# Patient Record
Sex: Female | Born: 1960 | Race: White | Hispanic: No | Smoking: Former smoker
Health system: Southern US, Community
[De-identification: ages and names within clinical notes are randomized; demographics above are authoritative.]

## PROBLEM LIST (undated history)

## (undated) DIAGNOSIS — M1990 Unspecified osteoarthritis, unspecified site: Secondary | ICD-10-CM

## (undated) DIAGNOSIS — K760 Fatty (change of) liver, not elsewhere classified: Secondary | ICD-10-CM

## (undated) DIAGNOSIS — F419 Anxiety disorder, unspecified: Secondary | ICD-10-CM

## (undated) DIAGNOSIS — I7300 Raynaud's syndrome without gangrene: Secondary | ICD-10-CM

## (undated) DIAGNOSIS — S6292XA Unspecified fracture of left wrist and hand, initial encounter for closed fracture: Secondary | ICD-10-CM

## (undated) DIAGNOSIS — R002 Palpitations: Secondary | ICD-10-CM

## (undated) DIAGNOSIS — R202 Paresthesia of skin: Secondary | ICD-10-CM

## (undated) DIAGNOSIS — G4700 Insomnia, unspecified: Secondary | ICD-10-CM

## (undated) DIAGNOSIS — G43909 Migraine, unspecified, not intractable, without status migrainosus: Secondary | ICD-10-CM

## (undated) DIAGNOSIS — H6690 Otitis media, unspecified, unspecified ear: Secondary | ICD-10-CM

## (undated) DIAGNOSIS — E785 Hyperlipidemia, unspecified: Secondary | ICD-10-CM

## (undated) DIAGNOSIS — G4733 Obstructive sleep apnea (adult) (pediatric): Secondary | ICD-10-CM

## (undated) DIAGNOSIS — M8700 Idiopathic aseptic necrosis of unspecified bone: Secondary | ICD-10-CM

## (undated) DIAGNOSIS — M797 Fibromyalgia: Secondary | ICD-10-CM

## (undated) DIAGNOSIS — R200 Anesthesia of skin: Secondary | ICD-10-CM

## (undated) DIAGNOSIS — H269 Unspecified cataract: Secondary | ICD-10-CM

## (undated) DIAGNOSIS — M549 Dorsalgia, unspecified: Secondary | ICD-10-CM

## (undated) DIAGNOSIS — E559 Vitamin D deficiency, unspecified: Secondary | ICD-10-CM

## (undated) DIAGNOSIS — K635 Polyp of colon: Secondary | ICD-10-CM

## (undated) DIAGNOSIS — E538 Deficiency of other specified B group vitamins: Secondary | ICD-10-CM

## (undated) DIAGNOSIS — F32A Depression, unspecified: Secondary | ICD-10-CM

## (undated) DIAGNOSIS — G629 Polyneuropathy, unspecified: Secondary | ICD-10-CM

## (undated) DIAGNOSIS — E669 Obesity, unspecified: Secondary | ICD-10-CM

## (undated) DIAGNOSIS — H409 Unspecified glaucoma: Secondary | ICD-10-CM

## (undated) DIAGNOSIS — K219 Gastro-esophageal reflux disease without esophagitis: Secondary | ICD-10-CM

## (undated) DIAGNOSIS — R1310 Dysphagia, unspecified: Secondary | ICD-10-CM

## (undated) DIAGNOSIS — K0889 Other specified disorders of teeth and supporting structures: Secondary | ICD-10-CM

## (undated) DIAGNOSIS — Z9889 Other specified postprocedural states: Secondary | ICD-10-CM

## (undated) DIAGNOSIS — T7840XA Allergy, unspecified, initial encounter: Secondary | ICD-10-CM

## (undated) DIAGNOSIS — T4145XA Adverse effect of unspecified anesthetic, initial encounter: Secondary | ICD-10-CM

## (undated) DIAGNOSIS — T8859XA Other complications of anesthesia, initial encounter: Secondary | ICD-10-CM

## (undated) DIAGNOSIS — R0602 Shortness of breath: Secondary | ICD-10-CM

## (undated) DIAGNOSIS — G35D Multiple sclerosis, unspecified: Secondary | ICD-10-CM

## (undated) DIAGNOSIS — G709 Myoneural disorder, unspecified: Secondary | ICD-10-CM

## (undated) DIAGNOSIS — H5120 Internuclear ophthalmoplegia, unspecified eye: Secondary | ICD-10-CM

## (undated) DIAGNOSIS — G4730 Sleep apnea, unspecified: Secondary | ICD-10-CM

## (undated) DIAGNOSIS — K5790 Diverticulosis of intestine, part unspecified, without perforation or abscess without bleeding: Secondary | ICD-10-CM

## (undated) DIAGNOSIS — H469 Unspecified optic neuritis: Secondary | ICD-10-CM

## (undated) DIAGNOSIS — R42 Dizziness and giddiness: Secondary | ICD-10-CM

## (undated) DIAGNOSIS — R079 Chest pain, unspecified: Secondary | ICD-10-CM

## (undated) DIAGNOSIS — R600 Localized edema: Secondary | ICD-10-CM

## (undated) DIAGNOSIS — I38 Endocarditis, valve unspecified: Secondary | ICD-10-CM

## (undated) DIAGNOSIS — M799 Soft tissue disorder, unspecified: Secondary | ICD-10-CM

## (undated) DIAGNOSIS — M2550 Pain in unspecified joint: Secondary | ICD-10-CM

## (undated) DIAGNOSIS — R7303 Prediabetes: Secondary | ICD-10-CM

## (undated) DIAGNOSIS — Z91018 Allergy to other foods: Secondary | ICD-10-CM

## (undated) DIAGNOSIS — R5382 Chronic fatigue, unspecified: Secondary | ICD-10-CM

## (undated) DIAGNOSIS — C801 Malignant (primary) neoplasm, unspecified: Secondary | ICD-10-CM

## (undated) DIAGNOSIS — N760 Acute vaginitis: Secondary | ICD-10-CM

## (undated) DIAGNOSIS — G9332 Myalgic encephalomyelitis/chronic fatigue syndrome: Secondary | ICD-10-CM

## (undated) DIAGNOSIS — E739 Lactose intolerance, unspecified: Secondary | ICD-10-CM

## (undated) DIAGNOSIS — G35 Multiple sclerosis: Secondary | ICD-10-CM

## (undated) HISTORY — DX: Unspecified fracture of left hand, initial encounter for closed fracture: S62.92XA

## (undated) HISTORY — PX: BREAST BIOPSY: SHX20

## (undated) HISTORY — DX: Palpitations: R00.2

## (undated) HISTORY — DX: Other specified disorders of teeth and supporting structures: K08.89

## (undated) HISTORY — DX: Vitamin D deficiency, unspecified: E55.9

## (undated) HISTORY — DX: Anxiety disorder, unspecified: F41.9

## (undated) HISTORY — DX: Dizziness and giddiness: R42

## (undated) HISTORY — DX: Unspecified glaucoma: H40.9

## (undated) HISTORY — DX: Idiopathic aseptic necrosis of unspecified bone: M87.00

## (undated) HISTORY — DX: Allergy, unspecified, initial encounter: T78.40XA

## (undated) HISTORY — DX: Unspecified optic neuritis: H46.9

## (undated) HISTORY — DX: Polyneuropathy, unspecified: G62.9

## (undated) HISTORY — DX: Lactose intolerance, unspecified: E73.9

## (undated) HISTORY — DX: Insomnia, unspecified: G47.00

## (undated) HISTORY — DX: Fatty (change of) liver, not elsewhere classified: K76.0

## (undated) HISTORY — DX: Pain in unspecified joint: M25.50

## (undated) HISTORY — DX: Myalgic encephalomyelitis/chronic fatigue syndrome: G93.32

## (undated) HISTORY — DX: Anesthesia of skin: R20.2

## (undated) HISTORY — DX: Prediabetes: R73.03

## (undated) HISTORY — DX: Dysphagia, unspecified: R13.10

## (undated) HISTORY — DX: Malignant (primary) neoplasm, unspecified: C80.1

## (undated) HISTORY — DX: Multiple sclerosis: G35

## (undated) HISTORY — DX: Sleep apnea, unspecified: G47.30

## (undated) HISTORY — DX: Myoneural disorder, unspecified: G70.9

## (undated) HISTORY — DX: Internuclear ophthalmoplegia, unspecified eye: H51.20

## (undated) HISTORY — DX: Raynaud's syndrome without gangrene: I73.00

## (undated) HISTORY — DX: Allergy to other foods: Z91.018

## (undated) HISTORY — DX: Dorsalgia, unspecified: M54.9

## (undated) HISTORY — DX: Localized edema: R60.0

## (undated) HISTORY — DX: Gastro-esophageal reflux disease without esophagitis: K21.9

## (undated) HISTORY — PX: CHOLECYSTECTOMY: SHX55

## (undated) HISTORY — PX: COLONOSCOPY: SHX174

## (undated) HISTORY — PX: LAPAROSCOPIC ABDOMINAL EXPLORATION: SHX6249

## (undated) HISTORY — PX: ENDOMETRIAL ABLATION: SHX621

## (undated) HISTORY — DX: Obstructive sleep apnea (adult) (pediatric): G47.33

## (undated) HISTORY — DX: Shortness of breath: R06.02

## (undated) HISTORY — DX: Chest pain, unspecified: R07.9

## (undated) HISTORY — DX: Unspecified cataract: H26.9

## (undated) HISTORY — DX: Unspecified osteoarthritis, unspecified site: M19.90

## (undated) HISTORY — PX: TOTAL HIP ARTHROPLASTY: SHX124

## (undated) HISTORY — DX: Endocarditis, valve unspecified: I38

## (undated) HISTORY — DX: Migraine, unspecified, not intractable, without status migrainosus: G43.909

## (undated) HISTORY — DX: Fibromyalgia: M79.7

## (undated) HISTORY — DX: Multiple sclerosis, unspecified: G35.D

## (undated) HISTORY — DX: Deficiency of other specified B group vitamins: E53.8

## (undated) HISTORY — DX: Otitis media, unspecified, unspecified ear: H66.90

## (undated) HISTORY — DX: Chronic fatigue, unspecified: R53.82

## (undated) HISTORY — DX: Depression, unspecified: F32.A

## (undated) HISTORY — DX: Obesity, unspecified: E66.9

## (undated) HISTORY — PX: BREAST EXCISIONAL BIOPSY: SUR124

## (undated) HISTORY — DX: Hyperlipidemia, unspecified: E78.5

## (undated) HISTORY — DX: Acute vaginitis: N76.0

## (undated) HISTORY — DX: Anesthesia of skin: R20.0

## (undated) HISTORY — PX: JOINT REPLACEMENT: SHX530

## (undated) HISTORY — DX: Soft tissue disorder, unspecified: M79.9

## (undated) HISTORY — PX: DILATION AND CURETTAGE OF UTERUS: SHX78

## (undated) HISTORY — DX: Diverticulosis of intestine, part unspecified, without perforation or abscess without bleeding: K57.90

## (undated) HISTORY — PX: EYE SURGERY: SHX253

## (undated) HISTORY — PX: LUMBAR PUNCTURE: SHX1985

---
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
## 1997-12-23 ENCOUNTER — Ambulatory Visit (HOSPITAL_COMMUNITY): Admission: RE | Admit: 1997-12-23 | Discharge: 1997-12-23 | Payer: Self-pay | Admitting: *Deleted

## 1998-03-07 ENCOUNTER — Ambulatory Visit: Admission: RE | Admit: 1998-03-07 | Discharge: 1998-03-07 | Payer: Self-pay | Admitting: Neurology

## 1999-03-23 ENCOUNTER — Encounter: Admission: RE | Admit: 1999-03-23 | Discharge: 1999-06-21 | Payer: Self-pay | Admitting: Neurology

## 2000-06-17 ENCOUNTER — Encounter: Admission: RE | Admit: 2000-06-17 | Discharge: 2000-09-15 | Payer: Self-pay | Admitting: *Deleted

## 2001-04-25 ENCOUNTER — Other Ambulatory Visit: Admission: RE | Admit: 2001-04-25 | Discharge: 2001-04-25 | Payer: Self-pay | Admitting: *Deleted

## 2002-08-10 ENCOUNTER — Encounter: Admission: RE | Admit: 2002-08-10 | Discharge: 2002-08-10 | Payer: Self-pay | Admitting: Internal Medicine

## 2002-08-10 ENCOUNTER — Encounter: Payer: Self-pay | Admitting: Internal Medicine

## 2003-04-22 ENCOUNTER — Inpatient Hospital Stay (HOSPITAL_COMMUNITY): Admission: RE | Admit: 2003-04-22 | Discharge: 2003-04-25 | Payer: Self-pay | Admitting: Orthopedic Surgery

## 2004-09-28 ENCOUNTER — Other Ambulatory Visit: Admission: RE | Admit: 2004-09-28 | Discharge: 2004-09-28 | Payer: Self-pay | Admitting: Obstetrics and Gynecology

## 2007-08-22 ENCOUNTER — Emergency Department (HOSPITAL_COMMUNITY): Admission: EM | Admit: 2007-08-22 | Discharge: 2007-08-22 | Payer: Self-pay | Admitting: Emergency Medicine

## 2008-01-12 ENCOUNTER — Encounter: Admission: RE | Admit: 2008-01-12 | Discharge: 2008-01-12 | Payer: Self-pay | Admitting: Internal Medicine

## 2008-12-02 ENCOUNTER — Encounter: Admission: RE | Admit: 2008-12-02 | Discharge: 2008-12-02 | Payer: Self-pay | Admitting: Obstetrics and Gynecology

## 2008-12-11 ENCOUNTER — Encounter (INDEPENDENT_AMBULATORY_CARE_PROVIDER_SITE_OTHER): Payer: Self-pay | Admitting: Diagnostic Radiology

## 2008-12-11 ENCOUNTER — Encounter: Admission: RE | Admit: 2008-12-11 | Discharge: 2008-12-11 | Payer: Self-pay | Admitting: Obstetrics and Gynecology

## 2009-01-21 ENCOUNTER — Encounter: Admission: RE | Admit: 2009-01-21 | Discharge: 2009-01-21 | Payer: Self-pay | Admitting: Surgery

## 2009-01-21 ENCOUNTER — Encounter (INDEPENDENT_AMBULATORY_CARE_PROVIDER_SITE_OTHER): Payer: Self-pay | Admitting: Surgery

## 2009-01-21 ENCOUNTER — Ambulatory Visit (HOSPITAL_COMMUNITY): Admission: RE | Admit: 2009-01-21 | Discharge: 2009-01-21 | Payer: Self-pay | Admitting: Surgery

## 2009-09-23 ENCOUNTER — Encounter (INDEPENDENT_AMBULATORY_CARE_PROVIDER_SITE_OTHER): Payer: Self-pay | Admitting: *Deleted

## 2009-12-29 ENCOUNTER — Encounter: Admission: RE | Admit: 2009-12-29 | Discharge: 2009-12-29 | Payer: Self-pay | Admitting: Obstetrics and Gynecology

## 2010-01-02 ENCOUNTER — Encounter: Admission: RE | Admit: 2010-01-02 | Discharge: 2010-01-02 | Payer: Self-pay | Admitting: Obstetrics and Gynecology

## 2010-05-10 HISTORY — PX: ENDOMETRIAL ABLATION: SHX621

## 2010-05-31 ENCOUNTER — Encounter: Payer: Self-pay | Admitting: Obstetrics and Gynecology

## 2010-06-01 ENCOUNTER — Other Ambulatory Visit: Payer: Self-pay | Admitting: Dermatology

## 2010-06-11 NOTE — Letter (Signed)
Summary: Colonoscopy Date Change Letter  [facility]  [address] [place], [place] [address]   Phone: [phone]  Fax: [phone]      Sep 23, 2009 MRN: [id number]   Grace [name] [address] [place], [place]  [address]   Dear [name],   Previously you were recommended to have a repeat colonoscopy around this time. Your chart was recently reviewed by Dr. Claudette Head of [facility]. Follow up colonoscopy is now recommended in December 2014. This revised recommendation is based on current, nationally recognized guidelines for colorectal cancer screening and polyp surveillance. These guidelines are endorsed by the American Cancer Society, [employer] on Colorectal Cancer as well as numerous other major medical organizations.  Please understand that our recommendation assumes that you do not have any new symptoms such as bleeding, a change in bowel habits, anemia, or significant abdominal discomfort. If you do have any concerning GI symptoms or want to discuss the guideline recommendations, please call to arrange an office visit at your earliest convenience. Otherwise we will keep you in our reminder system and contact you 1-2 months prior to the date listed above to schedule your next colonoscopy.  Thank you,  Judie Petit T. Russella Dar, M.D.  [facility] [phone]

## 2010-08-14 LAB — COMPREHENSIVE METABOLIC PANEL
ALT: 15 U/L (ref 0–35)
AST: 16 U/L (ref 0–37)
Albumin: 4.1 g/dL (ref 3.5–5.2)
Alkaline Phosphatase: 66 U/L (ref 39–117)
BUN: 13 mg/dL (ref 6–23)
CO2: 23 mEq/L (ref 19–32)
Calcium: 9.5 mg/dL (ref 8.4–10.5)
Chloride: 107 mEq/L (ref 96–112)
Creatinine, Ser: 0.7 mg/dL (ref 0.4–1.2)
GFR calc Af Amer: >60 mL/min (ref >60)
GFR calc non Af Amer: >60 mL/min (ref >60)
Glucose, Bld: 112 mg/dL — ABNORMAL HIGH (ref 70–99)
Potassium: 4.3 mEq/L (ref 3.5–5.1)
Sodium: 137 mEq/L (ref 135–145)
Total Bilirubin: 0.6 mg/dL (ref 0.3–1.2)
Total Protein: 7 g/dL (ref 6.0–8.3)

## 2010-08-14 LAB — DIFFERENTIAL
Basophils Absolute: 0 10*3/uL (ref 0.0–0.1)
Basophils Relative: 1 % (ref 0–1)
Eosinophils Absolute: 0.2 10*3/uL (ref 0.0–0.7)
Eosinophils Relative: 2 % (ref 0–5)
Lymphocytes Relative: 32 % (ref 12–46)
Lymphs Abs: 2.8 10*3/uL (ref 0.7–4.0)
Monocytes Absolute: 0.6 10*3/uL (ref 0.1–1.0)
Monocytes Relative: 7 % (ref 3–12)
Neutro Abs: 5.1 10*3/uL (ref 1.7–7.7)
Neutrophils Relative %: 58 % (ref 43–77)

## 2010-08-14 LAB — CBC
HCT: 39.4 % (ref 36.0–46.0)
Hemoglobin: 13.6 g/dL (ref 12.0–15.0)
MCHC: 34.5 g/dL (ref 30.0–36.0)
MCV: 95.3 fL (ref 78.0–100.0)
Platelets: 298 10*3/uL (ref 150–400)
RBC: 4.13 MIL/uL (ref 3.87–5.11)
RDW: 13.4 % (ref 11.5–15.5)
WBC: 8.8 10*3/uL (ref 4.0–10.5)

## 2010-09-25 NOTE — Op Note (Signed)
NAME:  DUCHESS, ARMENDAREZ NO.:  [id number]   MEDICAL RECORD NO.:  [id number]                   PATIENT TYPE:  INP   LOCATION:  2907                                 FACILITY:  [facility]   PHYSICIAN:  Elana Alm. Thurston Hole, M.D.              DATE OF BIRTH:  17-Aug-1960   DATE OF PROCEDURE:  04/22/2003  DATE OF DISCHARGE:                                 OPERATIVE REPORT   PREOPERATIVE DIAGNOSIS:  Right hip avascular necrosis.   POSTOPERATIVE DIAGNOSIS:  Right hip avascular necrosis.   PROCEDURE:  Right total hip replacement using [employer] ceramic total hip  system with 50 mm Press Fit Trident acetabulum with ceramic liner with #8  Press Fit, Secure-Fit Plus, femoral component with +0 x 30 mm ceramic head.   SURGEON:  Elana Alm. Thurston Hole, M.D.   ASSISTANT:  Julien Girt, P.A.   ANESTHESIA:  General.   OPERATIVE TIME:  One hour 20 minutes.   ESTIMATED BLOOD LOSS:  350 mL.   COMPLICATIONS:  None.   DESCRIPTION OF PROCEDURE:  Ms. Gorczyca was brought to the operating room on  April 22, 2003, placed on the operative table in the supine position.  After an adequate level of general anesthesia was obtained, her right hip  was examined.  She had flexion to 90, extension to 0, internal and external  rotation to 30 degrees.  Leg lengths were approximately equal.  She had a  Foley catheter placed under sterile conditions and received Ancef 1 g IV  preoperatively for prophylaxis.  She was then placed in the right lateral up  decubitus position and secured on the bed with the [place] frame.  The right  hip and leg was prepped using sterile DuraPrep and draped using sterile  technique.  Initially through a 15 cm posterior lateral greater trochanteric  incision, initial exposure was made.  The underlying subcutaneous tissues  were incised along with the skin incision.  The iliotibial band and gluteus  maximus fascia was incised longitudinally revealing the sciatic nerve  which  was carefully protected.  Short external rotators of the hip and hip capsule  were released off the femoral neck insertions and tagged.  The hip was then  posteriorly dislocated.  The femoral head was found to be somewhat soft with  grade 3 and 4 changes consistent with avascular necrosis.  A femoral neck  cut was made 1.2-2.0 cm above the lesser trochanter and the appropriate  amount of anteversion and inclination.  The acetabulum was then exposed.  The labrum was removed from around the edge of the acetabulum with carefully  placed retractors.  Sequential acetabular reamers were used to ream up to a  50 mm size and the appropriate amount of anteversion and abduction and  inclination.  Then a 50 mm trial was placed.  This was found to give an  excellent fit, and this was removed and then the  actual 50 mm PSL.  A  Trident cup was hammered into position with an excellent [employer], in the  appropriate amount of anteversion and abduction and inclination.  Further  fixation was used with two separate 20 mm screws, one in the 12 o'clock and  one in the 11 o'clock position.  After this was done, then a 0 degree  ceramic insert was placed and hammered into position with an excellent fit.  After this was done, the proximal femur was exposed.  Sequential axial  reamers were used to ream up to a #8 size followed by broaches to a #8 size.  With a #8 broach in place and a +0 x 32 mm femoral head, the femoral trial  was reduced, taken through a full range of motion and found to be stable up  to 80 degrees of internal rotation in both neutral and 30 degrees of  adduction and also stable in adduction and external rotation.  Leg lengths  were also equal.  The femoral trial was then removed.  The distal femoral  canal was reamed to a 12.5 mm size.  The femoral canal was then irrigated  and the actual #8 Secure Fit Plus component was hammered into position with  an excellent fit.  A +0 x 32 mm  ceramic head was hammered into the femoral  neck with an excellent morse taper fit.  The hip was reduced.  It was found  to be stable through a full range of motion.  The leg lengths were equal as  well.  At this point, the wound was irrigated and then the short external  rotator of the hip and hip capsule were closed and reattached to the femoral  neck insertion through two drill holes.  The iliotibial band and gluteus  maximus fascia was closed with #1 Ethibond sutures.  Subcutaneous tissue was  closed with 0 and 2-0 Vicryl.  Skin was closed with skin staples.  Sterile  dressings were applied.  The patient was then turned supine, checked for leg  lengths which were equal.  Rotation was equal.  Neurovascular status was  normal.  The patient was then awakened, extubated and taken to the recovery  room in stable condition.  Needle and sponge counts were correct x2 at the  end of the case.                                               Robert A. Thurston Hole, M.D.    RAW/MEDQ  D:  04/22/2003  T:  04/23/2003  Job:  [id number]

## 2010-09-25 NOTE — Discharge Summary (Signed)
NAME:  Grace [name], Grace [name] NO.:  [id number]   MEDICAL RECORD NO.:  [id number]                   PATIENT TYPE:  INP   LOCATION:  5004                                 FACILITY:  [facility]   PHYSICIAN:  Elana Alm. Thurston Hole, M.D.              DATE OF BIRTH:  01-31-1961   DATE OF ADMISSION:  04/22/2003  DATE OF DISCHARGE:  04/25/2003                                 DISCHARGE SUMMARY   ADMISSION DIAGNOSES:  1. Avascular necrosis right hip.  2. Multiple sclerosis.  3. Sleep apnea.  4. High cholesterol.   DISCHARGE DIAGNOSES:  1. Avascular necrosis status post right total hip replacement.  2. Multiple sclerosis.  3. Sleep apnea.  4. High cholesterol.   HISTORY OF PRESENT ILLNESS:  The patient is a 50 year old female who has  been on chronic steroid suppression of her multiple sclerosis who has gone  on to develop avascular necrosis of both hips.  She had a left total hip  replacement done at [facility] in June of 2002 and comes in today to have her  right total hip replaced.  She understands risks, benefits, and possible  complications of a total hip replacement and is without question.   PROCEDURES IN HOUSE:  On April 22, 2003, the patient underwent a right  total hip replacement by Dr. Thurston Hole.  She tolerated the procedure well.   HOSPITAL COURSE:  She was admitted postoperatively for pain control, DVT  prophylaxis, and physical therapy.  On postop day #1 T-max of 100.6, pulse  was 106, hemoglobin was 10.3, she was metabolically stable, her PCA was  discharged, her telemetry was discharged, she was placed on Dilaudid 2 mg  one to two q.4 h. p.r.n. pain.  She was transferred to the orthopedic floor.  Postop day #2 surgical wound was well approximated.  She had very little  drainage.  Physical therapy was ordered to have her up with ambulation.  Postop day #3 she was doing very well.  Her surgical wound was well  approximated and healing.  Her hemoglobin was 9.7, her  INR was 2.1 and she  was metabolically stable.  She was discharged to home in stable condition,  touchdown weightbearing, on a regular diet.   DISCHARGE MEDICATIONS:  1. Percocet one to two q.4-6 h. p.r.n. pain.  2. Robaxin 500 mg one q.6 h. p.r.n. spasms.  3. Coumadin 5 mg take as directed.  4. Ambien 10 mg one p.o. q.h.s.  5. Lipitor 10 mg one p.o. daily.  6. Neurontin 300 mg three times a day.  7. Benadryl 25 mg as needed for itching.   She has been instructed to call with increased temperature, increased pain,  increased drainage.  She will follow up on December 27th.      Kirstin Shepperson, P.A.                  Robert A. Thurston Hole,  M.D.    KS/MEDQ  D:  05/18/2003  T:  05/19/2003  Job:  [id number]

## 2011-01-12 ENCOUNTER — Other Ambulatory Visit: Payer: Self-pay | Admitting: Obstetrics and Gynecology

## 2011-01-12 DIAGNOSIS — Z1231 Encounter for screening mammogram for malignant neoplasm of breast: Secondary | ICD-10-CM

## 2011-01-13 ENCOUNTER — Other Ambulatory Visit: Payer: Self-pay | Admitting: Obstetrics and Gynecology

## 2011-01-13 DIAGNOSIS — Z96649 Presence of unspecified artificial hip joint: Secondary | ICD-10-CM

## 2011-01-13 DIAGNOSIS — G35D Multiple sclerosis, unspecified: Secondary | ICD-10-CM

## 2011-01-13 DIAGNOSIS — G35 Multiple sclerosis: Secondary | ICD-10-CM

## 2011-01-25 ENCOUNTER — Ambulatory Visit
Admission: RE | Admit: 2011-01-25 | Discharge: 2011-01-25 | Disposition: A | Discharge status: Home / Self Care | Payer: Medicare Other | Source: Ambulatory Visit | Attending: Obstetrics and Gynecology | Admitting: Obstetrics and Gynecology

## 2011-01-25 DIAGNOSIS — G35 Multiple sclerosis: Secondary | ICD-10-CM

## 2011-01-25 DIAGNOSIS — Z1231 Encounter for screening mammogram for malignant neoplasm of breast: Secondary | ICD-10-CM

## 2011-01-25 DIAGNOSIS — Z96649 Presence of unspecified artificial hip joint: Secondary | ICD-10-CM

## 2011-06-15 ENCOUNTER — Other Ambulatory Visit: Payer: Self-pay | Admitting: Cardiology

## 2011-06-15 ENCOUNTER — Encounter (HOSPITAL_COMMUNITY): Payer: Self-pay | Admitting: Pharmacy Technician

## 2011-06-15 ENCOUNTER — Ambulatory Visit
Admission: RE | Admit: 2011-06-15 | Discharge: 2011-06-15 | Disposition: A | Discharge status: Home / Self Care | Payer: Medicare Other | Source: Ambulatory Visit | Attending: Cardiology | Admitting: Cardiology

## 2011-06-16 ENCOUNTER — Other Ambulatory Visit: Payer: Self-pay | Admitting: Cardiology

## 2011-06-21 ENCOUNTER — Encounter (HOSPITAL_COMMUNITY)
Admission: RE | Disposition: A | Discharge status: Home / Self Care | Payer: Self-pay | Source: Ambulatory Visit | Attending: Cardiology

## 2011-06-21 ENCOUNTER — Ambulatory Visit (HOSPITAL_COMMUNITY)
Admission: RE | Admit: 2011-06-21 | Discharge: 2011-06-21 | Disposition: A | Discharge status: Home / Self Care | Payer: Medicare Other | Source: Ambulatory Visit | Attending: Cardiology | Admitting: Cardiology

## 2011-06-21 DIAGNOSIS — R079 Chest pain, unspecified: Secondary | ICD-10-CM | POA: Insufficient documentation

## 2011-06-21 DIAGNOSIS — R0602 Shortness of breath: Secondary | ICD-10-CM | POA: Insufficient documentation

## 2011-06-21 DIAGNOSIS — Q245 Malformation of coronary vessels: Secondary | ICD-10-CM | POA: Insufficient documentation

## 2011-06-21 HISTORY — PX: LEFT HEART CATHETERIZATION WITH CORONARY ANGIOGRAM: SHX5451

## 2011-06-21 SURGERY — LEFT HEART CATHETERIZATION WITH CORONARY ANGIOGRAM
Anesthesia: LOCAL

## 2011-06-21 MED ORDER — VERAPAMIL HCL 2.5 MG/ML IV SOLN
INTRAVENOUS | Status: AC
  Filled 2011-06-21: qty 2

## 2011-06-21 MED ORDER — HEPARIN SODIUM (PORCINE) 1000 UNIT/ML IJ SOLN
INTRAMUSCULAR | Status: AC
  Filled 2011-06-21: qty 1

## 2011-06-21 MED ORDER — FENTANYL CITRATE 0.05 MG/ML IJ SOLN
INTRAMUSCULAR | Status: AC
  Filled 2011-06-21: qty 2

## 2011-06-21 MED ORDER — RIZATRIPTAN BENZOATE 10 MG PO TBDP: 10.0000 mg | ORAL_TABLET | Freq: Once | ORAL | Status: DC | PRN

## 2011-06-21 MED ORDER — HEPARIN (PORCINE) IN NACL 2-0.9 UNIT/ML-% IJ SOLN
INTRAMUSCULAR | Status: AC
  Filled 2011-06-21: qty 2000

## 2011-06-21 MED ORDER — ACETAMINOPHEN 325 MG PO TABS: 650.0000 mg | ORAL_TABLET | ORAL | Status: DC | PRN

## 2011-06-21 MED ORDER — ACETAMINOPHEN 325 MG PO TABS
ORAL_TABLET | ORAL | Status: AC
  Filled 2011-06-21: qty 2

## 2011-06-21 MED ORDER — NITROGLYCERIN 0.2 MG/ML ON CALL CATH LAB
INTRAVENOUS | Status: AC
  Filled 2011-06-21: qty 1

## 2011-06-21 MED ORDER — SODIUM CHLORIDE 0.9 % IV SOLN
INTRAVENOUS | Status: DC
  Administered 2011-06-21: 1000 mL via INTRAVENOUS

## 2011-06-21 MED ORDER — BACLOFEN 20 MG PO TABS: 20.0000 mg | ORAL_TABLET | Freq: Three times a day (TID) | ORAL | Status: DC

## 2011-06-21 MED ORDER — CARISOPRODOL 350 MG PO TABS: 350.0000 mg | ORAL_TABLET | Freq: Every day | ORAL | Status: DC

## 2011-06-21 MED ORDER — HYDROCODONE-ACETAMINOPHEN 5-325 MG PO TABS
ORAL_TABLET | ORAL | Status: AC
  Filled 2011-06-21: qty 1

## 2011-06-21 MED ORDER — SODIUM CHLORIDE 0.9 % IV SOLN: INTRAVENOUS | Status: DC

## 2011-06-21 MED ORDER — DIAZEPAM 5 MG PO TABS
5.0000 mg | ORAL_TABLET | ORAL | Status: AC
  Administered 2011-06-21: 5 mg via ORAL
  Filled 2011-06-21: qty 1

## 2011-06-21 MED ORDER — SODIUM CHLORIDE 0.9 % IJ SOLN: 3.0000 mL | INTRAMUSCULAR | Status: DC | PRN

## 2011-06-21 MED ORDER — MIDAZOLAM HCL 2 MG/2ML IJ SOLN
INTRAMUSCULAR | Status: AC
  Filled 2011-06-21: qty 2

## 2011-06-21 MED ORDER — ALPRAZOLAM 0.25 MG PO TABS
ORAL_TABLET | ORAL | Status: AC
  Filled 2011-06-21: qty 1

## 2011-06-21 MED ORDER — SODIUM CHLORIDE 0.9 % IV SOLN: 1.0000 mL/kg/h | INTRAVENOUS | Status: DC

## 2011-06-21 MED ORDER — ALPRAZOLAM 0.25 MG PO TABS
0.2500 mg | ORAL_TABLET | Freq: Two times a day (BID) | ORAL | Status: DC | PRN
  Administered 2011-06-21: 0.25 mg via ORAL

## 2011-06-21 MED ORDER — AMANTADINE HCL 100 MG PO CAPS: 100.0000 mg | ORAL_CAPSULE | Freq: Two times a day (BID) | ORAL | Status: DC

## 2011-06-21 MED ORDER — ASPIRIN 81 MG PO CHEW
324.0000 mg | CHEWABLE_TABLET | ORAL | Status: AC
  Administered 2011-06-21: 324 mg via ORAL
  Filled 2011-06-21: qty 4

## 2011-06-21 MED ORDER — LIDOCAINE HCL (PF) 1 % IJ SOLN
INTRAMUSCULAR | Status: AC
  Filled 2011-06-21: qty 30

## 2011-06-21 MED ORDER — HYDROCODONE-ACETAMINOPHEN 5-325 MG PO TABS
1.0000 | ORAL_TABLET | ORAL | Status: DC | PRN
  Administered 2011-06-21: 1 via ORAL

## 2011-06-21 MED ORDER — ONDANSETRON HCL 4 MG/2ML IJ SOLN: 4.0000 mg | Freq: Four times a day (QID) | INTRAMUSCULAR | Status: DC | PRN

## 2011-06-21 NOTE — Brief Op Note (Signed)
06/21/2011  12:13 PM  PATIENT:  Grace [name]  51 y.o. female followed by Dr. Marylin Crosby who is having exertional chest pain or shortness of breath over last couple months. And was concerned this is potentially anginal in nature. She had a previous nuclear stress test which was read as no evidence of ischemia. He was reluctant to do another stress test as is his concern is his anginal equivalent and therefore is referred for diagnostic cardiac catheterization.  Procedure(s): LEFT HEART CATHETERIZATION WITH CORONARY ANGIOGRAM  PRE-OPERATIVE DIAGNOSIS:  Chest pain  POST-OPERATIVE DIAGNOSIS:   Hemodynamics:  Aortic pressure 110/74 mm Hg; 91 mm Hg mean  Left blood pressure: 110/70 mm Hg; 10 mm Hg LVEDP   Left Ventriculography: Not adequate visualization is a use a 4 [place] catheter but there did not appear to be any decrease ejection fraction or motion mildly. Likely 55%  Right coronary is dominant moderate caliber vessel branches into a long extensive PDA the almost accident wraparound PDA. It is also a large posterior lateral branch and several smaller branches. The RCA is free of the disease. 2 artery marginal branches one is small the wasn't in the midportion is a major vessel but small in caliber.  Left main large caliber trifurcates into LAD ramus intermedius and circumflex. Nose no disease.  LAD starts off as a moderate to large caliber vessel it gives rise to a significant but branching septal perforator and a large branching diagonal vessel and after this branch it actually tapers into a smaller caliber vessel down and does not reach the apex. The diagonal vessel is also free of disease and is larger than the LAD after that after the septal perforators. Otherwise the LAD system is angiographic normal.   Left Circumflex is a nondominant vessel is of small AV groove branch and there is a proximal branching OM and the and main circumflex becomes a EOMs 2 or posterior lateral branch. It  tapers down toward the base. There is no significant disease the circumflex system. Mild myocardial bridging noted.  Ramus intermedius is at least as large as the LAD and circumflex courses in the looked like it OM distribution of branches down in the near the apex. No angiographic evidence of significant disease.  Surgeon(s): Marykay Lex, MD  ANESTHESIA:   local and IV sedation; 1 mg Versed, 25 mcg Fentanyl, 25 mg Benadryl  EBL:   < 10 mL  Equipment:   5 [place] radial glide sheath  5 [place] TIG 4.0 -- RCA  5 [place] JL 3.5 -- LCA  4 French angled pigtail --LV gram  LOCAL MEDICATIONS USED:  LIDOCAINE 2 mL Other medications:  IV Heparin 4500 Units Radial Cocktail: 5 mg Verapamil, 400 mcg NTG, 2 ml 2% Lidocaine  TOURNIQUET:  TR band -- 10 mL air, 12:05 PM   DICTATION: .Note written in EPIC  PLAN OF CARE: Discharge to home after PACU  Continue home medications.  Followup with Dr. Clarene Duke who is aware of the results.   PATIENT DISPOSITION:  PACU - hemodynamically stable.   Delay start of Pharmacological VTE agent (>24hrs) due to surgical blood loss or risk of bleeding: not applicable

## 2011-06-21 NOTE — H&P (Addendum)
  History and Physical Interval Note:  NAME:  Grace [name]   MRN: [id number] DOB:  Dec 04, 1960   ADMIT DATE: 06/21/2011   06/21/2011 9:03 AM  [name] is a 51 y.o. female with a history of MS and exertional chest pain.  13 months ago, she underwent a Myoview ST that was negative for ischemia, however, she continues to have exertional chest discomfort.  Over the past 3 months, it has been a different type of pain - described as pressure with associated dyspnea that take supto 30 min to ease off.  She was seen by Dr. Clarene Duke in the office, and he was concerned that her symptoms are worrisome for angina.   He has asked that I perform cardiac catheterization to investigate further.  Mrs. Bensinger has presented today for invasive cardiac evaluation, with the diagnosis of exertional chest pain / Angina.  The various methods of treatment have been discussed with the patient and family. After consideration of risks (delineated below), benefits and other options for treatment, the patient has consented to Procedure(s):  LEFT HEART CATHETERIZATION AND CORONARY ANGIOGRAPHY +/- AD HOC PERCUTANEOUS CORONARY INTERVENTION as a surgical intervention.   Risks / Complications include, but not limited to: Death, MI, CVA/TIA, VF/VT (with defibrillation), Bradycardia (need for temporary pacer placement), contrast induced nephropathy, bleeding / bruising / hematoma / pseudoaneurysm, vascular or coronary injury (with possible emergent CT or Vascular Surgery), adverse medication reactions, infection.   The patients' history has been reviewed, patient examined, no change in status from most recent clinic note from Dr. Clarene Duke, dated 06/15/11.  She remains stable for her procedure. I have reviewed the patients' chart and labs. Questions were answered to the patient's satisfaction.    Ajai Harville W [facility] [address] [place], [place]  [address]  [phone]  06/21/2011 9:03  AM

## 2011-07-07 ENCOUNTER — Other Ambulatory Visit (HOSPITAL_COMMUNITY): Payer: Self-pay | Admitting: Cardiology

## 2011-07-07 DIAGNOSIS — R0602 Shortness of breath: Secondary | ICD-10-CM

## 2011-07-09 ENCOUNTER — Ambulatory Visit (HOSPITAL_COMMUNITY)
Admission: RE | Admit: 2011-07-09 | Discharge: 2011-07-09 | Disposition: A | Discharge status: Home / Self Care | Payer: Medicare Other | Source: Ambulatory Visit | Attending: Cardiology | Admitting: Cardiology

## 2011-07-09 DIAGNOSIS — Z9089 Acquired absence of other organs: Secondary | ICD-10-CM | POA: Insufficient documentation

## 2011-07-09 DIAGNOSIS — R079 Chest pain, unspecified: Secondary | ICD-10-CM | POA: Insufficient documentation

## 2011-07-09 DIAGNOSIS — R0602 Shortness of breath: Secondary | ICD-10-CM

## 2011-07-09 MED ORDER — IOHEXOL 350 MG/ML SOLN
100.0000 mL | Freq: Once | INTRAVENOUS | Status: AC | PRN
  Administered 2011-07-09: 100 mL via INTRAVENOUS

## 2011-12-06 ENCOUNTER — Other Ambulatory Visit: Payer: Self-pay | Admitting: Internal Medicine

## 2011-12-06 ENCOUNTER — Other Ambulatory Visit: Payer: Self-pay | Admitting: Obstetrics and Gynecology

## 2011-12-06 DIAGNOSIS — Z1231 Encounter for screening mammogram for malignant neoplasm of breast: Secondary | ICD-10-CM

## 2012-01-31 ENCOUNTER — Ambulatory Visit
Admission: RE | Admit: 2012-01-31 | Discharge: 2012-01-31 | Disposition: A | Discharge status: Home / Self Care | Payer: Medicare Other | Source: Ambulatory Visit | Attending: Internal Medicine | Admitting: Internal Medicine

## 2012-01-31 DIAGNOSIS — Z1231 Encounter for screening mammogram for malignant neoplasm of breast: Secondary | ICD-10-CM

## 2013-01-03 ENCOUNTER — Other Ambulatory Visit: Payer: Self-pay | Admitting: Internal Medicine

## 2013-01-03 DIAGNOSIS — N644 Mastodynia: Secondary | ICD-10-CM

## 2013-01-31 ENCOUNTER — Encounter: Payer: Self-pay | Admitting: Gastroenterology

## 2013-01-31 ENCOUNTER — Ambulatory Visit
Admission: RE | Admit: 2013-01-31 | Discharge: 2013-01-31 | Disposition: A | Discharge status: Home / Self Care | Payer: Medicare Other | Source: Ambulatory Visit | Attending: Internal Medicine | Admitting: Internal Medicine

## 2013-01-31 DIAGNOSIS — N644 Mastodynia: Secondary | ICD-10-CM

## 2013-02-13 ENCOUNTER — Encounter: Payer: Self-pay | Admitting: Gastroenterology

## 2013-04-17 ENCOUNTER — Ambulatory Visit (AMBULATORY_SURGERY_CENTER): Payer: Medicare Other

## 2013-04-17 VITALS — Ht 65.5 in | Wt 196.6 lb

## 2013-04-17 DIAGNOSIS — Z800 Family history of malignant neoplasm of digestive organs: Secondary | ICD-10-CM

## 2013-04-17 DIAGNOSIS — Z86010 Personal history of colonic polyps: Secondary | ICD-10-CM

## 2013-04-17 MED ORDER — MOVIPREP 100 G PO SOLR: ORAL | Status: DC

## 2013-04-18 ENCOUNTER — Encounter: Payer: Self-pay | Admitting: Gastroenterology

## 2013-05-01 ENCOUNTER — Encounter: Payer: Self-pay | Admitting: Gastroenterology

## 2013-05-01 ENCOUNTER — Ambulatory Visit (AMBULATORY_SURGERY_CENTER): Payer: Medicare Other | Admitting: Gastroenterology

## 2013-05-01 VITALS — BP 128/69 | HR 56 | Temp 97.1°F | Resp 17 | Ht 65.0 in | Wt 196.0 lb

## 2013-05-01 DIAGNOSIS — D126 Benign neoplasm of colon, unspecified: Secondary | ICD-10-CM

## 2013-05-01 DIAGNOSIS — Z86010 Personal history of colonic polyps: Secondary | ICD-10-CM

## 2013-05-01 DIAGNOSIS — K639 Disease of intestine, unspecified: Secondary | ICD-10-CM

## 2013-05-01 DIAGNOSIS — Z1211 Encounter for screening for malignant neoplasm of colon: Secondary | ICD-10-CM

## 2013-05-01 DIAGNOSIS — K6389 Other specified diseases of intestine: Secondary | ICD-10-CM

## 2013-05-01 MED ORDER — SODIUM CHLORIDE 0.9 % IV SOLN: 500.0000 mL | INTRAVENOUS | Status: DC

## 2013-05-01 NOTE — Progress Notes (Signed)
Lidocaine-40mg IV prior to Propofol InductionPropofol given over incremental dosages 

## 2013-05-01 NOTE — Progress Notes (Signed)
Called to room to assist during endoscopic procedure.  Patient ID and intended procedure confirmed with present staff. Received instructions for my participation in the procedure from the performing physician.  

## 2013-05-01 NOTE — Op Note (Signed)
[facility] [address] [place], [address]   COLONOSCOPY PROCEDURE REPORT  PATIENT: Grace [name], Grace [name]  MR#: [id number] BIRTHDATE: [birth year] , 52  yrs. old GENDER: Female ENDOSCOPIST: Meryl Dare, MD, [facility] PROCEDURE DATE:  05/01/2013 PROCEDURE:   Colonoscopy with biopsy and snare polypectomy First Screening Colonoscopy - Avg.  risk and is 50 yrs.  old or older Yes.  Prior Negative Screening - Now for repeat screening. N/A  History of Adenoma - Now for follow-up colonoscopy & has been > or = to 3 yrs.  N/A  Polyps Removed Today? Yes. ASA CLASS:   Class II INDICATIONS:average risk screening. MEDICATIONS: MAC sedation, administered by CRNA and propofol (Diprivan) 300mg  IV DESCRIPTION OF PROCEDURE:   After the risks benefits and alternatives of the procedure were thoroughly explained, informed consent was obtained.  A digital rectal exam revealed no abnormalities of the rectum.   The LB [id number]  endoscope was introduced through the anus and advanced to the cecum, which was identified by both the appendix and ileocecal valve. No adverse events experienced.   The quality of the prep was good, using MoviPrep  The instrument was then slowly withdrawn as the colon was fully examined.  COLON FINDINGS: A sessile polyp measuring 6 mm in size was found in the distal transverse colon.  A polypectomy was performed with a cold snare.  The resection was complete and the polyp tissue was completely retrieved.   Mild, focal erythema was found in the proximal transverse colon.  Multiple biopsies of the area were performed.   Mild diverticulosis was noted in the transverse colon. The colon was otherwise normal.  There was no diverticulosis, inflammation, polyps or cancers unless previously stated. Retroflexed views revealed no abnormalities. The time to cecum=2 minutes 45 seconds.  Withdrawal time=11 minutes 45 seconds.  The scope was withdrawn and the procedure  completed. COMPLICATIONS: There were no complications.  ENDOSCOPIC IMPRESSION: 1.   Sessile polyp measuring 6 mm in the distal transverse colon; polypectomy performed with a cold snare 2.   Mild, focal erythema  in the proximal transverse colon; multiple biopsies performed 3.   Mild diverticulosis in the transverse colon  RECOMMENDATIONS: 1.  Await pathology results 2.  Repeat colonoscopy in 5 years if polyp adenomatous; otherwise 10 years  eSigned:  Meryl Dare, MD, [facility] 05/01/2013 10:27 AM   cc: [name], MD

## 2013-05-01 NOTE — Patient Instructions (Signed)

## 2013-05-02 ENCOUNTER — Telehealth: Payer: Self-pay | Admitting: *Deleted

## 2013-05-02 NOTE — Telephone Encounter (Signed)
  Follow up Call-  Call back number 05/01/2013  Post procedure Call Back phone  # [phone]  Permission to leave phone message Yes     Patient questions:  Do you have a fever, pain , or abdominal swelling? no Pain Score  0 *  Have you tolerated food without any problems? yes  Have you been able to return to your normal activities? yes  Do you have any questions about your discharge instructions: Diet   no Medications  no Follow up visit  no  Do you have questions or concerns about your Care? no  Actions: * If pain score is 4 or above: No action needed, pain <4.

## 2013-05-10 ENCOUNTER — Encounter: Payer: Self-pay | Admitting: Gastroenterology

## 2014-01-04 ENCOUNTER — Other Ambulatory Visit: Payer: Self-pay | Admitting: Internal Medicine

## 2014-01-04 DIAGNOSIS — Z1231 Encounter for screening mammogram for malignant neoplasm of breast: Secondary | ICD-10-CM

## 2014-02-01 ENCOUNTER — Ambulatory Visit: Payer: Medicare Other

## 2014-02-04 ENCOUNTER — Ambulatory Visit
Admission: RE | Admit: 2014-02-04 | Discharge: 2014-02-04 | Disposition: A | Discharge status: Home / Self Care | Payer: Commercial Managed Care - HMO | Source: Ambulatory Visit | Attending: Internal Medicine | Admitting: Internal Medicine

## 2014-02-04 DIAGNOSIS — Z1231 Encounter for screening mammogram for malignant neoplasm of breast: Secondary | ICD-10-CM

## 2014-02-06 ENCOUNTER — Other Ambulatory Visit: Payer: Self-pay | Admitting: Internal Medicine

## 2014-02-06 DIAGNOSIS — R928 Other abnormal and inconclusive findings on diagnostic imaging of breast: Secondary | ICD-10-CM

## 2014-02-12 ENCOUNTER — Other Ambulatory Visit: Payer: Commercial Managed Care - HMO

## 2014-02-13 ENCOUNTER — Ambulatory Visit
Admission: RE | Admit: 2014-02-13 | Discharge: 2014-02-13 | Disposition: A | Discharge status: Home / Self Care | Payer: Commercial Managed Care - HMO | Source: Ambulatory Visit | Attending: Internal Medicine | Admitting: Internal Medicine

## 2014-02-13 DIAGNOSIS — R928 Other abnormal and inconclusive findings on diagnostic imaging of breast: Secondary | ICD-10-CM

## 2014-04-18 ENCOUNTER — Encounter (HOSPITAL_COMMUNITY): Payer: Self-pay | Admitting: Cardiology

## 2014-06-26 ENCOUNTER — Encounter: Payer: Self-pay | Admitting: Neurology

## 2014-06-26 DIAGNOSIS — G373 Acute transverse myelitis in demyelinating disease of central nervous system: Secondary | ICD-10-CM | POA: Insufficient documentation

## 2014-06-26 DIAGNOSIS — R7989 Other specified abnormal findings of blood chemistry: Secondary | ICD-10-CM | POA: Insufficient documentation

## 2014-06-26 DIAGNOSIS — R260 Ataxic gait: Secondary | ICD-10-CM | POA: Insufficient documentation

## 2014-06-26 DIAGNOSIS — R208 Other disturbances of skin sensation: Secondary | ICD-10-CM | POA: Insufficient documentation

## 2014-06-26 DIAGNOSIS — F418 Other specified anxiety disorders: Secondary | ICD-10-CM | POA: Insufficient documentation

## 2014-06-26 DIAGNOSIS — R5383 Other fatigue: Secondary | ICD-10-CM | POA: Insufficient documentation

## 2014-06-26 DIAGNOSIS — G4700 Insomnia, unspecified: Secondary | ICD-10-CM | POA: Insufficient documentation

## 2014-06-26 DIAGNOSIS — G35 Multiple sclerosis: Secondary | ICD-10-CM | POA: Insufficient documentation

## 2014-06-27 ENCOUNTER — Encounter: Payer: Self-pay | Admitting: Neurology

## 2014-06-27 ENCOUNTER — Ambulatory Visit (INDEPENDENT_AMBULATORY_CARE_PROVIDER_SITE_OTHER): Payer: Commercial Managed Care - HMO | Admitting: Neurology

## 2014-06-27 VITALS — BP 136/74 | HR 78 | Resp 16 | Wt 198.2 lb

## 2014-06-27 DIAGNOSIS — F418 Other specified anxiety disorders: Secondary | ICD-10-CM | POA: Diagnosis not present

## 2014-06-27 DIAGNOSIS — G0489 Other myelitis: Secondary | ICD-10-CM | POA: Diagnosis not present

## 2014-06-27 DIAGNOSIS — G373 Acute transverse myelitis in demyelinating disease of central nervous system: Secondary | ICD-10-CM

## 2014-06-27 DIAGNOSIS — H469 Unspecified optic neuritis: Secondary | ICD-10-CM | POA: Diagnosis not present

## 2014-06-27 DIAGNOSIS — R260 Ataxic gait: Secondary | ICD-10-CM | POA: Diagnosis not present

## 2014-06-27 DIAGNOSIS — R7989 Other specified abnormal findings of blood chemistry: Secondary | ICD-10-CM | POA: Diagnosis not present

## 2014-06-27 DIAGNOSIS — R208 Other disturbances of skin sensation: Secondary | ICD-10-CM | POA: Diagnosis not present

## 2014-06-27 DIAGNOSIS — R5383 Other fatigue: Secondary | ICD-10-CM | POA: Diagnosis not present

## 2014-06-27 DIAGNOSIS — G35 Multiple sclerosis: Secondary | ICD-10-CM | POA: Diagnosis not present

## 2014-06-27 DIAGNOSIS — G4700 Insomnia, unspecified: Secondary | ICD-10-CM | POA: Diagnosis not present

## 2014-06-27 DIAGNOSIS — G35D Multiple sclerosis, unspecified: Secondary | ICD-10-CM

## 2014-06-27 MED ORDER — DIAZEPAM 5 MG PO TABS: 5.0000 mg | ORAL_TABLET | Freq: Two times a day (BID) | ORAL | Status: DC | PRN

## 2014-06-27 MED ORDER — HYDROCODONE-ACETAMINOPHEN 5-325 MG PO TABS: 1.0000 | ORAL_TABLET | Freq: Three times a day (TID) | ORAL | Status: DC | PRN

## 2014-06-27 NOTE — Progress Notes (Signed)
[facility]  PATIENT: Grace [name] DOB: 02-24-1961  REFERRING DOCTOR OR PCP:  Grace [name] SOURCE: patient  _________________________________   HISTORICAL  CHIEF COMPLAINT:  Chief Complaint  Patient presents with  . Multiple Sclerosis    She is not currently on any MS therapy.  Sts. she is fearful of side effects of ms meds.  Sts. numbness/tingling in hands is some worse.  Left worse than right.  Otherwise, everything is about the same/fim    HISTORY OF PRESENT ILLNESS:  Grace [name] is a 54 year old woman with multiple sclerosis.  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 She had optic neuritis in 1999. In 2002, she had some right-sided numbness. MRIs of the brain have always been normal or showed just a couple of nonspecific foci. A cervical spine MRI showed a focus consistent with demyelination at C5. Additionally, there are 2 smaller foci at T2 and T3-T4.  CSF was also abnormal c/w MS.  She was diagnosed by Dr. Jacolyn [name] at [facility].   She was initially placed on Betaseron and then on Copaxone. Unfortunately, she was unable to tolerate either of these and has not been on any DMT medication since 2003.   She retried Betaseron but stopped after diagnosis of avascular necrosis requiring hip replacement.    Of note, she had had only one course of IV steroids prior to that and has had some afterwards.   She had not wanted to go on Tysabri or Gilenya and opted to go on no disease modifying therapy since 2012. At her last visit on 01/17/2014, she decided to go on Aubagio but never started  She has many symptoms related to her MS including dysesthesias, mildly reduced gait, fatigue and mood disturbances. She also has insomnia and periodic limb movements of sleep.   Her dysesthesias have a quality of tingling with pain and located mostly in the front of the legs and feet and in the the forearms and hands. At times, the uncomfortable  sensation goes up to the upper chest. She is on gabapentin 300 mg by mouth 3 times a day with some benefit. Higher doses caused a rash on her back?   She did not feel better on the higher dose.   She felt diazepam also helps the dysesthesias.   Nortriptyline was not effective and Lamictal caused a rash. At her last visit in September 2015, Trileptal was ordered but she stopped when the higher gabapentin dose caused issues.  On her past exams, she has had reduced sensation in the left leg compared to the right.   She reports mild left leg weakness weakness but notes her gait is slightly clumsy. She stumbles some but does not fall.  She denies any difficulty with her vision.   She had only mild urinary urgency and diarrhea.  Probiotics have helped both.     She reports physical greater than cognitive fatigue. Ritalin 10 mg by mouth 3 times a day has been beneficial. She did not receive much benefit from amantadine or Provigil. She reports continued insomnia. Melatonin nightly helps her. On nights it does not help she takes zolpidem with benefit. Her periodic limb movements of sleep were improved with gabapentin. She was unable to tolerate ropinirole.  She notes some cognitive dysfunction.   Specifically, she reports decreased short term memory and coming up with the right words.    She focuses better with ritalin    She has had mild  depression and anxiety off and on. Proved after taking vitamin D supplements. Diazepam has helped the anxiety some.  About 80 pages of records from [facility] neurology were reviewed including MRI results and polysomnogram.   MRI of the cervical spine 10/09/2012 shows a focus within the cervical cord adjacent to the C5 vertebral body.  At this level she also has mild spinal stenosis due to a large posterior disc protrusion. There are milder protrusions at C4-C5 and C6-C7. The MRI was unchanged when compared to an MRI dated 08/30/2011. An MRI of the brain to 2014 showed only two  hyperintense foci in the white matter on the left and the right frontal lobes. The lesion on the right was not present on the previous MRI dated 08/30/2011. There  was no abnormal enhancement.  Vitamin D was low at 18.7 in August 2013. Other lab tests were essentially normal.    NMO Ab ws negative A polysomnogram dated 11/12/2007 showed no significant sleep apnea (AHI is 2.3) with moderate snoring and moderate periodic limb movements of sleep. The efficiency was reduced with fragmentation.     REVIEW OF SYSTEMS: Constitutional: No fevers, chills, sweats, or change in appetite.  Has fatigue Eyes: No visual changes, double vision, eye pain Ear, nose and throat: No hearing loss, ear pain, nasal congestion, sore throat Cardiovascular: No chest pain, palpitations Respiratory: No shortness of breath at rest or with exertion.   No wheezes GastrointestinaI: No nausea, vomiting, diarrhea, abdominal pain, fecal incontinence Genitourinary: No dysuria, urinary retention or frequency.  No nocturia. Musculoskeletal: She reports back pain and pain and some of her joints, especially her knees and hips. Integumentary: No rash, pruritus, skin lesions Neurological: as above.  She reports restless leg, insomnia and excessive daytime sleepiness. Psychiatric: No depression at this time.  No anxiety Endocrine: No palpitations, diaphoresis, change in appetite, change in weigh.  He notes heat intolerance and excessive thirst at times. Hematologic/Lymphatic: No anemia, purpura, petechiae. Allergic/Immunologic: No itchy/runny eyes, nasal congestion, recent allergic reactions, rashes  ALLERGIES: Allergies  Allergen Reactions  . Lamictal [Lamotrigine] Shortness Of Breath, Itching and Swelling    Tongue swells  . Ultram [Tramadol Hcl] Anaphylaxis  . Codeine Itching  . Latex     Latex band-aids cause redness and tears your skin    HOME MEDICATIONS:  Current outpatient prescriptions:  .  ALPRAZolam (XANAX)  0.5 MG tablet, , Disp: , Rfl:  .  amantadine (SYMMETREL) 100 MG capsule, Take 100 mg by mouth 2 (two) times daily., Disp: , Rfl:  .  Ascorbic Acid (VITAMIN C) 1000 MG tablet, Take 1,000 mg by mouth daily., Disp: , Rfl:  .  aspirin 81 MG tablet, Take 81 mg by mouth daily., Disp: , Rfl:  .  baclofen (LIORESAL) 10 MG tablet, Take 20 mg by mouth 3 (three) times daily., Disp: , Rfl:  .  carisoprodol (SOMA) 350 MG tablet, Take 350 mg by mouth daily., Disp: , Rfl:  .  diazepam (VALIUM) 5 MG tablet, Take 5 mg by mouth. Take 1-2 tablets daily, Disp: , Rfl:  .  diphenhydrAMINE (BENADRYL) 25 MG tablet, Take 25 mg by mouth 3 (three) times daily., Disp: , Rfl:  .  ergocalciferol (VITAMIN D2) 50000 UNITS capsule, Take 50,000 Units by mouth. Take twice a week, Disp: , Rfl:  .  fexofenadine-pseudoephedrine (ALLEGRA-D 24) 180-240 MG per 24 hr tablet, Take 1 tablet by mouth daily., Disp: , Rfl:  .  FLUoxetine (PROZAC) 20 MG capsule, Take 20 mg by mouth  daily., Disp: , Rfl:  .  fluticasone (FLONASE) 50 MCG/ACT nasal spray, Place 2 sprays into the nose every other day., Disp: , Rfl:  .  hydrochlorothiazide (MICROZIDE) 12.5 MG capsule, Take 12.5 mg by mouth daily., Disp: , Rfl:  .  HYDROcodone-acetaminophen (NORCO) 5-325 MG per tablet, Take 1 tablet by mouth daily., Disp: , Rfl:  .  Melatonin 5 MG TABS, Take 2 tablets by mouth at bedtime., Disp: , Rfl:  .  methylphenidate (RITALIN) 10 MG tablet, Take 10 mg by mouth 3 (three) times daily., Disp: , Rfl:  .  Multiple Vitamin (MULITIVITAMIN WITH MINERALS) TABS, Take 2 tablets by mouth daily., Disp: , Rfl:  .  nortriptyline (PAMELOR) 25 MG capsule, Take 50 mg by mouth at bedtime., Disp: , Rfl:  .  phentermine 37.5 MG capsule, Take 37.5 mg by mouth every morning., Disp: , Rfl:  .  rizatriptan (MAXALT-MLT) 10 MG disintegrating tablet, Take 10 mg by mouth as needed. May repeat in 2 hours if needed, Disp: , Rfl:  .  tizanidine (ZANAFLEX) 2 MG capsule, Take 2 mg by mouth 3  (three) times daily., Disp: , Rfl:  .  vitamin E 400 UNIT capsule, Take 400 Units by mouth daily., Disp: , Rfl:  .  zolpidem (AMBIEN) 10 MG tablet, Take 10 mg by mouth at bedtime as needed. For sleep, Disp: , Rfl:   PAST MEDICAL HISTORY: Past Medical History  Diagnosis Date  . OSA (obstructive sleep apnea)   . Multiple sclerosis     dx in 11/1996  . Hand fracture, left     9/87  . Raynaud's phenomenon   . Diverticulosis   . Numbness and tingling in hands   . Migraines   . Lhermitte's syndrome   . Avascular necrosis   . Neuropathy     PAST SURGICAL HISTORY: Past Surgical History  Procedure Laterality Date  . Cholecystectomy    . Colonoscopy      1997/2001  . Breast biopsy      core/left breast  . Laparoscopic abdominal exploration      ovaries and intestines bond together  . Lumbar puncture      11/01/1996  . Total hip arthroplasty      left hip  6/02  /right hip12/04  . Left heart catheterization with coronary angiogram N/A 06/21/2011    Procedure: LEFT HEART CATHETERIZATION WITH CORONARY ANGIOGRAM;  Surgeon: Leonie Man, MD;  Location: [facility] CATH LAB;  Service: Cardiovascular;  Laterality: N/A;    FAMILY HISTORY: Family History  Problem Relation Age of Onset  . Pancreatic cancer Mother   . Stroke Mother   . Testicular cancer Brother   . Colon cancer Maternal Uncle   . Pancreatic cancer Paternal Uncle   . Prostate cancer Maternal Grandfather   . Colon polyps Father     SOCIAL HISTORY:  History   Social History  . Marital Status: Married    Spouse Name: N/A  . Number of Children: N/A  . Years of Education: N/A   Occupational History  . Not on file.   Social History Main Topics  . Smoking status: Former Research scientist (life sciences)  . Smokeless tobacco: Never Used  . Alcohol Use: No  . Drug Use: No  . Sexual Activity: Not on file   Other Topics Concern  . Not on file   Social History Narrative     PHYSICAL EXAM  There were no vitals filed for this  visit.  There is no weight on file  to calculate BMI.   General: The patient is well-developed and well-nourished and in no acute distress  Eyes:  Funduscopic exam shows some left optic nerve pallor.  Neck: The neck is supple, no carotid bruits are noted.  The neck is nontender.  Cardiovascular: The heart has a regular rate and rhythm with a normal S1 and S2. There were no murmurs, gallops or rubs. Lungs are clear to auscultation.  Skin: Extremities are without significant edema.  Musculoskeletal:  Back is nontender  Neurologic Exam  Mental status: The patient is alert and oriented x 3 at the time of the examination. The patient has apparent normal recent and remote memory, with an apparently normal attention span and concentration ability.   Speech is normal.  Cranial nerves: Extraocular movements are full. Pupils show 2+ left APD.  Visual fields are full. Colors and acuity are reduced OS compared to OD. Facial symmetry is present. There is reduced left facial sensation to soft touch .Facial strength is normal.  Trapezius and sternocleidomastoid strength is normal. No dysarthria is noted.  The tongue is midline, and the patient has symmetric elevation of the soft palate. No obvious hearing deficits are noted.  Motor:  Muscle bulk is normal.   Tone is mildly increased on legs. Strength is  5 / 5 in all 4 extremities.   Sensory: Sensory testing shows reduced right piprick, soft touch and vibration sensation.  Coordination: Cerebellar testing reveals good finger-nose-finger and heel-to-shin bilaterally.  Gait and station: Station is normal.   Gait is wide with unstable turn. Tandem gait is very wide. Romberg is negative.   Reflexes: Deep tendon reflexes are symmetric and normal bilaterally.   Plantar responses are flexor.    DIAGNOSTIC DATA (LABS, IMAGING, TESTING) - I reviewed patient records, labs, notes, testing and imaging myself where available.    ASSESSMENT AND  PLAN  Multiple sclerosis - Plan: MR Brain W Wo Contrast, MR Cervical Spine W Wo Contrast, MR Thoracic Spine W Wo Contrast  Transverse myelitis - Plan: MR Brain W Wo Contrast, MR Cervical Spine W Wo Contrast, MR Thoracic Spine W Wo Contrast  Ataxic gait  Dysesthesia  Other fatigue  Insomnia  Depression with anxiety  Low serum vitamin D  Optic neuritis    In summary, Marquerite Forsman is a 54 year old woman with multiple sclerosis who continues to have dysesthesias that are likely due to her prior transverse myelitis events. She chose to stop all disease modifying therapies due to side effects from her earlier injections.  We will need to check an MRI of the brain and thoracic and cervical spine to make sure that she has not any further subclinical progression. If this occurs, I would consider starting her back on therapy would strongly urge her to do so. In the past, her MRIs have shown relative stability but her last one was about 3 years ago.  Medications were refilled her she was advised to continue her vitamin D and to try to be more active.    She will return to see me and 6 months or sooner if she has new or worsening neurologic symptoms.  45 minute face-to-face evaluation with greater than 50% of the time counseling and coordinating care about her multiple sclerosis and symptoms. We also further discussed disease modifying therapies.  Grace [name] A. Felecia Shelling, MD, PhD 0/94/7096, 2:83 AM Certified in Neurology, Clinical Neurophysiology, Sleep Medicine, Pain Medicine and Neuroimaging  [facility] [address] [place], [place] [address] [phone]

## 2014-07-16 ENCOUNTER — Ambulatory Visit
Admission: RE | Admit: 2014-07-16 | Discharge: 2014-07-16 | Disposition: A | Discharge status: Home / Self Care | Payer: Commercial Managed Care - HMO | Source: Ambulatory Visit | Attending: Neurology | Admitting: Neurology

## 2014-07-16 DIAGNOSIS — G373 Acute transverse myelitis in demyelinating disease of central nervous system: Secondary | ICD-10-CM

## 2014-07-16 DIAGNOSIS — G0489 Other myelitis: Secondary | ICD-10-CM

## 2014-07-16 DIAGNOSIS — G35 Multiple sclerosis: Secondary | ICD-10-CM

## 2014-07-16 MED ORDER — GADOBENATE DIMEGLUMINE 529 MG/ML IV SOLN
19.0000 mL | Freq: Once | INTRAVENOUS | Status: AC | PRN
  Administered 2014-07-16: 19 mL via INTRAVENOUS

## 2014-07-17 ENCOUNTER — Telehealth: Payer: Self-pay | Admitting: *Deleted

## 2014-07-17 NOTE — Telephone Encounter (Signed)
-----   Message from Britt Bottom, MD sent at 07/17/2014  1:16 PM EST ----- Please let her know no new MS changes in brain or spine.   OK to stay off med's.      She does have some degenerative changes in mid-cervical spine but they are unchanged compared to 2013

## 2014-07-17 NOTE — Telephone Encounter (Signed)
Spoke with Grace [name] and per RAS, advised no new MS changes in brain or spine, some unchanged wear and tear changes in neck.  Grace [name] verbalized understanding of same.  Per her request, mri report mailed to her home address/fim

## 2014-08-05 ENCOUNTER — Other Ambulatory Visit: Payer: Self-pay | Admitting: Neurology

## 2014-08-05 MED ORDER — HYDROCODONE-ACETAMINOPHEN 5-325 MG PO TABS: 1.0000 | ORAL_TABLET | Freq: Three times a day (TID) | ORAL | Status: DC | PRN

## 2014-08-05 NOTE — Telephone Encounter (Signed)
Patient is calling to get written Rx Hydrocodone 5-325 mg.  Please call.

## 2014-08-21 ENCOUNTER — Other Ambulatory Visit: Payer: Self-pay | Admitting: Neurology

## 2014-09-16 ENCOUNTER — Telehealth: Payer: Self-pay | Admitting: Neurology

## 2014-09-16 MED ORDER — HYDROCODONE-ACETAMINOPHEN 5-325 MG PO TABS: 1.0000 | ORAL_TABLET | Freq: Three times a day (TID) | ORAL | Status: DC | PRN

## 2014-09-16 NOTE — Telephone Encounter (Signed)
Rx. last filled in March.  Rx. printed, signed, up front [facility]/fim

## 2014-09-16 NOTE — Telephone Encounter (Signed)
Patient called requesting a refill for HYDROcodone-acetaminophen (NORCO/VICODIN) 5-325 MG per tablet. Patient was advised that the script will be ready for pick up in 24hrs unless advised otherwise by the nurse. Patient can be reached @ [phone]

## 2014-09-23 ENCOUNTER — Telehealth: Payer: Self-pay | Admitting: Neurology

## 2014-09-23 MED ORDER — RIZATRIPTAN BENZOATE 10 MG PO TBDP: 10.0000 mg | ORAL_TABLET | ORAL | Status: DC | PRN

## 2014-09-23 NOTE — Telephone Encounter (Signed)
I have spoken with Emiley this am and advised Maxalt was escribed to [facility]/fim

## 2014-09-23 NOTE — Telephone Encounter (Signed)
Patient called and requested refill on Rx. rizatriptan (MAXALT-MLT) 10 MG disintegrating tablet. Please call and advise.

## 2014-10-18 ENCOUNTER — Other Ambulatory Visit: Payer: Self-pay | Admitting: Neurology

## 2014-10-18 NOTE — Telephone Encounter (Signed)
Patient requested a refill on Rx. HYDROcodone-acetaminophen (NORCO/VICODIN) 5-325 MG per tablet.

## 2014-10-18 NOTE — Telephone Encounter (Signed)
Request entered, forwarded to provider for approval.  

## 2014-10-20 MED ORDER — HYDROCODONE-ACETAMINOPHEN 5-325 MG PO TABS: 1.0000 | ORAL_TABLET | Freq: Three times a day (TID) | ORAL | Status: DC | PRN

## 2014-10-21 NOTE — Progress Notes (Signed)
Hydrocodone-acetaminophen 5-325 mg is available at front desk

## 2014-11-01 ENCOUNTER — Encounter: Payer: Self-pay | Admitting: Neurology

## 2014-11-01 ENCOUNTER — Ambulatory Visit (INDEPENDENT_AMBULATORY_CARE_PROVIDER_SITE_OTHER): Payer: Commercial Managed Care - HMO | Admitting: Neurology

## 2014-11-01 VITALS — BP 124/78 | HR 74 | Resp 14 | Ht 65.0 in | Wt 192.0 lb

## 2014-11-01 DIAGNOSIS — R208 Other disturbances of skin sensation: Secondary | ICD-10-CM

## 2014-11-01 DIAGNOSIS — F418 Other specified anxiety disorders: Secondary | ICD-10-CM

## 2014-11-01 DIAGNOSIS — R5383 Other fatigue: Secondary | ICD-10-CM | POA: Diagnosis not present

## 2014-11-01 DIAGNOSIS — G4700 Insomnia, unspecified: Secondary | ICD-10-CM

## 2014-11-01 DIAGNOSIS — R260 Ataxic gait: Secondary | ICD-10-CM

## 2014-11-01 DIAGNOSIS — G35 Multiple sclerosis: Secondary | ICD-10-CM

## 2014-11-01 MED ORDER — TRAZODONE HCL 100 MG PO TABS: 100.0000 mg | ORAL_TABLET | Freq: Every day | ORAL | Status: DC

## 2014-11-01 MED ORDER — ALPRAZOLAM 0.5 MG PO TABS: 0.5000 mg | ORAL_TABLET | Freq: Every day | ORAL | Status: DC | PRN

## 2014-11-01 MED ORDER — TIZANIDINE HCL 4 MG PO TABS: 4.0000 mg | ORAL_TABLET | Freq: Three times a day (TID) | ORAL | Status: DC

## 2014-11-01 MED ORDER — METHYLPHENIDATE HCL 10 MG PO TABS: 10.0000 mg | ORAL_TABLET | Freq: Three times a day (TID) | ORAL | Status: DC

## 2014-11-01 NOTE — Addendum Note (Signed)
Addended by: Arlice Colt A on: 11/01/2014 04:44 PM   Modules accepted: Level of Service

## 2014-11-01 NOTE — Progress Notes (Signed)
[facility]  PATIENT: Grace [name] DOB: [birth year]  REFERRING DOCTOR OR PCP:  [name] SOURCE: patient  _________________________________   HISTORICAL  CHIEF COMPLAINT:  Chief Complaint  Patient presents with  . Multiple Sclerosis    She is not currently on any MS therapy (couldn't tolerate side effects of meds).  MRI brain/c-spine done earlier this year showed no new ms changes.      HISTORY OF PRESENT ILLNESS:  Grace [name] is a 54 year old woman with multiple sclerosis.     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 She had optic neuritis in 1999. In 2002, she had right-sided numbness. MRIs of the brain have always been normal or showed just a couple of nonspecific foci. A cervical spine MRI showed a focus consistent with demyelination at C5. Additionally, there are 2 smaller foci at T2 and T3-T4.  CSF was also abnormal c/w MS.     She was initially placed on Betaseron and then on Copaxone. She was unable to tolerate either of these and has not been on any DMT medication since 2003.   She retried Betaseron but stopped after diagnosis of avascular necrosis requiring hip replacement.    Of note, she had had only one course of IV steroids prior to that and has had some afterwards.   She had not wanted to go on Tysabri or Gilenya and opted to go on no disease modifying therapy since 2012. At her last visit on 01/17/2014, she decided to go on Aubagio but never started.     Most recent MRIs are from earlier this year and showed no changes.  Gait/strength/sensation:    She has burning dysesthetic pain located mostly in the front of the legs and feet and in the the hands.   She is on gabapentin 300 mg by mouth 3 times a day with some benefit. When she was on higher doses, she had a rash on her back and prefers not to retry higher doses.   Nortriptyline was not effective and Lamictal caused a rash. She is not sure  if there has been any benefit with Trileptal.  She reports mild left leg weakness weakness but notes her gait is slightly clumsy. She stumbles some but does not fall.  Vision:  She denies any difficulty with her vision.     Bladder: She had only mild urinary urgency .     Fatigue/sleep:   She reports physical greater than cognitive fatigue. Ritalin 10 mg by mouth 3 times a day has helped the symptoms and is well tolerated.   She did not receive benefit from amantadine or Provigil. Sleep is poor due to onset and maintenance insomnia.   Melatonin nightly helps he and on nights it does not help she takes zolpidem with benefit. Her periodic limb movements of sleep were improved with gabapentin.    Mood/cognition:   She has mild depression that has been worse since her husband had a stroke. She also notes anxiety helped by Xanax. She notes  mild cognitive dysfunction, mostly reduced short term memory and verbal fluency.   Ritalin has helped her cognition slightly.      REVIEW OF SYSTEMS: Constitutional: No fevers, chills, sweats, or change in appetite.  Has fatigue Eyes: No visual changes, double vision, eye pain Ear, nose and throat: No hearing loss, ear pain, nasal congestion, sore throat Cardiovascular: No chest pain, palpitations Respiratory: No shortness of  breath at rest or with exertion.   No wheezes GastrointestinaI: No nausea, vomiting, diarrhea, abdominal pain, fecal incontinence Genitourinary: No dysuria, urinary retention or frequency.  No nocturia. Musculoskeletal: She reports back pain and pain and some of her joints, especially her knees and hips. Integumentary: No rash, pruritus, skin lesions Neurological: as above.  She reports restless leg, insomnia and excessive daytime sleepiness. Psychiatric: No depression at this time.  No anxiety Endocrine: No palpitations, diaphoresis, change in appetite, change in weigh.  He notes heat intolerance and excessive thirst at  times. Hematologic/Lymphatic: No anemia, purpura, petechiae. Allergic/Immunologic: No itchy/runny eyes, nasal congestion, recent allergic reactions, rashes  ALLERGIES: Allergies  Allergen Reactions  . Lamictal [Lamotrigine] Shortness Of Breath, Itching and Swelling    Tongue swells  . Ultram [Tramadol Hcl] Anaphylaxis  . Codeine Itching  . Latex     Latex band-aids cause redness and tears your skin    HOME MEDICATIONS:  Current outpatient prescriptions:  .  ALPRAZolam (XANAX) 0.5 MG tablet, , Disp: , Rfl:  .  amantadine (SYMMETREL) 100 MG capsule, Take 100 mg by mouth 2 (two) times daily., Disp: , Rfl:  .  Ascorbic Acid (VITAMIN C) 1000 MG tablet, Take 1,000 mg by mouth daily., Disp: , Rfl:  .  aspirin 81 MG tablet, Take 81 mg by mouth daily., Disp: , Rfl:  .  baclofen (LIORESAL) 10 MG tablet, Take 20 mg by mouth 3 (three) times daily., Disp: , Rfl:  .  carisoprodol (SOMA) 350 MG tablet, Take 350 mg by mouth daily., Disp: , Rfl:  .  diphenhydrAMINE (BENADRYL) 25 MG tablet, Take 25 mg by mouth 3 (three) times daily., Disp: , Rfl:  .  ergocalciferol (VITAMIN D2) 50000 UNITS capsule, Take 50,000 Units by mouth. Take twice a week, Disp: , Rfl:  .  fexofenadine-pseudoephedrine (ALLEGRA-D 24) 180-240 MG per 24 hr tablet, Take 1 tablet by mouth daily., Disp: , Rfl:  .  fluticasone (FLONASE) 50 MCG/ACT nasal spray, Place 2 sprays into the nose every other day., Disp: , Rfl:  .  gabapentin (NEURONTIN) 400 MG capsule, TAKE 1 CAPSULE BY MOUTH THREE TIMES A DAY AND 2 CAPSULES AT NIGHT., Disp: 150 capsule, Rfl: 11 .  HYDROcodone-acetaminophen (NORCO/VICODIN) 5-325 MG per tablet, Take 1 tablet by mouth 3 (three) times daily as needed for moderate pain., Disp: 90 tablet, Rfl: 0 .  Melatonin 5 MG TABS, Take 2 tablets by mouth at bedtime., Disp: , Rfl:  .  methylphenidate (RITALIN) 10 MG tablet, Take 10 mg by mouth 3 (three) times daily., Disp: , Rfl:  .  Multiple Vitamin (MULITIVITAMIN WITH  MINERALS) TABS, Take 2 tablets by mouth daily., Disp: , Rfl:  .  rizatriptan (MAXALT-MLT) 10 MG disintegrating tablet, Take 1 tablet (10 mg total) by mouth as needed. May repeat in 2 hours if needed, Disp: 10 tablet, Rfl: 3 .  tizanidine (ZANAFLEX) 2 MG capsule, Take 2 mg by mouth 3 (three) times daily., Disp: , Rfl:  .  vitamin E 400 UNIT capsule, Take 400 Units by mouth daily., Disp: , Rfl:  .  zolpidem (AMBIEN) 10 MG tablet, Take 10 mg by mouth at bedtime as needed. For sleep, Disp: , Rfl:  .  diazepam (VALIUM) 5 MG tablet, Take 1 tablet (5 mg total) by mouth every 12 (twelve) hours as needed for anxiety. Take 1-2 tablets daily (Patient not taking: Reported on 11/01/2014), Disp: 60 tablet, Rfl: 0 .  FLUoxetine (PROZAC) 20 MG capsule, Take 20 mg by  mouth daily., Disp: , Rfl:  .  hydrochlorothiazide (MICROZIDE) 12.5 MG capsule, Take 12.5 mg by mouth daily., Disp: , Rfl:  .  Oxcarbazepine (TRILEPTAL) 300 MG tablet, , Disp: , Rfl:  .  phentermine 37.5 MG capsule, Take 37.5 mg by mouth every morning., Disp: , Rfl:   PAST MEDICAL HISTORY: Past Medical History  Diagnosis Date  . OSA (obstructive sleep apnea)   . Multiple sclerosis     dx in 11/1996  . Hand fracture, left     9/87  . Raynaud's phenomenon   . Diverticulosis   . Numbness and tingling in hands   . Migraines   . Lhermitte's syndrome   . Avascular necrosis   . Neuropathy     PAST SURGICAL HISTORY: Past Surgical History  Procedure Laterality Date  . Cholecystectomy    . Colonoscopy      1997/2001  . Breast biopsy      core/left breast  . Laparoscopic abdominal exploration      ovaries and intestines bond together  . Lumbar puncture      11/01/1996  . Total hip arthroplasty      left hip  6/02  /right hip12/04  . Left heart catheterization with coronary angiogram N/A 06/21/2011    Procedure: LEFT HEART CATHETERIZATION WITH CORONARY ANGIOGRAM;  Surgeon: Leonie Man, MD;  Location: [facility] CATH LAB;  Service:  Cardiovascular;  Laterality: N/A;    FAMILY HISTORY: Family History  Problem Relation Age of Onset  . Pancreatic cancer Mother   . Stroke Mother   . Testicular cancer Brother   . Colon cancer Maternal Uncle   . Pancreatic cancer Paternal Uncle   . Prostate cancer Maternal Grandfather   . Colon polyps Father     SOCIAL HISTORY:  History   Social History  . Marital Status: Married    Spouse Name: N/A  . Number of Children: N/A  . Years of Education: N/A   Occupational History  . Not on file.   Social History Main Topics  . Smoking status: Former Research scientist (life sciences)  . Smokeless tobacco: Never Used  . Alcohol Use: No  . Drug Use: No  . Sexual Activity: Not on file   Other Topics Concern  . Not on file   Social History Narrative     PHYSICAL EXAM  Filed Vitals:   11/01/14 0917  BP: 124/78  Pulse: 74  Resp: 14  Height: 5\' 5"  (1.651 m)  Weight: 192 lb (87.091 kg)    Body mass index is 31.95 kg/(m^2).   General: The patient is well-developed and well-nourished and in no acute distress  Neurologic Exam  Mental status: The patient is alert and oriented x 3 at the time of the examination. The patient has apparent normal recent and remote memory, with an apparently normal attention span and concentration ability.   Speech is normal.  Cranial nerves: Extraocular movements are full. Pupils show 2+ left APD.  Visual fields are full. Colors and acuity are reduced OS compared to OD. Facial symmetry is present. There is reduced left facial sensation to soft touch .Facial strength is normal.  Trapezius and sternocleidomastoid strength is normal. No dysarthria is noted.  The tongue is midline, and the patient has symmetric elevation of the soft palate. No obvious hearing deficits are noted.  Motor:  Muscle bulk is normal.   Tone is mildly increased on legs. Strength is  5 / 5 in all 4 extremities.   Sensory: Sensory testing shows reduced  right  touch and vibration  sensation.  Coordination: Cerebellar testing reveals good finger-nose-finger and heel-to-shin bilaterally.  Gait and station: Station is normal.   Gait is wide Tandem gait is very wide. Romberg is negative.   Reflexes: Deep tendon reflexes are symmetric and normal bilaterally.        DIAGNOSTIC DATA (LABS, IMAGING, TESTING) - I reviewed patient records, labs, notes, testing and imaging myself where available.    ASSESSMENT AND PLAN  Multiple sclerosis  Ataxic gait  Other fatigue  Dysesthesia  Depression with anxiety  Insomnia   1.  Increase tizanidine to up to 4 mg 3 times a day. Hopefully this will help her sleep as well. 2.  Continue other medications. Ritalin was renewed. Xanax was renewed. 3.  She needs to stay active and exercise as tolerated.  She will return to see me and 4 months or sooner if she has new or worsening neurologic symptoms.  Wilbur Labuda A. Felecia Shelling, MD, PhD 4/65/0354, 6:56 AM Certified in Neurology, Clinical Neurophysiology, Sleep Medicine, Pain Medicine and Neuroimaging  [facility] [address], [place], [place] [address] [phone]

## 2014-12-18 ENCOUNTER — Other Ambulatory Visit: Payer: Self-pay | Admitting: Neurology

## 2014-12-18 ENCOUNTER — Encounter: Payer: Self-pay | Admitting: *Deleted

## 2014-12-18 MED ORDER — HYDROCODONE-ACETAMINOPHEN 5-325 MG PO TABS: 1.0000 | ORAL_TABLET | Freq: Three times a day (TID) | ORAL | Status: DC | PRN

## 2014-12-18 NOTE — Telephone Encounter (Signed)
Request entered, forwarded to provider for approval.  

## 2014-12-18 NOTE — Progress Notes (Signed)
Hydrocodone rx. up front [facility]/fim 

## 2014-12-18 NOTE — Telephone Encounter (Signed)
Patient request refill HYDROcodone-acetaminophen (NORCO/VICODIN) 5-325 MG per tablet

## 2015-01-27 ENCOUNTER — Telehealth: Payer: Self-pay | Admitting: Neurology

## 2015-01-27 MED ORDER — HYDROCODONE-ACETAMINOPHEN 5-325 MG PO TABS: 1.0000 | ORAL_TABLET | Freq: Three times a day (TID) | ORAL | Status: DC | PRN

## 2015-01-27 NOTE — Telephone Encounter (Signed)
Rx. up front [facility]/fim 

## 2015-01-27 NOTE — Telephone Encounter (Signed)
Patient called requesting refill for HYDROcodone-acetaminophen (NORCO/VICODIN) 5-325 MG per tablet. She states she will pay remainder of balance when she picks up RX. Please put balance on outside of envelope to help her remember to pay. Patient advised RX will be ready within 24 hours unless otherwise informed by RN.

## 2015-01-27 NOTE — Telephone Encounter (Signed)
Hydrocodone rx. printed, awaiting RAS sig/fim

## 2015-02-18 ENCOUNTER — Other Ambulatory Visit: Payer: Self-pay

## 2015-02-18 DIAGNOSIS — Z1231 Encounter for screening mammogram for malignant neoplasm of breast: Secondary | ICD-10-CM

## 2015-02-26 ENCOUNTER — Ambulatory Visit
Admission: RE | Admit: 2015-02-26 | Discharge: 2015-02-26 | Disposition: A | Discharge status: Home / Self Care | Payer: Medicare Other | Source: Ambulatory Visit

## 2015-02-26 DIAGNOSIS — Z1231 Encounter for screening mammogram for malignant neoplasm of breast: Secondary | ICD-10-CM

## 2015-03-03 ENCOUNTER — Telehealth: Payer: Self-pay | Admitting: Neurology

## 2015-03-03 ENCOUNTER — Ambulatory Visit (INDEPENDENT_AMBULATORY_CARE_PROVIDER_SITE_OTHER): Payer: Medicare Other | Admitting: Neurology

## 2015-03-03 ENCOUNTER — Encounter: Payer: Self-pay | Admitting: Neurology

## 2015-03-03 VITALS — BP 132/80 | HR 78 | Resp 16 | Ht 65.0 in | Wt 197.8 lb

## 2015-03-03 DIAGNOSIS — M25559 Pain in unspecified hip: Secondary | ICD-10-CM | POA: Insufficient documentation

## 2015-03-03 DIAGNOSIS — G35 Multiple sclerosis: Secondary | ICD-10-CM | POA: Diagnosis not present

## 2015-03-03 DIAGNOSIS — R260 Ataxic gait: Secondary | ICD-10-CM | POA: Diagnosis not present

## 2015-03-03 DIAGNOSIS — F418 Other specified anxiety disorders: Secondary | ICD-10-CM

## 2015-03-03 DIAGNOSIS — G4700 Insomnia, unspecified: Secondary | ICD-10-CM | POA: Diagnosis not present

## 2015-03-03 DIAGNOSIS — G373 Acute transverse myelitis in demyelinating disease of central nervous system: Secondary | ICD-10-CM

## 2015-03-03 DIAGNOSIS — R5383 Other fatigue: Secondary | ICD-10-CM

## 2015-03-03 DIAGNOSIS — R208 Other disturbances of skin sensation: Secondary | ICD-10-CM

## 2015-03-03 DIAGNOSIS — M25552 Pain in left hip: Secondary | ICD-10-CM | POA: Diagnosis not present

## 2015-03-03 MED ORDER — HYDROCODONE-ACETAMINOPHEN 5-325 MG PO TABS: 1.0000 | ORAL_TABLET | Freq: Three times a day (TID) | ORAL | Status: DC | PRN

## 2015-03-03 MED ORDER — LORATADINE-PSEUDOEPHEDRINE ER 10-240 MG PO TB24: 1.0000 | ORAL_TABLET | Freq: Every day | ORAL | Status: DC

## 2015-03-03 MED ORDER — DOXEPIN HCL 10 MG/ML PO CONC: 10.0000 mg | Freq: Every day | ORAL | Status: DC

## 2015-03-03 MED ORDER — CARISOPRODOL 350 MG PO TABS: 350.0000 mg | ORAL_TABLET | Freq: Every day | ORAL | Status: DC

## 2015-03-03 MED ORDER — DOXEPIN HCL 10 MG PO CAPS: 10.0000 mg | ORAL_CAPSULE | Freq: Every day | ORAL | Status: DC

## 2015-03-03 MED ORDER — METHYLPHENIDATE HCL 10 MG PO TABS: 10.0000 mg | ORAL_TABLET | Freq: Three times a day (TID) | ORAL | Status: DC

## 2015-03-03 NOTE — Telephone Encounter (Signed)
Patient called, was just seen today by Dr. Felecia Shelling. carisoprodol (SOMA) 350 MG tablet was written for Quantity of 90 to take 1 per day, patient is asking if it should be 3 per day. doxepin (SINEQUAN) 10 MG/ML solution was written for liquid form instead of pill form, patient requests pill form. Please call [phone].

## 2015-03-03 NOTE — Progress Notes (Signed)
[facility]  PATIENT: Grace [name] DOB: 1960-07-30  REFERRING DOCTOR OR PCP:  [name] SOURCE: patient  _________________________________   HISTORICAL  CHIEF COMPLAINT:  Chief Complaint  Patient presents with  . Multiple Sclerosis    She is not on any MS med.  Today she c/o left hip pain onset 4 days ago--no known injury.  Sts. now that she is on Medicare, she would like to try again to get [facility] approved.  Sts. it helps more than Tizanidine.  She would also like a rx. for Allegra D for allergies, and needs Hydrocodone r/f/fim    HISTORY OF PRESENT ILLNESS:  Grace [name] is a 54 year old woman with multiple sclerosis.     Gait/strength/sensation:    She has burning dysesthetic pain in the front of the legs and feet and in the the hands.   She is on gabapentin 400 mg by mouth 3 times a day with mild benefit only. When she was on higher doses, she had a rash on her back and prefers not to retry higher doses.   Nortriptyline and trileptal were not effective and Lamictal caused a rash.  She reports mild left leg weakness (old) but notes her gait is slightly clumsy. She stumbles some but does not fall.    Vision:  She denies any difficulty with her vision.     Bladder: She had only mild urinary urgency .     Fatigue/sleep:   She reports physical and cognitive fatigue, helped by Ritalin 10 mg by mouth 3 times a day .   She did not receive benefit from amantadine or Provigil. Sleep is poor due to onset and maintenance insomnia.   Melatonin nightly helps he and on nights it does not help she takes zolpidem with benefit. Her periodic limb movements of sleep were improved with gabapentin.   Trazodone helps her sleep for a few hours but then she feels foggy in the morning.    She has never tried Doxepin  Mood/cognition:   She has mild depression and some anxiety, helped by prozac and Xanax. She notes  mild cognitive dysfunction, mostly reduced short term memory and  verbal fluency.   Ritalin has helped her cognition slightly.     Hip pain:   She is noting pain in the left buttock and hip.    Pain increases with laying on left side.   No weakness or change in sensation.  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 She had optic neuritis in 1999. In 2002, she had right-sided numbness. MRIs of the brain have always been normal or showed just a couple of nonspecific foci. A cervical spine MRI showed a focus consistent with demyelination at C5. Additionally, there are 2 smaller foci at T2 and T3-T4.  CSF was also abnormal c/w MS.     She was initially placed on Betaseron and then on Copaxone. She was unable to tolerate either of these and has not been on any DMT medication since 2003.   She retried Betaseron but stopped after diagnosis of avascular necrosis requiring hip replacement.    Of note, she had had only one course of IV steroids prior to that and has had some afterwards.   She had not wanted to go on Tysabri or Gilenya and opted to go on no disease modifying therapy since 2012. At her last visit on 01/17/2014, she decided to go on Aubagio  but never started.     Most recent MRIs are from earlier this year and showed no changes.   REVIEW OF SYSTEMS: Constitutional: No fevers, chills, sweats, or change in appetite.  Has fatigue Eyes: No visual changes, double vision, eye pain Ear, nose and throat: No hearing loss, ear pain, nasal congestion, sore throat Cardiovascular: No chest pain, palpitations Respiratory: No shortness of breath at rest or with exertion.   No wheezes GastrointestinaI: No nausea, vomiting, diarrhea, abdominal pain, fecal incontinence Genitourinary: No dysuria, urinary retention or frequency.  No nocturia. Musculoskeletal: She reports back pain and pain and some of her joints, especially her knees and hips. Integumentary: No rash, pruritus, skin lesions Neurological: as above.  She  reports restless leg, insomnia and excessive daytime sleepiness. Psychiatric: No depression at this time.  No anxiety Endocrine: No palpitations, diaphoresis, change in appetite, change in weigh.  He notes heat intolerance and excessive thirst at times. Hematologic/Lymphatic: No anemia, purpura, petechiae. Allergic/Immunologic: No itchy/runny eyes, nasal congestion, recent allergic reactions, rashes  ALLERGIES: Allergies  Allergen Reactions  . Lamictal [Lamotrigine] Shortness Of Breath, Itching and Swelling    Tongue swells  . Ultram [Tramadol Hcl] Anaphylaxis  . Codeine Itching  . Latex     Latex band-aids cause redness and tears your skin    HOME MEDICATIONS:  Current outpatient prescriptions:  .  ALPRAZolam (XANAX) 0.5 MG tablet, Take 1 tablet (0.5 mg total) by mouth daily as needed for anxiety., Disp: 30 tablet, Rfl: 3 .  Ascorbic Acid (VITAMIN C) 1000 MG tablet, Take 1,000 mg by mouth daily., Disp: , Rfl:  .  aspirin 81 MG tablet, Take 81 mg by mouth daily., Disp: , Rfl:  .  baclofen (LIORESAL) 10 MG tablet, Take 20 mg by mouth 3 (three) times daily., Disp: , Rfl:  .  diphenhydrAMINE (BENADRYL) 25 MG tablet, Take 25 mg by mouth 3 (three) times daily., Disp: , Rfl:  .  ergocalciferol (VITAMIN D2) 50000 UNITS capsule, Take 50,000 Units by mouth. Take twice a week, Disp: , Rfl:  .  fluticasone (FLONASE) 50 MCG/ACT nasal spray, Place 2 sprays into the nose every other day., Disp: , Rfl:  .  gabapentin (NEURONTIN) 400 MG capsule, TAKE 1 CAPSULE BY MOUTH THREE TIMES A DAY AND 2 CAPSULES AT NIGHT., Disp: 150 capsule, Rfl: 11 .  HYDROcodone-acetaminophen (NORCO/VICODIN) 5-325 MG per tablet, Take 1 tablet by mouth 3 (three) times daily as needed for moderate pain., Disp: 90 tablet, Rfl: 0 .  Melatonin 5 MG TABS, Take 2 tablets by mouth at bedtime., Disp: , Rfl:  .  methylphenidate (RITALIN) 10 MG tablet, Take 1 tablet (10 mg total) by mouth 3 (three) times daily., Disp: 90 tablet, Rfl:  0 .  rizatriptan (MAXALT-MLT) 10 MG disintegrating tablet, Take 1 tablet (10 mg total) by mouth as needed. May repeat in 2 hours if needed, Disp: 10 tablet, Rfl: 3 .  tiZANidine (ZANAFLEX) 4 MG tablet, Take 1 tablet (4 mg total) by mouth 3 (three) times daily., Disp: 90 tablet, Rfl: 11 .  traZODone (DESYREL) 100 MG tablet, Take 1 tablet (100 mg total) by mouth at bedtime., Disp: 30 tablet, Rfl: 11 .  vitamin E 400 UNIT capsule, Take 400 Units by mouth daily., Disp: , Rfl:  .  zolpidem (AMBIEN) 10 MG tablet, Take 10 mg by mouth at bedtime as needed. For sleep, Disp: , Rfl:  .  amantadine (SYMMETREL) 100 MG capsule, Take 100 mg by mouth 2 (two) times daily.,  Disp: , Rfl:  .  carisoprodol (SOMA) 350 MG tablet, Take 350 mg by mouth daily., Disp: , Rfl:  .  fexofenadine-pseudoephedrine (ALLEGRA-D 24) 180-240 MG per 24 hr tablet, Take 1 tablet by mouth daily., Disp: , Rfl:  .  FLUoxetine (PROZAC) 20 MG capsule, Take 20 mg by mouth daily., Disp: , Rfl:  .  hydrochlorothiazide (MICROZIDE) 12.5 MG capsule, Take 12.5 mg by mouth daily., Disp: , Rfl:  .  Multiple Vitamin (MULITIVITAMIN WITH MINERALS) TABS, Take 2 tablets by mouth daily., Disp: , Rfl:  .  Oxcarbazepine (TRILEPTAL) 300 MG tablet, , Disp: , Rfl:  .  phentermine 37.5 MG capsule, Take 37.5 mg by mouth every morning., Disp: , Rfl:   PAST MEDICAL HISTORY: Past Medical History  Diagnosis Date  . OSA (obstructive sleep apnea)   . Multiple sclerosis ([place])     dx in 11/1996  . Hand fracture, left     9/87  . Raynaud's phenomenon   . Diverticulosis   . Numbness and tingling in hands   . Migraines   . Lhermitte's syndrome   . Avascular necrosis ([place])   . Neuropathy ([place])     PAST SURGICAL HISTORY: Past Surgical History  Procedure Laterality Date  . Cholecystectomy    . Colonoscopy      1997/2001  . Breast biopsy      core/left breast  . Laparoscopic abdominal exploration      ovaries and intestines bond together  . Lumbar puncture       11/01/1996  . Total hip arthroplasty      left hip  6/02  /right hip12/04  . Left heart catheterization with coronary angiogram N/A 06/21/2011    Procedure: LEFT HEART CATHETERIZATION WITH CORONARY ANGIOGRAM;  Surgeon: Leonie Man, MD;  Location: [facility] CATH LAB;  Service: Cardiovascular;  Laterality: N/A;    FAMILY HISTORY: Family History  Problem Relation Age of Onset  . Pancreatic cancer Mother   . Stroke Mother   . Testicular cancer Brother   . Colon cancer Maternal Uncle   . Pancreatic cancer Paternal Uncle   . Prostate cancer Maternal Grandfather   . Colon polyps Father     SOCIAL HISTORY:  Social History   Social History  . Marital Status: Married    Spouse Name: N/A  . Number of Children: N/A  . Years of Education: N/A   Occupational History  . Not on file.   Social History Main Topics  . Smoking status: Former Research scientist (life sciences)  . Smokeless tobacco: Never Used  . Alcohol Use: No  . Drug Use: No  . Sexual Activity: Not on file   Other Topics Concern  . Not on file   Social History Narrative     PHYSICAL EXAM  Filed Vitals:   03/03/15 1324  BP: 132/80  Pulse: 78  Resp: 16  Height: 5\' 5"  (1.651 m)  Weight: 197 lb 12.8 oz (89.721 kg)    Body mass index is 32.92 kg/(m^2).   General: The patient is well-developed and well-nourished and in no acute distress  Neurologic Exam  Mental status: The patient is alert and oriented x 3 at the time of the examination. The patient has apparent normal recent and remote memory, with an apparently normal attention span and concentration ability.   Speech is normal.  Cranial nerves: Extraocular movements are full. Pupils show 2+ left APD.  Visual fields are full. Colors and acuity are reduced OS compared to OD. Facial symmetry is present.  There is reduced left facial sensation to soft touch .Facial strength is normal.  Trapezius and sternocleidomastoid strength is normal. No dysarthria is noted.  The tongue is midline,  and the patient has symmetric elevation of the soft palate. No obvious hearing deficits are noted.  Motor:  Muscle bulk is normal.   Tone is mildly increased on legs. Strength is  5 / 5 in all 4 extremities.   Sensory: Sensory testing shows reduced right  touch and vibration sensation.  Coordination: Cerebellar testing reveals good finger-nose-finger and heel-to-shin bilaterally.  Gait and station: Station is normal.   Gait is wide Tandem gait is very wide. Romberg is negative.   Reflexes: Deep tendon reflexes are symmetric and normal bilaterally.        DIAGNOSTIC DATA (LABS, IMAGING, TESTING) - I reviewed patient records, labs, notes, testing and imaging myself where available.    ASSESSMENT AND PLAN  Multiple sclerosis (H[facility])  Transverse myelitis (H[facility])  Ataxic gait  Depression with anxiety  Dysesthesia  Insomnia  Other fatigue   1.  Change tizanidine back to soma.     Change trazodone to doxepin for sleep. 2.  Continue other medications. Ritalin and hydrocodone were renewed.   3.      Inject left trochanteric bursa with 2 mg Decadron in 2 cc Marcaine using sterile technique.   Inject left piriformis muscle with 2 mg Decadron in 4 cc Marcaine using sterile technique. She noted some numbness down the back of her leg afterwards and advised to let us know tomorrow if this does not resolve (maybe posterior fem cutaneous nerve numbed with piriformis?).   There was no weakness. 4.  She needs to stay active and exercise as tolerated.  She will return to see me and 4 months or sooner if she has new or worsening neurologic symptoms.  Grace [name] A. Felecia Shelling, MD, PhD 16/02/9603, 5:40 PM Certified in Neurology, Clinical Neurophysiology, Sleep Medicine, Pain Medicine and Neuroimaging  [facility] [address], [place], [place] [address] [phone]

## 2015-03-03 NOTE — Telephone Encounter (Signed)
Rx has been sent.  Receipt confirmed by pharmacy.  I called back.  They are aware.

## 2015-03-03 NOTE — Telephone Encounter (Signed)
Pharm. Called and wanted to know if they can use capsules instead for medication doxepin (SINEQUAN) 10 MG/ML solution . Please call [phone]

## 2015-03-03 NOTE — Telephone Encounter (Signed)
Per RAS, ok for Soma 350mg  po tid, and also ok for Sinequan 10mg  po qhs tabs instead of liquid.  I have spoken with pharmacist at [facility] took telephone order, no new rx. needed.  It appears Janett Billow has updated rx's in emr/fim

## 2015-04-14 ENCOUNTER — Other Ambulatory Visit: Payer: Self-pay | Admitting: Neurology

## 2015-04-14 MED ORDER — HYDROCODONE-ACETAMINOPHEN 5-325 MG PO TABS: 1.0000 | ORAL_TABLET | Freq: Three times a day (TID) | ORAL | Status: DC | PRN

## 2015-04-14 NOTE — Telephone Encounter (Signed)
Patient called to request refill of HYDROcodone-acetaminophen (NORCO/VICODIN) 5-325 MG tablet °

## 2015-04-14 NOTE — Telephone Encounter (Signed)
Request entered, forwarded to provider for review.  

## 2015-04-15 ENCOUNTER — Encounter: Payer: Self-pay | Admitting: *Deleted

## 2015-04-15 NOTE — Progress Notes (Signed)
Hydrocodone rx. up front [facility]/fim 

## 2015-05-27 ENCOUNTER — Telehealth: Payer: Self-pay | Admitting: Neurology

## 2015-05-27 MED ORDER — HYDROCODONE-ACETAMINOPHEN 5-325 MG PO TABS: 1.0000 | ORAL_TABLET | Freq: Three times a day (TID) | ORAL | Status: DC | PRN

## 2015-05-27 NOTE — Telephone Encounter (Signed)
Hydrocodone rx. printed, signed, up front GNA/fim 

## 2015-05-27 NOTE — Telephone Encounter (Signed)
Pt called requesting refill for HYDROcodone-acetaminophen (NORCO/VICODIN) 5-325 MG tablet .

## 2015-05-28 ENCOUNTER — Other Ambulatory Visit: Payer: Self-pay | Admitting: Nurse Practitioner

## 2015-05-28 ENCOUNTER — Other Ambulatory Visit: Payer: Self-pay | Admitting: Internal Medicine

## 2015-05-28 DIAGNOSIS — R1030 Lower abdominal pain, unspecified: Secondary | ICD-10-CM

## 2015-06-02 ENCOUNTER — Ambulatory Visit
Admission: RE | Admit: 2015-06-02 | Discharge: 2015-06-02 | Disposition: A | Discharge status: Home / Self Care | Payer: Medicare Other | Source: Ambulatory Visit | Attending: Internal Medicine | Admitting: Internal Medicine

## 2015-06-02 ENCOUNTER — Other Ambulatory Visit: Payer: Self-pay | Admitting: Internal Medicine

## 2015-06-02 DIAGNOSIS — R1030 Lower abdominal pain, unspecified: Secondary | ICD-10-CM

## 2015-06-03 ENCOUNTER — Other Ambulatory Visit: Payer: Self-pay | Admitting: Internal Medicine

## 2015-06-03 DIAGNOSIS — R1030 Lower abdominal pain, unspecified: Secondary | ICD-10-CM

## 2015-06-03 DIAGNOSIS — R1904 Left lower quadrant abdominal swelling, mass and lump: Secondary | ICD-10-CM

## 2015-07-07 ENCOUNTER — Encounter: Payer: Self-pay | Admitting: Neurology

## 2015-07-07 ENCOUNTER — Ambulatory Visit (INDEPENDENT_AMBULATORY_CARE_PROVIDER_SITE_OTHER): Payer: Medicare Other | Admitting: Neurology

## 2015-07-07 VITALS — BP 130/88 | HR 80 | Resp 16 | Ht 65.0 in | Wt 206.6 lb

## 2015-07-07 DIAGNOSIS — G4700 Insomnia, unspecified: Secondary | ICD-10-CM | POA: Diagnosis not present

## 2015-07-07 DIAGNOSIS — F418 Other specified anxiety disorders: Secondary | ICD-10-CM | POA: Diagnosis not present

## 2015-07-07 DIAGNOSIS — R208 Other disturbances of skin sensation: Secondary | ICD-10-CM | POA: Diagnosis not present

## 2015-07-07 DIAGNOSIS — G35 Multiple sclerosis: Secondary | ICD-10-CM | POA: Diagnosis not present

## 2015-07-07 DIAGNOSIS — R079 Chest pain, unspecified: Secondary | ICD-10-CM | POA: Insufficient documentation

## 2015-07-07 DIAGNOSIS — G373 Acute transverse myelitis in demyelinating disease of central nervous system: Secondary | ICD-10-CM | POA: Diagnosis not present

## 2015-07-07 DIAGNOSIS — R5383 Other fatigue: Secondary | ICD-10-CM | POA: Diagnosis not present

## 2015-07-07 DIAGNOSIS — R260 Ataxic gait: Secondary | ICD-10-CM | POA: Diagnosis not present

## 2015-07-07 DIAGNOSIS — R1030 Lower abdominal pain, unspecified: Secondary | ICD-10-CM | POA: Insufficient documentation

## 2015-07-07 DIAGNOSIS — R42 Dizziness and giddiness: Secondary | ICD-10-CM | POA: Insufficient documentation

## 2015-07-07 DIAGNOSIS — D259 Leiomyoma of uterus, unspecified: Secondary | ICD-10-CM | POA: Insufficient documentation

## 2015-07-07 MED ORDER — ALPRAZOLAM 0.5 MG PO TABS: 0.5000 mg | ORAL_TABLET | Freq: Every day | ORAL | Status: DC | PRN

## 2015-07-07 MED ORDER — HYDROCODONE-ACETAMINOPHEN 5-325 MG PO TABS: 1.0000 | ORAL_TABLET | Freq: Three times a day (TID) | ORAL | Status: DC | PRN

## 2015-07-07 MED ORDER — METHYLPHENIDATE HCL 10 MG PO TABS: 10.0000 mg | ORAL_TABLET | Freq: Three times a day (TID) | ORAL | Status: DC

## 2015-07-07 NOTE — Progress Notes (Signed)
[facility]  PATIENT: Grace [name] DOB: 01-23-1961  REFERRING DOCTOR OR PCP:  [name] SOURCE: patient  _________________________________   HISTORICAL  CHIEF COMPLAINT:  Chief Complaint  Patient presents with  . Multiple Sclerosis    Sts. skin on legs feels sunburned.  Sts. is having more pain in arms, legs, hands, feet.  Sts. fatigue is worse.  Also sts. she has been having panic attacks/fim    HISTORY OF PRESENT ILLNESS:  Grace [name] is a 55 year old woman with multiple sclerosis.     Gait/strength/sensation:  Gait is ok...rare stumbles and fell in December.   Strength is fine.  She has mild spasticity.     She has burning dysesthetic pain in the front of the legs and feet and in the the hands.   Nortriptyline, gabapentin and trileptal were not effective and Lamictal caused a rash.   Lyrica was not tolerated .   Tramadol was not well tolerated.    She reports mild left leg weakness (old) but notes her gait is slightly clumsy.    Spasticity is treated with soma.   She only has had mild benefit with several different muscle relaxants (also tried tizanidine and baclofen)  Vision:  She denies any difficulty with her vision.     Bladder: She has more urinary urgency since uterine fibroids developed.  Fatigue/sleep:   She reports physical and cognitive fatigue, helped a little bit by Ritalin 10 mg by mouth 3 times a day .   She did not receive benefit from amantadine or Provigil. Sleep is poor due to onset and maintenance insomnia.   Melatonin nightly helps he and on nights it does not help she takes zolpidem with benefit. Her periodic limb movements of sleep were improved with gabapentin.   Sleep is better on Doxepin.   She has some night sweats  Mood/cognition:   She has mild depression and some anxiety, helped by prozac and Xanax. She notes  mild cognitive dysfunction, mostly reduced short term memory and verbal fluency.   Ritalin has helped her cognition  slightly.     Hip pain:   She is noting pain in the left buttock and hip that increases with prolonged sitting or standin.   No weakness or change in sensation.  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 She had optic neuritis in 1999. In 2002, she had right-sided numbness. MRIs of the brain have always been normal or showed just a couple of nonspecific foci. A cervical spine MRI showed a focus consistent with demyelination at C5. Additionally, there are 2 smaller foci at T2 and T3-T4.  CSF was also abnormal c/w MS.     She was initially placed on Betaseron and then on Copaxone. She was unable to tolerate either of these and has not been on any DMT medication since 2003.   She retried Betaseron but stopped after diagnosis of avascular necrosis requiring hip replacement.    Of note, she had had only one course of IV steroids prior to that and has had some afterwards.   She had not wanted to go on Tysabri or Gilenya and opted to go on no disease modifying therapy since 2012. At her last visit on 01/17/2014, she decided to go on Aubagio but never started.     Most recent MRIs are from earlier this year and showed no changes.   REVIEW OF SYSTEMS: Constitutional: No fevers,  chills, sweats, or change in appetite.  Has fatigue Eyes: No visual changes, double vision, eye pain Ear, nose and throat: No hearing loss, ear pain, nasal congestion, sore throat Cardiovascular: No chest pain, palpitations Respiratory: No shortness of breath at rest or with exertion.   No wheezes GastrointestinaI: No nausea, vomiting, diarrhea, abdominal pain, fecal incontinence Genitourinary: No dysuria, urinary retention or frequency.  No nocturia. Musculoskeletal: She reports back pain and pain and some of her joints, especially her knees and hips. Integumentary: No rash, pruritus, skin lesions Neurological: as above.  She reports restless leg, insomnia and excessive  daytime sleepiness. Psychiatric: No depression at this time.  No anxiety Endocrine: No palpitations, diaphoresis, change in appetite, change in weigh.  He notes heat intolerance and excessive thirst at times. Hematologic/Lymphatic: No anemia, purpura, petechiae. Allergic/Immunologic: No itchy/runny eyes, nasal congestion, recent allergic reactions, rashes  ALLERGIES: Allergies  Allergen Reactions  . Lamictal [Lamotrigine] Shortness Of Breath, Itching and Swelling    Tongue swells  . Ultram [Tramadol Hcl] Anaphylaxis  . Codeine Itching  . Latex     Latex band-aids cause redness and tears your skin    HOME MEDICATIONS:  Current outpatient prescriptions:  .  Ascorbic Acid (VITAMIN C) 1000 MG tablet, Take 1,000 mg by mouth daily., Disp: , Rfl:  .  aspirin 81 MG tablet, Take 81 mg by mouth daily., Disp: , Rfl:  .  baclofen (LIORESAL) 10 MG tablet, Take 20 mg by mouth 3 (three) times daily., Disp: , Rfl:  .  carisoprodol (SOMA) 350 MG tablet, Take 1 tablet (350 mg total) by mouth daily., Disp: 90 tablet, Rfl: 5 .  diphenhydrAMINE (BENADRYL) 25 MG tablet, Take 25 mg by mouth 3 (three) times daily., Disp: , Rfl:  .  doxepin (SINEQUAN) 10 MG capsule, Take 1 capsule (10 mg total) by mouth at bedtime., Disp: 30 capsule, Rfl: 11 .  ergocalciferol (VITAMIN D2) 50000 UNITS capsule, Take 50,000 Units by mouth. Take twice a week, Disp: , Rfl:  .  fluticasone (FLONASE) 50 MCG/ACT nasal spray, Place 2 sprays into the nose every other day., Disp: , Rfl:  .  HYDROcodone-acetaminophen (NORCO/VICODIN) 5-325 MG tablet, Take 1 tablet by mouth 3 (three) times daily as needed for moderate pain., Disp: 90 tablet, Rfl: 0 .  loratadine-pseudoephedrine (CLARITIN-D 24 HOUR) 10-240 MG 24 hr tablet, Take 1 tablet by mouth daily., Disp: 30 tablet, Rfl: 5 .  Melatonin 5 MG TABS, Take 2 tablets by mouth at bedtime., Disp: , Rfl:  .  methylphenidate (RITALIN) 10 MG tablet, Take 1 tablet (10 mg total) by mouth 3 (three)  times daily., Disp: 90 tablet, Rfl: 0 .  rizatriptan (MAXALT-MLT) 10 MG disintegrating tablet, Take 1 tablet (10 mg total) by mouth as needed. May repeat in 2 hours if needed, Disp: 10 tablet, Rfl: 3 .  vitamin E 400 UNIT capsule, Take 400 Units by mouth daily., Disp: , Rfl:  .  zolpidem (AMBIEN) 10 MG tablet, Take 10 mg by mouth at bedtime as needed. For sleep, Disp: , Rfl:  .  ALPRAZolam (XANAX) 0.5 MG tablet, Take 1 tablet (0.5 mg total) by mouth daily as needed for anxiety. (Patient not taking: Reported on 07/07/2015), Disp: 30 tablet, Rfl: 3 .  amantadine (SYMMETREL) 100 MG capsule, Take 100 mg by mouth 2 (two) times daily. Reported on 07/07/2015, Disp: , Rfl:  .  FLUoxetine (PROZAC) 20 MG capsule, Take 20 mg by mouth daily. Reported on 07/07/2015, Disp: , Rfl:  .  gabapentin (NEURONTIN) 400 MG capsule, TAKE 1 CAPSULE BY MOUTH THREE TIMES A DAY AND 2 CAPSULES AT NIGHT. (Patient not taking: Reported on 07/07/2015), Disp: 150 capsule, Rfl: 11 .  hydrochlorothiazide (MICROZIDE) 12.5 MG capsule, Take 12.5 mg by mouth daily. Reported on 07/07/2015, Disp: , Rfl:  .  Multiple Vitamin (MULITIVITAMIN WITH MINERALS) TABS, Take 2 tablets by mouth daily. Reported on 07/07/2015, Disp: , Rfl:  .  Oxcarbazepine (TRILEPTAL) 300 MG tablet, Reported on 07/07/2015, Disp: , Rfl:  .  phentermine 37.5 MG capsule, Take 37.5 mg by mouth every morning. Reported on 07/07/2015, Disp: , Rfl:  .  progesterone (PROMETRIUM) 200 MG capsule, , Disp: , Rfl: 12 .  traZODone (DESYREL) 100 MG tablet, Take 1 tablet (100 mg total) by mouth at bedtime. (Patient not taking: Reported on 07/07/2015), Disp: 30 tablet, Rfl: 11  PAST MEDICAL HISTORY: Past Medical History  Diagnosis Date  . OSA (obstructive sleep apnea)   . Multiple sclerosis ([place])     dx in 11/1996  . Hand fracture, left     9/87  . Raynaud's phenomenon   . Diverticulosis   . Numbness and tingling in hands   . Migraines   . Lhermitte's syndrome   . Avascular necrosis  ([place])   . Neuropathy ([place])     PAST SURGICAL HISTORY: Past Surgical History  Procedure Laterality Date  . Cholecystectomy    . Colonoscopy      1997/2001  . Breast biopsy      core/left breast  . Laparoscopic abdominal exploration      ovaries and intestines bond together  . Lumbar puncture      11/01/1996  . Total hip arthroplasty      left hip  6/02  /right hip12/04  . Left heart catheterization with coronary angiogram N/A 06/21/2011    Procedure: LEFT HEART CATHETERIZATION WITH CORONARY ANGIOGRAM;  Surgeon: Leonie Man, MD;  Location[facility] CATH LAB;  Service: Cardiovascular;  Laterality: N/A;    FAMILY HISTORY: Family History  Problem Relation Age of Onset  . Pancreatic cancer Mother   . Stroke Mother   . Testicular cancer Brother   . Colon cancer Maternal Uncle   . Pancreatic cancer Paternal Uncle   . Prostate cancer Maternal Grandfather   . Colon polyps Father     SOCIAL HISTORY:  Social History   Social History  . Marital Status: Married    Spouse Name: N/A  . Number of Children: N/A  . Years of Education: N/A   Occupational History  . Not on file.   Social History Main Topics  . Smoking status: Former Research scientist (life sciences)  . Smokeless tobacco: Never Used  . Alcohol Use: No  . Drug Use: No  . Sexual Activity: Not on file   Other Topics Concern  . Not on file   Social History Narrative     PHYSICAL EXAM  Filed Vitals:   07/07/15 1313  BP: 130/88  Pulse: 80  Resp: 16  Height: 5\' 5"  (1.651 m)  Weight: 206 lb 9.6 oz (93.713 kg)    Body mass index is 34.38 kg/(m^2).   General: The patient is well-developed and well-nourished and in no acute distress  Neurologic Exam  Mental status: The patient is alert and oriented x 3 at the time of the examination. The patient has apparent normal recent and remote memory, with an apparently normal attention span and concentration ability.   Speech is normal.  Cranial nerves: Extraocular movements are full. Pupils  show 2+ left APD.  Visual fields are full. Colors and acuity are reduced OS compared to OD. Facial symmetry is present. There is reduced left facial sensation to soft touch .Facial strength is normal.  Trapezius and sternocleidomastoid strength is normal. No dysarthria is noted.  The tongue is midline, and the patient has symmetric elevation of the soft palate. No obvious hearing deficits are noted.  Motor:  Muscle bulk is normal.   Tone is mildly increased on legs. Strength is  5 / 5 in all 4 extremities.   Sensory: Sensory testing shows reduced right  touch and vibration sensation.  Coordination: Cerebellar testing reveals good finger-nose-finger and heel-to-shin bilaterally.  Gait and station: Station is normal.   Gait is wide Tandem gait is very wide. Romberg is negative.   Reflexes: Deep tendon reflexes are symmetric and normal bilaterally.        DIAGNOSTIC DATA (LABS, IMAGING, TESTING) - I reviewed patient records, labs, notes, testing and imaging myself where available.    ASSESSMENT AND PLAN  Multiple sclerosis (HCC)  Transverse myelitis (HCC)  Ataxic gait  Depression with anxiety  Dysesthesia  Insomnia  Other fatigue   1.  Xanax prn anxiety 2.  Continue other medications. Ritalin and hydrocodone were renewed.   3.  She needs to stay active and exercise as tolerated.  She will return to see me and 4 months or sooner if she has new or worsening neurologic symptoms.  Olivia Royse A. Felecia Shelling, MD, PhD [id number], [id number] PM Certified in Neurology, Clinical Neurophysiology, Sleep Medicine, Pain Medicine and Neuroimaging  [facility] [address] [place], [place] [address] [phone]

## 2015-09-04 ENCOUNTER — Other Ambulatory Visit: Payer: Self-pay | Admitting: Neurology

## 2015-09-04 ENCOUNTER — Telehealth: Payer: Self-pay | Admitting: Neurology

## 2015-09-04 MED ORDER — HYDROCODONE-ACETAMINOPHEN 5-325 MG PO TABS: 1.0000 | ORAL_TABLET | Freq: Three times a day (TID) | ORAL | Status: DC | PRN

## 2015-09-04 NOTE — Telephone Encounter (Signed)
Rx. awaiting RAS sig/fim 

## 2015-09-04 NOTE — Telephone Encounter (Signed)
Patient is calling to get a written Rx for HYDROcodone-acetaminophen (NORCO/VICODIN) 5-325 MG tablet. I advised the Rx will be ready in 24 hours (Monday) unless the nurse advises otherwise and I also told her Dr. Felecia Shelling is out of the office until Monday.  The patient states she will pick up the Rx on Monday.

## 2015-09-08 NOTE — Telephone Encounter (Signed)
Hydrocodone rx. up front GNA/fim 

## 2015-09-12 ENCOUNTER — Telehealth: Payer: Self-pay | Admitting: *Deleted

## 2015-09-12 NOTE — Telephone Encounter (Signed)
Pt need letter to get out of jury dury because of ms. Please call and advised [phone]

## 2015-09-15 NOTE — Telephone Encounter (Signed)
Attempted to contact Grace [name], but vm has not been set up/ unable to leave message/fim

## 2015-09-18 NOTE — Telephone Encounter (Signed)
Someone answered at the number listed below and stated I had called the wrong number.

## 2015-09-24 NOTE — Telephone Encounter (Signed)
Patient is calling back regarding a letter she needs to be dismissed from jury duty. She can be reached at [phone].

## 2015-09-25 ENCOUNTER — Encounter: Payer: Self-pay | Admitting: *Deleted

## 2015-09-25 NOTE — Telephone Encounter (Signed)
Letter up front [facility]NA/fim 

## 2015-09-25 NOTE — Telephone Encounter (Signed)
Letter printed and awaiting RAS sig.  I have spoken with Grace [name] and advised that letter will be ready to be picked up in the office  by tomorrow am./fim

## 2015-10-14 ENCOUNTER — Telehealth: Payer: Self-pay | Admitting: *Deleted

## 2015-10-14 MED ORDER — BACLOFEN 10 MG PO TABS: 20.0000 mg | ORAL_TABLET | Freq: Three times a day (TID) | ORAL | Status: DC

## 2015-10-14 MED ORDER — HYDROCODONE-ACETAMINOPHEN 5-325 MG PO TABS: 1.0000 | ORAL_TABLET | Freq: Three times a day (TID) | ORAL | Status: DC | PRN

## 2015-10-14 NOTE — Telephone Encounter (Signed)
Hydrocodone rx. up front GNA/fim 

## 2015-10-14 NOTE — Telephone Encounter (Signed)
Pt request refill on HYDROcodone-acetaminophen (NORCO/VICODIN) 5-325 MG tablet and baclofen (LIORESAL) 10 MG tablet. Please advise. [phone]

## 2015-10-14 NOTE — Telephone Encounter (Signed)
Baclofen escribed to [facility].  Hydrocodone rx. awaiting RAS sig/fim

## 2015-10-15 ENCOUNTER — Ambulatory Visit
Admission: RE | Admit: 2015-10-15 | Discharge: 2015-10-15 | Disposition: A | Discharge status: Home / Self Care | Payer: Medicare Other | Source: Ambulatory Visit | Attending: Internal Medicine | Admitting: Internal Medicine

## 2015-10-15 ENCOUNTER — Other Ambulatory Visit: Payer: Self-pay | Admitting: Internal Medicine

## 2015-10-15 DIAGNOSIS — R059 Cough, unspecified: Secondary | ICD-10-CM

## 2015-10-15 DIAGNOSIS — R05 Cough: Secondary | ICD-10-CM

## 2015-10-20 ENCOUNTER — Telehealth: Payer: Self-pay | Admitting: Cardiovascular Disease

## 2015-10-20 NOTE — Telephone Encounter (Signed)
Received records from [facility] for appointment on 11/28/15 with Dr Oval Linsey.  Records given to [facility] (medical records) for Dr Blenda Mounts schedule on 11/28/15. lp

## 2015-10-29 ENCOUNTER — Other Ambulatory Visit: Payer: Self-pay | Admitting: Neurology

## 2015-11-28 ENCOUNTER — Encounter: Payer: Self-pay | Admitting: Cardiovascular Disease

## 2015-11-28 ENCOUNTER — Ambulatory Visit (INDEPENDENT_AMBULATORY_CARE_PROVIDER_SITE_OTHER): Payer: Medicare Other | Admitting: Cardiovascular Disease

## 2015-11-28 VITALS — BP 115/70 | HR 75 | Wt 214.0 lb

## 2015-11-28 DIAGNOSIS — R002 Palpitations: Secondary | ICD-10-CM | POA: Diagnosis not present

## 2015-11-28 DIAGNOSIS — R000 Tachycardia, unspecified: Secondary | ICD-10-CM | POA: Diagnosis not present

## 2015-11-28 DIAGNOSIS — R072 Precordial pain: Secondary | ICD-10-CM | POA: Diagnosis not present

## 2015-11-28 LAB — CBC
HEMATOCRIT: 39.2 % (ref 35.0–45.0)
HEMOGLOBIN: 13.6 g/dL (ref 11.7–15.5)
MCH: 31.8 pg (ref 27.0–33.0)
MCHC: 34.7 g/dL (ref 32.0–36.0)
MCV: 91.6 fL (ref 80.0–100.0)
MPV: 10.2 fL (ref 7.5–12.5)
Platelets: 360 10*3/uL (ref 140–400)
RBC: 4.28 MIL/uL (ref 3.80–5.10)
RDW: 13.7 % (ref 11.0–15.0)
WBC: 6.5 10*3/uL (ref 3.8–10.8)

## 2015-11-28 LAB — TSH: TSH: 2.8 mIU/L

## 2015-11-28 LAB — T4, FREE: Free T4: 1 ng/dL (ref 0.8–1.8)

## 2015-11-28 NOTE — Progress Notes (Signed)
Cardiology Office Note   Date:  12/01/2015   ID:  Grace [name], DOB 11-Dec-1960, MRN [id number]  PCP:  [name], MD  Cardiologist:   Skeet Latch, MD   Chief Complaint  Patient presents with  . New Patient (Initial Visit)    get established. former patient of Grace Grace [name].      History of Present Illness: Grace [name] is a 55 y.o. female with MS, transverse myelitis, and chest pain who presents for evaluation of an abnormal EKG.  Grace [name] saw Grace. Glendale [name] on 10/15/15.  At that time she had an EKG that reported out as atrial flutter but actually revealed sinus tachycardia at a rate of 111 bpm.  She reports having a viral illness at the time.  She was referred to cardiology for further evaluation.  Grace [name] sometimes feels as though there is an elephant sitting on her chest. This is been ongoing for the last 3 months and occurs daily. It typically occurs when she is sitting but not when laying down. There is no associated shortness of breath or nausea, but she does endorse diaphoresis and headache. The episodes last for approximately 10 minutes. It does not occur with exertion.  Of note, in 2013 Grace [name] had a Myoview that was negative for ischemia.  However, she continued to have chest pain and subsequently underwent LHC 06/2011 that revealed no CAD.  Grace [name] also notes palpitations that occur 2-3 times per day and last for approximately 10 minutes. There is no clear association. She was previously tried on metoprolol but did not tolerate that medication due to itching.  She notes lower extremity edema, but no orthopnea or PND.  Grace [name] was diagnosed with fibroids 6 months ago and needs to have surgery. However, she is having a hard time scheduling this because her husband had a stroke and became paralyzed on the left side 2 years ago. She is his primary caretaker and doesn't have anyone to help her during the recovery period.   Past Medical History:  Diagnosis  Date  . Avascular necrosis (Grace [name])   . Disorder of soft tissue   . Diverticulosis   . Hand fracture, left    9/87  . Hyperlipidemia   . Lhermitte's syndrome   . Migraines   . Multiple sclerosis ([name])    dx in 11/1996  . Multiple sclerosis ([name])   . Neuropathy ([place])   . Numbness and tingling in hands   . Obesity   . OSA (obstructive sleep apnea)   . Otitis media   . Raynaud's phenomenon   . Vaginitis and vulvovaginitis   . Vitamin D deficiency     Past Surgical History:  Procedure Laterality Date  . BREAST BIOPSY     core/left breast  . CHOLECYSTECTOMY    . COLONOSCOPY     1997/2001  . LAPAROSCOPIC ABDOMINAL EXPLORATION     ovaries and intestines bond together  . LEFT HEART CATHETERIZATION WITH CORONARY ANGIOGRAM N/A 06/21/2011   Procedure: LEFT HEART CATHETERIZATION WITH CORONARY ANGIOGRAM;  Surgeon: Leonie Man, MD;  Location: [facility] CATH LAB;  Service: Cardiovascular;  Laterality: N/A;  . LUMBAR PUNCTURE     11/01/1996  . TOTAL HIP ARTHROPLASTY     left hip  6/02  /right hip12/04     Current Outpatient Prescriptions  Medication Sig Dispense Refill  . ALPRAZolam (XANAX) 0.5 MG tablet Take 1 tablet (0.5 mg total) by mouth daily as needed for anxiety. [place]  tablet 3  . Ascorbic Acid (VITAMIN C) 1000 MG tablet Take 1,000 mg by mouth daily.    [name] Kitchen aspirin 81 MG tablet Take 81 mg by mouth daily.    . baclofen (LIORESAL) 10 MG tablet Take 2 tablets (20 mg total) by mouth 3 (three) times daily. 180 each 11  . carisoprodol (SOMA) 350 MG tablet TAKE 1 TABLET BY MOUTH 3 TIMES A DAY. 90 tablet 5  . diphenhydrAMINE (BENADRYL) 25 MG tablet Take 25 mg by mouth 3 (three) times daily.    [name] Kitchen doxepin (SINEQUAN) 10 MG capsule Take 1 capsule (10 mg total) by mouth at bedtime. 30 capsule 11  . ergocalciferol (VITAMIN D2) 50000 UNITS capsule Take 50,000 Units by mouth. Take twice a week    . fluticasone (FLONASE) 50 MCG/ACT nasal spray Place 2 sprays into the nose every other day.    [name] Kitchen  HYDROcodone-acetaminophen (NORCO/VICODIN) 5-325 MG tablet Take 1 tablet by mouth 3 (three) times daily as needed for moderate pain. 90 tablet 0  . HYDROcodone-homatropine (HYCODAN) 5-1.5 MG/5ML syrup Take 5 mLs by mouth every 6 (six) hours as needed for cough.    . loratadine-pseudoephedrine (CLARITIN-D 24 HOUR) 10-240 MG 24 hr tablet Take 1 tablet by mouth daily. 30 tablet 5  . Melatonin 5 MG TABS Take 2 tablets by mouth at bedtime.    . mometasone (NASONEX) 50 MCG/ACT nasal spray Place 2 sprays into the nose daily.    . progesterone (PROMETRIUM) 200 MG capsule   12  . rizatriptan (MAXALT-MLT) 10 MG disintegrating tablet DISSOLVE 1 TABLET ON TONGUE AS NEEDED. MAY REPEAT IN 2 HOURS IF NEEDED. 10 tablet 11  . tiZANidine (ZANAFLEX) 2 MG tablet Take 2 mg by mouth every 6 (six) hours as needed for muscle spasms.    . Turmeric 1053 MG TABS Take 1,053 mg by mouth daily.    . vitamin E 400 UNIT capsule Take 400 Units by mouth daily.    [name] Kitchen zolpidem (AMBIEN) 10 MG tablet Take 10 mg by mouth at bedtime as needed. For sleep     No current facility-administered medications for this visit.     Allergies:   Lamictal [lamotrigine]; Ultram [tramadol hcl]; Codeine; and Latex    Social History:  The patient  reports that she has quit smoking. She has never used smokeless tobacco. She reports that she does not drink alcohol or use drugs.   Family History:  The patient's family history includes Colon cancer in her maternal uncle; Colon polyps in her father; Pancreatic cancer in her mother and paternal uncle; Prostate cancer in her maternal grandfather; Stroke in her mother; Testicular cancer in her brother.    ROS:  Please see the history of present illness.   Otherwise, review of systems are positive for none.   All other systems are reviewed and negative.    PHYSICAL EXAM: VS:  BP 115/70   Pulse 75   Wt 214 lb (97.1 kg)   LMP 04/09/2013   BMI 35.61 kg/m   , BMI Body mass index is 35.61 kg/m. GENERAL:   Well appearing HEENT:  Pupils equal round and reactive, fundi not visualized, oral mucosa unremarkable NECK:  No jugular venous distention, waveform within normal limits, carotid upstroke brisk and symmetric, no bruits, no thyromegaly LYMPHATICS:  No cervical adenopathy LUNGS:  Clear to auscultation bilaterally HEART:  RRR.  PMI not displaced or sustained,S1 and S2 within normal limits, no S3, no S4, no clicks, no rubs, no murmurs ABD:  Flat, positive bowel sounds normal in frequency in pitch, no bruits, no rebound, no guarding, no midline pulsatile mass, no hepatomegaly, no splenomegaly EXT:  2 plus pulses throughout, no edema, no cyanosis no clubbing SKIN:  No rashes no nodules NEURO:  Cranial nerves II through XII grossly intact, motor grossly intact throughout PSYCH:  Cognitively intact, oriented to person place and time    EKG:  EKG is ordered today. The ekg ordered today demonstrates sinus rhythm. Rate 75 bpm. 10/15/15: Sinus tachycardia.  Rate 111 bpm.   Recent Labs: 11/28/2015: Hemoglobin 13.6; Platelets 360; TSH 2.80   10/15/15: Na 144, K 4.5, BUN 13, creatinine 0.71 Magnesium 2.2   Lipid Panel No results found for: CHOL, TRIG, HDL, CHOLHDL, VLDL, LDLCALC, LDLDIRECT    Wt Readings from Last 3 Encounters:  11/28/15 214 lb (97.1 kg)  07/07/15 206 lb 9.6 oz (93.7 kg)  03/03/15 197 lb 12.8 oz (89.7 kg)      ASSESSMENT AND PLAN:  # Sinus tachycardia: On review of the EKG that Ms. Trick had with her PCP, it seems as though it was actually sinus tachycardia rather than atrial flutter. She had prominent T waves. She is currently not having any more sinus tachycardia and at the time the EKG was taken she had cold symptoms. This was likely the cause of her sinus tachycardia and no further workup is indicated at this time.  She does report palpitations so we will check a TSH, free T4, and CBC.  # Atypical chest pain: Ms. Scarfo reports symptoms of atypical chest pain. She had a  negative cardiac workup in 2013. However, it has been several years and she is going to be undergoing surgery soon. She does not think that she would be able to walk on the treadmill. Therefore, we will obtain a Lexiscan Myoview to evaluate for ischemia. CBC, thyroid   Current medicines are reviewed at length with the patient today.  The patient does not have concerns regarding medicines.  The following changes have been made:  no change  Labs/ tests ordered today include:   Orders Placed This Encounter  Procedures  . TSH  . T4, free  . CBC  . Myocardial Perfusion Imaging  . EKG 12-Lead     Disposition:   FU with Via Rosado C. Oval Linsey, MD, [facility] in 3 months.    This note was written with the assistance of speech recognition software.  Please excuse any transcriptional errors.  Signed, Tambria Pfannenstiel C. Oval Linsey, MD, [facility]  12/01/2015 10:42 AM    [facility]

## 2015-11-28 NOTE — Patient Instructions (Signed)
Lab work  Your physician recommends that you return for lab work.    Procedures  Your physician has requested that you have a lexiscan myoview. For further information please visit [email or web address].     Follow-up  Your physician recommends that you schedule a follow-up appointment in: 3 months with Dr. Oval Linsey.  If you need a refill on your cardiac medications before your next appointment, please call your pharmacy.

## 2015-12-03 ENCOUNTER — Telehealth (HOSPITAL_COMMUNITY): Payer: Self-pay

## 2015-12-03 NOTE — Telephone Encounter (Signed)
Encounter complete. 

## 2015-12-04 ENCOUNTER — Encounter: Payer: Self-pay | Admitting: Neurology

## 2015-12-04 ENCOUNTER — Ambulatory Visit (INDEPENDENT_AMBULATORY_CARE_PROVIDER_SITE_OTHER): Payer: Medicare Other | Admitting: Neurology

## 2015-12-04 VITALS — BP 188/86 | HR 74 | Resp 18 | Wt 216.0 lb

## 2015-12-04 DIAGNOSIS — R5383 Other fatigue: Secondary | ICD-10-CM

## 2015-12-04 DIAGNOSIS — R208 Other disturbances of skin sensation: Secondary | ICD-10-CM | POA: Diagnosis not present

## 2015-12-04 DIAGNOSIS — F418 Other specified anxiety disorders: Secondary | ICD-10-CM

## 2015-12-04 DIAGNOSIS — R260 Ataxic gait: Secondary | ICD-10-CM | POA: Diagnosis not present

## 2015-12-04 DIAGNOSIS — G35 Multiple sclerosis: Secondary | ICD-10-CM | POA: Diagnosis not present

## 2015-12-04 DIAGNOSIS — G4700 Insomnia, unspecified: Secondary | ICD-10-CM | POA: Diagnosis not present

## 2015-12-04 MED ORDER — DOXEPIN HCL 10 MG PO CAPS: ORAL_CAPSULE | ORAL | 11 refills | Status: DC

## 2015-12-04 MED ORDER — HYDROCODONE-ACETAMINOPHEN 5-325 MG PO TABS: 1.0000 | ORAL_TABLET | Freq: Three times a day (TID) | ORAL | 0 refills | Status: DC | PRN

## 2015-12-04 NOTE — Progress Notes (Signed)
[facility]  PATIENT: Grace [name] DOB: Jan 24, 1961  REFERRING DOCTOR OR PCP:  Grace [name] SOURCE: patient  _________________________________   HISTORICAL  CHIEF COMPLAINT:  Chief Complaint  Patient presents with  . Multiple Sclerosis    Sts. since last ov she's had chest pain, palpitations.  She has been eval by cardiology and is scheduled for a stress test tomorrow./fim    HISTORY OF PRESENT ILLNESS:  Grace [name] is a 55 year old woman with multiple sclerosis.   She feels memory is worse this year.  She also notes a lot of stress.    Gait/strength/sensation:  Gait is stable with some stumbles but no recent falls.    Strength is fine.  She has mild spasticity.     She has burning dysesthetic pain in the front of the legs and feet and in the the hands (lLeft = Right).   Nortriptyline, gabapentin and trileptal were not effective and Lamictal caused a rash.   Lyrica was not tolerated .   Tramadol was not well tolerated.    She reports mild left leg weakness (old) but notes her gait is slightly clumsy.    Spasticity is helped with soma, tizanidine and baclofen.  Neck pain:   She notes more pain and discomfort in the right neck.     Vision:  She denies any change with her vision with mild left blurriness.        Bladder: She has more urinary urgency since uterine fibroids developed.  Fatigue/sleep:   She reports physical and cognitive fatigue, not helped much by Ritalin 10 mg by mouth 3 times a day .   She did not receive benefit from amantadine or Provigil. Sleep is poor due to onset and maintenance insomnia.   Zolpidem has with benefit. Her periodic limb movements of sleep were improved with gabapentin.   Doxepin seems to help some nights but not every night.     She has some night sweats  Mood/cognition:   She has depression and some anxiety, not helped as much by prozac and Xanax as in the past.    She is very irritable.  . She notes  mild cognitive  dysfunction, mostly reduced short term memory and verbal fluency.   Ritalin has helped her cognition slightly.     Hip pain:   She is noting pain in the left buttock and hip that increases with prolonged sitting or standin.   No weakness or change in sensation.  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 She had optic neuritis in 1999. In 2002, she had right-sided numbness. MRIs of the brain have always been normal or showed just a couple of nonspecific foci. A cervical spine MRI showed a focus consistent with demyelination at C5. Additionally, there are 2 smaller foci at T2 and T3-T4.  CSF was also abnormal c/w MS.     She was initially placed on Betaseron and then on Copaxone. She was unable to tolerate either of these and has not been on any DMT medication since 2003.   She retried Betaseron but stopped after diagnosis of avascular necrosis requiring hip replacement.    Of note, she had had only one course of IV steroids prior to that and has had some afterwards.   She had not wanted to go on Tysabri or Gilenya and opted to go on no disease modifying therapy since 2012. At her last visit on  01/17/2014, she decided to go on Aubagio but never started.     Most recent MRIs are from earlier this year and showed no changes.   REVIEW OF SYSTEMS: Constitutional: No fevers, chills, sweats, or change in appetite.  Has fatigue Eyes: No visual changes, double vision, eye pain Ear, nose and throat: No hearing loss, ear pain, nasal congestion, sore throat Cardiovascular: No chest pain, palpitations Respiratory: No shortness of breath at rest or with exertion.   No wheezes GastrointestinaI: No nausea, vomiting, diarrhea, abdominal pain, fecal incontinence Genitourinary: No dysuria, urinary retention or frequency.  No nocturia. Musculoskeletal: She reports back pain and pain and some of her joints, especially her knees and hips. Integumentary: No rash,  pruritus, skin lesions Neurological: as above.  She reports restless leg, insomnia and excessive daytime sleepiness. Psychiatric: No depression at this time.  No anxiety Endocrine: No palpitations, diaphoresis, change in appetite, change in weigh.  He notes heat intolerance and excessive thirst at times. Hematologic/Lymphatic: No anemia, purpura, petechiae. Allergic/Immunologic: No itchy/runny eyes, nasal congestion, recent allergic reactions, rashes  ALLERGIES: Allergies  Allergen Reactions  . Lamictal [Lamotrigine] Shortness Of Breath, Itching and Swelling    Tongue swells  . Ultram [Tramadol Hcl] Anaphylaxis  . Codeine Itching  . Latex     Latex band-aids cause redness and tears your skin    HOME MEDICATIONS:  Current Outpatient Prescriptions:  .  ALPRAZolam (XANAX) 0.5 MG tablet, Take 1 tablet (0.5 mg total) by mouth daily as needed for anxiety., Disp: 30 tablet, Rfl: 3 .  Ascorbic Acid (VITAMIN C) 1000 MG tablet, Take 1,000 mg by mouth daily., Disp: , Rfl:  .  aspirin 81 MG tablet, Take 81 mg by mouth daily., Disp: , Rfl:  .  baclofen (LIORESAL) 10 MG tablet, Take 2 tablets (20 mg total) by mouth 3 (three) times daily., Disp: 180 each, Rfl: 11 .  carisoprodol (SOMA) 350 MG tablet, TAKE 1 TABLET BY MOUTH 3 TIMES A DAY., Disp: 90 tablet, Rfl: 5 .  diphenhydrAMINE (BENADRYL) 25 MG tablet, Take 25 mg by mouth 3 (three) times daily., Disp: , Rfl:  .  doxepin (SINEQUAN) 10 MG capsule, Take 1 capsule (10 mg total) by mouth at bedtime., Disp: 30 capsule, Rfl: 11 .  ergocalciferol (VITAMIN D2) 50000 UNITS capsule, Take 50,000 Units by mouth. Take twice a week, Disp: , Rfl:  .  fluticasone (FLONASE) 50 MCG/ACT nasal spray, Place 2 sprays into the nose every other day., Disp: , Rfl:  .  HYDROcodone-acetaminophen (NORCO/VICODIN) 5-325 MG tablet, Take 1 tablet by mouth 3 (three) times daily as needed for moderate pain., Disp: 90 tablet, Rfl: 0 .  HYDROcodone-homatropine (HYCODAN) 5-1.5 MG/5ML  syrup, Take 5 mLs by mouth every 6 (six) hours as needed for cough., Disp: , Rfl:  .  loratadine-pseudoephedrine (CLARITIN-D 24 HOUR) 10-240 MG 24 hr tablet, Take 1 tablet by mouth daily., Disp: 30 tablet, Rfl: 5 .  Melatonin 5 MG TABS, Take 2 tablets by mouth at bedtime., Disp: , Rfl:  .  mometasone (NASONEX) 50 MCG/ACT nasal spray, Place 2 sprays into the nose daily., Disp: , Rfl:  .  progesterone (PROMETRIUM) 200 MG capsule, , Disp: , Rfl: 12 .  rizatriptan (MAXALT-MLT) 10 MG disintegrating tablet, DISSOLVE 1 TABLET ON TONGUE AS NEEDED. MAY REPEAT IN 2 HOURS IF NEEDED., Disp: 10 tablet, Rfl: 11 .  tiZANidine (ZANAFLEX) 2 MG tablet, Take 2 mg by mouth every 6 (six) hours as needed for muscle spasms., Disp: , Rfl:  .  Turmeric 1053 MG TABS, Take 1,053 mg by mouth daily., Disp: , Rfl:  .  vitamin E 400 UNIT capsule, Take 400 Units by mouth daily., Disp: , Rfl:  .  zolpidem (AMBIEN) 10 MG tablet, Take 10 mg by mouth at bedtime as needed. For sleep, Disp: , Rfl:   PAST MEDICAL HISTORY: Past Medical History:  Diagnosis Date  . Avascular necrosis ([place])   . Disorder of soft tissue   . Diverticulosis   . Hand fracture, left    9/87  . Hyperlipidemia   . Lhermitte's syndrome   . Migraines   . Multiple sclerosis ([place])    dx in 11/1996  . Multiple sclerosis ([place])   . Neuropathy ([place])   . Numbness and tingling in hands   . Obesity   . OSA (obstructive sleep apnea)   . Otitis media   . Raynaud's phenomenon   . Vaginitis and vulvovaginitis   . Vitamin D deficiency     PAST SURGICAL HISTORY: Past Surgical History:  Procedure Laterality Date  . BREAST BIOPSY     core/left breast  . CHOLECYSTECTOMY    . COLONOSCOPY     1997/2001  . LAPAROSCOPIC ABDOMINAL EXPLORATION     ovaries and intestines bond together  . LEFT HEART CATHETERIZATION WITH CORONARY ANGIOGRAM N/A 06/21/2011   Procedure: LEFT HEART CATHETERIZATION WITH CORONARY ANGIOGRAM;  Surgeon: Leonie Man, MD;  Location: [facility] CATH  LAB;  Service: Cardiovascular;  Laterality: N/A;  . LUMBAR PUNCTURE     11/01/1996  . TOTAL HIP ARTHROPLASTY     left hip  6/02  /right hip12/04    FAMILY HISTORY: Family History  Problem Relation Age of Onset  . Pancreatic cancer Mother   . Stroke Mother   . Testicular cancer Brother   . Colon cancer Maternal Uncle   . Pancreatic cancer Paternal Uncle   . Prostate cancer Maternal Grandfather   . Colon polyps Father     SOCIAL HISTORY:  Social History   Social History  . Marital status: Married    Spouse name: N/A  . Number of children: N/A  . Years of education: N/A   Occupational History  . Not on file.   Social History Main Topics  . Smoking status: Former Research scientist (life sciences)  . Smokeless tobacco: Never Used  . Alcohol use No  . Drug use: No  . Sexual activity: Not on file   Other Topics Concern  . Not on file   Social History Narrative  . No narrative on file     PHYSICAL EXAM  Vitals:   12/04/15 1437  BP: (!) 188/86  Pulse: 74  Resp: 18  Weight: 216 lb (98 kg)    Body mass index is 35.94 kg/m.   General: The patient is well-developed and well-nourished and in no acute distress  Neurologic Exam  Mental status: The patient is alert and oriented x 3 at the time of the examination. The patient has apparent normal recent and remote memory, with an apparently normal attention span and concentration ability.   Speech is normal.  Cranial nerves: Extraocular movements are full. Pupils show 2+ left APD.  Visual fields are full. Colors and acuity are reduced OS compared to OD. Facial symmetry is present. There is reduced left facial sensation to soft touch .Facial strength is normal.  Trapezius and sternocleidomastoid strength is normal. No dysarthria is noted.  The tongue is midline, and the patient has symmetric elevation of the soft palate. No obvious hearing  deficits are noted.  Motor:  Muscle bulk is normal.   Tone is mildly increased in legs, left > right.  Strength is  5 / 5 in all 4 extremities except 4+/5 left EHL  Sensory: Sensory testing shows reduced right touch and vibration sensation.  Coordination: Cerebellar testing reveals good finger-nose-finger and heel-to-shin bilaterally.  Gait and station: Station is normal.   Gait is wide Tandem gait is very wide. Romberg is negative.   Reflexes: Deep tendon reflexes are symmetric and normal bilaterally.        DIAGNOSTIC DATA (LABS, IMAGING, TESTING) - I reviewed patient records, labs, notes, testing and imaging myself where available.    ASSESSMENT AND PLAN  Multiple sclerosis (HCC)  Ataxic gait  Other fatigue  Dysesthesia  Depression with anxiety  Insomnia   1.  Increase doxepin to 20 mg nightly.   Renew hydrocodone.     2.  Continue other medications. 3.  She needs to stay active and exercise as tolerated.  She will return to see me and 6 months or sooner if she has new or worsening neurologic symptoms.  Sharyl Panchal A. Felecia Shelling, MD, PhD [id number], 0000000 PM Certified in Neurology, Clinical Neurophysiology, Sleep Medicine, Pain Medicine and Neuroimaging  [facility] [address] [place], [place] [address] [phone]

## 2015-12-05 ENCOUNTER — Ambulatory Visit (HOSPITAL_COMMUNITY)
Admission: RE | Admit: 2015-12-05 | Discharge: 2015-12-05 | Disposition: A | Discharge status: Home / Self Care | Payer: Medicare Other | Source: Ambulatory Visit | Attending: Cardiovascular Disease | Admitting: Cardiovascular Disease

## 2015-12-05 DIAGNOSIS — R072 Precordial pain: Secondary | ICD-10-CM | POA: Diagnosis not present

## 2015-12-05 LAB — MYOCARDIAL PERFUSION IMAGING
CHL CUP NUCLEAR SRS: 1
CHL CUP NUCLEAR SSS: 5
CSEPPHR: 104 {beats}/min
LV dias vol: 96 mL (ref 46–106)
LV sys vol: 35 mL
Rest HR: 66 {beats}/min
SDS: 4
TID: 1.13

## 2015-12-05 MED ORDER — REGADENOSON 0.4 MG/5ML IV SOLN
0.4000 mg | Freq: Once | INTRAVENOUS | Status: AC
  Administered 2015-12-05: 0.4 mg via INTRAVENOUS

## 2015-12-05 MED ORDER — AMINOPHYLLINE 25 MG/ML IV SOLN
75.0000 mg | Freq: Once | INTRAVENOUS | Status: AC
  Administered 2015-12-05: 75 mg via INTRAVENOUS

## 2015-12-05 MED ORDER — TECHNETIUM TC 99M TETROFOSMIN IV KIT
31.2000 | PACK | Freq: Once | INTRAVENOUS | Status: AC | PRN
  Administered 2015-12-05: 31.2 via INTRAVENOUS
  Filled 2015-12-05: qty 31

## 2015-12-05 MED ORDER — TECHNETIUM TC 99M TETROFOSMIN IV KIT
10.5000 | PACK | Freq: Once | INTRAVENOUS | Status: AC | PRN
  Administered 2015-12-05: 10.5 via INTRAVENOUS
  Filled 2015-12-05: qty 11

## 2015-12-09 ENCOUNTER — Telehealth: Payer: Self-pay | Admitting: Cardiovascular Disease

## 2015-12-09 DIAGNOSIS — R002 Palpitations: Secondary | ICD-10-CM

## 2015-12-09 NOTE — Telephone Encounter (Signed)
F/u Message  Pt states she received a call from the office. Please call back to discuss

## 2015-12-09 NOTE — Telephone Encounter (Signed)
Patient remains concerned about her palpations.  States she has them several times a day 4-5 times a week Will forward to Dr Oval Linsey for review   Advised patient of results   Notes Recorded by Skeet Latch, MD on 12/09/2015 at 10:05 AM EDT Normal thyroid function and blood counts.  Notes Recorded by Skeet Latch, MD on 12/08/2015 at 5:33 PM EDT Low risk stress test.

## 2015-12-10 NOTE — Telephone Encounter (Signed)
Please have her wear a 7 day event monitor.

## 2015-12-16 ENCOUNTER — Ambulatory Visit (INDEPENDENT_AMBULATORY_CARE_PROVIDER_SITE_OTHER): Payer: Medicare Other

## 2015-12-16 DIAGNOSIS — R002 Palpitations: Secondary | ICD-10-CM | POA: Diagnosis not present

## 2016-01-02 ENCOUNTER — Telehealth: Payer: Self-pay | Admitting: Cardiovascular Disease

## 2016-01-02 NOTE — Telephone Encounter (Signed)
Returned call to patient-requesting 7 day monitor results.  Pt made aware that test needs to be reviewed by MD Oval Linsey.  Pt verbalized understanding.

## 2016-01-02 NOTE — Telephone Encounter (Signed)
New Message  Pt voiced she is waiting on her results from MD [facility].  Please follow up with pt. Thanks!

## 2016-01-20 ENCOUNTER — Telehealth: Payer: Self-pay | Admitting: Cardiovascular Disease

## 2016-01-20 NOTE — Telephone Encounter (Signed)
New message    Pt states she is returning doctor call. Please call.

## 2016-01-20 NOTE — Telephone Encounter (Signed)
Returned call to patient-made aware of monitor results:  Notes Recorded by Skeet Latch, MD on 01/18/2016 at 11:33 PM EDT Normal monitor. No arrhythmias.  Pt verbalized understanding. Advised to call with further questions/concerns.

## 2016-01-28 ENCOUNTER — Telehealth: Payer: Self-pay | Admitting: Neurology

## 2016-01-28 MED ORDER — HYDROCODONE-ACETAMINOPHEN 5-325 MG PO TABS: 1.0000 | ORAL_TABLET | Freq: Three times a day (TID) | ORAL | 0 refills | Status: DC | PRN

## 2016-01-28 NOTE — Telephone Encounter (Signed)
Hydrocodone rx. up front GNA/fim 

## 2016-01-28 NOTE — Telephone Encounter (Signed)
RAS sig/fim 

## 2016-01-28 NOTE — Telephone Encounter (Signed)
Pt request refill for HYDROcodone-acetaminophen (NORCO/VICODIN) 5-325 MG tablet °

## 2016-02-03 ENCOUNTER — Encounter (HOSPITAL_COMMUNITY): Payer: Self-pay

## 2016-02-03 NOTE — Patient Instructions (Addendum)
Your procedure is scheduled on:  Thursday, Oct. 5, 2017  Enter through the [employer] [facility] at:  6:00 AM  Pick up the phone at the desk and dial [phone].  Call this number if you have problems the morning of surgery: [phone].  Remember: Do NOT eat food or drink after:  Midnight Wednesday, Oct. 4, 2017  Take these medicines the morning of surgery with a SIP OF WATER:  Xanax if needed  Stop taking Vitamin C, Vitamin E, Aspirin, and Turmeric  Do NOT wear jewelry (body piercing), metal hair clips/bobby pins, make-up, or nail polish. Do NOT wear lotions, powders, or perfumes.  You may wear deodorant. Do NOT shave for 48 hours prior to surgery. Do NOT bring valuables to the hospital. Contacts, dentures, or bridgework may not be worn into surgery.  Leave suitcase in car.  After surgery it may be brought to your room.  For patients admitted to the hospital, checkout time is 11:00 AM the day of discharge.

## 2016-02-04 ENCOUNTER — Encounter (HOSPITAL_COMMUNITY)
Admission: RE | Admit: 2016-02-04 | Discharge: 2016-02-04 | Disposition: A | Discharge status: Home / Self Care | Payer: Medicare Other | Source: Ambulatory Visit | Attending: Obstetrics and Gynecology | Admitting: Obstetrics and Gynecology

## 2016-02-04 ENCOUNTER — Encounter (HOSPITAL_COMMUNITY): Payer: Self-pay

## 2016-02-04 DIAGNOSIS — Z01818 Encounter for other preprocedural examination: Secondary | ICD-10-CM | POA: Diagnosis not present

## 2016-02-04 HISTORY — DX: Palpitations: R00.2

## 2016-02-04 HISTORY — DX: Other complications of anesthesia, initial encounter: T88.59XA

## 2016-02-04 HISTORY — DX: Polyp of colon: K63.5

## 2016-02-04 HISTORY — DX: Adverse effect of unspecified anesthetic, initial encounter: T41.45XA

## 2016-02-04 HISTORY — DX: Other specified postprocedural states: Z98.890

## 2016-02-04 LAB — CBC
HCT: 40.8 % (ref 36.0–46.0)
Hemoglobin: 14 g/dL (ref 12.0–15.0)
MCH: 32.3 pg (ref 26.0–34.0)
MCHC: 34.3 g/dL (ref 30.0–36.0)
MCV: 94.2 fL (ref 78.0–100.0)
PLATELETS: 345 10*3/uL (ref 150–400)
RBC: 4.33 MIL/uL (ref 3.87–5.11)
RDW: 13.7 % (ref 11.5–15.5)
WBC: 5.8 10*3/uL (ref 4.0–10.5)

## 2016-02-04 LAB — TYPE AND SCREEN
ABO/RH(D): O POS
ANTIBODY SCREEN: NEGATIVE

## 2016-02-04 LAB — ABO/RH: ABO/RH(D): O POS

## 2016-02-09 NOTE — H&P (Signed)
55 year old female with symptomatic fibroids, pelvic pain and pressure.  For TAH AND BSO.  Past Medical History:  Diagnosis Date  . Avascular necrosis ([place])   . Colon polyp    pre-cancerous  . Complication of anesthesia    patient states she requires more anesthesia  . Disorder of soft tissue   . Diverticulosis   . H/O breast biopsy    pre- cancerous cells  . Hand fracture, left    9/87  . Heart palpitations   . History of Holter monitoring   . Hyperlipidemia   . Lhermitte's syndrome   . Migraines   . Multiple sclerosis ([place])    dx in 11/1996  . Multiple sclerosis ([place])   . Neuropathy ([place])   . Numbness and tingling in hands   . Obesity   . OSA (obstructive sleep apnea)    no longer have OSA since stopped taking a MS medication  . Otitis media   . Raynaud's phenomenon   . Vaginitis and vulvovaginitis   . Vitamin D deficiency    Past Surgical History:  Procedure Laterality Date  . BREAST BIOPSY     core/left breast  . CHOLECYSTECTOMY    . COLONOSCOPY     1997/2001  . DILATION AND CURETTAGE OF UTERUS    . ENDOMETRIAL ABLATION    . LAPAROSCOPIC ABDOMINAL EXPLORATION     ovaries and intestines bond together  . LEFT HEART CATHETERIZATION WITH CORONARY ANGIOGRAM N/A 06/21/2011   Procedure: LEFT HEART CATHETERIZATION WITH CORONARY ANGIOGRAM;  Surgeon: Leonie Man, MD;  [facility] CATH LAB;  Service: Cardiovascular;  Laterality: N/A;  . LUMBAR PUNCTURE     11/01/1996  . TOTAL HIP ARTHROPLASTY     left hip  6/02  /right hip12/04   LMP 04/09/2013   Family History  Problem Relation Age of Onset  . Pancreatic cancer Mother   . Stroke Mother   . Testicular cancer Brother   . Colon cancer Maternal Uncle   . Pancreatic cancer Paternal Uncle   . Prostate cancer Maternal Grandfather   . Colon polyps Father    Prior to Admission medications   Medication Sig Start Date End Date Taking? Authorizing Provider  mometasone (NASONEX) 50 MCG/ACT nasal spray Place 2 sprays  into the nose daily as needed (allergies).   Yes Historical Provider, MD  ALPRAZolam Duanne Moron) 0.5 MG tablet Take 1 tablet (0.5 mg total) by mouth daily as needed for anxiety. 07/07/15   Britt Bottom, MD  Ascorbic Acid (VITAMIN C) 1000 MG tablet Take 1,000 mg by mouth daily.    Historical Provider, MD  aspirin 81 MG tablet Take 81 mg by mouth daily.    Historical Provider, MD  baclofen (LIORESAL) 10 MG tablet Take 2 tablets (20 mg total) by mouth 3 (three) times daily. 10/14/15   Britt Bottom, MD  carisoprodol (SOMA) 350 MG tablet TAKE 1 TABLET BY MOUTH 3 TIMES A DAY. 09/05/15   Britt Bottom, MD  diphenhydrAMINE (BENADRYL) 25 MG tablet Take 25 mg by mouth 3 (three) times daily.    Historical Provider, MD  doxepin (SINEQUAN) 10 MG capsule One or two at bedtime Patient taking differently: Take 20 mg by mouth at bedtime. One or two at bedtime 12/04/15   Britt Bottom, MD  ergocalciferol (VITAMIN D2) 50000 UNITS capsule Take 50,000 Units by mouth 2 (two) times a week.     Historical Provider, MD  HYDROcodone-acetaminophen (NORCO/VICODIN) 5-325 MG tablet Take 1 tablet  by mouth 3 (three) times daily as needed for moderate pain. 01/28/16   Britt Bottom, MD  HYDROcodone-homatropine [facility]) 5-1.5 MG/5ML syrup Take 5 mLs by mouth every 6 (six) hours as needed for cough.    Historical Provider, MD  loratadine-pseudoephedrine (CLARITIN-D 24 HOUR) 10-240 MG 24 hr tablet Take 1 tablet by mouth daily. Patient taking differently: Take 1 tablet by mouth daily as needed for allergies.  03/03/15   Britt Bottom, MD  Melatonin 5 MG TABS Take 2 tablets by mouth at bedtime.    Historical Provider, MD  progesterone (PROMETRIUM) 200 MG capsule Take 200 mg by mouth daily.  06/11/15   Historical Provider, MD  rizatriptan (MAXALT-MLT) 10 MG disintegrating tablet DISSOLVE 1 TABLET ON TONGUE AS NEEDED. MAY REPEAT IN 2 HOURS IF NEEDED. 10/29/15   Britt Bottom, MD  tiZANidine (ZANAFLEX) 2 MG tablet Take 2 mg by mouth  every 6 (six) hours as needed for muscle spasms.    Historical Provider, MD  Turmeric 1053 MG TABS Take 1,053 mg by mouth daily.    Historical Provider, MD  vitamin E 400 UNIT capsule Take 400 Units by mouth daily.    Historical Provider, MD  zolpidem (AMBIEN) 10 MG tablet Take 10 mg by mouth at bedtime as needed. For sleep    Historical Provider, MD   Allergies: Lamictal [lamotrigine]; Ultram [tramadol hcl]; Codeine; and Latex   General alert and oriented Lung CTAB Car RRR Abdomen is soft and non tender Pelvic 12 week size fibroid uterus  IMPRESSION: Symptomatic Fibroids  PLAN: TAH and BSO Risks of anesthesia, injury to internal organs, bleeding and infection reviewed with patient Consent signed

## 2016-02-10 ENCOUNTER — Telehealth: Payer: Self-pay | Admitting: *Deleted

## 2016-02-10 MED ORDER — ALPRAZOLAM 0.5 MG PO TABS: 0.5000 mg | ORAL_TABLET | Freq: Every day | ORAL | 3 refills | Status: DC | PRN

## 2016-02-10 NOTE — Telephone Encounter (Signed)
Rx. awaiting RAS sig/fim 

## 2016-02-12 ENCOUNTER — Inpatient Hospital Stay (HOSPITAL_COMMUNITY)
Admission: RE | Admit: 2016-02-12 | Discharge: 2016-02-14 | DRG: 743 | Disposition: A | Discharge status: Home / Self Care | Payer: Medicare Other | Source: Ambulatory Visit | Attending: Obstetrics and Gynecology | Admitting: Obstetrics and Gynecology

## 2016-02-12 ENCOUNTER — Encounter (HOSPITAL_COMMUNITY)
Admission: RE | Disposition: A | Discharge status: Home / Self Care | Payer: Self-pay | Source: Ambulatory Visit | Attending: Obstetrics and Gynecology

## 2016-02-12 ENCOUNTER — Encounter (HOSPITAL_COMMUNITY): Payer: Self-pay

## 2016-02-12 ENCOUNTER — Inpatient Hospital Stay (HOSPITAL_COMMUNITY): Payer: Medicare Other | Admitting: Anesthesiology

## 2016-02-12 DIAGNOSIS — R102 Pelvic and perineal pain: Secondary | ICD-10-CM | POA: Diagnosis present

## 2016-02-12 DIAGNOSIS — Z87891 Personal history of nicotine dependence: Secondary | ICD-10-CM | POA: Diagnosis not present

## 2016-02-12 DIAGNOSIS — D251 Intramural leiomyoma of uterus: Secondary | ICD-10-CM | POA: Diagnosis present

## 2016-02-12 DIAGNOSIS — Z7982 Long term (current) use of aspirin: Secondary | ICD-10-CM | POA: Diagnosis not present

## 2016-02-12 DIAGNOSIS — D219 Benign neoplasm of connective and other soft tissue, unspecified: Secondary | ICD-10-CM | POA: Diagnosis present

## 2016-02-12 DIAGNOSIS — D259 Leiomyoma of uterus, unspecified: Secondary | ICD-10-CM | POA: Diagnosis present

## 2016-02-12 DIAGNOSIS — G4733 Obstructive sleep apnea (adult) (pediatric): Secondary | ICD-10-CM | POA: Diagnosis present

## 2016-02-12 DIAGNOSIS — G35 Multiple sclerosis: Secondary | ICD-10-CM | POA: Diagnosis present

## 2016-02-12 HISTORY — PX: SALPINGOOPHORECTOMY: SHX82

## 2016-02-12 HISTORY — PX: ABDOMINAL HYSTERECTOMY: SHX81

## 2016-02-12 SURGERY — HYSTERECTOMY, ABDOMINAL
Anesthesia: General | Site: Abdomen | Laterality: Bilateral

## 2016-02-12 MED ORDER — MIDAZOLAM HCL 2 MG/2ML IJ SOLN
INTRAMUSCULAR | Status: DC | PRN
  Administered 2016-02-12: 1 mg via INTRAVENOUS

## 2016-02-12 MED ORDER — LIDOCAINE HCL (CARDIAC) 20 MG/ML IV SOLN
INTRAVENOUS | Status: AC
  Filled 2016-02-12: qty 5

## 2016-02-12 MED ORDER — PROMETHAZINE HCL 25 MG/ML IJ SOLN: 6.2500 mg | INTRAMUSCULAR | Status: DC | PRN

## 2016-02-12 MED ORDER — HYDROMORPHONE HCL 1 MG/ML IJ SOLN
INTRAMUSCULAR | Status: AC
  Filled 2016-02-12: qty 1

## 2016-02-12 MED ORDER — HYDROMORPHONE HCL 1 MG/ML IJ SOLN
INTRAMUSCULAR | Status: DC | PRN
  Administered 2016-02-12: 1 mg via INTRAVENOUS

## 2016-02-12 MED ORDER — SUGAMMADEX SODIUM 200 MG/2ML IV SOLN
INTRAVENOUS | Status: AC
Start: 2016-02-12 — End: 2016-02-12
  Filled 2016-02-12: qty 2

## 2016-02-12 MED ORDER — ONDANSETRON HCL 4 MG/2ML IJ SOLN
INTRAMUSCULAR | Status: DC | PRN
  Administered 2016-02-12: 4 mg via INTRAVENOUS

## 2016-02-12 MED ORDER — NALOXONE HCL 0.4 MG/ML IJ SOLN: 0.4000 mg | INTRAMUSCULAR | Status: DC | PRN

## 2016-02-12 MED ORDER — BUPIVACAINE HCL (PF) 0.25 % IJ SOLN
INTRAMUSCULAR | Status: AC
  Filled 2016-02-12: qty 30

## 2016-02-12 MED ORDER — ROCURONIUM BROMIDE 100 MG/10ML IV SOLN
INTRAVENOUS | Status: AC
  Filled 2016-02-12: qty 1

## 2016-02-12 MED ORDER — DIPHENHYDRAMINE HCL 12.5 MG/5ML PO ELIX
12.5000 mg | ORAL_SOLUTION | Freq: Four times a day (QID) | ORAL | Status: DC | PRN
  Administered 2016-02-13: 25 mg via ORAL
  Filled 2016-02-12 (×2): qty 5

## 2016-02-12 MED ORDER — MEPERIDINE HCL 25 MG/ML IJ SOLN
INTRAMUSCULAR | Status: AC
  Administered 2016-02-12: 12.5 mg via INTRAVENOUS
  Filled 2016-02-12: qty 1

## 2016-02-12 MED ORDER — FENTANYL CITRATE (PF) 100 MCG/2ML IJ SOLN
INTRAMUSCULAR | Status: DC | PRN
  Administered 2016-02-12 (×5): 50 ug via INTRAVENOUS

## 2016-02-12 MED ORDER — MIDAZOLAM HCL 2 MG/2ML IJ SOLN
INTRAMUSCULAR | Status: AC
  Filled 2016-02-12: qty 2

## 2016-02-12 MED ORDER — KETOROLAC TROMETHAMINE 30 MG/ML IJ SOLN
INTRAMUSCULAR | Status: DC | PRN
  Administered 2016-02-12: 30 mg via INTRAVENOUS

## 2016-02-12 MED ORDER — IBUPROFEN 600 MG PO TABS
600.0000 mg | ORAL_TABLET | Freq: Four times a day (QID) | ORAL | Status: DC | PRN
  Administered 2016-02-12 – 2016-02-14 (×4): 600 mg via ORAL
  Filled 2016-02-12 (×4): qty 1

## 2016-02-12 MED ORDER — MEPERIDINE HCL 25 MG/ML IJ SOLN
6.2500 mg | INTRAMUSCULAR | Status: DC | PRN
  Administered 2016-02-12: 12.5 mg via INTRAVENOUS

## 2016-02-12 MED ORDER — SCOPOLAMINE 1 MG/3DAYS TD PT72
1.0000 | MEDICATED_PATCH | Freq: Once | TRANSDERMAL | Status: DC
  Administered 2016-02-12: 1.5 mg via TRANSDERMAL

## 2016-02-12 MED ORDER — LIDOCAINE HCL (CARDIAC) 20 MG/ML IV SOLN
INTRAVENOUS | Status: DC | PRN
  Administered 2016-02-12: 30 mg via INTRAVENOUS

## 2016-02-12 MED ORDER — FLUTICASONE PROPIONATE 50 MCG/ACT NA SUSP: 1.0000 | Freq: Every day | NASAL | Status: DC | PRN

## 2016-02-12 MED ORDER — FENTANYL CITRATE (PF) 100 MCG/2ML IJ SOLN
INTRAMUSCULAR | Status: AC
  Administered 2016-02-12: 50 ug via INTRAVENOUS
  Filled 2016-02-12: qty 2

## 2016-02-12 MED ORDER — KETOROLAC TROMETHAMINE 30 MG/ML IJ SOLN: 30.0000 mg | Freq: Once | INTRAMUSCULAR | Status: DC

## 2016-02-12 MED ORDER — 0.9 % SODIUM CHLORIDE (POUR BTL) OPTIME
TOPICAL | Status: DC | PRN
  Administered 2016-02-12: 2000 mL

## 2016-02-12 MED ORDER — PROPOFOL 10 MG/ML IV BOLUS
INTRAVENOUS | Status: AC
  Filled 2016-02-12: qty 20

## 2016-02-12 MED ORDER — DIPHENHYDRAMINE HCL 50 MG/ML IJ SOLN
12.5000 mg | Freq: Four times a day (QID) | INTRAMUSCULAR | Status: DC | PRN
  Administered 2016-02-12: 12.5 mg via INTRAVENOUS
  Filled 2016-02-12: qty 1

## 2016-02-12 MED ORDER — DEXAMETHASONE SODIUM PHOSPHATE 10 MG/ML IJ SOLN
INTRAMUSCULAR | Status: DC | PRN
  Administered 2016-02-12: 4 mg via INTRAVENOUS

## 2016-02-12 MED ORDER — LACTATED RINGERS IV SOLN
INTRAVENOUS | Status: DC
  Administered 2016-02-12: 125 mL/h via INTRAVENOUS

## 2016-02-12 MED ORDER — PROPOFOL 10 MG/ML IV BOLUS
INTRAVENOUS | Status: DC | PRN
  Administered 2016-02-12: 180 mg via INTRAVENOUS
  Administered 2016-02-12: 20 mg via INTRAVENOUS

## 2016-02-12 MED ORDER — KETOROLAC TROMETHAMINE 30 MG/ML IJ SOLN
INTRAMUSCULAR | Status: AC
  Filled 2016-02-12: qty 1

## 2016-02-12 MED ORDER — FENTANYL CITRATE (PF) 250 MCG/5ML IJ SOLN
INTRAMUSCULAR | Status: AC
  Filled 2016-02-12: qty 5

## 2016-02-12 MED ORDER — MENTHOL 3 MG MT LOZG: 1.0000 | LOZENGE | OROMUCOSAL | Status: DC | PRN

## 2016-02-12 MED ORDER — LACTATED RINGERS IV SOLN
INTRAVENOUS | Status: DC
  Administered 2016-02-12: 16:00:00 via INTRAVENOUS

## 2016-02-12 MED ORDER — ROCURONIUM BROMIDE 100 MG/10ML IV SOLN
INTRAVENOUS | Status: DC | PRN
  Administered 2016-02-12: 40 mg via INTRAVENOUS
  Administered 2016-02-12 (×2): 10 mg via INTRAVENOUS

## 2016-02-12 MED ORDER — SODIUM CHLORIDE 0.9% FLUSH: 9.0000 mL | INTRAVENOUS | Status: DC | PRN

## 2016-02-12 MED ORDER — ONDANSETRON HCL 4 MG/2ML IJ SOLN
INTRAMUSCULAR | Status: AC
  Filled 2016-02-12: qty 2

## 2016-02-12 MED ORDER — CEFAZOLIN SODIUM-DEXTROSE 2-4 GM/100ML-% IV SOLN
2.0000 g | INTRAVENOUS | Status: AC
  Administered 2016-02-12: 2 g via INTRAVENOUS

## 2016-02-12 MED ORDER — SUGAMMADEX SODIUM 200 MG/2ML IV SOLN
INTRAVENOUS | Status: DC | PRN
  Administered 2016-02-12: 196 mg via INTRAVENOUS

## 2016-02-12 MED ORDER — TRAMADOL HCL 50 MG PO TABS: 50.0000 mg | ORAL_TABLET | Freq: Four times a day (QID) | ORAL | Status: DC | PRN

## 2016-02-12 MED ORDER — SCOPOLAMINE 1 MG/3DAYS TD PT72
MEDICATED_PATCH | TRANSDERMAL | Status: AC
  Filled 2016-02-12: qty 1

## 2016-02-12 MED ORDER — HYDROMORPHONE 1 MG/ML IV SOLN
INTRAVENOUS | Status: DC
  Administered 2016-02-12: 3.8 mg via INTRAVENOUS
  Administered 2016-02-12: 3.2 mg via INTRAVENOUS
  Administered 2016-02-12: 4.4 mg via INTRAVENOUS
  Administered 2016-02-12: 11:00:00 via INTRAVENOUS
  Administered 2016-02-13: 0.4 mg via INTRAVENOUS
  Administered 2016-02-13: 1 mg via INTRAVENOUS
  Filled 2016-02-12: qty 25

## 2016-02-12 MED ORDER — LACTATED RINGERS IV SOLN
INTRAVENOUS | Status: DC | PRN
  Administered 2016-02-12 (×2): via INTRAVENOUS

## 2016-02-12 MED ORDER — ONDANSETRON HCL 4 MG/2ML IJ SOLN: 4.0000 mg | Freq: Four times a day (QID) | INTRAMUSCULAR | Status: DC | PRN

## 2016-02-12 MED ORDER — DEXAMETHASONE SODIUM PHOSPHATE 4 MG/ML IJ SOLN
INTRAMUSCULAR | Status: AC
  Filled 2016-02-12: qty 1

## 2016-02-12 MED ORDER — ALPRAZOLAM 0.5 MG PO TABS
0.5000 mg | ORAL_TABLET | Freq: Every day | ORAL | Status: DC | PRN
  Administered 2016-02-12: 0.5 mg via ORAL
  Filled 2016-02-12: qty 1

## 2016-02-12 MED ORDER — FENTANYL CITRATE (PF) 100 MCG/2ML IJ SOLN
25.0000 ug | INTRAMUSCULAR | Status: DC | PRN
  Administered 2016-02-12 (×2): 50 ug via INTRAVENOUS

## 2016-02-12 SURGICAL SUPPLY — 39 items
CANISTER SUCT 3000ML (MISCELLANEOUS) ×2 IMPLANT
CLOTH BEACON ORANGE TIMEOUT ST (SAFETY) ×2 IMPLANT
DECANTER SPIKE VIAL GLASS SM (MISCELLANEOUS) IMPLANT
DRAPE CESAREAN BIRTH W POUCH (DRAPES) ×2 IMPLANT
DRAPE WARM FLUID 44X44 (DRAPE) ×2 IMPLANT
DRSG OPSITE POSTOP 4X10 (GAUZE/BANDAGES/DRESSINGS) ×2 IMPLANT
DURAPREP 26ML APPLICATOR (WOUND CARE) ×2 IMPLANT
ELECT BLADE 6.5 EXT (BLADE) ×2 IMPLANT
GAUZE SPONGE 4X4 16PLY XRAY LF (GAUZE/BANDAGES/DRESSINGS) ×2 IMPLANT
GLOVE BIO SURGEON STRL SZ 6.5 (GLOVE) ×2 IMPLANT
GLOVE BIOGEL PI IND STRL 7.0 (GLOVE) ×4 IMPLANT
GLOVE BIOGEL PI INDICATOR 7.0 (GLOVE) ×4
GOWN STRL REUS W/TWL LRG LVL3 (GOWN DISPOSABLE) ×8 IMPLANT
LIQUID BAND (GAUZE/BANDAGES/DRESSINGS) ×2 IMPLANT
NEEDLE HYPO 22GX1.5 SAFETY (NEEDLE) IMPLANT
NS IRRIG 1000ML POUR BTL (IV SOLUTION) ×4 IMPLANT
PACK ABDOMINAL GYN (CUSTOM PROCEDURE TRAY) ×2 IMPLANT
PAD OB MATERNITY 4.3X12.25 (PERSONAL CARE ITEMS) ×2 IMPLANT
PENCIL SMOKE EVAC W/HOLSTER (ELECTROSURGICAL) ×2 IMPLANT
PROTECTOR NERVE ULNAR (MISCELLANEOUS) ×2 IMPLANT
SEPRAFILM MEMBRANE 5X6 (MISCELLANEOUS) IMPLANT
SPONGE LAP 18X18 X RAY DECT (DISPOSABLE) ×2 IMPLANT
STAPLER VISISTAT 35W (STAPLE) IMPLANT
SUT PDS AB 0 CT 36 (SUTURE) IMPLANT
SUT PDS AB 0 CTX 60 (SUTURE) IMPLANT
SUT PLAIN 2 0 XLH (SUTURE) ×2 IMPLANT
SUT VIC AB 0 CT1 18XCR BRD8 (SUTURE) ×3 IMPLANT
SUT VIC AB 0 CT1 27 (SUTURE) ×8
SUT VIC AB 0 CT1 27XBRD ANBCTR (SUTURE) ×4 IMPLANT
SUT VIC AB 0 CT1 8-18 (SUTURE) ×3
SUT VIC AB 3-0 PS1 18 (SUTURE)
SUT VIC AB 3-0 PS1 18X BRD (SUTURE) IMPLANT
SUT VIC AB 4-0 KS 27 (SUTURE) ×2 IMPLANT
SUT VICRYL 0 TIES 12 18 (SUTURE) ×2 IMPLANT
SYR CONTROL 10ML LL (SYRINGE) IMPLANT
SYRINGE 10CC LL (SYRINGE) IMPLANT
TOWEL OR 17X24 6PK STRL BLUE (TOWEL DISPOSABLE) ×4 IMPLANT
TRAY FOLEY CATH SILVER 14FR (SET/KITS/TRAYS/PACK) ×2 IMPLANT
WATER STERILE IRR 1000ML POUR (IV SOLUTION) IMPLANT

## 2016-02-12 NOTE — Anesthesia Procedure Notes (Signed)
Procedure Name: Intubation Date/Time: 02/12/2016 7:33 AM Performed by: Tobin Chad Pre-anesthesia Checklist: Patient identified, Patient being monitored, Timeout performed, Emergency Drugs available and Suction available Patient Re-evaluated:Patient Re-evaluated prior to inductionOxygen Delivery Method: Circle system utilized and Simple face mask Preoxygenation: Pre-oxygenation with 100% oxygen Intubation Type: IV induction Ventilation: Mask ventilation without difficulty Laryngoscope Size: Mac and 3 Grade View: Grade II Tube type: Oral Tube size: 7.0 mm Number of attempts: 1 Airway Equipment and Method: Stylet Placement Confirmation: ETT inserted through vocal cords under direct vision,  positive ETCO2 and breath sounds checked- equal and bilateral Secured at: 22 cm Tube secured with: Tape Dental Injury: Teeth and Oropharynx as per pre-operative assessment

## 2016-02-12 NOTE — Op Note (Signed)
NAME:  Grace [name], Grace [name] NO.:  [id number]  MEDICAL RECORD NO.:  [id number]  LOCATION:  [facility]                          FACILITY:  [facility]  PHYSICIAN:  Madeeha Costantino L. Gunner Iodice, M.D.DATE OF BIRTH:  [birth year]  DATE OF PROCEDURE:  02/12/2016 DATE OF DISCHARGE:                              OPERATIVE REPORT   PREOPERATIVE DIAGNOSIS:  Symptomatic fibroids.  POSTOPERATIVE DIAGNOSIS:  Symptomatic fibroids.  PROCEDURES:  Total abdominal hysterectomy and bilateral salpingo- oophorectomy.  SURGEON:  Delinda Malan L. Helane Rima, MD.  ANESTHESIA:  General.  EBL:  350 mL.  COMPLICATIONS:  None.  PATHOLOGY:  Uterus, cervix, tubes, and ovaries.  DESCRIPTION OF PROCEDURE:  The patient was taken to the operating room. She was intubated.  She was prepped and draped.  A Foley catheter was inserted.  A low-transverse incision was made, carried down to the fascia, fascia scored in midline, extended laterally.  The peritoneum was entered bluntly.  The peritoneal incision was then stretched.  Self- retaining retractor was placed in the abdominal cavity.  She had an enlarged myomatous uterus and a benign appearing cyst on her right ovary.  The uterus was very broad.  It was a little difficult to see the sidewalls and anteriorly as well at first.  We elevated the uterus using Kelly clamps and by placing them on the triple pedicle, we suture ligated the round ligament on the right and then on the left.  Each pedicle was suture ligated using 0 Vicryl suture.  We then developed the bladder flap carefully with sharp and blunt dissection.  An avascular window was developed just beneath the IP ligament on the left side and a curved Heaney clamp was placed across the IP ligament staying very snug against the ovary with careful attention to avoid the ureter well below our clamp.  The pedicle was secured with a suture ligature and 0 Vicryl free tie of 0 Vicryl suture.  We placed a curved Haney clamp across  the triple pedicle on the right.  Visualization of the right ovary was a little limited because of the size of the uterus.  So, we went ahead and clamped that first and the pedicle was secured using 0 Vicryl suture. We elevated the uterus further, developed the bladder flap, and placed a clamp across the uterine artery at the level of internal os.  Each pedicle was clamped, cut, and suture ligated using 0 Vicryl suture.  We then developed the bladder flap further.  At this point, we decided to amputate the top of the uterus so that we could see the cervix really well, which we did.  We then elevated the cervix with Kocher clamps, developed the bladder flap further with sharp and blunt dissection, then used straight and curved clamps to clamp snug just beside the cervix. Each pedicle was clamped, cut, and suture ligated using 0 Vicryl suture. We then entered the vagina at the level of the external os.  The cervix was removed using Mayo scissors.  The angled stitches were placed using 0 Vicryl suture and the cuff was closed with 0 Vicryl figure-of-eight. We then turned our attention back to the right tube and ovary.  Now, we  could see it clearly.  We elevated using a Babcock clamp, placed a curved Heaney clamp just beneath it with careful attention to avoid the ureter.  The specimen was removed using Mayo scissors and it was secured using 0 Vicryl suture ligature and a free tie of 0 Vicryl suture. Irrigation was performed.  Hemostasis was very good.  All instruments and lap pads were removed from the abdominal cavity and counts were correct.  The peritoneum was closed using 0 Vicryl.  The fascia was closed using 0 Vicryl starting each corner and meeting in the midline, and the skin was closed with a 4-0 Vicryl on a Keith needle.  All sponge, lap, and instrument counts were correct x2.  The patient was extubated and went to recovery room in stable condition.     Freemon Binford L. Helane Rima,  M.D.     Nevin Bloodgood  D:  02/12/2016  T:  02/12/2016  Job:  [id number]

## 2016-02-12 NOTE — Progress Notes (Signed)
Patient doing well.  BP (!) 121/57 (BP Location: Right Arm)   Pulse (!) 56   Temp 98.3 F (36.8 C) (Oral)   Resp 18   LMP 04/09/2013   SpO2 97%  Abdomen is soft and non tender  Urine output good Surgery discussed with patient Routine care Remove foley tomorrow am

## 2016-02-12 NOTE — Brief Op Note (Signed)
02/12/2016  8:58 AM  PATIENT:  Grace [name]  55 y.o. female  PRE-OPERATIVE DIAGNOSIS:  Symptomatic Fibroids  POST-OPERATIVE DIAGNOSIS:  Same  PROCEDURE:  Procedure(s): HYSTERECTOMY ABDOMINAL WITH BILATERAL SALPINGO OOPHERECTOMY (Bilateral) BILATERAL SALPINGO OOPHORECTOMY (Bilateral)  SURGEON:  Surgeon(s) and Role:    * Dian Queen, MD - Primary    * Arvella Nigh, MD - Assisting  PHYSICIAN ASSISTANT:   ASSISTANTS: none   ANESTHESIA:   general  EBL:  Total I/O In: 1000 [I.V.:1000] Out: 390 [Urine:40; Blood:350]  BLOOD ADMINISTERED:none  DRAINS: Urinary Catheter (Foley)   LOCAL MEDICATIONS USED:  NONE  SPECIMEN:  Source of Specimen:  uterus, cervix, tubes and ovaries  DISPOSITION OF SPECIMEN:  PATHOLOGY  COUNTS:  YES  TOURNIQUET:  * No tourniquets in log *  DICTATION: .Other Dictation: Dictation Number [id number]  PLAN OF CARE: Admit to inpatient   PATIENT DISPOSITION:  PACU - hemodynamically stable.   Delay start of Pharmacological VTE agent (>24hrs) due to surgical blood loss or risk of bleeding: yes

## 2016-02-12 NOTE — Transfer of Care (Signed)
Immediate Anesthesia Transfer of Care Note  Patient: Grace [name]  Procedure(s) Performed: Procedure(s): HYSTERECTOMY ABDOMINAL WITH BILATERAL SALPINGO OOPHERECTOMY (Bilateral) BILATERAL SALPINGO OOPHORECTOMY (Bilateral)  Patient Location: PACU  Anesthesia Type:General  Level of Consciousness: awake, alert  and oriented  Airway & Oxygen Therapy: Patient Spontanous Breathing and Patient connected to nasal cannula oxygen  Post-op Assessment: Report given to RN and Post -op Vital signs reviewed and stable  Post vital signs: Reviewed and stable  Last Vitals:  Vitals:   02/12/16 0625  BP: 116/65  Pulse: 77  Resp: 20  Temp: 36.3 C    Last Pain:  Vitals:   02/12/16 0625  TempSrc: Oral  PainSc: 2       Patients Stated Pain Goal: 5 ([id number])  Complications: No apparent anesthesia complications

## 2016-02-12 NOTE — Addendum Note (Signed)
Addendum  created 02/12/16 1318 by Flossie Dibble, CRNA   Sign clinical note

## 2016-02-12 NOTE — Anesthesia Preprocedure Evaluation (Addendum)
Anesthesia Evaluation  Patient identified by MRN, date of birth, ID band Patient awake    Reviewed: Allergy & Precautions, NPO status , Patient's Chart, lab work & pertinent test results  Airway Mallampati: II       Dental no notable dental hx.    Pulmonary former smoker,    Pulmonary exam normal        Cardiovascular negative cardio ROS Normal cardiovascular exam     Neuro/Psych    GI/Hepatic negative GI ROS, Neg liver ROS,   Endo/Other  negative endocrine ROS  Renal/GU negative Renal ROS  negative genitourinary   Musculoskeletal negative musculoskeletal ROS (+)   Abdominal (+) + obese,   Peds negative pediatric ROS (+)  Hematology negative hematology ROS (+)   Anesthesia Other Findings   Reproductive/Obstetrics negative OB ROS                            Anesthesia Physical Anesthesia Plan  ASA: II  Anesthesia Plan: General   Post-op Pain Management:    Induction: Intravenous  Airway Management Planned: Oral ETT  Additional Equipment:   Intra-op Plan:   Post-operative Plan: Extubation in OR  Informed Consent: I have reviewed the patients History and Physical, chart, labs and discussed the procedure including the risks, benefits and alternatives for the proposed anesthesia with the patient or authorized representative who has indicated his/her understanding and acceptance.   Dental advisory given  Plan Discussed with: CRNA and Surgeon  Anesthesia Plan Comments:         Anesthesia Quick Evaluation

## 2016-02-12 NOTE — Anesthesia Postprocedure Evaluation (Signed)
Anesthesia Post Note  Patient: Grace [name]  Procedure(s) Performed: Procedure(s) (LRB): HYSTERECTOMY ABDOMINAL WITH BILATERAL SALPINGO OOPHERECTOMY (Bilateral) BILATERAL SALPINGO OOPHORECTOMY (Bilateral)  Patient location during evaluation: PACU Anesthesia Type: General Level of consciousness: awake and alert Pain management: pain level controlled Vital Signs Assessment: post-procedure vital signs reviewed and stable Respiratory status: spontaneous breathing, nonlabored ventilation and respiratory function stable Cardiovascular status: blood pressure returned to baseline and stable Postop Assessment: no signs of nausea or vomiting Anesthetic complications: no     Last Vitals:  Vitals:   02/12/16 1015 02/12/16 1030  BP: (!) 125/55 (!) 116/55  Pulse: (!) 57 81  Resp: (!) 23 11  Temp:  36.6 C    Last Pain:  Vitals:   02/12/16 1030  TempSrc: Oral  PainSc:    Pain Goal: Patients Stated Pain Goal: 5 (02/12/16 0625)               Nilda Simmer

## 2016-02-12 NOTE — Progress Notes (Signed)
Patient without complaints H and P on the chart For TAH and BSO Consent signed

## 2016-02-12 NOTE — Anesthesia Postprocedure Evaluation (Signed)
Anesthesia Post Note  Patient: Saudia Yacoub Boutwell  Procedure(s) Performed: Procedure(s) (LRB): HYSTERECTOMY ABDOMINAL WITH BILATERAL SALPINGO OOPHERECTOMY (Bilateral) BILATERAL SALPINGO OOPHORECTOMY (Bilateral)  Patient location during evaluation: [facility] Anesthesia Type: General Level of consciousness: awake and alert Pain management: pain level not controlled (PCA by surgeon, coming to evaluate patient.) Vital Signs Assessment: post-procedure vital signs reviewed and stable Respiratory status: spontaneous breathing and respiratory function stable Cardiovascular status: stable Postop Assessment: adequate PO intake Anesthetic complications: no     Last Vitals:  Vitals:   02/12/16 1205 02/12/16 1305  BP: (!) 105/51 (!) 121/57  Pulse: (!) 58 (!) 56  Resp: 15 18  Temp: 36.8 C 36.8 C    Last Pain:  Vitals:   02/12/16 1305  TempSrc: Oral  PainSc:    Pain Goal: Patients Stated Pain Goal: 5 (02/12/16 [id number])               Katherina Mires

## 2016-02-13 ENCOUNTER — Encounter (HOSPITAL_COMMUNITY): Payer: Self-pay

## 2016-02-13 LAB — BASIC METABOLIC PANEL
ANION GAP: 6 (ref 5–15)
BUN: 9 mg/dL (ref 6–20)
CHLORIDE: 107 mmol/L (ref 101–111)
CO2: 26 mmol/L (ref 22–32)
Calcium: 8.3 mg/dL — ABNORMAL LOW (ref 8.9–10.3)
Creatinine, Ser: 0.66 mg/dL (ref 0.44–1.00)
GFR calc Af Amer: >60 mL/min (ref >60)
GFR calc non Af Amer: >60 mL/min (ref >60)
GLUCOSE: 120 mg/dL — AB (ref 65–99)
POTASSIUM: 3.7 mmol/L (ref 3.5–5.1)
SODIUM: 139 mmol/L (ref 135–145)

## 2016-02-13 LAB — CBC
HEMATOCRIT: 32 % — AB (ref 36.0–46.0)
HEMOGLOBIN: 10.8 g/dL — AB (ref 12.0–15.0)
MCH: 32 pg (ref 26.0–34.0)
MCHC: 33.8 g/dL (ref 30.0–36.0)
MCV: 95 fL (ref 78.0–100.0)
Platelets: 293 10*3/uL (ref 150–400)
RBC: 3.37 MIL/uL — ABNORMAL LOW (ref 3.87–5.11)
RDW: 13.8 % (ref 11.5–15.5)
WBC: 9.1 10*3/uL (ref 4.0–10.5)

## 2016-02-13 MED ORDER — DIPHENHYDRAMINE HCL 25 MG PO CAPS
25.0000 mg | ORAL_CAPSULE | Freq: Four times a day (QID) | ORAL | Status: DC | PRN
  Administered 2016-02-13 – 2016-02-14 (×3): 25 mg via ORAL
  Filled 2016-02-13 (×3): qty 1

## 2016-02-13 MED ORDER — HYDROMORPHONE HCL 2 MG PO TABS
2.0000 mg | ORAL_TABLET | ORAL | Status: DC | PRN
  Administered 2016-02-13 – 2016-02-14 (×5): 2 mg via ORAL
  Filled 2016-02-13 (×5): qty 1

## 2016-02-13 NOTE — Progress Notes (Signed)
Patient is doing well. Eating breakfast.  BP (!) 108/50 (BP Location: Right Arm) Comment: nurse notified (Zelvia)  Pulse 70   Temp 98.2 F (36.8 C) (Oral)   Resp 18   Ht 5\' 6"  (1.676 m)   Wt 97.5 kg (215 lb)   LMP 04/09/2013   SpO2 98%   BMI 34.70 kg/m  Results for orders placed or performed during the hospital encounter of 02/12/16 (from the past 24 hour(s))  Basic metabolic panel     Status: Abnormal   Collection Time: 02/13/16  4:59 AM  Result Value Ref Range   Sodium 139 135 - 145 mmol/L   Potassium 3.7 3.5 - 5.1 mmol/L   Chloride 107 101 - 111 mmol/L   CO2 26 22 - 32 mmol/L   Glucose, Bld 120 (H) 65 - 99 mg/dL   BUN 9 6 - 20 mg/dL   Creatinine, Ser 0.66 0.44 - 1.00 mg/dL   Calcium 8.3 (L) 8.9 - 10.3 mg/dL   GFR calc non Af Amer >60 >60 mL/min   GFR calc Af Amer >60 >60 mL/min   Anion gap 6 5 - 15  CBC     Status: Abnormal   Collection Time: 02/13/16  4:59 AM  Result Value Ref Range   WBC 9.1 4.0 - 10.5 K/uL   RBC 3.37 (L) 3.87 - 5.11 MIL/uL   Hemoglobin 10.8 (L) 12.0 - 15.0 g/dL   HCT 32.0 (L) 36.0 - 46.0 %   MCV 95.0 78.0 - 100.0 fL   MCH 32.0 26.0 - 34.0 pg   MCHC 33.8 30.0 - 36.0 g/dL   RDW 13.8 11.5 - 15.5 %   Platelets 293 150 - 400 K/uL   Abdomen is soft and non tender Bandage clean and dry  IMPRESSION: POD # 1 Doing well Routine care Change to po dilaudid Remove Foley Possible discharge this pm

## 2016-02-14 MED ORDER — HYDROMORPHONE HCL 2 MG PO TABS: 2.0000 mg | ORAL_TABLET | ORAL | 0 refills | Status: DC | PRN

## 2016-02-14 NOTE — Progress Notes (Signed)

## 2016-02-14 NOTE — Plan of Care (Signed)
Problem: Physical Regulation: Goal: Postoperative complications will be avoided or minimized Outcome: Completed/Met Date Met: 02/14/16 Patient is ambulatory,passing flatus,wears her SCD hose when in bed and vaginal bleeding is WNL.  Problem: Respiratory: Goal: Ability to maintain adequate ventilation will improve Outcome: Completed/Met Date Met: 02/14/16 No SOB or DOE with activity.  Problem: Urinary Elimination: Goal: Ability to reestablish a normal urinary elimination pattern will improve Voids qs yellow urine without difficulty.  Problem: Education: Goal: Knowledge of [facility] Education information/materials will improve Outcome: Completed/Met Date Met: 02/14/16 Patient was oriented to room and routine.  Problem: Safety: Goal: Ability to remain free from injury will improve Outcome: Completed/Met Date Met: 02/14/16 Ambulates in room and hall without any difficulty.  Problem: Health Behavior/Discharge Planning: Goal: Ability to manage health-related needs will improve Outcome: Completed/Met Date Met: 02/14/16 Able to teach back what to call her MD for.  Problem: Tissue Perfusion: Goal: Risk factors for ineffective tissue perfusion will decrease Patient wears her SCD hose when not in bed.  Problem: Activity: Goal: Risk for activity intolerance will decrease Outcome: Completed/Met Date Met: 02/14/16 Tolerates walking in room and hall without any difficulty.  Problem: Fluid Volume: Goal: Ability to maintain a balanced intake and output will improve Outcome: Completed/Met Date Met: 02/14/16 Tolerating diet and voids without difficulty.  Problem: Nutrition: Goal: Adequate nutrition will be maintained Outcome: Completed/Met Date Met: 02/14/16 Tolerates her diet well.  Problem: Bowel/Gastric: Goal: Will not experience complications related to bowel motility Outcome: Completed/Met Date Met: 02/14/16 Bowel sounds heard in all 4 quads.Has passed flatus.

## 2016-02-14 NOTE — Discharge Summary (Signed)
Admission Diagnosis: Symptomatic Fibroids  Discharge Diagnosis: Same  Hospital Course: 55 year old female with symptomatic fibroids. Underwent TAH and BSO.  She did very well post op. This am, pain is minimal . Eating breakfast. Ambulating. Voiding and positive flatus.  BP (!) 97/58 (BP Location: Right Arm)   Pulse 67   Temp 98.4 F (36.9 C) (Oral)   Resp 17   Ht 5\' 6"  (1.676 m)   Wt 97.5 kg (215 lb)   LMP 04/09/2013   SpO2 98%   BMI 34.70 kg/m  No results found for this or any previous visit (from the past 24 hour(s)). Abdomen Is soft and non tender  Bandage clean and dry  Patient discharged home in good condition. Rx Dilaudid given to patient Discharge precautions reviewed with patient Return to office in 1 week

## 2016-02-16 ENCOUNTER — Encounter (HOSPITAL_COMMUNITY): Payer: Self-pay | Admitting: Obstetrics and Gynecology

## 2016-02-16 NOTE — Addendum Note (Signed)
Addendum  created 02/16/16 1333 by Laverle Hobby, CRNA   Charge Capture section accepted

## 2016-02-20 ENCOUNTER — Ambulatory Visit (INDEPENDENT_AMBULATORY_CARE_PROVIDER_SITE_OTHER): Payer: Medicare Other | Admitting: Cardiovascular Disease

## 2016-02-20 ENCOUNTER — Encounter: Payer: Self-pay | Admitting: Cardiovascular Disease

## 2016-02-20 VITALS — BP 129/77 | HR 96 | Ht 66.0 in | Wt 214.6 lb

## 2016-02-20 DIAGNOSIS — R002 Palpitations: Secondary | ICD-10-CM

## 2016-02-20 NOTE — Patient Instructions (Signed)
Medication Instructions:  Your physician recommends that you continue on your current medications as directed. Please refer to the Current Medication list given to you today.  Labwork: none  Testing/Procedures: none  Follow-Up: As needed   If you need a refill on your cardiac medications before your next appointment, please call your pharmacy.  

## 2016-02-20 NOTE — Progress Notes (Signed)
Cardiology Office Note   Date:  02/20/2016   ID:  Grace [name], DOB [birth year], MRN [id number]  PCP:  [name], MD  Cardiologist:   Skeet Latch, MD   Chief Complaint  Patient presents with  . Follow-up    3 months; sob; randomly.       History of Present Illness: Grace [name] is a 55 y.o. female with MS, transverse myelitis, and chest pain who presents for follow up.  Grace [name] saw Dr. [name] on 10/15/15.  At that time she had an EKG that reported out as atrial flutter but actually revealed sinus tachycardia at a rate of 111 bpm.  She reports having a viral illness at the time.  She was referred to cardiology for further evaluation. At that appointment she reported chest pressure that felt like an elephant sitting on her chest.  She was referred for [facility] 12/05/15 that revealed LVEF 63% and no perfusion defects.  She reported palpitations and had a 7 day event monitor 12/16/15 that did not reveal any arrhythmias. She notes that she did feel palpitations while wearing the monitor. In retrospect, she thinks that her palpitations may be due to stress. She is the primary caregiver for her husband who has physical disabilities. He is currently in a nursing home so that she could undergo TAH/BSO last week. She thinks that he will be coming back in one week. She notes that she continues to have pain from her surgery, though it is improving. She denies any chest pain. She does have shortness of breath when she takes breaths. There is no lower extremity edema, orthopnea, or PND.  Of note, in 2013 Grace [name] had a Myoview that was negative for ischemia.  However, she continued to have chest pain and subsequently underwent LHC 06/2011 that revealed no CAD.   Past Medical History:  Diagnosis Date  . Avascular necrosis ([place])   . Colon polyp    pre-cancerous  . Complication of anesthesia    patient states she requires more anesthesia  . Disorder of soft tissue   .  Diverticulosis   . H/O breast biopsy    pre- cancerous cells  . Hand fracture, left    9/87  . Heart palpitations   . History of Holter monitoring   . Hyperlipidemia   . Lhermitte's syndrome   . Migraines   . Multiple sclerosis ([place])    dx in 11/1996  . Multiple sclerosis ([place])   . Neuropathy ([place])   . Numbness and tingling in hands   . Obesity   . OSA (obstructive sleep apnea)    no longer have OSA since stopped taking a MS medication  . Otitis media   . Raynaud's phenomenon   . Vaginitis and vulvovaginitis   . Vitamin D deficiency     Past Surgical History:  Procedure Laterality Date  . ABDOMINAL HYSTERECTOMY Bilateral 02/12/2016   Procedure: HYSTERECTOMY ABDOMINAL WITH BILATERAL SALPINGO OOPHERECTOMY;  Surgeon: Dian Queen, MD;  Location: [facility];  Service: Gynecology;  Laterality: Bilateral;  . BREAST BIOPSY     core/left breast  . CHOLECYSTECTOMY    . COLONOSCOPY     1997/2001  . DILATION AND CURETTAGE OF UTERUS    . ENDOMETRIAL ABLATION    . LAPAROSCOPIC ABDOMINAL EXPLORATION     ovaries and intestines bond together  . LEFT HEART CATHETERIZATION WITH CORONARY ANGIOGRAM N/A 06/21/2011   Procedure: LEFT HEART CATHETERIZATION WITH CORONARY ANGIOGRAM;  Surgeon: Leonie Green  Ellyn Hack, MD;  Location: [facility] C[facility] LAB;  Service: Cardiovascular;  Laterality: N/A;  . LUMBAR PUNCTURE     11/01/1996  . SALPINGOOPHORECTOMY Bilateral 02/12/2016   Procedure: BILATERAL SALPINGO OOPHORECTOMY;  Surgeon: Dian Queen, MD;  Location: [facility];  Service: Gynecology;  Laterality: Bilateral;  . TOTAL HIP ARTHROPLASTY     left hip  6/02  /right hip12/04     Current Outpatient Prescriptions  Medication Sig Dispense Refill  . ALPRAZolam (XANAX) 0.5 MG tablet Take 1 tablet (0.5 mg total) by mouth daily as needed for anxiety. 30 tablet 3  . Ascorbic Acid (VITAMIN C) 1000 MG tablet Take 1,000 mg by mouth daily.    . baclofen (LIORESAL) 10 MG tablet Take 2 tablets (20 mg total) by mouth 3 (three)  times daily. 180 each 11  . carisoprodol (SOMA) 350 MG tablet TAKE 1 TABLET BY MOUTH 3 TIMES A DAY. 90 tablet 5  . diphenhydrAMINE (BENADRYL) 25 MG tablet Take 25 mg by mouth 3 (three) times daily.    [name] Kitchen doxepin (SINEQUAN) 10 MG capsule One or two at bedtime (Patient taking differently: Take 20 mg by mouth at bedtime. One or two at bedtime) 60 capsule 11  . ergocalciferol (VITAMIN D2) 50000 UNITS capsule Take 50,000 Units by mouth 2 (two) times a week.     [name] Kitchen HYDROcodone-acetaminophen (NORCO/VICODIN) 5-325 MG tablet     . loratadine-pseudoephedrine (CLARITIN-D 24 HOUR) 10-240 MG 24 hr tablet Take 1 tablet by mouth daily. (Patient taking differently: Take 1 tablet by mouth daily as needed for allergies. ) 30 tablet 5  . Melatonin 5 MG TABS Take 2 tablets by mouth at bedtime.    . mometasone (NASONEX) 50 MCG/ACT nasal spray Place 2 sprays into the nose daily as needed (allergies).    . Rizatriptan Benzoate (MAXALT PO) Take by mouth.    [name] Kitchen tiZANidine (ZANAFLEX) 2 MG tablet Take 2 mg by mouth every 6 (six) hours as needed for muscle spasms.    . Turmeric 1053 MG TABS Take 1,053 mg by mouth daily.    . vitamin E 400 UNIT capsule Take 400 Units by mouth daily.    [name] Kitchen zolpidem (AMBIEN) 10 MG tablet Take 10 mg by mouth at bedtime as needed. For sleep     No current facility-administered medications for this visit.     Allergies:   Lamictal [lamotrigine]; Ultram [tramadol hcl]; Codeine; and Latex    Social History:  The patient  reports that she has quit smoking. She has never used smokeless tobacco. She reports that she does not drink alcohol or use drugs.   Family History:  The patient's family history includes Colon cancer in her maternal uncle; Colon polyps in her father; Pancreatic cancer in her mother and paternal uncle; Prostate cancer in her maternal grandfather; Stroke in her mother; Testicular cancer in her brother.    ROS:  Please see the history of present illness.   Otherwise, review of  systems are positive for none.   All other systems are reviewed and negative.    PHYSICAL EXAM: VS:  BP 129/77   Pulse 96   Ht 5\' 6"  (1.676 m)   Wt 214 lb 9.6 oz (97.3 kg)   LMP 04/09/2013   BMI 34.64 kg/m  , BMI Body mass index is 34.64 kg/m. GENERAL:  Well appearing HEENT:  Pupils equal round and reactive, fundi not visualized, oral mucosa unremarkable NECK:  No jugular venous distention, waveform within normal limits, carotid upstroke brisk and symmetric,  no bruits LYMPHATICS:  No cervical adenopathy LUNGS:  Clear to auscultation bilaterally HEART:  RRR.  PMI not displaced or sustained,S1 and S2 within normal limits, no S3, no S4, no clicks, no rubs, no murmurs ABD:  Flat, positive bowel sounds normal in frequency in pitch, no bruits, no rebound, no guarding, no midline pulsatile mass, no hepatomegaly, no splenomegaly EXT:  2 plus pulses throughout, no edema, no cyanosis no clubbing SKIN:  No rashes no nodules NEURO:  Cranial nerves II through XII grossly intact, motor grossly intact throughout PSYCH:  Cognitively intact, oriented to person place and time   EKG:  EKG is not ordered today. The ekg ordered 11/28/15 demonstrates sinus rhythm. Rate 75 bpm. 10/15/15: Sinus tachycardia.  Rate 111 bpm.    7 Day Event Monitor 12/16/15:  Quality: Fair.  Baseline artifact. Predominant rhythm: sinus rhythm  Average heart rate: 75 bpm Max heart rate: 154 bpm Min heart rate: 49 bpm  No arrhythmias noted.  Lexiscan Myoview 12/05/15:   The left ventricular ejection fraction is normal (55-65%).  Nuclear stress EF: 63%. No wall motion abnormalities  There was no ST segment deviation noted during stress.  This is a low risk study. No ischemia identified. Normal perfusion   Recent Labs: 11/28/2015: TSH 2.80 02/13/2016: BUN 9; Creatinine, Ser 0.66; Hemoglobin 10.8; Platelets 293; Potassium 3.7; Sodium 139   10/15/15: Na 144, K 4.5, BUN 13, creatinine 0.71 Magnesium 2.2   Lipid  Panel No results found for: CHOL, TRIG, HDL, CHOLHDL, VLDL, LDLCALC, LDLDIRECT    Wt Readings from Last 3 Encounters:  02/20/16 214 lb 9.6 oz (97.3 kg)  02/13/16 215 lb (97.5 kg)  02/04/16 216 lb (98 kg)      ASSESSMENT AND PLAN:  # Palpitations: Symptoms have been much better lately.  She thinks that it is due to stress, as she hasn't had any since her husband has been in the nursing home.  Monitor did not reveal any arrhythmias.   # Atypical chest pain: Resolved.  Lexiscan Myoview was negative for ischemia.  She has no risk factors for ASCVD.  We will stop aspirin.   Current medicines are reviewed at length with the patient today.  The patient does not have concerns regarding medicines.  The following changes have been made:  Stop aspirin  Labs/ tests ordered today include:   No orders of the defined types were placed in this encounter.    Disposition:   FU with Tiarrah Saville C. Oval Linsey, MD, [facility] as needed.    This note was written with the assistance of speech recognition software.  Please excuse any transcriptional errors.  Signed, Milley Vining C. Oval Linsey, MD, [facility]  02/20/2016 3:30 PM    [facility]

## 2016-03-03 ENCOUNTER — Ambulatory Visit: Payer: Medicare Other | Admitting: Cardiovascular Disease

## 2016-03-11 ENCOUNTER — Telehealth: Payer: Self-pay | Admitting: Neurology

## 2016-03-11 MED ORDER — HYDROCODONE-ACETAMINOPHEN 5-325 MG PO TABS: 1.0000 | ORAL_TABLET | Freq: Three times a day (TID) | ORAL | 0 refills | Status: DC | PRN

## 2016-03-11 NOTE — Telephone Encounter (Signed)
Rx. awaiting RAS sig/fim 

## 2016-03-11 NOTE — Telephone Encounter (Signed)
Rx. up front [facility]/fim 

## 2016-03-11 NOTE — Telephone Encounter (Signed)
Pt request refill for HYDROcodone-acetaminophen (NORCO/VICODIN) 5-325 MG tablet. Pt is aware the clinic closes at noon tomorrow °

## 2016-03-15 ENCOUNTER — Other Ambulatory Visit: Payer: Self-pay | Admitting: Neurology

## 2016-03-26 ENCOUNTER — Other Ambulatory Visit: Payer: Self-pay | Admitting: Internal Medicine

## 2016-03-26 DIAGNOSIS — Z1231 Encounter for screening mammogram for malignant neoplasm of breast: Secondary | ICD-10-CM

## 2016-04-06 ENCOUNTER — Other Ambulatory Visit: Payer: Self-pay | Admitting: Obstetrics and Gynecology

## 2016-04-06 DIAGNOSIS — N644 Mastodynia: Secondary | ICD-10-CM

## 2016-04-09 ENCOUNTER — Ambulatory Visit
Admission: RE | Admit: 2016-04-09 | Discharge: 2016-04-09 | Disposition: A | Discharge status: Home / Self Care | Payer: Medicare Other | Source: Ambulatory Visit | Attending: Obstetrics and Gynecology | Admitting: Obstetrics and Gynecology

## 2016-04-09 ENCOUNTER — Other Ambulatory Visit: Payer: Self-pay | Admitting: Neurology

## 2016-04-09 DIAGNOSIS — N644 Mastodynia: Secondary | ICD-10-CM

## 2016-05-05 ENCOUNTER — Telehealth: Payer: Self-pay | Admitting: Neurology

## 2016-05-05 MED ORDER — HYDROCODONE-ACETAMINOPHEN 5-325 MG PO TABS: 1.0000 | ORAL_TABLET | Freq: Three times a day (TID) | ORAL | 0 refills | Status: DC | PRN

## 2016-05-05 NOTE — Addendum Note (Signed)
Addended by: Laurence Spates on: 05/05/2016 03:26 PM   Modules accepted: Orders

## 2016-05-05 NOTE — Telephone Encounter (Signed)
Do you have an ENT you recommend?  Rx printed, pt up to date on appts. I will put at front desk once signed.

## 2016-05-05 NOTE — Telephone Encounter (Signed)
Pt requesting refill for HYDROcodone-acetaminophen (NORCO/VICODIN) 5-325 MG tablet.   Also patient would like to know if there is an ENT provider that specializes with patients with MS Please call

## 2016-05-05 NOTE — Telephone Encounter (Signed)
If she is close in [facility] she can either see Dr. Laurance Flatten at [facility] or regional ENT at [place], she can be referred to [facility] on [facility]

## 2016-05-06 MED ORDER — HYDROCODONE-ACETAMINOPHEN 5-325 MG PO TABS: 1.0000 | ORAL_TABLET | Freq: Three times a day (TID) | ORAL | 0 refills | Status: DC | PRN

## 2016-05-06 NOTE — Addendum Note (Signed)
Addended by: Margette Fast on: 05/06/2016 04:40 PM   Modules accepted: Orders

## 2016-05-06 NOTE — Telephone Encounter (Signed)
Dr. Felecia Shelling approved the refill of hydrocodone but the prescription cannot be found. Patient requests to pick up, ok to reprint?

## 2016-05-06 NOTE — Telephone Encounter (Signed)
The prescription was re-written for hydrocodone.

## 2016-05-06 NOTE — Telephone Encounter (Signed)
Called and spoke to pt. Relayed Dr Felecia Shelling message below. She will call location in [place]. Advised rx up front to pick up per Beverlee Nims, RN.

## 2016-05-18 ENCOUNTER — Other Ambulatory Visit: Payer: Self-pay | Admitting: *Deleted

## 2016-05-18 DIAGNOSIS — R0989 Other specified symptoms and signs involving the circulatory and respiratory systems: Secondary | ICD-10-CM

## 2016-05-31 ENCOUNTER — Other Ambulatory Visit (INDEPENDENT_AMBULATORY_CARE_PROVIDER_SITE_OTHER): Payer: Self-pay | Admitting: Otolaryngology

## 2016-05-31 DIAGNOSIS — J320 Chronic maxillary sinusitis: Secondary | ICD-10-CM

## 2016-06-02 ENCOUNTER — Ambulatory Visit
Admission: RE | Admit: 2016-06-02 | Discharge: 2016-06-02 | Disposition: A | Discharge status: Home / Self Care | Payer: Medicare Other | Source: Ambulatory Visit | Attending: Otolaryngology | Admitting: Otolaryngology

## 2016-06-02 DIAGNOSIS — J320 Chronic maxillary sinusitis: Secondary | ICD-10-CM

## 2016-06-08 ENCOUNTER — Ambulatory Visit (INDEPENDENT_AMBULATORY_CARE_PROVIDER_SITE_OTHER): Payer: Medicare Other | Admitting: Neurology

## 2016-06-08 ENCOUNTER — Encounter: Payer: Self-pay | Admitting: Neurology

## 2016-06-08 VITALS — BP 118/70 | HR 89 | Resp 18 | Ht 66.0 in | Wt 217.5 lb

## 2016-06-08 DIAGNOSIS — G35 Multiple sclerosis: Secondary | ICD-10-CM | POA: Diagnosis not present

## 2016-06-08 DIAGNOSIS — F418 Other specified anxiety disorders: Secondary | ICD-10-CM

## 2016-06-08 DIAGNOSIS — G4700 Insomnia, unspecified: Secondary | ICD-10-CM | POA: Diagnosis not present

## 2016-06-08 DIAGNOSIS — G501 Atypical facial pain: Secondary | ICD-10-CM | POA: Insufficient documentation

## 2016-06-08 DIAGNOSIS — R208 Other disturbances of skin sensation: Secondary | ICD-10-CM

## 2016-06-08 DIAGNOSIS — R260 Ataxic gait: Secondary | ICD-10-CM | POA: Diagnosis not present

## 2016-06-08 DIAGNOSIS — R5383 Other fatigue: Secondary | ICD-10-CM

## 2016-06-08 MED ORDER — HYDROCODONE-ACETAMINOPHEN 5-325 MG PO TABS: 1.0000 | ORAL_TABLET | Freq: Three times a day (TID) | ORAL | 0 refills | Status: DC | PRN

## 2016-06-08 MED ORDER — LEVETIRACETAM 500 MG PO TABS: 500.0000 mg | ORAL_TABLET | Freq: Two times a day (BID) | ORAL | 11 refills | Status: DC

## 2016-06-08 MED ORDER — TIZANIDINE HCL 2 MG PO TABS: 2.0000 mg | ORAL_TABLET | Freq: Four times a day (QID) | ORAL | 11 refills | Status: DC | PRN

## 2016-06-08 NOTE — Progress Notes (Signed)
[facility]  PATIENT: Grace [name] DOB: May 02, 1961  REFERRING DOCTOR OR PCP:  [name] SOURCE: patient  _________________________________   HISTORICAL  CHIEF COMPLAINT:  Chief Complaint  Patient presents with  . Multiple Sclerosis    She is not on any MS med.  Sts. for the last 6 mos., she has had a right earache and left sided facial pain.  Sts. she has seen her pcp, ENT and dentist.  Has had CT maxillofacial, but no clear dx. reached.  She also c/o worsening low back and bilat hip pain since 05-19-16, when her husband fell and she caught him to break his fall/fim  . Facial Pain    HISTORY OF PRESENT ILLNESS:  Grace [name] is a 56 year old woman with multiple sclerosis.   She is noting more left facial pain, right facial pain near the ear and lower back.      MS:   She denies recent exacerbation.    She was on Betaseron and Copaxone in the past.   She has npt wanted to try any of the oral agents or IV drugs.   Her last MRI 07/2014 was unchanged compared to 2013 MR so she opted to stay off med's.    Facial Pain:   She has pain in the left maxillary region that is constant with fluctuations.  Pain is milder in the morning and worsens as the day goes on.  She feels like someone punched her.  Touching the area causes pain.    She also has pain near the right ear that is worse with chewing.    She has seen dentistry and had a CT scan but no sinusitis.   Soft tissue was fine.    Lower back pain:   She sees Dr. Lynann Bologna for her lower back pain and has had a bilateral L5S1 facet block.  She felt pain was better for a couple days but then pain increased when she caught her husband's fall a day later.  Pain has continued.  Gait/strength/sensation:  Gait is stable with some stumbles but no recent falls.   She notes a mild limp (but right hip has been hurting more).    Strength is fine.  She has mild spasticity, helped by baclofen and tizanidine (she alternates).     She  has burning dysesthetic pain in the front of the legs and feet and in the the hands (lLeft = Right).   Nortriptyline, gabapentin and trileptal were not effective and Lamictal caused a rash.   Lyrica was not tolerated .   Tramadol was not well tolerated.    She reports mild left leg weakness (old) but notes her gait is slightly clumsy.    Spasticity is helped with soma, tizanidine and baclofen.  Neck pain:   She notes more pain and discomfort in the right neck.     Vision:  She denies any change with her vision with mild left blurriness.        Bladder: She has more urinary urgency since uterine fibroids developed.  Fatigue/sleep:   She reports physical and cognitive fatigue, not helped much by Ritalin 10 mg by mouth 3 times a day .   She did not receive benefit from amantadine or Provigil. She has sleep onset and maintenance insomnia.   Zolpidem has some benefit but she snores more when she takes it.  She was on CPAP years ago but later PSG did not show OSA . Her periodic limb movements of sleep  were improved with gabapentin.   Doxepin seems to help some nights but not every night.       Mood/cognition:   She has depression and some anxiety.   She gets irritable easily. She feels she is getting less benefit from her medications.. She continues on Xanax. She stopped the SSRI.[name] Kitchen    She notes mild cognitive dysfunction.   She reports reduced short term memory and verbal fluency.   Ritalin did not help cognition much and she stopped.     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 She had optic neuritis in 1999. In 2002, she had right-sided numbness. MRIs of the brain have always been normal or showed just a couple of nonspecific foci. A cervical spine MRI showed a focus consistent with demyelination at C5. Additionally, there are 2 smaller foci at T2 and T3-T4.  CSF was also abnormal c/w MS.     She was initially placed on Betaseron and then on Copaxone.  She was unable to tolerate either of these and has not been on any DMT medication since 2003.   She retried Betaseron but stopped after diagnosis of avascular necrosis requiring hip replacement.    Of note, she had had only one course of IV steroids prior to that and has had some afterwards.   She had not wanted to go on Tysabri or Gilenya and opted to go on no disease modifying therapy since 2012. At her last visit on 01/17/2014, she decided to go on Aubagio but never started.     Most recent MRIs are from earlier this year and showed no changes.   REVIEW OF SYSTEMS: Constitutional: No fevers, chills, sweats, or change in appetite.  Has fatigue Eyes: No visual changes, double vision, eye pain Ear, nose and throat: No hearing loss, ear pain, nasal congestion, sore throat Cardiovascular: No chest pain, palpitations Respiratory: No shortness of breath at rest or with exertion.   No wheezes GastrointestinaI: No nausea, vomiting, diarrhea, abdominal pain, fecal incontinence Genitourinary: No dysuria, urinary retention or frequency.  No nocturia. Musculoskeletal: She reports back pain and pain and some of her joints, especially her knees and hips. Integumentary: No rash, pruritus, skin lesions Neurological: as above.  She reports restless leg, insomnia and excessive daytime sleepiness. Psychiatric: No depression at this time.  No anxiety Endocrine: No palpitations, diaphoresis, change in appetite, change in weigh.  He notes heat intolerance and excessive thirst at times. Hematologic/Lymphatic: No anemia, purpura, petechiae. Allergic/Immunologic: No itchy/runny eyes, nasal congestion, recent allergic reactions, rashes  ALLERGIES: Allergies  Allergen Reactions  . Lamictal [Lamotrigine] Shortness Of Breath, Itching and Swelling    Tongue swells  . Mobic [Meloxicam] Anaphylaxis  . Ultram [Tramadol Hcl] Anaphylaxis  . Codeine Itching  . Latex     Latex band-aids cause redness and tears your  skin    HOME MEDICATIONS:  Current Outpatient Prescriptions:  .  ALPRAZolam (XANAX) 0.5 MG tablet, Take 1 tablet (0.5 mg total) by mouth daily as needed for anxiety., Disp: 30 tablet, Rfl: 3 .  Ascorbic Acid (VITAMIN C) 1000 MG tablet, Take 1,000 mg by mouth daily., Disp: , Rfl:  .  baclofen (LIORESAL) 10 MG tablet, Take 2 tablets (20 mg total) by mouth 3 (three) times daily., Disp: 180 each, Rfl: 11 .  carisoprodol (SOMA) 350 MG tablet, TAKE 1 TABLET BY MOUTH 3 TIMES A DAY., Disp: 90 tablet, Rfl: 5 .  diphenhydrAMINE (  BENADRYL) 25 MG tablet, Take 25 mg by mouth 3 (three) times daily., Disp: , Rfl:  .  doxepin (SINEQUAN) 10 MG capsule, One or two at bedtime (Patient taking differently: Take 20 mg by mouth at bedtime. One or two at bedtime), Disp: 60 capsule, Rfl: 11 .  ergocalciferol (VITAMIN D2) 50000 UNITS capsule, Take 50,000 Units by mouth 2 (two) times a week. , Disp: , Rfl:  .  estradiol (ESTRACE) 1 MG tablet, , Disp: , Rfl:  .  etodolac (LODINE) 500 MG tablet, , Disp: , Rfl:  .  HYDROcodone-acetaminophen (NORCO/VICODIN) 5-325 MG tablet, Take 1 tablet by mouth 3 (three) times daily as needed for moderate pain., Disp: 90 tablet, Rfl: 0 .  Melatonin 5 MG TABS, Take 2 tablets by mouth at bedtime., Disp: , Rfl:  .  mometasone (NASONEX) 50 MCG/ACT nasal spray, Place 2 sprays into the nose daily as needed (allergies)., Disp: , Rfl:  .  QC LORATADINE-D 10-240 MG 24 hr tablet, TAKE 1 TABLET BY MOUTH DAILY., Disp: 30 tablet, Rfl: 5 .  Rizatriptan Benzoate (MAXALT PO), Take by mouth., Disp: , Rfl:  .  tiZANidine (ZANAFLEX) 2 MG tablet, Take 1 tablet (2 mg total) by mouth every 6 (six) hours as needed for muscle spasms., Disp: 120 tablet, Rfl: 11 .  Turmeric 1053 MG TABS, Take 1,053 mg by mouth daily., Disp: , Rfl:  .  vitamin E 400 UNIT capsule, Take 400 Units by mouth daily., Disp: , Rfl:  .  zolpidem (AMBIEN) 10 MG tablet, Take 10 mg by mouth at bedtime as needed. For sleep, Disp: , Rfl:  .   levETIRAcetam (KEPPRA) 500 MG tablet, Take 1 tablet (500 mg total) by mouth 2 (two) times daily., Disp: 60 tablet, Rfl: 11  PAST MEDICAL HISTORY: Past Medical History:  Diagnosis Date  . Avascular necrosis ([place])   . Colon polyp    pre-cancerous  . Complication of anesthesia    patient states she requires more anesthesia  . Disorder of soft tissue   . Diverticulosis   . H/O breast biopsy    pre- cancerous cells  . Hand fracture, left    9/87  . Heart palpitations   . History of Holter monitoring   . Hyperlipidemia   . Lhermitte's syndrome   . Migraines   . Multiple sclerosis ([place])    dx in 11/1996  . Multiple sclerosis ([place])   . Neuropathy ([place])   . Numbness and tingling in hands   . Obesity   . OSA (obstructive sleep apnea)    no longer have OSA since stopped taking a MS medication  . Otitis media   . Raynaud's phenomenon   . Vaginitis and vulvovaginitis   . Vitamin D deficiency     PAST SURGICAL HISTORY: Past Surgical History:  Procedure Laterality Date  . ABDOMINAL HYSTERECTOMY Bilateral 02/12/2016   Procedure: HYSTERECTOMY ABDOMINAL WITH BILATERAL SALPINGO OOPHERECTOMY;  Surgeon: Dian Queen, MD;  Location: [facility];  Service: Gynecology;  Laterality: Bilateral;  . BREAST BIOPSY     core/left breast  . CHOLECYSTECTOMY    . COLONOSCOPY     1997/2001  . DILATION AND CURETTAGE OF UTERUS    . ENDOMETRIAL ABLATION    . LAPAROSCOPIC ABDOMINAL EXPLORATION     ovaries and intestines bond together  . LEFT HEART CATHETERIZATION WITH CORONARY ANGIOGRAM N/A 06/21/2011   Procedure: LEFT HEART CATHETERIZATION WITH CORONARY ANGIOGRAM;  Surgeon: Leonie Man, MD;  Location: [facility] CATH LAB;  Service: Cardiovascular;  Laterality: N/A;  . LUMBAR PUNCTURE     11/01/1996  . SALPINGOOPHORECTOMY Bilateral 02/12/2016   Procedure: BILATERAL SALPINGO OOPHORECTOMY;  Surgeon: Dian Queen, MD;  Location: [facility];  Service: Gynecology;  Laterality: Bilateral;  . TOTAL HIP ARTHROPLASTY      left hip  6/02  /right hip12/04    FAMILY HISTORY: Family History  Problem Relation Age of Onset  . Pancreatic cancer Mother   . Stroke Mother   . Testicular cancer Brother   . Colon cancer Maternal Uncle   . Pancreatic cancer Paternal Uncle   . Prostate cancer Maternal Grandfather   . Colon polyps Father     SOCIAL HISTORY:  Social History   Social History  . Marital status: Married    Spouse name: N/A  . Number of children: N/A  . Years of education: N/A   Occupational History  . Not on file.   Social History Main Topics  . Smoking status: Former Research scientist (life sciences)  . Smokeless tobacco: Never Used  . Alcohol use No  . Drug use: No  . Sexual activity: Not on file   Other Topics Concern  . Not on file   Social History Narrative  . No narrative on file     PHYSICAL EXAM  Vitals:   06/08/16 1403  BP: 118/70  Pulse: 89  Resp: 18  Weight: 217 lb 8 oz (98.7 kg)  Height: 5\' 6"  (1.676 m)    Body mass index is 35.11 kg/m.   General: The patient is well-developed and well-nourished and in no acute distress  Neurologic Exam  Mental status: The patient is alert and oriented x 3 at the time of the examination. The patient has apparent normal recent and remote memory, with an apparently normal attention span and concentration ability.   Speech is normal.  Cranial nerves: Extraocular movements are full. Pupils show a left APD.  Colors and acuity are reduced OS compared to OD. Facial symmetry is present. There is reduced left facial sensation to soft touch .Facial strength is normal.  Trapezius and sternocleidomastoid strength is normal. No dysarthria is noted.  The tongue is midline, and the patient has symmetric elevation of the soft palate. No obvious hearing deficits are noted.  Motor:  Muscle bulk is normal.   Tone is increased in legs, left > right. Strength is  5 / 5 in all 4 extremities except 4+/5 left EHL  Sensory: Sensory testing shows reduced right touch and  vibration sensation.  Coordination: Cerebellar testing reveals good finger-nose-finger and mildly reduced left heel-to-shin.  Gait and station: Station is normal.   Gait is wide Tandem gait is very wide. Romberg is negative.   Reflexes: Deep tendon reflexes are symmetric and normal bilaterally.        DIAGNOSTIC DATA (LABS, IMAGING, TESTING) - I reviewed patient records, labs, notes, testing and imaging myself where available.    ASSESSMENT AND PLAN  Multiple sclerosis ([place]) - Plan: MR BRAIN W WO CONTRAST  Ataxic gait - Plan: MR BRAIN W WO CONTRAST  Other fatigue  Dysesthesia  Facial pain, atypical - Plan: MR BRAIN W WO CONTRAST  Depression with anxiety  Insomnia, unspecified type   1.  We had a long discussion about her multiple sclerosis and treatment with a disease modifying therapy. At this point, she wishes to remain off of any treatment as she is concerned about risks with the newer agents and had problems with the older agents. Of note, her last MRI was stable  and we discussed repeating another MRI of the brain to determine if there is any subclinical progression.,   If she has had subclinical progression, I would strongly urge her to reconsider a disease modifying therapy and we briefly discussed some options..    2.  Renew hydrocodone and tizanidine.   Continueother medications. 3.  She needs to stay active and exercise as tolerated.  She will return to see me and 6 months or sooner if she has new or worsening neurologic symptoms.  40 minute face to face evaluation with greater than one time counseling and coordinating care about her MS and related symptoms.  Sharlena Kristensen A. Felecia Shelling, MD, PhD [id number], [id number] PM Certified in Neurology, Clinical Neurophysiology, Sleep Medicine, Pain Medicine and Neuroimaging  [facility] [address] [place], [place] [address] [phone]

## 2016-06-10 ENCOUNTER — Other Ambulatory Visit: Payer: Self-pay | Admitting: Dermatology

## 2016-06-22 ENCOUNTER — Encounter: Payer: Self-pay | Admitting: Vascular Surgery

## 2016-06-30 ENCOUNTER — Encounter: Payer: Self-pay | Admitting: Vascular Surgery

## 2016-06-30 ENCOUNTER — Ambulatory Visit (INDEPENDENT_AMBULATORY_CARE_PROVIDER_SITE_OTHER): Payer: Medicare Other | Admitting: Vascular Surgery

## 2016-06-30 ENCOUNTER — Ambulatory Visit (HOSPITAL_COMMUNITY)
Admission: RE | Admit: 2016-06-30 | Discharge: 2016-06-30 | Disposition: A | Discharge status: Home / Self Care | Payer: Medicare Other | Source: Ambulatory Visit | Attending: Vascular Surgery | Admitting: Vascular Surgery

## 2016-06-30 VITALS — BP 115/76 | HR 84 | Temp 97.3°F | Resp 20 | Ht 65.5 in | Wt 213.0 lb

## 2016-06-30 DIAGNOSIS — M79605 Pain in left leg: Secondary | ICD-10-CM | POA: Diagnosis not present

## 2016-06-30 DIAGNOSIS — M79604 Pain in right leg: Secondary | ICD-10-CM | POA: Diagnosis not present

## 2016-06-30 DIAGNOSIS — R938 Abnormal findings on diagnostic imaging of other specified body structures: Secondary | ICD-10-CM | POA: Diagnosis not present

## 2016-06-30 DIAGNOSIS — R0989 Other specified symptoms and signs involving the circulatory and respiratory systems: Secondary | ICD-10-CM | POA: Insufficient documentation

## 2016-06-30 NOTE — Progress Notes (Signed)
[facility] [place]  Patient name: Grace [name] MRN: [id number] DOB: 1960-12-13 Sex: female  REASON FOR CONSULT: Evaluation of lower from a discomfort  HPI: Grace [name] is a 56 y.o. female, who is seen today for evaluation of lower extremity discomfort. She reports pain from her knees down. This can occur most frequently with sitting. Is not related to walking. She also reports that her legs and feet turn white and red and purple with dependency. She does not have any history of cardiac disease and does not have any history of peripheral vascular occlusive disease. She does have heart palpitations with negative workup. She does have scattered telangiectasia over lower extremities and reports that these can become more distended and painful at times. No history of DVT  Past Medical History:  Diagnosis Date  . Avascular necrosis ([place])   . Colon polyp    pre-cancerous  . Complication of anesthesia    patient states she requires more anesthesia  . Disorder of soft tissue   . Diverticulosis   . H/O breast biopsy    pre- cancerous cells  . Hand fracture, left    9/87  . Heart palpitations   . History of Holter monitoring   . Hyperlipidemia   . Lhermitte's syndrome   . Migraines   . Multiple sclerosis ([place])    dx in 11/1996  . Multiple sclerosis ([place])   . Neuropathy ([place])   . Numbness and tingling in hands   . Obesity   . OSA (obstructive sleep apnea)    no longer have OSA since stopped taking a MS medication  . Otitis media   . Raynaud's phenomenon   . Vaginitis and vulvovaginitis   . Vitamin D deficiency     Family History  Problem Relation Age of Onset  . Pancreatic cancer Mother   . Stroke Mother   . Testicular cancer Brother   . Colon cancer Maternal Uncle   . Pancreatic cancer Paternal Uncle   . Prostate cancer Maternal Grandfather   . Colon polyps Father     SOCIAL HISTORY: Social History   Social  History  . Marital status: Married    Spouse name: N/A  . Number of children: N/A  . Years of education: N/A   Occupational History  . Not on file.   Social History Main Topics  . Smoking status: Former Smoker    Types: Cigarettes    Quit date: 07/01/1995  . Smokeless tobacco: Never Used  . Alcohol use No  . Drug use: No  . Sexual activity: Not on file   Other Topics Concern  . Not on file   Social History Narrative  . No narrative on file    Allergies  Allergen Reactions  . Lamictal [Lamotrigine] Shortness Of Breath, Itching and Swelling    Tongue swells  . Mobic [Meloxicam] Anaphylaxis  . Ultram [Tramadol Hcl] Anaphylaxis  . Codeine Itching  . Latex     Latex band-aids cause redness and tears your skin  . Tape Rash    Current Outpatient Prescriptions  Medication Sig Dispense Refill  . ALPRAZolam (XANAX) 0.5 MG tablet Take 1 tablet (0.5 mg total) by mouth daily as needed for anxiety. 30 tablet 3  . Ascorbic Acid (VITAMIN C) 1000 MG tablet Take 1,000 mg by mouth daily.    . baclofen (LIORESAL) 10 MG tablet Take 2 tablets (20 mg total) by mouth 3 (three) times daily. 180 each 11  .  carisoprodol (SOMA) 350 MG tablet TAKE 1 TABLET BY MOUTH 3 TIMES A DAY. 90 tablet 5  . diphenhydrAMINE (BENADRYL) 25 MG tablet Take 25 mg by mouth 3 (three) times daily.    [name] Kitchen doxepin (SINEQUAN) 10 MG capsule One or two at bedtime (Patient taking differently: Take 20 mg by mouth at bedtime. One or two at bedtime) 60 capsule 11  . ergocalciferol (VITAMIN D2) 50000 UNITS capsule Take 50,000 Units by mouth once a week.     . estradiol (ESTRACE) 1 MG tablet     . etodolac (LODINE) 500 MG tablet 500 mg 2 (two) times daily.     [name] Kitchen HYDROcodone-acetaminophen (NORCO/VICODIN) 5-325 MG tablet Take 1 tablet by mouth 3 (three) times daily as needed for moderate pain. 90 tablet 0  . levETIRAcetam (KEPPRA) 500 MG tablet Take 1 tablet (500 mg total) by mouth 2 (two) times daily. 60 tablet 11  . Melatonin 5 MG  TABS Take 2 tablets by mouth at bedtime.    . mometasone (NASONEX) 50 MCG/ACT nasal spray Place 2 sprays into the nose daily as needed (allergies).    . QC LORATADINE-D 10-240 MG 24 hr tablet TAKE 1 TABLET BY MOUTH DAILY. 30 tablet 5  . Rizatriptan Benzoate (MAXALT PO) Take 10 mg by mouth as needed.     [name] Kitchen tiZANidine (ZANAFLEX) 2 MG tablet Take 1 tablet (2 mg total) by mouth every 6 (six) hours as needed for muscle spasms. 120 tablet 11  . Turmeric 1053 MG TABS Take 1,053 mg by mouth daily.    . vitamin E 400 UNIT capsule Take 400 Units by mouth daily.    [name] Kitchen zolpidem (AMBIEN) 10 MG tablet Take 10 mg by mouth at bedtime as needed. For sleep     No current facility-administered medications for this visit.     REVIEW OF SYSTEMS:  [X]  denotes positive finding, [ ]  denotes negative finding Cardiac  Comments:  Chest pain or chest pressure: x   Shortness of breath upon exertion: x   Short of breath when lying flat: x   Irregular heart rhythm:        Vascular    Pain in calf, thigh, or hip brought on by ambulation: x   Pain in feet at night that wakes you up from your sleep:  x   Blood clot in your veins:    Leg swelling:         Pulmonary    Oxygen at home:    Productive cough:     Wheezing:         Neurologic    Sudden weakness in arms or legs:  x   Sudden numbness in arms or legs:  x   Sudden onset of difficulty speaking or slurred speech: x   Temporary loss of vision in one eye:     Problems with dizziness:  x       Gastrointestinal    Blood in stool:     Vomited blood:         Genitourinary    Burning when urinating:     Blood in urine:        Psychiatric    Major depression:         Hematologic    Bleeding problems:    Problems with blood clotting too easily:        Skin    Rashes or ulcers:        Constitutional    Fever or chills:  PHYSICAL EXAM: Vitals:   06/30/16 1214  BP: 115/76  Pulse: 84  Resp: 20  Temp: 97.3 F (36.3 C)  TempSrc: Oral    SpO2: 96%  Weight: 213 lb (96.6 kg)  Height: 5' 5.5" (1.664 m)    GENERAL: The patient is a well-nourished female, in no acute distress. The vital signs are documented above. CARDIOVASCULAR: Carotid arteries without bruits bilaterally. Radial pulses are 2+. She has 2+ dorsalis pedis pulses bilaterally. She has a 2+ right posterior tibial pulse and a 1+ left posterior tibial pulse PULMONARY: There is good air exchange  ABDOMEN: Soft and non-tender  MUSCULOSKELETAL: There are no major deformities or cyanosis. NEUROLOGIC: No focal weakness or paresthesias are detected. SKIN: There are no ulcers or rashes noted. PSYCHIATRIC: The patient has a normal affect.  DATA:  She underwent noninvasive arterial studies in our office revealing normal waveforms and normal pressures bilaterally in both lower extremity.  I imaged her veins with SonoSite ultrasound and this showed no evidence of significant dilatation and her saphenous vein and no evidence of reflux in her saphenous vein  MEDICAL ISSUES: No evidence of arterial insufficiency. I did explain that the SonoSite ultrasound was screening only and was not a formal duplex. I feel that the comfortable that this is a normal study and would not recommend formal venous duplex. Explained that if she had deep venous reflux the treatment only would be elevation and compression. She does not have physical findings of venous hypertension. She was reassured with this discussion will see [place] again on as-needed basis. I explained that the telangiectasia may be more prominent if she has extra fluid volume on board but would not cause her any difficulty in the future   Rosetta Posner, MD FACS [facility] Tel [phone] Pager [phone]

## 2016-07-03 ENCOUNTER — Ambulatory Visit
Admission: RE | Admit: 2016-07-03 | Discharge: 2016-07-03 | Disposition: A | Discharge status: Home / Self Care | Payer: Medicare Other | Source: Ambulatory Visit | Attending: Neurology | Admitting: Neurology

## 2016-07-03 ENCOUNTER — Other Ambulatory Visit: Payer: Medicare Other

## 2016-07-03 DIAGNOSIS — G35 Multiple sclerosis: Secondary | ICD-10-CM | POA: Diagnosis not present

## 2016-07-03 DIAGNOSIS — R260 Ataxic gait: Secondary | ICD-10-CM | POA: Diagnosis not present

## 2016-07-03 DIAGNOSIS — G501 Atypical facial pain: Secondary | ICD-10-CM

## 2016-07-03 MED ORDER — GADOBENATE DIMEGLUMINE 529 MG/ML IV SOLN
20.0000 mL | Freq: Once | INTRAVENOUS | Status: AC | PRN
  Administered 2016-07-03: 20 mL via INTRAVENOUS

## 2016-07-05 ENCOUNTER — Telehealth: Payer: Self-pay | Admitting: *Deleted

## 2016-07-05 NOTE — Telephone Encounter (Signed)
-----   Message from Britt Bottom, MD sent at 07/03/2016  4:58 PM EST ----- Please let her know that the MRI is unchanged from her previous one.

## 2016-07-05 NOTE — Telephone Encounter (Signed)
I have spoken with Grace [name] this afternoon and per RAS, reviewed MRI results as below/fim

## 2016-08-10 ENCOUNTER — Telehealth: Payer: Self-pay | Admitting: Neurology

## 2016-08-10 NOTE — Telephone Encounter (Signed)
Patient called office requesting refill for HYDROcodone-acetaminophen (NORCO/VICODIN) 5-325 MG tablet. °

## 2016-08-11 MED ORDER — HYDROCODONE-ACETAMINOPHEN 5-325 MG PO TABS: 1.0000 | ORAL_TABLET | Freq: Three times a day (TID) | ORAL | 0 refills | Status: DC | PRN

## 2016-08-11 NOTE — Telephone Encounter (Signed)
Rx. awaiting RAS sig/fim 

## 2016-08-11 NOTE — Addendum Note (Signed)
Addended by: France Ravens I on: 08/11/2016 08:30 AM   Modules accepted: Orders

## 2016-08-11 NOTE — Telephone Encounter (Signed)
Rx. up front [facility]/fim 

## 2016-09-20 ENCOUNTER — Telehealth: Payer: Self-pay | Admitting: Neurology

## 2016-09-20 MED ORDER — HYDROCODONE-ACETAMINOPHEN 5-325 MG PO TABS: 1.0000 | ORAL_TABLET | Freq: Three times a day (TID) | ORAL | 0 refills | Status: DC | PRN

## 2016-09-20 NOTE — Telephone Encounter (Signed)
Patient requesting refill of HYDROcodone-acetaminophen (NORCO/VICODIN) 5-325 MG tablet. ° ° °

## 2016-09-20 NOTE — Addendum Note (Signed)
Addended by: France Ravens I on: 09/20/2016 04:38 PM   Modules accepted: Orders

## 2016-09-20 NOTE — Telephone Encounter (Signed)
Rx. up front [facility]/fim 

## 2016-09-20 NOTE — Telephone Encounter (Signed)
Rx. awaiting RAS sig/fim 

## 2016-11-08 ENCOUNTER — Ambulatory Visit (INDEPENDENT_AMBULATORY_CARE_PROVIDER_SITE_OTHER): Payer: Medicare Other | Admitting: Neurology

## 2016-11-08 ENCOUNTER — Encounter: Payer: Self-pay | Admitting: Neurology

## 2016-11-08 VITALS — BP 97/65 | HR 79 | Resp 18 | Ht 65.5 in | Wt 202.5 lb

## 2016-11-08 DIAGNOSIS — G501 Atypical facial pain: Secondary | ICD-10-CM

## 2016-11-08 DIAGNOSIS — R260 Ataxic gait: Secondary | ICD-10-CM | POA: Diagnosis not present

## 2016-11-08 DIAGNOSIS — G35 Multiple sclerosis: Secondary | ICD-10-CM | POA: Diagnosis not present

## 2016-11-08 DIAGNOSIS — G4700 Insomnia, unspecified: Secondary | ICD-10-CM

## 2016-11-08 DIAGNOSIS — R5383 Other fatigue: Secondary | ICD-10-CM

## 2016-11-08 DIAGNOSIS — R208 Other disturbances of skin sensation: Secondary | ICD-10-CM | POA: Diagnosis not present

## 2016-11-08 DIAGNOSIS — G35D Multiple sclerosis, unspecified: Secondary | ICD-10-CM

## 2016-11-08 MED ORDER — DOXEPIN HCL 10 MG PO CAPS: ORAL_CAPSULE | ORAL | 11 refills | Status: DC

## 2016-11-08 MED ORDER — BACLOFEN 10 MG PO TABS: 20.0000 mg | ORAL_TABLET | Freq: Three times a day (TID) | ORAL | 11 refills | Status: DC

## 2016-11-08 MED ORDER — TIZANIDINE HCL 2 MG PO TABS: 2.0000 mg | ORAL_TABLET | Freq: Four times a day (QID) | ORAL | 11 refills | Status: DC | PRN

## 2016-11-08 MED ORDER — ESCITALOPRAM OXALATE 10 MG PO TABS: 10.0000 mg | ORAL_TABLET | Freq: Every day | ORAL | 5 refills | Status: DC

## 2016-11-08 MED ORDER — CARISOPRODOL 350 MG PO TABS: 350.0000 mg | ORAL_TABLET | Freq: Three times a day (TID) | ORAL | 5 refills | Status: DC

## 2016-11-08 MED ORDER — HYDROCODONE-ACETAMINOPHEN 5-325 MG PO TABS: 1.0000 | ORAL_TABLET | Freq: Three times a day (TID) | ORAL | 0 refills | Status: DC | PRN

## 2016-11-08 MED ORDER — PHENTERMINE HCL 30 MG PO CAPS: 30.0000 mg | ORAL_CAPSULE | ORAL | 5 refills | Status: DC

## 2016-11-08 NOTE — Progress Notes (Signed)
[facility]  PATIENT: [name] DOB: 02-24-1961  REFERRING DOCTOR OR PCP:  Grace [name] SOURCE: patient  _________________________________   HISTORICAL  CHIEF COMPLAINT:  Chief Complaint  Patient presents with  . Multiple Sclerosis    Sts. she has had "flashes of light" blurry vision, some eye pain (left eye) for the last 3 mos.   Has seen Dr. Gershon [name] and sts. he didn't find anything wrong/fim  . Visual Disturbance    HISTORY OF PRESENT ILLNESS:  Grace [name] is a 56 year old woman with multiple sclerosis.     MS:   She denies recent exacerbation But notes more left facial pain and some vision blurring at times..    She was on Betaseron and Copaxone in the past.   She has not wanted to try any of the oral agents or IV drugs.   Her last MRI 2/18 and was unchanged from the previous 2 MRIs (2016 2013) and just shows a few fairly nonspecific foci.  She is noting more left facial pain, right facial pain near the ear and lower back.    MRI of the brain 06/2016 showed a few stable T2 spots which is fairly non-specific and unchanged compared to her previous MRI form 2016.     Facial Pain:   She was experiencing facial pain in the left cheek that fluctuated.  We started Keppra and it helped some.  \ Lower back pain:   She sees Dr. Lynann [name] for her lower back pain and has had a bilateral L5S1 facet block.  She felt pain was better for a couple days but then pain increased when she caught her husband's fall a day later.  Pain has continued.  Gait/strength/sensation:  Gait is stable with some stumbles but no recent falls.   She notes a mild limp (but right hip has been hurting more).    Strength is fine.  She has mild spasticity, helped by baclofen and tizanidine (she alternates).     She has burning dysesthetic pain in the front of the legs and feet and in the the hands (lLeft = Right).   Nortriptyline, gabapentin and trileptal were not effective and Lamictal caused a  rash.   Lyrica was not tolerated .   Tramadol was not well tolerated.    She reports mild left leg weakness (old) but notes her gait is slightly clumsy.    Spasticity is helped with soma, tizanidine and baclofen.   Vision:  She also is noting left eye pain and blurry vision but her eye exam was normal by ophthalmology.        Bladder: She notes urinary urgency and frequency.  Fatigue/sleep:   She reports a lot of physical and cognitive fatigue. Ritalin and Provigil have not helped her any. She also has sleep onset and sleep maintenance insomnia.   She takes muscle relaxants and doxepin at bedtime. She was on CPAP years ago but later PSG did not show OSA . Her periodic limb movements of sleep were improved with gabapentin.       Mood/cognition:  She feels her to depression has been worse. Her husband is disabled and she feels mood has been worse since his stroke. She has mild anxiety.   She was once on SSRI it is not sure if it helped her much.   She notes mild cognitive dysfunction.   She reports reduced short term memory and verbal fluency.   Ritalin did not help cognition much .  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 She had optic neuritis in 1999. In 2002, she had right-sided numbness. MRIs of the brain show just a couple of nonspecific foci. A cervical spine MRI showed a focus consistent with demyelination at C5. Additionally, there are 2 smaller foci at T2 and T3-T4.  CSF was also abnormal c/w MS.     She was initially placed on Betaseron and then on Copaxone. She was unable to tolerate either of these and has not been on any DMT medication since 2003.   She retried Betaseron but stopped after diagnosis of avascular necrosis requiring hip replacement.    Of note, she had had only one course of IV steroids prior to that and has had some afterwards.   She had not wanted to go on Tysabri or Gilenya and opted to go on no disease modifying  therapy since 2012. At her last visit on 01/17/2014, she decided to go on Aubagio but never started.     Most recent MRIs are from earlier this year and showed no changes.   REVIEW OF SYSTEMS: Constitutional: No fevers, chills, sweats, or change in appetite.  Has fatigue Eyes: No visual changes, double vision, eye pain Ear, nose and throat: No hearing loss, ear pain, nasal congestion, sore throat Cardiovascular: No chest pain, palpitations Respiratory: No shortness of breath at rest or with exertion.   No wheezes GastrointestinaI: No nausea, vomiting, diarrhea, abdominal pain, fecal incontinence Genitourinary: No dysuria, urinary retention or frequency.  No nocturia. Musculoskeletal: She reports back pain and pain and some of her joints, especially her knees and hips. Integumentary: No rash, pruritus, skin lesions Neurological: as above.  She reports restless leg, insomnia and excessive daytime sleepiness. Psychiatric: No depression at this time.  No anxiety Endocrine: No palpitations, diaphoresis, change in appetite, change in weigh.  He notes heat intolerance and excessive thirst at times. Hematologic/Lymphatic: No anemia, purpura, petechiae. Allergic/Immunologic: No itchy/runny eyes, nasal congestion, recent allergic reactions, rashes  ALLERGIES: Allergies  Allergen Reactions  . Lamictal [Lamotrigine] Shortness Of Breath, Itching and Swelling    Tongue swells  . Mobic [Meloxicam] Anaphylaxis  . Ultram [Tramadol Hcl] Anaphylaxis  . Codeine Itching  . Latex     Latex band-aids cause redness and tears your skin  . Tape Rash    HOME MEDICATIONS:  Current Outpatient Prescriptions:  .  ALPRAZolam (XANAX) 0.5 MG tablet, Take 1 tablet (0.5 mg total) by mouth daily as needed for anxiety., Disp: 30 tablet, Rfl: 3 .  Ascorbic Acid (VITAMIN C) 1000 MG tablet, Take 1,000 mg by mouth daily., Disp: , Rfl:  .  baclofen (LIORESAL) 10 MG tablet, Take 2 tablets (20 mg total) by mouth 3  (three) times daily., Disp: 180 each, Rfl: 11 .  carisoprodol (SOMA) 350 MG tablet, Take 1 tablet (350 mg total) by mouth 3 (three) times daily., Disp: 90 tablet, Rfl: 5 .  diphenhydrAMINE (BENADRYL) 25 MG tablet, Take 25 mg by mouth 3 (three) times daily., Disp: , Rfl:  .  doxepin (SINEQUAN) 10 MG capsule, One or two at bedtime, Disp: 60 capsule, Rfl: 11 .  ergocalciferol (VITAMIN D2) 50000 UNITS capsule, Take 50,000 Units by mouth once a week. , Disp: , Rfl:  .  estradiol (ESTRACE) 1 MG tablet, , Disp: , Rfl:  .  etodolac (LODINE) 500 MG tablet, 500 mg 2 (two) times daily. , Disp: , Rfl:  .  HYDROcodone-acetaminophen (NORCO/VICODIN) 5-325 MG tablet, Take 1 tablet by mouth 3 (three) times daily as needed for moderate pain., Disp: 90 tablet, Rfl: 0 .  levETIRAcetam (KEPPRA) 500 MG tablet, Take 1 tablet (500 mg total) by mouth 2 (two) times daily., Disp: 60 tablet, Rfl: 11 .  Melatonin 5 MG TABS, Take 2 tablets by mouth at bedtime., Disp: , Rfl:  .  mometasone (NASONEX) 50 MCG/ACT nasal spray, Place 2 sprays into the nose daily as needed (allergies)., Disp: , Rfl:  .  QC LORATADINE-D 10-240 MG 24 hr tablet, TAKE 1 TABLET BY MOUTH DAILY., Disp: 30 tablet, Rfl: 5 .  Rizatriptan Benzoate (MAXALT PO), Take 10 mg by mouth as needed. , Disp: , Rfl:  .  tiZANidine (ZANAFLEX) 2 MG tablet, Take 1 tablet (2 mg total) by mouth every 6 (six) hours as needed for muscle spasms., Disp: 120 tablet, Rfl: 11 .  Turmeric 1053 MG TABS, Take 1,053 mg by mouth daily., Disp: , Rfl:  .  vitamin E 400 UNIT capsule, Take 400 Units by mouth daily., Disp: , Rfl:  .  zolpidem (AMBIEN) 10 MG tablet, Take 10 mg by mouth at bedtime as needed. For sleep, Disp: , Rfl:  .  escitalopram (LEXAPRO) 10 MG tablet, Take 1 tablet (10 mg total) by mouth daily., Disp: 30 tablet, Rfl: 5 .  phentermine 30 MG capsule, Take 1 capsule (30 mg total) by mouth every morning., Disp: 30 capsule, Rfl: 5  PAST MEDICAL HISTORY: Past Medical History:    Diagnosis Date  . Avascular necrosis ([place])   . Colon polyp    pre-cancerous  . Complication of anesthesia    patient states she requires more anesthesia  . Disorder of soft tissue   . Diverticulosis   . H/O breast biopsy    pre- cancerous cells  . Hand fracture, left    9/87  . Heart palpitations   . History of Holter monitoring   . Hyperlipidemia   . Lhermitte's syndrome   . Migraines   . Multiple sclerosis ([place])    dx in 11/1996  . Multiple sclerosis ([place]   . Neuropathy   . Numbness and tingling in hands   . Obesity   . OSA (obstructive sleep apnea)    no longer have OSA since stopped taking a MS medication  . Otitis media   . Raynaud's phenomenon   . Vaginitis and vulvovaginitis   . Vitamin D deficiency     PAST SURGICAL HISTORY: Past Surgical History:  Procedure Laterality Date  . ABDOMINAL HYSTERECTOMY Bilateral 02/12/2016   Procedure: HYSTERECTOMY ABDOMINAL WITH BILATERAL SALPINGO OOPHERECTOMY;  Surgeon: Dian Queen, MD;  Location: [facility];  Service: Gynecology;  Laterality: Bilateral;  . BREAST BIOPSY     core/left breast  . CHOLECYSTECTOMY    . COLONOSCOPY     1997/2001  . DILATION AND CURETTAGE OF UTERUS    . ENDOMETRIAL ABLATION    . LAPAROSCOPIC ABDOMINAL EXPLORATION     ovaries and intestines bond together  . LEFT HEART CATHETERIZATION WITH CORONARY ANGIOGRAM N/A 06/21/2011   Procedure: LEFT HEART CATHETERIZATION WITH CORONARY ANGIOGRAM;  Surgeon: Leonie Man, MD;  Location: [facility] CATH LAB;  Service: Cardiovascular;  Laterality: N/A;  . LUMBAR PUNCTURE     11/01/1996  . SALPINGOOPHORECTOMY Bilateral 02/12/2016   Procedure: BILATERAL SALPINGO OOPHORECTOMY;  Surgeon: Dian Queen, MD;  Location: [facility];  Service: Gynecology;  Laterality: Bilateral;  . TOTAL HIP ARTHROPLASTY     left hip  6/02  /  right hip12/04    FAMILY HISTORY: Family History  Problem Relation Age of Onset  . Pancreatic cancer Mother   . Stroke Mother   . Testicular cancer  Brother   . Colon cancer Maternal Uncle   . Pancreatic cancer Paternal Uncle   . Prostate cancer Maternal Grandfather   . Colon polyps Father     SOCIAL HISTORY:  Social History   Social History  . Marital status: Married    Spouse name: N/A  . Number of children: N/A  . Years of education: N/A   Occupational History  . Not on file.   Social History Main Topics  . Smoking status: Former Smoker    Types: Cigarettes    Quit date: 07/01/1995  . Smokeless tobacco: Never Used  . Alcohol use No  . Drug use: No  . Sexual activity: Not on file   Other Topics Concern  . Not on file   Social History Narrative  . No narrative on file     PHYSICAL EXAM  Vitals:   11/08/16 1047  BP: 97/65  Pulse: 79  Resp: 18  Weight: 202 lb 8 oz (91.9 kg)  Height: 5' 5.5" (1.664 m)    Body mass index is 33.19 kg/m.   General: The patient is well-developed and well-nourished and in no acute distress  Neurologic Exam  Mental status: The patient is alert and oriented x 3 at the time of the examination. The patient has apparent normal recent and remote memory, with an apparently normal attention span and concentration ability.   Speech is normal.  Cranial nerves: Extraocular movements are full.   She has a left a fair pupillary defect and colors are reduced on the left compared to the right. Facial strength  is normal though she feels mild reduced left facial sensation.  Trapezius and sternocleidomastoid strength is normal. No dysarthria is noted.  The tongue is midline, and the patient has symmetric elevation of the soft palate. No obvious hearing deficits are noted.  Motor:  Muscle bulk is normal.   Tone is increased in legs, left > right. Strength is  5 / 5 in all 4 extremities except 4+/5 left EHL  Sensory: Sensory testing shows reduced touch and vibration sensation on the right side..  Coordination: Cerebellar testing reveals good finger-nose-finger and mildly reduced left  heel-to-shin.  Gait and station: Station is normal.   The gait is wide. She has difficulty doing a tandem gait. The Romberg is negative.  Reflexes: Deep tendon reflexes are symmetric and normal bilaterally.        DIAGNOSTIC DATA (LABS, IMAGING, TESTING) - I reviewed patient records, labs, notes, testing and imaging myself where available.    ASSESSMENT AND PLAN  Multiple sclerosis ([facility])  Ataxic gait  Dysesthesia  Facial pain, atypical  Insomnia, unspecified type  Other fatigue   1.  She had transverse myelitis and optic neuritis in the past but brain MRI has been fairly normal. She has been stable for many years and the MRI of the brain has not changed since 2013.  She will remain off a disease modifying therapy.  2.  Renew hydrocodone, baclofen and tizanidine.  Add phentermine for fatigue/weight loss.   Add Lexapro for mood 3.  She needs to stay active and exercise as tolerated.   Try to lose weight  She will return to see me and 6 months or sooner if she has new or worsening neurologic symptoms.   Chazz Philson A. Felecia Shelling, MD, PhD  0/0/4599, 77:41 AM Certified in Neurology, Clinical Neurophysiology, Sleep Medicine, Pain Medicine and Neuroimaging  [facility] [address], [place], [place] [address] [phone]

## 2016-11-29 ENCOUNTER — Telehealth: Payer: Self-pay | Admitting: Neurology

## 2016-11-29 MED ORDER — RIZATRIPTAN BENZOATE 10 MG PO TBDP: 10.0000 mg | ORAL_TABLET | ORAL | 11 refills | Status: DC | PRN

## 2016-11-29 NOTE — Telephone Encounter (Signed)
Maxalt rx. faxed to [facility] per pt's request.  She sts. sx. were worse yesterday, improving today.  She will call if they change or worsen, or new sx. present/fim

## 2016-11-29 NOTE — Addendum Note (Signed)
Addended by: France Ravens I on: 11/29/2016 04:18 PM   Modules accepted: Orders

## 2016-11-29 NOTE — Telephone Encounter (Signed)
Pt said that it has been over a year but she would like to know if she could get a refill of Rizatriptan Benzoate (MAXALT PO)(pt said she would need the kind to melt under her tongue).    Pt also states that either  phentermine 30 MG capsule   or  escitalopram (LEXAPRO) 10 MG tablet or both are causing her migraines.  Pt said that she has stopped both for the last 2 days, she has had a head ache for about a week and a half.  Pt said it feels like from the scalp to her head it feels tingly and num.  Pt also states that her arms heavy, please call.

## 2017-01-03 ENCOUNTER — Telehealth: Payer: Self-pay | Admitting: Neurology

## 2017-01-03 MED ORDER — HYDROCODONE-ACETAMINOPHEN 5-325 MG PO TABS: 1.0000 | ORAL_TABLET | Freq: Three times a day (TID) | ORAL | 0 refills | Status: DC | PRN

## 2017-01-03 NOTE — Telephone Encounter (Signed)
Pt request refill for HYDROcodone-acetaminophen (NORCO/VICODIN) 5-325 MG tablet

## 2017-01-03 NOTE — Addendum Note (Signed)
Addended by: France Ravens I on: 01/03/2017 04:56 PM   Modules accepted: Orders

## 2017-01-03 NOTE — Telephone Encounter (Signed)
Rx. awaiting RAS sig/fim 

## 2017-01-04 NOTE — Telephone Encounter (Signed)
Rx. up front [facility]/fim 

## 2017-03-02 ENCOUNTER — Telehealth: Payer: Self-pay | Admitting: Neurology

## 2017-03-02 MED ORDER — ALPRAZOLAM 0.5 MG PO TABS: 0.5000 mg | ORAL_TABLET | Freq: Every day | ORAL | 3 refills | Status: DC | PRN

## 2017-03-02 MED ORDER — HYDROCODONE-ACETAMINOPHEN 5-325 MG PO TABS: 1.0000 | ORAL_TABLET | Freq: Three times a day (TID) | ORAL | 0 refills | Status: DC | PRN

## 2017-03-02 NOTE — Telephone Encounter (Signed)
Rx's awaiting RAS sig/fim 

## 2017-03-02 NOTE — Telephone Encounter (Signed)
Hydrocodone rx. up front [facility] and Alprazolam rx. faxed to [facility] as requested/fim

## 2017-03-02 NOTE — Addendum Note (Signed)
Addended by: France Ravens I on: 03/02/2017 11:25 AM   Modules accepted: Orders

## 2017-03-02 NOTE — Telephone Encounter (Signed)
Patient requesting refill of HYDROcodone-acetaminophen (NORCO/VICODIN) 5-325 MG tablet. She would also like ALPRAZolam (XANAX) 0.5 MG tablet called to [facility].

## 2017-04-22 ENCOUNTER — Other Ambulatory Visit: Payer: Self-pay | Admitting: Internal Medicine

## 2017-04-22 DIAGNOSIS — Z1231 Encounter for screening mammogram for malignant neoplasm of breast: Secondary | ICD-10-CM

## 2017-04-28 ENCOUNTER — Encounter: Payer: Self-pay | Admitting: Neurology

## 2017-04-28 ENCOUNTER — Ambulatory Visit (INDEPENDENT_AMBULATORY_CARE_PROVIDER_SITE_OTHER): Payer: Medicare Other | Admitting: Neurology

## 2017-04-28 VITALS — BP 114/64 | HR 78 | Resp 14 | Ht 65.5 in | Wt 203.0 lb

## 2017-04-28 DIAGNOSIS — G35 Multiple sclerosis: Secondary | ICD-10-CM | POA: Diagnosis not present

## 2017-04-28 DIAGNOSIS — R208 Other disturbances of skin sensation: Secondary | ICD-10-CM | POA: Diagnosis not present

## 2017-04-28 DIAGNOSIS — G373 Acute transverse myelitis in demyelinating disease of central nervous system: Secondary | ICD-10-CM

## 2017-04-28 DIAGNOSIS — R5383 Other fatigue: Secondary | ICD-10-CM | POA: Diagnosis not present

## 2017-04-28 DIAGNOSIS — H469 Unspecified optic neuritis: Secondary | ICD-10-CM | POA: Diagnosis not present

## 2017-04-28 MED ORDER — HYDROCODONE-ACETAMINOPHEN 5-325 MG PO TABS: 1.0000 | ORAL_TABLET | Freq: Three times a day (TID) | ORAL | 0 refills | Status: DC | PRN

## 2017-04-28 NOTE — Progress Notes (Signed)
[facility]  PATIENT: Grace [name] DOB: May 14, 1960  REFERRING DOCTOR OR PCP:  Grace [name] SOURCE: patient  _________________________________   HISTORICAL  CHIEF COMPLAINT:  Chief Complaint  Patient presents with  . Multiple Sclerosis    Remains off of dmt for MS.  Sts. fatigue is worse.  She stopped Phentermine but can't remember why. Intermittent blurred vision both eyes onset 3 days ago. Mood better since starting Lexapro. Still has trouble sleeping at night.  Usually sleeps b/t 5am-11am./fim  . FMS    HISTORY OF PRESENT ILLNESS:  Grace [name] is a 56 year old woman with multiple sclerosis.     Update 04/28/2017:   She remains on a disease modifying therapy for her MS. She has not noted any definite exacerbations recently her fatigue is worse. MRIs have shown a few predominantly nonspecific white matter foci and there has been no change from 2013-2018. Although there are only a couple nonspecific foci of the brain, she does have a couple foci within the spinal cord prompting her diagnosis.]  Her gait is stable. She stumbles some but has no falls. Strength is fine. She often notes spasticity in the legs. Baclofen and tizanidine has helped some. She also has some burning dysesthetic pain in feet, and to a lesser extent in the hands. Unfortunately, she did not get much benefit from multiple medications including nortriptyline, gabapentin, Trileptal, Lamictal, Lyrica and tramadol.    She notes blurry vision has worsened, right worse than left.  Colors are desaturated, more on the right.   Vision seems better in cold temperatures.  . Bladder function is fine.  Reports a lot of fatigue. She was on phentermine but had mild side effects and stopped.   She thinks it may have helped slightly.    Depression has done better on Lexapro. Depression worsened when her husband had a stroke in 2015.   From 11/08/2016: MS:   She denies recent exacerbation But notes more left  facial pain and some vision blurring at times..    She was on Betaseron and Copaxone in the past.   She has not wanted to try any of the oral agents or IV drugs.   Her last MRI 2/18 and was unchanged from the previous 2 MRIs (2016 2013) and just shows a few fairly nonspecific foci.  She is noting more left facial pain, right facial pain near the ear and lower back.    MRI of the brain 06/2016 showed a few stable T2 spots which is fairly non-specific and unchanged compared to her previous MRI form 2016.     Facial Pain:   She was experiencing facial pain in the left cheek that fluctuated.  We started Keppra and it helped some.  \ Lower back pain:   She sees Dr. Lynann [name] for her lower back pain and has had a bilateral L5S1 facet block.  She felt pain was better for a couple days but then pain increased when she caught her husband's fall a day later.  Pain has continued.  Gait/strength/sensation:  Gait is stable with some stumbles but no recent falls.   She notes a mild limp (but right hip has been hurting more).    Strength is fine.  She has mild spasticity, helped by baclofen and tizanidine (she alternates).     She has burning dysesthetic pain in the front of the legs and feet and in the the hands (lLeft = Right).   Nortriptyline, gabapentin and trileptal were not effective and  Lamictal caused a rash.   Lyrica was not tolerated .   Tramadol was not well tolerated.    She reports mild left leg weakness (old) but notes her gait is slightly clumsy.    Spasticity is helped with soma, tizanidine and baclofen.   Vision:  She also is noting left eye pain and blurry vision but her eye exam was normal by ophthalmology.        Bladder: She notes urinary urgency and frequency.  Fatigue/sleep:   She reports a lot of physical and cognitive fatigue. Ritalin and Provigil have not helped her any. She also has sleep onset and sleep maintenance insomnia.   She takes muscle relaxants and doxepin at bedtime. She was on CPAP  years ago but later PSG did not show OSA . Her periodic limb movements of sleep were improved with gabapentin.       Mood/cognition:  She feels her to depression has been worse. Her husband is disabled and she feels mood has been worse since his stroke. She has mild anxiety.   She was once on SSRI it is not sure if it helped her much.   She notes mild cognitive dysfunction.   She reports reduced short term memory and verbal fluency.   Ritalin did not help cognition much .     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 She had optic neuritis in 1999. In 2002, she had right-sided numbness. MRIs of the brain show just a couple of nonspecific foci. A cervical spine MRI showed a focus consistent with demyelination at C5. Additionally, there are 2 smaller foci at T2 and T3-T4.  CSF was also abnormal c/w MS.     She was initially placed on Betaseron and then on Copaxone. She was unable to tolerate either of these and has not been on any DMT medication since 2003.   She retried Betaseron but stopped after diagnosis of avascular necrosis requiring hip replacement.    Of note, she had had only one course of IV steroids prior to that and has had some afterwards.   She had not wanted to go on Tysabri or Gilenya and opted to go on no disease modifying therapy since 2012. At her last visit on 01/17/2014, she decided to go on Aubagio but never started.     Most recent MRIs are from earlier this year and showed no changes.   REVIEW OF SYSTEMS: Constitutional: No fevers, chills, sweats, or change in appetite.  Has fatigue Eyes: No visual changes, double vision, eye pain Ear, nose and throat: No hearing loss, ear pain, nasal congestion, sore throat Cardiovascular: No chest pain, palpitations Respiratory: No shortness of breath at rest or with exertion.   No wheezes GastrointestinaI: No nausea, vomiting, diarrhea, abdominal pain, fecal incontinence Genitourinary: No  dysuria, urinary retention or frequency.  No nocturia. Musculoskeletal: She reports back pain and pain and some of her joints, especially her knees and hips. Integumentary: No rash, pruritus, skin lesions Neurological: as above.  She reports restless leg, insomnia and excessive daytime sleepiness. Psychiatric: No depression at this time.  No anxiety Endocrine: No palpitations, diaphoresis, change in appetite, change in weigh.  He notes heat intolerance and excessive thirst at times. Hematologic/Lymphatic: No anemia, purpura, petechiae. Allergic/Immunologic: No itchy/runny eyes, nasal congestion, recent allergic reactions, rashes  ALLERGIES: Allergies  Allergen Reactions  . Lamictal [Lamotrigine] Shortness Of Breath, Itching and Swelling    Tongue  swells  . Mobic [Meloxicam] Anaphylaxis  . Ultram [Tramadol Hcl] Anaphylaxis  . Codeine Itching  . Latex     Latex band-aids cause redness and tears your skin  . Tape Rash    HOME MEDICATIONS:  Current Outpatient Medications:  .  ALPRAZolam (XANAX) 0.5 MG tablet, Take 1 tablet (0.5 mg total) by mouth daily as needed for anxiety., Disp: 30 tablet, Rfl: 3 .  Ascorbic Acid (VITAMIN C) 1000 MG tablet, Take 1,000 mg by mouth daily., Disp: , Rfl:  .  baclofen (LIORESAL) 10 MG tablet, Take 2 tablets (20 mg total) by mouth 3 (three) times daily., Disp: 180 each, Rfl: 11 .  carisoprodol (SOMA) 350 MG tablet, Take 1 tablet (350 mg total) by mouth 3 (three) times daily., Disp: 90 tablet, Rfl: 5 .  diphenhydrAMINE (BENADRYL) 25 MG tablet, Take 25 mg by mouth 3 (three) times daily., Disp: , Rfl:  .  doxepin (SINEQUAN) 10 MG capsule, One or two at bedtime, Disp: 60 capsule, Rfl: 11 .  ergocalciferol (VITAMIN D2) 50000 UNITS capsule, Take 50,000 Units by mouth once a week. , Disp: , Rfl:  .  escitalopram (LEXAPRO) 10 MG tablet, Take 1 tablet (10 mg total) by mouth daily., Disp: 30 tablet, Rfl: 5 .  estradiol (ESTRACE) 1 MG tablet, , Disp: , Rfl:  .   etodolac (LODINE) 500 MG tablet, 500 mg 2 (two) times daily. , Disp: , Rfl:  .  HYDROcodone-acetaminophen (NORCO/VICODIN) 5-325 MG tablet, Take 1 tablet by mouth 3 (three) times daily as needed for moderate pain., Disp: 90 tablet, Rfl: 0 .  levETIRAcetam (KEPPRA) 500 MG tablet, Take 1 tablet (500 mg total) by mouth 2 (two) times daily., Disp: 60 tablet, Rfl: 11 .  Melatonin 5 MG TABS, Take 2 tablets by mouth at bedtime., Disp: , Rfl:  .  mometasone (NASONEX) 50 MCG/ACT nasal spray, Place 2 sprays into the nose daily as needed (allergies)., Disp: , Rfl:  .  QC LORATADINE-D 10-240 MG 24 hr tablet, TAKE 1 TABLET BY MOUTH DAILY., Disp: 30 tablet, Rfl: 5 .  rizatriptan (MAXALT-MLT) 10 MG disintegrating tablet, Take 1 tablet (10 mg total) by mouth as needed for migraine. May repeat in 2 hours if needed.  Max of 2 tablets in 24 hours, and 10 tablets per month, Disp: 10 tablet, Rfl: 11 .  tiZANidine (ZANAFLEX) 2 MG tablet, Take 1 tablet (2 mg total) by mouth every 6 (six) hours as needed for muscle spasms., Disp: 120 tablet, Rfl: 11 .  Turmeric 1053 MG TABS, Take 1,053 mg by mouth daily., Disp: , Rfl:  .  vitamin E 400 UNIT capsule, Take 400 Units by mouth daily., Disp: , Rfl:  .  zolpidem (AMBIEN) 10 MG tablet, Take 10 mg by mouth at bedtime as needed. For sleep, Disp: , Rfl:  .  phentermine 30 MG capsule, Take 1 capsule (30 mg total) by mouth every morning. (Patient not taking: Reported on 04/28/2017), Disp: 30 capsule, Rfl: 5  PAST MEDICAL HISTORY: Past Medical History:  Diagnosis Date  . Avascular necrosis ([place])   . Colon polyp    pre-cancerous  . Complication of anesthesia    patient states she requires more anesthesia  . Disorder of soft tissue   . Diverticulosis   . H/O breast biopsy    pre- cancerous cells  . Hand fracture, left    9/87  . Heart palpitations   . History of Holter monitoring   . Hyperlipidemia   . Lhermitte's  syndrome   . Migraines   . Multiple sclerosis ([place])    dx  in 11/1996  . Multiple sclerosis ([place])   . Neuropathy   . Numbness and tingling in hands   . Obesity   . OSA (obstructive sleep apnea)    no longer have OSA since stopped taking a MS medication  . Otitis media   . Raynaud's phenomenon   . Vaginitis and vulvovaginitis   . Vitamin D deficiency     PAST SURGICAL HISTORY: Past Surgical History:  Procedure Laterality Date  . ABDOMINAL HYSTERECTOMY Bilateral 02/12/2016   Procedure: HYSTERECTOMY ABDOMINAL WITH BILATERAL SALPINGO OOPHERECTOMY;  Surgeon: Dian Queen, MD;  Location: [facility];  Service: Gynecology;  Laterality: Bilateral;  . BREAST BIOPSY     core/left breast  . CHOLECYSTECTOMY    . COLONOSCOPY     1997/2001  . DILATION AND CURETTAGE OF UTERUS    . ENDOMETRIAL ABLATION    . LAPAROSCOPIC ABDOMINAL EXPLORATION     ovaries and intestines bond together  . LEFT HEART CATHETERIZATION WITH CORONARY ANGIOGRAM N/A 06/21/2011   Procedure: LEFT HEART CATHETERIZATION WITH CORONARY ANGIOGRAM;  Surgeon: Leonie Man, MD;  Location: [facility] CATH [facility];  Service: Cardiovascular;  Laterality: N/A;  . LUMBAR PUNCTURE     11/01/1996  . SALPINGOOPHORECTOMY Bilateral 02/12/2016   Procedure: BILATERAL SALPINGO OOPHORECTOMY;  Surgeon: Dian Queen, MD;  Location: [facility];  Service: Gynecology;  Laterality: Bilateral;  . TOTAL HIP ARTHROPLASTY     left hip  6/02  /right hip12/04    FAMILY HISTORY: Family History  Problem Relation Age of Onset  . Pancreatic cancer Mother   . Stroke Mother   . Testicular cancer Brother   . Colon cancer Maternal Uncle   . Pancreatic cancer Paternal Uncle   . Prostate cancer Maternal Grandfather   . Colon polyps Father     SOCIAL HISTORY:  Social History   Socioeconomic History  . Marital status: Married    Spouse name: Not on file  . Number of children: Not on file  . Years of education: Not on file  . Highest education level: Not on file  Social Needs  . Financial resource strain: Not on file  .  Food insecurity - worry: Not on file  . Food insecurity - inability: Not on file  . Transportation needs - medical: Not on file  . Transportation needs - non-medical: Not on file  Occupational History  . Not on file  Tobacco Use  . Smoking status: Former Smoker    Types: Cigarettes    Last attempt to quit: 07/01/1995    Years since quitting: 21.8  . Smokeless tobacco: Never Used  Substance and Sexual Activity  . Alcohol use: No    Alcohol/week: 0.0 oz  . Drug use: No  . Sexual activity: Not on file  Other Topics Concern  . Not on file  Social History Narrative  . Not on file     PHYSICAL EXAM  Vitals:   04/28/17 1453  BP: 114/64  Pulse: 78  Resp: 14  Weight: 203 lb (92.1 kg)  Height: 5' 5.5" (1.664 m)    Body mass index is 33.27 kg/m.   General: The patient is well-developed and well-nourished and in no acute distress  Neurologic Exam  Mental status: The patient is alert and oriented x 3 at the time of the examination. The patient has apparent normal recent and remote memory, with an apparently normal attention span and concentration ability.  Speech is normal.  Cranial nerves: Extraocular movements are full.   She has a mild right APD and colors are reduced on the right. Facial strength  is normal though she feels mild reduced left facial sensation.  Trapezius and sternocleidomastoid strength is normal. No dysarthria is noted.  The tongue is midline, and the patient has symmetric elevation of the soft palate. No obvious hearing deficits are noted.  Motor:  Muscle bulk is normal.   Tone is increased in legs, left > right. Strength is  5 / 5 in all 4 extremities except 4+/5 left EHL  Sensory: Sensory testing shows reduced touch and vibration sensation on the right side..  Coordination: Cerebellar testing reveals good finger-nose-finger and mildly reduced left heel-to-shin.  Gait and station: Station is normal.   The gait is mildly wide. Tandem gait is moderately  wide.. The Romberg is negative.  Reflexes: Deep tendon reflexes are symmetric and normal bilaterally.        DIAGNOSTIC DATA (LABS, IMAGING, TESTING) - I reviewed patient records, labs, notes, testing and imaging myself where available.    ASSESSMENT AND PLAN  Multiple sclerosis (HCC)  Transverse myelitis (HCC)  Optic neuritis  Other fatigue  Dysesthesia     1.  She was diagnosed with MS based on a cervical transverse myelitis, optic neuritis and a couple foci within the brain.   MRI of the brain shows just a few stable foci.   She will remain off a disease modifying therapy.  2.  Renew hydrocodone, baclofen and tizanidine.  Phentermine for fatigue/weight loss.   Add Lexapro for mood 3.   She is noting more visual problems has asymmetry with reduced acuity and color vision on the right. I will refer her to see a neuro-ophthalmologist. 4.    Try to be more active and exercises.  Try to lose weight  She will return to see me and 5-6 months or sooner if she has new or worsening neurologic symptoms.   Sathvika Ojo A. Felecia Shelling, MD, PhD 61/95/0932, 6:71 PM Certified in Neurology, Clinical Neurophysiology, Sleep Medicine, Pain Medicine and Neuroimaging  [facility] [address] [place], [place] [address] [phone]

## 2017-05-02 ENCOUNTER — Ambulatory Visit
Admission: RE | Admit: 2017-05-02 | Discharge: 2017-05-02 | Disposition: A | Discharge status: Home / Self Care | Payer: Medicare Other | Source: Ambulatory Visit | Attending: Internal Medicine | Admitting: Internal Medicine

## 2017-05-02 DIAGNOSIS — Z1231 Encounter for screening mammogram for malignant neoplasm of breast: Secondary | ICD-10-CM

## 2017-06-03 ENCOUNTER — Telehealth: Payer: Self-pay | Admitting: Neurology

## 2017-06-03 NOTE — Telephone Encounter (Signed)
Pt is having nubness from her pinky throughout her right arm. Pt is wanting to know if she needs to come here to be seen or just talk to RN or go to PCP.

## 2017-06-03 NOTE — Telephone Encounter (Signed)
Patient called back. She states numbness started about a week ago. This is a new sx. She recalls that last Wednesday she did sit on a hard bench for a couple hours while watching grandsons. Denies any weakness. No other sx present. Advised Dr. Valrie Hart, RN out of the office today. WID will review. Dr. Felecia Shelling has no openings next week If WID feels she needs to be seen, will have to speak with Dr. Felecia Shelling Monday to see about fitting her in.

## 2017-06-03 NOTE — Telephone Encounter (Signed)
I called the patient.  The patient has multiple sclerosis, she is not on any disease modifying agents secondary to intolerance of the medication.  She has a one-week history of some numbness from the right shoulder down into the fifth finger on the right hand.  She reports no discomfort, she is not having any weakness.  She has not had any change in balance or bladder control.  There is a possibility that this may be an MS attack.  The the symptoms are not progressing, is a pure sensory event, I will not initiate steroids.  The patient may require reevaluation through Dr. Felecia Shelling to decide if another MS medication may need to be initiated.

## 2017-06-06 NOTE — Telephone Encounter (Signed)
We can try to get her in this week.... It looks like I can see her at 930 06/09/17

## 2017-06-07 NOTE — Telephone Encounter (Signed)
I left a detailed message making patient aware that I have held Dr. Garth Bigness spot on Thursday, Jan 31 at 930a (arrival of 9am), and I advised her to call me back asap to let us know if she can make this day and time.

## 2017-06-07 NOTE — Telephone Encounter (Signed)
Ok, appt removed.

## 2017-06-07 NOTE — Telephone Encounter (Signed)
Pt has called back to inform Elmyra Ricks that she has an appointment with her Orthopedic and she will just follow through with that Dr and call back to [place] if needed.  She has not requested a call back and does not wan to keep appointment for 01-31

## 2017-06-09 ENCOUNTER — Ambulatory Visit: Payer: Self-pay | Admitting: Neurology

## 2017-06-09 ENCOUNTER — Other Ambulatory Visit: Payer: Self-pay | Admitting: Neurology

## 2017-06-23 ENCOUNTER — Other Ambulatory Visit: Payer: Self-pay | Admitting: Neurology

## 2017-07-18 ENCOUNTER — Telehealth: Payer: Self-pay | Admitting: Neurology

## 2017-07-18 NOTE — Telephone Encounter (Signed)
Pt requesting a refill for HYDROcodone-acetaminophen (NORCO/VICODIN) 5-325 MG tablet, also wanting to know if we have any of the handicap forms or if should would have to got o the DMV to pick one up

## 2017-07-19 MED ORDER — HYDROCODONE-ACETAMINOPHEN 5-325 MG PO TABS: 1.0000 | ORAL_TABLET | Freq: Three times a day (TID) | ORAL | 0 refills | Status: DC | PRN

## 2017-07-19 NOTE — Addendum Note (Signed)
Addended by: France Ravens I on: 07/19/2017 09:31 AM   Modules accepted: Orders

## 2017-07-19 NOTE — Telephone Encounter (Signed)
Spoke with Grace [name] and explained I will put a handicap placard application up front [facility] with her rx., to pick up at her convenience/fim

## 2017-07-19 NOTE — Telephone Encounter (Signed)
Rx. and placard application awaiting RAS sig/fim

## 2017-07-19 NOTE — Telephone Encounter (Signed)
Rx. and handicap placard application up front [facility]/fim

## 2017-08-05 ENCOUNTER — Other Ambulatory Visit: Payer: Self-pay | Admitting: Neurology

## 2017-09-15 ENCOUNTER — Other Ambulatory Visit: Payer: Self-pay

## 2017-09-15 ENCOUNTER — Ambulatory Visit (INDEPENDENT_AMBULATORY_CARE_PROVIDER_SITE_OTHER): Payer: Medicare Other | Admitting: Neurology

## 2017-09-15 ENCOUNTER — Encounter: Payer: Self-pay | Admitting: Neurology

## 2017-09-15 ENCOUNTER — Telehealth: Payer: Self-pay | Admitting: *Deleted

## 2017-09-15 VITALS — BP 153/76 | HR 80 | Resp 18 | Ht 65.5 in | Wt 218.5 lb

## 2017-09-15 DIAGNOSIS — R5383 Other fatigue: Secondary | ICD-10-CM

## 2017-09-15 DIAGNOSIS — R208 Other disturbances of skin sensation: Secondary | ICD-10-CM | POA: Diagnosis not present

## 2017-09-15 DIAGNOSIS — R260 Ataxic gait: Secondary | ICD-10-CM | POA: Diagnosis not present

## 2017-09-15 DIAGNOSIS — Z79899 Other long term (current) drug therapy: Secondary | ICD-10-CM | POA: Diagnosis not present

## 2017-09-15 DIAGNOSIS — G373 Acute transverse myelitis in demyelinating disease of central nervous system: Secondary | ICD-10-CM | POA: Diagnosis not present

## 2017-09-15 DIAGNOSIS — G4700 Insomnia, unspecified: Secondary | ICD-10-CM | POA: Diagnosis not present

## 2017-09-15 DIAGNOSIS — G35 Multiple sclerosis: Secondary | ICD-10-CM | POA: Diagnosis not present

## 2017-09-15 MED ORDER — ALPRAZOLAM 0.5 MG PO TABS: 0.5000 mg | ORAL_TABLET | Freq: Every evening | ORAL | 5 refills | Status: DC | PRN

## 2017-09-15 MED ORDER — GABAPENTIN 300 MG PO CAPS: 300.0000 mg | ORAL_CAPSULE | Freq: Three times a day (TID) | ORAL | 11 refills | Status: DC

## 2017-09-15 NOTE — Telephone Encounter (Signed)
Received a call from Grace [name]--she sts. for the last couple of days she feels like her hands and feet are on fire, has hot flashes entire body with any type of movement. Improves if she is completely still, but as soon as she moves, sx. return.  Sts. she fell a couple of mos. ago, was seen at [facility] and sts. she had an MRI of her c-spine and was told it looked like she was having an MS "flair."  Sts. she didn't call our office at that time b/c she knew Dr. Felecia Shelling would recommend she start a dmt for her MS, and she is still reluctant to do that.  Sts. she is calling now b/c sx. have increased to the point of being unbearable.  She has a cd of the MRI done at [facility] and will bring it to our office now, and tx. can be rec. after Dr. Felecia Shelling views it/fim

## 2017-09-15 NOTE — Progress Notes (Signed)
[facility]  PATIENT: Grace [name] DOB: [birth year]  REFERRING DOCTOR OR PCP:  [name] SOURCE: patient  _________________________________   HISTORICAL  CHIEF COMPLAINT:  Chief Complaint  Patient presents with  . Multiple Sclerosis    Not on a dmt.  Sts.she fell 2 mos. ago, was seen at [facility], had an MRI and was told she has an active MS lesion. Sts. ortho gaave a steroid taper pk which helped initially, but sx. have now worsened.  Burning pain in hands/feet, feels like her whole body is on fire with any activity/fim    HISTORY OF PRESENT ILLNESS:  Eddie Koc is a 57 year old woman with multiple sclerosis.     Update 09/15/2017: In the past, she was on Betaseron and Copaxone.   She felt crazy on Copaxone and had trouble proessing.  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   She was diagnosed after ON in 1997 and had her big exacerbation in 2002 (C5).  She has had no DMT  Currently, she has numbness and clumsiness on the right.  The numbness has become very painful recently.  She feels it in both hands and feet but it is worse on the right.  When she is drowsy she gets jerks in bed.     She also notes a Lhermitte sign.   She feels her sleep is worse in general with insomnia.  I personally reviewed the MRI of the cervical spine from 06/24/2017.  It shows 2 old plaques, 1 posteriorly adjacent to C5 and another adjacent to T2.  Additionally there is a plaque adjacent to C6-C7 in the right posterior spinal cord.  There is subtle enhancement consistent with a more acute lesion.  This focus was not present on the previous MRI from 2016.   Update 04/28/2017:   She remains on a disease modifying therapy for her MS. She has not noted any definite exacerbations recently her fatigue is worse. MRIs have shown a few predominantly nonspecific white matter foci and there has been no change from  2013-2018. Although there are only a couple nonspecific foci of the brain, she does have a couple foci within the spinal cord prompting her diagnosis.]  Her gait is stable. She stumbles some but has no falls. Strength is fine. She often notes spasticity in the legs. Baclofen and tizanidine has helped some. She also has some burning dysesthetic pain in feet, and to a lesser extent in the hands. Unfortunately, she did not get much benefit from multiple medications including nortriptyline, gabapentin, Trileptal, Lamictal, Lyrica and tramadol.    She notes blurry vision has worsened, right worse than left.  Colors are desaturated, more on the right.   Vision seems better in cold temperatures.  . Bladder function is fine.  Reports a lot of fatigue. She was on phentermine but had mild side effects and stopped.   She thinks it may have helped slightly.    Depression has done better on Lexapro. Depression worsened when her husband had a stroke in 2015.   From 11/08/2016: MS:   She denies recent exacerbation But notes more left facial pain and some vision blurring at times..    She was on Betaseron and Copaxone in the past.   She has not wanted to try any of the oral agents or IV drugs.   Her last MRI 2/18 and was unchanged from the previous 2 MRIs (2016 2013)  and just shows a few fairly nonspecific foci.  She is noting more left facial pain, right facial pain near the ear and lower back.    MRI of the brain 06/2016 showed a few stable T2 spots which is fairly non-specific and unchanged compared to her previous MRI form 2016.     Facial Pain:   She was experiencing facial pain in the left cheek that fluctuated.  We started Keppra and it helped some.  \ Lower back pain:   She sees Dr. Lynann Bologna for her lower back pain and has had a bilateral L5S1 facet block.  She felt pain was better for a couple days but then pain increased when she caught her husband's fall a day later.  Pain has  continued.  Gait/strength/sensation:  Gait is stable with some stumbles but no recent falls.   She notes a mild limp (but right hip has been hurting more).    Strength is fine.  She has mild spasticity, helped by baclofen and tizanidine (she alternates).     She has burning dysesthetic pain in the front of the legs and feet and in the the hands (lLeft = Right).   Nortriptyline, gabapentin and trileptal were not effective and Lamictal caused a rash.   Lyrica was not tolerated .   Tramadol was not well tolerated.    She reports mild left leg weakness (old) but notes her gait is slightly clumsy.    Spasticity is helped with soma, tizanidine and baclofen.   Vision:  She also is noting left eye pain and blurry vision but her eye exam was normal by ophthalmology.        Bladder: She notes urinary urgency and frequency.  Fatigue/sleep:   She reports a lot of physical and cognitive fatigue. Ritalin and Provigil have not helped her any. She also has sleep onset and sleep maintenance insomnia.   She takes muscle relaxants and doxepin at bedtime. She was on CPAP years ago but later PSG did not show OSA . Her periodic limb movements of sleep were improved with gabapentin.       Mood/cognition:  She feels her to depression has been worse. Her husband is disabled and she feels mood has been worse since his stroke. She has mild anxiety.   She was once on SSRI it is not sure if it helped her much.   She notes mild cognitive dysfunction.   She reports reduced short term memory and verbal fluency.   Ritalin did not help cognition much .     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 She had optic neuritis in 1999. In 2002, she had right-sided numbness. MRIs of the brain show just a couple of nonspecific foci. A cervical spine MRI showed a focus consistent with demyelination at C5. Additionally, there are 2 smaller foci at T2 and T3-T4.  CSF was also abnormal c/w MS.      She was initially placed on Betaseron and then on Copaxone. She was unable to tolerate either of these and has not been on any DMT medication since 2003.   She retried Betaseron but stopped after diagnosis of avascular necrosis requiring hip replacement.    Of note, she had had only one course of IV steroids prior to that and has had some afterwards.   She had not wanted to go on Tysabri or Gilenya and opted to go on no disease modifying  therapy since 2012. At her last visit on 01/17/2014, she decided to go on Aubagio but never started.     Most recent MRIs are from earlier this year and showed no changes.   REVIEW OF SYSTEMS: Constitutional: No fevers, chills, sweats, or change in appetite.  Has fatigue Eyes: No visual changes, double vision, eye pain Ear, nose and throat: No hearing loss, ear pain, nasal congestion, sore throat Cardiovascular: No chest pain, palpitations Respiratory: No shortness of breath at rest or with exertion.   No wheezes GastrointestinaI: No nausea, vomiting, diarrhea, abdominal pain, fecal incontinence Genitourinary: No dysuria, urinary retention or frequency.  No nocturia. Musculoskeletal: She reports back pain and pain and some of her joints, especially her knees and hips. Integumentary: No rash, pruritus, skin lesions Neurological: as above.  She reports restless leg, insomnia and excessive daytime sleepiness. Psychiatric: No depression at this time.  No anxiety Endocrine: No palpitations, diaphoresis, change in appetite, change in weigh.  He notes heat intolerance and excessive thirst at times. Hematologic/Lymphatic: No anemia, purpura, petechiae. Allergic/Immunologic: No itchy/runny eyes, nasal congestion, recent allergic reactions, rashes  ALLERGIES: Allergies  Allergen Reactions  . Lamictal [Lamotrigine] Shortness Of Breath, Itching and Swelling    Tongue swells  . Mobic [Meloxicam] Anaphylaxis  . Ultram [Tramadol Hcl] Anaphylaxis  . Codeine Itching   . Diclofenac Itching  . Latex     Latex band-aids cause redness and tears your skin  . Tape Rash    HOME MEDICATIONS:  Current Outpatient Medications:  .  ALPRAZolam (XANAX) 0.5 MG tablet, Take 1 tablet (0.5 mg total) by mouth at bedtime as needed for anxiety., Disp: 30 tablet, Rfl: 5 .  Ascorbic Acid (VITAMIN C) 1000 MG tablet, Take 1,000 mg by mouth daily., Disp: , Rfl:  .  baclofen (LIORESAL) 10 MG tablet, Take 2 tablets (20 mg total) by mouth 3 (three) times daily., Disp: 180 each, Rfl: 11 .  diphenhydrAMINE (BENADRYL) 25 MG tablet, Take 25 mg by mouth 3 (three) times daily., Disp: , Rfl:  .  doxepin (SINEQUAN) 10 MG capsule, One or two at bedtime, Disp: 60 capsule, Rfl: 11 .  ergocalciferol (VITAMIN D2) 50000 UNITS capsule, Take 50,000 Units by mouth once a week. , Disp: , Rfl:  .  escitalopram (LEXAPRO) 10 MG tablet, TAKE 1 TABLET (10 MG TOTAL) BY MOUTH DAILY., Disp: 30 tablet, Rfl: 5 .  HYDROcodone-acetaminophen (NORCO/VICODIN) 5-325 MG tablet, Take 1 tablet by mouth 3 (three) times daily as needed for moderate pain., Disp: 90 tablet, Rfl: 0 .  levETIRAcetam (KEPPRA) 500 MG tablet, Take 1 tablet (500 mg total) by mouth 2 (two) times daily., Disp: 60 tablet, Rfl: 11 .  levocetirizine (XYZAL) 5 MG tablet, Take 5 mg by mouth every evening., Disp: , Rfl:  .  Melatonin 5 MG TABS, Take 2 tablets by mouth at bedtime., Disp: , Rfl:  .  mometasone (NASONEX) 50 MCG/ACT nasal spray, Place 2 sprays into the nose daily as needed (allergies)., Disp: , Rfl:  .  QC LORATADINE-D 10-240 MG 24 hr tablet, TAKE 1 TABLET BY MOUTH DAILY., Disp: 30 tablet, Rfl: 6 .  rizatriptan (MAXALT-MLT) 10 MG disintegrating tablet, Take 1 tablet (10 mg total) by mouth as needed for migraine. May repeat in 2 hours if needed.  Max of 2 tablets in 24 hours, and 10 tablets per month, Disp: 10 tablet, Rfl: 11 .  tiZANidine (ZANAFLEX) 2 MG tablet, Take 1 tablet (2 mg total) by mouth every 6 (six) hours as  needed for muscle  spasms., Disp: 120 tablet, Rfl: 11 .  Turmeric 1053 MG TABS, Take 1,053 mg by mouth daily., Disp: , Rfl:  .  vitamin E 400 UNIT capsule, Take 400 Units by mouth daily., Disp: , Rfl:  .  zolpidem (AMBIEN) 10 MG tablet, Take 10 mg by mouth at bedtime as needed. For sleep, Disp: , Rfl:  .  gabapentin (NEURONTIN) 300 MG capsule, Take 1 capsule (300 mg total) by mouth 3 (three) times daily., Disp: 90 capsule, Rfl: 11  PAST MEDICAL HISTORY: Past Medical History:  Diagnosis Date  . Avascular necrosis ([place])   . Colon polyp    pre-cancerous  . Complication of anesthesia    patient states she requires more anesthesia  . Disorder of soft tissue   . Diverticulosis   . H/O breast biopsy    pre- cancerous cells  . Hand fracture, left    9/87  . Heart palpitations   . History of Holter monitoring   . Hyperlipidemia   . Lhermitte's syndrome   . Migraines   . Multiple sclerosis ([place])    dx in 11/1996  . Multiple sclerosis ([place])   . Neuropathy   . Numbness and tingling in hands   . Obesity   . OSA (obstructive sleep apnea)    no longer have OSA since stopped taking a MS medication  . Otitis media   . Raynaud's phenomenon   . Vaginitis and vulvovaginitis   . Vitamin D deficiency     PAST SURGICAL HISTORY: Past Surgical History:  Procedure Laterality Date  . ABDOMINAL HYSTERECTOMY Bilateral 02/12/2016   Procedure: HYSTERECTOMY ABDOMINAL WITH BILATERAL SALPINGO OOPHERECTOMY;  Surgeon: Dian Queen, MD;  Location: [facility];  Service: Gynecology;  Laterality: Bilateral;  . BREAST BIOPSY     core/left breast  . CHOLECYSTECTOMY    . COLONOSCOPY     1997/2001  . DILATION AND CURETTAGE OF UTERUS    . ENDOMETRIAL ABLATION    . LAPAROSCOPIC ABDOMINAL EXPLORATION     ovaries and intestines bond together  . LEFT HEART CATHETERIZATION WITH CORONARY ANGIOGRAM N/A 06/21/2011   Procedure: LEFT HEART CATHETERIZATION WITH CORONARY ANGIOGRAM;  Surgeon: Leonie Man, MD;  Location: [facility];   Service: Cardiovascular;  Laterality: N/A;  . LUMBAR PUNCTURE     11/01/1996  . SALPINGOOPHORECTOMY Bilateral 02/12/2016   Procedure: BILATERAL SALPINGO OOPHORECTOMY;  Surgeon: Dian Queen, MD;  Location: [facility];  Service: Gynecology;  Laterality: Bilateral;  . TOTAL HIP ARTHROPLASTY     left hip  6/02  /right hip12/04    FAMILY HISTORY: Family History  Problem Relation Age of Onset  . Pancreatic cancer Mother   . Stroke Mother   . Testicular cancer Brother   . Colon cancer Maternal Uncle   . Pancreatic cancer Paternal Uncle   . Prostate cancer Maternal Grandfather   . Colon polyps Father     SOCIAL HISTORY:  Social History   Socioeconomic History  . Marital status: Married    Spouse name: Not on file  . Number of children: Not on file  . Years of education: Not on file  . Highest education level: Not on file  Occupational History  . Not on file  Social Needs  . Financial resource strain: Not on file  . Food insecurity:    Worry: Not on file    Inability: Not on file  . Transportation needs:    Medical: Not on file    Non-medical: Not on  file  Tobacco Use  . Smoking status: Former Smoker    Types: Cigarettes    Last attempt to quit: 07/01/1995    Years since quitting: 22.2  . Smokeless tobacco: Never Used  Substance and Sexual Activity  . Alcohol use: No    Alcohol/week: 0.0 oz  . Drug use: No  . Sexual activity: Not on file  Lifestyle  . Physical activity:    Days per week: Not on file    Minutes per session: Not on file  . Stress: Not on file  Relationships  . Social connections:    Talks on phone: Not on file    Gets together: Not on file    Attends religious service: Not on file    Active member of club or organization: Not on file    Attends meetings of clubs or organizations: Not on file    Relationship status: Not on file  . Intimate partner violence:    Fear of current or ex partner: Not on file    Emotionally abused: Not on file     Physically abused: Not on file    Forced sexual activity: Not on file  Other Topics Concern  . Not on file  Social History Narrative  . Not on file     PHYSICAL EXAM  Vitals:   09/15/17 0913  BP: (!) 153/76  Pulse: 80  Resp: 18  Weight: 218 lb 8 oz (99.1 kg)  Height: 5' 5.5" (1.664 m)    Body mass index is 35.81 kg/m.   General: The patient is well-developed and well-nourished and in no acute distress  Neurologic Exam  Mental status: The patient is alert and oriented x 3 at the time of the examination. The patient has apparent normal recent and remote memory, with an apparently normal attention span and concentration ability.   Speech is normal.  Cranial nerves: Extraocular movements are full.   She has a mild right APD and colors are reduced on the right.  Facial strength and sensation is normal.  Trapezius strength is strong.  The tongue is midline, and the patient has symmetric elevation of the soft palate. No obvious hearing deficits are noted.  Motor:  Muscle bulk is normal.   Tone is increased in legs, left > right. Strength is  5 / 5 in all 4 extremities except 4+/5 left EHL  Sensory: Sensory testing shows reduced touch and vibration sensation on the right side.. Sensation was normal on the left.  Coordination: Cerebellar testing reveals good finger-nose-finger and mildly reduced bilateral heel-to-shin  Gait and station: Station is normal.   The gait is mildly wide.  She cannot do a tandem gait. The Romberg is negative.  Reflexes: Deep tendon reflexes are symmetric and normal bilaterally.        DIAGNOSTIC DATA (LABS, IMAGING, TESTING) - I reviewed patient records, labs, notes, testing and imaging myself where available.    ASSESSMENT AND PLAN  Multiple sclerosis ([place]) - Plan: CBC with Differential/Platelet, Hepatic function panel, QuantiFERON-TB Gold Plus  Transverse myelitis ([facility])  Ataxic gait  Other fatigue  Dysesthesia  Insomnia, unspecified  type  High risk medication use - Plan: CBC with Differential/Platelet, Hepatic function panel, QuantiFERON-TB Gold Plus    1.   Mrs. Jedlicka has had a relatively less active multiple sclerosis for many years.  She had relapses in the late 90s with optic neuritis in around 2002 with numbness, ataxia and weakness but had not had any recent exacerbation despite being  off medication.  However, with her new symptoms and a verified acute spinal cord plaque, I feel she needs to get back onto a disease modifying therapy.  We had a long discussion about some of the options.  She is most interested in [place] and we went over the risks and benefits.  We will check some lab work and have her sign a service request form. 2.  Continue other medications.   Gabapentin 300 mg p.o. 3 times daily and we will titrate up if tolerated.  This should help the dysesthesias.  Additionally, the jerking when drowsy at night could be from the spinal plaque and I will have her take alprazolam at bedtime since she has tolerated that well in the past.  This should also help her insomnia. 3.   Try to be more active and exercises.  Try to lose weight 4.    She will return to see me and 4-5 months or sooner if she has new or worsening neurologic symptoms.   45 minutes face-to-face evaluation with greater than one half the time counseling and coordinating care about her recent exacerbation, other symptoms of MS and disease modifying therapy options.  Richard A. Felecia Shelling, MD, PhD 0/0/4599, 77:41 PM Certified in Neurology, Clinical Neurophysiology, Sleep Medicine, Pain Medicine and Neuroimaging  [facility] [address] [place], [place] [address] [phone]

## 2017-09-21 ENCOUNTER — Telehealth: Payer: Self-pay | Admitting: Neurology

## 2017-09-21 ENCOUNTER — Telehealth: Payer: Self-pay | Admitting: *Deleted

## 2017-09-21 LAB — CBC WITH DIFFERENTIAL/PLATELET
Basophils Absolute: 0.1 10*3/uL (ref 0.0–0.2)
Basos: 1 %
EOS (ABSOLUTE): 0.8 10*3/uL — AB (ref 0.0–0.4)
Eos: 13 %
HEMOGLOBIN: 13.5 g/dL (ref 11.1–15.9)
Hematocrit: 40.3 % (ref 34.0–46.6)
IMMATURE GRANS (ABS): 0 10*3/uL (ref 0.0–0.1)
IMMATURE GRANULOCYTES: 0 %
Lymphocytes Absolute: 2.3 10*3/uL (ref 0.7–3.1)
Lymphs: 36 %
MCH: 31.9 pg (ref 26.6–33.0)
MCHC: 33.5 g/dL (ref 31.5–35.7)
MCV: 95 fL (ref 79–97)
Monocytes Absolute: 0.5 10*3/uL (ref 0.1–0.9)
Monocytes: 7 %
NEUTROS ABS: 2.7 10*3/uL (ref 1.4–7.0)
NEUTROS PCT: 43 %
PLATELETS: 371 10*3/uL (ref 150–379)
RBC: 4.23 x10E6/uL (ref 3.77–5.28)
RDW: 14 % (ref 12.3–15.4)
WBC: 6.3 10*3/uL (ref 3.4–10.8)

## 2017-09-21 LAB — HEPATIC FUNCTION PANEL
ALK PHOS: 87 IU/L (ref 39–117)
ALT: 17 IU/L (ref 0–32)
AST: 17 IU/L (ref 0–40)
Albumin: 4.5 g/dL (ref 3.5–5.5)
BILIRUBIN TOTAL: 0.2 mg/dL (ref 0.0–1.2)
BILIRUBIN, DIRECT: 0.09 mg/dL (ref 0.00–0.40)
Total Protein: 7.1 g/dL (ref 6.0–8.5)

## 2017-09-21 LAB — QUANTIFERON-TB GOLD PLUS
QUANTIFERON NIL VALUE: 0.06 [IU]/mL
QUANTIFERON TB2 AG VALUE: 0.08 [IU]/mL
QUANTIFERON-TB GOLD PLUS: NEGATIVE
QuantiFERON Mitogen Value: >10 IU/mL
QuantiFERON TB1 Ag Value: 0.1 IU/mL

## 2017-09-21 NOTE — Telephone Encounter (Signed)
Spoke with Mickel Baas.  She had one episode of double vision bilat, no eye pain/pressure. Sx. resolved on their own w/i 8 hrs.  Should be starting Aubagio soon.  She is in the process of completing pt. assistance forms.  She will call back if sx. return/fim

## 2017-09-21 NOTE — Telephone Encounter (Signed)
Pt requesting a call back stating she is unsure if Dr. Felecia Shelling is wanting her to have an MRI of her brian done prior to her upcoming appt on 5/22 Please call to advise

## 2017-09-21 NOTE — Telephone Encounter (Signed)
Noted.  Aubagio srf has already been sent in/fim

## 2017-09-21 NOTE — Telephone Encounter (Signed)
-----   Message from Britt Bottom, MD sent at 09/21/2017  3:50 PM EDT ----- Labs are fine.   If we have not already done so, please send in the aubagio form

## 2017-09-28 ENCOUNTER — Encounter: Payer: Self-pay | Admitting: Neurology

## 2017-09-28 ENCOUNTER — Encounter: Payer: Self-pay | Admitting: *Deleted

## 2017-09-28 ENCOUNTER — Ambulatory Visit (INDEPENDENT_AMBULATORY_CARE_PROVIDER_SITE_OTHER): Payer: Medicare Other | Admitting: Neurology

## 2017-09-28 ENCOUNTER — Other Ambulatory Visit: Payer: Self-pay

## 2017-09-28 VITALS — BP 120/85 | HR 102 | Resp 18 | Ht 65.5 in | Wt 218.0 lb

## 2017-09-28 DIAGNOSIS — G373 Acute transverse myelitis in demyelinating disease of central nervous system: Secondary | ICD-10-CM

## 2017-09-28 DIAGNOSIS — G35 Multiple sclerosis: Secondary | ICD-10-CM | POA: Diagnosis not present

## 2017-09-28 DIAGNOSIS — G4700 Insomnia, unspecified: Secondary | ICD-10-CM | POA: Diagnosis not present

## 2017-09-28 DIAGNOSIS — G253 Myoclonus: Secondary | ICD-10-CM

## 2017-09-28 DIAGNOSIS — G35D Multiple sclerosis, unspecified: Secondary | ICD-10-CM

## 2017-09-28 MED ORDER — CLONAZEPAM 1 MG PO TABS: 1.0000 mg | ORAL_TABLET | Freq: Every day | ORAL | 0 refills | Status: DC

## 2017-09-28 MED ORDER — HYDROCODONE-ACETAMINOPHEN 5-325 MG PO TABS: 1.0000 | ORAL_TABLET | Freq: Three times a day (TID) | ORAL | 0 refills | Status: DC | PRN

## 2017-09-28 NOTE — Progress Notes (Signed)
[facility]  PATIENT: Grace [name] DOB: 07/07/1960  REFERRING DOCTOR OR PCP:  Grace [name] SOURCE: patient  _________________________________   HISTORICAL  CHIEF COMPLAINT:  Chief Complaint  Patient presents with  . Multiple Sclerosis    Has not started Aubagio yet due to cost. Is in the process of applying for pt. assistance.  Would like to discuss taking more steroids until she starts Aubagio.  Sts. she has seen Grace [name] about rfight eye pain and sts. was told she has a wrinkle in her eye and has been referred to a retina specialist/fim    HISTORY OF PRESENT ILLNESS:  Grace [name] is a 57 year old woman with multiple sclerosis.     Update 09/28/2017: At the last visit, after going many years without an exacerbation and being off of disease modifying therapies for long time, she had the onset of right-sided numbness and clumsiness and was found on MRI to have an acute lesion at C6-C7 (and 2 older lesions at C5 and T2) Aubagio was prescribed but she has not yet started due to insurance and co-pay issues.    She still has an intermittent Lhermitte sign and has some jerks in her limbs when drowsy.   The alprazolam did not help the jerks.      She is still noting a hot sensation all over with sweating and associated with elevated BP lasting 10-90 minutes.    These occur more at night.     She first noted this in February and when she saw Grace [name], she was prescribed a steroid pack.    A few days after the steroid, symptoms recurred.    She was having more blurry vision and saw Grace. Hassell [name] at [name].   A macular buckle was seen and she is being referred to a retina specialist.    Update 09/15/2017: In the past, she was on Betaseron and Copaxone.   She felt crazy on Copaxone and had trouble proessing.  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   She was diagnosed after ON in 1997 and had her big  exacerbation in 2002 (C5).  She has had no DMT  Currently, she has numbness and clumsiness on the right.  The numbness has become very painful recently.  She feels it in both hands and feet but it is worse on the right.  When she is drowsy she gets jerks in bed.     She also notes a Lhermitte sign.   She feels her sleep is worse in general with insomnia.  I personally reviewed the MRI of the cervical spine from 06/24/2017.  It shows 2 old plaques, 1 posteriorly adjacent to C5 and another adjacent to T2.  Additionally there is a plaque adjacent to C6-C7 in the right posterior spinal cord.  There is subtle enhancement consistent with a more acute lesion.  This focus was not present on the previous MRI from 2016.   Update 04/28/2017:   She remains on a disease modifying therapy for her MS. She has not noted any definite exacerbations recently her fatigue is worse. MRIs have shown a few predominantly nonspecific white matter foci and there has been no change from 2013-2018. Although there are only a couple nonspecific foci of the brain, she does have a couple foci within the spinal cord prompting her diagnosis.]  Her gait is stable. She stumbles some but has no falls. Strength is fine. She  often notes spasticity in the legs. Baclofen and tizanidine has helped some. She also has some burning dysesthetic pain in feet, and to a lesser extent in the hands. Unfortunately, she did not get much benefit from multiple medications including nortriptyline, gabapentin, Trileptal, Lamictal, Lyrica and tramadol.    She notes blurry vision has worsened, right worse than left.  Colors are desaturated, more on the right.   Vision seems better in cold temperatures.  . Bladder function is fine.  Reports a lot of fatigue. She was on phentermine but had mild side effects and stopped.   She thinks it may have helped slightly.    Depression has [name] better on Lexapro. Depression worsened when her husband had a stroke in  2015.   From 11/08/2016: MS:   She denies recent exacerbation But notes more left facial pain and some vision blurring at times..    She was on Betaseron and Copaxone in the past.   She has not wanted to try any of the oral agents or IV drugs.   Her last MRI 2/18 and was unchanged from the previous 2 MRIs (2016 2013) and just shows a few fairly nonspecific foci.  She is noting more left facial pain, right facial pain near the ear and lower back.    MRI of the brain 06/2016 showed a few stable T2 spots which is fairly non-specific and unchanged compared to her previous MRI form 2016.     Facial Pain:   She was experiencing facial pain in the left cheek that fluctuated.  We started Keppra and it helped some.  \ Lower back pain:   She sees Grace [name] for her lower back pain and has had a bilateral L5S1 facet block.  She felt pain was better for a couple days but then pain increased when she caught her husband's fall a day later.  Pain has continued.  Gait/strength/sensation:  Gait is stable with some stumbles but no recent falls.   She notes a mild limp (but right hip has been hurting more).    Strength is fine.  She has mild spasticity, helped by baclofen and tizanidine (she alternates).     She has burning dysesthetic pain in the front of the legs and feet and in the the hands (lLeft = Right).   Nortriptyline, gabapentin and trileptal were not effective and Lamictal caused a rash.   Lyrica was not tolerated .   Tramadol was not well tolerated.    She reports mild left leg weakness (old) but notes her gait is slightly clumsy.    Spasticity is helped with soma, tizanidine and baclofen.   Vision:  She also is noting left eye pain and blurry vision but her eye exam was normal by ophthalmology.        Bladder: She notes urinary urgency and frequency.  Fatigue/sleep:   She reports a lot of physical and cognitive fatigue. Ritalin and Provigil have not helped her any. She also has sleep onset and sleep  maintenance insomnia.   She takes muscle relaxants and doxepin at bedtime. She was on CPAP years ago but later PSG did not show OSA . Her periodic limb movements of sleep were improved with gabapentin.       Mood/cognition:  She feels her to depression has been worse. Her husband is disabled and she feels mood has been worse since his stroke. She has mild anxiety.   She was once on SSRI it is not sure if it helped  her much.   She notes mild cognitive dysfunction.   She reports reduced short term memory and verbal fluency.   Ritalin did not help cognition much .     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 She had optic neuritis in 1999. In 2002, she had right-sided numbness. MRIs of the brain show just a couple of nonspecific foci. A cervical spine MRI showed a focus consistent with demyelination at C5. Additionally, there are 2 smaller foci at T2 and T3-T4.  CSF was also abnormal c/w MS.     She was initially placed on Betaseron and then on Copaxone. She was unable to tolerate either of these and has not been on any DMT medication since 2003.   She retried Betaseron but stopped after diagnosis of avascular necrosis requiring hip replacement.    Of note, she had had only one course of IV steroids prior to that and has had some afterwards.   She had not wanted to go on Tysabri or Gilenya and opted to go on no disease modifying therapy since 2012. At her last visit on 01/17/2014, she decided to go on Aubagio but never started.     Most recent MRIs are from earlier this year and showed no changes.   REVIEW OF SYSTEMS: Constitutional: No fevers, chills, sweats, or change in appetite.  Has fatigue Eyes: No visual changes, double vision, eye pain Ear, nose and throat: No hearing loss, ear pain, nasal congestion, sore throat Cardiovascular: No chest pain, palpitations Respiratory: No shortness of breath at rest or with exertion.   No  wheezes GastrointestinaI: No nausea, vomiting, diarrhea, abdominal pain, fecal incontinence Genitourinary: No dysuria, urinary retention or frequency.  No nocturia. Musculoskeletal: She reports back pain and pain and some of her joints, especially her knees and hips. Integumentary: No rash, pruritus, skin lesions Neurological: as above.  She reports restless leg, insomnia and excessive daytime sleepiness. Psychiatric: No depression at this time.  No anxiety Endocrine: No palpitations, diaphoresis, change in appetite, change in weigh.  He notes heat intolerance and excessive thirst at times. Hematologic/Lymphatic: No anemia, purpura, petechiae. Allergic/Immunologic: No itchy/runny eyes, nasal congestion, recent allergic reactions, rashes  ALLERGIES: Allergies  Allergen Reactions  . Lamictal [Lamotrigine] Shortness Of Breath, Itching and Swelling    Tongue swells  . Mobic [Meloxicam] Anaphylaxis  . Ultram [Tramadol Hcl] Anaphylaxis  . Codeine Itching  . Diclofenac Itching  . Latex     Latex band-aids cause redness and tears your skin  . Tape Rash    HOME MEDICATIONS:  Current Outpatient Medications:  .  ALPRAZolam (XANAX) 0.5 MG tablet, Take 1 tablet (0.5 mg total) by mouth at bedtime as needed for anxiety., Disp: 30 tablet, Rfl: 5 .  Ascorbic Acid (VITAMIN C) 1000 MG tablet, Take 1,000 mg by mouth daily., Disp: , Rfl:  .  baclofen (LIORESAL) 10 MG tablet, Take 2 tablets (20 mg total) by mouth 3 (three) times daily., Disp: 180 each, Rfl: 11 .  diphenhydrAMINE (BENADRYL) 25 MG tablet, Take 25 mg by mouth 3 (three) times daily., Disp: , Rfl:  .  doxepin (SINEQUAN) 10 MG capsule, One or two at bedtime, Disp: 60 capsule, Rfl: 11 .  ergocalciferol (VITAMIN D2) 50000 UNITS capsule, Take 50,000 Units by mouth once a week. , Disp: , Rfl:  .  escitalopram (LEXAPRO) 10 MG tablet, TAKE 1 TABLET (10 MG TOTAL) BY MOUTH DAILY., Disp: 30 tablet,  Rfl: 5 .  gabapentin (NEURONTIN) 300 MG capsule,  Take 1 capsule (300 mg total) by mouth 3 (three) times daily., Disp: 90 capsule, Rfl: 11 .  HYDROcodone-acetaminophen (NORCO/VICODIN) 5-325 MG tablet, Take 1 tablet by mouth 3 (three) times daily as needed for moderate pain., Disp: 90 tablet, Rfl: 0 .  levETIRAcetam (KEPPRA) 500 MG tablet, Take 1 tablet (500 mg total) by mouth 2 (two) times daily., Disp: 60 tablet, Rfl: 11 .  levocetirizine (XYZAL) 5 MG tablet, Take 5 mg by mouth every evening., Disp: , Rfl:  .  Melatonin 5 MG TABS, Take 2 tablets by mouth at bedtime., Disp: , Rfl:  .  mometasone (NASONEX) 50 MCG/ACT nasal spray, Place 2 sprays into the nose daily as needed (allergies)., Disp: , Rfl:  .  QC LORATADINE-D 10-240 MG 24 hr tablet, TAKE 1 TABLET BY MOUTH DAILY., Disp: 30 tablet, Rfl: 6 .  rizatriptan (MAXALT-MLT) 10 MG disintegrating tablet, Take 1 tablet (10 mg total) by mouth as needed for migraine. May repeat in 2 hours if needed.  Max of 2 tablets in 24 hours, and 10 tablets per month, Disp: 10 tablet, Rfl: 11 .  tiZANidine (ZANAFLEX) 2 MG tablet, Take 1 tablet (2 mg total) by mouth every 6 (six) hours as needed for muscle spasms., Disp: 120 tablet, Rfl: 11 .  Turmeric 1053 MG TABS, Take 1,053 mg by mouth daily., Disp: , Rfl:  .  vitamin E 400 UNIT capsule, Take 400 Units by mouth daily., Disp: , Rfl:  .  zolpidem (AMBIEN) 10 MG tablet, Take 10 mg by mouth at bedtime as needed. For sleep, Disp: , Rfl:  .  clonazePAM (KLONOPIN) 1 MG tablet, Take 1 tablet (1 mg total) by mouth at bedtime., Disp: 30 tablet, Rfl: 0 .  Teriflunomide (AUBAGIO) 14 MG TABS, Take 14 mg by mouth daily., Disp: , Rfl:   PAST MEDICAL HISTORY: Past Medical History:  Diagnosis Date  . Avascular necrosis ([place])   . Colon polyp    pre-cancerous  . Complication of anesthesia    patient states she requires more anesthesia  . Disorder of soft tissue   . Diverticulosis   . H/O breast biopsy    pre- cancerous cells  . Hand fracture, left    9/87  . Heart  palpitations   . History of Holter monitoring   . Hyperlipidemia   . Lhermitte's syndrome   . Migraines   . Multiple sclerosis ([place])    dx in 11/1996  . Multiple sclerosis ([place])   . Neuropathy   . Numbness and tingling in hands   . Obesity   . OSA (obstructive sleep apnea)    no longer have OSA since stopped taking a MS medication  . Otitis media   . Raynaud's phenomenon   . Vaginitis and vulvovaginitis   . Vitamin D deficiency     PAST SURGICAL HISTORY: Past Surgical History:  Procedure Laterality Date  . ABDOMINAL HYSTERECTOMY Bilateral 02/12/2016   Procedure: HYSTERECTOMY ABDOMINAL WITH BILATERAL SALPINGO OOPHERECTOMY;  Surgeon: Dian Queen, MD;  Location: [facility];  Service: Gynecology;  Laterality: Bilateral;  . BREAST BIOPSY     core/left breast  . CHOLECYSTECTOMY    . COLONOSCOPY     1997/2001  . DILATION AND CURETTAGE OF UTERUS    . ENDOMETRIAL ABLATION    . LAPAROSCOPIC ABDOMINAL EXPLORATION     ovaries and intestines bond together  . LEFT HEART CATHETERIZATION WITH CORONARY ANGIOGRAM N/A 06/21/2011   Procedure: LEFT  HEART CATHETERIZATION WITH CORONARY ANGIOGRAM;  Surgeon: Leonie Man, MD;  Location: [name] CATH [facility];  Service: Cardiovascular;  Laterality: N/A;  . LUMBAR PUNCTURE     11/01/1996  . SALPINGOOPHORECTOMY Bilateral 02/12/2016   Procedure: BILATERAL SALPINGO OOPHORECTOMY;  Surgeon: Dian Queen, MD;  Location: [facility];  Service: Gynecology;  Laterality: Bilateral;  . TOTAL HIP ARTHROPLASTY     left hip  6/02  /right hip12/04    FAMILY HISTORY: Family History  Problem Relation Age of Onset  . Pancreatic cancer Mother   . Stroke Mother   . Testicular cancer Brother   . Colon cancer Maternal Uncle   . Pancreatic cancer Paternal Uncle   . Prostate cancer Maternal Grandfather   . Colon polyps Father     SOCIAL HISTORY:  Social History   Socioeconomic History  . Marital status: Married    Spouse name: Not on file  . Number of children: Not  on file  . Years of education: Not on file  . Highest education level: Not on file  Occupational History  . Not on file  Social Needs  . Financial resource strain: Not on file  . Food insecurity:    Worry: Not on file    Inability: Not on file  . Transportation needs:    Medical: Not on file    Non-medical: Not on file  Tobacco Use  . Smoking status: Former Smoker    Types: Cigarettes    Last attempt to quit: 07/01/1995    Years since quitting: 22.2  . Smokeless tobacco: Never Used  Substance and Sexual Activity  . Alcohol use: No    Alcohol/week: 0.0 oz  . Drug use: No  . Sexual activity: Not on file  Lifestyle  . Physical activity:    Days per week: Not on file    Minutes per session: Not on file  . Stress: Not on file  Relationships  . Social connections:    Talks on phone: Not on file    Gets together: Not on file    Attends religious service: Not on file    Active member of club or organization: Not on file    Attends meetings of clubs or organizations: Not on file    Relationship status: Not on file  . Intimate partner violence:    Fear of current or ex partner: Not on file    Emotionally abused: Not on file    Physically abused: Not on file    Forced sexual activity: Not on file  Other Topics Concern  . Not on file  Social History Narrative  . Not on file     PHYSICAL EXAM  Vitals:   09/28/17 1512  BP: 120/85  Pulse: (!) 102  Resp: 18  Weight: 218 lb (98.9 kg)  Height: 5' 5.5" (1.664 m)    Body mass index is 35.73 kg/m.   General: The patient is well-developed and well-nourished and in no acute distress  Neurologic Exam  Mental status: The patient is alert and oriented x 3 at the time of the examination. The patient has apparent normal recent and remote memory, with an apparently normal attention span and concentration ability.   Speech is normal.  Cranial nerves: Extraocular movements are full.  She has a right APD and colors are reduced  out of the right eye.  Facial strength and sensation is normal.  Trapezius strength is strong.  The tongue is midline, and the patient has symmetric elevation of the  soft palate. No obvious hearing deficits are noted.  Motor:  Muscle bulk is normal.   Muscle tone is increased in the legs, left greater than right.. Strength is  5 / 5 in all 4 extremities except 4+/5 left EHL  Sensory: Sensory testing shows reduced touch and vibration sensation on the right side.. Sensation was normal on the left.  Coordination: Cerebellar testing reveals good finger-nose-finger and mildly reduced bilateral heel-to-shin  Gait and station: Station is normal.   The gait is mildly wide.  She cannot do a tandem gait. The Romberg is negative.  Reflexes: Deep tendon reflexes are symmetric and normal bilaterally.        DIAGNOSTIC DATA (LABS, IMAGING, TESTING) - I reviewed patient records, labs, notes, testing and imaging myself where available.    ASSESSMENT AND PLAN  Multiple sclerosis (HCC)  Transverse myelitis (HCC)  Insomnia, unspecified type  Propriospinal myoclonus    1.    Start on [place] as soon as possible.  We will do 1 g of IV Solu-Medrol to see if that helps some of her symptoms.   2  .  She initially had some benefit with alprazolam for the jerking when drowsy that probably represents propriospinal myoclonus from the new MS plaque.  To try to get a longer benefit I will add clonazepam and stop the Xanax.  Continue  Gabapentin 300 mg p.o. 2-3 times daily  3.   Try to be more active and exercises.  Try to lose weight 4.    She will return to see me and 4-5 months or sooner if she has new or worsening neurologic symptoms.    Richard A. Felecia Shelling, MD, PhD 12/17/9831, 8:25 PM Certified in Neurology, Clinical Neurophysiology, Sleep Medicine, Pain Medicine and Neuroimaging  [name] [address], [place], [place] [address] [phone]

## 2017-10-04 ENCOUNTER — Telehealth: Payer: Self-pay | Admitting: *Deleted

## 2017-10-04 NOTE — Telephone Encounter (Signed)
Spoke with Danaka and explained labs were ok--she can start Aubagio when she receives it.  She verbalized understanding of same/fim

## 2017-10-04 NOTE — Telephone Encounter (Signed)
Fax received from [facility] [employer] [facility]. Pt. has been approved to receive Aubagio thru [employer]'s PAP for dates 09/30/17 thru 10/01/18./fim

## 2017-10-04 NOTE — Telephone Encounter (Signed)
Patient wants to discuss when she should start taking Aubagio.

## 2017-10-10 NOTE — Telephone Encounter (Signed)
Pt states she started the Aubagio on 5-29, Friday she started having join pain in ankles & knees and heavy feeling in her feet.  Please call

## 2017-10-10 NOTE — Telephone Encounter (Signed)
Spoke with Grace [name]. She c/o edema in bilat feet, joint pian in knees, feet on set 10/07/17. Started Aubagio 10/05/17.  Spoke with Dr. Felecia Shelling and he feels [name] is not likely causing these sx; would like her to continue Aubagio for at least several weeks.; monitor sx. as well and call back if new or worse.  She will elevate legs, alternate ice/heat if it helps, and call back if sx. do not improve/fim

## 2017-10-24 ENCOUNTER — Telehealth: Payer: Self-pay | Admitting: Neurology

## 2017-10-24 ENCOUNTER — Other Ambulatory Visit: Payer: Self-pay | Admitting: *Deleted

## 2017-10-24 DIAGNOSIS — Z79899 Other long term (current) drug therapy: Secondary | ICD-10-CM

## 2017-10-24 DIAGNOSIS — G35 Multiple sclerosis: Secondary | ICD-10-CM

## 2017-10-24 NOTE — Telephone Encounter (Signed)
LMOM that she would need to come in to have liver labs checked around June 27, 28, or July 1 or 2.  Does not need an appt.  Just tell check in she's here for labwork.  I'll put the orders in Epic/fim

## 2017-10-24 NOTE — Telephone Encounter (Signed)
Pt is wanting to know when she needs to come in for labs since starting aubagio. She is on day 20. Please call to advise

## 2017-10-31 ENCOUNTER — Telehealth: Payer: Self-pay | Admitting: Neurology

## 2017-10-31 MED ORDER — HYDROCODONE-ACETAMINOPHEN 5-325 MG PO TABS: 1.0000 | ORAL_TABLET | Freq: Three times a day (TID) | ORAL | 0 refills | Status: DC | PRN

## 2017-10-31 NOTE — Addendum Note (Signed)
Addended by: France Ravens I on: 10/31/2017 01:58 PM   Modules accepted: Orders

## 2017-10-31 NOTE — Telephone Encounter (Signed)
Rx. up front [facility]/fim 

## 2017-10-31 NOTE — Telephone Encounter (Signed)
Patient requesting refill of  HYDROcodone-acetaminophen (NORCO/VICODIN) 5-325 MG tablet and would like to pick up Rx thisThursday when she comes in for lab.

## 2017-10-31 NOTE — Telephone Encounter (Signed)
Rx. awaiting RAS sig/fim 

## 2017-11-03 ENCOUNTER — Other Ambulatory Visit (INDEPENDENT_AMBULATORY_CARE_PROVIDER_SITE_OTHER): Payer: Medicare Other

## 2017-11-03 ENCOUNTER — Other Ambulatory Visit: Payer: Self-pay | Admitting: Neurology

## 2017-11-03 ENCOUNTER — Other Ambulatory Visit: Payer: Self-pay

## 2017-11-03 DIAGNOSIS — Z0289 Encounter for other administrative examinations: Secondary | ICD-10-CM

## 2017-11-03 DIAGNOSIS — Z79899 Other long term (current) drug therapy: Secondary | ICD-10-CM

## 2017-11-03 DIAGNOSIS — G35 Multiple sclerosis: Secondary | ICD-10-CM

## 2017-11-04 ENCOUNTER — Telehealth: Payer: Self-pay | Admitting: *Deleted

## 2017-11-04 LAB — HEPATIC FUNCTION PANEL
ALT: 22 IU/L (ref 0–32)
AST: 21 IU/L (ref 0–40)
Albumin: 4.3 g/dL (ref 3.5–5.5)
Alkaline Phosphatase: 112 IU/L (ref 39–117)
BILIRUBIN TOTAL: 0.2 mg/dL (ref 0.0–1.2)
Bilirubin, Direct: 0.1 mg/dL (ref 0.00–0.40)
TOTAL PROTEIN: 6.5 g/dL (ref 6.0–8.5)

## 2017-11-04 NOTE — Telephone Encounter (Signed)
-----   Message from Britt Bottom, MD sent at 11/04/2017 10:38 AM EDT ----- Please let the patient know that the lab work is fine.

## 2017-11-04 NOTE — Telephone Encounter (Signed)
Spoke to patient - she is aware of her lab results and verbalized understanding.

## 2017-11-30 ENCOUNTER — Telehealth: Payer: Self-pay | Admitting: Neurology

## 2017-11-30 DIAGNOSIS — Z79899 Other long term (current) drug therapy: Secondary | ICD-10-CM

## 2017-11-30 DIAGNOSIS — G35 Multiple sclerosis: Secondary | ICD-10-CM

## 2017-11-30 NOTE — Addendum Note (Signed)
Addended by: France Ravens I on: 11/30/2017 03:52 PM   Modules accepted: Orders

## 2017-11-30 NOTE — Telephone Encounter (Signed)
Spoke with Grace [name] and explained lft's due to be checked sometime in the next wk. or so. (2nd lft check since starting Aubagio).  She verbalized understanding of same.  She would like to come in on 12/08/17.  Order in [place] and pt. added to lab schedule/fim

## 2017-11-30 NOTE — Telephone Encounter (Signed)
Patient wants to know when she needs to come in for lab work.

## 2017-12-08 ENCOUNTER — Telehealth: Payer: Self-pay | Admitting: Neurology

## 2017-12-08 ENCOUNTER — Other Ambulatory Visit (INDEPENDENT_AMBULATORY_CARE_PROVIDER_SITE_OTHER): Payer: Self-pay

## 2017-12-08 DIAGNOSIS — Z0289 Encounter for other administrative examinations: Secondary | ICD-10-CM

## 2017-12-08 DIAGNOSIS — Z79899 Other long term (current) drug therapy: Secondary | ICD-10-CM

## 2017-12-08 DIAGNOSIS — G35 Multiple sclerosis: Secondary | ICD-10-CM

## 2017-12-08 NOTE — Telephone Encounter (Signed)
Pt is not sleeping since starting aubagio, she is starting on 3rd pack. She has read it can be taken in the am instead of pm. Please call to advise how to change. She will be at the office today for a 4:30 lab appt.

## 2017-12-08 NOTE — Telephone Encounter (Signed)
Spoke to pt and relayed that she can take early pm 1700 and then take next day 1200 then 1000 the next day to change from pm to am to see if makes difference regarding her sleep.  Will let us know how she does when called about her lab results.

## 2017-12-09 ENCOUNTER — Telehealth: Payer: Self-pay | Admitting: *Deleted

## 2017-12-09 ENCOUNTER — Encounter: Payer: Self-pay | Admitting: Neurology

## 2017-12-09 ENCOUNTER — Other Ambulatory Visit: Payer: Self-pay | Admitting: *Deleted

## 2017-12-09 ENCOUNTER — Encounter: Payer: Self-pay | Admitting: *Deleted

## 2017-12-09 DIAGNOSIS — Z79899 Other long term (current) drug therapy: Secondary | ICD-10-CM

## 2017-12-09 DIAGNOSIS — G35 Multiple sclerosis: Secondary | ICD-10-CM

## 2017-12-09 LAB — HEPATIC FUNCTION PANEL
ALBUMIN: 4.4 g/dL (ref 3.5–5.5)
ALK PHOS: 96 IU/L (ref 39–117)
ALT: 61 IU/L — AB (ref 0–32)
AST: 70 IU/L — AB (ref 0–40)
Bilirubin Total: 0.4 mg/dL (ref 0.0–1.2)
Bilirubin, Direct: 0.13 mg/dL (ref 0.00–0.40)
Total Protein: 6.9 g/dL (ref 6.0–8.5)

## 2017-12-09 NOTE — Telephone Encounter (Signed)
Opened in error

## 2017-12-09 NOTE — Telephone Encounter (Signed)
Spoke to patient - she is aware of her lab results.  She will start taking Aubagio every other day, per Dr. Garth Bigness instructions.  She does not use alcohol.  The only source of Tylenol is her hydrocodone-apap used prn.  She will come to our office on 01/10/18 for repeat labs.  Placed on lab schedule and orders in Epic.

## 2018-01-02 ENCOUNTER — Other Ambulatory Visit: Payer: Self-pay | Admitting: Neurology

## 2018-01-06 ENCOUNTER — Telehealth: Payer: Self-pay | Admitting: Neurology

## 2018-01-06 NOTE — Telephone Encounter (Signed)
Pt asking for prescriptions for HYDROcodone-acetaminophen (NORCO/VICODIN) 5-325 MG tablet and clonazePAM (KLONOPIN) 1 MG tablet

## 2018-01-10 ENCOUNTER — Other Ambulatory Visit: Payer: Self-pay | Admitting: *Deleted

## 2018-01-10 ENCOUNTER — Other Ambulatory Visit (INDEPENDENT_AMBULATORY_CARE_PROVIDER_SITE_OTHER): Payer: Self-pay

## 2018-01-10 DIAGNOSIS — Z0289 Encounter for other administrative examinations: Secondary | ICD-10-CM

## 2018-01-10 DIAGNOSIS — Z79899 Other long term (current) drug therapy: Secondary | ICD-10-CM

## 2018-01-10 DIAGNOSIS — G35 Multiple sclerosis: Secondary | ICD-10-CM

## 2018-01-10 MED ORDER — HYDROCODONE-ACETAMINOPHEN 5-325 MG PO TABS: 1.0000 | ORAL_TABLET | Freq: Three times a day (TID) | ORAL | 0 refills | Status: DC | PRN

## 2018-01-10 MED ORDER — CLONAZEPAM 1 MG PO TABS: 1.0000 mg | ORAL_TABLET | Freq: Every day | ORAL | 0 refills | Status: DC

## 2018-01-10 NOTE — Telephone Encounter (Signed)
Rx's awaiting RAS sig/fim 

## 2018-01-10 NOTE — Addendum Note (Signed)
Addended by: France Ravens I on: 01/10/2018 09:09 AM   Modules accepted: Orders

## 2018-01-10 NOTE — Telephone Encounter (Signed)
Rx. up front [facility]/fim 

## 2018-01-11 ENCOUNTER — Telehealth: Payer: Self-pay | Admitting: *Deleted

## 2018-01-11 LAB — HEPATIC FUNCTION PANEL
ALK PHOS: 103 IU/L (ref 39–117)
ALT: 33 IU/L — ABNORMAL HIGH (ref 0–32)
AST: 45 IU/L — ABNORMAL HIGH (ref 0–40)
Albumin: 4.3 g/dL (ref 3.5–5.5)
BILIRUBIN, DIRECT: 0.1 mg/dL (ref 0.00–0.40)
Bilirubin Total: 0.4 mg/dL (ref 0.0–1.2)
TOTAL PROTEIN: 6.7 g/dL (ref 6.0–8.5)

## 2018-01-11 MED ORDER — METHYLPREDNISOLONE 4 MG PO TABS: ORAL_TABLET | ORAL | 0 refills | Status: DC

## 2018-01-11 NOTE — Telephone Encounter (Signed)
LMOM (identified vm) with below lab results.  I will give her a call next month when it is time to repeat labs. She does not need to return this call unless she has questions/fim

## 2018-01-11 NOTE — Telephone Encounter (Signed)
Spoke with Grace [name]. She sts. numbness in hands/fingers/feet/toes is worse. All joints "feel like I've got the flu but I know I don't."  Has only been on Aubagio for 2-3 mos.  Will check with RAS and call her back/fim

## 2018-01-11 NOTE — Telephone Encounter (Signed)
-----   Message from Britt Bottom, MD sent at 01/11/2018  9:49 AM EDT ----- Please let her know that the liver tests were still elevated but they look better than last month.

## 2018-01-11 NOTE — Addendum Note (Signed)
Addended by: France Ravens I on: 01/11/2018 04:02 PM   Modules accepted: Orders

## 2018-01-11 NOTE — Telephone Encounter (Signed)
Patient is returning your call and has questions.

## 2018-01-11 NOTE — Telephone Encounter (Signed)
Spoke with Margery and per RAS, advised joint pain could be due to Aubagio, but he tends to think it is not.  He can give a Medrol dose pk to see if this helps. However, if sx. are not tolerable, and she wants to switch dmt's, he will look at her chart. Will likely have to get some additional lab work. She verbalized understanding of same, and is agreeable with trying Medrol dose pk; will call back if no relief or sx. worsen.Rx. escribed to [facility] per her request/fim

## 2018-01-20 ENCOUNTER — Other Ambulatory Visit: Payer: Self-pay | Admitting: *Deleted

## 2018-01-20 DIAGNOSIS — G35 Multiple sclerosis: Secondary | ICD-10-CM

## 2018-01-20 NOTE — Progress Notes (Signed)
mri brain

## 2018-01-23 ENCOUNTER — Telehealth: Payer: Self-pay | Admitting: Neurology

## 2018-01-23 NOTE — Telephone Encounter (Signed)
[employer] order sent to [facility]. They will reach out to the pt to schedule.  °

## 2018-02-03 ENCOUNTER — Ambulatory Visit
Admission: RE | Admit: 2018-02-03 | Discharge: 2018-02-03 | Disposition: A | Discharge status: Home / Self Care | Payer: Medicare Other | Source: Ambulatory Visit | Attending: Neurology | Admitting: Neurology

## 2018-02-03 DIAGNOSIS — G35 Multiple sclerosis: Secondary | ICD-10-CM

## 2018-02-03 MED ORDER — GADOBENATE DIMEGLUMINE 529 MG/ML IV SOLN
20.0000 mL | Freq: Once | INTRAVENOUS | Status: AC | PRN
  Administered 2018-02-03: 20 mL via INTRAVENOUS

## 2018-02-06 ENCOUNTER — Telehealth: Payer: Self-pay | Admitting: *Deleted

## 2018-02-06 NOTE — Telephone Encounter (Signed)
-----   Message from Britt Bottom, MD sent at 02/05/2018  7:35 PM EDT ----- Please let the patient know that the MRI did not show any new lesions.

## 2018-02-06 NOTE — Telephone Encounter (Signed)
Spoke with Grace [name] and reviewed below MRI report.  She verbalized understanding of same./fim

## 2018-03-08 ENCOUNTER — Encounter: Payer: Self-pay | Admitting: *Deleted

## 2018-03-08 ENCOUNTER — Other Ambulatory Visit: Payer: Self-pay | Admitting: *Deleted

## 2018-03-08 MED ORDER — HYDROCODONE-ACETAMINOPHEN 5-325 MG PO TABS: 1.0000 | ORAL_TABLET | Freq: Three times a day (TID) | ORAL | 0 refills | Status: DC | PRN

## 2018-03-08 MED ORDER — RIZATRIPTAN BENZOATE 10 MG PO TBDP: 10.0000 mg | ORAL_TABLET | ORAL | 11 refills | Status: DC | PRN

## 2018-03-08 NOTE — Progress Notes (Signed)
Pt requesting refill on hydrocodone. Drug registry checked. She last refilled 01/10/18 #90. Not receiving from other MD's. Last seen 09/28/17 and next f/u 06/01/18

## 2018-03-09 ENCOUNTER — Other Ambulatory Visit: Payer: Self-pay | Admitting: Neurology

## 2018-04-12 ENCOUNTER — Ambulatory Visit: Payer: Medicare Other | Admitting: Neurology

## 2018-04-12 ENCOUNTER — Other Ambulatory Visit: Payer: Self-pay | Admitting: Internal Medicine

## 2018-04-12 ENCOUNTER — Encounter: Payer: Self-pay | Admitting: Gastroenterology

## 2018-04-12 DIAGNOSIS — R5381 Other malaise: Secondary | ICD-10-CM

## 2018-04-27 ENCOUNTER — Ambulatory Visit (AMBULATORY_SURGERY_CENTER): Payer: Self-pay

## 2018-04-27 ENCOUNTER — Ambulatory Visit (INDEPENDENT_AMBULATORY_CARE_PROVIDER_SITE_OTHER): Payer: Medicare Other

## 2018-04-27 ENCOUNTER — Encounter: Payer: Self-pay | Admitting: Gastroenterology

## 2018-04-27 ENCOUNTER — Ambulatory Visit (INDEPENDENT_AMBULATORY_CARE_PROVIDER_SITE_OTHER): Payer: Medicare Other | Admitting: Internal Medicine

## 2018-04-27 VITALS — BP 142/82 | HR 67 | Temp 97.8°F | Ht 67.0 in | Wt 227.0 lb

## 2018-04-27 VITALS — Ht 66.0 in | Wt 224.2 lb

## 2018-04-27 DIAGNOSIS — R030 Elevated blood-pressure reading, without diagnosis of hypertension: Secondary | ICD-10-CM

## 2018-04-27 DIAGNOSIS — Z0000 Encounter for general adult medical examination without abnormal findings: Secondary | ICD-10-CM | POA: Diagnosis not present

## 2018-04-27 DIAGNOSIS — R1084 Generalized abdominal pain: Secondary | ICD-10-CM | POA: Diagnosis not present

## 2018-04-27 DIAGNOSIS — N6320 Unspecified lump in the left breast, unspecified quadrant: Secondary | ICD-10-CM

## 2018-04-27 DIAGNOSIS — R1010 Upper abdominal pain, unspecified: Secondary | ICD-10-CM

## 2018-04-27 DIAGNOSIS — N6325 Unspecified lump in the left breast, overlapping quadrants: Secondary | ICD-10-CM

## 2018-04-27 DIAGNOSIS — Z86010 Personal history of colonic polyps: Secondary | ICD-10-CM

## 2018-04-27 MED ORDER — LISINOPRIL 2.5 MG PO TABS: ORAL_TABLET | ORAL | 0 refills | Status: DC

## 2018-04-27 MED ORDER — NA SULFATE-K SULFATE-MG SULF 17.5-3.13-1.6 GM/177ML PO SOLN: 1.0000 | Freq: Once | ORAL | 0 refills | Status: AC

## 2018-04-27 NOTE — Patient Instructions (Signed)
Grace [name] , Thank you for taking time to come for your Medicare Wellness Visit. I appreciate your ongoing commitment to your health goals. Please review the following plan we discussed and let me know if I can assist you in the future.   Screening recommendations/referrals: Colonoscopy: appointment for January Mammogram: getting scheduled Bone Density: 02/2013 Recommended yearly ophthalmology/optometry visit for glaucoma screening and checkup Recommended yearly dental visit for hygiene and checkup  Vaccinations: Influenza vaccine: declined Pneumococcal vaccine: not yet Tdap vaccine: 01/2009 Shingles vaccine:  declined  Advanced directives: Advance directive discussed with you today. I have provided a copy for you to complete at home and have notarized. Once this is complete please bring a copy in to our office so we can scan it into your chart.   Conditions/risks identified: obesity  Next appointment:   Preventive Care 40-64 Years, Female Preventive care refers to lifestyle choices and visits with your health care provider that can promote health and wellness. What does preventive care include?  A yearly physical exam. This is also called an annual well check.  Dental exams once or twice a year.  Routine eye exams. Ask your health care provider how often you should have your eyes checked.  Personal lifestyle choices, including:  Daily care of your teeth and gums.  Regular physical activity.  Eating a healthy diet.  Avoiding tobacco and drug use.  Limiting alcohol use.  Practicing safe sex.  Taking low-dose aspirin daily starting at age 4.  Taking vitamin and mineral supplements as recommended by your health care provider. What happens during an annual well check? The services and screenings done by your health care provider during your annual well check will depend on your age, overall health, lifestyle risk factors, and family history of disease. Counseling    Your health care provider may ask you questions about your:  Alcohol use.  Tobacco use.  Drug use.  Emotional well-being.  Home and relationship well-being.  Sexual activity.  Eating habits.  Work and work Statistician.  Method of birth control.  Menstrual cycle.  Pregnancy history. Screening  You may have the following tests or measurements:  Height, weight, and BMI.  Blood pressure.  Lipid and cholesterol levels. These may be checked every 5 years, or more frequently if you are over 98 years old.  Skin check.  Lung cancer screening. You may have this screening every year starting at age 78 if you have a 30-pack-year history of smoking and currently smoke or have quit within the past 15 years.  Fecal occult blood test (FOBT) of the stool. You may have this test every year starting at age 58.  Flexible sigmoidoscopy or colonoscopy. You may have a sigmoidoscopy every 5 years or a colonoscopy every 10 years starting at age 33.  Hepatitis C blood test.  Hepatitis B blood test.  Sexually transmitted disease (STD) testing.  Diabetes screening. This is done by checking your blood sugar (glucose) after you have not eaten for a while (fasting). You may have this done every 1-3 years.  Mammogram. This may be done every 1-2 years. Talk to your health care provider about when you should start having regular mammograms. This may depend on whether you have a family history of breast cancer.  BRCA-related cancer screening. This may be done if you have a family history of breast, ovarian, tubal, or peritoneal cancers.  Pelvic exam and Pap test. This may be done every 3 years starting at age 39. Starting at age  30, this may be done every 5 years if you have a Pap test in combination with an HPV test.  Bone density scan. This is done to screen for osteoporosis. You may have this scan if you are at high risk for osteoporosis. Discuss your test results, treatment options, and if  necessary, the need for more tests with your health care provider. Vaccines  Your health care provider may recommend certain vaccines, such as:  Influenza vaccine. This is recommended every year.  Tetanus, diphtheria, and acellular pertussis (Tdap, Td) vaccine. You may need a Td booster every 10 years.  Zoster vaccine. You may need this after age 70.  Pneumococcal 13-valent conjugate (PCV13) vaccine. You may need this if you have certain conditions and were not previously vaccinated.  Pneumococcal polysaccharide (PPSV23) vaccine. You may need one or two doses if you smoke cigarettes or if you have certain conditions. Talk to your health care provider about which screenings and vaccines you need and how often you need them. This information is not intended to replace advice given to you by your health care provider. Make sure you discuss any questions you have with your health care provider. Document Released: 05/23/2015 Document Revised: 01/14/2016 Document Reviewed: 02/25/2015 Elsevier Interactive Patient Education  2017 [place] Prevention in the Home Falls can cause injuries. They can happen to people of all ages. There are many things you can do to make your home safe and to help prevent falls. What can I do on the outside of my home?  Regularly fix the edges of walkways and driveways and fix any cracks.  Remove anything that might make you trip as you walk through a door, such as a raised step or threshold.  Trim any bushes or trees on the path to your home.  Use bright outdoor lighting.  Clear any walking paths of anything that might make someone trip, such as rocks or tools.  Regularly check to see if handrails are loose or broken. Make sure that both sides of any steps have handrails.  Any raised decks and porches should have guardrails on the edges.  Have any leaves, snow, or ice cleared regularly.  Use sand or salt on walking paths during  winter.  Clean up any spills in your garage right away. This includes oil or grease spills. What can I do in the bathroom?  Use night lights.  Install grab bars by the toilet and in the tub and shower. Do not use towel bars as grab bars.  Use non-skid mats or decals in the tub or shower.  If you need to sit down in the shower, use a plastic, non-slip stool.  Keep the floor dry. Clean up any water that spills on the floor as soon as it happens.  Remove soap buildup in the tub or shower regularly.  Attach bath mats securely with double-sided non-slip rug tape.  Do not have throw rugs and other things on the floor that can make you trip. What can I do in the bedroom?  Use night lights.  Make sure that you have a light by your bed that is easy to reach.  Do not use any sheets or blankets that are too big for your bed. They should not hang down onto the floor.  Have a firm chair that has side arms. You can use this for support while you get dressed.  Do not have throw rugs and other things on the floor that can  make you trip. What can I do in the kitchen?  Clean up any spills right away.  Avoid walking on wet floors.  Keep items that you use a lot in easy-to-reach places.  If you need to reach something above you, use a strong step stool that has a grab bar.  Keep electrical cords out of the way.  Do not use floor polish or wax that makes floors slippery. If you must use wax, use non-skid floor wax.  Do not have throw rugs and other things on the floor that can make you trip. What can I do with my stairs?  Do not leave any items on the stairs.  Make sure that there are handrails on both sides of the stairs and use them. Fix handrails that are broken or loose. Make sure that handrails are as long as the stairways.  Check any carpeting to make sure that it is firmly attached to the stairs. Fix any carpet that is loose or worn.  Avoid having throw rugs at the top or  bottom of the stairs. If you do have throw rugs, attach them to the floor with carpet tape.  Make sure that you have a light switch at the top of the stairs and the bottom of the stairs. If you do not have them, ask someone to add them for you. What else can I do to help prevent falls?  Wear shoes that:  Do not have high heels.  Have rubber bottoms.  Are comfortable and fit you well.  Are closed at the toe. Do not wear sandals.  If you use a stepladder:  Make sure that it is fully opened. Do not climb a closed stepladder.  Make sure that both sides of the stepladder are locked into place.  Ask someone to hold it for you, if possible.  Clearly mark and make sure that you can see:  Any grab bars or handrails.  First and last steps.  Where the edge of each step is.  Use tools that help you move around (mobility aids) if they are needed. These include:  Canes.  Walkers.  Scooters.  Crutches.  Turn on the lights when you go into a dark area. Replace any light bulbs as soon as they burn out.  Set up your furniture so you have a clear path. Avoid moving your furniture around.  If any of your floors are uneven, fix them.  If there are any pets around you, be aware of where they are.  Review your medicines with your doctor. Some medicines can make you feel dizzy. This can increase your chance of falling. Ask your doctor what other things that you can do to help prevent falls. This information is not intended to replace advice given to you by your health care provider. Make sure you discuss any questions you have with your health care provider. Document Released: 02/20/2009 Document Revised: 10/02/2015 Document Reviewed: 05/31/2014 Elsevier Interactive Patient Education  2017 [employer].   Exercising to Stay Healthy To become healthy and stay healthy, it is recommended that you do moderate-intensity and vigorous-intensity exercise. You can tell that you are  exercising at a moderate intensity if your heart starts beating faster and you start breathing faster but can still hold a conversation. You can tell that you are exercising at a vigorous intensity if you are breathing much harder and faster and cannot hold a conversation while exercising. Exercising regularly is important. It has many health benefits, such as:  Improving overall fitness, flexibility, and endurance.  Increasing bone density.  Helping with weight control.  Decreasing body fat.  Increasing muscle strength.  Reducing stress and tension.  Improving overall health. How often should I exercise? Choose an activity that you enjoy, and set realistic goals. Your health care provider can help you make an activity plan that works for you. Exercise regularly as told by your health care provider. This may include:  Doing strength training two times a week, such as: ? Lifting weights. ? Using resistance bands. ? Push-ups. ? Sit-ups. ? Yoga.  Doing a certain intensity of exercise for a given amount of time. Choose from these options: ? A total of 150 minutes of moderate-intensity exercise every week. ? A total of 75 minutes of vigorous-intensity exercise every week. ? A mix of moderate-intensity and vigorous-intensity exercise every week. Children, pregnant women, people who have not exercised regularly, people who are overweight, and older adults may need to talk with a health care provider about what activities are safe to do. If you have a medical condition, be sure to talk with your health care provider before you start a new exercise program. What are some exercise ideas? Moderate-intensity exercise ideas include:  Walking 1 mile (1.6 km) in about 15 minutes.  Biking.  Hiking.  Golfing.  Dancing.  Water aerobics. Vigorous-intensity exercise ideas include:  Walking 4.5 miles (7.2 km) or more in about 1 hour.  Jogging or running 5 miles (8 km) in about 1  hour.  Biking 10 miles (16.1 km) or more in about 1 hour.  Lap swimming.  Roller-skating or in-line skating.  Cross-country skiing.  Vigorous competitive sports, such as football, basketball, and soccer.  Jumping rope.  Aerobic dancing. What are some everyday activities that can help me to get exercise?  [place] work, such as: ? Pushing a Conservation officer, nature. ? Raking and bagging leaves.  Washing your car.  Pushing a stroller.  Shoveling snow.  Gardening.  Washing windows or floors. How can I be more active in my day-to-day activities?  Use stairs instead of an elevator.  Take a walk during your lunch break.  If you drive, park your car farther away from your work or school.  If you take public transportation, get off one stop early and walk the rest of the way.  Stand up or walk around during all of your indoor phone calls.  Get up, stretch, and walk around every 30 minutes throughout the day.  Enjoy exercise with a friend. Support to continue exercising will help you keep a regular routine of activity. What guidelines can I follow while exercising?  Before you start a new exercise program, talk with your health care provider.  Do not exercise so much that you hurt yourself, feel dizzy, or get very short of breath.  Wear comfortable clothes and wear shoes with good support.  Drink plenty of water while you exercise to prevent dehydration or heat stroke.  Work out until your breathing and your heartbeat get faster. Where to find more information  [place].S[place] Department of Health and Human Services: [employer].at  Centers for Disease Control and Prevention (CDC): http://www.wolf.info/ Summary  Exercising regularly is important. It will improve your overall fitness, flexibility, and endurance.  Regular exercise also will improve your overall health. It can help you control your weight, reduce stress, and improve your bone density.  Do not exercise so much that you hurt yourself,  feel dizzy, or get very short of breath.  Before you start a new exercise program, talk with your health care provider. This information is not intended to replace advice given to you by your health care provider. Make sure you discuss any questions you have with your health care provider. Document Released: 05/29/2010 Document Revised: 03/17/2017 Document Reviewed: 03/17/2017 Elsevier Interactive Patient Education  2019 [employer].

## 2018-04-27 NOTE — Progress Notes (Signed)
Denies allergies to eggs or soy products. Denies complication of anesthesia or sedation. Denies use of weight loss medication. Denies use of O2.   Emmi instructions declined.  

## 2018-04-27 NOTE — Progress Notes (Signed)
Subjective:     Patient ID: Grace [name] , female    DOB: 1960-11-14 , 57 y.o.   MRN: [id number]   CC- L breast lump and chest pain  HPI  1-Had chest pain upper central chest pain which she described as sharp and heavy off and on for a few seconds. Other times feels " sharp twinge of pain on R shoulder" Been under a lot of stress. Her BP has been high upper 140'- 150 / 97.  2- She Found a L lateral breast lump 11/27 and tried to get a mammogram, but was told we need to change the screen mammogram to diagnostic mammogram.  3- Gets intermittent lower abdominal pain off and on for 2-3 weeks. Cant tell what provokes the pain or makes it better. No N/V.   Past Medical History:  Diagnosis Date  . Avascular necrosis ([place])   . Colon polyp    pre-cancerous  . Complication of anesthesia    patient states she requires more anesthesia  . Disorder of soft tissue   . Diverticulosis   . H/O breast biopsy    pre- cancerous cells  . Hand fracture, left    9/87  . Heart palpitations   . History of Holter monitoring   . Hyperlipidemia   . Lhermitte's syndrome   . Migraines   . Multiple sclerosis ([place])    dx in 11/1996  . Multiple sclerosis ([place])   . Neuropathy   . Numbness and tingling in hands   . Obesity   . OSA (obstructive sleep apnea)    no longer have OSA since stopped taking a MS medication  . Otitis media   . Raynaud's phenomenon   . Vaginitis and vulvovaginitis   . Vitamin D deficiency      Family History  Problem Relation Age of Onset  . Pancreatic cancer Mother   . Stroke Mother   . Testicular cancer Brother   . Colon cancer Maternal Uncle   . Pancreatic cancer Paternal Uncle   . Prostate cancer Maternal Grandfather   . Colon polyps Father      Current Outpatient Medications:  .  amoxicillin (AMOXIL) 500 MG tablet, Take 500 mg by mouth. 4 tablets prior to dental procedures, Disp: , Rfl:  .  Ascorbic Acid (VITAMIN C) 1000 MG tablet, Take 1,000 mg by mouth daily.,  Disp: , Rfl:  .  aspirin EC 81 MG tablet, Take 81 mg by mouth daily., Disp: , Rfl:  .  baclofen (LIORESAL) 10 MG tablet, TAKE 2 TABLETS (20 MG TOTAL) BY MOUTH 3 (THREE) TIMES DAILY., Disp: 180 tablet, Rfl: 11 .  Biotin 1 MG CAPS, Take by mouth., Disp: , Rfl:  .  calcium-vitamin D (OSCAL WITH D) 500-200 MG-UNIT tablet, Take 1 tablet by mouth., Disp: , Rfl:  .  clonazePAM (KLONOPIN) 1 MG tablet, Take 1 tablet (1 mg total) by mouth at bedtime., Disp: 30 tablet, Rfl: 0 .  diphenhydrAMINE (BENADRYL) 25 MG tablet, Take 25 mg by mouth 3 (three) times daily., Disp: , Rfl:  .  doxepin (SINEQUAN) 10 MG capsule, TAKE 1 OR 2 TABLETS BY MOUTH AT BEDTIME, Disp: 60 capsule, Rfl: 11 .  Echinacea 500 MG CAPS, Take by mouth., Disp: , Rfl:  .  ergocalciferol (VITAMIN D2) 50000 UNITS capsule, Take 50,000 Units by mouth once a week. , Disp: , Rfl:  .  escitalopram (LEXAPRO) 10 MG tablet, TAKE 1 TABLET (10 MG TOTAL) BY MOUTH DAILY. (Patient not taking: Reported  on 04/27/2018), Disp: 30 tablet, Rfl: 5 .  etodolac (LODINE) 500 MG tablet, Take 500 mg by mouth. 1-2 times a day, Disp: , Rfl:  .  gabapentin (NEURONTIN) 300 MG capsule, Take 1 capsule (300 mg total) by mouth 3 (three) times daily., Disp: 90 capsule, Rfl: 11 .  Grape Seed 100 MG CAPS, Take by mouth., Disp: , Rfl:  .  HYDROcodone-acetaminophen (NORCO/VICODIN) 5-325 MG tablet, Take 1 tablet by mouth 3 (three) times daily as needed for moderate pain., Disp: 90 tablet, Rfl: 0 .  Lactobacillus (PROBIOTIC ACIDOPHILUS PO), Take by mouth., Disp: , Rfl:  .  levETIRAcetam (KEPPRA) 500 MG tablet, Take 1 tablet (500 mg total) by mouth 2 (two) times daily. (Patient not taking: Reported on 04/27/2018), Disp: 60 tablet, Rfl: 11 .  levocetirizine (XYZAL) 5 MG tablet, Take 5 mg by mouth every evening., Disp: , Rfl:  .  Melatonin 5 MG TABS, Take 2 tablets by mouth at bedtime., Disp: , Rfl:  .  methylPREDNISolone (MEDROL) 4 MG tablet, Take 6 tablets on day 1 and decrease by 1  tablet daily until gone. (Patient not taking: Reported on 04/27/2018), Disp: 21 tablet, Rfl: 0 .  mometasone (NASONEX) 50 MCG/ACT nasal spray, Place 2 sprays into the nose daily as needed (allergies)., Disp: , Rfl:  .  QC LORATADINE-D 10-240 MG 24 hr tablet, TAKE 1 TABLET BY MOUTH DAILY., Disp: 30 tablet, Rfl: 6 .  rizatriptan (MAXALT-MLT) 10 MG disintegrating tablet, Take 1 tablet (10 mg total) by mouth as needed for migraine. May repeat in 2 hours if needed.  Max of 2 tablets in 24 hours, and 10 tablets per month, Disp: 10 tablet, Rfl: 11 .  tiZANidine (ZANAFLEX) 2 MG tablet, TAKE 1 TABLET (2 MG TOTAL) BY MOUTH EVERY 6 (SIX) HOURS AS NEEDED FOR MUSCLE SPASMS., Disp: 120 tablet, Rfl: 11 .  Turmeric 1053 MG TABS, Take 1,053 mg by mouth daily., Disp: , Rfl:  .  vitamin E 400 UNIT capsule, Take 400 Units by mouth daily., Disp: , Rfl:  .  zolpidem (AMBIEN) 10 MG tablet, Take 10 mg by mouth at bedtime as needed. For sleep, Disp: , Rfl:    Allergies  Allergen Reactions  . Lamictal [Lamotrigine] Shortness Of Breath, Itching and Swelling    Tongue swells  . Mobic [Meloxicam] Anaphylaxis  . Ultram [Tramadol Hcl] Anaphylaxis  . Codeine Itching  . Diclofenac Itching  . Latex     Latex band-aids cause redness and tears your skin  . Teriflunomide Diarrhea    Hair loss, joint pains, elevated liver enzymes  . Tape Rash     Review of Systems  Constitutional: Positive for fatigue. Negative for appetite change, chills, diaphoresis and fever.  HENT: Negative.   Respiratory: Negative for chest tightness and shortness of breath.   Cardiovascular: Positive for chest pain and palpitations. Negative for leg swelling.  Gastrointestinal: Positive for abdominal pain. Negative for constipation, diarrhea, nausea and vomiting.  Endocrine: Negative for polydipsia and polyphagia.  Genitourinary: Positive for pelvic pain. Negative for difficulty urinating, dysuria, frequency, urgency and vaginal pain.   Musculoskeletal: Positive for arthralgias and myalgias.  Skin: Negative for rash.  Neurological: Negative for facial asymmetry, speech difficulty, weakness, light-headedness and headaches.  Hematological: Negative for adenopathy.  Psychiatric/Behavioral: Positive for sleep disturbance.       Wakes up every 2 hours. If she takes the Ambien, the next day she cant fall asleep.     Today's Vitals   04/27/18 1112  BP: [name] Kitchen)  142/82  Pulse: 67  Temp: 97.8 F (36.6 C)  TempSrc: Oral  Weight: 227 lb (103 kg)  Height: 5\' 7"  (1.702 m)   Body mass index is 35.55 kg/m.   Objective:  Physical Exam Vitals signs and nursing note reviewed.  Constitutional:      General: She is not in acute distress.    Appearance: She is obese.     Comments: Her face is flushed  HENT:     Head: Normocephalic.     Right Ear: External ear normal.     Left Ear: External ear normal.     Nose: Nose normal.  Eyes:     General: No scleral icterus.    Conjunctiva/sclera: Conjunctivae normal.  Neck:     Musculoskeletal: Neck supple.     Vascular: No carotid bruit.  Cardiovascular:     Rate and Rhythm: Normal rate and regular rhythm.     Heart sounds: No murmur.  Pulmonary:     Effort: Pulmonary effort is normal. No respiratory distress.     Breath sounds: No wheezing or rales.  Abdominal:     General: Bowel sounds are normal. There is no distension.     Palpations: Abdomen is soft. There is no mass.     Tenderness: There is abdominal tenderness. There is no guarding or rebound.     Hernia: No hernia is present.     Comments: Has generalized abdominal pain, but seem more tender on upper region bilaterally  Genitourinary:    Comments: R breast xm is normal.  L breast reveals around 2 x 2 cm lump on L upper chest wall/breast region around 12 o'clock and another on 3 at o'clock. They are non tender.  Musculoskeletal: Normal range of motion.  Skin:    General: Skin is warm and dry.  Neurological:     Mental  Status: She is alert and oriented to person, place, and time.  Psychiatric:        Mood and Affect: Mood normal.        Behavior: Behavior normal.        Thought Content: Thought content normal.   EKG- normal Assessment And Plan:  1. Breast lump on left side at 3 o'clock position- new - [place] Unlisted Procedure Breast; Future - MM Digital Screening Unilat L; Future  2. Breast lump on left side at 12 o'clock position- new - [place] Unlisted Procedure Breast; Future  3. Elevated blood pressure reading- new. Could be due to stress, but as I looked back in her records she has had elevation in her BP. I went ahead and placed her on low dose of lisinopril 2.5 mg and was asked to monitor her BP an call us if BP stays 140/90 or more. - CMP14 + Anion Gap - TSH - T3, free - T4, Free - EKG 12-Lead- normal  4. Pain of upper abdomen- acute. Abdominal ultrasound ordered.   5. Generalized abdominal pain- acute  - US Abdomen Complete; Future      Grace Klarich RODRIGUEZ-SOUTHWORTH, PA-C

## 2018-04-27 NOTE — Progress Notes (Addendum)
Subjective:   Grace [name] is a 57 y.o. female who presents for Medicare Annual (Subsequent) preventive examination.  Review of Systems:  n/a Cardiac Risk Factors include: obesity (BMI >30kg/m2);sedentary lifestyle     Objective:     Vitals: BP (!) 142/82 (BP Location: Left Arm, Patient Position: Sitting)   Pulse 67   Temp 97.8 F (36.6 C) (Oral)   Ht 5\' 7"  (1.702 m)   Wt 227 lb (103 kg)   LMP 04/09/2013   BMI 35.55 kg/m   Body mass index is 35.55 kg/m.  Advanced Directives 04/27/2018 06/30/2016 02/12/2016 02/04/2016 06/21/2011  Does Patient Have a Medical Advance Directive? No No No No Patient does not have advance directive  Would patient like information on creating a medical advance directive? Yes (MAU/Ambulatory/Procedural Areas - Information given) Yes (MAU/Ambulatory/Procedural Areas - Information given) No - patient declined information Yes - Educational materials given -    Tobacco Social History   Tobacco Use  Smoking Status Former Smoker  . Types: Cigarettes  . Last attempt to quit: 07/01/1995  . Years since quitting: 22.8  Smokeless Tobacco Never Used     Counseling given: Not Answered   Clinical Intake:  Pre-visit preparation completed: Yes  Pain : 0-10 Pain Score: 5  Pain Type: Acute pain Pain Location: Back(neck pain) Pain Orientation: Lower Pain Radiating Towards: down left leg Pain Descriptors / Indicators: Burning, Aching Pain Onset: 1 to 4 weeks ago Pain Frequency: Intermittent Pain Relieving Factors: prednisone Effect of Pain on Daily Activities: slowed down  Pain Relieving Factors: prednisone  Nutritional Status: BMI > 30  Obese Nutritional Risks: None Diabetes: No  How often do you need to have someone help you when you read instructions, pamphlets, or other written materials from your doctor or pharmacy?: 1 - Never What is the last grade level you completed in school?: Technical school  Interpreter Needed?: No  Information  entered by :: NAllen LPN  Past Medical History:  Diagnosis Date  . Avascular necrosis ([place])   . Colon polyp    pre-cancerous  . Complication of anesthesia    patient states she requires more anesthesia  . Disorder of soft tissue   . Diverticulosis   . H/O breast biopsy    pre- cancerous cells  . Hand fracture, left    9/87  . Heart palpitations   . History of Holter monitoring   . Hyperlipidemia   . Lhermitte's syndrome   . Migraines   . Multiple sclerosis ([place])    dx in 11/1996  . Multiple sclerosis ([place])   . Neuropathy   . Numbness and tingling in hands   . Obesity   . OSA (obstructive sleep apnea)    no longer have OSA since stopped taking a MS medication  . Otitis media   . Raynaud's phenomenon   . Vaginitis and vulvovaginitis   . Vitamin D deficiency    Past Surgical History:  Procedure Laterality Date  . ABDOMINAL HYSTERECTOMY Bilateral 02/12/2016   Procedure: HYSTERECTOMY ABDOMINAL WITH BILATERAL SALPINGO OOPHERECTOMY;  Surgeon: Dian Queen, MD;  Location: [facility];  Service: Gynecology;  Laterality: Bilateral;  . BREAST BIOPSY     core/left breast  . CHOLECYSTECTOMY    . COLONOSCOPY     1997/2001  . DILATION AND CURETTAGE OF UTERUS    . ENDOMETRIAL ABLATION    . LAPAROSCOPIC ABDOMINAL EXPLORATION     ovaries and intestines bond together  . LEFT HEART CATHETERIZATION WITH CORONARY ANGIOGRAM N/A 06/21/2011  Procedure: LEFT HEART CATHETERIZATION WITH CORONARY ANGIOGRAM;  Surgeon: Leonie Man, MD;  Location: [facility] CATH LAB;  Service: Cardiovascular;  Laterality: N/A;  . LUMBAR PUNCTURE     11/01/1996  . SALPINGOOPHORECTOMY Bilateral 02/12/2016   Procedure: BILATERAL SALPINGO OOPHORECTOMY;  Surgeon: Dian Queen, MD;  Location: [facility];  Service: Gynecology;  Laterality: Bilateral;  . TOTAL HIP ARTHROPLASTY     left hip  6/02  /right hip12/04   Family History  Problem Relation Age of Onset  . Pancreatic cancer Mother   . Stroke Mother   . Testicular  cancer Brother   . Colon cancer Maternal Uncle   . Pancreatic cancer Paternal Uncle   . Prostate cancer Maternal Grandfather   . Colon polyps Father    Social History   Socioeconomic History  . Marital status: Married    Spouse name: Not on file  . Number of children: Not on file  . Years of education: Not on file  . Highest education level: Not on file  Occupational History  . Occupation: disabled  Social Needs  . Financial resource strain: Not hard at all  . Food insecurity:    Worry: Never true    Inability: Never true  . Transportation needs:    Medical: No    Non-medical: No  Tobacco Use  . Smoking status: Former Smoker    Types: Cigarettes    Last attempt to quit: 07/01/1995    Years since quitting: 22.8  . Smokeless tobacco: Never Used  Substance and Sexual Activity  . Alcohol use: No    Alcohol/week: 0.0 standard drinks  . Drug use: No  . Sexual activity: Not Currently  Lifestyle  . Physical activity:    Days per week: 0 days    Minutes per session: 0 min  . Stress: Not at all  Relationships  . Social connections:    Talks on phone: Not on file    Gets together: Not on file    Attends religious service: Not on file    Active member of club or organization: Not on file    Attends meetings of clubs or organizations: Not on file    Relationship status: Not on file  Other Topics Concern  . Not on file  Social History Narrative  . Not on file    Outpatient Encounter Medications as of 04/27/2018  Medication Sig  . Ascorbic Acid (VITAMIN C) 1000 MG tablet Take 1,000 mg by mouth daily.  [name] Kitchen aspirin EC 81 MG tablet Take 81 mg by mouth daily.  . baclofen (LIORESAL) 10 MG tablet TAKE 2 TABLETS (20 MG TOTAL) BY MOUTH 3 (THREE) TIMES DAILY.  [name] Kitchen Biotin 1 MG CAPS Take by mouth.  . clonazePAM (KLONOPIN) 1 MG tablet Take 1 tablet (1 mg total) by mouth at bedtime.  . diphenhydrAMINE (BENADRYL) 25 MG tablet Take 25 mg by mouth 3 (three) times daily.  [name] Kitchen doxepin  (SINEQUAN) 10 MG capsule TAKE 1 OR 2 TABLETS BY MOUTH AT BEDTIME  . ergocalciferol (VITAMIN D2) 50000 UNITS capsule Take 50,000 Units by mouth once a week.   . etodolac (LODINE) 500 MG tablet Take 500 mg by mouth. 1-2 times a day  . gabapentin (NEURONTIN) 300 MG capsule Take 1 capsule (300 mg total) by mouth 3 (three) times daily.  . Grape Seed 100 MG CAPS Take by mouth.  [name] Kitchen HYDROcodone-acetaminophen (NORCO/VICODIN) 5-325 MG tablet Take 1 tablet by mouth 3 (three) times daily as needed for moderate pain.  [name] Kitchen  Lactobacillus (PROBIOTIC ACIDOPHILUS PO) Take by mouth.  . levocetirizine (XYZAL) 5 MG tablet Take 5 mg by mouth every evening.  . Melatonin 5 MG TABS Take 2 tablets by mouth at bedtime.  . QC LORATADINE-D 10-240 MG 24 hr tablet TAKE 1 TABLET BY MOUTH DAILY.  . rizatriptan (MAXALT-MLT) 10 MG disintegrating tablet Take 1 tablet (10 mg total) by mouth as needed for migraine. May repeat in 2 hours if needed.  Max of 2 tablets in 24 hours, and 10 tablets per month  . tiZANidine (ZANAFLEX) 2 MG tablet TAKE 1 TABLET (2 MG TOTAL) BY MOUTH EVERY 6 (SIX) HOURS AS NEEDED FOR MUSCLE SPASMS.  . Turmeric 1053 MG TABS Take 1,053 mg by mouth daily.  . vitamin E 400 UNIT capsule Take 400 Units by mouth daily.  . [DISCONTINUED] amoxicillin (AMOXIL) 500 MG tablet Take 500 mg by mouth. 4 tablets prior to dental procedures  . calcium-vitamin D (OSCAL WITH D) 500-200 MG-UNIT tablet Take 1 tablet by mouth.  . Echinacea 500 MG CAPS Take by mouth.  . mometasone (NASONEX) 50 MCG/ACT nasal spray Place 2 sprays into the nose daily as needed (allergies).  . zolpidem (AMBIEN) 10 MG tablet Take 10 mg by mouth at bedtime as needed. For sleep  . [DISCONTINUED] escitalopram (LEXAPRO) 10 MG tablet TAKE 1 TABLET (10 MG TOTAL) BY MOUTH DAILY. (Patient not taking: Reported on 04/27/2018)  . [DISCONTINUED] levETIRAcetam (KEPPRA) 500 MG tablet Take 1 tablet (500 mg total) by mouth 2 (two) times daily. (Patient not taking: Reported  on 04/27/2018)  . [DISCONTINUED] methylPREDNISolone (MEDROL) 4 MG tablet Take 6 tablets on day 1 and decrease by 1 tablet daily until gone. (Patient not taking: Reported on 04/27/2018)  . [DISCONTINUED] Teriflunomide (AUBAGIO) 14 MG TABS Take 14 mg by mouth daily.   No facility-administered encounter medications on file as of 04/27/2018.     Activities of Daily Living In your present state of health, do you have any difficulty performing the following activities: 04/27/2018  Hearing? N  Vision? Y  Comment blurred vision  Difficulty concentrating or making decisions? Y  Walking or climbing stairs? Y  Comment sometimes  Dressing or bathing? N  Doing errands, shopping? N  Preparing Food and eating ? N  Using the Toilet? N  In the past six months, have you accidently leaked urine? Y  Comment has MS  Do you have problems with loss of bowel control? Y  Comment has diarrhea  Managing your Medications? N  Managing your Finances? N  Housekeeping or managing your Housekeeping? Y  Comment gets assistance  Some recent data might be hidden    Patient Care Team: [name], MD as PCP - General (Internal Medicine) Felecia Shelling, Nanine Means, MD (Neurology)    Assessment:   This is a routine wellness examination for Enrique.  Exercise Activities and Dietary recommendations Current Exercise Habits: The patient does not participate in regular exercise at present, Exercise limited by: orthopedic condition(s)(has MS)  Goals    . Exercise 150 min/wk Moderate Activity (pt-stated)       Fall Risk Fall Risk  04/27/2018  Falls in the past year? 1  Number falls in past yr: 1  Comment loss of balance  Injury with Fall? 1  Comment popped neck (bulging disc)  Risk for fall due to : History of fall(s);Other (Comment)  Risk for fall due to: Comment has MS, Worked with therapy in a past   Is the patient's home free of loose throw  rugs in walkways, pet beds, electrical cords, etc?   yes      Grab bars  in the bathroom? no      Handrails on the stairs?   n/a      Adequate lighting?   yes  Timed Get Up and Go performed: n/a  Depression Screen PHQ 2/9 Scores 04/27/2018  PHQ - 2 Score 6  PHQ- 9 Score 21     Cognitive Function     6CIT Screen 04/27/2018  What Year? 0 points  What month? 0 points  What time? 0 points  Count back from 20 0 points  Months in reverse 2 points  Repeat phrase 2 points  Total Score 4     There is no immunization history on file for this patient.  Qualifies for Shingles Vaccine? no  Screening Tests Health Maintenance  Topic Date Due  . Hepatitis C Screening  1960/07/05  . HIV Screening  04/24/1978  . PAP SMEAR-Modifier  04/24/1984  . INFLUENZA VACCINE  08/08/2018 (Originally 12/08/2017)  . COLONOSCOPY  05/01/2018  . TETANUS/TDAP  01/11/2019  . MAMMOGRAM  05/03/2019    Cancer Screenings: Lung: Low Dose CT Chest recommended if Age 65-80 years, 30 pack-year currently smoking OR have quit w/in 15years. Patient does not qualify. Breast:  Up to date on Mammogram? No   Up to date of Bone Density/Dexa? Yes Colorectal: scheduled for January  Additional Screenings: : Hepatitis C Screening: due     Plan:    Plans to exercise regularly. Declined flu vaccine   I have personally reviewed and noted the following in the patient's chart:   . Medical and social history . Use of alcohol, tobacco or illicit drugs  . Current medications and supplements . Functional ability and status . Nutritional status . Physical activity . Advanced directives . List of other physicians . Hospitalizations, surgeries, and ER visits in previous 12 months . Vitals . Screenings to include cognitive, depression, and falls . Referrals and appointments  In addition, I have reviewed and discussed with patient certain preventive protocols, quality metrics, and best practice recommendations. A written personalized care plan for preventive services as well as general  preventive health recommendations were provided to patient.     Kellie Simmering, LPN  [id number]

## 2018-04-28 LAB — CMP14 + ANION GAP
ALT: 42 IU/L — ABNORMAL HIGH (ref 0–32)
ANION GAP: 18 mmol/L (ref 10.0–18.0)
AST: 43 IU/L — ABNORMAL HIGH (ref 0–40)
Albumin/Globulin Ratio: 2.2 (ref 1.2–2.2)
Albumin: 4.4 g/dL (ref 3.5–5.5)
Alkaline Phosphatase: 93 IU/L (ref 39–117)
BUN/Creatinine Ratio: 17 (ref 9–23)
BUN: 12 mg/dL (ref 6–24)
Bilirubin Total: 0.6 mg/dL (ref 0.0–1.2)
CALCIUM: 9.5 mg/dL (ref 8.7–10.2)
CO2: 25 mmol/L (ref 20–29)
CREATININE: 0.7 mg/dL (ref 0.57–1.00)
Chloride: 100 mmol/L (ref 96–106)
GFR calc Af Amer: 113 mL/min/{1.73_m2} (ref >59)
GFR calc non Af Amer: 98 mL/min/{1.73_m2} (ref >59)
Globulin, Total: 2 g/dL (ref 1.5–4.5)
Glucose: 95 mg/dL (ref 65–99)
Potassium: 3.9 mmol/L (ref 3.5–5.2)
Sodium: 143 mmol/L (ref 134–144)
TOTAL PROTEIN: 6.4 g/dL (ref 6.0–8.5)

## 2018-04-28 LAB — CBC
Hematocrit: 38.8 % (ref 34.0–46.6)
Hemoglobin: 13.6 g/dL (ref 11.1–15.9)
MCH: 32 pg (ref 26.6–33.0)
MCHC: 35.1 g/dL (ref 31.5–35.7)
MCV: 91 fL (ref 79–97)
Platelets: 327 10*3/uL (ref 150–450)
RBC: 4.25 x10E6/uL (ref 3.77–5.28)
RDW: 13.1 % (ref 12.3–15.4)
WBC: 7.5 10*3/uL (ref 3.4–10.8)

## 2018-04-28 LAB — T3, FREE: T3, Free: 4.4 pg/mL (ref 2.0–4.4)

## 2018-04-28 LAB — TSH: TSH: 1.93 u[IU]/mL (ref 0.450–4.500)

## 2018-04-28 LAB — T4, FREE: Free T4: 1.92 ng/dL — ABNORMAL HIGH (ref 0.82–1.77)

## 2018-04-29 ENCOUNTER — Other Ambulatory Visit: Payer: Self-pay

## 2018-04-29 ENCOUNTER — Other Ambulatory Visit: Payer: Self-pay | Admitting: Internal Medicine

## 2018-04-29 DIAGNOSIS — R7989 Other specified abnormal findings of blood chemistry: Secondary | ICD-10-CM

## 2018-04-30 ENCOUNTER — Encounter: Payer: Self-pay | Admitting: Internal Medicine

## 2018-05-01 ENCOUNTER — Other Ambulatory Visit: Payer: Self-pay | Admitting: Neurology

## 2018-05-01 ENCOUNTER — Telehealth: Payer: Self-pay | Admitting: Gastroenterology

## 2018-05-01 MED ORDER — HYDROCODONE-ACETAMINOPHEN 5-325 MG PO TABS: 1.0000 | ORAL_TABLET | Freq: Three times a day (TID) | ORAL | 0 refills | Status: DC | PRN

## 2018-05-01 NOTE — Telephone Encounter (Signed)
Pt states she cannot afford the 45-48$ fo rher Supre- she states she and her husband are on SS and she cant pay this. I explained to her that this is a great price for this prep as it can run on the upwards end of 180 Plus- she states she just cannot afford it.   Suprep sample  Lot  [id number]  Exp 11/21 as directed  Pt to pick up 3rd floor desk by 430 pm today  Lelan Pons PV

## 2018-05-01 NOTE — Telephone Encounter (Signed)
Patient states prep is $48 at the pharmacy and patient wants to know if we have any samples bc that is too expensive for her. Pt had pv 12.19.19.

## 2018-05-04 ENCOUNTER — Telehealth: Payer: Self-pay | Admitting: *Deleted

## 2018-05-04 NOTE — Telephone Encounter (Signed)
Placed printed/signed rx up front for pick up.

## 2018-05-08 ENCOUNTER — Ambulatory Visit
Admission: RE | Admit: 2018-05-08 | Discharge: 2018-05-08 | Disposition: A | Discharge status: Home / Self Care | Payer: Medicare Other | Source: Ambulatory Visit | Attending: Internal Medicine | Admitting: Internal Medicine

## 2018-05-08 DIAGNOSIS — R1084 Generalized abdominal pain: Secondary | ICD-10-CM

## 2018-05-09 ENCOUNTER — Ambulatory Visit
Admission: RE | Admit: 2018-05-09 | Discharge: 2018-05-09 | Disposition: A | Discharge status: Home / Self Care | Payer: Medicare Other | Source: Ambulatory Visit | Attending: Internal Medicine | Admitting: Internal Medicine

## 2018-05-09 DIAGNOSIS — N6320 Unspecified lump in the left breast, unspecified quadrant: Principal | ICD-10-CM

## 2018-05-09 DIAGNOSIS — N6325 Unspecified lump in the left breast, overlapping quadrants: Secondary | ICD-10-CM

## 2018-05-11 ENCOUNTER — Encounter: Payer: Self-pay | Admitting: Gastroenterology

## 2018-05-11 ENCOUNTER — Ambulatory Visit (AMBULATORY_SURGERY_CENTER): Payer: Medicare Other | Admitting: Gastroenterology

## 2018-05-11 VITALS — BP 111/65 | HR 71 | Temp 97.3°F | Resp 16 | Ht 65.0 in | Wt 227.0 lb

## 2018-05-11 DIAGNOSIS — Z86010 Personal history of colonic polyps: Secondary | ICD-10-CM | POA: Diagnosis not present

## 2018-05-11 MED ORDER — SODIUM CHLORIDE 0.9 % IV SOLN: 500.0000 mL | Freq: Once | INTRAVENOUS | Status: DC

## 2018-05-11 NOTE — Op Note (Signed)
[facility] Patient Name: Grace [name] Procedure Date: 05/11/2018 3:26 PM MRN: [id number] Endoscopist: Ladene Artist , MD Age: 58 Referring MD:  Date of Birth: 1960/08/05 Gender: Female Account #: [id number] Procedure:                Colonoscopy Indications:              Surveillance: Personal history of adenomatous                            polyps on last colonoscopy 5 years ago Medicines:                Monitored Anesthesia Care Procedure:                Pre-Anesthesia Assessment:                           - Prior to the procedure, a History and Physical                            was performed, and patient medications and                            allergies were reviewed. The patient's tolerance of                            previous anesthesia was also reviewed. The risks                            and benefits of the procedure and the sedation                            options and risks were discussed with the patient.                            All questions were answered, and informed consent                            was obtained. Prior Anticoagulants: The patient has                            taken no previous anticoagulant or antiplatelet                            agents. ASA Grade Assessment: III - A patient with                            severe systemic disease. After reviewing the risks                            and benefits, the patient was deemed in                            satisfactory condition to undergo the procedure.  After obtaining informed consent, the colonoscope                            was passed under direct vision. Throughout the                            procedure, the patient's blood pressure, pulse, and                            oxygen saturations were monitored continuously. The                            Model CF-HQ190L [id number]) scope was introduced                            through the anus and  advanced to the the cecum,                            identified by appendiceal orifice and ileocecal                            valve. The ileocecal valve, appendiceal orifice,                            and rectum were photographed. The quality of the                            bowel preparation was adequate. The colonoscopy was                            performed without difficulty. The patient tolerated                            the procedure well. Scope In: 3:36:46 PM Scope Out: 3:55:51 PM Scope Withdrawal Time: 0 hours 15 minutes 3 seconds  Total Procedure Duration: 0 hours 19 minutes 5 seconds  Findings:                 The perianal and digital rectal examinations were                            normal.                           Scattered medium-mouthed diverticula were found in                            the entire colon.                           The exam was otherwise without abnormality on                            direct and retroflexion views. Complications:            No immediate complications. Estimated blood loss:  None. Estimated Blood Loss:     Estimated blood loss: none. Impression:               - Scattered diverticulosis in the entire examined                            colon.                           - The examination was otherwise normal on direct                            and retroflexion views.                           - No specimens collected. Recommendation:           - Repeat colonoscopy in 5 years for surveillance.                           - Patient has a contact number available for                            emergencies. The signs and symptoms of potential                            delayed complications were discussed with the                            patient. Return to normal activities tomorrow.                            Written discharge instructions were provided to the                            patient.                            - High fiber diet.                           - Continue present medications. Ladene Artist, MD 05/11/2018 3:58:52 PM This report has been signed electronically.

## 2018-05-11 NOTE — Progress Notes (Signed)
Pt's states no medical or surgical changes since previsit or office visit. 

## 2018-05-11 NOTE — Progress Notes (Signed)
PT taken to PACU. Monitors in place. VSS. Report given to RN. 

## 2018-05-11 NOTE — Patient Instructions (Signed)
Impression/Recommendations:  Diverticulosis handout given to patient. High fiber diet handout given to patient.  Continue present medications.  Repeat colonoscopy in 5 years for surveillance.    YOU HAD AN ENDOSCOPIC PROCEDURE TODAY AT [facility]:   Refer to the procedure report that was given to you for any specific questions about what was found during the examination.  If the procedure report does not answer your questions, please call your gastroenterologist to clarify.  If you requested that your care partner not be given the details of your procedure findings, then the procedure report has been included in a sealed envelope for you to review at your convenience later.  YOU SHOULD EXPECT: Some feelings of bloating in the abdomen. Passage of more gas than usual.  Walking can help get rid of the air that was put into your GI tract during the procedure and reduce the bloating. If you had a lower endoscopy (such as a colonoscopy or flexible sigmoidoscopy) you may notice spotting of blood in your stool or on the toilet paper. If you underwent a bowel prep for your procedure, you may not have a normal bowel movement for a few days.  Please Note:  You might notice some irritation and congestion in your nose or some drainage.  This is from the oxygen used during your procedure.  There is no need for concern and it should clear up in a day or so.  SYMPTOMS TO REPORT IMMEDIATELY:   Following lower endoscopy (colonoscopy or flexible sigmoidoscopy):  Excessive amounts of blood in the stool  Significant tenderness or worsening of abdominal pains  Swelling of the abdomen that is new, acute  Fever of 100F or higher  For urgent or emergent issues, a gastroenterologist can be reached at any hour by calling [phone].   DIET:  We do recommend a small meal at first, but then you may proceed to your regular diet.  Drink plenty of fluids but you should avoid alcoholic beverages for  24 hours.  ACTIVITY:  You should plan to take it easy for the rest of today and you should NOT DRIVE or use heavy machinery until tomorrow (because of the sedation medicines used during the test).    FOLLOW UP: Our staff will call the number listed on your records the next business day following your procedure to check on you and address any questions or concerns that you may have regarding the information given to you following your procedure. If we do not reach you, we will leave a message.  However, if you are feeling well and you are not experiencing any problems, there is no need to return our call.  We will assume that you have returned to your regular daily activities without incident.  If any biopsies were taken you will be contacted by phone or by letter within the next 1-3 weeks.  Please call us at [phone] if you have not heard about the biopsies in 3 weeks.    SIGNATURES/CONFIDENTIALITY: You and/or your care partner have signed paperwork which will be entered into your electronic medical record.  These signatures attest to the fact that that the information above on your After Visit Summary has been reviewed and is understood.  Full responsibility of the confidentiality of this discharge information lies with you and/or your care-partner.

## 2018-05-12 ENCOUNTER — Telehealth: Payer: Self-pay

## 2018-05-12 NOTE — Telephone Encounter (Signed)
  Follow up Call-  Call back number 05/11/2018  Post procedure Call Back phone  # [phone]  Permission to leave phone message Yes  Some recent data might be hidden     Patient questions:  Do you have a fever, pain , or abdominal swelling? No. Pain Score  0 *  Have you tolerated food without any problems? Yes.    Have you been able to return to your normal activities? Yes.    Do you have any questions about your discharge instructions: Diet   No. Medications  No. Follow up visit  No.  Do you have questions or concerns about your Care? No.  Actions: * If pain score is 4 or above: No action needed, pain <4.

## 2018-05-15 ENCOUNTER — Encounter: Payer: Medicare Other | Admitting: Gastroenterology

## 2018-05-23 ENCOUNTER — Other Ambulatory Visit: Payer: Medicare Other

## 2018-05-23 ENCOUNTER — Other Ambulatory Visit: Payer: Self-pay | Admitting: Internal Medicine

## 2018-05-23 DIAGNOSIS — R1010 Upper abdominal pain, unspecified: Secondary | ICD-10-CM

## 2018-05-23 DIAGNOSIS — R7989 Other specified abnormal findings of blood chemistry: Secondary | ICD-10-CM

## 2018-05-23 DIAGNOSIS — R634 Abnormal weight loss: Secondary | ICD-10-CM

## 2018-05-23 NOTE — Progress Notes (Signed)
[name], Grace  [name], [name], PA-C        Wants results from stomach scan and is interested in stomach ct scan wants t3 and t4 blood test rechecked. I dont see results on here. Called to set up appt for blood test and referral for stomach ct but no answer      She has an abdominal Ultrasound ordered, not CT. That's what Dr Baird Cancer ordered initially.  The lab order is already placed.

## 2018-05-23 NOTE — Progress Notes (Signed)
OK. I've ordered it.

## 2018-05-24 LAB — T4, FREE: Free T4: 0.89 ng/dL (ref 0.82–1.77)

## 2018-06-01 ENCOUNTER — Other Ambulatory Visit: Payer: Self-pay

## 2018-06-01 ENCOUNTER — Encounter: Payer: Self-pay | Admitting: Neurology

## 2018-06-01 ENCOUNTER — Ambulatory Visit (INDEPENDENT_AMBULATORY_CARE_PROVIDER_SITE_OTHER): Payer: Medicare Other | Admitting: Neurology

## 2018-06-01 VITALS — BP 133/82 | HR 86 | Ht 65.0 in | Wt 218.5 lb

## 2018-06-01 DIAGNOSIS — R260 Ataxic gait: Secondary | ICD-10-CM

## 2018-06-01 DIAGNOSIS — R5383 Other fatigue: Secondary | ICD-10-CM

## 2018-06-01 DIAGNOSIS — R7989 Other specified abnormal findings of blood chemistry: Secondary | ICD-10-CM

## 2018-06-01 DIAGNOSIS — R208 Other disturbances of skin sensation: Secondary | ICD-10-CM

## 2018-06-01 DIAGNOSIS — G35 Multiple sclerosis: Secondary | ICD-10-CM

## 2018-06-01 NOTE — Progress Notes (Signed)
[facility]  PATIENT: Grace [name] DOB: 05-20-1960  REFERRING DOCTOR OR PCP:  [name] SOURCE: patient  _________________________________   HISTORICAL  CHIEF COMPLAINT:  Chief Complaint  Patient presents with  . Follow-up    RM 13, with husband.. Last seen 09/28/17. Reports no new sx.  . Multiple Sclerosis    Stopped Aubagio 12-27-2017. Liver enzymes were high, had pain in legs. She is not on DMT.    HISTORY OF PRESENT ILLNESS:  Grace [name] is a 58 y.o. woman with multiple sclerosis.     Update 06/01/2018: She feels her MS is stable.   She has not had any new symptoms since the last visit but had an exacerbation May 2019 (spinal).   She feels she has recovered.   She went on Aubagio but had LFT elevation and stopped.    Many years ago she was on Betaseron and Copaxone.    She was having propriospinal myoclonus and a Lhermitte sign at the onset of her exacerbation last year.    The myoclonus is better but the Lhermitte is the same.     She has right eye blurriness and was found to have an epiretinal membrane OU.    She also has had epidural steroid injections x 2 at [facility].  She recently got braces and notes more snoring.   She has had PSG in the past.  One had mild OSA and another was normal.     Update 09/28/2017: At the last visit, after going many years without an exacerbation and being off of disease modifying therapies for long time, she had the onset of right-sided numbness and clumsiness and was found on MRI to have an acute lesion at C6-C7 (and 2 older lesions at C5 and T2) Aubagio was prescribed but she has not yet started due to insurance and co-pay issues.    She still has an intermittent Lhermitte sign and has some jerks in her limbs when drowsy.   The alprazolam did not help the jerks.      She is still noting a hot sensation all over with sweating and associated with elevated BP lasting 10-90 minutes.    These occur more  at night.     She first noted this in February and when she saw Ortho, she was prescribed a steroid pack.    A few days after the steroid, symptoms recurred.    She was having more blurry vision and saw Dr. Hassell Done at [facility].   A macular buckle was seen and she is being referred to a retina specialist.    Update 09/15/2017: In the past, she was on Betaseron and Copaxone.   She felt crazy on Copaxone and had trouble proessing.  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   She was diagnosed after ON in 1997 and had her big exacerbation in 2002 (C5).  She has had no DMT  Currently, she has numbness and clumsiness on the right.  The numbness has become very painful recently.  She feels it in both hands and feet but it is worse on the right.  When she is drowsy she gets jerks in bed.     She also notes a Lhermitte sign.   She feels her sleep is worse in general with insomnia.  I personally reviewed the MRI of the cervical spine from 06/24/2017.  It shows 2 old plaques, 1 posteriorly adjacent  to C5 and another adjacent to T2.  Additionally there is a plaque adjacent to C6-C7 in the right posterior spinal cord.  There is subtle enhancement consistent with a more acute lesion.  This focus was not present on the previous MRI from 2016.   Update 04/28/2017:   She remains on a disease modifying therapy for her MS. She has not noted any definite exacerbations recently her fatigue is worse. MRIs have shown a few predominantly nonspecific white matter foci and there has been no change from 2013-2018. Although there are only a couple nonspecific foci of the brain, she does have a couple foci within the spinal cord prompting her diagnosis.]  Her gait is stable. She stumbles some but has no falls. Strength is fine. She often notes spasticity in the legs. Baclofen and tizanidine has helped some. She also has some burning dysesthetic pain in feet, and to a  lesser extent in the hands. Unfortunately, she did not get much benefit from multiple medications including nortriptyline, gabapentin, Trileptal, Lamictal, Lyrica and tramadol.    She notes blurry vision has worsened, right worse than left.  Colors are desaturated, more on the right.   Vision seems better in cold temperatures.  . Bladder function is fine.  Reports a lot of fatigue. She was on phentermine but had mild side effects and stopped.   She thinks it may have helped slightly.    Depression has done better on Lexapro. Depression worsened when her husband had a stroke in 2015.   From 11/08/2016: MS:   She denies recent exacerbation But notes more left facial pain and some vision blurring at times..    She was on Betaseron and Copaxone in the past.   She has not wanted to try any of the oral agents or IV drugs.   Her last MRI 2/18 and was unchanged from the previous 2 MRIs (2016 2013) and just shows a few fairly nonspecific foci.  She is noting more left facial pain, right facial pain near the ear and lower back.    MRI of the brain 06/2016 showed a few stable T2 spots which is fairly non-specific and unchanged compared to her previous MRI form 2016.     Facial Pain:   She was experiencing facial pain in the left cheek that fluctuated.  We started Keppra and it helped some.  \ Lower back pain:   She sees Dr. Lynann Bologna for her lower back pain and has had a bilateral L5S1 facet block.  She felt pain was better for a couple days but then pain increased when she caught her husband's fall a day later.  Pain has continued.  Gait/strength/sensation:  Gait is stable with some stumbles but no recent falls.   She notes a mild limp (but right hip has been hurting more).    Strength is fine.  She has mild spasticity, helped by baclofen and tizanidine (she alternates).     She has burning dysesthetic pain in the front of the legs and feet and in the the hands (lLeft = Right).   Nortriptyline, gabapentin and  trileptal were not effective and Lamictal caused a rash.   Lyrica was not tolerated .   Tramadol was not well tolerated.    She reports mild left leg weakness (old) but notes her gait is slightly clumsy.    Spasticity is helped with soma, tizanidine and baclofen.   Vision:  She also is noting left eye pain and blurry vision but her eye  exam was normal by ophthalmology.        Bladder: She notes urinary urgency and frequency.  Fatigue/sleep:   She reports a lot of physical and cognitive fatigue. Ritalin and Provigil have not helped her any. She also has sleep onset and sleep maintenance insomnia.   She takes muscle relaxants and doxepin at bedtime. She was on CPAP years ago but later PSG did not show OSA . Her periodic limb movements of sleep were improved with gabapentin.       Mood/cognition:  She feels her to depression has been worse. Her husband is disabled and she feels mood has been worse since his stroke. She has mild anxiety.   She was once on SSRI it is not sure if it helped her much.   She notes mild cognitive dysfunction.   She reports reduced short term memory and verbal fluency.   Ritalin did not help cognition much .     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 She had optic neuritis in 1999. In 2002, she had right-sided numbness. MRIs of the brain show just a couple of nonspecific foci. A cervical spine MRI showed a focus consistent with demyelination at C5. Additionally, there are 2 smaller foci at T2 and T3-T4.  CSF was also abnormal c/w MS.     She was initially placed on Betaseron and then on Copaxone. She was unable to tolerate either of these and has not been on any DMT medication since 2003.   She retried Betaseron but stopped after diagnosis of avascular necrosis requiring hip replacement.    Of note, she had had only one course of IV steroids prior to that and has had some afterwards.   She had not wanted to go on Tysabri or  Gilenya and opted to go on no disease modifying therapy since 2012. At her last visit on 01/17/2014, she decided to go on Aubagio but never started.     Most recent MRIs are from earlier this year and showed no changes.   REVIEW OF SYSTEMS: Constitutional: No fevers, chills, sweats, or change in appetite.  Has fatigue Eyes: No visual changes, double vision, eye pain Ear, nose and throat: No hearing loss, ear pain, nasal congestion, sore throat Cardiovascular: No chest pain, palpitations Respiratory: No shortness of breath at rest or with exertion.   No wheezes GastrointestinaI: No nausea, vomiting, diarrhea, abdominal pain, fecal incontinence Genitourinary: No dysuria, urinary retention or frequency.  No nocturia. Musculoskeletal: She reports back pain and pain and some of her joints, especially her knees and hips. Integumentary: No rash, pruritus, skin lesions Neurological: as above.  She reports restless leg, insomnia and excessive daytime sleepiness. Psychiatric: No depression at this time.  No anxiety Endocrine: No palpitations, diaphoresis, change in appetite, change in weigh.  He notes heat intolerance and excessive thirst at times. Hematologic/Lymphatic: No anemia, purpura, petechiae. Allergic/Immunologic: No itchy/runny eyes, nasal congestion, recent allergic reactions, rashes  ALLERGIES: Allergies  Allergen Reactions  . Lamictal [Lamotrigine] Shortness Of Breath, Itching and Swelling    Tongue swells  . Mobic [Meloxicam] Anaphylaxis  . Ultram [Tramadol Hcl] Anaphylaxis  . Codeine Itching  . Diclofenac Itching  . Latex     Latex band-aids cause redness and tears your skin  . Teriflunomide Diarrhea    Hair loss, joint pains, elevated liver enzymes  . Tape Rash    HOME MEDICATIONS:  Current Outpatient Medications:  .  Ascorbic Acid (VITAMIN C) 1000 MG tablet, Take 1,000 mg by mouth daily., Disp: , Rfl:  .  aspirin EC 81 MG tablet, Take 81 mg by mouth daily., Disp: ,  Rfl:  .  baclofen (LIORESAL) 10 MG tablet, TAKE 2 TABLETS (20 MG TOTAL) BY MOUTH 3 (THREE) TIMES DAILY., Disp: 180 tablet, Rfl: 11 .  calcium-vitamin D (OSCAL WITH D) 500-200 MG-UNIT tablet, Take 1 tablet by mouth., Disp: , Rfl:  .  clonazePAM (KLONOPIN) 1 MG tablet, Take 1 tablet (1 mg total) by mouth at bedtime., Disp: 30 tablet, Rfl: 0 .  diphenhydrAMINE (BENADRYL) 25 MG tablet, Take 25 mg by mouth 3 (three) times daily., Disp: , Rfl:  .  doxepin (SINEQUAN) 10 MG capsule, TAKE 1 OR 2 TABLETS BY MOUTH AT BEDTIME, Disp: 60 capsule, Rfl: 11 .  ergocalciferol (VITAMIN D2) 50000 UNITS capsule, Take 50,000 Units by mouth once a week. , Disp: , Rfl:  .  etodolac (LODINE) 500 MG tablet, Take 500 mg by mouth. 1-2 times a day, Disp: , Rfl:  .  gabapentin (NEURONTIN) 300 MG capsule, Take 1 capsule (300 mg total) by mouth 3 (three) times daily., Disp: 90 capsule, Rfl: 11 .  Grape Seed 100 MG CAPS, Take by mouth., Disp: , Rfl:  .  HYDROcodone-acetaminophen (NORCO/VICODIN) 5-325 MG tablet, Take 1 tablet by mouth 3 (three) times daily as needed for moderate pain., Disp: 90 tablet, Rfl: 0 .  Lactobacillus (PROBIOTIC ACIDOPHILUS PO), Take by mouth., Disp: , Rfl:  .  levocetirizine (XYZAL) 5 MG tablet, Take 5 mg by mouth every evening., Disp: , Rfl:  .  Melatonin 5 MG TABS, Take 2 tablets by mouth at bedtime., Disp: , Rfl:  .  mometasone (NASONEX) 50 MCG/ACT nasal spray, Place 2 sprays into the nose daily as needed (allergies)., Disp: , Rfl:  .  QC LORATADINE-D 10-240 MG 24 hr tablet, TAKE 1 TABLET BY MOUTH DAILY., Disp: 30 tablet, Rfl: 6 .  rizatriptan (MAXALT-MLT) 10 MG disintegrating tablet, Take 1 tablet (10 mg total) by mouth as needed for migraine. May repeat in 2 hours if needed.  Max of 2 tablets in 24 hours, and 10 tablets per month, Disp: 10 tablet, Rfl: 11 .  tiZANidine (ZANAFLEX) 2 MG tablet, TAKE 1 TABLET (2 MG TOTAL) BY MOUTH EVERY 6 (SIX) HOURS AS NEEDED FOR MUSCLE SPASMS., Disp: 120 tablet, Rfl:  11 .  Turmeric 1053 MG TABS, Take 1,053 mg by mouth daily., Disp: , Rfl:  .  vitamin E 400 UNIT capsule, Take 400 Units by mouth daily., Disp: , Rfl:  .  zolpidem (AMBIEN) 10 MG tablet, Take 10 mg by mouth at bedtime as needed. For sleep, Disp: , Rfl:   PAST MEDICAL HISTORY: Past Medical History:  Diagnosis Date  . Allergy   . Anxiety   . Avascular necrosis ([place])   . Colon polyp    pre-cancerous  . Complication of anesthesia    patient states she requires more anesthesia  . Disorder of soft tissue   . Diverticulosis   . GERD (gastroesophageal reflux disease)   . H/O breast biopsy    pre- cancerous cells  . Hand fracture, left    9/87  . Heart palpitations   . History of Holter monitoring   . Hyperlipidemia   . Lhermitte's syndrome   . Migraines   . Multiple sclerosis ([place])    dx in 11/1996  . Multiple sclerosis ([place])   . Neuromuscular disorder ([place])   .  Neuropathy   . Numbness and tingling in hands   . Obesity   . OSA (obstructive sleep apnea)    no longer have OSA since stopped taking a MS medication  . Otitis media   . Raynaud's phenomenon   . Sleep apnea    Due to medication that she no longer takes.  . Vaginitis and vulvovaginitis   . Vitamin D deficiency     PAST SURGICAL HISTORY: Past Surgical History:  Procedure Laterality Date  . ABDOMINAL HYSTERECTOMY Bilateral 02/12/2016   Procedure: HYSTERECTOMY ABDOMINAL WITH BILATERAL SALPINGO OOPHERECTOMY;  Surgeon: Dian Queen, MD;  Location: [facility];  Service: Gynecology;  Laterality: Bilateral;  . BREAST BIOPSY     core/left breast  . CHOLECYSTECTOMY    . COLONOSCOPY     1997/2001  . DILATION AND CURETTAGE OF UTERUS    . ENDOMETRIAL ABLATION    . LAPAROSCOPIC ABDOMINAL EXPLORATION     ovaries and intestines bond together  . LEFT HEART CATHETERIZATION WITH CORONARY ANGIOGRAM N/A 06/21/2011   Procedure: LEFT HEART CATHETERIZATION WITH CORONARY ANGIOGRAM;  Surgeon: Leonie Man, MD;  Location: [facility] CATH LAB;   Service: Cardiovascular;  Laterality: N/A;  . LUMBAR PUNCTURE     11/01/1996  . SALPINGOOPHORECTOMY Bilateral 02/12/2016   Procedure: BILATERAL SALPINGO OOPHORECTOMY;  Surgeon: Dian Queen, MD;  Location: [facility];  Service: Gynecology;  Laterality: Bilateral;  . TOTAL HIP ARTHROPLASTY     left hip  6/02  /right hip12/04    FAMILY HISTORY: Family History  Problem Relation Age of Onset  . Pancreatic cancer Mother   . Stroke Mother   . Testicular cancer Brother   . Colon cancer Maternal Uncle   . Pancreatic cancer Paternal Uncle   . Prostate cancer Maternal Grandfather   . Colon polyps Father     SOCIAL HISTORY:  Social History   Socioeconomic History  . Marital status: Married    Spouse name: Not on file  . Number of children: Not on file  . Years of education: Not on file  . Highest education level: Not on file  Occupational History  . Occupation: disabled  Social Needs  . Financial resource strain: Not hard at all  . Food insecurity:    Worry: Never true    Inability: Never true  . Transportation needs:    Medical: No    Non-medical: No  Tobacco Use  . Smoking status: Former Smoker    Types: Cigarettes    Last attempt to quit: 07/01/1995    Years since quitting: 22.9  . Smokeless tobacco: Never Used  Substance and Sexual Activity  . Alcohol use: No    Alcohol/week: 0.0 standard drinks  . Drug use: No  . Sexual activity: Not Currently  Lifestyle  . Physical activity:    Days per week: 0 days    Minutes per session: 0 min  . Stress: Not at all  Relationships  . Social connections:    Talks on phone: Not on file    Gets together: Not on file    Attends religious service: Not on file    Active member of club or organization: Not on file    Attends meetings of clubs or organizations: Not on file    Relationship status: Not on file  . Intimate partner violence:    Fear of current or ex partner: No    Emotionally abused: No    Physically abused: No     Forced sexual activity: No  Other  Topics Concern  . Not on file  Social History Narrative  . Not on file     PHYSICAL EXAM  Vitals:   06/01/18 1527  BP: 133/82  Pulse: 86  Weight: 218 lb 8 oz (99.1 kg)  Height: 5\' 5"  (1.651 m)    Body mass index is 36.36 kg/m.   General: The patient is well-developed and well-nourished and in no acute distress  Neurologic Exam  Mental status: The patient is alert and oriented x 3 at the time of the examination. The patient has apparent normal recent and remote memory, with an apparently normal attention span and concentration ability.   Speech is normal.  Cranial nerves: Extraocular movements are full.  She has a right APD and colors are reduced out of the right eye.  Facial strength and sensation was normal.. No obvious hearing deficits are noted.  Motor:  Muscle bulk is normal.   She has increased muscle tone in the legs, left greater than right.  Strength is 5/5 except for the left 4+/5 left EHL  Sensory: Sensory testing shows reduced touch and vibration sensation on the right side.. Sensation was normal on the left.  Coordination: Cerebellar testing reveals good finger-nose-finger and mildly reduced bilateral heel-to-shin  Gait and station: Station is normal.   The gait is mildly wide.  Tandem gait is poor. The Romberg is negative.  Reflexes: Deep tendon reflexes are symmetric and normal bilaterally.         DIAGNOSTIC DATA (LABS, IMAGING, TESTING) - I reviewed patient records, labs, notes, testing and imaging myself where available.    ASSESSMENT AND PLAN  Multiple sclerosis (HCC)  Ataxic gait  Low serum vitamin D - Plan: VITAMIN D 25 Hydroxy (Vit-D Deficiency, Fractures)  Other fatigue  Dysesthesia    1.    We discussed getting back on a DMT, especially since she had an exacerbation last year.   Last brain MRI 02/05/2018 was unchanged.  She would prefer not to go back on a disease modifying therapy.  We discussed the  risks involved of not going on a DMT.  We will continue to monitor her. 2     Now has braces so will hold off on another MRI.     3.   Try to be more active and exercises.  Try to lose weight 4.    She will return to see me and 5 months or sooner if she has new or worsening neurologic symptoms.    Anara Cowman A. Felecia Shelling, MD, PhD 0/30/0923, 3:00 PM Certified in Neurology, Clinical Neurophysiology, Sleep Medicine, Pain Medicine and Neuroimaging  [facility] [address], [place], [place] [address] [phone]

## 2018-06-02 ENCOUNTER — Ambulatory Visit
Admission: RE | Admit: 2018-06-02 | Discharge: 2018-06-02 | Disposition: A | Discharge status: Home / Self Care | Payer: Medicare Other | Source: Ambulatory Visit | Attending: Internal Medicine | Admitting: Internal Medicine

## 2018-06-02 ENCOUNTER — Telehealth: Payer: Self-pay | Admitting: *Deleted

## 2018-06-02 DIAGNOSIS — R1010 Upper abdominal pain, unspecified: Secondary | ICD-10-CM

## 2018-06-02 LAB — VITAMIN D 25 HYDROXY (VIT D DEFICIENCY, FRACTURES): Vit D, 25-Hydroxy: 38.4 ng/mL (ref 30.0–100.0)

## 2018-06-02 NOTE — Addendum Note (Signed)
Addended by: Noberto Retort C on: 06/02/2018 12:24 PM   Modules accepted: Orders

## 2018-06-02 NOTE — Telephone Encounter (Signed)
-----   Message from Britt Bottom, MD sent at 06/02/2018 11:00 AM EST ----- Please let her know that the vitamin D level was in the normal range.  She should continue over-the-counter vitamin D.

## 2018-06-02 NOTE — Telephone Encounter (Signed)
Left patient a detailed message, with results and instructions to continue OTC vitamin D supplements, on voicemail (ok per DPR).  Provided our number to call back with any questions.

## 2018-06-07 ENCOUNTER — Other Ambulatory Visit: Payer: Self-pay | Admitting: Internal Medicine

## 2018-06-07 DIAGNOSIS — K760 Fatty (change of) liver, not elsewhere classified: Secondary | ICD-10-CM

## 2018-06-07 DIAGNOSIS — R1084 Generalized abdominal pain: Secondary | ICD-10-CM

## 2018-06-07 NOTE — Progress Notes (Unsigned)
abd CT ordered

## 2018-06-12 ENCOUNTER — Other Ambulatory Visit: Payer: Self-pay | Admitting: Internal Medicine

## 2018-06-12 DIAGNOSIS — R1030 Lower abdominal pain, unspecified: Secondary | ICD-10-CM

## 2018-06-12 NOTE — Progress Notes (Signed)
Pelvic CT order added after radiology dept left [place] message to include this as well if pt had pain below umbilicus.

## 2018-06-14 DIAGNOSIS — K760 Fatty (change of) liver, not elsewhere classified: Secondary | ICD-10-CM | POA: Insufficient documentation

## 2018-06-14 DIAGNOSIS — K5790 Diverticulosis of intestine, part unspecified, without perforation or abscess without bleeding: Secondary | ICD-10-CM | POA: Insufficient documentation

## 2018-06-16 ENCOUNTER — Other Ambulatory Visit: Payer: Self-pay

## 2018-06-16 MED ORDER — HYDROCODONE-ACETAMINOPHEN 5-325 MG PO TABS: 1.0000 | ORAL_TABLET | Freq: Three times a day (TID) | ORAL | 0 refills | Status: DC | PRN

## 2018-06-20 ENCOUNTER — Ambulatory Visit
Admission: RE | Admit: 2018-06-20 | Discharge: 2018-06-20 | Disposition: A | Discharge status: Home / Self Care | Payer: Medicare Other | Source: Ambulatory Visit | Attending: Internal Medicine | Admitting: Internal Medicine

## 2018-06-20 DIAGNOSIS — R1030 Lower abdominal pain, unspecified: Secondary | ICD-10-CM

## 2018-06-20 MED ORDER — IOPAMIDOL (ISOVUE-300) INJECTION 61%
100.0000 mL | Freq: Once | INTRAVENOUS | Status: AC | PRN
  Administered 2018-06-20: 100 mL via INTRAVENOUS

## 2018-06-21 ENCOUNTER — Other Ambulatory Visit: Payer: Self-pay | Admitting: Internal Medicine

## 2018-06-21 DIAGNOSIS — K760 Fatty (change of) liver, not elsewhere classified: Secondary | ICD-10-CM

## 2018-06-21 DIAGNOSIS — K5790 Diverticulosis of intestine, part unspecified, without perforation or abscess without bleeding: Secondary | ICD-10-CM

## 2018-06-21 NOTE — Progress Notes (Signed)
CT findings updated on problem list

## 2018-08-23 ENCOUNTER — Other Ambulatory Visit: Payer: Self-pay

## 2018-08-24 MED ORDER — HYDROCODONE-ACETAMINOPHEN 5-325 MG PO TABS: 1.0000 | ORAL_TABLET | Freq: Three times a day (TID) | ORAL | 0 refills | Status: DC | PRN

## 2018-08-24 NOTE — Telephone Encounter (Signed)
One-time prescription on June 16 2018 for hydrocodone 90 tablets, I will not continue to refill her prescription,

## 2018-08-24 NOTE — Telephone Encounter (Signed)
[place] Database Verified LR: 06/16/2018 Qty: 90 Pending appointment: 11/30/2018 Debbora Presto, NP)

## 2018-08-24 NOTE — Addendum Note (Signed)
Addended by: Britt Bottom on: 08/24/2018 05:38 PM   Modules accepted: Orders

## 2018-09-01 ENCOUNTER — Encounter: Payer: Self-pay | Admitting: Internal Medicine

## 2018-09-02 ENCOUNTER — Other Ambulatory Visit: Payer: Self-pay | Admitting: Neurology

## 2018-09-04 ENCOUNTER — Other Ambulatory Visit: Payer: Self-pay

## 2018-09-04 MED ORDER — LEVOCETIRIZINE DIHYDROCHLORIDE 5 MG PO TABS: 5.0000 mg | ORAL_TABLET | Freq: Every evening | ORAL | 1 refills | Status: DC

## 2018-11-02 ENCOUNTER — Other Ambulatory Visit: Payer: Self-pay | Admitting: Neurology

## 2018-11-02 ENCOUNTER — Other Ambulatory Visit: Payer: Self-pay

## 2018-11-02 MED ORDER — HYDROCODONE-ACETAMINOPHEN 5-325 MG PO TABS: 1.0000 | ORAL_TABLET | Freq: Three times a day (TID) | ORAL | 0 refills | Status: DC | PRN

## 2018-11-30 ENCOUNTER — Encounter: Payer: Self-pay | Admitting: Family Medicine

## 2018-11-30 ENCOUNTER — Ambulatory Visit (INDEPENDENT_AMBULATORY_CARE_PROVIDER_SITE_OTHER): Payer: Medicare Other | Admitting: Family Medicine

## 2018-11-30 ENCOUNTER — Other Ambulatory Visit: Payer: Self-pay

## 2018-11-30 VITALS — BP 148/91 | HR 98 | Temp 98.0°F | Ht 66.0 in | Wt 218.6 lb

## 2018-11-30 DIAGNOSIS — G35 Multiple sclerosis: Secondary | ICD-10-CM | POA: Diagnosis not present

## 2018-11-30 MED ORDER — PREDNISONE 20 MG PO TABS: ORAL_TABLET | ORAL | 0 refills | Status: DC

## 2018-11-30 MED ORDER — TRAZODONE HCL 100 MG PO TABS: 100.0000 mg | ORAL_TABLET | Freq: Every day | ORAL | 3 refills | Status: DC

## 2018-11-30 NOTE — Patient Instructions (Addendum)
Steroid taper as prescribed  Stop doxepin, start trazodone 100mg  at night  Follow up with Dr Felecia Shelling in 3 months  Multiple Sclerosis Multiple sclerosis (MS) is a disease of the brain, spinal cord, and optic nerves (central nervous system). It causes the body's disease-fighting (immune) system to destroy the protective covering (myelin sheath) around nerves in the brain. When this happens, signals (nerve impulses) going to and from the brain and spinal cord do not get sent properly or may not get sent at all. There are several types of MS:  Relapsing-remitting MS. This is the most common type. This causes sudden attacks of symptoms. After an attack, you may recover completely until the next attack, or some symptoms may remain permanently.  Secondary progressive MS. This usually develops after the onset of relapsing-remitting MS. Similar to relapsing-remitting MS, this type also causes sudden attacks of symptoms. Attacks may be less frequent, but symptoms slowly get worse (progress) over time.  Primary progressive MS. This causes symptoms that steadily progress over time. This type of MS does not cause sudden attacks of symptoms. The age of onset of MS varies, but it often develops between 2-41 years of age. MS is a lifelong (chronic) condition. There is no cure, but treatment can help slow down the progression of the disease. What are the causes? The cause of this condition is not known. What increases the risk? You are more likely to develop this condition if:  You are a woman.  You have a relative with MS. However, the condition is not passed from parent to child (inherited).  You have a lack (deficiency) of vitamin D.  You smoke. MS is more common in the [place] than in the [place]. What are the signs or symptoms? Relapsing-remitting and secondary progressive MS cause symptoms to occur in episodes or attacks that may last weeks to months. There may be long  periods between attacks in which there are almost no symptoms. Primary progressive MS causes symptoms to steadily progress after they develop. Symptoms of MS vary because of the many different ways it affects the central nervous system. The main symptoms include:  Vision problems and eye pain.  Numbness.  Weakness.  Inability to move your arms, hands, feet, or legs (paralysis).  Balance problems.  Shaking that you cannot control (tremors).  Muscle spasms.  Problems with thinking (cognitive changes). MS can also cause symptoms that are associated with the disease, but are not always the direct result of an MS attack. They may include:  Inability to control urination or bowel movements (incontinence).  Headaches.  Fatigue.  Inability to tolerate heat.  Emotional changes.  Depression.  Pain. How is this diagnosed? This condition is diagnosed based on:  Your symptoms.  A neurological exam. This involves checking central nervous system function, such as nerve function, reflexes, and coordination.  MRIs of the brain and spinal cord.  Lab tests, including a lumbar puncture that tests the fluid that surrounds the brain and spinal cord (cerebrospinal fluid).  Tests to measure the electrical activity of the brain in response to stimulation (evoked potentials). How is this treated? There is no cure for MS, but medicines can help decrease the number and frequency of attacks and help relieve nuisance symptoms. Treatment options may include:  Medicines that reduce the frequency of attacks. These medicines may be given by injection, by mouth (orally), or through an IV.  Medicines that reduce inflammation (steroids). These may provide short-term relief of symptoms.  Medicines to help control pain, depression, fatigue, or incontinence.  Vitamin D, if you have a deficiency.  Using devices to help you move around (assistive devices), such as braces, a cane, or a walker.   Physical therapy to strengthen and stretch your muscles.  Occupational therapy to help you with everyday tasks.  Alternative or complementary treatments such as exercise, massage, or acupuncture. Follow these instructions at home:  Take over-the-counter and prescription medicines only as told by your health care provider.  Do not drive or use heavy machinery while taking prescription pain medicine.  Use assistive devices as recommended by your physical therapist or your health care provider.  Exercise as directed by your health care provider.  Return to your normal activities as told by your health care provider. Ask your health care provider what activities are safe for you.  Reach out for support. Share your feelings with friends, family, or a support group.  Keep all follow-up visits as told by your health care provider and therapists. This is important. Where to find more information  National Multiple Sclerosis Society: https://www.nationalmssociety.org Contact a health care provider if:  You feel depressed.  You develop new pain or numbness.  You have tremors.  You have problems with sexual function. Get help right away if:  You develop paralysis.  You develop numbness.  You have problems with your bladder or bowel function.  You develop double vision.  You lose vision in one or both eyes.  You develop suicidal thoughts.  You develop severe confusion. If you ever feel like you may hurt yourself or others, or have thoughts about taking your own life, get help right away. You can go to your nearest emergency department or call:  Your local emergency services (911 in the [place]  A suicide crisis helpline, such as the [facility] at 205-670-0799. This is open 24 hours a day. Summary  Multiple sclerosis (MS) is a disease of the central nervous system that causes the body's immune system to destroy the protective covering (myelin  sheath) around nerves in the brain.  There are 3 types of MS: relapsing-remitting, secondary progressive, and primary progressive. Relapsing-remitting and secondary progressive MS cause symptoms to occur in episodes or attacks that may last weeks to months. Primary progressive MS causes symptoms to steadily progress after they develop.  There is no cure for MS, but medicines can help decrease the number and frequency of attacks and help relieve nuisance symptoms. Treatment may also include physical or occupational therapy.  If you develop numbness, paralysis, vision problems, or other neurological symptoms, get help right away. This information is not intended to replace advice given to you by your health care provider. Make sure you discuss any questions you have with your health care provider. Document Released: 04/23/2000 Document Revised: 04/08/2017 Document Reviewed: 07/05/2016 [employer] Patient Education  2020 [employer].

## 2018-11-30 NOTE — Progress Notes (Signed)
PATIENT: Grace [name] DOB: [birth year]  REASON FOR VISIT: follow up HISTORY FROM: patient  Chief Complaint  Patient presents with  . Follow-up    room 2, MS f/u. states that she has some numbness and light headness.     HISTORY OF PRESENT ILLNESS: Today 12/03/18 Grace [name] is a 58 y.o. female here today for follow up for MS. She is not currently on DMT. She continues gabapentin 300mg  daily as needed, Vicodin 5-325 TID PRN, etodolac 500mg  1-2 times daily and tizanidine 2mg  QID as needed for pain. Klonopin 1mg  at bedtime helps with sleep. She is also taking doxepin. She does not feel that she is sleeping well. She reports that over the past month she has had a really hard time. Over the past couple of days she has not been as alert as usual. She feels tired. She has also noted numbness of bilateral pinky fingers just on the medial edge from the middle of the finger down. No other numbness. She reports continued generalized weakness. She has had some blurry vision when she is tired. She denies changes in bowel or bladder habits.    HISTORY: (copied from Dr Garth Bigness note on 06/01/2018)  Grace [name] is a 58 y.o. woman with multiple sclerosis.     Update 06/01/2018: She feels her MS is stable.   She has not had any new symptoms since the last visit but had an exacerbation May 2019 (spinal).   She feels she has recovered.   She went on Aubagio but had LFT elevation and stopped.    Many years ago she was on Betaseron and Copaxone.    She was having propriospinal myoclonus and a Lhermitte sign at the onset of her exacerbation last year.    The myoclonus is better but the Lhermitte is the same.     She has right eye blurriness and was found to have an epiretinal membrane OU.    She also has had epidural steroid injections x 2 at [facility].  She recently got braces and notes more snoring.   She has had PSG in the past.  One had mild OSA and another was normal.     Update  09/28/2017: At the last visit, after going many years without an exacerbation and being off of disease modifying therapies for long time, she had the onset of right-sided numbness and clumsiness and was found on MRI to have an acute lesion at C6-C7 (and 2 older lesions at C5 and T2) Aubagio was prescribed but she has not yet started due to insurance and co-pay issues.    She still has an intermittent Lhermitte sign and has some jerks in her limbs when drowsy.   The alprazolam did not help the jerks.      She is still noting a hot sensation all over with sweating and associated with elevated BP lasting 10-90 minutes.    These occur more at night.     She first noted this in February and when she saw Ortho, she was prescribed a steroid pack.    A few days after the steroid, symptoms recurred.    She was having more blurry vision and saw Dr. Hassell [facility] at [facility].   A macular buckle was seen and she is being referred to a retina specialist.    Update 09/15/2017: In the past, she was on Betaseron and Copaxone.   She felt crazy on Copaxone and had trouble proessing.  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   She was diagnosed after ON in 1997 and had her big exacerbation in 2002 (C5).  She has had no DMT  Currently, she has numbness and clumsiness on the right.  The numbness has become very painful recently.  She feels it in both hands and feet but it is worse on the right.  When she is drowsy she gets jerks in bed.     She also notes a Lhermitte sign.   She feels her sleep is worse in general with insomnia.  I personally reviewed the MRI of the cervical spine from 06/24/2017.  It shows 2 old plaques, 1 posteriorly adjacent to C5 and another adjacent to T2.  Additionally there is a plaque adjacent to C6-C7 in the right posterior spinal cord.  There is subtle enhancement consistent with a more acute lesion.  This focus was not present on the  previous MRI from 2016.   Update 04/28/2017:   She remains on a disease modifying therapy for her MS. She has not noted any definite exacerbations recently her fatigue is worse. MRIs have shown a few predominantly nonspecific white matter foci and there has been no change from 2013-2018. Although there are only a couple nonspecific foci of the brain, she does have a couple foci within the spinal cord prompting her diagnosis.]  Her gait is stable. She stumbles some but has no falls. Strength is fine. She often notes spasticity in the legs. Baclofen and tizanidine has helped some. She also has some burning dysesthetic pain in feet, and to a lesser extent in the hands. Unfortunately, she did not get much benefit from multiple medications including nortriptyline, gabapentin, Trileptal, Lamictal, Lyrica and tramadol.    She notes blurry vision has worsened, right worse than left.  Colors are desaturated, more on the right.   Vision seems better in cold temperatures.  . Bladder function is fine.  Reports a lot of fatigue. She was on phentermine but had mild side effects and stopped.   She thinks it may have helped slightly.    Depression has done better on Lexapro. Depression worsened when her husband had a stroke in 2015.   From 11/08/2016: MS:   She denies recent exacerbation But notes more left facial pain and some vision blurring at times..    She was on Betaseron and Copaxone in the past.   She has not wanted to try any of the oral agents or IV drugs.   Her last MRI 2/18 and was unchanged from the previous 2 MRIs (2016 2013) and just shows a few fairly nonspecific foci.  She is noting more left facial pain, right facial pain near the ear and lower back.    MRI of the brain 06/2016 showed a few stable T2 spots which is fairly non-specific and unchanged compared to her previous MRI form 2016.     Facial Pain:   She was experiencing facial pain in the left cheek that fluctuated.  We started Keppra  and it helped some.  \ Lower back pain:   She sees Dr. Lynann Bologna for her lower back pain and has had a bilateral L5S1 facet block.  She felt pain was better for a couple days but then pain increased when she caught her husband's fall a day later.  Pain has continued.  Gait/strength/sensation:  Gait is stable with some stumbles but no recent falls.   She notes a  mild limp (but right hip has been hurting more).    Strength is fine.  She has mild spasticity, helped by baclofen and tizanidine (she alternates).     She has burning dysesthetic pain in the front of the legs and feet and in the the hands (lLeft = Right).   Nortriptyline, gabapentin and trileptal were not effective and Lamictal caused a rash.   Lyrica was not tolerated .   Tramadol was not well tolerated.    She reports mild left leg weakness (old) but notes her gait is slightly clumsy.    Spasticity is helped with soma, tizanidine and baclofen.   Vision:  She also is noting left eye pain and blurry vision but her eye exam was normal by ophthalmology.        Bladder: She notes urinary urgency and frequency.  Fatigue/sleep:   She reports a lot of physical and cognitive fatigue. Ritalin and Provigil have not helped her any. She also has sleep onset and sleep maintenance insomnia.   She takes muscle relaxants and doxepin at bedtime. She was on CPAP years ago but later PSG did not show OSA . Her periodic limb movements of sleep were improved with gabapentin.       Mood/cognition:  She feels her to depression has been worse. Her husband is disabled and she feels mood has been worse since his stroke. She has mild anxiety.   She was once on SSRI it is not sure if it helped her much.   She notes mild cognitive dysfunction.   She reports reduced short term memory and verbal fluency.   Ritalin did not help cognition much .     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 She had optic neuritis in 1999.  In 2002, she had right-sided numbness. MRIs of the brain show just a couple of nonspecific foci. A cervical spine MRI showed a focus consistent with demyelination at C5. Additionally, there are 2 smaller foci at T2 and T3-T4.  CSF was also abnormal c/w MS.     She was initially placed on Betaseron and then on Copaxone. She was unable to tolerate either of these and has not been on any DMT medication since 2003.   She retried Betaseron but stopped after diagnosis of avascular necrosis requiring hip replacement.    Of note, she had had only one course of IV steroids prior to that and has had some afterwards.   She had not wanted to go on Tysabri or Gilenya and opted to go on no disease modifying therapy since 2012. At her last visit on 01/17/2014, she decided to go on Aubagio but never started.     Most recent MRIs are from earlier this year and showed no changes.   REVIEW OF SYSTEMS: Out of a complete 14 system review of symptoms, the patient complains only of the following symptoms, weakness, numbness, headaches, and all other reviewed systems are negative.  ALLERGIES: Allergies  Allergen Reactions  . Lamictal [Lamotrigine] Shortness Of Breath, Itching and Swelling    Tongue swells  . Mobic [Meloxicam] Anaphylaxis  . Ultram [Tramadol Hcl] Anaphylaxis  . Codeine Itching  . Diclofenac Itching  . Latex     Latex band-aids cause redness and tears your skin  . Teriflunomide Diarrhea    Hair loss, joint pains, elevated liver enzymes abugio (joint pains and liver issues)  . Tape Rash    HOME  MEDICATIONS: Outpatient Medications Prior to Visit  Medication Sig Dispense Refill  . Ascorbic Acid (VITAMIN C) 1000 MG tablet Take 1,000 mg by mouth daily.    [name] Kitchen aspirin EC 81 MG tablet Take 81 mg by mouth daily.    . baclofen (LIORESAL) 10 MG tablet TAKE 2 TABLETS (20 MG TOTAL) BY MOUTH 3 (THREE) TIMES DAILY. 180 tablet 11  . calcium-vitamin D (OSCAL WITH D) 500-200 MG-UNIT tablet Take 1 tablet by mouth.     . clonazePAM (KLONOPIN) 1 MG tablet Take 1 tablet (1 mg total) by mouth at bedtime. 30 tablet 0  . diphenhydrAMINE (BENADRYL) 25 MG tablet Take 25 mg by mouth 3 (three) times daily.    [name] Kitchen etodolac (LODINE) 500 MG tablet Take 500 mg by mouth. 1-2 times a day    . gabapentin (NEURONTIN) 300 MG capsule Take 1 capsule (300 mg total) by mouth 3 (three) times daily. 90 capsule 11  . Grape Seed 100 MG CAPS Take by mouth.    [name] Kitchen HYDROcodone-acetaminophen (NORCO/VICODIN) 5-325 MG tablet Take 1 tablet by mouth 3 (three) times daily as needed for moderate pain. 90 tablet 0  . Lactobacillus (PROBIOTIC ACIDOPHILUS PO) Take by mouth.    . levocetirizine (XYZAL) 5 MG tablet Take 1 tablet (5 mg total) by mouth every evening. 90 tablet 1  . Melatonin 5 MG TABS Take 2 tablets by mouth at bedtime.    . mometasone (NASONEX) 50 MCG/ACT nasal spray Place 2 sprays into the nose daily as needed (allergies).    . QC LORATADINE-D 10-240 MG 24 hr tablet TAKE 1 TABLET BY MOUTH DAILY. 30 tablet 6  . rizatriptan (MAXALT-MLT) 10 MG disintegrating tablet Take 1 tablet (10 mg total) by mouth as needed for migraine. May repeat in 2 hours if needed.  Max of 2 tablets in 24 hours, and 10 tablets per month 10 tablet 11  . tiZANidine (ZANAFLEX) 2 MG tablet TAKE 1 TABLET (2 MG TOTAL) BY MOUTH EVERY 6 (SIX) HOURS AS NEEDED FOR MUSCLE SPASMS. 120 tablet 11  . Turmeric 1053 MG TABS Take 1,053 mg by mouth daily.    [name] Kitchen VITAMIN D PO Take by mouth daily.    . vitamin E 400 UNIT capsule Take 400 Units by mouth daily.    [name] Kitchen zolpidem (AMBIEN) 10 MG tablet Take 10 mg by mouth at bedtime as needed. For sleep    . doxepin (SINEQUAN) 10 MG capsule TAKE 1 OR 2 CAPSULES BY MOUTH AT BEDTIME 60 capsule 11   No facility-administered medications prior to visit.     PAST MEDICAL HISTORY: Past Medical History:  Diagnosis Date  . Allergy   . Anxiety   . Avascular necrosis ([place])   . Colon polyp    pre-cancerous  . Complication of anesthesia     patient states she requires more anesthesia  . Disorder of soft tissue   . Diverticulosis   . GERD (gastroesophageal reflux disease)   . H/O breast biopsy    pre- cancerous cells  . Hand fracture, left    9/87  . Heart palpitations   . History of Holter monitoring   . Hyperlipidemia   . Lhermitte's syndrome   . Migraines   . Multiple sclerosis ([place])    dx in 11/1996  . Multiple sclerosis ([place])   . Neuromuscular disorder ([place])   . Neuropathy   . Numbness and tingling in hands   . Obesity   . OSA (obstructive sleep apnea)    no  longer have OSA since stopped taking a MS medication  . Otitis media   . Raynaud's phenomenon   . Sleep apnea    Due to medication that she no longer takes.  . Vaginitis and vulvovaginitis   . Vitamin D deficiency     PAST SURGICAL HISTORY: Past Surgical History:  Procedure Laterality Date  . ABDOMINAL HYSTERECTOMY Bilateral 02/12/2016   Procedure: HYSTERECTOMY ABDOMINAL WITH BILATERAL SALPINGO OOPHERECTOMY;  Surgeon: Dian Queen, MD;  Location: [facility];  Service: Gynecology;  Laterality: Bilateral;  . BREAST BIOPSY     core/left breast  . CHOLECYSTECTOMY    . COLONOSCOPY     1997/2001  . DILATION AND CURETTAGE OF UTERUS    . ENDOMETRIAL ABLATION    . LAPAROSCOPIC ABDOMINAL EXPLORATION     ovaries and intestines bond together  . LEFT HEART CATHETERIZATION WITH CORONARY ANGIOGRAM N/A 06/21/2011   Procedure: LEFT HEART CATHETERIZATION WITH CORONARY ANGIOGRAM;  Surgeon: Leonie Man, MD;  Location: [facility] CATH LAB;  Service: Cardiovascular;  Laterality: N/A;  . LUMBAR PUNCTURE     11/01/1996  . SALPINGOOPHORECTOMY Bilateral 02/12/2016   Procedure: BILATERAL SALPINGO OOPHORECTOMY;  Surgeon: Dian Queen, MD;  Location: [facility];  Service: Gynecology;  Laterality: Bilateral;  . TOTAL HIP ARTHROPLASTY     left hip  6/02  /right hip12/04    FAMILY HISTORY: Family History  Problem Relation Age of Onset  . Pancreatic cancer Mother   . Stroke  Mother   . Testicular cancer Brother   . Colon cancer Maternal Uncle   . Pancreatic cancer Paternal Uncle   . Prostate cancer Maternal Grandfather   . Colon polyps Father     SOCIAL HISTORY: Social History   Socioeconomic History  . Marital status: Married    Spouse name: Not on file  . Number of children: Not on file  . Years of education: Not on file  . Highest education level: Not on file  Occupational History  . Occupation: disabled  Social Needs  . Financial resource strain: Not hard at all  . Food insecurity    Worry: Never true    Inability: Never true  . Transportation needs    Medical: No    Non-medical: No  Tobacco Use  . Smoking status: Former Smoker    Types: Cigarettes    Quit date: 07/01/1995    Years since quitting: 23.4  . Smokeless tobacco: Never Used  Substance and Sexual Activity  . Alcohol use: No    Alcohol/week: 0.0 standard drinks  . Drug use: No  . Sexual activity: Not Currently  Lifestyle  . Physical activity    Days per week: 0 days    Minutes per session: 0 min  . Stress: Not at all  Relationships  . Social Herbalist on phone: Not on file    Gets together: Not on file    Attends religious service: Not on file    Active member of club or organization: Not on file    Attends meetings of clubs or organizations: Not on file    Relationship status: Not on file  . Intimate partner violence    Fear of current or ex partner: No    Emotionally abused: No    Physically abused: No    Forced sexual activity: No  Other Topics Concern  . Not on file  Social History Narrative  . Not on file      PHYSICAL EXAM  Vitals:   11/30/18  1320  BP: (!) 148/91  Pulse: 98  Temp: 98 F (36.7 C)  Weight: 218 lb 9.6 oz (99.2 kg)  Height: 5\' 6"  (1.676 m)   Body mass index is 35.28 kg/m.  Generalized: Well developed, in no acute distress  Cardiology: normal rate and rhythm, no murmur noted Neurological examination  Mentation: Alert  oriented to time, place, history taking. Follows all commands speech and language fluent Cranial nerve II-XII: Pupils were equal round reactive to light. Extraocular movements were full, visual field were full on confrontational test. Facial sensation and strength were normal. Uvula tongue midline. Head turning and shoulder shrug  were normal and symmetric. Motor: The motor testing reveals 5 over 5 strength of all 4 extremities. Good symmetric motor tone is noted throughout.  Sensory: Sensory testing is intact to soft touch on all 4 extremities. No evidence of extinction is noted. Patient reports numbness of medial aspect of bilateral fifth digit from PIP joint distally  Coordination: Cerebellar testing reveals good finger-nose-finger and heel-to-shin bilaterally.  Gait and station: Gait is normal. Tandem gait is normal. Romberg is negative. No drift is seen.    DIAGNOSTIC DATA (LABS, IMAGING, TESTING) - I reviewed patient records, labs, notes, testing and imaging myself where available.  No flowsheet data found.   Lab Results  Component Value Date   WBC 7.5 04/27/2018   HGB 13.6 04/27/2018   HCT 38.8 04/27/2018   MCV 91 04/27/2018   PLT 327 04/27/2018      Component Value Date/Time   NA 143 04/27/2018 1208   K 3.9 04/27/2018 1208   CL 100 04/27/2018 1208   CO2 25 04/27/2018 1208   GLUCOSE 95 04/27/2018 1208   GLUCOSE 120 (H) 02/13/2016 0459   BUN 12 04/27/2018 1208   CREATININE 0.70 04/27/2018 1208   CALCIUM 9.5 04/27/2018 1208   PROT 6.4 04/27/2018 1208   ALBUMIN 4.4 04/27/2018 1208   AST 43 (H) 04/27/2018 1208   ALT 42 (H) 04/27/2018 1208   ALKPHOS 93 04/27/2018 1208   BILITOT 0.6 04/27/2018 1208   GFRNONAA 98 04/27/2018 1208   GFRAA 113 04/27/2018 1208   No results found for: CHOL, HDL, LDLCALC, LDLDIRECT, TRIG, CHOLHDL No results found for: HGBA1C No results found for: VITAMINB12 Lab Results  Component Value Date   TSH 1.930 04/27/2018     ASSESSMENT AND  PLAN 58 y.o. year old female  has a past medical history of Allergy, Anxiety, Avascular necrosis ([place]), Colon polyp, Complication of anesthesia, Disorder of soft tissue, Diverticulosis, GERD (gastroesophageal reflux disease), H/O breast biopsy, Hand fracture, left, Heart palpitations, History of Holter monitoring, Hyperlipidemia, Lhermitte's syndrome, Migraines, Multiple sclerosis ([facility]), Multiple sclerosis ([place]), Neuromuscular disorder ([place]), Neuropathy, Numbness and tingling in hands, Obesity, OSA (obstructive sleep apnea), Otitis media, Raynaud's phenomenon, Sleep apnea, Vaginitis and vulvovaginitis, and Vitamin D deficiency. here with     ICD-10-CM   1. Multiple sclerosis ([place])  G35     Dorri is fairly stable overall. She does have concerns of new numbness to bilateral fifth digits and increased insomnia. After consultation with Dr Felecia Shelling, we will treat with prednisone taper pack and will change doxepin to trazodone 100mg  nightly. She was educated on plan and verbalizes understanding. She was encouraged to continue regular activity and stay well hydrated. She will follow up in 3 months with Dr Felecia Shelling, sooner if needed.    No orders of the defined types were placed in this encounter.    Meds ordered this encounter  Medications  .  predniSONE (DELTASONE) 20 MG tablet    Sig: 120mg  x 1 day, 100mg  x 1 day, 80mg  x 1 day, 60mg  x 1 day, 40mg  x 1 day then 20mg  x 1 day.    Dispense:  21 tablet    Refill:  0    Order Specific Question:   Supervising Provider    Answer:   Melvenia Beam [id number]  . traZODone (DESYREL) 100 MG tablet    Sig: Take 1 tablet (100 mg total) by mouth at bedtime.    Dispense:  90 tablet    Refill:  3    Order Specific Question:   Supervising Provider    Answer:   Melvenia Beam [id number]      I spent 15 minutes with the patient. 50% of this time was spent counseling and educating patient on plan of care and medications.    Debbora Presto, FNP-C 12/03/2018, 10:25  AM [facility] [address] [place], [place] [address] [phone]

## 2018-12-03 ENCOUNTER — Encounter: Payer: Self-pay | Admitting: Family Medicine

## 2018-12-04 NOTE — Progress Notes (Signed)
I have read the note, and I agree with the clinical assessment and plan.  Bram Hottel A. Felecia Shelling, MD, PhD, [facility] Certified in Neurology, Clinical Neurophysiology, Sleep Medicine, Pain Medicine and Neuroimaging  [facility] [address] [place], [place] [address] [phone] v

## 2018-12-28 ENCOUNTER — Other Ambulatory Visit: Payer: Self-pay

## 2018-12-29 ENCOUNTER — Telehealth: Payer: Self-pay | Admitting: Internal Medicine

## 2018-12-29 NOTE — Telephone Encounter (Signed)
I left a message asking the patient to call and reschedule December AWV/CPE appointments. VDM (DD)

## 2019-01-01 MED ORDER — HYDROCODONE-ACETAMINOPHEN 5-325 MG PO TABS: 1.0000 | ORAL_TABLET | Freq: Three times a day (TID) | ORAL | 0 refills | Status: DC | PRN

## 2019-01-02 ENCOUNTER — Telehealth: Payer: Self-pay | Admitting: Family Medicine

## 2019-01-02 NOTE — Telephone Encounter (Signed)
Grace [name] from [facility] called needing the DX code for the pt's HYDROcodone-acetaminophen (NORCO/VICODIN) 5-325 MG tablet  Please advise.

## 2019-01-02 NOTE — Telephone Encounter (Signed)
I called stephanie at [facility].  She needed diagnosis for pt for documentation of why she is getting hydrocodone.  I relayed that we see for Multiple sclerosis, G35.  She verbalized understanding.

## 2019-01-03 ENCOUNTER — Ambulatory Visit (INDEPENDENT_AMBULATORY_CARE_PROVIDER_SITE_OTHER): Payer: Medicare Other | Admitting: Internal Medicine

## 2019-01-03 ENCOUNTER — Other Ambulatory Visit: Payer: Self-pay

## 2019-01-03 ENCOUNTER — Encounter: Payer: Self-pay | Admitting: Internal Medicine

## 2019-01-03 VITALS — BP 128/92 | HR 97 | Temp 97.4°F | Ht 66.0 in | Wt 221.6 lb

## 2019-01-03 DIAGNOSIS — E559 Vitamin D deficiency, unspecified: Secondary | ICD-10-CM | POA: Diagnosis not present

## 2019-01-03 DIAGNOSIS — Z6836 Body mass index (BMI) 36.0-36.9, adult: Secondary | ICD-10-CM

## 2019-01-03 DIAGNOSIS — R1032 Left lower quadrant pain: Secondary | ICD-10-CM

## 2019-01-03 DIAGNOSIS — R1013 Epigastric pain: Secondary | ICD-10-CM

## 2019-01-03 DIAGNOSIS — L309 Dermatitis, unspecified: Secondary | ICD-10-CM | POA: Diagnosis not present

## 2019-01-03 DIAGNOSIS — R109 Unspecified abdominal pain: Secondary | ICD-10-CM | POA: Diagnosis not present

## 2019-01-03 MED ORDER — CEFTRIAXONE SODIUM 500 MG IJ SOLR
500.0000 mg | Freq: Once | INTRAMUSCULAR | Status: AC
  Administered 2019-01-03: 16:00:00 500 mg via INTRAMUSCULAR

## 2019-01-03 MED ORDER — FAMOTIDINE 20 MG PO TABS: 20.0000 mg | ORAL_TABLET | Freq: Two times a day (BID) | ORAL | 1 refills | Status: DC

## 2019-01-04 ENCOUNTER — Other Ambulatory Visit: Payer: Self-pay | Admitting: Internal Medicine

## 2019-01-04 ENCOUNTER — Encounter: Payer: Self-pay | Admitting: Internal Medicine

## 2019-01-04 LAB — CBC WITH DIFFERENTIAL/PLATELET
Basophils Absolute: 0.1 10*3/uL (ref 0.0–0.2)
Basos: 1 %
EOS (ABSOLUTE): 0.2 10*3/uL (ref 0.0–0.4)
Eos: 3 %
Hematocrit: 39.9 % (ref 34.0–46.6)
Hemoglobin: 13.6 g/dL (ref 11.1–15.9)
Immature Grans (Abs): 0 10*3/uL (ref 0.0–0.1)
Immature Granulocytes: 0 %
Lymphocytes Absolute: 2.7 10*3/uL (ref 0.7–3.1)
Lymphs: 39 %
MCH: 31.9 pg (ref 26.6–33.0)
MCHC: 34.1 g/dL (ref 31.5–35.7)
MCV: 93 fL (ref 79–97)
Monocytes Absolute: 0.8 10*3/uL (ref 0.1–0.9)
Monocytes: 11 %
Neutrophils Absolute: 3.2 10*3/uL (ref 1.4–7.0)
Neutrophils: 46 %
Platelets: 368 10*3/uL (ref 150–450)
RBC: 4.27 x10E6/uL (ref 3.77–5.28)
RDW: 11.9 % (ref 11.7–15.4)
WBC: 6.9 10*3/uL (ref 3.4–10.8)

## 2019-01-04 LAB — CMP14+EGFR
ALT: 29 IU/L (ref 0–32)
AST: 29 IU/L (ref 0–40)
Albumin/Globulin Ratio: 1.9 (ref 1.2–2.2)
Albumin: 4.5 g/dL (ref 3.8–4.9)
Alkaline Phosphatase: 104 IU/L (ref 39–117)
BUN/Creatinine Ratio: 22 (ref 9–23)
BUN: 20 mg/dL (ref 6–24)
Bilirubin Total: 0.7 mg/dL (ref 0.0–1.2)
CO2: 21 mmol/L (ref 20–29)
Calcium: 9.8 mg/dL (ref 8.7–10.2)
Chloride: 99 mmol/L (ref 96–106)
Creatinine, Ser: 0.91 mg/dL (ref 0.57–1.00)
GFR calc Af Amer: 82 mL/min/{1.73_m2} (ref >59)
GFR calc non Af Amer: 71 mL/min/{1.73_m2} (ref >59)
Globulin, Total: 2.4 g/dL (ref 1.5–4.5)
Glucose: 97 mg/dL (ref 65–99)
Potassium: 4.2 mmol/L (ref 3.5–5.2)
Sodium: 139 mmol/L (ref 134–144)
Total Protein: 6.9 g/dL (ref 6.0–8.5)

## 2019-01-04 LAB — VITAMIN D 25 HYDROXY (VIT D DEFICIENCY, FRACTURES): Vit D, 25-Hydroxy: 32.1 ng/mL (ref 30.0–100.0)

## 2019-01-04 MED ORDER — AMOXICILLIN-POT CLAVULANATE 875-125 MG PO TABS: 1.0000 | ORAL_TABLET | Freq: Two times a day (BID) | ORAL | 0 refills | Status: AC

## 2019-01-05 ENCOUNTER — Other Ambulatory Visit: Payer: Self-pay | Admitting: Internal Medicine

## 2019-01-05 MED ORDER — VITAMIN D (ERGOCALCIFEROL) 1.25 MG (50000 UNIT) PO CAPS: ORAL_CAPSULE | ORAL | 0 refills | Status: DC

## 2019-01-12 ENCOUNTER — Encounter: Payer: Self-pay | Admitting: Internal Medicine

## 2019-01-28 NOTE — Progress Notes (Signed)
Subjective:     Patient ID: Grace [name] , female    DOB: [birth year] , 58 y.o.   MRN: [id number]   Chief Complaint  Patient presents with  . Abdominal Pain    HPI  She is here today for further evaluation of abdominal pain and low back pain.    Abdominal Pain This is a new problem. The current episode started in the past 7 days. The onset quality is gradual. The problem occurs constantly. The problem has been unchanged. The pain is located in the LLQ and left flank. The pain is at a severity of 7/10. The pain is moderate. The quality of the pain is dull and aching. The abdominal pain radiates to the left flank. Associated symptoms include belching. Pertinent negatives include no constipation, frequency, headaches or nausea. The pain is relieved by nothing.     Past Medical History:  Diagnosis Date  . Allergy   . Anxiety   . Avascular necrosis ([place])   . Colon polyp    pre-cancerous  . Complication of anesthesia    patient states she requires more anesthesia  . Disorder of soft tissue   . Diverticulosis   . GERD (gastroesophageal reflux disease)   . H/O breast biopsy    pre- cancerous cells  . Hand fracture, left    9/87  . Heart palpitations   . History of Holter monitoring   . Hyperlipidemia   . Lhermitte's syndrome   . Migraines   . Multiple sclerosis ([place])    dx in 11/1996  . Multiple sclerosis ([place])   . Neuromuscular disorder ([place])   . Neuropathy   . Numbness and tingling in hands   . Obesity   . OSA (obstructive sleep apnea)    no longer have OSA since stopped taking a MS medication  . Otitis media   . Raynaud's phenomenon   . Sleep apnea    Due to medication that she no longer takes.  . Vaginitis and vulvovaginitis   . Vitamin D deficiency      Family History  Problem Relation Age of Onset  . Pancreatic cancer Mother   . Stroke Mother   . Testicular cancer Brother   . Colon cancer Maternal Uncle   . Pancreatic cancer Paternal Uncle   . Prostate  cancer Maternal Grandfather   . Colon polyps Father      Current Outpatient Medications:  .  Ascorbic Acid (VITAMIN C) 1000 MG tablet, Take 1,000 mg by mouth daily., Disp: , Rfl:  .  aspirin EC 81 MG tablet, Take 81 mg by mouth daily., Disp: , Rfl:  .  baclofen (LIORESAL) 10 MG tablet, TAKE 2 TABLETS (20 MG TOTAL) BY MOUTH 3 (THREE) TIMES DAILY., Disp: 180 tablet, Rfl: 11 .  calcium-vitamin D (OSCAL WITH D) 500-200 MG-UNIT tablet, Take 1 tablet by mouth., Disp: , Rfl:  .  clonazePAM (KLONOPIN) 1 MG tablet, Take 1 tablet (1 mg total) by mouth at bedtime., Disp: 30 tablet, Rfl: 0 .  diphenhydrAMINE (BENADRYL) 25 MG tablet, Take 25 mg by mouth 3 (three) times daily., Disp: , Rfl:  .  etodolac (LODINE) 500 MG tablet, Take 500 mg by mouth. 1-2 times a day, Disp: , Rfl:  .  gabapentin (NEURONTIN) 300 MG capsule, Take 1 capsule (300 mg total) by mouth 3 (three) times daily., Disp: 90 capsule, Rfl: 11 .  Grape Seed 100 MG CAPS, Take by mouth., Disp: , Rfl:  .  HYDROcodone-acetaminophen (NORCO/VICODIN) 5-325 MG  tablet, Take 1 tablet by mouth 3 (three) times daily as needed for moderate pain., Disp: 90 tablet, Rfl: 0 .  Lactobacillus (PROBIOTIC ACIDOPHILUS PO), Take by mouth., Disp: , Rfl:  .  levocetirizine (XYZAL) 5 MG tablet, Take 1 tablet (5 mg total) by mouth every evening., Disp: 90 tablet, Rfl: 1 .  Melatonin 5 MG TABS, Take 2 tablets by mouth at bedtime., Disp: , Rfl:  .  mometasone (NASONEX) 50 MCG/ACT nasal spray, Place 2 sprays into the nose daily as needed (allergies)., Disp: , Rfl:  .  QC LORATADINE-D 10-240 MG 24 hr tablet, TAKE 1 TABLET BY MOUTH DAILY., Disp: 30 tablet, Rfl: 6 .  rizatriptan (MAXALT-MLT) 10 MG disintegrating tablet, Take 1 tablet (10 mg total) by mouth as needed for migraine. May repeat in 2 hours if needed.  Max of 2 tablets in 24 hours, and 10 tablets per month, Disp: 10 tablet, Rfl: 11 .  tiZANidine (ZANAFLEX) 2 MG tablet, TAKE 1 TABLET (2 MG TOTAL) BY MOUTH EVERY 6  (SIX) HOURS AS NEEDED FOR MUSCLE SPASMS., Disp: 120 tablet, Rfl: 11 .  Turmeric 1053 MG TABS, Take 1,053 mg by mouth daily., Disp: , Rfl:  .  VITAMIN D PO, Take by mouth daily., Disp: , Rfl:  .  vitamin E 400 UNIT capsule, Take 400 Units by mouth daily., Disp: , Rfl:  .  famotidine (PEPCID) 20 MG tablet, Take 1 tablet (20 mg total) by mouth 2 (two) times daily., Disp: 60 tablet, Rfl: 1 .  Vitamin D, Ergocalciferol, (DRISDOL) 1.25 MG (50000 UT) CAPS capsule, One capsule po twice weekly on Tuesdays/Fridays x 2 months, then once weekly, Disp: 24 capsule, Rfl: 0 .  zolpidem (AMBIEN) 10 MG tablet, Take 10 mg by mouth at bedtime as needed. For sleep, Disp: , Rfl:    Allergies  Allergen Reactions  . Lamictal [Lamotrigine] Shortness Of Breath, Itching and Swelling    Tongue swells  . Mobic [Meloxicam] Anaphylaxis  . Ultram [Tramadol Hcl] Anaphylaxis  . Codeine Itching  . Diclofenac Itching  . Latex     Latex band-aids cause redness and tears your skin  . Teriflunomide Diarrhea    Hair loss, joint pains, elevated liver enzymes abugio (joint pains and liver issues)  . Trazodone And Nefazodone     Mouth sores, tongue swelling  . Tape Rash     Review of Systems  Constitutional: Negative.   Respiratory: Negative.   Cardiovascular: Negative.   Gastrointestinal: Positive for abdominal pain. Negative for constipation and nausea.  Genitourinary: Negative for frequency.  Neurological: Negative.  Negative for headaches.  Psychiatric/Behavioral: Negative.      Today's Vitals   01/03/19 1524  BP: (!) 128/92  Pulse: 97  Temp: (!) 97.4 F (36.3 C)  TempSrc: Oral  Weight: 221 lb 9.6 oz (100.5 kg)  Height: 5' 6" (1.676 m)  PainSc: 7   PainLoc: Abdomen   Body mass index is 35.77 kg/m.   Objective:  Physical Exam Vitals signs and nursing note reviewed.  Constitutional:      Appearance: Normal appearance.  HENT:     Head: Normocephalic and atraumatic.  Cardiovascular:     Rate and  Rhythm: Normal rate and regular rhythm.     Heart sounds: Normal heart sounds.  Pulmonary:     Effort: Pulmonary effort is normal.     Breath sounds: Normal breath sounds.  Abdominal:     General: Bowel sounds are normal.     Palpations: Abdomen is soft.  There is no mass.     Tenderness: There is abdominal tenderness. There is guarding.  Skin:    General: Skin is warm.     Comments: faint flesh colored rash on arms/legs  Neurological:     General: No focal deficit present.     Mental Status: She is alert.  Psychiatric:        Mood and Affect: Mood normal.        Behavior: Behavior normal.         Assessment And Plan:     1. LLQ pain  She was given rocephin for suspected diverticulitis. She was also given rx Augmentin to take twice daily. Also encouraged to complete full abx course.   - CMP14+EGFR - CBC with Diff - cefTRIAXone (ROCEPHIN) injection 500 mg  2. Acute left flank pain  I will check u/a if she is able to submit a urine sample.   3. Dermatitis  She will let me know if rash persists. She is encouraged to try OTC HC cream to affected area twice daily as needed.   4. Vitamin D deficiency disease  I WILL CHECK A VIT D LEVEL AND SUPPLEMENT AS NEEDED.  ALSO ENCOURAGED TO SPEND 15 MINUTES IN THE SUN DAILY.  - Vitamin D (25 hydroxy)  5. Dyspepsia  She was given rx famotidine 68m to take twice daily. She will rto in four to six weeks for re-evaluation.   6. Class 2 severe obesity due to excess calories with serious comorbidity and body mass index (BMI) of 36.0 to 36.9 in adult ([facility]  Importance of achieving optimal weight to decrease risk of cardiovascular disease and cancers was discussed with the patient in full detail. Importance of regular exercise was discussed with the patient.  She is encouraged to start slowly - start with 10 minutes twice daily at least three to four days per week and to gradually build to 30 minutes five days weekly. She was given tips  to incorporate more activity into her daily routine - take stairs when possible, park farther away from her job, grocery stores, etc.    RMaximino Greenland MD    THE PATIENT IS ENCOURAGED TO PRACTICE SOCIAL DISTANCING DUE TO THE COVID-19 PANDEMIC.

## 2019-01-29 ENCOUNTER — Encounter: Payer: Self-pay | Admitting: Internal Medicine

## 2019-01-29 ENCOUNTER — Other Ambulatory Visit: Payer: Self-pay | Admitting: Internal Medicine

## 2019-01-29 MED ORDER — FLUCONAZOLE 150 MG PO TABS: 150.0000 mg | ORAL_TABLET | Freq: Every day | ORAL | 0 refills | Status: DC

## 2019-01-30 ENCOUNTER — Other Ambulatory Visit: Payer: Self-pay

## 2019-01-30 MED ORDER — VITAMIN D (ERGOCALCIFEROL) 1.25 MG (50000 UNIT) PO CAPS: ORAL_CAPSULE | ORAL | 0 refills | Status: DC

## 2019-02-01 ENCOUNTER — Other Ambulatory Visit: Payer: Self-pay

## 2019-02-01 ENCOUNTER — Encounter: Payer: Self-pay | Admitting: Internal Medicine

## 2019-02-01 ENCOUNTER — Ambulatory Visit (INDEPENDENT_AMBULATORY_CARE_PROVIDER_SITE_OTHER): Payer: Medicare Other | Admitting: Internal Medicine

## 2019-02-01 VITALS — BP 132/78 | HR 95 | Temp 97.7°F | Ht 65.6 in | Wt 223.4 lb

## 2019-02-01 DIAGNOSIS — R102 Pelvic and perineal pain: Secondary | ICD-10-CM

## 2019-02-01 DIAGNOSIS — R300 Dysuria: Secondary | ICD-10-CM | POA: Diagnosis not present

## 2019-02-01 LAB — POCT URINALYSIS DIPSTICK
Bilirubin, UA: NEGATIVE
Glucose, UA: NEGATIVE
Nitrite, UA: NEGATIVE
Protein, UA: POSITIVE — AB
Spec Grav, UA: >=1.03 — AB (ref 1.010–1.025)
Urobilinogen, UA: 0.2 E.U./dL
pH, UA: 5.5 (ref 5.0–8.0)

## 2019-02-01 MED ORDER — NITROFURANTOIN MONOHYD MACRO 100 MG PO CAPS: 100.0000 mg | ORAL_CAPSULE | Freq: Two times a day (BID) | ORAL | 0 refills | Status: AC

## 2019-02-02 ENCOUNTER — Encounter: Payer: Self-pay | Admitting: Internal Medicine

## 2019-02-03 NOTE — Progress Notes (Signed)
Subjective:     Patient ID: Grace [name] , female    DOB: 1960-06-08 , 58 y.o.   MRN: [id number]   Chief Complaint  Patient presents with  . Vaginitis    unresolved     HPI  She is here today for further evaluation of persistent vaginal discharge/itching. She reports her sx started after taking abx for possible diverticulitis. She took diflucan without resolution of her sx. She also has dysuria. No fever/chills/abdominal pain.     Past Medical History:  Diagnosis Date  . Allergy   . Anxiety   . Avascular necrosis ([place])   . Colon polyp    pre-cancerous  . Complication of anesthesia    patient states she requires more anesthesia  . Disorder of soft tissue   . Diverticulosis   . GERD (gastroesophageal reflux disease)   . H/O breast biopsy    pre- cancerous cells  . Hand fracture, left    9/87  . Heart palpitations   . History of Holter monitoring   . Hyperlipidemia   . Lhermitte's syndrome   . Migraines   . Multiple sclerosis ([place])    dx in 11/1996  . Multiple sclerosis ([place])   . Neuromuscular disorder ([place])   . Neuropathy   . Numbness and tingling in hands   . Obesity   . OSA (obstructive sleep apnea)    no longer have OSA since stopped taking a MS medication  . Otitis media   . Raynaud's phenomenon   . Sleep apnea    Due to medication that she no longer takes.  . Vaginitis and vulvovaginitis   . Vitamin D deficiency      Family History  Problem Relation Age of Onset  . Pancreatic cancer Mother   . Stroke Mother   . Testicular cancer Brother   . Colon cancer Maternal Uncle   . Pancreatic cancer Paternal Uncle   . Prostate cancer Maternal Grandfather   . Colon polyps Father      Current Outpatient Medications:  .  Ascorbic Acid (VITAMIN C) 1000 MG tablet, Take 1,000 mg by mouth daily., Disp: , Rfl:  .  aspirin EC 81 MG tablet, Take 81 mg by mouth daily., Disp: , Rfl:  .  baclofen (LIORESAL) 10 MG tablet, TAKE 2 TABLETS (20 MG TOTAL) BY MOUTH 3  (THREE) TIMES DAILY., Disp: 180 tablet, Rfl: 11 .  calcium-vitamin D (OSCAL WITH D) 500-200 MG-UNIT tablet, Take 1 tablet by mouth., Disp: , Rfl:  .  clonazePAM (KLONOPIN) 1 MG tablet, Take 1 tablet (1 mg total) by mouth at bedtime., Disp: 30 tablet, Rfl: 0 .  diphenhydrAMINE (BENADRYL) 25 MG tablet, Take 25 mg by mouth 3 (three) times daily., Disp: , Rfl:  .  etodolac (LODINE) 500 MG tablet, Take 500 mg by mouth. 1-2 times a day, Disp: , Rfl:  .  famotidine (PEPCID) 20 MG tablet, Take 1 tablet (20 mg total) by mouth 2 (two) times daily., Disp: 60 tablet, Rfl: 1 .  gabapentin (NEURONTIN) 300 MG capsule, Take 1 capsule (300 mg total) by mouth 3 (three) times daily., Disp: 90 capsule, Rfl: 11 .  Grape Seed 100 MG CAPS, Take by mouth., Disp: , Rfl:  .  HYDROcodone-acetaminophen (NORCO/VICODIN) 5-325 MG tablet, Take 1 tablet by mouth 3 (three) times daily as needed for moderate pain., Disp: 90 tablet, Rfl: 0 .  Lactobacillus (PROBIOTIC ACIDOPHILUS PO), Take by mouth., Disp: , Rfl:  .  levocetirizine (XYZAL) 5 MG tablet,  Take 1 tablet (5 mg total) by mouth every evening., Disp: 90 tablet, Rfl: 1 .  Melatonin 5 MG TABS, Take 2 tablets by mouth at bedtime., Disp: , Rfl:  .  mometasone (NASONEX) 50 MCG/ACT nasal spray, Place 2 sprays into the nose daily as needed (allergies)., Disp: , Rfl:  .  QC LORATADINE-D 10-240 MG 24 hr tablet, TAKE 1 TABLET BY MOUTH DAILY., Disp: 30 tablet, Rfl: 6 .  rizatriptan (MAXALT-MLT) 10 MG disintegrating tablet, Take 1 tablet (10 mg total) by mouth as needed for migraine. May repeat in 2 hours if needed.  Max of 2 tablets in 24 hours, and 10 tablets per month, Disp: 10 tablet, Rfl: 11 .  tiZANidine (ZANAFLEX) 2 MG tablet, TAKE 1 TABLET (2 MG TOTAL) BY MOUTH EVERY 6 (SIX) HOURS AS NEEDED FOR MUSCLE SPASMS., Disp: 120 tablet, Rfl: 11 .  Turmeric 1053 MG TABS, Take 1,053 mg by mouth daily., Disp: , Rfl:  .  VITAMIN D PO, Take by mouth daily., Disp: , Rfl:  .  Vitamin D,  Ergocalciferol, (DRISDOL) 1.25 MG (50000 UT) CAPS capsule, One capsule po twice weekly on Tuesdays/Fridays x 2 months, then once weekly, Disp: 24 capsule, Rfl: 0 .  vitamin E 400 UNIT capsule, Take 400 Units by mouth daily., Disp: , Rfl:  .  zolpidem (AMBIEN) 10 MG tablet, Take 10 mg by mouth at bedtime as needed. For sleep, Disp: , Rfl:  .  fluconazole (DIFLUCAN) 150 MG tablet, Take 1 tablet (150 mg total) by mouth daily. (Patient not taking: Reported on 02/01/2019), Disp: 1 tablet, Rfl: 0 .  nitrofurantoin, macrocrystal-monohydrate, (MACROBID) 100 MG capsule, Take 1 capsule (100 mg total) by mouth 2 (two) times daily for 7 days., Disp: 14 capsule, Rfl: 0   Allergies  Allergen Reactions  . Lamictal [Lamotrigine] Shortness Of Breath, Itching and Swelling    Tongue swells  . Mobic [Meloxicam] Anaphylaxis  . Ultram [Tramadol Hcl] Anaphylaxis  . Codeine Itching  . Diclofenac Itching  . Latex     Latex band-aids cause redness and tears your skin  . Teriflunomide Diarrhea    Hair loss, joint pains, elevated liver enzymes abugio (joint pains and liver issues)  . Trazodone And Nefazodone     Mouth sores, tongue swelling  . Tape Rash     Review of Systems  Constitutional: Negative.   Respiratory: Negative.   Cardiovascular: Negative.   Gastrointestinal: Negative.   Genitourinary: Positive for dysuria and vaginal discharge.  Neurological: Negative.   Psychiatric/Behavioral: Negative.      Today's Vitals   02/01/19 0922  BP: 132/78  Pulse: 95  Temp: 97.7 F (36.5 C)  TempSrc: Oral  SpO2: 96%  Weight: 223 lb 6.4 oz (101.3 kg)  Height: 5' 5.6" (1.666 m)   Body mass index is 36.5 kg/m.   Objective:  Physical Exam Vitals signs and nursing note reviewed.  Constitutional:      Appearance: Normal appearance.  HENT:     Head: Normocephalic and atraumatic.  Cardiovascular:     Rate and Rhythm: Normal rate and regular rhythm.     Heart sounds: Normal heart sounds.  Pulmonary:      Effort: Pulmonary effort is normal.     Breath sounds: Normal breath sounds.  Genitourinary:    General: Normal vulva.     Exam position: Lithotomy position.     Vagina: Vaginal discharge present.     Uterus: Absent.   Skin:    General: Skin is warm.  Neurological:     General: No focal deficit present.     Mental Status: She is alert.  Psychiatric:        Mood and Affect: Mood normal.        Behavior: Behavior normal.         Assessment And Plan:     1. Vaginal pain  I will check Nuswab.   - NuSwab Vaginitis Plus (VG+) - POCT Urinalysis Dipstick (81002)  2. Dysuria  I will check urine culture. She was given rx nitrofurantoin and encouraged to take full abx course.   - Culture, Urine - POCT Urinalysis Dipstick (81002)        [name], MD    THE PATIENT IS ENCOURAGED TO PRACTICE SOCIAL DISTANCING DUE TO THE COVID-19 PANDEMIC.

## 2019-02-05 LAB — NUSWAB VAGINITIS PLUS (VG+)
Candida albicans, NAA: NEGATIVE
Candida glabrata, NAA: NEGATIVE
Chlamydia trachomatis, NAA: NEGATIVE
Neisseria gonorrhoeae, NAA: NEGATIVE
Trich vag by NAA: NEGATIVE

## 2019-02-06 ENCOUNTER — Other Ambulatory Visit: Payer: Self-pay | Admitting: Internal Medicine

## 2019-02-06 LAB — URINE CULTURE

## 2019-02-06 MED ORDER — CEPHALEXIN 500 MG PO CAPS: 500.0000 mg | ORAL_CAPSULE | Freq: Three times a day (TID) | ORAL | 0 refills | Status: DC

## 2019-02-15 ENCOUNTER — Other Ambulatory Visit: Payer: Self-pay

## 2019-02-19 MED ORDER — HYDROCODONE-ACETAMINOPHEN 5-325 MG PO TABS: 1.0000 | ORAL_TABLET | Freq: Three times a day (TID) | ORAL | 0 refills | Status: DC | PRN

## 2019-02-23 ENCOUNTER — Other Ambulatory Visit: Payer: Self-pay | Admitting: Internal Medicine

## 2019-02-27 ENCOUNTER — Other Ambulatory Visit: Payer: Self-pay | Admitting: Internal Medicine

## 2019-03-02 ENCOUNTER — Other Ambulatory Visit: Payer: Self-pay | Admitting: Internal Medicine

## 2019-03-02 ENCOUNTER — Telehealth: Payer: Self-pay | Admitting: Internal Medicine

## 2019-03-02 DIAGNOSIS — N39498 Other specified urinary incontinence: Secondary | ICD-10-CM

## 2019-03-02 DIAGNOSIS — R300 Dysuria: Secondary | ICD-10-CM

## 2019-03-02 DIAGNOSIS — G35 Multiple sclerosis: Secondary | ICD-10-CM

## 2019-03-02 NOTE — Telephone Encounter (Signed)
I left a message asking the patient to call me to schedule AWV with Pamala Hurry in either October or November before she sees Dr. Baird [facility] calendar year).

## 2019-03-06 ENCOUNTER — Encounter: Payer: Self-pay | Admitting: Neurology

## 2019-03-06 ENCOUNTER — Ambulatory Visit (INDEPENDENT_AMBULATORY_CARE_PROVIDER_SITE_OTHER): Payer: Medicare Other | Admitting: Neurology

## 2019-03-06 ENCOUNTER — Other Ambulatory Visit: Payer: Self-pay

## 2019-03-06 VITALS — BP 110/70 | HR 98 | Temp 97.5°F | Ht 65.6 in | Wt 222.5 lb

## 2019-03-06 DIAGNOSIS — F329 Major depressive disorder, single episode, unspecified: Secondary | ICD-10-CM | POA: Diagnosis not present

## 2019-03-06 DIAGNOSIS — G4700 Insomnia, unspecified: Secondary | ICD-10-CM | POA: Diagnosis not present

## 2019-03-06 DIAGNOSIS — G35 Multiple sclerosis: Secondary | ICD-10-CM | POA: Diagnosis not present

## 2019-03-06 DIAGNOSIS — R260 Ataxic gait: Secondary | ICD-10-CM

## 2019-03-06 DIAGNOSIS — F32A Depression, unspecified: Secondary | ICD-10-CM | POA: Insufficient documentation

## 2019-03-06 DIAGNOSIS — F418 Other specified anxiety disorders: Secondary | ICD-10-CM

## 2019-03-06 DIAGNOSIS — R5383 Other fatigue: Secondary | ICD-10-CM

## 2019-03-06 DIAGNOSIS — R208 Other disturbances of skin sensation: Secondary | ICD-10-CM

## 2019-03-06 MED ORDER — LORAZEPAM 1 MG PO TABS: 1.0000 mg | ORAL_TABLET | Freq: Every day | ORAL | 5 refills | Status: DC

## 2019-03-06 MED ORDER — SERTRALINE HCL 50 MG PO TABS: 50.0000 mg | ORAL_TABLET | Freq: Every day | ORAL | 5 refills | Status: DC

## 2019-03-06 NOTE — Progress Notes (Signed)
[facility]  PATIENT: PETE STRIANO DOB: Apr 24, 1961  REFERRING DOCTOR OR PCP:  [name] SOURCE: patient  _________________________________   HISTORICAL  CHIEF COMPLAINT:  Chief Complaint  Patient presents with  . Follow-up    RM 13 with husband. Last seen 11/30/2018. BP increasing per pt. Fatigue has worsened. Mood has worsened.   . Multiple Sclerosis    Not on DMT. Taking doxepin. Trazodone was ineffective.    HISTORY OF PRESENT ILLNESS:  Brandee Hopewell is a 58 y.o. woman with multiple sclerosis.     Update 03/06/19: She has MS and is not on a DMT.   She did have an exacerbation in 2019 but chose not to start a DMT.    She had optic neuritis near the onset.   She is needing to have cataract surgery and we discussed no contraindications from MS viewpoint.   SHe is walking about the same with mild reduced balance.  Leg strength is good.  She has numbness in her fingers and toes.    She rarely has a whole body jerk but less than last year.   She has Lhermitte sign.     She has had increased BP off/on.    Weight is stable.     She has trouble with insomnia, both sleep onset and maintenance.    She takes doxepin.   She takes zolpidem some nights with benefit.    She feels her depression is worse.  She notes a lot of stress with her husband's illness.  We discussed referral to behavioral health.  Update 06/01/2018: She feels her MS is stable.   She has not had any new symptoms since the last visit but had an exacerbation May 2019 (spinal).   She feels she has recovered.   She went on Aubagio but had LFT elevation and stopped.    Many years ago she was on Betaseron and Copaxone.    She was having propriospinal myoclonus and a Lhermitte sign at the onset of her exacerbation last year.    The myoclonus is better but the Lhermitte is the same.     She has right eye blurriness and was found to have an epiretinal membrane OU.    She also has had epidural steroid  injections x 2 at [facility].  She recently got braces and notes more snoring.   She has had PSG in the past.  One had mild OSA and another was normal.     Update 09/28/2017: At the last visit, after going many years without an exacerbation and being off of disease modifying therapies for long time, she had the onset of right-sided numbness and clumsiness and was found on MRI to have an acute lesion at C6-C7 (and 2 older lesions at C5 and T2) Aubagio was prescribed but she has not yet started due to insurance and co-pay issues.    She still has an intermittent Lhermitte sign and has some jerks in her limbs when drowsy.   The alprazolam did not help the jerks.      She is still noting a hot sensation all over with sweating and associated with elevated BP lasting 10-90 minutes.    These occur more at night.     She first noted this in February and when she saw Ortho, she was prescribed a steroid pack.    A few days after the steroid, symptoms recurred.    She was having more blurry vision and saw Dr. Hassell Done  at [facility].   A macular buckle was seen and she is being referred to a retina specialist.    Update 09/15/2017: In the past, she was on Betaseron and Copaxone.   She felt crazy on Copaxone and had trouble proessing.  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   She was diagnosed after ON in 1997 and had her big exacerbation in 2002 (C5).  She has had no DMT  Currently, she has numbness and clumsiness on the right.  The numbness has become very painful recently.  She feels it in both hands and feet but it is worse on the right.  When she is drowsy she gets jerks in bed.     She also notes a Lhermitte sign.   She feels her sleep is worse in general with insomnia.  I personally reviewed the MRI of the cervical spine from 06/24/2017.  It shows 2 old plaques, 1 posteriorly adjacent to C5 and another adjacent to T2.  Additionally  there is a plaque adjacent to C6-C7 in the right posterior spinal cord.  There is subtle enhancement consistent with a more acute lesion.  This focus was not present on the previous MRI from 2016.   Update 04/28/2017:   She remains on a disease modifying therapy for her MS. She has not noted any definite exacerbations recently her fatigue is worse. MRIs have shown a few predominantly nonspecific white matter foci and there has been no change from 2013-2018. Although there are only a couple nonspecific foci of the brain, she does have a couple foci within the spinal cord prompting her diagnosis.]  Her gait is stable. She stumbles some but has no falls. Strength is fine. She often notes spasticity in the legs. Baclofen and tizanidine has helped some. She also has some burning dysesthetic pain in feet, and to a lesser extent in the hands. Unfortunately, she did not get much benefit from multiple medications including nortriptyline, gabapentin, Trileptal, Lamictal, Lyrica and tramadol.    She notes blurry vision has worsened, right worse than left.  Colors are desaturated, more on the right.   Vision seems better in cold temperatures.  . Bladder function is fine.  Reports a lot of fatigue. She was on phentermine but had mild side effects and stopped.   She thinks it may have helped slightly.    Depression has done better on Lexapro. Depression worsened when her husband had a stroke in 2015.   From 11/08/2016: MS:   She denies recent exacerbation But notes more left facial pain and some vision blurring at times..    She was on Betaseron and Copaxone in the past.   She has not wanted to try any of the oral agents or IV drugs.   Her last MRI 2/18 and was unchanged from the previous 2 MRIs (2016 2013) and just shows a few fairly nonspecific foci.  She is noting more left facial pain, right facial pain near the ear and lower back.    MRI of the brain 06/2016 showed a few stable T2 spots which is fairly  non-specific and unchanged compared to her previous MRI form 2016.     Facial Pain:   She was experiencing facial pain in the left cheek that fluctuated.  We started Keppra and it helped some.  \ Lower back pain:   She sees Dr. Lynann Bologna for her lower back pain and has had a bilateral  L5S1 facet block.  She felt pain was better for a couple days but then pain increased when she caught her husband's fall a day later.  Pain has continued.  Gait/strength/sensation:  Gait is stable with some stumbles but no recent falls.   She notes a mild limp (but right hip has been hurting more).    Strength is fine.  She has mild spasticity, helped by baclofen and tizanidine (she alternates).     She has burning dysesthetic pain in the front of the legs and feet and in the the hands (lLeft = Right).   Nortriptyline, gabapentin and trileptal were not effective and Lamictal caused a rash.   Lyrica was not tolerated .   Tramadol was not well tolerated.    She reports mild left leg weakness (old) but notes her gait is slightly clumsy.    Spasticity is helped with soma, tizanidine and baclofen.   Vision:  She also is noting left eye pain and blurry vision but her eye exam was normal by ophthalmology.        Bladder: She notes urinary urgency and frequency.  Fatigue/sleep:   She reports a lot of physical and cognitive fatigue. Ritalin and Provigil have not helped her any. She also has sleep onset and sleep maintenance insomnia.   She takes muscle relaxants and doxepin at bedtime. She was on CPAP years ago but later PSG did not show OSA . Her periodic limb movements of sleep were improved with gabapentin.       Mood/cognition:  She feels her to depression has been worse. Her husband is disabled and she feels mood has been worse since his stroke. She has mild anxiety.   She was once on SSRI it is not sure if it helped her much.   She notes mild cognitive dysfunction.   She reports reduced short term memory and verbal fluency.    Ritalin did not help cognition much .     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 She had optic neuritis in 1999. In 2002, she had right-sided numbness. MRIs of the brain show just a couple of nonspecific foci. A cervical spine MRI showed a focus consistent with demyelination at C5. Additionally, there are 2 smaller foci at T2 and T3-T4.  CSF was also abnormal c/w MS.     She was initially placed on Betaseron and then on Copaxone. She was unable to tolerate either of these and has not been on any DMT medication since 2003.   She retried Betaseron but stopped after diagnosis of avascular necrosis requiring hip replacement.    Of note, she had had only one course of IV steroids prior to that and has had some afterwards.   She had not wanted to go on Tysabri or Gilenya and opted to go on no disease modifying therapy since 2012. At her last visit on 01/17/2014, she decided to go on Aubagio but never started.     Most recent MRIs are from earlier this year and showed no changes.   REVIEW OF SYSTEMS: Constitutional: No fevers, chills, sweats, or change in appetite.  Has fatigue Eyes: No visual changes, double vision, eye pain Ear, nose and throat: No hearing loss, ear pain, nasal congestion, sore throat Cardiovascular: No chest pain, palpitations Respiratory: No shortness of breath at rest or with exertion.   No wheezes GastrointestinaI: No nausea, vomiting, diarrhea, abdominal pain, fecal incontinence Genitourinary:  No dysuria, urinary retention or frequency.  No nocturia. Musculoskeletal: She reports back pain and pain and some of her joints, especially her knees and hips. Integumentary: No rash, pruritus, skin lesions Neurological: as above.  She reports restless leg, insomnia and excessive daytime sleepiness. Psychiatric: No depression at this time.  No anxiety Endocrine: No palpitations, diaphoresis, change in appetite, change in weigh.  He  notes heat intolerance and excessive thirst at times. Hematologic/Lymphatic: No anemia, purpura, petechiae. Allergic/Immunologic: No itchy/runny eyes, nasal congestion, recent allergic reactions, rashes  ALLERGIES: Allergies  Allergen Reactions  . Lamictal [Lamotrigine] Shortness Of Breath, Itching and Swelling    Tongue swells  . Mobic [Meloxicam] Anaphylaxis  . Ultram [Tramadol Hcl] Anaphylaxis  . Codeine Itching  . Diclofenac Itching  . Latex     Latex band-aids cause redness and tears your skin  . Nitrofurantoin     Causing itching all over, tongue swelling, chest heaviness, swollen lips, cough, rapid HR, swollen eyelids, red eyes/gums, lips red/white per pt.  . Teriflunomide Diarrhea    Hair loss, joint pains, elevated liver enzymes abugio (joint pains and liver issues)  . Trazodone And Nefazodone     Mouth sores, tongue swelling  . Tape Rash    HOME MEDICATIONS:  Current Outpatient Medications:  .  Ascorbic Acid (VITAMIN C) 1000 MG tablet, Take 1,000 mg by mouth daily., Disp: , Rfl:  .  aspirin EC 81 MG tablet, Take 81 mg by mouth daily., Disp: , Rfl:  .  baclofen (LIORESAL) 10 MG tablet, TAKE 2 TABLETS (20 MG TOTAL) BY MOUTH 3 (THREE) TIMES DAILY., Disp: 180 tablet, Rfl: 11 .  calcium-vitamin D (OSCAL WITH D) 500-200 MG-UNIT tablet, Take 1 tablet by mouth., Disp: , Rfl:  .  diphenhydrAMINE (BENADRYL) 25 MG tablet, Take 25 mg by mouth 3 (three) times daily., Disp: , Rfl:  .  etodolac (LODINE) 500 MG tablet, Take 500 mg by mouth. 1-2 times a day, Disp: , Rfl:  .  famotidine (PEPCID) 20 MG tablet, Take 1 tablet (20 mg total) by mouth 2 (two) times daily., Disp: 60 tablet, Rfl: 1 .  fluconazole (DIFLUCAN) 150 MG tablet, Take 1 tablet (150 mg total) by mouth daily., Disp: 1 tablet, Rfl: 0 .  gabapentin (NEURONTIN) 300 MG capsule, Take 1 capsule (300 mg total) by mouth 3 (three) times daily., Disp: 90 capsule, Rfl: 11 .  Grape Seed 100 MG CAPS, Take by mouth., Disp: , Rfl:  .   HYDROcodone-acetaminophen (NORCO/VICODIN) 5-325 MG tablet, Take 1 tablet by mouth 3 (three) times daily as needed for moderate pain., Disp: 90 tablet, Rfl: 0 .  Lactobacillus (PROBIOTIC ACIDOPHILUS PO), Take by mouth., Disp: , Rfl:  .  levocetirizine (XYZAL) 5 MG tablet, TAKE 1 TABLET EVERY EVENING, Disp: 30 tablet, Rfl: 0 .  Melatonin 5 MG TABS, Take 2 tablets by mouth at bedtime., Disp: , Rfl:  .  mometasone (NASONEX) 50 MCG/ACT nasal spray, Place 2 sprays into the nose daily as needed (allergies)., Disp: , Rfl:  .  QC LORATADINE-D 10-240 MG 24 hr tablet, TAKE 1 TABLET BY MOUTH DAILY., Disp: 30 tablet, Rfl: 6 .  rizatriptan (MAXALT-MLT) 10 MG disintegrating tablet, Take 1 tablet (10 mg total) by mouth as needed for migraine. May repeat in 2 hours if needed.  Max of 2 tablets in 24 hours, and 10 tablets per month, Disp: 10 tablet, Rfl: 11 .  tiZANidine (ZANAFLEX) 2 MG tablet, TAKE 1 TABLET (2 MG TOTAL) BY MOUTH EVERY 6 (  SIX) HOURS AS NEEDED FOR MUSCLE SPASMS., Disp: 120 tablet, Rfl: 11 .  Turmeric 1053 MG TABS, Take 1,053 mg by mouth daily., Disp: , Rfl:  .  VITAMIN D PO, Take by mouth daily., Disp: , Rfl:  .  Vitamin D, Ergocalciferol, (DRISDOL) 1.25 MG (50000 UT) CAPS capsule, ONE CAPSULE TWICE WEEKLY ON TUESDAYS/FRIDAYS X 2 MONTHS, THEN ONCE WEEKLY, Disp: 8 capsule, Rfl: 2 .  vitamin E 400 UNIT capsule, Take 400 Units by mouth daily., Disp: , Rfl:  .  zolpidem (AMBIEN) 10 MG tablet, Take 10 mg by mouth at bedtime as needed. For sleep, Disp: , Rfl:  .  LORazepam (ATIVAN) 1 MG tablet, Take 1 tablet (1 mg total) by mouth at bedtime., Disp: 30 tablet, Rfl: 5 .  sertraline (ZOLOFT) 50 MG tablet, Take 1 tablet (50 mg total) by mouth daily., Disp: 30 tablet, Rfl: 5  PAST MEDICAL HISTORY: Past Medical History:  Diagnosis Date  . Allergy   . Anxiety   . Avascular necrosis ([place])   . Colon polyp    pre-cancerous  . Complication of anesthesia    patient states she requires more anesthesia  .  Disorder of soft tissue   . Diverticulosis   . GERD (gastroesophageal reflux disease)   . H/O breast biopsy    pre- cancerous cells  . Hand fracture, left    9/87  . Heart palpitations   . History of Holter monitoring   . Hyperlipidemia   . Lhermitte's syndrome   . Migraines   . Multiple sclerosis ([place])    dx in 11/1996  . Multiple sclerosis ([place])   . Neuromuscular disorder ([place])   . Neuropathy   . Numbness and tingling in hands   . Obesity   . OSA (obstructive sleep apnea)    no longer have OSA since stopped taking a MS medication  . Otitis media   . Raynaud's phenomenon   . Sleep apnea    Due to medication that she no longer takes.  . Vaginitis and vulvovaginitis   . Vitamin D deficiency     PAST SURGICAL HISTORY: Past Surgical History:  Procedure Laterality Date  . ABDOMINAL HYSTERECTOMY Bilateral 02/12/2016   Procedure: HYSTERECTOMY ABDOMINAL WITH BILATERAL SALPINGO OOPHERECTOMY;  Surgeon: Dian Queen, MD;  Location: [facility];  Service: Gynecology;  Laterality: Bilateral;  . BREAST BIOPSY     core/left breast  . CHOLECYSTECTOMY    . COLONOSCOPY     1997/2001  . DILATION AND CURETTAGE OF UTERUS    . ENDOMETRIAL ABLATION    . LAPAROSCOPIC ABDOMINAL EXPLORATION     ovaries and intestines bond together  . LEFT HEART CATHETERIZATION WITH CORONARY ANGIOGRAM N/A 06/21/2011   Procedure: LEFT HEART CATHETERIZATION WITH CORONARY ANGIOGRAM;  Surgeon: Leonie Man, MD;  Location: [facility] CATH LAB;  Service: Cardiovascular;  Laterality: N/A;  . LUMBAR PUNCTURE     11/01/1996  . SALPINGOOPHORECTOMY Bilateral 02/12/2016   Procedure: BILATERAL SALPINGO OOPHORECTOMY;  Surgeon: Dian Queen, MD;  Location: [facility];  Service: Gynecology;  Laterality: Bilateral;  . TOTAL HIP ARTHROPLASTY     left hip  6/02  /right hip12/04    FAMILY HISTORY: Family History  Problem Relation Age of Onset  . Pancreatic cancer Mother   . Stroke Mother   . Testicular cancer Brother   . Colon  cancer Maternal Uncle   . Pancreatic cancer Paternal Uncle   . Prostate cancer Maternal Grandfather   . Colon polyps Father  SOCIAL HISTORY:  Social History   Socioeconomic History  . Marital status: Married    Spouse name: Not on file  . Number of children: Not on file  . Years of education: Not on file  . Highest education level: Not on file  Occupational History  . Occupation: disabled  Social Needs  . Financial resource strain: Not hard at all  . Food insecurity    Worry: Never true    Inability: Never true  . Transportation needs    Medical: No    Non-medical: No  Tobacco Use  . Smoking status: Former Smoker    Packs/day: 0.25    Years: 8.00    Pack years: 2.00    Types: Cigarettes    Quit date: 07/01/1995    Years since quitting: 23.6  . Smokeless tobacco: Never Used  Substance and Sexual Activity  . Alcohol use: No    Alcohol/week: 0.0 standard drinks  . Drug use: No  . Sexual activity: Not Currently  Lifestyle  . Physical activity    Days per week: 0 days    Minutes per session: 0 min  . Stress: Not at all  Relationships  . Social Herbalist on phone: Not on file    Gets together: Not on file    Attends religious service: Not on file    Active member of club or organization: Not on file    Attends meetings of clubs or organizations: Not on file    Relationship status: Not on file  . Intimate partner violence    Fear of current or ex partner: No    Emotionally abused: No    Physically abused: No    Forced sexual activity: No  Other Topics Concern  . Not on file  Social History Narrative  . Not on file     PHYSICAL EXAM  Vitals:   03/06/19 1306  BP: 110/70  Pulse: 98  Temp: (!) 97.5 F (36.4 C)  SpO2: 96%  Weight: 222 lb 8 oz (100.9 kg)  Height: 5' 5.6" (1.666 m)    Body mass index is 36.35 kg/m.   General: The patient is well-developed and well-nourished and in no acute distress  Neurologic Exam  Mental status:  The patient is alert and oriented x 3 at the time of the examination. The patient has apparent normal recent and remote memory, with an apparently normal attention span and concentration ability.   Speech is normal.  Cranial nerves: Extraocular movements are full.  Color vision is reduced on the right..  Facial strength and sensation was normal.. No obvious hearing deficits are noted.  Motor:  Muscle bulk is normal.   She has increased muscle tone in the legs, left greater than right.  Strength is 5/5 except for the left 4+/5 left EHL  Sensory: Sensory testing shows reduced touch and vibration sensation on the right side.. Sensation was normal on the left.  Coordination: Cerebellar testing reveals good finger-nose-finger and mildly reduced bilateral heel-to-shin  Gait and station: Station is normal.   Her gait is mildly wide but the tandem gait is poor.  The Romberg is negative.  Reflexes: Deep tendon reflexes are symmetric and normal bilaterally.         DIAGNOSTIC DATA (LABS, IMAGING, TESTING) - I reviewed patient records, labs, notes, testing and imaging myself where available.    ASSESSMENT AND PLAN  Multiple sclerosis ([place]) - Plan: Ambulatory referral to Behavioral Health  Depression, unspecified depression type -  Plan: Ambulatory referral to Behavioral Health  Insomnia, unspecified type  Ataxic gait  Other fatigue  Dysesthesia  Depression with anxiety    1.   We have discussed going back on a DMT but she would prefer to remain off of 1 and feels her MS is stable.. 2    sertraline for depression and anxiety.  We discussed referral to behavioral health for these issues and to help with stress management skills 3.   Try to be more active and exercises.  Try to lose weight 4.    Changes Ambien to lorazepam for insomnia.   5.  She will return to see me and 5 months or sooner if she has new or worsening neurologic symptoms.    Richard A. Felecia Shelling, MD, PhD [id number], [phone]  PM Certified in Neurology, Clinical Neurophysiology, Sleep Medicine, Pain Medicine and Neuroimaging  [facility] [address], [place], [place] [address] [phone]

## 2019-03-08 ENCOUNTER — Ambulatory Visit (INDEPENDENT_AMBULATORY_CARE_PROVIDER_SITE_OTHER): Payer: Medicare Other | Admitting: Psychology

## 2019-03-08 DIAGNOSIS — F064 Anxiety disorder due to known physiological condition: Secondary | ICD-10-CM | POA: Diagnosis not present

## 2019-03-13 ENCOUNTER — Ambulatory Visit (INDEPENDENT_AMBULATORY_CARE_PROVIDER_SITE_OTHER): Payer: Medicare Other

## 2019-03-13 ENCOUNTER — Other Ambulatory Visit: Payer: Self-pay

## 2019-03-13 VITALS — BP 132/82 | HR 90 | Temp 98.1°F | Ht 66.8 in | Wt 220.2 lb

## 2019-03-13 DIAGNOSIS — Z0000 Encounter for general adult medical examination without abnormal findings: Secondary | ICD-10-CM | POA: Diagnosis not present

## 2019-03-13 NOTE — Progress Notes (Signed)
Subjective:   Grace [name] is a 58 y.o. female who presents for Medicare Annual (Subsequent) preventive examination.  Review of Systems:  n/a Cardiac Risk Factors include: dyslipidemia;sedentary lifestyle;obesity (BMI >30kg/m2)     Objective:     Vitals: BP 132/82 (BP Location: Right Arm, Patient Position: Sitting, Cuff Size: Normal)   Pulse 90   Temp 98.1 F (36.7 C) (Oral)   Ht 5' 6.8" (1.697 m)   Wt 220 lb 3.2 oz (99.9 kg)   LMP 04/09/2013   SpO2 97%   BMI 34.70 kg/m   Body mass index is 34.7 kg/m.  Advanced Directives 03/13/2019 04/27/2018 06/30/2016 02/12/2016 02/04/2016 06/21/2011  Does Patient Have a Medical Advance Directive? No No No No No Patient does not have advance directive  Would patient like information on creating a medical advance directive? - Yes (MAU/Ambulatory/Procedural Areas - Information given) Yes (MAU/Ambulatory/Procedural Areas - Information given) No - patient declined information Yes - Educational materials given -    Tobacco Social History   Tobacco Use  Smoking Status Former Smoker  . Packs/day: 0.25  . Years: 8.00  . Pack years: 2.00  . Types: Cigarettes  . Quit date: 07/01/1995  . Years since quitting: 23.7  Smokeless Tobacco Never Used     Counseling given: Not Answered   Clinical Intake:  Pre-visit preparation completed: Yes  Pain : 0-10 Pain Score: 3  Pain Type: Chronic pain Pain Location: Back Pain Orientation: Lower Pain Descriptors / Indicators: Aching Pain Onset: More than a month ago Pain Frequency: Constant Pain Relieving Factors: rest  Pain Relieving Factors: rest  Nutritional Status: BMI > 30  Obese Nutritional Risks: None Diabetes: No  How often do you need to have someone help you when you read instructions, pamphlets, or other written materials from your doctor or pharmacy?: 1 - Never What is the last grade level you completed in school?: GED  Interpreter Needed?: No  Information entered by :: NAllen  LPN  Past Medical History:  Diagnosis Date  . Allergy   . Anxiety   . Avascular necrosis ([place])   . Colon polyp    pre-cancerous  . Complication of anesthesia    patient states she requires more anesthesia  . Disorder of soft tissue   . Diverticulosis   . GERD (gastroesophageal reflux disease)   . H/O breast biopsy    pre- cancerous cells  . Hand fracture, left    9/87  . Heart palpitations   . History of Holter monitoring   . Hyperlipidemia   . Lhermitte's syndrome   . Migraines   . Multiple sclerosis ([place])    dx in 11/1996  . Multiple sclerosis ([place])   . Neuromuscular disorder ([place])   . Neuropathy   . Numbness and tingling in hands   . Obesity   . OSA (obstructive sleep apnea)    no longer have OSA since stopped taking a MS medication  . Otitis media   . Raynaud's phenomenon   . Sleep apnea    Due to medication that she no longer takes.  . Vaginitis and vulvovaginitis   . Vitamin D deficiency    Past Surgical History:  Procedure Laterality Date  . ABDOMINAL HYSTERECTOMY Bilateral 02/12/2016   Procedure: HYSTERECTOMY ABDOMINAL WITH BILATERAL SALPINGO OOPHERECTOMY;  Surgeon: Dian Queen, MD;  Location: [facility];  Service: Gynecology;  Laterality: Bilateral;  . BREAST BIOPSY     core/left breast  . CHOLECYSTECTOMY    . COLONOSCOPY  1997/2001  . DILATION AND CURETTAGE OF UTERUS    . ENDOMETRIAL ABLATION    . LAPAROSCOPIC ABDOMINAL EXPLORATION     ovaries and intestines bond together  . LEFT HEART CATHETERIZATION WITH CORONARY ANGIOGRAM N/A 06/21/2011   Procedure: LEFT HEART CATHETERIZATION WITH CORONARY ANGIOGRAM;  Surgeon: Leonie Man, MD;  Location: [facility] CATH LAB;  Service: Cardiovascular;  Laterality: N/A;  . LUMBAR PUNCTURE     11/01/1996  . SALPINGOOPHORECTOMY Bilateral 02/12/2016   Procedure: BILATERAL SALPINGO OOPHORECTOMY;  Surgeon: Dian Queen, MD;  Location: [facility];  Service: Gynecology;  Laterality: Bilateral;  . TOTAL HIP ARTHROPLASTY      left hip  6/02  /right hip12/04   Family History  Problem Relation Age of Onset  . Pancreatic cancer Mother   . Stroke Mother   . Testicular cancer Brother   . Colon cancer Maternal Uncle   . Pancreatic cancer Paternal Uncle   . Prostate cancer Maternal Grandfather   . Colon polyps Father    Social History   Socioeconomic History  . Marital status: Married    Spouse name: Not on file  . Number of children: Not on file  . Years of education: Not on file  . Highest education level: Not on file  Occupational History  . Occupation: disabled  Social Needs  . Financial resource strain: Not hard at all  . Food insecurity    Worry: Sometimes true    Inability: Sometimes true  . Transportation needs    Medical: No    Non-medical: No  Tobacco Use  . Smoking status: Former Smoker    Packs/day: 0.25    Years: 8.00    Pack years: 2.00    Types: Cigarettes    Quit date: 07/01/1995    Years since quitting: 23.7  . Smokeless tobacco: Never Used  Substance and Sexual Activity  . Alcohol use: No    Alcohol/week: 0.0 standard drinks  . Drug use: Yes    Types: Hydrocodone  . Sexual activity: Not Currently  Lifestyle  . Physical activity    Days per week: 0 days    Minutes per session: 0 min  . Stress: Very much  Relationships  . Social Herbalist on phone: Not on file    Gets together: Not on file    Attends religious service: Not on file    Active member of club or organization: Not on file    Attends meetings of clubs or organizations: Not on file    Relationship status: Not on file  Other Topics Concern  . Not on file  Social History Narrative  . Not on file    Outpatient Encounter Medications as of 03/13/2019  Medication Sig  . Ascorbic Acid (VITAMIN C) 1000 MG tablet Take 1,000 mg by mouth daily.  [name] Kitchen aspirin EC 81 MG tablet Take 81 mg by mouth daily.  . baclofen (LIORESAL) 10 MG tablet TAKE 2 TABLETS (20 MG TOTAL) BY MOUTH 3 (THREE) TIMES DAILY.  .  diphenhydrAMINE (BENADRYL) 25 MG tablet Take 25 mg by mouth 3 (three) times daily.  [name] Kitchen etodolac (LODINE) 500 MG tablet Take 500 mg by mouth. 1-2 times a day  . famotidine (PEPCID) 20 MG tablet Take 1 tablet (20 mg total) by mouth 2 (two) times daily.  [name] Kitchen gabapentin (NEURONTIN) 300 MG capsule Take 1 capsule (300 mg total) by mouth 3 (three) times daily.  . Grape Seed 100 MG CAPS Take by mouth.  [name] Kitchen HYDROcodone-acetaminophen (  NORCO/VICODIN) 5-325 MG tablet Take 1 tablet by mouth 3 (three) times daily as needed for moderate pain.  . Lactobacillus (PROBIOTIC ACIDOPHILUS PO) Take by mouth.  . levocetirizine (XYZAL) 5 MG tablet TAKE 1 TABLET EVERY EVENING  . LORazepam (ATIVAN) 1 MG tablet Take 1 tablet (1 mg total) by mouth at bedtime.  . Melatonin 10 MG TABS Take 1 tablet by mouth at bedtime.   . QC LORATADINE-D 10-240 MG 24 hr tablet TAKE 1 TABLET BY MOUTH DAILY.  . rizatriptan (MAXALT-MLT) 10 MG disintegrating tablet Take 1 tablet (10 mg total) by mouth as needed for migraine. May repeat in 2 hours if needed.  Max of 2 tablets in 24 hours, and 10 tablets per month  . sertraline (ZOLOFT) 50 MG tablet Take 1 tablet (50 mg total) by mouth daily.  [name] Kitchen tiZANidine (ZANAFLEX) 2 MG tablet TAKE 1 TABLET (2 MG TOTAL) BY MOUTH EVERY 6 (SIX) HOURS AS NEEDED FOR MUSCLE SPASMS.  . Turmeric 1053 MG TABS Take 1,053 mg by mouth daily.  [name] Kitchen VITAMIN D PO Take by mouth daily.  . Vitamin D, Ergocalciferol, (DRISDOL) 1.25 MG (50000 UT) CAPS capsule ONE CAPSULE TWICE WEEKLY ON TUESDAYS/FRIDAYS X 2 MONTHS, THEN ONCE WEEKLY  . vitamin E 400 UNIT capsule Take 400 Units by mouth daily.  [name] Kitchen zolpidem (AMBIEN) 10 MG tablet Take 10 mg by mouth at bedtime as needed. For sleep  . calcium-vitamin D (OSCAL WITH D) 500-200 MG-UNIT tablet Take 1 tablet by mouth.  . fluconazole (DIFLUCAN) 150 MG tablet Take 1 tablet (150 mg total) by mouth daily.  . mometasone (NASONEX) 50 MCG/ACT nasal spray Place 2 sprays into the nose daily as needed  (allergies).   No facility-administered encounter medications on file as of 03/13/2019.     Activities of Daily Living In your present state of health, do you have any difficulty performing the following activities: 03/13/2019 04/27/2018  Hearing? N N  Vision? Y Y  Comment due to cataracts blurred vision  Difficulty concentrating or making decisions? Y Y  Comment sometimes forgetfulness -  Walking or climbing stairs? Y Y  Comment a little sometimes  Dressing or bathing? N N  Doing errands, shopping? N N  Preparing Food and eating ? N N  Using the Toilet? N N  In the past six months, have you accidently leaked urine? Y Y  Comment - has MS  Do you have problems with loss of bowel control? N Y  Comment - has diarrhea  Managing your Medications? N N  Managing your Finances? N N  Housekeeping or managing your Housekeeping? N Y  Comment - gets assistance  Some recent data might be hidden    Patient Care Team: [name], MD as PCP - General (Internal Medicine) Felecia Shelling, Nanine Means, MD (Neurology)    Assessment:   This is a routine wellness examination for Thiara.  Exercise Activities and Dietary recommendations Current Exercise Habits: The patient does not participate in regular exercise at present  Goals    . Exercise 150 min/wk Moderate Activity (pt-stated)       Fall Risk Fall Risk  03/13/2019 02/01/2019 01/03/2019 04/27/2018  Falls in the past year? 0 0 0 1  Number falls in past yr: 0 - - 1  Comment - - - loss of balance  Injury with Fall? - - - 1  Comment - - - popped neck (bulging disc)  Risk for fall due to : Medication side effect - - History of fall(s);Other (  Comment)  Risk for fall due to: Comment - - - has MS, Worked with therapy in a past  Follow up Falls evaluation completed;Education provided;Falls prevention discussed - - -   Is the patient's home free of loose throw rugs in walkways, pet beds, electrical cords, etc?   yes      Grab bars in the bathroom? yes       Handrails on the stairs?   n/a      Adequate lighting?   yes  Timed Get Up and Go performed: n/a  Depression Screen PHQ 2/9 Scores 03/13/2019 02/01/2019 01/03/2019 04/27/2018  PHQ - 2 Score 2 0 0 6  PHQ- 9 Score 14 - - 21     Cognitive Function     6CIT Screen 03/13/2019 04/27/2018  What Year? 0 points 0 points  What month? 0 points 0 points  What time? 0 points 0 points  Count back from 20 0 points 0 points  Months in reverse 2 points 2 points  Repeat phrase 6 points 2 points  Total Score 8 4     There is no immunization history on file for this patient.  Qualifies for Shingles Vaccine? no  Screening Tests Health Maintenance  Topic Date Due  . HIV Screening  04/24/1978  . PAP SMEAR-Modifier  04/24/1984  . TETANUS/TDAP  01/11/2019  . INFLUENZA VACCINE  08/08/2019 (Originally 12/09/2018)  . MAMMOGRAM  05/09/2020  . COLONOSCOPY  05/12/2023  . Hepatitis C Screening  Completed    Cancer Screenings: Lung: Low Dose CT Chest recommended if Age 40-80 years, 30 pack-year currently smoking OR have quit w/in 15years. Patient does not qualify. Breast:  Up to date on Mammogram? Yes   Up to date of Bone Density/Dexa? Yes Colorectal: up to date  Additional Screenings: : Hepatitis C Screening: due     Plan:    Patient wants to continue goal of last year, exercising regularly.   I have personally reviewed and noted the following in the patient's chart:   . Medical and social history . Use of alcohol, tobacco or illicit drugs  . Current medications and supplements . Functional ability and status . Nutritional status . Physical activity . Advanced directives . List of other physicians . Hospitalizations, surgeries, and ER visits in previous 12 months . Vitals . Screenings to include cognitive, depression, and falls . Referrals and appointments  In addition, I have reviewed and discussed with patient certain preventive protocols, quality metrics, and best practice  recommendations. A written personalized care plan for preventive services as well as general preventive health recommendations were provided to patient.     Kellie Simmering, LPN  [id number]

## 2019-03-13 NOTE — Patient Instructions (Signed)
Grace [name] , Thank you for taking time to come for your Medicare Wellness Visit. I appreciate your ongoing commitment to your health goals. Please review the following plan we discussed and let me know if I can assist you in the future.   Screening recommendations/referrals: Colonoscopy: 05/2018 Mammogram: 04/2018 Bone Density: 01/2011 Recommended yearly ophthalmology/optometry visit for glaucoma screening and checkup Recommended yearly dental visit for hygiene and checkup  Vaccinations: Influenza vaccine: decline Pneumococcal vaccine: n/a Tdap vaccine: declines at this time Shingles vaccine: not eligible    Advanced directives: Advance directive discussed with you today. Even though you declined this today please call our office should you change your mind and we can give you the proper paperwork for you to fill out.   Conditions/risks identified: obesity  Next appointment: 04/11/2019 at 2:45  Preventive Care 40-64 Years, Female Preventive care refers to lifestyle choices and visits with your health care provider that can promote health and wellness. What does preventive care include?  A yearly physical exam. This is also called an annual well check.  Dental exams once or twice a year.  Routine eye exams. Ask your health care provider how often you should have your eyes checked.  Personal lifestyle choices, including:  Daily care of your teeth and gums.  Regular physical activity.  Eating a healthy diet.  Avoiding tobacco and drug use.  Limiting alcohol use.  Practicing safe sex.  Taking low-dose aspirin daily starting at age 22.  Taking vitamin and mineral supplements as recommended by your health care provider. What happens during an annual well check? The services and screenings done by your health care provider during your annual well check will depend on your age, overall health, lifestyle risk factors, and family history of disease. Counseling  Your health  care provider may ask you questions about your:  Alcohol use.  Tobacco use.  Drug use.  Emotional well-being.  Home and relationship well-being.  Sexual activity.  Eating habits.  Work and work Statistician.  Method of birth control.  Menstrual cycle.  Pregnancy history. Screening  You may have the following tests or measurements:  Height, weight, and BMI.  Blood pressure.  Lipid and cholesterol levels. These may be checked every 5 years, or more frequently if you are over 67 years old.  Skin check.  Lung cancer screening. You may have this screening every year starting at age 5 if you have a 30-pack-year history of smoking and currently smoke or have quit within the past 15 years.  Fecal occult blood test (FOBT) of the stool. You may have this test every year starting at age 18.  Flexible sigmoidoscopy or colonoscopy. You may have a sigmoidoscopy every 5 years or a colonoscopy every 10 years starting at age 84.  Hepatitis C blood test.  Hepatitis B blood test.  Sexually transmitted disease (STD) testing.  Diabetes screening. This is done by checking your blood sugar (glucose) after you have not eaten for a while (fasting). You may have this done every 1-3 years.  Mammogram. This may be done every 1-2 years. Talk to your health care provider about when you should start having regular mammograms. This may depend on whether you have a family history of breast cancer.  BRCA-related cancer screening. This may be done if you have a family history of breast, ovarian, tubal, or peritoneal cancers.  Pelvic exam and Pap test. This may be done every 3 years starting at age 55. Starting at age 39, this may be  done every 5 years if you have a Pap test in combination with an HPV test.  Bone density scan. This is done to screen for osteoporosis. You may have this scan if you are at high risk for osteoporosis. Discuss your test results, treatment options, and if necessary, the  need for more tests with your health care provider. Vaccines  Your health care provider may recommend certain vaccines, such as:  Influenza vaccine. This is recommended every year.  Tetanus, diphtheria, and acellular pertussis (Tdap, Td) vaccine. You may need a Td booster every 10 years.  Zoster vaccine. You may need this after age 43.  Pneumococcal 13-valent conjugate (PCV13) vaccine. You may need this if you have certain conditions and were not previously vaccinated.  Pneumococcal polysaccharide (PPSV23) vaccine. You may need one or two doses if you smoke cigarettes or if you have certain conditions. Talk to your health care provider about which screenings and vaccines you need and how often you need them. This information is not intended to replace advice given to you by your health care provider. Make sure you discuss any questions you have with your health care provider. Document Released: 05/23/2015 Document Revised: 01/14/2016 Document Reviewed: 02/25/2015 Elsevier Interactive Patient Education  2017 [place] Prevention in the Home Falls can cause injuries. They can happen to people of all ages. There are many things you can do to make your home safe and to help prevent falls. What can I do on the outside of my home?  Regularly fix the edges of walkways and driveways and fix any cracks.  Remove anything that might make you trip as you walk through a door, such as a raised step or threshold.  Trim any bushes or trees on the path to your home.  Use bright outdoor lighting.  Clear any walking paths of anything that might make someone trip, such as rocks or tools.  Regularly check to see if handrails are loose or broken. Make sure that both sides of any steps have handrails.  Any raised decks and porches should have guardrails on the edges.  Have any leaves, snow, or ice cleared regularly.  Use sand or salt on walking paths during winter.  Clean up any  spills in your garage right away. This includes oil or grease spills. What can I do in the bathroom?  Use night lights.  Install grab bars by the toilet and in the tub and shower. Do not use towel bars as grab bars.  Use non-skid mats or decals in the tub or shower.  If you need to sit down in the shower, use a plastic, non-slip stool.  Keep the floor dry. Clean up any water that spills on the floor as soon as it happens.  Remove soap buildup in the tub or shower regularly.  Attach bath mats securely with double-sided non-slip rug tape.  Do not have throw rugs and other things on the floor that can make you trip. What can I do in the bedroom?  Use night lights.  Make sure that you have a light by your bed that is easy to reach.  Do not use any sheets or blankets that are too big for your bed. They should not hang down onto the floor.  Have a firm chair that has side arms. You can use this for support while you get dressed.  Do not have throw rugs and other things on the floor that can make you trip. What  can I do in the kitchen?  Clean up any spills right away.  Avoid walking on wet floors.  Keep items that you use a lot in easy-to-reach places.  If you need to reach something above you, use a strong step stool that has a grab bar.  Keep electrical cords out of the way.  Do not use floor polish or wax that makes floors slippery. If you must use wax, use non-skid floor wax.  Do not have throw rugs and other things on the floor that can make you trip. What can I do with my stairs?  Do not leave any items on the stairs.  Make sure that there are handrails on both sides of the stairs and use them. Fix handrails that are broken or loose. Make sure that handrails are as long as the stairways.  Check any carpeting to make sure that it is firmly attached to the stairs. Fix any carpet that is loose or worn.  Avoid having throw rugs at the top or bottom of the stairs. If you  do have throw rugs, attach them to the floor with carpet tape.  Make sure that you have a light switch at the top of the stairs and the bottom of the stairs. If you do not have them, ask someone to add them for you. What else can I do to help prevent falls?  Wear shoes that:  Do not have high heels.  Have rubber bottoms.  Are comfortable and fit you well.  Are closed at the toe. Do not wear sandals.  If you use a stepladder:  Make sure that it is fully opened. Do not climb a closed stepladder.  Make sure that both sides of the stepladder are locked into place.  Ask someone to hold it for you, if possible.  Clearly mark and make sure that you can see:  Any grab bars or handrails.  First and last steps.  Where the edge of each step is.  Use tools that help you move around (mobility aids) if they are needed. These include:  Canes.  Walkers.  Scooters.  Crutches.  Turn on the lights when you go into a dark area. Replace any light bulbs as soon as they burn out.  Set up your furniture so you have a clear path. Avoid moving your furniture around.  If any of your floors are uneven, fix them.  If there are any pets around you, be aware of where they are.  Review your medicines with your doctor. Some medicines can make you feel dizzy. This can increase your chance of falling. Ask your doctor what other things that you can do to help prevent falls. This information is not intended to replace advice given to you by your health care provider. Make sure you discuss any questions you have with your health care provider. Document Released: 02/20/2009 Document Revised: 10/02/2015 Document Reviewed: 05/31/2014 Elsevier Interactive Patient Education  2017 [employer].

## 2019-03-15 ENCOUNTER — Other Ambulatory Visit: Payer: Self-pay | Admitting: Internal Medicine

## 2019-03-16 MED ORDER — LEVOCETIRIZINE DIHYDROCHLORIDE 5 MG PO TABS: 5.0000 mg | ORAL_TABLET | Freq: Every evening | ORAL | 0 refills | Status: DC

## 2019-03-20 ENCOUNTER — Ambulatory Visit (INDEPENDENT_AMBULATORY_CARE_PROVIDER_SITE_OTHER): Payer: Medicare Other | Admitting: Psychology

## 2019-03-20 DIAGNOSIS — F064 Anxiety disorder due to known physiological condition: Secondary | ICD-10-CM

## 2019-03-25 ENCOUNTER — Other Ambulatory Visit: Payer: Self-pay | Admitting: Internal Medicine

## 2019-03-29 ENCOUNTER — Other Ambulatory Visit: Payer: Self-pay | Admitting: Neurology

## 2019-03-30 ENCOUNTER — Ambulatory Visit (INDEPENDENT_AMBULATORY_CARE_PROVIDER_SITE_OTHER): Payer: Medicare Other | Admitting: Psychology

## 2019-03-30 DIAGNOSIS — F064 Anxiety disorder due to known physiological condition: Secondary | ICD-10-CM

## 2019-04-03 ENCOUNTER — Other Ambulatory Visit: Payer: Self-pay | Admitting: Neurology

## 2019-04-10 ENCOUNTER — Other Ambulatory Visit: Payer: Self-pay | Admitting: Internal Medicine

## 2019-04-10 DIAGNOSIS — Z1231 Encounter for screening mammogram for malignant neoplasm of breast: Secondary | ICD-10-CM

## 2019-04-11 ENCOUNTER — Other Ambulatory Visit: Payer: Self-pay

## 2019-04-11 ENCOUNTER — Ambulatory Visit (INDEPENDENT_AMBULATORY_CARE_PROVIDER_SITE_OTHER): Payer: Medicare Other | Admitting: Internal Medicine

## 2019-04-11 ENCOUNTER — Encounter: Payer: Self-pay | Admitting: Internal Medicine

## 2019-04-11 VITALS — BP 110/76 | HR 89 | Temp 97.6°F | Ht 66.8 in | Wt 223.2 lb

## 2019-04-11 DIAGNOSIS — Z0000 Encounter for general adult medical examination without abnormal findings: Secondary | ICD-10-CM

## 2019-04-11 DIAGNOSIS — E559 Vitamin D deficiency, unspecified: Secondary | ICD-10-CM

## 2019-04-11 DIAGNOSIS — G35 Multiple sclerosis: Secondary | ICD-10-CM

## 2019-04-11 DIAGNOSIS — Z79899 Other long term (current) drug therapy: Secondary | ICD-10-CM

## 2019-04-11 DIAGNOSIS — N39498 Other specified urinary incontinence: Secondary | ICD-10-CM

## 2019-04-11 DIAGNOSIS — Z6835 Body mass index (BMI) 35.0-35.9, adult: Secondary | ICD-10-CM

## 2019-04-11 DIAGNOSIS — G373 Acute transverse myelitis in demyelinating disease of central nervous system: Secondary | ICD-10-CM

## 2019-04-11 DIAGNOSIS — E6609 Other obesity due to excess calories: Secondary | ICD-10-CM

## 2019-04-11 DIAGNOSIS — W1842XA Slipping, tripping and stumbling without falling due to stepping into hole or opening, initial encounter: Secondary | ICD-10-CM

## 2019-04-11 LAB — POCT URINALYSIS DIPSTICK
Blood, UA: NEGATIVE
Glucose, UA: NEGATIVE
Ketones, UA: NEGATIVE
Leukocytes, UA: NEGATIVE
Nitrite, UA: NEGATIVE
Protein, UA: NEGATIVE
Spec Grav, UA: 1.01 (ref 1.010–1.025)
Urobilinogen, UA: 0.2 E.U./dL
pH, UA: 5 (ref 5.0–8.0)

## 2019-04-11 MED ORDER — VITAMIN D (ERGOCALCIFEROL) 1.25 MG (50000 UNIT) PO CAPS: ORAL_CAPSULE | ORAL | 2 refills | Status: DC

## 2019-04-11 NOTE — Progress Notes (Addendum)
This visit occurred during the SARS-CoV-2 public health emergency.  Safety protocols were in place, including screening questions prior to the visit, additional usage of staff PPE, and extensive cleaning of exam room while observing appropriate contact time as indicated for disinfecting solutions.  Subjective:     Patient ID: Grace [name] , female    DOB: Sep 11, 1960 , 58 y.o.   MRN: [id number]   Chief Complaint  Patient presents with  . Annual Exam    HPI  She is here today for a full physical exam. She is followed by Dr. Hazle Coca for her GYN care. She is scheduled for her next visit in Feb 2021.  She is scheduled for mammogram in Jan 2021.     Past Medical History:  Diagnosis Date  . Allergy   . Anxiety   . Avascular necrosis ([place])   . Colon polyp    pre-cancerous  . Complication of anesthesia    patient states she requires more anesthesia  . Disorder of soft tissue   . Diverticulosis   . GERD (gastroesophageal reflux disease)   . H/O breast biopsy    pre- cancerous cells  . Hand fracture, left    9/87  . Heart palpitations   . History of Holter monitoring   . Hyperlipidemia   . Lhermitte's syndrome   . Migraines   . Multiple sclerosis ([place])    dx in 11/1996  . Multiple sclerosis ([place])   . Neuromuscular disorder ([place])   . Neuropathy   . Numbness and tingling in hands   . Obesity   . OSA (obstructive sleep apnea)    no longer have OSA since stopped taking a MS medication  . Otitis media   . Raynaud's phenomenon   . Sleep apnea    Due to medication that she no longer takes.  . Vaginitis and vulvovaginitis   . Vitamin D deficiency      Family History  Problem Relation Age of Onset  . Pancreatic cancer Mother   . Stroke Mother   . Testicular cancer Brother   . Colon cancer Maternal Uncle   . Pancreatic cancer Paternal Uncle   . Prostate cancer Maternal Grandfather   . Colon polyps Father      Current Outpatient Medications:  .  Ascorbic Acid (VITAMIN  C) 1000 MG tablet, Take 1,000 mg by mouth daily., Disp: , Rfl:  .  aspirin EC 81 MG tablet, Take 81 mg by mouth daily., Disp: , Rfl:  .  baclofen (LIORESAL) 10 MG tablet, TAKE 2 TABLETS (20 MG TOTAL) BY MOUTH 3 (THREE) TIMES DAILY., Disp: 180 tablet, Rfl: 11 .  diphenhydrAMINE (BENADRYL) 25 MG tablet, Take 25 mg by mouth 3 (three) times daily., Disp: , Rfl:  .  doxepin (SINEQUAN) 10 MG capsule, TAKE 1 TO 2 CAPSULES AT BEDTIME, Disp: , Rfl:  .  etodolac (LODINE) 500 MG tablet, Take 500 mg by mouth. 1-2 times a day, Disp: , Rfl:  .  Evening Primrose Oil 1000 MG CAPS, , Disp: , Rfl:  .  famotidine (PEPCID) 20 MG tablet, TAKE 1 TABLET BY MOUTH TWICE A DAY, Disp: 60 tablet, Rfl: 0 .  Flaxseed, Linseed, (FLAXSEED OIL) 1000 MG CAPS, , Disp: , Rfl:  .  gabapentin (NEURONTIN) 300 MG capsule, Take 1 capsule (300 mg total) by mouth 3 (three) times daily., Disp: 90 capsule, Rfl: 11 .  Grape Seed 100 MG CAPS, Take by mouth., Disp: , Rfl:  .  HYDROcodone-acetaminophen (NORCO/VICODIN) 5-325  MG tablet, Take 1 tablet by mouth 3 (three) times daily as needed for moderate pain., Disp: 90 tablet, Rfl: 0 .  Lactobacillus (PROBIOTIC ACIDOPHILUS PO), Take by mouth., Disp: , Rfl:  .  levocetirizine (XYZAL) 5 MG tablet, Take 1 tablet (5 mg total) by mouth every evening., Disp: 30 tablet, Rfl: 0 .  LORazepam (ATIVAN) 1 MG tablet, Take 1 tablet (1 mg total) by mouth at bedtime., Disp: 30 tablet, Rfl: 5 .  Melatonin 10 MG TABS, Take 1 tablet by mouth at bedtime. , Disp: , Rfl:  .  QC LORATADINE-D 10-240 MG 24 hr tablet, TAKE 1 TABLET BY MOUTH DAILY., Disp: 30 tablet, Rfl: 6 .  rizatriptan (MAXALT-MLT) 10 MG disintegrating tablet, Take 1 tablet (10 mg total) by mouth as needed for migraine. May repeat in 2 hours if needed.  Max of 2 tablets in 24 hours, and 10 tablets per month, Disp: 10 tablet, Rfl: 11 .  sertraline (ZOLOFT) 50 MG tablet, TAKE 1 TABLET BY MOUTH EVERY DAY, Disp: 90 tablet, Rfl: 2 .  tiZANidine (ZANAFLEX) 2 MG  tablet, TAKE 1 TABLET (2 MG TOTAL) BY MOUTH EVERY 6 (SIX) HOURS AS NEEDED FOR MUSCLE SPASMS., Disp: 120 tablet, Rfl: 11 .  Turmeric 1053 MG TABS, Take 1,053 mg by mouth daily., Disp: , Rfl:  .  VITAMIN D PO, Take by mouth daily., Disp: , Rfl:  .  Vitamin D, Ergocalciferol, (DRISDOL) 1.25 MG (50000 UT) CAPS capsule, ONE CAPSULE TWICE WEEKLY ON TUESDAYS/FRIDAYS X 2 MONTHS, THEN ONCE WEEKLY, Disp: 8 capsule, Rfl: 2 .  vitamin E 400 UNIT capsule, Take 400 Units by mouth daily., Disp: , Rfl:  .  zolpidem (AMBIEN) 10 MG tablet, Take 10 mg by mouth at bedtime as needed. For sleep, Disp: , Rfl:    Allergies  Allergen Reactions  . Lamictal [Lamotrigine] Shortness Of Breath, Itching and Swelling    Tongue swells  . Mobic [Meloxicam] Anaphylaxis  . Ultram [Tramadol Hcl] Anaphylaxis  . Codeine Itching  . Diclofenac Itching  . Latex     Latex band-aids cause redness and tears your skin  . Nitrofurantoin     Causing itching all over, tongue swelling, chest heaviness, swollen lips, cough, rapid HR, swollen eyelids, red eyes/gums, lips red/white per pt.  . Teriflunomide Diarrhea    Hair loss, joint pains, elevated liver enzymes abugio (joint pains and liver issues)  . Trazodone And Nefazodone     Mouth sores, tongue swelling  . Tape Rash     The patient states she uses post menopausal status for birth control. Last LMP was Patient's last menstrual period was 04/09/2013.. Negative for Dysmenorrhea Negative for: breast discharge, breast lump(s), breast pain and breast self exam. Associated symptoms include abnormal vaginal bleeding. Pertinent negatives include abnormal bleeding (hematology), anxiety, decreased libido, depression, difficulty falling sleep, dyspareunia, history of infertility, nocturia, sexual dysfunction, sleep disturbances, urinary incontinence, urinary urgency, vaginal discharge and vaginal itching. Diet regular.The patient states her exercise level is    . The patient's tobacco use is:   Social History   Tobacco Use  Smoking Status Former Smoker  . Packs/day: 0.25  . Years: 8.00  . Pack years: 2.00  . Types: Cigarettes  . Quit date: 07/01/1995  . Years since quitting: 23.7  Smokeless Tobacco Never Used  . She has been exposed to passive smoke. The patient's alcohol use is:  Social History   Substance and Sexual Activity  Alcohol Use No  . Alcohol/week: 0.0 standard drinks  Review of Systems  Constitutional: Negative.   HENT: Negative.   Eyes: Negative.   Respiratory: Negative.   Cardiovascular: Negative.   Endocrine: Negative.   Genitourinary: Negative.        She feels her incontinence has improved. She no longer feels the need to see specialist.   Skin: Negative.   Allergic/Immunologic: Negative.   Neurological: Negative.   Hematological: Negative.   Psychiatric/Behavioral: Negative.       Today's Vitals   04/11/19 1438  BP: 110/76  Pulse: 89  Temp: 97.6 F (36.4 C)  TempSrc: Oral  Weight: 223 lb 3.2 oz (101.2 kg)  Height: 5' 6.8" (1.697 m)  PainSc: 4   PainLoc: Back   Body mass index is 35.17 kg/m.   Objective:  Physical Exam Vitals signs and nursing note reviewed.  Constitutional:      Appearance: Normal appearance. She is obese.  HENT:     Head: Normocephalic and atraumatic.     Right Ear: Tympanic membrane, ear canal and external ear normal.     Left Ear: Tympanic membrane, ear canal and external ear normal.     Nose: Nose normal.     Mouth/Throat:     Mouth: Mucous membranes are moist.     Pharynx: Oropharynx is clear.  Eyes:     Extraocular Movements: Extraocular movements intact.     Conjunctiva/sclera: Conjunctivae normal.     Pupils: Pupils are equal, round, and reactive to light.  Neck:     Musculoskeletal: Normal range of motion and neck supple.  Cardiovascular:     Rate and Rhythm: Normal rate and regular rhythm.     Pulses: Normal pulses.     Heart sounds: Normal heart sounds.  Pulmonary:     Effort:  Pulmonary effort is normal.     Breath sounds: Normal breath sounds.  Abdominal:     General: Bowel sounds are normal.     Palpations: Abdomen is soft.     Comments: Obese. soft  Genitourinary:    Comments: deferred Musculoskeletal: Normal range of motion.  Skin:    General: Skin is warm and dry.     Findings: Bruising present.     Comments: bruising located on LLE  Neurological:     General: No focal deficit present.     Mental Status: She is alert and oriented to person, place, and time. Mental status is at baseline.     Cranial Nerves: Cranial nerve deficit present.     Sensory: No sensory deficit.     Motor: Weakness present.  Psychiatric:        Mood and Affect: Mood normal.        Behavior: Behavior normal.         Assessment And Plan:     1. Routine general medical examination at health care facility  A full exam was performed. Recent labwork reviewed in full detail. Importance of monthly self breast exams was discussed with the patient. PATIENT HAS BEEN ADVISED TO GET 30-45 MINUTES REGULAR EXERCISE NO LESS THAN FOUR TO FIVE DAYS PER WEEK - BOTH WEIGHTBEARING EXERCISES AND AEROBIC ARE RECOMMENDED.  SHE WAS ADVISED TO FOLLOW A HEALTHY DIET WITH AT LEAST SIX FRUITS/VEGGIES PER DAY, DECREASE INTAKE OF RED MEAT, AND TO INCREASE FISH INTAKE TO TWO DAYS PER WEEK.  MEATS/FISH SHOULD NOT BE FRIED, BAKED OR BROILED IS PREFERABLE.  I SUGGEST WEARING SPF 50 SUNSCREEN ON EXPOSED PARTS AND ESPECIALLY WHEN IN THE DIRECT SUNLIGHT FOR AN EXTENDED PERIOD OF  TIME.  PLEASE AVOID FAST FOOD RESTAURANTS AND INCREASE YOUR WATER INTAKE.   2. Multiple sclerosis ([facility])  Chronic. Also managed by Neurology.  3. Other urinary incontinence  I will check urinalysis today.   - POCT Urinalysis Dipstick (81002)  4. Slipping, tripping and stumbling without falling due to stepping into hole or opening, initial encounter  She slipped and fell into hole on Thanksgiving Day. She was in her yard, walking  her dog, when she slipped into the hole. She did not see the hole b/c it was covered by leaves.  She is not experiencing any leg pain at this time.   5. Vitamin D deficiency disease  Importance of compliance with vitamin D supplementation was discussed with the patient.   6. Class 2 obesity due to excess calories without serious comorbidity with body mass index (BMI) of 35.0 to 35.9 in adult  She is encouraged to strive for BMI less than 30 to decrease cardiac risk. Importance of achieving optimal weight to decrease risk of cardiovascular disease and cancers was discussed with the patient in full detail. She is encouraged to start slowly - start with 10 minutes twice daily at least three to four days per week and to gradually build to 30 minutes five days weekly. She was given tips to incorporate more activity into her daily routine - take stairs when possible, park farther away from grocery stores, etc.    7. Drug therapy  - Lipid panel - BMP8+EGFR   8. Transverse myelitis ([place])  She reports most of her sx have improved. Again, she is still followed by Neurology.  [name], MD    THE PATIENT IS ENCOURAGED TO PRACTICE SOCIAL DISTANCING DUE TO THE COVID-19 PANDEMIC.

## 2019-04-11 NOTE — Patient Instructions (Signed)
Health Maintenance, Female Adopting a healthy lifestyle and getting preventive care are important in promoting health and wellness. Ask your health care provider about:  The right schedule for you to have regular tests and exams.  Things you can do on your own to prevent diseases and keep yourself healthy. What should I know about diet, weight, and exercise? Eat a healthy diet   Eat a diet that includes plenty of vegetables, fruits, low-fat dairy products, and lean protein.  Do not eat a lot of foods that are high in solid fats, added sugars, or sodium. Maintain a healthy weight Body mass index (BMI) is used to identify weight problems. It estimates body fat based on height and weight. Your health care provider can help determine your BMI and help you achieve or maintain a healthy weight. Get regular exercise Get regular exercise. This is one of the most important things you can do for your health. Most adults should:  Exercise for at least 150 minutes each week. The exercise should increase your heart rate and make you sweat (moderate-intensity exercise).  Do strengthening exercises at least twice a week. This is in addition to the moderate-intensity exercise.  Spend less time sitting. Even light physical activity can be beneficial. Watch cholesterol and blood lipids Have your blood tested for lipids and cholesterol at 56 years of age, then have this test every 5 years. Have your cholesterol levels checked more often if:  Your lipid or cholesterol levels are high.  You are older than 56 years of age.  You are at high risk for heart disease. What should I know about cancer screening? Depending on your health history and family history, you may need to have cancer screening at various ages. This may include screening for:  Breast cancer.  Cervical cancer.  Colorectal cancer.  Skin cancer.  Lung cancer. What should I know about heart disease, diabetes, and high blood  pressure? Blood pressure and heart disease  High blood pressure causes heart disease and increases the risk of stroke. This is more likely to develop in people who have high blood pressure readings, are of African descent, or are overweight.  Have your blood pressure checked: ? Every 3-5 years if you are 18-39 years of age. ? Every year if you are 40 years old or older. Diabetes Have regular diabetes screenings. This checks your fasting blood sugar level. Have the screening done:  Once every three years after age 40 if you are at a normal weight and have a low risk for diabetes.  More often and at a younger age if you are overweight or have a high risk for diabetes. What should I know about preventing infection? Hepatitis B If you have a higher risk for hepatitis B, you should be screened for this virus. Talk with your health care provider to find out if you are at risk for hepatitis B infection. Hepatitis C Testing is recommended for:  Everyone born from 1945 through 1965.  Anyone with known risk factors for hepatitis C. Sexually transmitted infections (STIs)  Get screened for STIs, including gonorrhea and chlamydia, if: ? You are sexually active and are younger than 56 years of age. ? You are older than 56 years of age and your health care provider tells you that you are at risk for this type of infection. ? Your sexual activity has changed since you were last screened, and you are at increased risk for chlamydia or gonorrhea. Ask your health care provider if   you are at risk.  Ask your health care provider about whether you are at high risk for HIV. Your health care provider may recommend a prescription medicine to help prevent HIV infection. If you choose to take medicine to prevent HIV, you should first get tested for HIV. You should then be tested every 3 months for as long as you are taking the medicine. Pregnancy  If you are about to stop having your period (premenopausal) and  you may become pregnant, seek counseling before you get pregnant.  Take 400 to 800 micrograms (mcg) of folic acid every day if you become pregnant.  Ask for birth control (contraception) if you want to prevent pregnancy. Osteoporosis and menopause Osteoporosis is a disease in which the bones lose minerals and strength with aging. This can result in bone fractures. If you are 65 years old or older, or if you are at risk for osteoporosis and fractures, ask your health care provider if you should:  Be screened for bone loss.  Take a calcium or vitamin D supplement to lower your risk of fractures.  Be given hormone replacement therapy (HRT) to treat symptoms of menopause. Follow these instructions at home: Lifestyle  Do not use any products that contain nicotine or tobacco, such as cigarettes, e-cigarettes, and chewing tobacco. If you need help quitting, ask your health care provider.  Do not use street drugs.  Do not share needles.  Ask your health care provider for help if you need support or information about quitting drugs. Alcohol use  Do not drink alcohol if: ? Your health care provider tells you not to drink. ? You are pregnant, may be pregnant, or are planning to become pregnant.  If you drink alcohol: ? Limit how much you use to 0-1 drink a day. ? Limit intake if you are breastfeeding.  Be aware of how much alcohol is in your drink. In the [place] one drink equals one 12 oz bottle of beer (355 mL), one 5 oz glass of wine (148 mL), or one 1 oz glass of hard liquor (44 mL). General instructions  Schedule regular health, dental, and eye exams.  Stay current with your vaccines.  Tell your health care provider if: ? You often feel depressed. ? You have ever been abused or do not feel safe at home. Summary  Adopting a healthy lifestyle and getting preventive care are important in promoting health and wellness.  Follow your health care provider's instructions about healthy  diet, exercising, and getting tested or screened for diseases.  Follow your health care provider's instructions on monitoring your cholesterol and blood pressure. This information is not intended to replace advice given to you by your health care provider. Make sure you discuss any questions you have with your health care provider. Document Released: 11/09/2010 Document Revised: 04/19/2018 Document Reviewed: 04/19/2018 Elsevier Patient Education  2020 [employer]  

## 2019-04-12 ENCOUNTER — Other Ambulatory Visit: Payer: Self-pay

## 2019-04-12 LAB — LIPID PANEL
Chol/HDL Ratio: 3.3 ratio (ref 0.0–4.4)
Cholesterol, Total: 209 mg/dL — ABNORMAL HIGH (ref 100–199)
HDL: 63 mg/dL (ref >39)
LDL Chol Calc (NIH): 105 mg/dL — ABNORMAL HIGH (ref 0–99)
Triglycerides: 245 mg/dL — ABNORMAL HIGH (ref 0–149)
VLDL Cholesterol Cal: 41 mg/dL — ABNORMAL HIGH (ref 5–40)

## 2019-04-12 LAB — BMP8+EGFR
BUN/Creatinine Ratio: 21 (ref 9–23)
BUN: 16 mg/dL (ref 6–24)
CO2: 25 mmol/L (ref 20–29)
Calcium: 9.5 mg/dL (ref 8.7–10.2)
Chloride: 101 mmol/L (ref 96–106)
Creatinine, Ser: 0.77 mg/dL (ref 0.57–1.00)
GFR calc Af Amer: 101 mL/min/{1.73_m2} (ref >59)
GFR calc non Af Amer: 87 mL/min/{1.73_m2} (ref >59)
Glucose: 84 mg/dL (ref 65–99)
Potassium: 4.4 mmol/L (ref 3.5–5.2)
Sodium: 140 mmol/L (ref 134–144)

## 2019-04-16 MED ORDER — HYDROCODONE-ACETAMINOPHEN 5-325 MG PO TABS: 1.0000 | ORAL_TABLET | Freq: Three times a day (TID) | ORAL | 0 refills | Status: DC | PRN

## 2019-04-18 ENCOUNTER — Ambulatory Visit (INDEPENDENT_AMBULATORY_CARE_PROVIDER_SITE_OTHER): Payer: Medicare Other | Admitting: Psychology

## 2019-04-18 DIAGNOSIS — F064 Anxiety disorder due to known physiological condition: Secondary | ICD-10-CM | POA: Diagnosis not present

## 2019-04-19 ENCOUNTER — Other Ambulatory Visit: Payer: Self-pay | Admitting: Internal Medicine

## 2019-05-01 ENCOUNTER — Ambulatory Visit (INDEPENDENT_AMBULATORY_CARE_PROVIDER_SITE_OTHER): Payer: Medicare Other | Admitting: Psychology

## 2019-05-01 DIAGNOSIS — F064 Anxiety disorder due to known physiological condition: Secondary | ICD-10-CM | POA: Diagnosis not present

## 2019-05-09 ENCOUNTER — Encounter: Payer: Medicare Other | Admitting: Internal Medicine

## 2019-05-09 ENCOUNTER — Ambulatory Visit: Payer: Medicare Other

## 2019-05-15 ENCOUNTER — Ambulatory Visit (INDEPENDENT_AMBULATORY_CARE_PROVIDER_SITE_OTHER): Payer: Medicare Other | Admitting: Psychology

## 2019-05-15 DIAGNOSIS — F064 Anxiety disorder due to known physiological condition: Secondary | ICD-10-CM | POA: Diagnosis not present

## 2019-05-29 ENCOUNTER — Other Ambulatory Visit: Payer: Self-pay

## 2019-05-29 ENCOUNTER — Ambulatory Visit (INDEPENDENT_AMBULATORY_CARE_PROVIDER_SITE_OTHER): Payer: Medicare Other | Admitting: Psychology

## 2019-05-29 DIAGNOSIS — F064 Anxiety disorder due to known physiological condition: Secondary | ICD-10-CM

## 2019-05-29 MED ORDER — HYDROCODONE-ACETAMINOPHEN 5-325 MG PO TABS: 1.0000 | ORAL_TABLET | Freq: Three times a day (TID) | ORAL | 0 refills | Status: DC | PRN

## 2019-05-31 ENCOUNTER — Ambulatory Visit
Admission: RE | Admit: 2019-05-31 | Discharge: 2019-05-31 | Disposition: A | Discharge status: Home / Self Care | Payer: Medicare Other | Source: Ambulatory Visit | Attending: Internal Medicine | Admitting: Internal Medicine

## 2019-05-31 ENCOUNTER — Other Ambulatory Visit: Payer: Self-pay

## 2019-05-31 DIAGNOSIS — Z1231 Encounter for screening mammogram for malignant neoplasm of breast: Secondary | ICD-10-CM

## 2019-06-11 HISTORY — PX: CATARACT EXTRACTION, BILATERAL: SHX1313

## 2019-06-12 ENCOUNTER — Ambulatory Visit (INDEPENDENT_AMBULATORY_CARE_PROVIDER_SITE_OTHER): Payer: Medicare Other | Admitting: Psychology

## 2019-06-12 DIAGNOSIS — F064 Anxiety disorder due to known physiological condition: Secondary | ICD-10-CM | POA: Diagnosis not present

## 2019-06-26 ENCOUNTER — Ambulatory Visit (INDEPENDENT_AMBULATORY_CARE_PROVIDER_SITE_OTHER): Payer: Medicare Other | Admitting: Psychology

## 2019-06-26 DIAGNOSIS — F064 Anxiety disorder due to known physiological condition: Secondary | ICD-10-CM

## 2019-07-03 DIAGNOSIS — H35371 Puckering of macula, right eye: Secondary | ICD-10-CM | POA: Insufficient documentation

## 2019-07-05 ENCOUNTER — Other Ambulatory Visit: Payer: Self-pay | Admitting: Internal Medicine

## 2019-07-05 ENCOUNTER — Other Ambulatory Visit: Payer: Self-pay | Admitting: Neurology

## 2019-07-10 ENCOUNTER — Ambulatory Visit: Payer: Medicare Other | Admitting: Psychology

## 2019-07-11 ENCOUNTER — Other Ambulatory Visit: Payer: Self-pay | Admitting: Neurology

## 2019-07-17 ENCOUNTER — Ambulatory Visit (INDEPENDENT_AMBULATORY_CARE_PROVIDER_SITE_OTHER): Payer: Medicare Other | Admitting: Psychology

## 2019-07-17 DIAGNOSIS — F064 Anxiety disorder due to known physiological condition: Secondary | ICD-10-CM

## 2019-07-19 ENCOUNTER — Other Ambulatory Visit: Payer: Self-pay | Admitting: Internal Medicine

## 2019-07-21 ENCOUNTER — Other Ambulatory Visit: Payer: Self-pay | Admitting: Neurology

## 2019-07-24 ENCOUNTER — Ambulatory Visit: Payer: Medicare Other | Admitting: Psychology

## 2019-07-24 ENCOUNTER — Encounter: Payer: Self-pay | Admitting: Internal Medicine

## 2019-07-24 ENCOUNTER — Other Ambulatory Visit: Payer: Self-pay | Admitting: Neurology

## 2019-07-24 ENCOUNTER — Other Ambulatory Visit: Payer: Self-pay

## 2019-07-24 MED ORDER — HYDROCODONE-ACETAMINOPHEN 5-325 MG PO TABS: 1.0000 | ORAL_TABLET | Freq: Three times a day (TID) | ORAL | 0 refills | Status: DC | PRN

## 2019-07-24 MED ORDER — FAMOTIDINE 20 MG PO TABS: 20.0000 mg | ORAL_TABLET | Freq: Two times a day (BID) | ORAL | 1 refills | Status: DC

## 2019-07-24 MED ORDER — QC LORATADINE-D 10-240 MG PO TB24: 1.0000 | ORAL_TABLET | Freq: Every day | ORAL | 2 refills | Status: DC

## 2019-07-24 NOTE — Telephone Encounter (Signed)
1) Medication(s) Requested (by name): HYDROcodone-acetaminophen (NORCO/VICODIN) 5-325 MG tablet   2) Pharmacy of Choice: [id number] - [place], [address] [place] [address]

## 2019-08-07 ENCOUNTER — Ambulatory Visit: Payer: Medicare Other | Admitting: Psychology

## 2019-08-21 ENCOUNTER — Ambulatory Visit (INDEPENDENT_AMBULATORY_CARE_PROVIDER_SITE_OTHER): Payer: Medicare Other | Admitting: Psychology

## 2019-08-21 DIAGNOSIS — F064 Anxiety disorder due to known physiological condition: Secondary | ICD-10-CM

## 2019-09-04 ENCOUNTER — Ambulatory Visit: Payer: Medicare Other | Admitting: Psychology

## 2019-09-05 ENCOUNTER — Ambulatory Visit (INDEPENDENT_AMBULATORY_CARE_PROVIDER_SITE_OTHER): Payer: Medicare Other | Admitting: Neurology

## 2019-09-05 ENCOUNTER — Other Ambulatory Visit: Payer: Self-pay

## 2019-09-05 ENCOUNTER — Encounter: Payer: Self-pay | Admitting: Neurology

## 2019-09-05 VITALS — BP 139/80 | HR 80 | Temp 97.8°F | Ht 66.8 in | Wt 225.0 lb

## 2019-09-05 DIAGNOSIS — G35 Multiple sclerosis: Secondary | ICD-10-CM | POA: Diagnosis not present

## 2019-09-05 DIAGNOSIS — G4700 Insomnia, unspecified: Secondary | ICD-10-CM | POA: Diagnosis not present

## 2019-09-05 DIAGNOSIS — R208 Other disturbances of skin sensation: Secondary | ICD-10-CM

## 2019-09-05 DIAGNOSIS — R260 Ataxic gait: Secondary | ICD-10-CM

## 2019-09-05 DIAGNOSIS — R5383 Other fatigue: Secondary | ICD-10-CM

## 2019-09-05 MED ORDER — TIZANIDINE HCL 2 MG PO TABS: ORAL_TABLET | ORAL | 11 refills | Status: DC

## 2019-09-05 MED ORDER — HYDROCODONE-ACETAMINOPHEN 5-325 MG PO TABS: 1.0000 | ORAL_TABLET | Freq: Three times a day (TID) | ORAL | 0 refills | Status: DC | PRN

## 2019-09-05 NOTE — Progress Notes (Signed)
[facility]  PATIENT: Grace [name] DOB: [birth year]  REFERRING DOCTOR OR PCP:  [name] SOURCE: patient  _________________________________   HISTORICAL  CHIEF COMPLAINT:  Chief Complaint  Patient presents with  . Follow-up    RM 12 with husband. Last seen 03/06/2019. She fell in January, tripped over cord. She also fell thanksgiving day.   . Multiple Sclerosis    not on DMT  . Depression/anxiety    Takes sertraline  . Insomnia    Takes lorazepam    HISTORY OF PRESENT ILLNESS:  Grace [name] is a 60 y.o. woman with relapsing remitting multiple sclerosis.     Update 09/05/2019: She has MS.   She is not on any DMT as her course has been mild.  She did have a possible small exacerbation in 2019 and DMTs have been discussed.    Gait is doing the same.   She feels balance is mildly off and had one fall.  She sometimes bumps into things but notes eye issues.    She has some pins/needleds tingling and numbness, worse with heat.   Strength is about the same --- mild reduced strength is noted in hands at times.  She has some urinary urgency and Kegel exercises were recommended to her by gynecology.   She is sleeping poorly, both sleep onset and maintenance.    She takes melatonin, doxepin, baclofen and tizandine, gabapentin and hydrocodone at night.  If her mind is racing, she will take lorazepam.   She has recent cataract surgery and had some complications.   She also has a h/o optic neuritis.    Update 03/06/19: She has MS and is not on a DMT.   She did have an exacerbation in 2019 but chose not to start a DMT.    She had optic neuritis near the onset.   She is needing to have cataract surgery and we discussed no contraindications from MS viewpoint.   SHe is walking about the same with mild reduced balance.  Leg strength is good.  She has numbness in her fingers and toes.    She rarely has a whole body jerk but less than last year.   She has Lhermitte  sign.     She has had increased BP off/on.    Weight is stable.     She has trouble with insomnia, both sleep onset and maintenance.    She takes doxepin.   She takes zolpidem some nights with benefit.    She feels her depression is worse.  She notes a lot of stress with her husband's illness.  We discussed referral to behavioral health.  Update 06/01/2018: She feels her MS is stable.   She has not had any new symptoms since the last visit but had an exacerbation May 2019 (spinal).   She feels she has recovered.   She went on Aubagio but had LFT elevation and stopped.    Many years ago she was on Betaseron and Copaxone.    She was having propriospinal myoclonus and a Lhermitte sign at the onset of her exacerbation last year.    The myoclonus is better but the Lhermitte is the same.     She has right eye blurriness and was found to have an epiretinal membrane OU.    She also has had epidural steroid injections x 2 at [facility].  She recently got braces and notes more snoring.   She has had PSG in the past.  One  had mild OSA and another was normal.     Update 09/28/2017: At the last visit, after going many years without an exacerbation and being off of disease modifying therapies for long time, she had the onset of right-sided numbness and clumsiness and was found on MRI to have an acute lesion at C6-C7 (and 2 older lesions at C5 and T2) Aubagio was prescribed but she has not yet started due to insurance and co-pay issues.    She still has an intermittent Lhermitte sign and has some jerks in her limbs when drowsy.   The alprazolam did not help the jerks.      She is still noting a hot sensation all over with sweating and associated with elevated BP lasting 10-90 minutes.    These occur more at night.     She first noted this in February and when she saw Ortho, she was prescribed a steroid pack.    A few days after the steroid, symptoms recurred.    She was having more blurry vision and  saw Dr. Hassell Done at [facility].   A macular buckle was seen and she is being referred to a retina specialist.    Update 09/15/2017: In the past, she was on Betaseron and Copaxone.   She felt crazy on Copaxone and had trouble proessing.  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   She was diagnosed after ON in 1997 and had her big exacerbation in 2002 (C5).  She has had no DMT  Currently, she has numbness and clumsiness on the right.  The numbness has become very painful recently.  She feels it in both hands and feet but it is worse on the right.  When she is drowsy she gets jerks in bed.     She also notes a Lhermitte sign.   She feels her sleep is worse in general with insomnia.  I personally reviewed the MRI of the cervical spine from 06/24/2017.  It shows 2 old plaques, 1 posteriorly adjacent to C5 and another adjacent to T2.  Additionally there is a plaque adjacent to C6-C7 in the right posterior spinal cord.  There is subtle enhancement consistent with a more acute lesion.  This focus was not present on the previous MRI from 2016.   Update 04/28/2017:   She remains on a disease modifying therapy for her MS. She has not noted any definite exacerbations recently her fatigue is worse. MRIs have shown a few predominantly nonspecific white matter foci and there has been no change from 2013-2018. Although there are only a couple nonspecific foci of the brain, she does have a couple foci within the spinal cord prompting her diagnosis.]  Her gait is stable. She stumbles some but has no falls. Strength is fine. She often notes spasticity in the legs. Baclofen and tizanidine has helped some. She also has some burning dysesthetic pain in feet, and to a lesser extent in the hands. Unfortunately, she did not get much benefit from multiple medications including nortriptyline, gabapentin, Trileptal, Lamictal, Lyrica and tramadol.    She notes blurry  vision has worsened, right worse than left.  Colors are desaturated, more on the right.   Vision seems better in cold temperatures.  . Bladder function is fine.  Reports a lot of fatigue. She was on phentermine but had mild side effects and stopped.   She thinks it may have helped slightly.  Depression has done better on Lexapro. Depression worsened when her husband had a stroke in 2015.   From 11/08/2016: MS:   She denies recent exacerbation But notes more left facial pain and some vision blurring at times..    She was on Betaseron and Copaxone in the past.   She has not wanted to try any of the oral agents or IV drugs.   Her last MRI 2/18 and was unchanged from the previous 2 MRIs (2016 2013) and just shows a few fairly nonspecific foci.  She is noting more left facial pain, right facial pain near the ear and lower back.    MRI of the brain 06/2016 showed a few stable T2 spots which is fairly non-specific and unchanged compared to her previous MRI form 2016.     Facial Pain:   She was experiencing facial pain in the left cheek that fluctuated.  We started Keppra and it helped some.  \ Lower back pain:   She sees Dr. Lynann Bologna for her lower back pain and has had a bilateral L5S1 facet block.  She felt pain was better for a couple days but then pain increased when she caught her husband's fall a day later.  Pain has continued.  Gait/strength/sensation:  Gait is stable with some stumbles but no recent falls.   She notes a mild limp (but right hip has been hurting more).    Strength is fine.  She has mild spasticity, helped by baclofen and tizanidine (she alternates).     She has burning dysesthetic pain in the front of the legs and feet and in the the hands (lLeft = Right).   Nortriptyline, gabapentin and trileptal were not effective and Lamictal caused a rash.   Lyrica was not tolerated .   Tramadol was not well tolerated.    She reports mild left leg weakness (old) but notes her gait is slightly clumsy.     Spasticity is helped with soma, tizanidine and baclofen.   Vision:  She also is noting left eye pain and blurry vision but her eye exam was normal by ophthalmology.        Bladder: She notes urinary urgency and frequency.  Fatigue/sleep:   She reports a lot of physical and cognitive fatigue. Ritalin and Provigil have not helped her any. She also has sleep onset and sleep maintenance insomnia.   She takes muscle relaxants and doxepin at bedtime. She was on CPAP years ago but later PSG did not show OSA . Her periodic limb movements of sleep were improved with gabapentin.       Mood/cognition:  She feels her to depression has been worse. Her husband is disabled and she feels mood has been worse since his stroke. She has mild anxiety.   She was once on SSRI it is not sure if it helped her much.   She notes mild cognitive dysfunction.   She reports reduced short term memory and verbal fluency.   Ritalin did not help cognition much .     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 She had optic neuritis in 1999. In 2002, she had right-sided numbness. MRIs of the brain show just a couple of nonspecific foci. A cervical spine MRI showed a focus consistent with demyelination at C5. Additionally, there are 2 smaller foci at T2 and T3-T4.  CSF was also abnormal c/w MS.     She was initially  placed on Betaseron and then on Copaxone. She was unable to tolerate either of these and has not been on any DMT medication since 2003.   She retried Betaseron but stopped after diagnosis of avascular necrosis requiring hip replacement.    Of note, she had had only one course of IV steroids prior to that and has had some afterwards.   She had not wanted to go on Tysabri or Gilenya and opted to go on no disease modifying therapy since 2012. At her last visit on 01/17/2014, she decided to go on Aubagio but never started.     Most recent MRIs are from earlier this year and showed  no changes.   REVIEW OF SYSTEMS: Constitutional: No fevers, chills, sweats, or change in appetite.  Has fatigue Eyes: No visual changes, double vision, eye pain Ear, nose and throat: No hearing loss, ear pain, nasal congestion, sore throat Cardiovascular: No chest pain, palpitations Respiratory: No shortness of breath at rest or with exertion.   No wheezes GastrointestinaI: No nausea, vomiting, diarrhea, abdominal pain, fecal incontinence Genitourinary: No dysuria, urinary retention or frequency.  No nocturia. Musculoskeletal: She reports back pain and pain and some of her joints, especially her knees and hips. Integumentary: No rash, pruritus, skin lesions Neurological: as above.  She reports restless leg, insomnia and excessive daytime sleepiness. Psychiatric: No depression at this time.  No anxiety Endocrine: No palpitations, diaphoresis, change in appetite, change in weigh.  He notes heat intolerance and excessive thirst at times. Hematologic/Lymphatic: No anemia, purpura, petechiae. Allergic/Immunologic: No itchy/runny eyes, nasal congestion, recent allergic reactions, rashes  ALLERGIES: Allergies  Allergen Reactions  . Lamictal [Lamotrigine] Shortness Of Breath, Itching and Swelling    Tongue swells  . Mobic [Meloxicam] Anaphylaxis  . Ultram [Tramadol Hcl] Anaphylaxis  . Codeine Itching  . Diclofenac Itching  . Latex     Latex band-aids cause redness and tears your skin  . Nitrofurantoin     Causing itching all over, tongue swelling, chest heaviness, swollen lips, cough, rapid HR, swollen eyelids, red eyes/gums, lips red/white per pt.  . Teriflunomide Diarrhea    Hair loss, joint pains, elevated liver enzymes abugio (joint pains and liver issues)  . Trazodone And Nefazodone     Mouth sores, tongue swelling  . Tape Rash    HOME MEDICATIONS:  Current Outpatient Medications:  .  Ascorbic Acid (VITAMIN C) 1000 MG tablet, Take 1,000 mg by mouth daily., Disp: , Rfl:  .   aspirin EC 81 MG tablet, Take 81 mg by mouth daily., Disp: , Rfl:  .  baclofen (LIORESAL) 10 MG tablet, TAKE 2 TABLETS (20 MG TOTAL) BY MOUTH 3 (THREE) TIMES DAILY., Disp: 180 tablet, Rfl: 11 .  diphenhydrAMINE (BENADRYL) 25 MG tablet, Take 25 mg by mouth 3 (three) times daily., Disp: , Rfl:  .  doxepin (SINEQUAN) 10 MG capsule, TAKE 1 TO 2 CAPSULES AT BEDTIME, Disp: , Rfl:  .  etodolac (LODINE) 500 MG tablet, Take 500 mg by mouth. 1-2 times a day, Disp: , Rfl:  .  Evening Primrose Oil 1000 MG CAPS, , Disp: , Rfl:  .  famotidine (PEPCID) 20 MG tablet, Take 1 tablet (20 mg total) by mouth 2 (two) times daily., Disp: 180 tablet, Rfl: 1 .  gabapentin (NEURONTIN) 300 MG capsule, Take 1 capsule (300 mg total) by mouth 3 (three) times daily., Disp: 90 capsule, Rfl: 11 .  Grape Seed 100 MG CAPS, Take by mouth., Disp: , Rfl:  .  HYDROcodone-acetaminophen (  NORCO/VICODIN) 5-325 MG tablet, Take 1 tablet by mouth 3 (three) times daily as needed for moderate pain., Disp: 90 tablet, Rfl: 0 .  Lactobacillus (PROBIOTIC ACIDOPHILUS PO), Take by mouth., Disp: , Rfl:  .  levocetirizine (XYZAL) 5 MG tablet, TAKE 1 TABLET BY MOUTH EVERY DAY IN THE EVENING, Disp: 30 tablet, Rfl: 2 .  loratadine-pseudoephedrine (QC LORATADINE-D) 10-240 MG 24 hr tablet, Take 1 tablet by mouth daily., Disp: 90 tablet, Rfl: 2 .  LORazepam (ATIVAN) 1 MG tablet, Take 1 tablet (1 mg total) by mouth at bedtime., Disp: 30 tablet, Rfl: 5 .  Melatonin 10 MG TABS, Take 1 tablet by mouth at bedtime. , Disp: , Rfl:  .  rizatriptan (MAXALT-MLT) 10 MG disintegrating tablet, Take 1 tablet (10 mg total) by mouth as needed for migraine. May repeat in 2 hours if needed.  Max of 2 tablets in 24 hours, and 10 tablets per month, Disp: 10 tablet, Rfl: 11 .  sertraline (ZOLOFT) 50 MG tablet, TAKE 1 TABLET BY MOUTH EVERY DAY, Disp: 90 tablet, Rfl: 2 .  tiZANidine (ZANAFLEX) 2 MG tablet, Take up to 4 pills a day, Disp: 120 tablet, Rfl: 11 .  VITAMIN D PO, Take by  mouth daily., Disp: , Rfl:  .  Vitamin D, Ergocalciferol, (DRISDOL) 1.25 MG (50000 UT) CAPS capsule, ONE CAPSULE TWICE WEEKLY ON TUESDAYS/FRIDAYS, Disp: 24 capsule, Rfl: 2 .  vitamin E 400 UNIT capsule, Take 400 Units by mouth daily., Disp: , Rfl:  .  zolpidem (AMBIEN) 10 MG tablet, Take 10 mg by mouth at bedtime as needed. For sleep, Disp: , Rfl:  .  Flaxseed, Linseed, (FLAXSEED OIL) 1000 MG CAPS, , Disp: , Rfl:  .  Turmeric 1053 MG TABS, Take 1,053 mg by mouth daily., Disp: , Rfl:   PAST MEDICAL HISTORY: Past Medical History:  Diagnosis Date  . Allergy   . Anxiety   . Avascular necrosis ([place])   . Colon polyp    pre-cancerous  . Complication of anesthesia    patient states she requires more anesthesia  . Disorder of soft tissue   . Diverticulosis   . GERD (gastroesophageal reflux disease)   . H/O breast biopsy    pre- cancerous cells  . Hand fracture, left    9/87  . Heart palpitations   . History of Holter monitoring   . Hyperlipidemia   . Lhermitte's syndrome   . Migraines   . Multiple sclerosis ([place])    dx in 11/1996  . Multiple sclerosis ([place])   . Neuromuscular disorder ([place])   . Neuropathy   . Numbness and tingling in hands   . Obesity   . OSA (obstructive sleep apnea)    no longer have OSA since stopped taking a MS medication  . Otitis media   . Raynaud's phenomenon   . Sleep apnea    Due to medication that she no longer takes.  . Vaginitis and vulvovaginitis   . Vitamin D deficiency     PAST SURGICAL HISTORY: Past Surgical History:  Procedure Laterality Date  . ABDOMINAL HYSTERECTOMY Bilateral 02/12/2016   Procedure: HYSTERECTOMY ABDOMINAL WITH BILATERAL SALPINGO OOPHERECTOMY;  Surgeon: Dian Queen, MD;  Location: [facility];  Service: Gynecology;  Laterality: Bilateral;  . BREAST EXCISIONAL BIOPSY     core/left breast  . CATARACT EXTRACTION, BILATERAL  06/2019  . CHOLECYSTECTOMY    . COLONOSCOPY     1997/2001  . DILATION AND CURETTAGE OF UTERUS    .  ENDOMETRIAL ABLATION    .  LAPAROSCOPIC ABDOMINAL EXPLORATION     ovaries and intestines bond together  . LEFT HEART CATHETERIZATION WITH CORONARY ANGIOGRAM N/A 06/21/2011   Procedure: LEFT HEART CATHETERIZATION WITH CORONARY ANGIOGRAM;  Surgeon: Leonie Man, MD;  Location: [facility] CATH [facility];  Service: Cardiovascular;  Laterality: N/A;  . LUMBAR PUNCTURE     11/01/1996  . SALPINGOOPHORECTOMY Bilateral 02/12/2016   Procedure: BILATERAL SALPINGO OOPHORECTOMY;  Surgeon: Dian Queen, MD;  Location: [facility];  Service: Gynecology;  Laterality: Bilateral;  . TOTAL HIP ARTHROPLASTY     left hip  6/02  /right hip12/04    FAMILY HISTORY: Family History  Problem Relation Age of Onset  . Pancreatic cancer Mother   . Stroke Mother   . Testicular cancer Brother   . Colon cancer Maternal Uncle   . Pancreatic cancer Paternal Uncle   . Prostate cancer Maternal Grandfather   . Colon polyps Father     SOCIAL HISTORY:  Social History   Socioeconomic History  . Marital status: Married    Spouse name: Not on file  . Number of children: Not on file  . Years of education: Not on file  . Highest education level: Not on file  Occupational History  . Occupation: disabled  Tobacco Use  . Smoking status: Former Smoker    Packs/day: 0.25    Years: 8.00    Pack years: 2.00    Types: Cigarettes    Quit date: 07/01/1995    Years since quitting: 24.1  . Smokeless tobacco: Never Used  Substance and Sexual Activity  . Alcohol use: No    Alcohol/week: 0.0 standard drinks  . Drug use: Yes    Types: Hydrocodone  . Sexual activity: Not Currently  Other Topics Concern  . Not on file  Social History Narrative  . Not on file   Social Determinants of Health   Financial Resource Strain:   . Difficulty of Paying Living Expenses:   Food Insecurity: Food Insecurity Present  . Worried About Charity fundraiser in the Last Year: Sometimes true  . Ran Out of Food in the Last Year: Sometimes true   Transportation Needs:   . Lack of Transportation (Medical):   [name] Kitchen Lack of Transportation (Non-Medical):   Physical Activity:   . Days of Exercise per Week:   . Minutes of Exercise per Session:   Stress: Stress Concern Present  . Feeling of Stress : Very much  Social Connections:   . Frequency of Communication with Friends and Family:   . Frequency of Social Gatherings with Friends and Family:   . Attends Religious Services:   . Active Member of Clubs or Organizations:   . Attends Archivist Meetings:   [name] Kitchen Marital Status:   Intimate Partner Violence:   . Fear of Current or Ex-Partner:   . Emotionally Abused:   [name] Kitchen Physically Abused:   . Sexually Abused:      PHYSICAL EXAM  Vitals:   09/05/19 1420  BP: 139/80  Pulse: 80  Temp: 97.8 F (36.6 C)  Weight: 225 lb (102.1 kg)  Height: 5' 6.8" (1.697 m)    Body mass index is 35.45 kg/m.   General: The patient is well-developed and well-nourished and in no acute distress  Neurologic Exam  Mental status: The patient is alert and oriented x 3 at the time of the examination. The patient has apparent normal recent and remote memory, with an apparently normal attention span and concentration ability.   Speech is normal.  Cranial nerves: Extraocular movements are full.  She has reduced color vision on the right.  Facial strength and sensation was normal.. No obvious hearing deficits are noted.  Motor:  Muscle bulk is normal.   She has increased muscle tone in the legs, left greater than right.  Strength is 5/5 except for the left 4+/5 left EHL  Sensory: Sensory testing shows reduced touch and vibration sensation on the right side.. Sensation was normal on the left.  Coordination: Cerebellar testing reveals good finger-nose-finger and mildly reduced bilateral heel-to-shin  Gait and station: Station is normal.  The gait is mildly wide.  Tandem gait is poor.  Romberg is negative.   Reflexes: Deep tendon reflexes are  symmetric and normal bilaterally.         ASSESSMENT AND PLAN  Multiple sclerosis (HCC)  Insomnia, unspecified type  Dysesthesia  Ataxic gait  Other fatigue    1.   She would prefer to remain off and DMT for her MS. 2     Continue sertraline for depression and anxiety.   3.   Try to be more active and exercises.  Try to lose weight 4.     Hydrocodone up to 3 times a day for pain.  The PDMP was reviewed and she is not getting opiates from other doctors.  She does not show any drug-seeking behavior. 5.  She will return to see me and 5 months or sooner if she has new or worsening neurologic symptoms.    Salbador Fiveash A. Felecia Shelling, MD, PhD [id number], [id number] PM Certified in Neurology, Clinical Neurophysiology, Sleep Medicine, Pain Medicine and Neuroimaging  [facility] [address], [place], [place] [address] [phone]

## 2019-09-10 ENCOUNTER — Telehealth: Payer: Self-pay

## 2019-09-10 NOTE — Telephone Encounter (Signed)
The pt was notified that Dr. Baird Cancer got a notice that it's a drug interaction between the tizanidine and the famotidine medications.  The pt said that she will stop the famotidine because she doesn't really have heart burn.

## 2019-09-10 NOTE — Telephone Encounter (Signed)
I called the pt to notify her that Dr. Baird Cancer said that the pt can't take both the tizanidine and the famotidine at the same time because it is a drug interaction between the 2 medications.

## 2019-09-18 ENCOUNTER — Ambulatory Visit (INDEPENDENT_AMBULATORY_CARE_PROVIDER_SITE_OTHER): Payer: Medicare Other | Admitting: Psychology

## 2019-09-18 DIAGNOSIS — F064 Anxiety disorder due to known physiological condition: Secondary | ICD-10-CM

## 2019-10-02 ENCOUNTER — Ambulatory Visit: Payer: Medicare Other | Admitting: Psychology

## 2019-10-10 ENCOUNTER — Ambulatory Visit (INDEPENDENT_AMBULATORY_CARE_PROVIDER_SITE_OTHER): Payer: Medicare Other

## 2019-10-10 ENCOUNTER — Other Ambulatory Visit: Payer: Self-pay

## 2019-10-10 ENCOUNTER — Encounter: Payer: Self-pay | Admitting: Internal Medicine

## 2019-10-10 ENCOUNTER — Ambulatory Visit (INDEPENDENT_AMBULATORY_CARE_PROVIDER_SITE_OTHER): Payer: Medicare Other | Admitting: Internal Medicine

## 2019-10-10 VITALS — BP 126/90 | HR 91 | Temp 98.7°F | Ht 67.0 in | Wt 223.6 lb

## 2019-10-10 VITALS — BP 126/90 | HR 91 | Temp 98.7°F | Ht 66.8 in | Wt 223.6 lb

## 2019-10-10 DIAGNOSIS — R030 Elevated blood-pressure reading, without diagnosis of hypertension: Secondary | ICD-10-CM

## 2019-10-10 DIAGNOSIS — Z0000 Encounter for general adult medical examination without abnormal findings: Secondary | ICD-10-CM

## 2019-10-10 DIAGNOSIS — R438 Other disturbances of smell and taste: Secondary | ICD-10-CM

## 2019-10-10 DIAGNOSIS — Z6835 Body mass index (BMI) 35.0-35.9, adult: Secondary | ICD-10-CM

## 2019-10-10 DIAGNOSIS — Z79899 Other long term (current) drug therapy: Secondary | ICD-10-CM

## 2019-10-10 DIAGNOSIS — E66812 Obesity, class 2: Secondary | ICD-10-CM

## 2019-10-10 DIAGNOSIS — G35 Multiple sclerosis: Secondary | ICD-10-CM

## 2019-10-10 DIAGNOSIS — F3342 Major depressive disorder, recurrent, in full remission: Secondary | ICD-10-CM | POA: Diagnosis not present

## 2019-10-10 DIAGNOSIS — G373 Acute transverse myelitis in demyelinating disease of central nervous system: Secondary | ICD-10-CM

## 2019-10-10 DIAGNOSIS — E559 Vitamin D deficiency, unspecified: Secondary | ICD-10-CM

## 2019-10-10 DIAGNOSIS — G35D Multiple sclerosis, unspecified: Secondary | ICD-10-CM

## 2019-10-10 NOTE — Patient Instructions (Addendum)
Sertraline: cut back to 1/2 tablet daily Email Dr. Baird Cancer on [place] in two weeks to let me know how you are doing   Healthy Eating Following a healthy eating pattern may help you to achieve and maintain a healthy body weight, reduce the risk of chronic disease, and live a long and productive life. It is important to follow a healthy eating pattern at an appropriate calorie level for your body. Your nutritional needs should be met primarily through food by choosing a variety of nutrient-rich foods. What are tips for following this plan? Reading food labels  Read labels and choose the following: ? Reduced or low sodium. ? Juices with 100% fruit juice. ? Foods with low saturated fats and high polyunsaturated and monounsaturated fats. ? Foods with whole grains, such as whole wheat, cracked wheat, brown rice, and wild rice. ? Whole grains that are fortified with folic acid. This is recommended for women who are pregnant or who want to become pregnant.  Read labels and avoid the following: ? Foods with a lot of added sugars. These include foods that contain brown sugar, corn sweetener, corn syrup, dextrose, fructose, glucose, high-fructose corn syrup, honey, invert sugar, lactose, malt syrup, maltose, molasses, raw sugar, sucrose, trehalose, or turbinado sugar.  Do not eat more than the following amounts of added sugar per day:  6 teaspoons (25 g) for women.  9 teaspoons (38 g) for men. ? Foods that contain processed or refined starches and grains. ? Refined grain products, such as white flour, degermed cornmeal, white bread, and white rice. Shopping  Choose nutrient-rich snacks, such as vegetables, whole fruits, and nuts. Avoid high-calorie and high-sugar snacks, such as potato chips, fruit snacks, and candy.  Use oil-based dressings and spreads on foods instead of solid fats such as butter, stick margarine, or cream cheese.  Limit pre-made sauces, mixes, and "instant" products such as  flavored rice, instant noodles, and ready-made pasta.  Try more plant-protein sources, such as tofu, tempeh, black beans, edamame, lentils, nuts, and seeds.  Explore eating plans such as the Mediterranean diet or vegetarian diet. Cooking  Use oil to saut or stir-fry foods instead of solid fats such as butter, stick margarine, or lard.  Try baking, boiling, grilling, or broiling instead of frying.  Remove the fatty part of meats before cooking.  Steam vegetables in water or broth. Meal planning   At meals, imagine dividing your plate into fourths: ? One-half of your plate is fruits and vegetables. ? One-fourth of your plate is whole grains. ? One-fourth of your plate is protein, especially lean meats, poultry, eggs, tofu, beans, or nuts.  Include low-fat dairy as part of your daily diet. Lifestyle  Choose healthy options in all settings, including home, work, school, restaurants, or stores.  Prepare your food safely: ? Wash your hands after handling raw meats. ? Keep food preparation surfaces clean by regularly washing with hot, soapy water. ? Keep raw meats separate from ready-to-eat foods, such as fruits and vegetables. ? Cook seafood, meat, poultry, and eggs to the recommended internal temperature. ? Store foods at safe temperatures. In general:  Keep cold foods at 65F (4.4C) or below.  Keep hot foods at 165F (60C) or above.  Keep your freezer at [facility] (-17.8C) or below.  Foods are no longer safe to eat when they have been between the temperatures of 40-165F (4.4-60C) for more than 2 hours. What foods should I eat? Fruits Aim to eat 2 cup-equivalents of fresh, canned (in natural  juice), or frozen fruits each day. Examples of 1 cup-equivalent of fruit include 1 small apple, 8 large strawberries, 1 cup canned fruit,  cup dried fruit, or 1 cup 100% juice. Vegetables Aim to eat 2-3 cup-equivalents of fresh and frozen vegetables each day, including different  varieties and colors. Examples of 1 cup-equivalent of vegetables include 2 medium carrots, 2 cups raw, leafy greens, 1 cup chopped vegetable (raw or cooked), or 1 medium baked potato. Grains Aim to eat 6 ounce-equivalents of whole grains each day. Examples of 1 ounce-equivalent of grains include 1 slice of bread, 1 cup ready-to-eat cereal, 3 cups popcorn, or  cup cooked rice, pasta, or cereal. Meats and other proteins Aim to eat 5-6 ounce-equivalents of protein each day. Examples of 1 ounce-equivalent of protein include 1 egg, 1/2 cup nuts or seeds, or 1 tablespoon (16 g) peanut butter. A cut of meat or fish that is the size of a deck of cards is about 3-4 ounce-equivalents.  Of the protein you eat each week, try to have at least 8 ounces come from seafood. This includes salmon, trout, herring, and anchovies. Dairy Aim to eat 3 cup-equivalents of fat-free or low-fat dairy each day. Examples of 1 cup-equivalent of dairy include 1 cup (240 mL) milk, 8 ounces (250 g) yogurt, 1 ounces (44 g) natural cheese, or 1 cup (240 mL) fortified soy milk. Fats and oils  Aim for about 5 teaspoons (21 g) per day. Choose monounsaturated fats, such as canola and olive oils, avocados, peanut butter, and most nuts, or polyunsaturated fats, such as sunflower, corn, and soybean oils, walnuts, pine nuts, sesame seeds, sunflower seeds, and flaxseed. Beverages  Aim for six 8-oz glasses of water per day. Limit coffee to three to five 8-oz cups per day.  Limit caffeinated beverages that have added calories, such as soda and energy drinks.  Limit alcohol intake to no more than 1 drink a day for nonpregnant women and 2 drinks a day for men. One drink equals 12 oz of beer (355 mL), 5 oz of wine (148 mL), or 1 oz of hard liquor (44 mL). Seasoning and other foods  Avoid adding excess amounts of salt to your foods. Try flavoring foods with herbs and spices instead of salt.  Avoid adding sugar to foods.  Try using  oil-based dressings, sauces, and spreads instead of solid fats. This information is based on general [place] nutrition guidelines. For more information, visit [employer]. Exact amounts may vary based on your nutrition needs. Summary  A healthy eating plan may help you to maintain a healthy weight, reduce the risk of chronic diseases, and stay active throughout your life.  Plan your meals. Make sure you eat the right portions of a variety of nutrient-rich foods.  Try baking, boiling, grilling, or broiling instead of frying.  Choose healthy options in all settings, including home, work, school, restaurants, or stores. This information is not intended to replace advice given to you by your health care provider. Make sure you discuss any questions you have with your health care provider. Document Revised: 08/08/2017 Document Reviewed: 08/08/2017 Elsevier Patient Education  [place].

## 2019-10-10 NOTE — Progress Notes (Signed)
This visit occurred during the SARS-CoV-2 public health emergency.  Safety protocols were in place, including screening questions prior to the visit, additional usage of staff PPE, and extensive cleaning of exam room while observing appropriate contact time as indicated for disinfecting solutions.  Subjective:   Grace [name] is a 59 y.o. female who presents for Medicare Annual (Subsequent) preventive examination.  Review of Systems:  n/a Cardiac Risk Factors include: obesity (BMI >30kg/m2);sedentary lifestyle     Objective:     Vitals: BP 126/90 (Patient Position: Sitting)   Pulse 91   Temp 98.7 F (37.1 C) (Oral)   Ht 5\' 7"  (1.702 m)   Wt 223 lb 9.6 oz (101.4 kg)   LMP 04/09/2013   BMI 35.02 kg/m   Body mass index is 35.02 kg/m.  Advanced Directives 10/10/2019 03/13/2019 04/27/2018 06/30/2016 02/12/2016 02/04/2016 06/21/2011  Does Patient Have a Medical Advance Directive? No No No No No No Patient does not have advance directive  Would patient like information on creating a medical advance directive? Yes (MAU/Ambulatory/Procedural Areas - Information given) - Yes (MAU/Ambulatory/Procedural Areas - Information given) Yes (MAU/Ambulatory/Procedural Areas - Information given) No - patient declined information Yes - Educational materials given -    Tobacco Social History   Tobacco Use  Smoking Status Former Smoker  . Packs/day: 0.25  . Years: 8.00  . Pack years: 2.00  . Types: Cigarettes  . Quit date: 07/01/1995  . Years since quitting: 24.2  Smokeless Tobacco Never Used     Counseling given: Not Answered   Clinical Intake:  Pre-visit preparation completed: Yes  Pain : No/denies pain     Nutritional Status: BMI > 30  Obese Nutritional Risks: Nausea/ vomitting/ diarrhea(daily since January) Diabetes: No  How often do you need to have someone help you when you read instructions, pamphlets, or other written materials from your doctor or pharmacy?: 1 - Never What  is the last grade level you completed in school?: 12th grade  Interpreter Needed?: No  Information entered by :: NAllen LPN  Past Medical History:  Diagnosis Date  . Allergy   . Anxiety   . Avascular necrosis ([place])   . Colon polyp    pre-cancerous  . Complication of anesthesia    patient states she requires more anesthesia  . Disorder of soft tissue   . Diverticulosis   . GERD (gastroesophageal reflux disease)   . H/O breast biopsy    pre- cancerous cells  . Hand fracture, left    9/87  . Heart palpitations   . History of Holter monitoring   . Hyperlipidemia   . Lhermitte's syndrome   . Migraines   . Multiple sclerosis ([place])    dx in 11/1996  . Multiple sclerosis ([place])   . Neuromuscular disorder ([place])   . Neuropathy   . Numbness and tingling in hands   . Obesity   . OSA (obstructive sleep apnea)    no longer have OSA since stopped taking a MS medication  . Otitis media   . Raynaud's phenomenon   . Sleep apnea    Due to medication that she no longer takes.  . Vaginitis and vulvovaginitis   . Vitamin D deficiency    Past Surgical History:  Procedure Laterality Date  . ABDOMINAL HYSTERECTOMY Bilateral 02/12/2016   Procedure: HYSTERECTOMY ABDOMINAL WITH BILATERAL SALPINGO OOPHERECTOMY;  Surgeon: Dian Queen, MD;  Location: [facility];  Service: Gynecology;  Laterality: Bilateral;  . BREAST EXCISIONAL BIOPSY     core/left  breast  . CATARACT EXTRACTION, BILATERAL  06/2019  . CHOLECYSTECTOMY    . COLONOSCOPY     1997/2001  . DILATION AND CURETTAGE OF UTERUS    . ENDOMETRIAL ABLATION    . LAPAROSCOPIC ABDOMINAL EXPLORATION     ovaries and intestines bond together  . LEFT HEART CATHETERIZATION WITH CORONARY ANGIOGRAM N/A 06/21/2011   Procedure: LEFT HEART CATHETERIZATION WITH CORONARY ANGIOGRAM;  Surgeon: Leonie Man, MD;  Location: [facility] CATH LAB;  Service: Cardiovascular;  Laterality: N/A;  . LUMBAR PUNCTURE     11/01/1996  . SALPINGOOPHORECTOMY Bilateral  02/12/2016   Procedure: BILATERAL SALPINGO OOPHORECTOMY;  Surgeon: Dian Queen, MD;  Location: [facility];  Service: Gynecology;  Laterality: Bilateral;  . TOTAL HIP ARTHROPLASTY     left hip  6/02  /right hip12/04   Family History  Problem Relation Age of Onset  . Pancreatic cancer Mother   . Stroke Mother   . Testicular cancer Brother   . Colon cancer Maternal Uncle   . Pancreatic cancer Paternal Uncle   . Prostate cancer Maternal Grandfather   . Colon polyps Father    Social History   Socioeconomic History  . Marital status: Married    Spouse name: Not on file  . Number of children: Not on file  . Years of education: Not on file  . Highest education level: Not on file  Occupational History  . Occupation: disabled  Tobacco Use  . Smoking status: Former Smoker    Packs/day: 0.25    Years: 8.00    Pack years: 2.00    Types: Cigarettes    Quit date: 07/01/1995    Years since quitting: 24.2  . Smokeless tobacco: Never Used  Substance and Sexual Activity  . Alcohol use: No    Alcohol/week: 0.0 standard drinks  . Drug use: Yes    Types: Hydrocodone  . Sexual activity: Not Currently  Other Topics Concern  . Not on file  Social History Narrative  . Not on file   Social Determinants of Health   Financial Resource Strain: Low Risk   . Difficulty of Paying Living Expenses: Not hard at all  Food Insecurity: No Food Insecurity  . Worried About Charity fundraiser in the Last Year: Never true  . Ran Out of Food in the Last Year: Never true  Transportation Needs: No Transportation Needs  . Lack of Transportation (Medical): No  . Lack of Transportation (Non-Medical): No  Physical Activity: Inactive  . Days of Exercise per Week: 0 days  . Minutes of Exercise per Session: 0 min  Stress: No Stress Concern Present  . Feeling of Stress : Not at all  Social Connections:   . Frequency of Communication with Friends and Family:   . Frequency of Social Gatherings with Friends and  Family:   . Attends Religious Services:   . Active Member of Clubs or Organizations:   . Attends Archivist Meetings:   [name] Kitchen Marital Status:     Outpatient Encounter Medications as of 10/10/2019  Medication Sig  . Ascorbic Acid (VITAMIN C) 1000 MG tablet Take 1,000 mg by mouth daily.  [name] Kitchen aspirin EC 81 MG tablet Take 81 mg by mouth daily.  . baclofen (LIORESAL) 10 MG tablet TAKE 2 TABLETS (20 MG TOTAL) BY MOUTH 3 (THREE) TIMES DAILY.  . brimonidine-timolol (COMBIGAN) 0.2-0.5 % ophthalmic solution Place 1 drop into the right eye every 12 hours.  . diphenhydrAMINE (BENADRYL) 25 MG tablet Take 25 mg by  mouth 3 (three) times daily.  [name] Kitchen doxepin (SINEQUAN) 10 MG capsule TAKE 1 TO 2 CAPSULES AT BEDTIME  . etodolac (LODINE) 500 MG tablet Take 500 mg by mouth. 1-2 times a day  . Evening Primrose Oil 1000 MG CAPS   . famotidine (PEPCID) 20 MG tablet Take 1 tablet (20 mg total) by mouth 2 (two) times daily.  [name] Kitchen gabapentin (NEURONTIN) 300 MG capsule Take 1 capsule (300 mg total) by mouth 3 (three) times daily.  . Grape Seed 100 MG CAPS Take by mouth.  [name] Kitchen HYDROcodone-acetaminophen (NORCO/VICODIN) 5-325 MG tablet Take 1 tablet by mouth 3 (three) times daily as needed for moderate pain.  . Lactobacillus (PROBIOTIC ACIDOPHILUS PO) Take by mouth.  . levocetirizine (XYZAL) 5 MG tablet TAKE 1 TABLET BY MOUTH EVERY DAY IN THE EVENING  . loratadine-pseudoephedrine (QC LORATADINE-D) 10-240 MG 24 hr tablet Take 1 tablet by mouth daily.  [name] Kitchen LORazepam (ATIVAN) 1 MG tablet Take 1 tablet (1 mg total) by mouth at bedtime.  . Melatonin 10 MG TABS Take 1 tablet by mouth at bedtime.   . rizatriptan (MAXALT-MLT) 10 MG disintegrating tablet Take 1 tablet (10 mg total) by mouth as needed for migraine. May repeat in 2 hours if needed.  Max of 2 tablets in 24 hours, and 10 tablets per month  . sertraline (ZOLOFT) 50 MG tablet TAKE 1 TABLET BY MOUTH EVERY DAY  . tiZANidine (ZANAFLEX) 2 MG tablet Take up to 4 pills a day  .  VITAMIN D PO Take 1,000 Units by mouth daily.   . Vitamin D, Ergocalciferol, (DRISDOL) 1.25 MG (50000 UT) CAPS capsule ONE CAPSULE TWICE WEEKLY ON TUESDAYS/FRIDAYS  . vitamin E 400 UNIT capsule Take 400 Units by mouth daily.  [name] Kitchen zolpidem (AMBIEN) 10 MG tablet Take 10 mg by mouth at bedtime as needed. For sleep   No facility-administered encounter medications on file as of 10/10/2019.    Activities of Daily Living In your present state of health, do you have any difficulty performing the following activities: 10/10/2019 03/13/2019  Hearing? N N  Vision? Y Y  Comment out of right eye since surgery due to cataracts  Difficulty concentrating or making decisions? Tempie Donning  Comment sometimes sometimes forgetfulness  Walking or climbing stairs? Y Y  Comment sometimes a little  Dressing or bathing? N N  Doing errands, shopping? N N  Preparing Food and eating ? N N  Using the Toilet? N N  In the past six months, have you accidently leaked urine? Tempie Donning  Comment wears a pad sometimes -  Do you have problems with loss of bowel control? Y N  Comment since having diarrhea -  Managing your Medications? N N  Managing your Finances? N N  Housekeeping or managing your Housekeeping? N N  Some recent data might be hidden    Patient Care Team: [name], MD as PCP - General (Internal Medicine) Felecia Shelling, Nanine Means, MD (Neurology)    Assessment:   This is a routine wellness examination for [place].  Exercise Activities and Dietary recommendations Current Exercise Habits: The patient does not participate in regular exercise at present  Goals    . Exercise 150 min/wk Moderate Activity (pt-stated)    . Weight (lb) < 200 lb (90.7 kg)     10/10/2019, wants to lose 50 pounds       Fall Risk Fall Risk  10/10/2019 04/11/2019 03/13/2019 02/01/2019 01/03/2019  Falls in the past year? 1 1 0 0 0  Comment clumsy, has MS - - - -  Number falls in past yr: - 0 0 - -  Comment - - - - -  Injury with Fall? - 1 - - -    Comment - brusing - - -  Risk for fall due to : History of fall(s);Medication side effect - Medication side effect - -  Risk for fall due to: Comment - - - - -  Follow up Falls evaluation completed;Education provided;Falls prevention discussed - Falls evaluation completed;Education provided;Falls prevention discussed - -   Is the patient's home free of loose throw rugs in walkways, pet beds, electrical cords, etc?   yes      Grab bars in the bathroom? yes      Handrails on the stairs?   n/a      Adequate lighting?   yes  Timed Get Up and Go performed: n/a  Depression Screen PHQ 2/9 Scores 10/10/2019 03/13/2019 02/01/2019 01/03/2019  PHQ - 2 Score 1 2 0 0  PHQ- 9 Score 11 14 - -  Exception Documentation Other- indicate reason in comment box - - -     Cognitive Function     6CIT Screen 10/10/2019 03/13/2019 04/27/2018  What Year? 0 points 0 points 0 points  What month? 0 points 0 points 0 points  What time? 0 points 0 points 0 points  Count back from 20 0 points 0 points 0 points  Months in reverse 2 points 2 points 2 points  Repeat phrase 4 points 6 points 2 points  Total Score 6 8 4     Immunization History  Administered Date(s) Administered  . Tdap 03/21/2012    Qualifies for Shingles Vaccine? yes  Screening Tests Health Maintenance  Topic Date Due  . PAP SMEAR-Modifier  Never done  . COVID-19 Vaccine (1) 10/26/2019 (Originally 04/25/1973)  . HIV Screening  04/10/2020 (Originally [birth year]/24/1978)  . INFLUENZA VACCINE  12/09/2019  . MAMMOGRAM  05/30/2021  . TETANUS/TDAP  03/21/2022  . COLONOSCOPY  05/12/2023  . Hepatitis C Screening  Completed    Cancer Screenings: Lung: Low Dose CT Chest recommended if Age 49-80 years, 30 pack-year currently smoking OR have quit w/in 15years. Patient does not qualify. Breast:  Up to date on Mammogram? Yes   Up to date of Bone Density/Dexa? Yes Colorectal: up to date  Additional Screenings: : Hepatitis C Screening: 10/20/2005      Plan:    Patient wants to lose 50 pounds   I have personally reviewed and noted the following in the patient's chart:   . Medical and social history . Use of alcohol, tobacco or illicit drugs  . Current medications and supplements . Functional ability and status . Nutritional status . Physical activity . Advanced directives . List of other physicians . Hospitalizations, surgeries, and ER visits in previous 12 months . Vitals . Screenings to include cognitive, depression, and falls . Referrals and appointments  In addition, I have reviewed and discussed with patient certain preventive protocols, quality metrics, and best practice recommendations. A written personalized care plan for preventive services as well as general preventive health recommendations were provided to patient.     Kellie Simmering, LPN  [phone]

## 2019-10-10 NOTE — Progress Notes (Signed)
This visit occurred during the SARS-CoV-2 public health emergency.  Safety protocols were in place, including screening questions prior to the visit, additional usage of staff PPE, and extensive cleaning of exam room while observing appropriate contact time as indicated for disinfecting solutions.  Subjective:     Patient ID: Grace [name] , female    DOB: [birth year] , 59 y.o.   MRN: [id number]   Chief Complaint  Patient presents with  . Medication Refill    HPI  She presents for six month f/u and med refill. Chart review reveals muscle relaxers refilled by Neuro and allergy meds refilled in March by my office. She reports compliance with meds. Her MS is being managed by Neuro, she is not on any DMT therapy.    Past Medical History:  Diagnosis Date  . Allergy   . Anxiety   . Avascular necrosis ([place])   . Colon polyp    pre-cancerous  . Complication of anesthesia    patient states she requires more anesthesia  . Disorder of soft tissue   . Diverticulosis   . GERD (gastroesophageal reflux disease)   . H/O breast biopsy    pre- cancerous cells  . Hand fracture, left    9/87  . Heart palpitations   . History of Holter monitoring   . Hyperlipidemia   . Lhermitte's syndrome   . Migraines   . Multiple sclerosis ([place])    dx in 11/1996  . Multiple sclerosis ([place])   . Neuromuscular disorder ([place])   . Neuropathy   . Numbness and tingling in hands   . Obesity   . OSA (obstructive sleep apnea)    no longer have OSA since stopped taking a MS medication  . Otitis media   . Raynaud's phenomenon   . Sleep apnea    Due to medication that she no longer takes.  . Vaginitis and vulvovaginitis   . Vitamin D deficiency      Family History  Problem Relation Age of Onset  . Pancreatic cancer Mother   . Stroke Mother   . Testicular cancer Brother   . Colon cancer Maternal Uncle   . Pancreatic cancer Paternal Uncle   . Prostate cancer Maternal Grandfather   . Colon polyps  Father      Current Outpatient Medications:  .  Ascorbic Acid (VITAMIN C) 1000 MG tablet, Take 1,000 mg by mouth daily., Disp: , Rfl:  .  aspirin EC 81 MG tablet, Take 81 mg by mouth daily., Disp: , Rfl:  .  baclofen (LIORESAL) 10 MG tablet, TAKE 2 TABLETS (20 MG TOTAL) BY MOUTH 3 (THREE) TIMES DAILY., Disp: 180 tablet, Rfl: 11 .  brimonidine-timolol (COMBIGAN) 0.2-0.5 % ophthalmic solution, Place 1 drop into the right eye every 12 hours., Disp: , Rfl:  .  Cholecalciferol 25 MCG (1000 UT) capsule, Take by mouth., Disp: , Rfl:  .  Cinnamon 500 MG capsule, Take by mouth., Disp: , Rfl:  .  diphenhydrAMINE (BENADRYL) 25 MG tablet, Take 25 mg by mouth 3 (three) times daily., Disp: , Rfl:  .  doxepin (SINEQUAN) 10 MG capsule, TAKE 1 TO 2 CAPSULES AT BEDTIME, Disp: , Rfl:  .  etodolac (LODINE) 500 MG tablet, Take 500 mg by mouth. 1-2 times a day, Disp: , Rfl:  .  Evening Primrose Oil 1000 MG CAPS, , Disp: , Rfl:  .  famotidine (PEPCID) 20 MG tablet, Take 1 tablet (20 mg total) by mouth 2 (two) times daily., Disp: 180  tablet, Rfl: 1 .  Flaxseed, Linseed, (FLAX SEED OIL) 1000 MG CAPS, Take by mouth., Disp: , Rfl:  .  gabapentin (NEURONTIN) 300 MG capsule, Take 1 capsule (300 mg total) by mouth 3 (three) times daily., Disp: 90 capsule, Rfl: 11 .  Grape Seed 100 MG CAPS, Take by mouth., Disp: , Rfl:  .  HYDROcodone-acetaminophen (NORCO/VICODIN) 5-325 MG tablet, Take 1 tablet by mouth 3 (three) times daily as needed for moderate pain., Disp: 90 tablet, Rfl: 0 .  L-Lysine 1000 MG TABS, Take by mouth., Disp: , Rfl:  .  Lactobacillus (PROBIOTIC ACIDOPHILUS PO), Take by mouth., Disp: , Rfl:  .  levocetirizine (XYZAL) 5 MG tablet, TAKE 1 TABLET BY MOUTH EVERY DAY IN THE EVENING, Disp: 30 tablet, Rfl: 2 .  loratadine-pseudoephedrine (QC LORATADINE-D) 10-240 MG 24 hr tablet, Take 1 tablet by mouth daily., Disp: 90 tablet, Rfl: 2 .  LORazepam (ATIVAN) 1 MG tablet, Take 1 tablet (1 mg total) by mouth at bedtime.,  Disp: 30 tablet, Rfl: 5 .  Melatonin 10 MG TABS, Take 1 tablet by mouth at bedtime. , Disp: , Rfl:  .  Multiple Vitamins-Minerals (HAIR/SKIN/NAILS/BIOTIN) TABS, Take by mouth., Disp: , Rfl:  .  rizatriptan (MAXALT-MLT) 10 MG disintegrating tablet, Take 1 tablet (10 mg total) by mouth as needed for migraine. May repeat in 2 hours if needed.  Max of 2 tablets in 24 hours, and 10 tablets per month, Disp: 10 tablet, Rfl: 11 .  sertraline (ZOLOFT) 50 MG tablet, TAKE 1 TABLET BY MOUTH EVERY DAY, Disp: 90 tablet, Rfl: 2 .  tiZANidine (ZANAFLEX) 2 MG tablet, Take up to 4 pills a day, Disp: 120 tablet, Rfl: 11 .  VITAMIN D PO, Take 1,000 Units by mouth daily. , Disp: , Rfl:  .  vitamin E 400 UNIT capsule, Take 400 Units by mouth daily., Disp: , Rfl:  .  Vitamin D, Ergocalciferol, (DRISDOL) 1.25 MG (50000 UT) CAPS capsule, ONE CAPSULE TWICE WEEKLY ON TUESDAYS/FRIDAYS, Disp: 24 capsule, Rfl: 2 .  zolpidem (AMBIEN) 10 MG tablet, Take 10 mg by mouth at bedtime as needed. For sleep, Disp: , Rfl:    Allergies  Allergen Reactions  . Lamictal [Lamotrigine] Shortness Of Breath, Itching and Swelling    Tongue swells  . Mobic [Meloxicam] Anaphylaxis  . Ultram [Tramadol Hcl] Anaphylaxis  . Codeine Itching  . Diclofenac Itching  . Latex     Latex band-aids cause redness and tears your skin  . Nitrofurantoin     Causing itching all over, tongue swelling, chest heaviness, swollen lips, cough, rapid HR, swollen eyelids, red eyes/gums, lips red/white per pt.  . Teriflunomide Diarrhea    Hair loss, joint pains, elevated liver enzymes abugio (joint pains and liver issues)  . Trazodone And Nefazodone     Mouth sores, tongue swelling  . Tape Rash     Review of Systems  Constitutional: Negative.   HENT:       She c/o decreased taste. She has good smell. Had covid test which was neg. No recent cold sx.  Respiratory: Negative.   Cardiovascular: Negative.   Gastrointestinal: Negative.   Neurological: Negative.    Psychiatric/Behavioral: Negative.      Today's Vitals   10/10/19 1409  BP: 126/90  Pulse: 91  Temp: 98.7 F (37.1 C)  TempSrc: Oral  Weight: 223 lb 9.6 oz (101.4 kg)  Height: 5' 6.8" (1.697 m)  PainSc: 0-No pain   Body mass index is 35.23 kg/m.   Objective:  Physical Exam Vitals and nursing note reviewed.  Constitutional:      Appearance: Normal appearance. She is obese.  HENT:     Head: Normocephalic and atraumatic.  Cardiovascular:     Rate and Rhythm: Normal rate and regular rhythm.     Heart sounds: Normal heart sounds.  Pulmonary:     Effort: Pulmonary effort is normal.     Breath sounds: Normal breath sounds.  Skin:    General: Skin is warm.  Neurological:     General: No focal deficit present.     Mental Status: She is alert.  Psychiatric:        Mood and Affect: Mood normal.        Behavior: Behavior normal.         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 This is related to her MS.   2. Multiple sclerosis ([facility])  Chronic, as per Neuro.   3. Elevated blood pressure reading  I will check labs as listed below. She is encouraged to avoid adding salt to her foods. She agrees to rto in six to eight weeks for re-evaluation.   - CMP14+EGFR - Insulin, random(561)  4. Recurrent major depressive disorder, in full remission (H[facility])  Chronic, yet stable. She will continue with current meds. She reports her mood is stable, feels fatigue, sleep issues, appetite issues etc is related to her MS.   5. Hypogeusia  She agrees to ENT referral. She has been tested for COVID with repeated neg results.   - Ambulatory referral to ENT  6. Vitamin D deficiency disease  I WILL CHECK A VIT D LEVEL AND SUPPLEMENT AS NEEDED.  ALSO ENCOURAGED TO SPEND 15 MINUTES IN THE SUN DAILY.  - Vitamin D (25 hydroxy)  7. Class 2 severe obesity due to excess calories with serious comorbidity and body mass index  (BMI) of 35.0 to 35.9 in adult [facility])  Chronic. She was advised that her excess weight may be exacerbating her MS sx.  She is encouraged to strive for BMI less than 30 to decrease cardiac risk. Importance of regular exercise was discussed with the patient. She is encouraged to incorporate more exercise into her daily routine. She agrees to referral to [facility] clinic.   - Amb Ref to Medical Weight Management  8. Drug therapy  - Vitamin B12   [name], MD    THE PATIENT IS ENCOURAGED TO PRACTICE SOCIAL DISTANCING DUE TO THE COVID-19 PANDEMIC.

## 2019-10-10 NOTE — Patient Instructions (Signed)
Grace [name] , Thank you for taking time to come for your Medicare Wellness Visit. I appreciate your ongoing commitment to your health goals. Please review the following plan we discussed and let me know if I can assist you in the future.   Screening recommendations/referrals: Colonoscopy: 05/2018 Mammogram: 05/2019 Bone Density: 01/2011 Recommended yearly ophthalmology/optometry visit for glaucoma screening and checkup Recommended yearly dental visit for hygiene and checkup  Vaccinations: Influenza vaccine: decline Pneumococcal vaccine: decline Tdap vaccine: 03/2012 Shingles vaccine: discussed    Advanced directives: Advance directive discussed with you today. I have provided a copy for you to complete at home and have notarized. Once this is complete please bring a copy in to our office so we can scan it into your chart.  Conditions/risks identified: obesity  Next appointment: 04/16/2020 at 3:00  Preventive Care 40-64 Years, Female Preventive care refers to lifestyle choices and visits with your health care provider that can promote health and wellness. What does preventive care include?  A yearly physical exam. This is also called an annual well check.  Dental exams once or twice a year.  Routine eye exams. Ask your health care provider how often you should have your eyes checked.  Personal lifestyle choices, including:  Daily care of your teeth and gums.  Regular physical activity.  Eating a healthy diet.  Avoiding tobacco and drug use.  Limiting alcohol use.  Practicing safe sex.  Taking low-dose aspirin daily starting at age 40.  Taking vitamin and mineral supplements as recommended by your health care provider. What happens during an annual well check? The services and screenings done by your health care provider during your annual well check will depend on your age, overall health, lifestyle risk factors, and family history of disease. Counseling  Your health  care provider may ask you questions about your:  Alcohol use.  Tobacco use.  Drug use.  Emotional well-being.  Home and relationship well-being.  Sexual activity.  Eating habits.  Work and work Statistician.  Method of birth control.  Menstrual cycle.  Pregnancy history. Screening  You may have the following tests or measurements:  Height, weight, and BMI.  Blood pressure.  Lipid and cholesterol levels. These may be checked every 5 years, or more frequently if you are over 27 years old.  Skin check.  Lung cancer screening. You may have this screening every year starting at age 4 if you have a 30-pack-year history of smoking and currently smoke or have quit within the past 15 years.  Fecal occult blood test (FOBT) of the stool. You may have this test every year starting at age 58.  Flexible sigmoidoscopy or colonoscopy. You may have a sigmoidoscopy every 5 years or a colonoscopy every 10 years starting at age 77.  Hepatitis C blood test.  Hepatitis B blood test.  Sexually transmitted disease (STD) testing.  Diabetes screening. This is done by checking your blood sugar (glucose) after you have not eaten for a while (fasting). You may have this done every 1-3 years.  Mammogram. This may be done every 1-2 years. Talk to your health care provider about when you should start having regular mammograms. This may depend on whether you have a family history of breast cancer.  BRCA-related cancer screening. This may be done if you have a family history of breast, ovarian, tubal, or peritoneal cancers.  Pelvic exam and Pap test. This may be done every 3 years starting at age 22. Starting at age 18, this 39  be done every 5 years if you have a Pap test in combination with an HPV test.  Bone density scan. This is done to screen for osteoporosis. You may have this scan if you are at high risk for osteoporosis. Discuss your test results, treatment options, and if necessary, the  need for more tests with your health care provider. Vaccines  Your health care provider may recommend certain vaccines, such as:  Influenza vaccine. This is recommended every year.  Tetanus, diphtheria, and acellular pertussis (Tdap, Td) vaccine. You may need a Td booster every 10 years.  Zoster vaccine. You may need this after age 97.  Pneumococcal 13-valent conjugate (PCV13) vaccine. You may need this if you have certain conditions and were not previously vaccinated.  Pneumococcal polysaccharide (PPSV23) vaccine. You may need one or two doses if you smoke cigarettes or if you have certain conditions. Talk to your health care provider about which screenings and vaccines you need and how often you need them. This information is not intended to replace advice given to you by your health care provider. Make sure you discuss any questions you have with your health care provider. Document Released: 05/23/2015 Document Revised: 01/14/2016 Document Reviewed: 02/25/2015 Elsevier Interactive Patient Education  2017 [employer] Prevention in the Home Falls can cause injuries. They can happen to people of all ages. There are many things you can do to make your home safe and to help prevent falls. What can I do on the outside of my home?  Regularly fix the edges of walkways and driveways and fix any cracks.  Remove anything that might make you trip as you walk through a door, such as a raised step or threshold.  Trim any bushes or trees on the path to your home.  Use bright outdoor lighting.  Clear any walking paths of anything that might make someone trip, such as rocks or tools.  Regularly check to see if handrails are loose or broken. Make sure that both sides of any steps have handrails.  Any raised decks and porches should have guardrails on the edges.  Have any leaves, snow, or ice cleared regularly.  Use sand or salt on walking paths during winter.  Clean up any  spills in your garage right away. This includes oil or grease spills. What can I do in the bathroom?  Use night lights.  Install grab bars by the toilet and in the tub and shower. Do not use towel bars as grab bars.  Use non-skid mats or decals in the tub or shower.  If you need to sit down in the shower, use a plastic, non-slip stool.  Keep the floor dry. Clean up any water that spills on the floor as soon as it happens.  Remove soap buildup in the tub or shower regularly.  Attach bath mats securely with double-sided non-slip rug tape.  Do not have throw rugs and other things on the floor that can make you trip. What can I do in the bedroom?  Use night lights.  Make sure that you have a light by your bed that is easy to reach.  Do not use any sheets or blankets that are too big for your bed. They should not hang down onto the floor.  Have a firm chair that has side arms. You can use this for support while you get dressed.  Do not have throw rugs and other things on the floor that can make you trip.  What can I do in the kitchen?  Clean up any spills right away.  Avoid walking on wet floors.  Keep items that you use a lot in easy-to-reach places.  If you need to reach something above you, use a strong step stool that has a grab bar.  Keep electrical cords out of the way.  Do not use floor polish or wax that makes floors slippery. If you must use wax, use non-skid floor wax.  Do not have throw rugs and other things on the floor that can make you trip. What can I do with my stairs?  Do not leave any items on the stairs.  Make sure that there are handrails on both sides of the stairs and use them. Fix handrails that are broken or loose. Make sure that handrails are as long as the stairways.  Check any carpeting to make sure that it is firmly attached to the stairs. Fix any carpet that is loose or worn.  Avoid having throw rugs at the top or bottom of the stairs. If you  do have throw rugs, attach them to the floor with carpet tape.  Make sure that you have a light switch at the top of the stairs and the bottom of the stairs. If you do not have them, ask someone to add them for you. What else can I do to help prevent falls?  Wear shoes that:  Do not have high heels.  Have rubber bottoms.  Are comfortable and fit you well.  Are closed at the toe. Do not wear sandals.  If you use a stepladder:  Make sure that it is fully opened. Do not climb a closed stepladder.  Make sure that both sides of the stepladder are locked into place.  Ask someone to hold it for you, if possible.  Clearly mark and make sure that you can see:  Any grab bars or handrails.  First and last steps.  Where the edge of each step is.  Use tools that help you move around (mobility aids) if they are needed. These include:  Canes.  Walkers.  Scooters.  Crutches.  Turn on the lights when you go into a dark area. Replace any light bulbs as soon as they burn out.  Set up your furniture so you have a clear path. Avoid moving your furniture around.  If any of your floors are uneven, fix them.  If there are any pets around you, be aware of where they are.  Review your medicines with your doctor. Some medicines can make you feel dizzy. This can increase your chance of falling. Ask your doctor what other things that you can do to help prevent falls. This information is not intended to replace advice given to you by your health care provider. Make sure you discuss any questions you have with your health care provider. Document Released: 02/20/2009 Document Revised: 10/02/2015 Document Reviewed: 05/31/2014 Elsevier Interactive Patient Education  2017 [employer].

## 2019-10-11 ENCOUNTER — Other Ambulatory Visit: Payer: Self-pay | Admitting: *Deleted

## 2019-10-11 LAB — VITAMIN B12: Vitamin B-12: 249 pg/mL (ref 232–1245)

## 2019-10-11 LAB — CMP14+EGFR
ALT: 31 IU/L (ref 0–32)
AST: 32 IU/L (ref 0–40)
Albumin/Globulin Ratio: 1.9 (ref 1.2–2.2)
Albumin: 4.4 g/dL (ref 3.8–4.9)
Alkaline Phosphatase: 104 IU/L (ref 48–121)
BUN/Creatinine Ratio: 18 (ref 9–23)
BUN: 12 mg/dL (ref 6–24)
Bilirubin Total: 0.6 mg/dL (ref 0.0–1.2)
CO2: 21 mmol/L (ref 20–29)
Calcium: 9.3 mg/dL (ref 8.7–10.2)
Chloride: 106 mmol/L (ref 96–106)
Creatinine, Ser: 0.66 mg/dL (ref 0.57–1.00)
GFR calc Af Amer: 114 mL/min/{1.73_m2} (ref >59)
GFR calc non Af Amer: 99 mL/min/{1.73_m2} (ref >59)
Globulin, Total: 2.3 g/dL (ref 1.5–4.5)
Glucose: 99 mg/dL (ref 65–99)
Potassium: 4.3 mmol/L (ref 3.5–5.2)
Sodium: 143 mmol/L (ref 134–144)
Total Protein: 6.7 g/dL (ref 6.0–8.5)

## 2019-10-11 LAB — INSULIN, RANDOM: INSULIN: 29.8 u[IU]/mL — ABNORMAL HIGH (ref 2.6–24.9)

## 2019-10-11 LAB — VITAMIN D 25 HYDROXY (VIT D DEFICIENCY, FRACTURES): Vit D, 25-Hydroxy: 35.5 ng/mL (ref 30.0–100.0)

## 2019-10-11 MED ORDER — RIZATRIPTAN BENZOATE 10 MG PO TBDP: 10.0000 mg | ORAL_TABLET | ORAL | 11 refills | Status: DC | PRN

## 2019-10-16 ENCOUNTER — Encounter: Payer: Self-pay | Admitting: Internal Medicine

## 2019-10-16 ENCOUNTER — Telehealth: Payer: Self-pay

## 2019-10-16 ENCOUNTER — Ambulatory Visit: Payer: Medicare Other | Admitting: Psychology

## 2019-10-16 NOTE — Telephone Encounter (Signed)
I called the pt to schedule her appt for today on the nurse schedule to get her first of 4 weekly vitamin b12 injections.  The first 3 appointments will be on the nurse schedule and Dr. Baird Cancer wants the last appt for the pt to be scheduled with her.

## 2019-10-17 ENCOUNTER — Other Ambulatory Visit: Payer: Self-pay

## 2019-10-17 ENCOUNTER — Ambulatory Visit (INDEPENDENT_AMBULATORY_CARE_PROVIDER_SITE_OTHER): Payer: Medicare Other

## 2019-10-17 VITALS — BP 126/78 | HR 78 | Temp 98.0°F | Ht 67.0 in | Wt 223.0 lb

## 2019-10-17 DIAGNOSIS — E538 Deficiency of other specified B group vitamins: Secondary | ICD-10-CM | POA: Diagnosis not present

## 2019-10-17 DIAGNOSIS — E559 Vitamin D deficiency, unspecified: Secondary | ICD-10-CM

## 2019-10-17 MED ORDER — CYANOCOBALAMIN 1000 MCG/ML IJ SOLN
1000.0000 ug | Freq: Once | INTRAMUSCULAR | Status: AC
  Administered 2019-10-17: 1000 ug via INTRAMUSCULAR

## 2019-10-17 NOTE — Progress Notes (Signed)
Pt here for B12

## 2019-10-23 ENCOUNTER — Other Ambulatory Visit: Payer: Self-pay | Admitting: Internal Medicine

## 2019-10-24 ENCOUNTER — Other Ambulatory Visit: Payer: Self-pay

## 2019-10-24 ENCOUNTER — Ambulatory Visit (INDEPENDENT_AMBULATORY_CARE_PROVIDER_SITE_OTHER): Payer: Medicare Other

## 2019-10-24 VITALS — BP 122/84 | HR 79 | Temp 98.2°F | Ht 67.0 in | Wt 226.0 lb

## 2019-10-24 DIAGNOSIS — E538 Deficiency of other specified B group vitamins: Secondary | ICD-10-CM | POA: Diagnosis not present

## 2019-10-24 DIAGNOSIS — E559 Vitamin D deficiency, unspecified: Secondary | ICD-10-CM

## 2019-10-24 MED ORDER — CYANOCOBALAMIN 1000 MCG/ML IJ SOLN
1000.0000 ug | Freq: Once | INTRAMUSCULAR | Status: AC
  Administered 2019-10-24: 1000 ug via INTRAMUSCULAR

## 2019-10-24 NOTE — Progress Notes (Signed)
Pt presents for B-12

## 2019-10-30 ENCOUNTER — Ambulatory Visit: Payer: Medicare Other | Admitting: Psychology

## 2019-10-31 ENCOUNTER — Other Ambulatory Visit: Payer: Self-pay

## 2019-10-31 ENCOUNTER — Ambulatory Visit (INDEPENDENT_AMBULATORY_CARE_PROVIDER_SITE_OTHER): Payer: Medicare Other

## 2019-10-31 VITALS — BP 126/84 | HR 87 | Temp 98.1°F | Ht 67.0 in | Wt 224.8 lb

## 2019-10-31 DIAGNOSIS — E538 Deficiency of other specified B group vitamins: Secondary | ICD-10-CM | POA: Diagnosis not present

## 2019-10-31 MED ORDER — CYANOCOBALAMIN 1000 MCG/ML IJ SOLN
1000.0000 ug | Freq: Once | INTRAMUSCULAR | Status: AC
  Administered 2019-10-31: 1000 ug via INTRAMUSCULAR

## 2019-10-31 NOTE — Progress Notes (Signed)
Pt presents today for 3rd B-12 injection

## 2019-11-07 ENCOUNTER — Other Ambulatory Visit: Payer: Self-pay

## 2019-11-07 ENCOUNTER — Ambulatory Visit (INDEPENDENT_AMBULATORY_CARE_PROVIDER_SITE_OTHER): Payer: Medicare Other | Admitting: Internal Medicine

## 2019-11-07 ENCOUNTER — Encounter: Payer: Self-pay | Admitting: Internal Medicine

## 2019-11-07 VITALS — BP 120/84 | HR 85 | Temp 97.8°F | Ht 67.0 in | Wt 222.0 lb

## 2019-11-07 DIAGNOSIS — F3342 Major depressive disorder, recurrent, in full remission: Secondary | ICD-10-CM

## 2019-11-07 DIAGNOSIS — Z6834 Body mass index (BMI) 34.0-34.9, adult: Secondary | ICD-10-CM

## 2019-11-07 DIAGNOSIS — R438 Other disturbances of smell and taste: Secondary | ICD-10-CM

## 2019-11-07 DIAGNOSIS — G35 Multiple sclerosis: Secondary | ICD-10-CM | POA: Diagnosis not present

## 2019-11-07 DIAGNOSIS — E538 Deficiency of other specified B group vitamins: Secondary | ICD-10-CM

## 2019-11-07 DIAGNOSIS — E6609 Other obesity due to excess calories: Secondary | ICD-10-CM

## 2019-11-07 MED ORDER — CYANOCOBALAMIN 1000 MCG/ML IJ SOLN
1000.0000 ug | Freq: Once | INTRAMUSCULAR | Status: AC
  Administered 2019-11-07: 1000 ug via INTRAMUSCULAR

## 2019-11-07 NOTE — Progress Notes (Signed)
This visit occurred during the SARS-CoV-2 public health emergency.  Safety protocols were in place, including screening questions prior to the visit, additional usage of staff PPE, and extensive cleaning of exam room while observing appropriate contact time as indicated for disinfecting solutions.  Subjective:     Patient ID: Grace [name] , female    DOB: 23-May-1960 , 59 y.o.   MRN: [id number]   Chief Complaint  Patient presents with  . B12 Injection    HPI  She is here today for vitamin B12 injection. She denies feeling any different since starting the injections.     Past Medical History:  Diagnosis Date  . Allergy   . Anxiety   . Avascular necrosis ([place])   . Colon polyp    pre-cancerous  . Complication of anesthesia    patient states she requires more anesthesia  . Disorder of soft tissue   . Diverticulosis   . GERD (gastroesophageal reflux disease)   . H/O breast biopsy    pre- cancerous cells  . Hand fracture, left    9/87  . Heart palpitations   . History of Holter monitoring   . Hyperlipidemia   . Lhermitte's syndrome   . Migraines   . Multiple sclerosis ([place])    dx in 11/1996  . Multiple sclerosis ([place])   . Neuromuscular disorder ([place])   . Neuropathy   . Numbness and tingling in hands   . Obesity   . OSA (obstructive sleep apnea)    no longer have OSA since stopped taking a MS medication  . Otitis media   . Raynaud's phenomenon   . Sleep apnea    Due to medication that she no longer takes.  . Vaginitis and vulvovaginitis   . Vitamin D deficiency      Family History  Problem Relation Age of Onset  . Pancreatic cancer Mother   . Stroke Mother   . Testicular cancer Brother   . Colon cancer Maternal Uncle   . Pancreatic cancer Paternal Uncle   . Prostate cancer Maternal Grandfather   . Colon polyps Father      Current Outpatient Medications:  .  Ascorbic Acid (VITAMIN C) 1000 MG tablet, Take 1,000 mg by mouth daily., Disp: , Rfl:  .   aspirin EC 81 MG tablet, Take 81 mg by mouth daily., Disp: , Rfl:  .  baclofen (LIORESAL) 10 MG tablet, TAKE 2 TABLETS (20 MG TOTAL) BY MOUTH 3 (THREE) TIMES DAILY., Disp: 180 tablet, Rfl: 11 .  brimonidine-timolol (COMBIGAN) 0.2-0.5 % ophthalmic solution, Place 1 drop into the right eye every 12 hours., Disp: , Rfl:  .  Cholecalciferol 25 MCG (1000 UT) capsule, Take by mouth., Disp: , Rfl:  .  Cinnamon 500 MG capsule, Take by mouth., Disp: , Rfl:  .  diphenhydrAMINE (BENADRYL) 25 MG tablet, Take 25 mg by mouth 3 (three) times daily., Disp: , Rfl:  .  doxepin (SINEQUAN) 10 MG capsule, TAKE 1 TO 2 CAPSULES AT BEDTIME, Disp: , Rfl:  .  etodolac (LODINE) 500 MG tablet, Take 500 mg by mouth. 1-2 times a day, Disp: , Rfl:  .  Evening Primrose Oil 1000 MG CAPS, , Disp: , Rfl:  .  famotidine (PEPCID) 20 MG tablet, Take 1 tablet (20 mg total) by mouth 2 (two) times daily., Disp: 180 tablet, Rfl: 1 .  Flaxseed, Linseed, (FLAX SEED OIL) 1000 MG CAPS, Take by mouth., Disp: , Rfl:  .  gabapentin (NEURONTIN) 300 MG capsule,  Take 1 capsule (300 mg total) by mouth 3 (three) times daily., Disp: 90 capsule, Rfl: 11 .  Grape Seed 100 MG CAPS, Take by mouth., Disp: , Rfl:  .  HYDROcodone-acetaminophen (NORCO/VICODIN) 5-325 MG tablet, Take 1 tablet by mouth 3 (three) times daily as needed for moderate pain., Disp: 90 tablet, Rfl: 0 .  ipratropium (ATROVENT) 0.06 % nasal spray, Place 2 sprays into both nostrils 2 (two) times daily., Disp: , Rfl:  .  L-Lysine 1000 MG TABS, Take by mouth., Disp: , Rfl:  .  Lactobacillus (PROBIOTIC ACIDOPHILUS PO), Take by mouth., Disp: , Rfl:  .  levocetirizine (XYZAL) 5 MG tablet, TAKE 1 TABLET BY MOUTH EVERY DAY IN THE EVENING, Disp: 30 tablet, Rfl: 2 .  loratadine-pseudoephedrine (QC LORATADINE-D) 10-240 MG 24 hr tablet, Take 1 tablet by mouth daily., Disp: 90 tablet, Rfl: 2 .  LORazepam (ATIVAN) 1 MG tablet, Take 1 tablet (1 mg total) by mouth at bedtime., Disp: 30 tablet, Rfl: 5 .   Melatonin 10 MG TABS, Take 1 tablet by mouth at bedtime. , Disp: , Rfl:  .  Multiple Vitamins-Minerals (HAIR/SKIN/NAILS/BIOTIN) TABS, Take by mouth., Disp: , Rfl:  .  predniSONE (DELTASONE) 10 MG tablet, Take 10 mg by mouth daily with breakfast. 6 days dose pack, Disp: , Rfl:  .  rizatriptan (MAXALT-MLT) 10 MG disintegrating tablet, Take 1 tablet (10 mg total) by mouth as needed for migraine. May repeat in 2 hours if needed.  Max of 2 tablets in 24 hours, and 10 tablets per month, Disp: 10 tablet, Rfl: 11 .  sertraline (ZOLOFT) 50 MG tablet, TAKE 1 TABLET BY MOUTH EVERY DAY (Patient taking differently: 1/2 pill daily), Disp: 90 tablet, Rfl: 2 .  tiZANidine (ZANAFLEX) 2 MG tablet, Take up to 4 pills a day, Disp: 120 tablet, Rfl: 11 .  VITAMIN D PO, Take 1,000 Units by mouth daily. , Disp: , Rfl:  .  Vitamin D, Ergocalciferol, (DRISDOL) 1.25 MG (50000 UT) CAPS capsule, ONE CAPSULE TWICE WEEKLY ON TUESDAYS/FRIDAYS, Disp: 24 capsule, Rfl: 2 .  vitamin E 400 UNIT capsule, Take 400 Units by mouth daily., Disp: , Rfl:  .  zolpidem (AMBIEN) 10 MG tablet, Take 10 mg by mouth at bedtime as needed. For sleep, Disp: , Rfl:   Current Facility-Administered Medications:  .  cyanocobalamin ((VITAMIN B-12)) injection 1,000 mcg, 1,000 mcg, Intramuscular, Once, [name], MD   Allergies  Allergen Reactions  . Lamictal [Lamotrigine] Shortness Of Breath, Itching and Swelling    Tongue swells  . Mobic [Meloxicam] Anaphylaxis  . Ultram [Tramadol Hcl] Anaphylaxis  . Codeine Itching  . Diclofenac Itching  . Latex     Latex band-aids cause redness and tears your skin  . Nitrofurantoin     Causing itching all over, tongue swelling, chest heaviness, swollen lips, cough, rapid HR, swollen eyelids, red eyes/gums, lips red/white per pt.  . Teriflunomide Diarrhea    Hair loss, joint pains, elevated liver enzymes abugio (joint pains and liver issues)  . Trazodone And Nefazodone     Mouth sores, tongue swelling  .  Tape Rash     Review of Systems  Constitutional: Negative.   HENT:       She has persistent loss of taste. She has been repeatedly neg for COVId. Not sure what may have triggered her sx.  Respiratory: Negative.   Cardiovascular: Negative.   Gastrointestinal: Negative.   Neurological: Negative.   Psychiatric/Behavioral: Negative.      Today's Vitals  11/07/19 0925  BP: 120/84  Pulse: 85  Temp: 97.8 F (36.6 C)  TempSrc: Oral  Weight: 222 lb (100.7 kg)  Height: 5\' 7"  (1.702 m)   Body mass index is 34.77 kg/m.   Objective:  Physical Exam Vitals and nursing note reviewed.  Constitutional:      Appearance: Normal appearance.  HENT:     Head: Normocephalic and atraumatic.  Cardiovascular:     Rate and Rhythm: Normal rate and regular rhythm.     Heart sounds: Normal heart sounds.  Pulmonary:     Effort: Pulmonary effort is normal.     Breath sounds: Normal breath sounds.  Skin:    General: Skin is warm.  Neurological:     General: No focal deficit present.     Mental Status: She is alert.  Psychiatric:        Mood and Affect: Mood normal.        Behavior: Behavior normal.         Assessment And Plan:     1. Vitamin B 12 deficiency  She was given vitamin B12 IM  X1. She will continue with monthly injections for now.   - cyanocobalamin ((VITAMIN B-12)) injection 1,000 mcg  2. Hypogeusia  Possibly related to MS. May benefit from ENT evaluation.   3. Multiple sclerosis (HCC)  Chronic, also followed by Neurology. She agrees to [facility] referral.   - Referral to [facility] Care Management Services  4. Recurrent major depressive disorder, in full remission (HCC)  Chronic, yet stable. She will continue with current meds. She reports her life is more stable now. Her sx were exacerbated when her husband had suffered a stroke and her kids ewre   5. Class 1 obesity due to excess calories with serious comorbidity and body mass index (BMI) of 34.0 to 34.9 in adult  She  is encouraged to incorporate more activity into her daily routine. She is advised to aim for BMI less than 30 to decrease cardiac risk.   [name], MD    THE PATIENT IS ENCOURAGED TO PRACTICE SOCIAL DISTANCING DUE TO THE COVID-19 PANDEMIC.

## 2019-11-13 ENCOUNTER — Ambulatory Visit: Payer: Medicare Other | Admitting: Psychology

## 2019-11-14 ENCOUNTER — Other Ambulatory Visit: Payer: Self-pay | Admitting: Neurology

## 2019-11-14 ENCOUNTER — Telehealth: Payer: Self-pay

## 2019-11-14 NOTE — Telephone Encounter (Signed)
Grace [name] is a 60 y.o. female calling because she would like to know if she need more steroids via IV for her lost of taste.  Pt is requesting a call back

## 2019-11-14 NOTE — Telephone Encounter (Signed)
Called pt back to get further information. She lost her sense of taste back in November. She gained it back but then about a month ago, her taste went away. Nose was also running. She went and saw PCP who sent her to ENT. ENT felt it was nerve related. She was placed on 6 day steroid pack starting 11/07/19. She has gained some taste back but it has not completely resolved. She feels like she has a wet suite on, everything is very tight all over her body. This is worse at nighttime. Sx started about two weeks ago. She was also given a a nose spray for nasal drip which has helped. The past three to four days she has had trouble with word finding. She currently has braces and cannot do MRI right now. I scheduled f/u for her to see Dr. Felecia Shelling tomorrow at [place], check in 1030am. Pt verbalized understanding and is aware to bring insurance cards/med list with her.

## 2019-11-15 ENCOUNTER — Ambulatory Visit (INDEPENDENT_AMBULATORY_CARE_PROVIDER_SITE_OTHER): Payer: Medicare Other | Admitting: Neurology

## 2019-11-15 ENCOUNTER — Ambulatory Visit: Payer: Self-pay

## 2019-11-15 ENCOUNTER — Encounter: Payer: Self-pay | Admitting: Neurology

## 2019-11-15 ENCOUNTER — Other Ambulatory Visit: Payer: Self-pay

## 2019-11-15 VITALS — BP 129/82 | HR 73 | Ht 67.0 in | Wt 224.0 lb

## 2019-11-15 DIAGNOSIS — G35 Multiple sclerosis: Secondary | ICD-10-CM

## 2019-11-15 DIAGNOSIS — R439 Unspecified disturbances of smell and taste: Secondary | ICD-10-CM

## 2019-11-15 DIAGNOSIS — R208 Other disturbances of skin sensation: Secondary | ICD-10-CM

## 2019-11-15 DIAGNOSIS — E559 Vitamin D deficiency, unspecified: Secondary | ICD-10-CM

## 2019-11-15 DIAGNOSIS — R430 Anosmia: Secondary | ICD-10-CM | POA: Diagnosis not present

## 2019-11-15 DIAGNOSIS — G4700 Insomnia, unspecified: Secondary | ICD-10-CM

## 2019-11-15 MED ORDER — PREDNISONE 20 MG PO TABS
ORAL_TABLET | ORAL | 0 refills | Status: DC
Start: 2019-11-15 — End: 2020-01-01

## 2019-11-15 MED ORDER — HYDROCODONE-ACETAMINOPHEN 5-325 MG PO TABS: 1.0000 | ORAL_TABLET | Freq: Three times a day (TID) | ORAL | 0 refills | Status: DC | PRN

## 2019-11-15 NOTE — Progress Notes (Signed)
[facility]  PATIENT: Grace [name] DOB: 1960/07/09  REFERRING DOCTOR OR PCP:  [name] SOURCE: patient  _________________________________   HISTORICAL  CHIEF COMPLAINT:  Chief Complaint  Patient presents with  . Follow-up    RM 12, alone. Last seen 09/05/2019. Here to be evaluated for loss of taste. Has seen PCP/ENT already. Started predisone 11/06/19 and ended 11/11/19.   . Multiple Sclerosis    off DMT    HISTORY OF PRESENT ILLNESS:  Grace [name] is a 59 y.o. woman with relapsing remitting multiple sclerosis.     Update 11/15/19: She lost her taste and had a mild sinusitis at the time.   She saw ENT and had a scope study.   She was told she might be having some allergies (there was some discharge).   She also was told thid cold be a neurologic issue.   With a prednisone pack taste came back a little bit.    Otherwise, she feels she is fairly stable.  Specifically, gait is mildly off balance but stable.  She has no recent falls.  She will sometimes bump into things.  She has some pins-and-needles dysesthesias in the hands.  She continues to experience pain in the neck and back.  This is helped by gabapentin and hydrocodone.  She has insomnia that is helped by her medications but she still has a lot of sleep maintenance issues some nights.     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 She had optic neuritis in 1999. In 2002, she had right-sided numbness. MRIs of the brain show just a couple of nonspecific foci. A cervical spine MRI showed a focus consistent with demyelination at C5. Additionally, there are 2 smaller foci at T2 and T3-T4.  CSF was also abnormal c/w MS.     She was initially placed on Betaseron and then on Copaxone. She was unable to tolerate either of these and has not been on any DMT medication since 2003.   She retried Betaseron but stopped after diagnosis of avascular  necrosis requiring hip replacement.  She was on Copaxone for a while.   Of note, she had had only one course of IV steroids prior to that and has had some afterwards.   She had not wanted to go on Tysabri or Gilenya and opted to go on no disease modifying therapy since 2012. At her last visit on 01/17/2014, she decided to go on Aubagio but due to elevated liver function test stopped..     Most recent MRIs are from earlier this year and showed no changes.  She has been off of all disease modifying therapy since 2018.  02/05/2018: Brain MRI showed "Multiple T2/FLAIR hyperintense foci in the hemispheres.  This is a nonspecific finding and could be due to chronic microvascular ischemic changes or 2 demyelination.  None of the foci appears to be acute.  When compared to the MRI dated 07/03/2016, there is no interval change.  There is a normal enhancement pattern.   REVIEW OF SYSTEMS: Constitutional: No fevers, chills, sweats, or change in appetite.  Has fatigue Eyes: No visual changes, double vision, eye pain Ear, nose and throat: No hearing loss, ear pain, nasal congestion, sore throat Cardiovascular: No chest pain, palpitations Respiratory: No shortness of breath at rest or with exertion.   No wheezes GastrointestinaI: No nausea, vomiting, diarrhea, abdominal pain, fecal incontinence Genitourinary: No dysuria, urinary retention or  frequency.  No nocturia. Musculoskeletal: She reports back pain and pain and some of her joints, especially her knees and hips. Integumentary: No rash, pruritus, skin lesions Neurological: as above.  She reports restless leg, insomnia and excessive daytime sleepiness. Psychiatric: No depression at this time.  No anxiety Endocrine: No palpitations, diaphoresis, change in appetite, change in weigh.  He notes heat intolerance and excessive thirst at times. Hematologic/Lymphatic: No anemia, purpura, petechiae. Allergic/Immunologic: No itchy/runny eyes, nasal congestion,  recent allergic reactions, rashes  ALLERGIES: Allergies  Allergen Reactions  . Lamictal [Lamotrigine] Shortness Of Breath, Itching and Swelling    Tongue swells  . Mobic [Meloxicam] Anaphylaxis  . Ultram [Tramadol Hcl] Anaphylaxis  . Codeine Itching  . Diclofenac Itching  . Latex     Latex band-aids cause redness and tears your skin  . Nitrofurantoin     Causing itching all over, tongue swelling, chest heaviness, swollen lips, cough, rapid HR, swollen eyelids, red eyes/gums, lips red/white per pt.  . Teriflunomide Diarrhea    Hair loss, joint pains, elevated liver enzymes abugio (joint pains and liver issues)  . Trazodone And Nefazodone     Mouth sores, tongue swelling  . Tape Rash    HOME MEDICATIONS:  Current Outpatient Medications:  .  Ascorbic Acid (VITAMIN C) 1000 MG tablet, Take 1,000 mg by mouth daily., Disp: , Rfl:  .  aspirin EC 81 MG tablet, Take 81 mg by mouth daily., Disp: , Rfl:  .  baclofen (LIORESAL) 10 MG tablet, TAKE 2 TABLETS (20 MG TOTAL) BY MOUTH 3 (THREE) TIMES DAILY., Disp: 180 tablet, Rfl: 11 .  brimonidine-timolol (COMBIGAN) 0.2-0.5 % ophthalmic solution, Place 1 drop into the right eye every 12 hours., Disp: , Rfl:  .  Cholecalciferol 25 MCG (1000 UT) capsule, Take by mouth., Disp: , Rfl:  .  Cinnamon 500 MG capsule, Take by mouth., Disp: , Rfl:  .  diphenhydrAMINE (BENADRYL) 25 MG tablet, Take 25 mg by mouth 3 (three) times daily., Disp: , Rfl:  .  doxepin (SINEQUAN) 10 MG capsule, TAKE 1 TO 2 CAPSULES AT BEDTIME, Disp: 180 capsule, Rfl: 3 .  etodolac (LODINE) 500 MG tablet, Take 500 mg by mouth. 1-2 times a day, Disp: , Rfl:  .  Evening Primrose Oil 1000 MG CAPS, , Disp: , Rfl:  .  famotidine (PEPCID) 20 MG tablet, Take 1 tablet (20 mg total) by mouth 2 (two) times daily., Disp: 180 tablet, Rfl: 1 .  Flaxseed, Linseed, (FLAX SEED OIL) 1000 MG CAPS, Take by mouth., Disp: , Rfl:  .  gabapentin (NEURONTIN) 300 MG capsule, Take 1 capsule (300 mg total) by  mouth 3 (three) times daily., Disp: 90 capsule, Rfl: 11 .  Grape Seed 100 MG CAPS, Take by mouth., Disp: , Rfl:  .  HYDROcodone-acetaminophen (NORCO/VICODIN) 5-325 MG tablet, Take 1 tablet by mouth 3 (three) times daily as needed for moderate pain., Disp: 90 tablet, Rfl: 0 .  ipratropium (ATROVENT) 0.06 % nasal spray, Place 2 sprays into both nostrils 2 (two) times daily., Disp: , Rfl:  .  L-Lysine 1000 MG TABS, Take by mouth., Disp: , Rfl:  .  Lactobacillus (PROBIOTIC ACIDOPHILUS PO), Take by mouth., Disp: , Rfl:  .  levocetirizine (XYZAL) 5 MG tablet, TAKE 1 TABLET BY MOUTH EVERY DAY IN THE EVENING, Disp: 30 tablet, Rfl: 2 .  loratadine-pseudoephedrine (QC LORATADINE-D) 10-240 MG 24 hr tablet, Take 1 tablet by mouth daily., Disp: 90 tablet, Rfl: 2 .  LORazepam (ATIVAN) 1 MG  tablet, Take 1 tablet (1 mg total) by mouth at bedtime., Disp: 30 tablet, Rfl: 5 .  Melatonin 10 MG TABS, Take 1 tablet by mouth at bedtime. , Disp: , Rfl:  .  Multiple Vitamins-Minerals (HAIR/SKIN/NAILS/BIOTIN) TABS, Take by mouth., Disp: , Rfl:  .  rizatriptan (MAXALT-MLT) 10 MG disintegrating tablet, Take 1 tablet (10 mg total) by mouth as needed for migraine. May repeat in 2 hours if needed.  Max of 2 tablets in 24 hours, and 10 tablets per month, Disp: 10 tablet, Rfl: 11 .  sertraline (ZOLOFT) 50 MG tablet, TAKE 1 TABLET BY MOUTH EVERY DAY (Patient taking differently: 1/2 pill daily), Disp: 90 tablet, Rfl: 2 .  tiZANidine (ZANAFLEX) 2 MG tablet, Take up to 4 pills a day, Disp: 120 tablet, Rfl: 11 .  VITAMIN D PO, Take 1,000 Units by mouth daily. , Disp: , Rfl:  .  Vitamin D, Ergocalciferol, (DRISDOL) 1.25 MG (50000 UT) CAPS capsule, ONE CAPSULE TWICE WEEKLY ON TUESDAYS/FRIDAYS, Disp: 24 capsule, Rfl: 2 .  vitamin E 400 UNIT capsule, Take 400 Units by mouth daily., Disp: , Rfl:  .  zolpidem (AMBIEN) 10 MG tablet, Take 10 mg by mouth at bedtime as needed. For sleep, Disp: , Rfl:  .  predniSONE (DELTASONE) 20 MG tablet, Take  6 pills x 1d, then 5 pills po x 1d, then 4 pills po x 1d, then 3 pills po x 1d, then 2 pills po x 1d, then 1 pill po x 1 d, then 1/2 pill po x 1d, Disp: 22 tablet, Rfl: 0  PAST MEDICAL HISTORY: Past Medical History:  Diagnosis Date  . Allergy   . Anxiety   . Avascular necrosis ([place])   . Colon polyp    pre-cancerous  . Complication of anesthesia    patient states she requires more anesthesia  . Disorder of soft tissue   . Diverticulosis   . GERD (gastroesophageal reflux disease)   . H/O breast biopsy    pre- cancerous cells  . Hand fracture, left    9/87  . Heart palpitations   . History of Holter monitoring   . Hyperlipidemia   . Lhermitte's syndrome   . Migraines   . Multiple sclerosis ([place])    dx in 11/1996  . Multiple sclerosis ([place])   . Neuromuscular disorder ([place])   . Neuropathy   . Numbness and tingling in hands   . Obesity   . OSA (obstructive sleep apnea)    no longer have OSA since stopped taking a MS medication  . Otitis media   . Raynaud's phenomenon   . Sleep apnea    Due to medication that she no longer takes.  . Vaginitis and vulvovaginitis   . Vitamin D deficiency     PAST SURGICAL HISTORY: Past Surgical History:  Procedure Laterality Date  . ABDOMINAL HYSTERECTOMY Bilateral 02/12/2016   Procedure: HYSTERECTOMY ABDOMINAL WITH BILATERAL SALPINGO OOPHERECTOMY;  Surgeon: Dian Queen, MD;  Location: [facility];  Service: Gynecology;  Laterality: Bilateral;  . BREAST EXCISIONAL BIOPSY     core/left breast  . CATARACT EXTRACTION, BILATERAL  06/2019  . CHOLECYSTECTOMY    . COLONOSCOPY     1997/2001  . DILATION AND CURETTAGE OF UTERUS    . ENDOMETRIAL ABLATION    . LAPAROSCOPIC ABDOMINAL EXPLORATION     ovaries and intestines bond together  . LEFT HEART CATHETERIZATION WITH CORONARY ANGIOGRAM N/A 06/21/2011   Procedure: LEFT HEART CATHETERIZATION WITH CORONARY ANGIOGRAM;  Surgeon: Leonie Man,  MD;  Location: [facility];  Service: Cardiovascular;   Laterality: N/A;  . LUMBAR PUNCTURE     11/01/1996  . SALPINGOOPHORECTOMY Bilateral 02/12/2016   Procedure: BILATERAL SALPINGO OOPHORECTOMY;  Surgeon: Dian Queen, MD;  Location: [facility];  Service: Gynecology;  Laterality: Bilateral;  . TOTAL HIP ARTHROPLASTY     left hip  6/02  /right hip12/04    FAMILY HISTORY: Family History  Problem Relation Age of Onset  . Pancreatic cancer Mother   . Stroke Mother   . Testicular cancer Brother   . Colon cancer Maternal Uncle   . Pancreatic cancer Paternal Uncle   . Prostate cancer Maternal Grandfather   . Colon polyps Father     SOCIAL HISTORY:  Social History   Socioeconomic History  . Marital status: Married    Spouse name: Not on file  . Number of children: Not on file  . Years of education: Not on file  . Highest education level: Not on file  Occupational History  . Occupation: disabled  Tobacco Use  . Smoking status: Former Smoker    Packs/day: 0.25    Years: 8.00    Pack years: 2.00    Types: Cigarettes    Quit date: 07/01/1995    Years since quitting: 24.3  . Smokeless tobacco: Never Used  Vaping Use  . Vaping Use: Never used  Substance and Sexual Activity  . Alcohol use: No    Alcohol/week: 0.0 standard drinks  . Drug use: Yes    Types: Hydrocodone  . Sexual activity: Not Currently  Other Topics Concern  . Not on file  Social History Narrative  . Not on file   Social Determinants of Health   Financial Resource Strain: Low Risk   . Difficulty of Paying Living Expenses: Not hard at all  Food Insecurity: No Food Insecurity  . Worried About Charity fundraiser in the Last Year: Never true  . Ran Out of Food in the Last Year: Never true  Transportation Needs: No Transportation Needs  . Lack of Transportation (Medical): No  . Lack of Transportation (Non-Medical): No  Physical Activity: Inactive  . Days of Exercise per Week: 0 days  . Minutes of Exercise per Session: 0 min  Stress: No Stress Concern Present   . Feeling of Stress : Not at all  Social Connections:   . Frequency of Communication with Friends and Family:   . Frequency of Social Gatherings with Friends and Family:   . Attends Religious Services:   . Active Member of Clubs or Organizations:   . Attends Archivist Meetings:   [name] Kitchen Marital Status:   Intimate Partner Violence:   . Fear of Current or Ex-Partner:   . Emotionally Abused:   [name] Kitchen Physically Abused:   . Sexually Abused:      PHYSICAL EXAM  Vitals:   11/15/19 1035  BP: 129/82  Pulse: 73  Weight: 224 lb (101.6 kg)  Height: 5\' 7"  (1.702 m)    Body mass index is 35.08 kg/m.   General: The patient is well-developed and well-nourished and in no acute distress  Neurologic Exam  Mental status: The patient is alert and oriented x 3 at the time of the examination. The patient has apparent normal recent and remote memory, with an apparently normal attention span and concentration ability.   Speech is normal.  Cranial nerves: Extraocular movements are full.  She has reduced color vision on the right.  Facial strength and sensation was  normal.. No obvious hearing deficits are noted.  Motor:  Muscle bulk is normal.   She has increased muscle tone in the legs, left greater than right.  Strength is 5/5 except for the left 4+/5 left EHL  Sensory: Sensory testing shows reduced touch and vibration sensation on the right side.. Sensation was normal on the left.  Coordination: Cerebellar testing reveals good finger-nose-finger and mildly reduced bilateral heel-to-shin  Gait and station: Station is normal.  The gait is mildly wide.  Tandem gait is poor.  Romberg is negative.   Reflexes: Deep tendon reflexes are symmetric and normal bilaterally.         ASSESSMENT AND PLAN  Multiple sclerosis (HCC)  Dysesthesia  Anosmia  Disturbance of smell and taste   1.   Etiology of reduced smell is unclear.  Altered taste likely due to altered sensation of smell   She is  mildly better with steroids.  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   MS is unlikely the cause.   As came on suddenly, unlikely to be olfactory groove meningioma.   She has braces and an MRI would likely have a lot of artifact, especially the olfactory groove area near her teeth.   If worsens, consider CT to r/o meningioma in the olfactory groove.   2     Continue sertraline for depression and anxiety.   3.   She would prefer to remain off and DMT for her MS  4.     Hydrocodone up to 3 times a day for pain.  The PDMP was reviewed and she is not getting opiates from other doctors.  She does not show any drug-seeking behavior. 5.  She will return to see me in 3-4 months or sooner if she has new or worsening neurologic symptoms.    Ezmae Speers A. Felecia Shelling, MD, PhD 10/16/6293, 28:41 AM Certified in Neurology, Clinical Neurophysiology, Sleep Medicine, Pain Medicine and Neuroimaging  [facility] [address] [place], [place] [address] [phone]

## 2019-11-15 NOTE — Chronic Care Management (AMB) (Signed)
Chronic Care Management    Social Work General Note  11/15/2019 Name: Grace [name] MRN: [id number] DOB: 11/12/1960  Grace [name] is a 59 y.o. year old female who is a primary care patient of [name], MD. The CCM was consulted to assist the patient with care coordination and care management.   [name] was given information about Chronic Care Management services today including:  1. CCM service includes personalized support from designated clinical staff supervised by her physician, including individualized plan of care and coordination with other care providers 2. 24/7 contact phone numbers for assistance for urgent and routine care needs. 3. Service will only be billed when office clinical staff spend 20 minutes or more in a month to coordinate care. 4. Only one practitioner may furnish and bill the service in a calendar month. 5. The patient may stop CCM services at any time (effective at the end of the month) by phone call to the office staff. 6. The patient will be responsible for cost sharing (co-pay) of up to 20% of the service fee (after annual deductible is met).  Patient agreed to services and verbal consent obtained.   Review of patient status, including review of consultants reports, relevant laboratory and other test results, and collaboration with appropriate care team members and the patient's provider was performed as part of comprehensive patient evaluation and provision of chronic care management services.    SDOH (Social Determinants of Health) assessments and interventions performed:  Yes SDOH Interventions     Most Recent Value  SDOH Interventions  Housing Interventions Other (Comment)  [Follow up call scheduled to review resource opportunities]  Transportation Interventions Intervention Not Indicated       Outpatient Encounter Medications as of 11/15/2019  Medication Sig Note  . Ascorbic Acid (VITAMIN C) 1000 MG tablet Take 1,000 mg by  mouth daily.   [name] Kitchen aspirin EC 81 MG tablet Take 81 mg by mouth daily.   . baclofen (LIORESAL) 10 MG tablet TAKE 2 TABLETS (20 MG TOTAL) BY MOUTH 3 (THREE) TIMES DAILY.   . brimonidine-timolol (COMBIGAN) 0.2-0.5 % ophthalmic solution Place 1 drop into the right eye every 12 hours.   . Cholecalciferol 25 MCG (1000 UT) capsule Take by mouth.   . Cinnamon 500 MG capsule Take by mouth.   . diphenhydrAMINE (BENADRYL) 25 MG tablet Take 25 mg by mouth 3 (three) times daily.   [name] Kitchen doxepin (SINEQUAN) 10 MG capsule TAKE 1 TO 2 CAPSULES AT BEDTIME   . etodolac (LODINE) 500 MG tablet Take 500 mg by mouth. 1-2 times a day   . Evening Primrose Oil 1000 MG CAPS    . famotidine (PEPCID) 20 MG tablet Take 1 tablet (20 mg total) by mouth 2 (two) times daily.   . Flaxseed, Linseed, (FLAX SEED OIL) 1000 MG CAPS Take by mouth.   . gabapentin (NEURONTIN) 300 MG capsule Take 1 capsule (300 mg total) by mouth 3 (three) times daily. 03/08/2018: Rx placed up front for patient pick up 03/08/18 ELK,RN  . Grape Seed 100 MG CAPS Take by mouth.   [name] Kitchen HYDROcodone-acetaminophen (NORCO/VICODIN) 5-325 MG tablet Take 1 tablet by mouth 3 (three) times daily as needed for moderate pain.   [name] Kitchen ipratropium (ATROVENT) 0.06 % nasal spray Place 2 sprays into both nostrils 2 (two) times daily.   [name] Kitchen L-Lysine 1000 MG TABS Take by mouth.   . Lactobacillus (PROBIOTIC ACIDOPHILUS PO) Take by mouth.   . levocetirizine (XYZAL) 5 MG tablet TAKE 1  TABLET BY MOUTH EVERY DAY IN THE EVENING   . loratadine-pseudoephedrine (QC LORATADINE-D) 10-240 MG 24 hr tablet Take 1 tablet by mouth daily.   [name] Kitchen LORazepam (ATIVAN) 1 MG tablet Take 1 tablet (1 mg total) by mouth at bedtime.   . Melatonin 10 MG TABS Take 1 tablet by mouth at bedtime.    . Multiple Vitamins-Minerals (HAIR/SKIN/NAILS/BIOTIN) TABS Take by mouth.   . predniSONE (DELTASONE) 20 MG tablet Take 6 pills x 1d, then 5 pills po x 1d, then 4 pills po x 1d, then 3 pills po x 1d, then 2 pills po x 1d, then 1  pill po x 1 d, then 1/2 pill po x 1d   . rizatriptan (MAXALT-MLT) 10 MG disintegrating tablet Take 1 tablet (10 mg total) by mouth as needed for migraine. May repeat in 2 hours if needed.  Max of 2 tablets in 24 hours, and 10 tablets per month   . sertraline (ZOLOFT) 50 MG tablet TAKE 1 TABLET BY MOUTH EVERY DAY (Patient taking differently: 1/2 pill daily)   . tiZANidine (ZANAFLEX) 2 MG tablet Take up to 4 pills a day   . VITAMIN D PO Take 1,000 Units by mouth daily.    . Vitamin D, Ergocalciferol, (DRISDOL) 1.25 MG (50000 UT) CAPS capsule ONE CAPSULE TWICE WEEKLY ON TUESDAYS/FRIDAYS   . vitamin E 400 UNIT capsule Take 400 Units by mouth daily.   [name] Kitchen zolpidem (AMBIEN) 10 MG tablet Take 10 mg by mouth at bedtime as needed. For sleep    No facility-administered encounter medications on file as of 11/15/2019.    SW placed successful outbound call to the patient to introduce care management program and conduct an SDOH (social determinants of health) screen. Patient identified concern surrounding housing modification needs due to mold in the home as well as need for wheelchair ramp to allow her husband to safely enter/exit the home.   Follow Up Plan: Appointment scheduled for SW follow up with client by phone on: Wednesday July 14 to discuss resources available to assist with home modification needs. SW collaboration with Consulting civil engineer and embedded PharmD regarding patient enrollment status.       Daneen Schick, BSW, CDP Social Worker, Certified Dementia Practitioner [place] [facility] [place] [facility] [phone]

## 2019-11-16 ENCOUNTER — Telehealth: Payer: Self-pay | Admitting: Internal Medicine

## 2019-11-16 NOTE — Chronic Care Management (AMB) (Signed)
  Chronic Care Management   Note  11/16/2019 Name: Amila Callies MRN: [id number] DOB: 1961/05/08  Carmie Lanpher Shea is a 59 y.o. year old female who is a primary care patient of [name], MD and is actively engaged with the care management team. I reached out to [place] by phone today to assist with scheduling an initial visit with the Pharmacist.  Follow up plan: Unsuccessful telephone outreach attempt made. A HIPPA compliant phone message was left for the patient providing contact information and requesting a return call. The care management team will reach out to the patient again over the next 7 days. If patient returns call to provider office, please advise to call [place] at [phone].  [place], [place] [address] Direct Dial: [phone] [email or web address]: [email or web address]

## 2019-11-20 ENCOUNTER — Other Ambulatory Visit: Payer: Self-pay | Admitting: Neurology

## 2019-11-21 ENCOUNTER — Other Ambulatory Visit: Payer: Self-pay

## 2019-11-21 ENCOUNTER — Telehealth: Payer: Medicare Other

## 2019-11-21 ENCOUNTER — Other Ambulatory Visit: Payer: Self-pay | Admitting: Internal Medicine

## 2019-11-21 ENCOUNTER — Ambulatory Visit: Payer: Self-pay

## 2019-11-21 ENCOUNTER — Ambulatory Visit (INDEPENDENT_AMBULATORY_CARE_PROVIDER_SITE_OTHER): Payer: Medicare Other

## 2019-11-21 DIAGNOSIS — E559 Vitamin D deficiency, unspecified: Secondary | ICD-10-CM

## 2019-11-21 DIAGNOSIS — F3342 Major depressive disorder, recurrent, in full remission: Secondary | ICD-10-CM

## 2019-11-21 DIAGNOSIS — E6609 Other obesity due to excess calories: Secondary | ICD-10-CM

## 2019-11-21 DIAGNOSIS — G35 Multiple sclerosis: Secondary | ICD-10-CM

## 2019-11-21 DIAGNOSIS — Z6834 Body mass index (BMI) 34.0-34.9, adult: Secondary | ICD-10-CM

## 2019-11-21 NOTE — Chronic Care Management (AMB) (Signed)
Chronic Care Management   Initial Visit Note  11/21/2019 Name: Grace [name] MRN: [id number] DOB: 07-08-1960  Referred by: [name], MD Reason for referral : Chronic Care Management (RQ RN CM Case Collaboration )   Grace [name] is a 59 y.o. year old female who is a primary care patient of [name], MD. The care management team was consulted for assistance with chronic disease management and care coordination needs related to MS, Vitamin D Deficiency. .  Review of patient status, including review of consultants reports, relevant laboratory and other test results, and collaboration with appropriate care team members and the patient's provider was performed as part of comprehensive patient evaluation and provision of chronic care management services.    I initiated and established the plan of care for Grace [name] during one on one collaboration with my clinical care management colleague Daneen Schick BSW who is also engaged with this patient to address social work needs.   Outpatient Encounter Medications as of 11/21/2019  Medication Sig Note  . Ascorbic Acid (VITAMIN C) 1000 MG tablet Take 1,000 mg by mouth daily.   [name] Kitchen aspirin EC 81 MG tablet Take 81 mg by mouth daily.   . baclofen (LIORESAL) 10 MG tablet TAKE 2 TABLETS (20 MG TOTAL) BY MOUTH 3 (THREE) TIMES DAILY.   . brimonidine-timolol (COMBIGAN) 0.2-0.5 % ophthalmic solution Place 1 drop into the right eye every 12 hours.   . Cholecalciferol 25 MCG (1000 UT) capsule Take by mouth.   . Cinnamon 500 MG capsule Take by mouth.   . diphenhydrAMINE (BENADRYL) 25 MG tablet Take 25 mg by mouth 3 (three) times daily.   [name] Kitchen doxepin (SINEQUAN) 10 MG capsule TAKE 1 TO 2 CAPSULES AT BEDTIME   . etodolac (LODINE) 500 MG tablet Take 500 mg by mouth. 1-2 times a day   . Evening Primrose Oil 1000 MG CAPS    . famotidine (PEPCID) 20 MG tablet Take 1 tablet (20 mg total) by mouth 2 (two) times daily.   . Flaxseed,  Linseed, (FLAX SEED OIL) 1000 MG CAPS Take by mouth.   . gabapentin (NEURONTIN) 300 MG capsule Take 1 capsule (300 mg total) by mouth 3 (three) times daily. 03/08/2018: Rx placed up front for patient pick up 03/08/18 ELK,RN  . Grape Seed 100 MG CAPS Take by mouth.   [name] Kitchen HYDROcodone-acetaminophen (NORCO/VICODIN) 5-325 MG tablet Take 1 tablet by mouth 3 (three) times daily as needed for moderate pain.   [name] Kitchen ipratropium (ATROVENT) 0.06 % nasal spray Place 2 sprays into both nostrils 2 (two) times daily.   [name] Kitchen L-Lysine 1000 MG TABS Take by mouth.   . Lactobacillus (PROBIOTIC ACIDOPHILUS PO) Take by mouth.   . levocetirizine (XYZAL) 5 MG tablet TAKE 1 TABLET BY MOUTH EVERY DAY IN THE EVENING   . loratadine-pseudoephedrine (QC LORATADINE-D) 10-240 MG 24 hr tablet Take 1 tablet by mouth daily.   [name] Kitchen LORazepam (ATIVAN) 1 MG tablet Take 1 tablet (1 mg total) by mouth at bedtime.   . Melatonin 10 MG TABS Take 1 tablet by mouth at bedtime.    . Multiple Vitamins-Minerals (HAIR/SKIN/NAILS/BIOTIN) TABS Take by mouth.   . predniSONE (DELTASONE) 20 MG tablet Take 6 pills x 1d, then 5 pills po x 1d, then 4 pills po x 1d, then 3 pills po x 1d, then 2 pills po x 1d, then 1 pill po x 1 d, then 1/2 pill po x 1d   . rizatriptan (MAXALT-MLT) 10 MG disintegrating tablet  Take 1 tablet (10 mg total) by mouth as needed for migraine. May repeat in 2 hours if needed.  Max of 2 tablets in 24 hours, and 10 tablets per month   . sertraline (ZOLOFT) 50 MG tablet TAKE 1 TABLET BY MOUTH EVERY DAY (Patient taking differently: 1/2 pill daily)   . tiZANidine (ZANAFLEX) 2 MG tablet Take up to 4 pills a day   . VITAMIN D PO Take 1,000 Units by mouth daily.    . Vitamin D, Ergocalciferol, (DRISDOL) 1.25 MG (50000 UNIT) CAPS capsule ONE CAPSULE TWICE WEEKLY ON TUESDAYS/FRIDAYS   . vitamin E 400 UNIT capsule Take 400 Units by mouth daily.   [name] Kitchen zolpidem (AMBIEN) 10 MG tablet Take 10 mg by mouth at bedtime as needed. For sleep    No  facility-administered encounter medications on file as of 11/21/2019.     Goals Addressed    . Assist with Chronic Care Management and Care Coordination needs       CARE PLAN ENTRY (see longtitudinal plan of care for additional care plan information)   Current Barriers:  . Chronic Disease Management support, education, and care coordination needs related to MS, Vitamin D Deficiency  Case Manager Clinical Goal(s):  [name] Kitchen Over the next 30 days, patient will work with BSW to address needs related to [employer] in patient with MS, Vitamin D Deficiency  Interventions:  . Collaborated with BSW to initiate plan of care to address needs related to  [employer]  in patient with MS, Vitamin D Deficiency  Patient Self Care Activities:  . Self administers medications as prescribed . Attends all scheduled provider appointments . Calls provider office for new concerns or questions  Initial goal documentation         Telephone follow up appointment with care management team member scheduled for: 12/26/19  Barb Merino, RN, BSN, CCM Care Management Coordinator [place] [facility]  Direct Phone: [phone]

## 2019-11-21 NOTE — Chronic Care Management (AMB) (Signed)
  Care Management   Note  11/21/2019 Name: Grace [name] MRN: [id number] DOB: [birth year]  [name] is a 59 y.o. year old female who is a primary care patient of [name], MD and is actively engaged with the care management team. I reached out to [place] by phone today to assist with scheduling an initial visit with the Pharmacist.  Follow up plan: Unsuccessful telephone outreach attempt made. A HIPPA compliant phone message was left for the patient providing contact information and requesting a return call. The care management team will reach out to the patient again over the next 7 days. If patient returns call to provider office, please advise to call [place]  at [phone].  [place], [place] [address] Direct Dial: [phone] [email or web address] Website: [email or web address]

## 2019-11-21 NOTE — Patient Instructions (Signed)
Social Worker Visit Information  Goals we discussed today:  Goals Addressed            This Visit's Progress   . Collaborate with RN Care Manager to perform approrpiate assessments to assist with care coordination needs       CARE PLAN ENTRY (see longitudinal plan of care for additional care plan information)  Current Barriers:  . Reported mold in home seen on walls, ceiling, and some furniture . Financial constraints related to cost of mold remediation . Caregiver burnout related to ongoing care needs of patient spouse who suffered a stroke approximately 6 years ago causing left sided paralysis  . Lacks knowledge of community resources to assist with ramp Architect . Chronic conditions including MS, depression, and class 1 obesity which impact patient self health management  Social Work Clinical Goal(s):  [name] Kitchen Over the next 120 days the patient will work with SW to identify resources to assist with home repair and modification needs  CCM SW Interventions: Completed 11/21/19 . Inter-disciplinary care team collaboration (see longitudinal plan of care) . Collaboration with [employer] ([place]) to review patient resource needs related to wheelchair ramp for spouse as well as mold remediation o Determined [facility] does assist with ramps but is not able to assist with mold remediation . Collaboration with [employer] ([place]) to determine the patient is not eligible for services due to being under the age of 89 . Collaboration with Sharol Given with [facility] ([place]) to determine the patient is eligible to receive services based on location and housing repair and modification need o Referral successfully placed to [name] to outreach the patient to screen for program eligibility based on income requirements . Unsuccessful outbound call placed to the patient to communicate referral to [facility]; voice message left requesting a return  call . Mailed the patient information on [facility] to assist with transportation of spouse to alleviate caregiver stress surrounding appointment management . Scheduled follow up call to the patient over the next two weeks . Collaboration with RN Care Manager to inform of patient care coordination needs and plan  Patient Self Care Activities:  . Self administers medications as prescribed . Attends all scheduled provider appointments . Calls pharmacy for medication refills . Performs ADL's independently . Calls provider office for new concerns or questions  Initial goal documentation         Materials Provided: Yes: mailed resource information  Follow Up Plan: SW will follow up with patient by phone over the next two weeks   Daneen Schick, BSW, CDP Social Worker, Certified Dementia Practitioner [place] [facility] [place] [facility] [phone]

## 2019-11-21 NOTE — Chronic Care Management (AMB) (Signed)
Chronic Care Management   11/21/2019 Name: Grace [name] MRN: [id number] DOB: May 28, 1960  Referred by: Grace [name] Reason for referral : Grace [name] is a 59 y.o. year old female who is a primary care patient of Grace [name]. The CCM team was consulted for assistance with chronic disease management and care coordination needs related to care coordination.  Review of patient status, including review of consultants reports, relevant laboratory and other test results, and collaboration with appropriate care team members and the patient's provider was performed as part of comprehensive patient evaluation and provision of chronic care management services.    Medications: Outpatient Encounter Medications as of 11/21/2019  Medication Sig Note  . Ascorbic Acid (VITAMIN C) 1000 MG tablet Take 1,000 mg by mouth daily.   [name] Kitchen aspirin EC 81 MG tablet Take 81 mg by mouth daily.   . baclofen (LIORESAL) 10 MG tablet TAKE 2 TABLETS (20 MG TOTAL) BY MOUTH 3 (THREE) TIMES DAILY.   . brimonidine-timolol (COMBIGAN) 0.2-0.5 % ophthalmic solution Place 1 drop into the right eye every 12 hours.   . Cholecalciferol 25 MCG (1000 UT) capsule Take by mouth.   . Cinnamon 500 MG capsule Take by mouth.   . diphenhydrAMINE (BENADRYL) 25 MG tablet Take 25 mg by mouth 3 (three) times daily.   [name] Kitchen doxepin (SINEQUAN) 10 MG capsule TAKE 1 TO 2 CAPSULES AT BEDTIME   . etodolac (LODINE) 500 MG tablet Take 500 mg by mouth. 1-2 times a day   . Evening Primrose Oil 1000 MG CAPS    . famotidine (PEPCID) 20 MG tablet Take 1 tablet (20 mg total) by mouth 2 (two) times daily.   . Flaxseed, Linseed, (FLAX SEED OIL) 1000 MG CAPS Take by mouth.   . gabapentin (NEURONTIN) 300 MG capsule Take 1 capsule (300 mg total) by mouth 3 (three) times daily. 03/08/2018: Rx placed up front for patient pick up 03/08/18 ELK,RN  . Grape Seed 100 MG CAPS Take by mouth.   [name] Kitchen HYDROcodone-acetaminophen  (NORCO/VICODIN) 5-325 MG tablet Take 1 tablet by mouth 3 (three) times daily as needed for moderate pain.   [name] Kitchen ipratropium (ATROVENT) 0.06 % nasal spray Place 2 sprays into both nostrils 2 (two) times daily.   [name] Kitchen L-Lysine 1000 MG TABS Take by mouth.   . Lactobacillus (PROBIOTIC ACIDOPHILUS PO) Take by mouth.   . levocetirizine (XYZAL) 5 MG tablet TAKE 1 TABLET BY MOUTH EVERY DAY IN THE EVENING   . loratadine-pseudoephedrine (QC LORATADINE-D) 10-240 MG 24 hr tablet Take 1 tablet by mouth daily.   [name] Kitchen LORazepam (ATIVAN) 1 MG tablet Take 1 tablet (1 mg total) by mouth at bedtime.   . Melatonin 10 MG TABS Take 1 tablet by mouth at bedtime.    . Multiple Vitamins-Minerals (HAIR/SKIN/NAILS/BIOTIN) TABS Take by mouth.   . predniSONE (DELTASONE) 20 MG tablet Take 6 pills x 1d, then 5 pills po x 1d, then 4 pills po x 1d, then 3 pills po x 1d, then 2 pills po x 1d, then 1 pill po x 1 d, then 1/2 pill po x 1d   . rizatriptan (MAXALT-MLT) 10 MG disintegrating tablet Take 1 tablet (10 mg total) by mouth as needed for migraine. May repeat in 2 hours if needed.  Max of 2 tablets in 24 hours, and 10 tablets per month   . sertraline (ZOLOFT) 50 MG tablet TAKE 1 TABLET BY MOUTH EVERY DAY (Patient taking differently: 1/2 pill daily)   .  tiZANidine (ZANAFLEX) 2 MG tablet Take up to 4 pills a day   . VITAMIN D PO Take 1,000 Units by mouth daily.    . Vitamin D, Ergocalciferol, (DRISDOL) 1.25 MG (50000 UNIT) CAPS capsule ONE CAPSULE TWICE WEEKLY ON TUESDAYS/FRIDAYS   . vitamin E 400 UNIT capsule Take 400 Units by mouth daily.   [name] Kitchen zolpidem (AMBIEN) 10 MG tablet Take 10 mg by mouth at bedtime as needed. For sleep    No facility-administered encounter medications on file as of 11/21/2019.     Objective:   Goals Addressed            This Visit's Progress   . Collaborate with RN Care Manager to perform approrpiate assessments to assist with care coordination needs       CARE PLAN ENTRY (see longitudinal plan of care  for additional care plan information)  Current Barriers:  . Reported mold in home seen on walls, ceiling, and some furniture . Financial constraints related to cost of mold remediation . Caregiver burnout related to ongoing care needs of patient spouse who suffered a stroke approximately 6 years ago causing left sided paralysis  . Lacks knowledge of community resources to assist with ramp Architect . Chronic conditions including MS, depression, and class 1 obesity which impact patient self health management  Social Work Clinical Goal(s):  [name] Kitchen Over the next 120 days the patient will work with SW to identify resources to assist with home repair and modification needs  CCM SW Interventions: Completed 11/21/19 . Inter-disciplinary care team collaboration (see longitudinal plan of care) . Collaboration with [name] to review patient resource needs related to wheelchair ramp for spouse as well as mold remediation o Determined [facility] does assist with ramps but is not able to assist with mold remediation . Collaboration with [name] to determine the patient is not eligible for services due to being under the age of 28 . Collaboration with Sharol Given with [name] to determine the patient is eligible to receive services based on location and housing repair and modification need o Referral successfully placed to [name] to outreach the patient to screen for program eligibility based on income requirements . Unsuccessful outbound call placed to the patient to communicate referral to [name]; voice message left requesting a return call . Mailed the patient information on [name] to assist with transportation of spouse to alleviate caregiver stress surrounding appointment management . Scheduled follow up call to the patient over the next two weeks . Collaboration with RN Care Manager to inform of  patient care coordination needs and plan  Patient Self Care Activities:  . Self administers medications as prescribed . Attends all scheduled provider appointments . Calls pharmacy for medication refills . Performs ADL's independently . Calls provider office for new concerns or questions  Initial goal documentation        Plan:  SW to outreach the patient over the next two weeks.  Daneen Schick, BSW, CDP Social Worker, Certified Dementia Practitioner [place] [facility] [place] [facility] [phone]  Total time spent performing care coordination and/or care management activities with the patient by phone or face to face = 12 minutes.

## 2019-11-22 ENCOUNTER — Other Ambulatory Visit: Payer: Self-pay

## 2019-11-22 ENCOUNTER — Ambulatory Visit (INDEPENDENT_AMBULATORY_CARE_PROVIDER_SITE_OTHER): Payer: Medicare Other | Admitting: Family Medicine

## 2019-11-22 ENCOUNTER — Encounter (INDEPENDENT_AMBULATORY_CARE_PROVIDER_SITE_OTHER): Payer: Self-pay | Admitting: Family Medicine

## 2019-11-22 VITALS — BP 123/79 | HR 77 | Temp 97.9°F | Ht 67.0 in | Wt 221.0 lb

## 2019-11-22 DIAGNOSIS — E7849 Other hyperlipidemia: Secondary | ICD-10-CM | POA: Diagnosis not present

## 2019-11-22 DIAGNOSIS — F32A Depression, unspecified: Secondary | ICD-10-CM

## 2019-11-22 DIAGNOSIS — F419 Anxiety disorder, unspecified: Secondary | ICD-10-CM | POA: Diagnosis not present

## 2019-11-22 DIAGNOSIS — Z9189 Other specified personal risk factors, not elsewhere classified: Secondary | ICD-10-CM

## 2019-11-22 DIAGNOSIS — I10 Essential (primary) hypertension: Secondary | ICD-10-CM

## 2019-11-22 DIAGNOSIS — Z6835 Body mass index (BMI) 35.0-35.9, adult: Secondary | ICD-10-CM

## 2019-11-22 DIAGNOSIS — F329 Major depressive disorder, single episode, unspecified: Secondary | ICD-10-CM

## 2019-11-22 DIAGNOSIS — G35 Multiple sclerosis: Secondary | ICD-10-CM

## 2019-11-22 DIAGNOSIS — E559 Vitamin D deficiency, unspecified: Secondary | ICD-10-CM

## 2019-11-22 DIAGNOSIS — R5383 Other fatigue: Secondary | ICD-10-CM | POA: Diagnosis not present

## 2019-11-22 DIAGNOSIS — R7303 Prediabetes: Secondary | ICD-10-CM

## 2019-11-22 DIAGNOSIS — Z0289 Encounter for other administrative examinations: Secondary | ICD-10-CM

## 2019-11-22 DIAGNOSIS — R0602 Shortness of breath: Secondary | ICD-10-CM | POA: Diagnosis not present

## 2019-11-22 DIAGNOSIS — E538 Deficiency of other specified B group vitamins: Secondary | ICD-10-CM

## 2019-11-22 NOTE — Progress Notes (Signed)
Dear Dr. Baird Cancer,   Thank you for referring Grace [name] to our clinic. The following note includes my evaluation and treatment recommendations.  Chief Complaint:   OBESITY Grace [name] (MR# [id number]) is a 59 y.o. female who presents for evaluation and treatment of obesity and related comorbidities. Current BMI is Body mass index is 34.61 kg/m. Grace [name] has been struggling with her weight for many years and has been unsuccessful in either losing weight, maintaining weight loss, or reaching her healthy weight goal.  Grace [name] is currently in the action stage of change and ready to dedicate time achieving and maintaining a healthier weight. Grace [name] is interested in becoming our patient and working on intensive lifestyle modifications including (but not limited to) diet and exercise for weight loss.  Grace [name] lives with her husband, Grace [name], 70.  She is disable and does not work, but is a [place] and has caretaker stress.  She snacks on trail mix, peanut butter, pretzels, sweets such as ice cream, and cheese.  She does not eat tuna, pork, or beef.  She drinks a lot of caloric beverages such as coffee with sugar and flavored creamers, sweet tea, fruit smoothies, but also drinks protein shakes from time to time.  She cooks and her husband will eat whatever she makes.  She is very inactive.  She has MS and does very little movement.  She skips breakfast and lunch most days.  Grace [name]'s habits were reviewed today and are as follows: Her family eats meals together, she thinks her family will eat healthier with her, she struggles with family and or coworkers weight loss sabotage, her desired weight loss is 102 pounds, she has been heavy most of her life, she started gaining weight after her second pregnancy and after MS diagnosis, her heaviest weight ever was 230 pounds, she craves sweets and cheese, she snacks frequently in the evenings, she skips breakfast and lunch frequently, she is frequently  drinking liquids with calories, she frequently makes poor food choices, she has problems with excessive hunger, she frequently eats larger portions than normal and she struggles with emotional eating.  Depression Screen Grace [name]'s Food and Mood (modified PHQ-9) score was 20.  Depression screen PHQ 2/9 11/22/2019  Decreased Interest 3  Down, Depressed, Hopeless 2  PHQ - 2 Score 5  Altered sleeping 3  Tired, decreased energy 3  Change in appetite 2  Feeling bad or failure about yourself  2  Trouble concentrating 3  Moving slowly or fidgety/restless 2  Suicidal thoughts 0  PHQ-9 Score 20  Difficult doing work/chores Extremely dIfficult   Subjective:   1. Other fatigue Grace [name] admits to daytime somnolence and reports waking up still tired. Patent has a history of symptoms of daytime fatigue, morning fatigue, morning headache and snoring. Grace [name] generally gets 4 or 5 hours of sleep per night, and states that she has poor quality sleep. Snoring is present. Apneic episodes are present. Epworth Sleepiness Score is 11.  2. Shortness of breath on exertion [name] notes increasing shortness of breath with exercising and seems to be worsening over time with weight gain. She notes getting out of breath sooner with activity than she used to. This has gotten worse recently. Grace [name] denies shortness of breath at rest or orthopnea.  3. Prediabetes Grace [name] has a diagnosis of prediabetes based on her elevated HgA1c and was informed this puts her at greater risk of developing diabetes. She continues to work on diet and exercise to decrease her risk of  diabetes. She denies nausea or hypoglycemia.  Lab Results  Component Value Date   INSULIN 29.8 (H) 10/10/2019   4. Other hyperlipidemia Grace [name] has hyperlipidemia and has been trying to improve her cholesterol levels with intensive lifestyle modification including a low saturated fat diet, exercise and weight loss. She denies any chest pain, claudication or  myalgias.  Lab Results  Component Value Date   ALT 31 10/10/2019   AST 32 10/10/2019   ALKPHOS 104 10/10/2019   BILITOT 0.6 10/10/2019   Lab Results  Component Value Date   CHOL 205 (H) 11/22/2019   HDL 108 11/22/2019   LDLCALC 71 11/22/2019   TRIG 161 (H) 11/22/2019   CHOLHDL 1.9 11/22/2019   5. Essential hypertension Review: taking medications as instructed, no medication side effects noted, no chest pain on exertion, no dyspnea on exertion, no swelling of ankles.   BP Readings from Last 3 Encounters:  11/22/19 123/79  11/15/19 129/82  11/07/19 120/84   6. Vitamin D deficiency Grace [name]'s Vitamin D level was 35.5 on 10/10/2019. She is currently taking prescription vitamin D 50,000 IU twice weekly and 1000 IU OTC daily. She denies nausea, vomiting or muscle weakness.  7. B12 deficiency She notes fatigue. She is not a vegetarian.  She does not have a previous diagnosis of pernicious anemia.  She does not have a history of weight loss surgery.  Her PCP recently started her on a B12 treatment plan of injections.  Lab Results  Component Value Date   VITAMINB12 249 10/10/2019   8. MS (multiple sclerosis) (HCC) Grace [name] is treated for MS by Neurology and other specialists.  9. Anxiety and depression, with emotional eating Grace [name] has a counselor that she sees once a month.  Grace [name] is struggling with emotional eating and using food for comfort to the extent that it is negatively impacting her health. She has been working on behavior modification techniques to help reduce her emotional eating and has been unsuccessful. She shows no sign of suicidal or homicidal ideations.  10. At risk for diabetes mellitus [name] is at higher than average risk for developing diabetes due to her obesity.   Assessment/Plan:   1. Other fatigue Grace [name] does feel that her weight is causing her energy to be lower than it should be. Fatigue may be related to obesity, depression or many other causes. Labs will be  ordered, and in the meanwhile, Kiannah will focus on self care including making healthy food choices, increasing physical activity and focusing on stress reduction. - EKG 12-Lead - CBC with Differential/Platelet - Folate - T3, free - T4, free - TSH  2. Shortness of breath on exertion Grace [name] does feel that she gets out of breath more easily that she used to when she exercises. Grace [name]'s shortness of breath appears to be obesity related and exercise induced. She has agreed to work on weight loss and gradually increase exercise to treat her exercise induced shortness of breath. Will continue to monitor closely.  3. Prediabetes Grace [name] will continue to work on weight loss, exercise, and decreasing simple carbohydrates to help decrease the risk of diabetes.  - Hemoglobin A1c  4. Other hyperlipidemia Cardiovascular risk and specific lipid/LDL goals reviewed.  We discussed several lifestyle modifications today and Grace [name] will continue to work on diet, exercise and weight loss efforts. Orders and follow up as documented in patient record.   Counseling Intensive lifestyle modifications are the first line treatment for this issue. . Dietary changes: Increase soluble fiber. Decrease simple carbohydrates. [name] Kitchen  Exercise changes: Moderate to vigorous-intensity aerobic activity 150 minutes per week if tolerated. . Lipid-lowering medications: see documented in medical record. - Lipid panel  5. Essential hypertension Grace [name] is working on healthy weight loss and exercise to improve blood pressure control. We will watch for signs of hypotension as she continues her lifestyle modifications.  6. Vitamin D deficiency Low Vitamin D level contributes to fatigue and are associated with obesity, breast, and colon cancer. She agrees to continue to take prescription Vitamin D @50 ,000 IU every week and 1000 IU OTC daily will follow-up for routine testing of Vitamin D, at least 2-3 times per year to avoid over-replacement.  7.  B12 deficiency Treatment plan per PCP.  Counseling . The body needs vitamin B12: to make red blood cells; to make DNA; and to help the nerves work properly so they can carry messages from the brain to the body.  . The main causes of vitamin B12 deficiency include dietary deficiency, digestive diseases, pernicious anemia, and having a surgery in which part of the stomach or small intestine is removed.  . Certain medicines can make it harder for the body to absorb vitamin B12. These medicines include: heartburn medications; some antibiotics; some medications used to treat diabetes, gout, and high cholesterol.  . In some cases, there are no symptoms of this condition. If the condition leads to anemia or nerve damage, various symptoms can occur, such as weakness or fatigue, shortness of breath, and numbness or tingling in your hands and feet.   . Treatment:  o May include taking vitamin B12 supplements.  o Avoid alcohol.  o Eat lots of healthy foods that contain vitamin B12: - Beef, pork, chicken, [place], and organ meats, such as liver.  - Seafood: This includes clams, rainbow trout, salmon, tuna, and haddock. Eggs.  - Cereal and dairy products that are fortified: This means that vitamin B12 has been added to the food.   8. MS (multiple sclerosis) (Grace [name]) Followed by Neurology for this problem. Those encounter notes were reviewed. Orders and follow up as documented in patient record.  9. Anxiety and depression, with emotional eating Behavior modification techniques were discussed today to help Grace [name] deal with her emotional/non-hunger eating behaviors.  Orders and follow up as documented in patient record.   10. At risk for diabetes mellitus Grace [name] was given approximately 15 minutes of diabetes education and counseling today. We discussed intensive lifestyle modifications today with an emphasis on weight loss as well as increasing exercise and decreasing simple carbohydrates in her diet. We also  reviewed medication options with an emphasis on risk versus benefit of those discussed.   Repetitive spaced learning was employed today to elicit superior memory formation and behavioral change.  11. Class 2 severe obesity with serious comorbidity and body mass index (BMI) of 35.0 to 35.9 in adult, unspecified obesity type [facility]) Allesha is currently in the action stage of change and her goal is to continue with weight loss efforts. I recommend Rosene begin the structured treatment plan as follows:  She has agreed to the Category 1 Plan.  Exercise goals: As is.   Behavioral modification strategies: increasing lean protein intake, decreasing simple carbohydrates, increasing water intake, no skipping meals and planning for success.  She was informed of the importance of frequent follow-up visits to maximize her success with intensive lifestyle modifications for her multiple health conditions. She was informed we would discuss her lab results at her next visit unless there is a critical issue that needs  to be addressed sooner. Jenniferann agreed to keep her next visit at the agreed upon time to discuss these results.  Objective:   Blood pressure 123/79, pulse 77, temperature 97.9 F (36.6 C), temperature source Oral, height 5\' 7"  (1.702 m), weight 221 lb (100.2 kg), last menstrual period 04/09/2013, SpO2 95 %. Body mass index is 34.61 kg/m.  EKG: Normal sinus rhythm, rate 76 bpm.  Indirect Calorimeter completed today shows a VO2 of 150 and a REE of 1045.  Her calculated basal metabolic rate is 1103 thus her basal metabolic rate is worse than expected.  General: Cooperative, alert, well developed, in no acute distress. HEENT: Conjunctivae and lids unremarkable. Cardiovascular: Regular rhythm.  Lungs: Normal work of breathing. Neurologic: No focal deficits.   Lab Results  Component Value Date   CREATININE 0.66 10/10/2019   BUN 12 10/10/2019   NA 143 10/10/2019   K 4.3 10/10/2019   CL 106  10/10/2019   CO2 21 10/10/2019   Lab Results  Component Value Date   ALT 31 10/10/2019   AST 32 10/10/2019   ALKPHOS 104 10/10/2019   BILITOT 0.6 10/10/2019   Lab Results  Component Value Date   INSULIN 29.8 (H) 10/10/2019   Lab Results  Component Value Date   TSH 1.500 11/22/2019   Lab Results  Component Value Date   CHOL 205 (H) 11/22/2019   HDL 108 11/22/2019   LDLCALC 71 11/22/2019   TRIG 161 (H) 11/22/2019   CHOLHDL 1.9 11/22/2019   Lab Results  Component Value Date   WBC 10.3 11/22/2019   HGB 13.7 11/22/2019   HCT 40.3 11/22/2019   MCV 93 11/22/2019   PLT 275 11/22/2019   Attestation Statements:   Reviewed by clinician on day of visit: allergies, medications, problem list, medical history, surgical history, family history, social history, and previous encounter notes.  I, Water quality scientist, CMA, am acting as Location manager for [employer], DO.  I have reviewed the above documentation for accuracy and completeness, and I agree with the above. Mellody Dance, DO

## 2019-11-23 LAB — CBC WITH DIFFERENTIAL/PLATELET
Basophils Absolute: 0 10*3/uL (ref 0.0–0.2)
Basos: 0 %
EOS (ABSOLUTE): 0.3 10*3/uL (ref 0.0–0.4)
Eos: 3 %
Hematocrit: 40.3 % (ref 34.0–46.6)
Hemoglobin: 13.7 g/dL (ref 11.1–15.9)
Immature Grans (Abs): 0.1 10*3/uL (ref 0.0–0.1)
Immature Granulocytes: 1 %
Lymphocytes Absolute: 3.7 10*3/uL — ABNORMAL HIGH (ref 0.7–3.1)
Lymphs: 36 %
MCH: 31.6 pg (ref 26.6–33.0)
MCHC: 34 g/dL (ref 31.5–35.7)
MCV: 93 fL (ref 79–97)
Monocytes Absolute: 0.7 10*3/uL (ref 0.1–0.9)
Monocytes: 7 %
Neutrophils Absolute: 5.5 10*3/uL (ref 1.4–7.0)
Neutrophils: 53 %
Platelets: 275 10*3/uL (ref 150–450)
RBC: 4.33 x10E6/uL (ref 3.77–5.28)
RDW: 12.6 % (ref 11.7–15.4)
WBC: 10.3 10*3/uL (ref 3.4–10.8)

## 2019-11-23 LAB — T4, FREE: Free T4: 1.46 ng/dL (ref 0.82–1.77)

## 2019-11-23 LAB — T3, FREE: T3, Free: 3.1 pg/mL (ref 2.0–4.4)

## 2019-11-23 LAB — TSH: TSH: 1.5 u[IU]/mL (ref 0.450–4.500)

## 2019-11-23 LAB — HEMOGLOBIN A1C
Est. average glucose Bld gHb Est-mCnc: 123 mg/dL
Hgb A1c MFr Bld: 5.9 % — ABNORMAL HIGH (ref 4.8–5.6)

## 2019-11-23 LAB — LIPID PANEL
Chol/HDL Ratio: 1.9 ratio (ref 0.0–4.4)
Cholesterol, Total: 205 mg/dL — ABNORMAL HIGH (ref 100–199)
HDL: 108 mg/dL (ref >39)
LDL Chol Calc (NIH): 71 mg/dL (ref 0–99)
Triglycerides: 161 mg/dL — ABNORMAL HIGH (ref 0–149)
VLDL Cholesterol Cal: 26 mg/dL (ref 5–40)

## 2019-11-23 LAB — FOLATE: Folate: 10.7 ng/mL (ref >3.0)

## 2019-11-26 ENCOUNTER — Ambulatory Visit (INDEPENDENT_AMBULATORY_CARE_PROVIDER_SITE_OTHER): Payer: Medicare Other | Admitting: Psychology

## 2019-11-26 DIAGNOSIS — F064 Anxiety disorder due to known physiological condition: Secondary | ICD-10-CM | POA: Diagnosis not present

## 2019-11-27 ENCOUNTER — Ambulatory Visit: Payer: Medicare Other | Admitting: Psychology

## 2019-12-02 ENCOUNTER — Encounter (INDEPENDENT_AMBULATORY_CARE_PROVIDER_SITE_OTHER): Payer: Self-pay | Admitting: Family Medicine

## 2019-12-04 ENCOUNTER — Telehealth: Payer: Self-pay

## 2019-12-04 ENCOUNTER — Telehealth: Payer: Medicare Other

## 2019-12-04 NOTE — Chronic Care Management (AMB) (Signed)
  Chronic Care Management   Note  12/04/2019 Name: Terrence Pizana MRN: [id number] DOB: [birth year]  Nabeeha Badertscher Freeze is a 59 y.o. year old female who is a primary care patient of [name], MD and is actively engaged with the care management team. I reached out to [place] by phone today to assist with scheduling an initial visit with the Pharmacist.  Follow up plan: Telephone appointment with care management team member scheduled for: 12/18/2019  [place], [place], [place] [address] Direct Dial: [email or web address] Website: [email or web address]

## 2019-12-04 NOTE — Telephone Encounter (Signed)
  Chronic Care Management   Outreach Note  12/04/2019 Name: Grace [name] MRN: [id number] DOB: 10-24-1960  Referred by: [name], MD Reason for referral : Care Coordination (CCM SW Follow up)   A second unsuccessful telephone outreach was attempted today to assist with patient resource needs. SW unable to leave HIPAA compliant voice message requesting a return call due to the patient voice mailbox being full.  Follow Up Plan: The care management team will reach out to the patient again over the next 14 days.   Daneen Schick, BSW, CDP Social Worker, Certified Dementia Practitioner [place] [facility] [place] [facility] [phone]

## 2019-12-06 ENCOUNTER — Ambulatory Visit (INDEPENDENT_AMBULATORY_CARE_PROVIDER_SITE_OTHER): Payer: Medicare Other | Admitting: Family Medicine

## 2019-12-11 ENCOUNTER — Other Ambulatory Visit: Payer: Self-pay

## 2019-12-11 ENCOUNTER — Ambulatory Visit (INDEPENDENT_AMBULATORY_CARE_PROVIDER_SITE_OTHER): Payer: Medicare Other

## 2019-12-11 ENCOUNTER — Ambulatory Visit: Payer: Medicare Other | Admitting: Psychology

## 2019-12-11 VITALS — BP 128/78 | HR 76 | Temp 97.9°F | Ht 66.4 in | Wt 223.6 lb

## 2019-12-11 DIAGNOSIS — E538 Deficiency of other specified B group vitamins: Secondary | ICD-10-CM | POA: Diagnosis not present

## 2019-12-11 MED ORDER — CYANOCOBALAMIN 1000 MCG/ML IJ SOLN
1000.0000 ug | Freq: Once | INTRAMUSCULAR | Status: AC
  Administered 2019-12-11: 1000 ug via INTRAMUSCULAR

## 2019-12-11 NOTE — Progress Notes (Signed)
Pt is here for b12 injection. This will be her 5th inject. This will be her first monthly injection.

## 2019-12-12 ENCOUNTER — Telehealth: Payer: Medicare Other

## 2019-12-12 ENCOUNTER — Ambulatory Visit: Payer: Self-pay

## 2019-12-12 ENCOUNTER — Telehealth: Payer: Self-pay | Admitting: *Deleted

## 2019-12-12 DIAGNOSIS — G35 Multiple sclerosis: Secondary | ICD-10-CM

## 2019-12-12 DIAGNOSIS — E538 Deficiency of other specified B group vitamins: Secondary | ICD-10-CM

## 2019-12-12 NOTE — Chronic Care Management (AMB) (Signed)
Chronic Care Management    Social Work Follow Up Note  12/12/2019 Name: Grace [name] MRN: [id number] DOB: [birth year]  Grace [name] is a 59 y.o. year old female who is a primary care patient of [name], MD. The CCM team was consulted for assistance with care coordination.   Review of patient status, including review of consultants reports, other relevant assessments, and collaboration with appropriate care team members and the patient's provider was performed as part of comprehensive patient evaluation and provision of chronic care management services.    SW placed a third unsuccessful outbound call to the patient to follow up on care coordination needs. SW left a HIPAA compliant voice message requesting a return call.    Outpatient Encounter Medications as of 12/12/2019  Medication Sig Note   Ascorbic Acid (VITAMIN C) 1000 MG tablet Take 1,000 mg by mouth daily.    aspirin EC 81 MG tablet Take 81 mg by mouth daily.    baclofen (LIORESAL) 10 MG tablet TAKE 2 TABLETS (20 MG TOTAL) BY MOUTH 3 (THREE) TIMES DAILY.    brimonidine-timolol (COMBIGAN) 0.2-0.5 % ophthalmic solution Place 1 drop into the right eye every 12 hours.    Cholecalciferol 25 MCG (1000 UT) capsule Take by mouth.    Cinnamon 500 MG capsule Take by mouth.    diphenhydrAMINE (BENADRYL) 25 MG tablet Take 25 mg by mouth 3 (three) times daily.    doxepin (SINEQUAN) 10 MG capsule TAKE 1 TO 2 CAPSULES AT BEDTIME    etodolac (LODINE) 500 MG tablet Take 500 mg by mouth. 1-2 times a day    Evening Primrose Oil 1000 MG CAPS     famotidine (PEPCID) 20 MG tablet Take 1 tablet (20 mg total) by mouth 2 (two) times daily.    Flaxseed, Linseed, (FLAX SEED OIL) 1000 MG CAPS Take by mouth.    gabapentin (NEURONTIN) 300 MG capsule Take 1 capsule (300 mg total) by mouth 3 (three) times daily. 03/08/2018: Rx placed up front for patient pick up 03/08/18 ELK,RN   Grape Seed 100 MG CAPS Take by mouth.     HYDROcodone-acetaminophen (NORCO/VICODIN) 5-325 MG tablet Take 1 tablet by mouth 3 (three) times daily as needed for moderate pain.    ipratropium (ATROVENT) 0.06 % nasal spray Place 2 sprays into both nostrils 2 (two) times daily.    L-Lysine 1000 MG TABS Take by mouth.    Lactobacillus (PROBIOTIC ACIDOPHILUS PO) Take by mouth.    levocetirizine (XYZAL) 5 MG tablet TAKE 1 TABLET BY MOUTH EVERY DAY IN THE EVENING    loratadine-pseudoephedrine (QC LORATADINE-D) 10-240 MG 24 hr tablet Take 1 tablet by mouth daily.    LORazepam (ATIVAN) 1 MG tablet Take 1 tablet (1 mg total) by mouth at bedtime.    Melatonin 10 MG TABS Take 1 tablet by mouth at bedtime.     Multiple Vitamins-Minerals (HAIR/SKIN/NAILS/BIOTIN) TABS Take by mouth.    predniSONE (DELTASONE) 20 MG tablet Take 6 pills x 1d, then 5 pills po x 1d, then 4 pills po x 1d, then 3 pills po x 1d, then 2 pills po x 1d, then 1 pill po x 1 d, then 1/2 pill po x 1d    rizatriptan (MAXALT-MLT) 10 MG disintegrating tablet Take 1 tablet (10 mg total) by mouth as needed for migraine. May repeat in 2 hours if needed.  Max of 2 tablets in 24 hours, and 10 tablets per month    sertraline (ZOLOFT) 50 MG tablet TAKE  1 TABLET BY MOUTH EVERY DAY (Patient taking differently: 1/2 pill daily)    tiZANidine (ZANAFLEX) 2 MG tablet Take up to 4 pills a day    VITAMIN D PO Take 1,000 Units by mouth daily.     vitamin E 400 UNIT capsule Take 400 Units by mouth daily.    zolpidem (AMBIEN) 10 MG tablet Take 10 mg by mouth at bedtime as needed. For sleep    No facility-administered encounter medications on file as of 12/12/2019.     Goals Addressed            This Visit's Progress    COMPLETED: Collaborate with RN Care Manager to perform approrpiate assessments to assist with care coordination needs       CARE PLAN ENTRY (see longitudinal plan of care for additional care plan information)  Current Barriers:   Reported mold in home seen on walls,  ceiling, and some furniture  Financial constraints related to cost of mold remediation  Caregiver burnout related to ongoing care needs of patient spouse who suffered a stroke approximately 6 years ago causing left sided paralysis   Lacks knowledge of community resources to assist with ramp construction  Chronic conditions including MS, depression, and class 1 obesity which impact patient self health management  Social Work Clinical Goal(s):   Over the next 120 days the patient will work with SW to identify resources to assist with home repair and modification needs  12/12/19- Goal closed due to inability to maintain patient contact  CCM SW Interventions: Completed 11/21/19  Inter-disciplinary care team collaboration (see longitudinal plan of care)  Collaboration with [employer] ([place]) to review patient resource needs related to wheelchair ramp for spouse as well as mold remediation o Determined [facility] does assist with ramps but is not able to assist with mold remediation  Collaboration with [employer] ([place]) to determine the patient is not eligible for services due to being under the age of 34  Collaboration with Sharol Given with [facility] [employer]) to determine the patient is eligible to receive services based on location and housing repair and modification need o Referral successfully placed to [name] to outreach the patient to screen for program eligibility based on income requirements  Unsuccessful outbound call placed to the patient to communicate referral to [facility]; voice message left requesting a return call  Mailed the patient information on [facility] to assist with transportation of spouse to alleviate caregiver stress surrounding appointment management  Scheduled follow up call to the patient over the next two weeks  Collaboration with RN Care Manager to inform of patient care coordination needs  and plan  Patient Self Care Activities:   Self administers medications as prescribed  Attends all scheduled provider appointments  Calls pharmacy for medication refills  Performs ADL's independently  Calls provider office for new concerns or questions  Initial goal documentation         Follow Up Plan: No further SW outreach planned at this time. Initial PharmD outreach scheduled for 12/18/19. Initial RN Care Manager outreach scheduled for 12/26/19. SW is available to assist with patient care coordination needs should a return call be received.   Daneen Schick, BSW, CDP Social Worker, Certified Dementia Practitioner [place] [facility] [place] [facility] [phone]

## 2019-12-12 NOTE — Chronic Care Management (AMB) (Signed)
  Chronic Care Management   Note  12/12/2019 Name: Grace [name] MRN: [id number] DOB: 1961/02/13  Grace [name] is a 59 y.o. year old female who is a primary care patient of [name], MD and is actively engaged with the care management team. I reached out to Grace [name] by phone today to assist with re-scheduling an initial visit with the Pharmacist.  Follow up plan: Unsuccessful telephone outreach attempt made. A HIPPA compliant phone message was left for the patient providing contact information and requesting a return call.  The care management team will reach out to the patient again over the next 7 days.  If patient returns call to provider office, please advise to call [place]  at [phone].  [place], [place] [address] Direct Dial: [phone] [email or web address]: [email or web address]

## 2019-12-14 NOTE — Chronic Care Management (AMB) (Signed)
  Chronic Care Management   Note  12/14/2019 Name: Grace [name] MRN: [id number] DOB: Sep 13, 1960  Grace [name] is a 59 y.o. year old female who is a primary care patient of [name], MD and is actively engaged with the care management team. I reached out to Mat Carne Rahm by phone today to assist with re-scheduling an initial visit with the Pharmacist.  Follow up plan: A second unsuccessful telephone outreach attempt made. A HIPPA compliant phone message was left for the patient providing contact information and requesting a return call.  The care management team will reach out to the patient again over the next 7 days.  If patient returns call to provider office, please advise to call [place] at [phone].  [place], [place] [address] Direct Dial: [phone] [email or web address]: [email or web address]

## 2019-12-17 ENCOUNTER — Ambulatory Visit (INDEPENDENT_AMBULATORY_CARE_PROVIDER_SITE_OTHER): Payer: Medicare Other | Admitting: Family Medicine

## 2019-12-17 ENCOUNTER — Other Ambulatory Visit: Payer: Self-pay

## 2019-12-17 ENCOUNTER — Encounter (INDEPENDENT_AMBULATORY_CARE_PROVIDER_SITE_OTHER): Payer: Self-pay | Admitting: Family Medicine

## 2019-12-17 VITALS — BP 120/80 | HR 80 | Temp 97.7°F | Ht 67.0 in | Wt 220.0 lb

## 2019-12-17 DIAGNOSIS — E559 Vitamin D deficiency, unspecified: Secondary | ICD-10-CM | POA: Diagnosis not present

## 2019-12-17 DIAGNOSIS — R7303 Prediabetes: Secondary | ICD-10-CM | POA: Diagnosis not present

## 2019-12-17 DIAGNOSIS — E7849 Other hyperlipidemia: Secondary | ICD-10-CM | POA: Diagnosis not present

## 2019-12-17 DIAGNOSIS — E669 Obesity, unspecified: Secondary | ICD-10-CM

## 2019-12-17 DIAGNOSIS — Z9189 Other specified personal risk factors, not elsewhere classified: Secondary | ICD-10-CM

## 2019-12-17 DIAGNOSIS — Z6834 Body mass index (BMI) 34.0-34.9, adult: Secondary | ICD-10-CM

## 2019-12-17 DIAGNOSIS — E538 Deficiency of other specified B group vitamins: Secondary | ICD-10-CM

## 2019-12-17 DIAGNOSIS — F418 Other specified anxiety disorders: Secondary | ICD-10-CM
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
## 2019-12-18 ENCOUNTER — Telehealth: Payer: Medicare Other

## 2019-12-18 NOTE — Progress Notes (Signed)
Chief Complaint:   OBESITY Grace [name] is here to discuss her progress with her obesity treatment plan along with follow-up of her obesity related diagnoses. Grace [name] is on the Category 1 Plan and states she is following her eating plan approximately 65% of the time. Grace [name] states she is exercising for 0 minutes 0 times per week.  Today's visit was #: 2 Starting weight: 221 lbs Starting date: 11/22/2019 Today's weight: 220 lbs Today's date: 12/17/2019 Total lbs lost to date: 1 lb Total lbs lost since last in-office visit: 1 lb  Interim History: Grace [name] says she had a funeral she had to go to (her 26 year old niece passed from leukemia).  Also, her daughter is in town, so she has been eating out and not on plan.  When eating on the plan, for 2 days she followed it exactly, she craved sweets.  She says she did not measure proteins/meats.  She went from eating once per day and now 2-3 times per day.  She drinks 4-5 bottles of water per day.  She usually has 1-2 chocolate milkshakes per week.  She is now down to one weekly.  When she eats out, she is getting sides of potatoes instead of vegetables.  Subjective:   1. Prediabetes Grace [name] has a diagnosis of prediabetes based on her elevated HgA1c and was informed this puts her at greater risk of developing diabetes. She continues to work on diet and exercise to decrease her risk of diabetes. She denies nausea or hypoglycemia.  She previously had labs at her PCPs office.  She had prediabetes years and years ago.    Lab Results  Component Value Date   HGBA1C 5.9 (H) 11/22/2019   Lab Results  Component Value Date   INSULIN 29.8 (H) 10/10/2019   2. Vitamin D deficiency Grace [name]'s Vitamin D level was 35.5 on 10/10/2019. She is currently taking prescription vitamin D 50,000 IU twice a week and OTC vitamin D 1000 IU daily. She denies nausea, vomiting or muscle weakness.  Positive history of vitamin D deficiency.    3. Other hyperlipidemia with  hypertriglyceridemia Grace [name] has hyperlipidemia and hypertriglyceridemia and has been trying to improve her cholesterol levels with intensive lifestyle modification including a low saturated fat diet, exercise and weight loss. She denies any chest pain, claudication or myalgias.    Lab Results  Component Value Date   ALT 31 10/10/2019   AST 32 10/10/2019   ALKPHOS 104 10/10/2019   BILITOT 0.6 10/10/2019   Lab Results  Component Value Date   CHOL 205 (H) 11/22/2019   HDL 108 11/22/2019   LDLCALC 71 11/22/2019   TRIG 161 (H) 11/22/2019   CHOLHDL 1.9 11/22/2019   4. Vitamin B 12 deficiency She notes fatigue. She is not a vegetarian.  She does not have a previous diagnosis of pernicious anemia.  She does not have a history of weight loss surgery.  She was started on B12 injections with her PCP.  She has been on injections for 30 days now.  She started treatment 1-2 months ago.  Lab Results  Component Value Date   VITAMINB12 249 10/10/2019   5. Depression with anxiety, with emotional eating Grace [name] is currently seeing a therapist once a month.  We recommend she see our specialist her to help with her emotional eating.  6. At risk for diabetes mellitus Grace [name] is at higher than average risk for developing diabetes due to her obesity.   Assessment/Plan:   1. Prediabetes Discussed labs with  patient today.  Grace [name] will continue to work on weight loss, exercise, and decreasing simple carbohydrates to help decrease the risk of diabetes.    2. Vitamin D deficiency Discussed labs with patient today.  Low Vitamin D level contributes to fatigue and are associated with obesity, breast, and colon cancer. She agrees to continue to take prescription Vitamin D @50 ,000 IU every week and will follow-up for routine testing of Vitamin D, at least 2-3 times per year to avoid over-replacement.  Continue medications as written.  3. Other hyperlipidemia with hypertriglyceridemia Discussed labs with patient  today.  Cardiovascular risk and specific lipid/LDL goals reviewed.  We discussed several lifestyle modifications today and Grace [name] will continue to work on diet, exercise and weight loss efforts. Orders and follow up as documented in patient record.   Counseling Intensive lifestyle modifications are the first line treatment for this issue. . Dietary changes: Increase soluble fiber. Decrease simple carbohydrates. . Exercise changes: Moderate to vigorous-intensity aerobic activity 150 minutes per week if tolerated. . Lipid-lowering medications: see documented in medical record.  4. Vitamin B 12 deficiency Discussed labs with patient today.  The diagnosis was reviewed with the patient. Counseling provided today, see below. We will continue to monitor. Orders and follow up as documented in patient record.  Continue care per PCP.  Counseling . The body needs vitamin B12: to make red blood cells; to make DNA; and to help the nerves work properly so they can carry messages from the brain to the body.  . The main causes of vitamin B12 deficiency include dietary deficiency, digestive diseases, pernicious anemia, and having a surgery in which part of the stomach or small intestine is removed.  . Certain medicines can make it harder for the body to absorb vitamin B12. These medicines include: heartburn medications; some antibiotics; some medications used to treat diabetes, gout, and high cholesterol.  . In some cases, there are no symptoms of this condition. If the condition leads to anemia or nerve damage, various symptoms can occur, such as weakness or fatigue, shortness of breath, and numbness or tingling in your hands and feet.   . Treatment:  o May include taking vitamin B12 supplements.  o Avoid alcohol.  o Eat lots of healthy foods that contain vitamin B12: - Beef, pork, chicken, [place], and organ meats, such as liver.  - Seafood: This includes clams, rainbow trout, salmon, tuna, and haddock. Eggs.   - Cereal and dairy products that are fortified: This means that vitamin B12 has been added to the food.   5. Depression with anxiety, with emotional eating Patient was referred to Dr. Mallie Mussel, our Bariatric Psychologist, for evaluation due to her elevated PHQ-9 score and significant struggles with emotional eating.  6. At risk for diabetes mellitus Grace [name] was given approximately 15 minutes of diabetes education and counseling today. We discussed intensive lifestyle modifications today with an emphasis on weight loss as well as increasing exercise and decreasing simple carbohydrates in her diet. We also reviewed medication options with an emphasis on risk versus benefit of those discussed.   Repetitive spaced learning was employed today to elicit superior memory formation and behavioral change.  7. Class 1 obesity with serious comorbidity and body mass index (BMI) of 34.0 to 34.9 in adult, unspecified obesity type Grace [name] is currently in the action stage of change. As such, her goal is to continue with weight loss efforts. She has agreed to the Category 1 Plan.   Exercise goals: As is.  Behavioral  modification strategies: increasing lean protein intake, decreasing simple carbohydrates, decreasing liquid calories, decreasing eating out and avoiding temptations.  Grace [name] has agreed to follow-up with our clinic in 2 weeks. She was informed of the importance of frequent follow-up visits to maximize her success with intensive lifestyle modifications for her multiple health conditions.   Objective:   Blood pressure 120/80, pulse 80, temperature 97.7 F (36.5 C), height 5\' 7"  (1.702 m), weight 220 lb (99.8 kg), last menstrual period 04/09/2013, SpO2 95 %. Body mass index is 34.46 kg/m.  General: Cooperative, alert, well developed, in no acute distress. HEENT: Conjunctivae and lids unremarkable. Cardiovascular: Regular rhythm.  Lungs: Normal work of breathing. Neurologic: No focal deficits.   Lab  Results  Component Value Date   CREATININE 0.66 10/10/2019   BUN 12 10/10/2019   NA 143 10/10/2019   K 4.3 10/10/2019   CL 106 10/10/2019   CO2 21 10/10/2019   Lab Results  Component Value Date   ALT 31 10/10/2019   AST 32 10/10/2019   ALKPHOS 104 10/10/2019   BILITOT 0.6 10/10/2019   Lab Results  Component Value Date   HGBA1C 5.9 (H) 11/22/2019   Lab Results  Component Value Date   INSULIN 29.8 (H) 10/10/2019   Lab Results  Component Value Date   TSH 1.500 11/22/2019   Lab Results  Component Value Date   CHOL 205 (H) 11/22/2019   HDL 108 11/22/2019   LDLCALC 71 11/22/2019   TRIG 161 (H) 11/22/2019   CHOLHDL 1.9 11/22/2019   Lab Results  Component Value Date   WBC 10.3 11/22/2019   HGB 13.7 11/22/2019   HCT 40.3 11/22/2019   MCV 93 11/22/2019   PLT 275 11/22/2019   Attestation Statements:   Reviewed by clinician on day of visit: allergies, medications, problem list, medical history, surgical history, family history, social history, and previous encounter notes.  I, Water quality scientist, CMA, am acting as Location manager for [employer], DO.  I have reviewed the above documentation for accuracy and completeness, and I agree with the above. -  \Karell Tukes, DO

## 2019-12-20 ENCOUNTER — Telehealth (INDEPENDENT_AMBULATORY_CARE_PROVIDER_SITE_OTHER): Payer: Medicare Other | Admitting: Psychology

## 2019-12-20 DIAGNOSIS — F3289 Other specified depressive episodes: Secondary | ICD-10-CM | POA: Diagnosis not present

## 2019-12-20 NOTE — Progress Notes (Signed)
  Office: [phone]  /  Fax: [phone]    Date: January 03, 2020   Appointment Start Time: 3:54pm Duration: 31 minutes Provider: Glennie Isle, Psy.D. Type of Session: Individual Therapy  Location of Patient: Home Location of Provider: Provider's Home Type of Contact: Telepsychological Visit via MyChart Video Visit  Session Content: Grace [name] is a 59 y.o. female presenting for a follow-up appointment to address the previously established treatment goal of increasing coping skills. Today's appointment was a telepsychological visit due to COVID-19. Grace [name] provided verbal consent for today's telepsychological appointment and she is aware she is responsible for securing confidentiality on her end of the session. Prior to proceeding with today's appointment, Grace [name]'s physical location at the time of this appointment was obtained as well a phone number she could be reached at in the event of technical difficulties. Grace [name] and this provider participated in today's telepsychological service.   This provider conducted a brief check-in. Grace [name] reported, "I've been a little down." She explained her next door neighbor's son (age 88) is not doing well. Associated thoughts and feelings were briefly processed. She acknowledged the aforementioned "brings back" memories of when she was younger and lost her brother. She was encouraged to discussed this further with Grace Gianotti, LCSW at their next appointment on Tuesday; she agreed.   Grace [name] noted continued challenges with eating regularly. Consequences of not eating regularly were discussed. Grace [name] was receptive to eating smaller portions regularly. She was also encouraged to set alarms on her phone for reminders. Moreover, psychoeducation regarding SMART goals was provided and Grace [name] was engaged in goal setting. The following goal was established: Grace [name] will eat dinner congruent to her meal plan between 6-7pm 4 out of 7 days a week between now and the next appointment  with this provider. Zalea was receptive to today's appointment as evidenced by openness to sharing, responsiveness to feedback, and willingness to work toward the established SMART goal.   Mental Status Examination:  Appearance: well groomed and appropriate hygiene  Behavior: appropriate to circumstances Mood: sad Affect: mood congruent Speech: normal in rate, volume, and tone Eye Contact: appropriate Psychomotor Activity: appropriate Gait: unable to assess Thought Process: linear, logical, and goal directed  Thought Content/Perception: no hallucinations, delusions, bizarre thinking or behavior reported or observed and no evidence of suicidal and homicidal ideation, plan, and intent Orientation: time, person, place, and purpose of appointment Memory/Concentration: memory, attention, language, and fund of knowledge intact  Insight/Judgment: fair  Interventions:  Conducted a brief chart review Provided empathic reflections and validation Employed supportive psychotherapy interventions to facilitate reduced distress and to improve coping skills with identified stressors Engaged patient in problem solving Psychoeducation regarding SMART goals  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 Progress is limited, as Kayci has just begun treatment with this provider; however, she is receptive to the interaction and interventions and rapport is being established.   Plan: The next appointment will be scheduled in three weeks, which will be via MyChart Video Visit. The next session will focus on working towards the established treatment goal.

## 2019-12-21 NOTE — Chronic Care Management (AMB) (Signed)
  Care Management   Note  12/21/2019 Name: [name] MRN: [id number] DOB: 1960/07/04  Grace [name] is a 59 y.o. year old female who is a primary care patient of [name], MD and is actively engaged with the care management team. I reached out to Mat Carne Flater by phone today to assist with re-scheduling an initial visit with the Pharmacist  Follow up plan: Unsuccessful telephone outreach attempt made.The care management team will reach out to the patient again over the next 7 days. If patient returns call to provider office, please advise to call [place] at [phone].  [place], [place] [address] Direct Dial: [phone] [email or web address]: [email or web address]

## 2019-12-26 ENCOUNTER — Other Ambulatory Visit: Payer: Self-pay

## 2019-12-26 ENCOUNTER — Telehealth: Payer: Medicare Other

## 2019-12-26 ENCOUNTER — Ambulatory Visit (INDEPENDENT_AMBULATORY_CARE_PROVIDER_SITE_OTHER): Payer: Medicare Other

## 2019-12-26 DIAGNOSIS — Z6834 Body mass index (BMI) 34.0-34.9, adult: Secondary | ICD-10-CM

## 2019-12-26 DIAGNOSIS — F418 Other specified anxiety disorders: Secondary | ICD-10-CM | POA: Diagnosis not present

## 2019-12-26 DIAGNOSIS — E6609 Other obesity due to excess calories: Secondary | ICD-10-CM

## 2019-12-26 DIAGNOSIS — F3342 Major depressive disorder, recurrent, in full remission: Secondary | ICD-10-CM

## 2019-12-26 DIAGNOSIS — E559 Vitamin D deficiency, unspecified: Secondary | ICD-10-CM

## 2019-12-26 DIAGNOSIS — G35 Multiple sclerosis: Secondary | ICD-10-CM

## 2019-12-26 NOTE — Chronic Care Management (AMB) (Signed)
°  Chronic Care Management   Note  12/26/2019 Name: Grace [name] MRN: [id number] DOB: Jan 06, 1961  Grace [name] is a 59 y.o. year old female who is a primary care patient of [name], MD and is actively engaged with the care management team. I reached out to Grace [name] by phone today to assist with re-scheduling an initial visit with the Pharmacist.  Follow up plan: Telephone appointment with care management team member scheduled for: 12/31/2019  [place], Grace [name],  [phone] Direct Dial: [email or web address] Website: [email or web address]

## 2019-12-27 ENCOUNTER — Telehealth: Payer: Self-pay

## 2019-12-27 NOTE — Chronic Care Management (AMB) (Signed)
Chronic Care Management Pharmacy Assistant   Name: Grace [name]  MRN: [id number] DOB: [birth year]  Reason for Encounter: Medication Review / Initial Questions for Initial Pharmacist call on 12/31/19.  Patient Questions: Have you seen any other providers since your last visit? Yes- 11/15/19- Dr Felecia Shelling (Neurology): Prednisone changed from 10 mg to 20 mg  11/21/19- Daneen Schick (CCM Social Worker), 11/21/19- Barb Merino (CCM Nurse), 7/15;8/9;&8/12- Doretha Imus (MWM), 11/26/19- Myra Gianotti, LCSW [facility]), 11/28/19- Dr Susa Simmonds (Opthalmology) 12/26/19- [place] (CCM Nurse).  Any changes in your medications or health? No  Any side effects from any medications? Yes- Patient states she felt like she was having some daytime sleepiness and dizziness when she took Tizanidine, baclofen, hydrocodone and doxepin. She changed to taking these medications at night.   Do you have an symptoms or problems not managed by your medications? No  Any concerns about your health right now? Yes- Patient states she is having a hard time sleeping at night even after taking all of her medication that were making her sleepy, she will take her medications at 10 pm but will be up until 4am then fall asleep until 12 pm. Patient said she has a prescription for Ambien that she uses as needed but she is afraid of this medication and possible side effects. Patient mentioned a side effect one of her friends had of sleep walking and doing this without her knowledge.   Has your provider asked that you check blood pressure, blood sugar, or follow special diet at home? No- Patient used to but was told to stop by Dr Felecia Shelling, she states her blood pressures are usually normal. Patient mentioned that her blood pressures used to fluctuate when she was on a certain type of medication for her MS.   Do you get any type of exercise on a regular basis? 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  Can you think of a goal you would like to reach for your health? Weight loss, [facility].  Do you have any problems getting your medications? No- Patient uses [facility] and has not had any problems with getting her medications.   Is there anything that you would like to discuss during the appointment? No  Patient aware to please have medications and supplements near during her telephone appointment.   PCP : [name], MD  Allergies:   Allergies  Allergen Reactions  . Lamictal [Lamotrigine] Shortness Of Breath, Itching and Swelling    Tongue swells  . Mobic [Meloxicam] Anaphylaxis  . Ultram [Tramadol Hcl] Anaphylaxis  . Codeine Itching  . Diclofenac Itching  . Latex     Latex band-aids cause redness and tears your skin  . Nitrofurantoin     Causing itching all over, tongue swelling, chest heaviness, swollen lips, cough, rapid HR, swollen eyelids, red eyes/gums, lips red/white per pt.  . Teriflunomide Diarrhea    Hair loss, joint pains, elevated liver enzymes abugio (joint pains and liver issues)  . Trazodone And Nefazodone     Mouth sores, tongue swelling  . Tape Rash    Medications: Outpatient Encounter Medications as of 12/27/2019  Medication Sig Note  . Ascorbic Acid (VITAMIN C) 1000 MG tablet Take 1,000 mg by mouth daily.   [name] Kitchen  aspirin EC 81 MG tablet Take 81 mg by mouth daily.   . baclofen (LIORESAL) 10 MG tablet TAKE 2 TABLETS (20 MG TOTAL) BY MOUTH 3 (THREE) TIMES DAILY.   . brimonidine-timolol (COMBIGAN) 0.2-0.5 % ophthalmic solution Place 1 drop into the right eye every 12 hours.   . Cholecalciferol 25 MCG (1000 UT) capsule Take by mouth.   . Cinnamon 500 MG capsule Take by mouth.   . diphenhydrAMINE (BENADRYL) 25 MG tablet Take 25 mg by mouth 3 (three) times daily.   [name] Kitchen doxepin (SINEQUAN) 10  MG capsule TAKE 1 TO 2 CAPSULES AT BEDTIME   . etodolac (LODINE) 500 MG tablet Take 500 mg by mouth. 1-2 times a day   . Evening Primrose Oil 1000 MG CAPS    . famotidine (PEPCID) 20 MG tablet Take 1 tablet (20 mg total) by mouth 2 (two) times daily.   . Flaxseed, Linseed, (FLAX SEED OIL) 1000 MG CAPS Take by mouth.   . gabapentin (NEURONTIN) 300 MG capsule Take 1 capsule (300 mg total) by mouth 3 (three) times daily. 03/08/2018: Rx placed up front for patient pick up 03/08/18 EL[facility],RN  . Grape Seed 100 MG CAPS Take by mouth.   [name] Kitchen HYDROcodone-acetaminophen (NORCO/VICODIN) 5-325 MG tablet Take 1 tablet by mouth 3 (three) times daily as needed for moderate pain.   [name] Kitchen ipratropium (ATROVENT) 0.06 % nasal spray Place 2 sprays into both nostrils 2 (two) times daily.   [name] Kitchen L-Lysine 1000 MG TABS Take by mouth.   . Lactobacillus (PROBIOTIC ACIDOPHILUS PO) Take by mouth.   . levocetirizine (XYZAL) 5 MG tablet TAKE 1 TABLET BY MOUTH EVERY DAY IN THE EVENING   . loratadine-pseudoephedrine (QC LORATADINE-D) 10-240 MG 24 hr tablet Take 1 tablet by mouth daily.   [name] Kitchen LORazepam (ATIVAN) 1 MG tablet Take 1 tablet (1 mg total) by mouth at bedtime.   . Melatonin 10 MG TABS Take 1 tablet by mouth at bedtime.    . Multiple Vitamins-Minerals (HAIR/SKIN/NAILS/BIOTIN) TABS Take by mouth.   . predniSONE (DELTASONE) 20 MG tablet Take 6 pills x 1d, then 5 pills po x 1d, then 4 pills po x 1d, then 3 pills po x 1d, then 2 pills po x 1d, then 1 pill po x 1 d, then 1/2 pill po x 1d   . rizatriptan (MAXALT-MLT) 10 MG disintegrating tablet Take 1 tablet (10 mg total) by mouth as needed for migraine. May repeat in 2 hours if needed.  Max of 2 tablets in 24 hours, and 10 tablets per month   . sertraline (ZOLOFT) 50 MG tablet TAKE 1 TABLET BY MOUTH EVERY DAY (Patient taking differently: 1/2 pill daily)   . tiZANidine (ZANAFLEX) 2 MG tablet Take up to 4 pills a day   . VITAMIN D PO Take 1,000 Units by mouth daily.    . vitamin E 400 UNIT  capsule Take 400 Units by mouth daily.   [name] Kitchen zolpidem (AMBIEN) 10 MG tablet Take 10 mg by mouth at bedtime as needed. For sleep    No facility-administered encounter medications on file as of 12/27/2019.    Current Diagnosis: Patient Active Problem List   Diagnosis Date Noted  . 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 Disturbance of smell and taste 11/15/2019  . Depression 03/06/2019  . Diverticulosis 06/14/2018  . Hepatic steatosis 06/14/2018  . 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 Decreased pedal pulses 06/30/2016  . Pain in both lower extremities 06/30/2016  . Facial pain, atypical 06/08/2016  . Fibroids 02/12/2016  .  Chest pain 07/07/2015  . Dizziness and giddiness 07/07/2015  . Leiomyoma of uterus 07/07/2015  . Lower abdominal pain 07/07/2015  . Hip pain 03/03/2015  . 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 Multiple sclerosis ([place]) 06/26/2014  . 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 Other fatigue 06/26/2014  . Dysesthesia 06/26/2014  . Depression with anxiety 06/26/2014  . 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 Insomnia 06/26/2014  . Low serum vitamin D 06/26/2014    Follow-Up:  Pharmacist Review- Telephone appointment 12/31/19 confirmed with patient.  Pattricia Boss, [place] Pharmacist Assistant [phone]

## 2019-12-31 ENCOUNTER — Other Ambulatory Visit: Payer: Self-pay

## 2019-12-31 ENCOUNTER — Ambulatory Visit: Payer: Medicare Other

## 2019-12-31 DIAGNOSIS — F3342 Major depressive disorder, recurrent, in full remission: Secondary | ICD-10-CM

## 2019-12-31 DIAGNOSIS — G35 Multiple sclerosis: Secondary | ICD-10-CM

## 2019-12-31 DIAGNOSIS — E559 Vitamin D deficiency, unspecified: Secondary | ICD-10-CM

## 2019-12-31 DIAGNOSIS — R7303 Prediabetes: Secondary | ICD-10-CM

## 2019-12-31 DIAGNOSIS — F418 Other specified anxiety disorders: Secondary | ICD-10-CM

## 2019-12-31 NOTE — Chronic Care Management (AMB) (Signed)
Chronic Care Management Pharmacy  Name: Grace [name]  MRN: [id number] DOB: [birth year]  Chief Complaint/ HPI  Grace [name],  59 y.o. , female presents for their Initial CCM visit with the clinical pharmacist via telephone due to COVID-19 Pandemic.  PCP : [name], MD  Their chronic conditions include: Depression, Anxiety and Multiple sclerosis, prediabetes, Vitamin D deficiency, insomnia  Office Visits: 12/11/19: Fifth B12 injection (first monthly injection)  11/07/19 OV: B12 injection (will continue monthly injections from here now on). 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 Referred to CCM. Recurrent major depressive disorder stable. Continue current medications.  10/31/19: Third B12 injection  10/24/19: B12 injection  10/27/19: B12 injection  10/16/19 Telephone call: Scheduled first of 4 weekly B12 injection.   10/10/19 AWV and OV: 6 month follow up and med refill. Liver and kidney function stable. B12 levels are low. Start weekly B12 injections for 4 weeks then continue monthly (may help with balance issues and tingling in extremities). Vitamin D level is low. Referred to ENT for hypogeusia. Repeated COVID tests negative. Referred to medical weight management clinic.  09/10/19 Telephone call: Pt advised of drug interaction between tizanidine and famotidine. Pt states she will stop famotidine because she does not really have heartburn.  Consult Visits: 12/28/19 Ophthalmology OV w/ Dr. Manuella Ghazi: Presents for follow up. Pt reports pressure is up in her right eye and she is having ocular pain in right eye. Use Combigan and Lumigan daily as directed. Return in 6 months.  12/20/19 Psychology Video visit w/ Dr. Mallie Mussel  12/17/19 [facility] w/ Dr. Raliegh Scarlet: Discussed labs with pt. Pt following Category 1 eating plan 65% of the time.  Extensive dietary counseling. Referred to Dr. Mallie Mussel (Bariatric Psychologist for evaluation due to elevated PHQ-9 and  emotional eating struggles. Follow up in 2 weeks.  11/28/19 Ophthalmology OV w/ Dr. Susa Simmonds: 4 week recheck post bilateral cataract surgery. Still having some blurred vision and right hurts grade 2. Tearing comes and goes.   11/22/19 [facility] w/ Dr. Raliegh Scarlet: Presents for evaluation and treatment of obesity and related comorbidities. Diet assessed. Pt agrees to Category 1 Plan.   11/15/19 Neurology OV w/ Dr. Felecia Shelling: Presents for evaluation of loss of taste (PCP and ENT already seen). Scope study performed by ENT. Pt told she may be having allergies (some discharge present) or could be neurological. Taste came back some with prednisone pack. Altered taste likely due to altered sense of smell. MS unlikely to be the cause due to sudden onset. If symptoms worsen consider CT to rule out meningioma in the olfactory groove. Pt would prefer to remain off DMT for MS. Return in 3-4 months.   11/05/19 Ophthalmology OV w/ Dr. Susa Simmonds: Pt states right eye more blurry than last time.   10/01/19 Ophthalmology OV w/ Dr. 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 She is s/p phaco OS 07/05/2019. Patient is s/p phaco OD 06/21/2019. Pt has a hx of ERM OD. Referred by Dr Manuella Ghazi.  09/28/19 Ophthalmology OV w/ Dr. Manuella Ghazi: Restart Combigan twice daily in right eye. Planning for YAG with Dr. Susa Simmonds.      09/05/19 Neurology OV w/ Dr. Felecia Shelling: Gait is the same. Pt feels mildly off balance and had one fall. Strength is about the same. Sleep is poor (onset and maintenance). Pt prefers to remain off DMT for MS. Continue sertraline for depression. Increase activity and exercise. Encouraged weight loss. Return in 5 months or sooner  if needed.  CCM Encounters: 12/26/19 RN: Established care plan.   11/21/19 SW: Care coordination relating to wheelchair ramp for spouse and mold remediation.   11/15/19 SW: CCM consent. SDOH screening. Determined need for wheelchair ramp for husband and housing modification needs  due to mold in home.   Medications: Outpatient Encounter Medications as of 12/31/2019  Medication Sig Note  . Ascorbic Acid (VITAMIN C) 1000 MG tablet Take 1,000 mg by mouth daily.   [name] Kitchen aspirin EC 81 MG tablet Take 81 mg by mouth daily.   . baclofen (LIORESAL) 10 MG tablet TAKE 2 TABLETS (20 MG TOTAL) BY MOUTH 3 (THREE) TIMES DAILY.   . brimonidine-timolol (COMBIGAN) 0.2-0.5 % ophthalmic solution Place 1 drop into the right eye every 12 hours.   . Cholecalciferol (VITAMIN D) 50 MCG (2000 UT) CAPS Take 2,000 Units by mouth daily.    [name] Kitchen CINNAMON PO Take 1,200 mg by mouth daily.    . diphenhydrAMINE (BENADRYL) 25 MG tablet Take 25 mg by mouth 3 (three) times daily. Takes with Hydrocodone   . doxepin (SINEQUAN) 10 MG capsule TAKE 1 TO 2 CAPSULES AT BEDTIME   . ELDERBERRY PO Take by mouth daily.   . ergocalciferol (VITAMIN D2) 1.25 MG (50000 UT) capsule Take 50,000 Units by mouth 2 (two) times a week.   . etodolac (LODINE) 500 MG tablet Take 500 mg by mouth. 1-2 times a day   . Evening Primrose Oil 1000 MG CAPS 1300mg  daily   . famotidine (PEPCID) 20 MG tablet Take 1 tablet (20 mg total) by mouth 2 (two) times daily. (Patient taking differently: Take 20 mg by mouth daily. )   . Flaxseed, Linseed, (FLAX SEED OIL) 1000 MG CAPS Take 1,200 mg by mouth daily.    [name] Kitchen gabapentin (NEURONTIN) 300 MG capsule Take 1 capsule (300 mg total) by mouth 3 (three) times daily. (Patient taking differently: Take 300 mg by mouth 3 (three) times daily. Taking as needed) 03/08/2018: Rx placed up front for patient pick up 03/08/18 ELK,RN  . Grape Seed 50 MG CAPS Take 2 capsules by mouth daily.    [name] Kitchen HYDROcodone-acetaminophen (NORCO/VICODIN) 5-325 MG tablet Take 1 tablet by mouth 3 (three) times daily as needed for moderate pain.   [name] Kitchen ipratropium (ATROVENT) 0.06 % nasal spray Place 2 sprays into both nostrils 2 (two) times daily.   [name] Kitchen L-Lysine 1000 MG TABS Take by mouth. As needed   . Lactobacillus (PROBIOTIC ACIDOPHILUS PO) Take  by mouth daily. Digestive Advantage   . levocetirizine (XYZAL) 5 MG tablet TAKE 1 TABLET BY MOUTH EVERY DAY IN THE EVENING   . loratadine-pseudoephedrine (QC LORATADINE-D) 10-240 MG 24 hr tablet Take 1 tablet by mouth daily.   [name] Kitchen LORazepam (ATIVAN) 1 MG tablet Take 1 tablet (1 mg total) by mouth at bedtime.   . Lutein-Bilberry (BILBERRY PLUS LUTEIN PO) Take by mouth daily.   . Melatonin 10 MG TABS Take 1 tablet by mouth at bedtime.    . metFORMIN (GLUCOPHAGE) 500 MG tablet 1/2 tab daily with lunch   . Moringa 500 MG CAPS Take 2 capsules by mouth daily.   . Multiple Vitamins-Minerals (HAIR/SKIN/NAILS/BIOTIN) TABS Take by mouth daily.    . rizatriptan (MAXALT-MLT) 10 MG disintegrating tablet Take 1 tablet (10 mg total) by mouth as needed for migraine. May repeat in 2 hours if needed.  Max of 2 tablets in 24 hours, and 10 tablets per month   . sertraline (ZOLOFT) 50 MG tablet TAKE 1 TABLET BY  MOUTH EVERY DAY (Patient taking differently: 1/2 pill daily)   . tiZANidine (ZANAFLEX) 2 MG tablet Take up to 4 pills a day   . vitamin E 1000 UNIT capsule Take 1,000 Units by mouth daily.    [name] Kitchen zolpidem (AMBIEN) 10 MG tablet Take 10 mg by mouth at bedtime as needed. For sleep   . [DISCONTINUED] Cholecalciferol 50 MCG (2000 UT) TABS Take 2,000 Units by mouth daily.    . [DISCONTINUED] HYDROcodone-acetaminophen (NORCO/VICODIN) 5-325 MG tablet Take 1 tablet by mouth 3 (three) times daily as needed for moderate pain.   . [DISCONTINUED] levocetirizine (XYZAL) 5 MG tablet TAKE 1 TABLET BY MOUTH EVERY DAY IN THE EVENING   . [DISCONTINUED] amantadine (SYMMETREL) 100 MG capsule Take 100 mg by mouth 2 (two) times daily. Reported on 07/07/2015   . [DISCONTINUED] predniSONE (DELTASONE) 20 MG tablet Take 6 pills x 1d, then 5 pills po x 1d, then 4 pills po x 1d, then 3 pills po x 1d, then 2 pills po x 1d, then 1 pill po x 1 d, then 1/2 pill po x 1d   . [DISCONTINUED] VITAMIN D PO Take 1,000 Units by mouth daily.     No  facility-administered encounter medications on file as of 12/31/2019.    Current Diagnosis/Assessment:    Goals Addressed            This Visit's Progress   . Pharmacy Care Plan       CARE PLAN ENTRY (see longitudinal plan of care for additional care plan information)  Current Barriers:  . Chronic Disease Management support, education, and care coordination needs related to Depression, Anxiety, and Vitamin D deficiency, Multiple Sclerosis, and prediabetes   Depression with Anxiety . Pharmacist Clinical Goal(s): o Over the next 90 days, patient will work with PharmD and providers to improve/control symptoms of anxiety and depression . Current regimen:  o Sertraline 50mg  1/2 tablet daily o Lorazepam 1mg  at bedtime . Interventions: o Discussed importance of not stopping sertraline suddenly even if patient feels she does not need it - Patient to discuss with doctor and taper off slowly if approved o Discussed timing of sertraline administration (nighttime dosing improves adherence) - Sleep problems started long before patient started taking sertraline . Patient self care activities - Over the next 90 days, patient will: o Continue medications daily as directed o Speak with doctor before discontinuing any medications  Prediabetes Lab Results  Component Value Date/Time   HGBA1C 5.9 (H) 11/22/2019 05:10 PM   . Pharmacist Clinical Goal(s): o Over the next 90 days, patient will work with PharmD and providers to maintain A1c goal <6.5% . Current regimen:  o Metformin 500mg  1/2 tablet daily with lunch . Interventions: o Comprehensive medication review . Patient self care activities - Over the next 90 days, patient will: o Contact provider with any episodes of hypoglycemia o Focus on healthy diet and exercise  Multiple Sclerosis . Pharmacist Clinical Goal(s) o Over the next 180 days, patient will work with PharmD and providers to manage symptoms of multiple sclerosis . Current  regimen:  o N/A . Interventions: o Discussed various treatment options patient has tried for multiple sclerosis o Determined patient has not had worsening of symptoms in past 2 years (followed closely by Neurology) . Patient self care activities - Over the next 180 days, patient will: o Follow up with Neurologist as scheduled o Notify Neurologist of any worsening of symptoms   Vitamin D Deficiency . Pharmacist Clinical Goal(s) o  Over the next 180 days, patient will work with PharmD and providers to maintain normal Vitamin D levels . Current regimen:  [name] Kitchen Vitamin D2 50,000 units twice weekly (Tuesday and Friday)  . Vitamin D3 2000 units daily . Interventions: o Determined patient has been taking both strengths of Vitamin D for about a year due to difficulty maintaining adequate Vitamin D levels o Advised patient to spend 15 minutes outside daily with adequate sun protection . Patient self care activities - Over the next 180 days, patient will: o Spend 15 minutes outside daily with sun protection o Continue Vitamin D supplements as directed  Medication management . Pharmacist Clinical Goal(s): o Over the next 180 days, patient will work with PharmD and providers to maintain optimal medication adherence . Current [facility]: [facility] . Interventions o Comprehensive medication review performed. o Continue current medication management strategy . Patient self care activities - Over the next 90 days, patient will: o Focus on medication adherence by use of a pill box o Take medications as prescribed o Report any questions or concerns to PharmD and/or provider(s)  Initial goal documentation        Prediabetes   A1c goal <6.5%  Recent Relevant Labs: Lab Results  Component Value Date/Time   HGBA1C 5.9 (H) 11/22/2019 05:10 PM    Last diabetic Eye exam: No results found for: HMDIABEYEEXA  Last diabetic Foot exam: No results found for: HMDIABFOOTEX   Patient has failed these meds in  past: N/A Patient is currently controlled on the following medications: [name] Kitchen Metformin 500mg  1/2 tablet daily with lunch  Plan Continue current medications  Multiple Sclerosis  Relapsing remitting per Neurology  Patient has failed these meds in past: Teriflunomide Patient is currently controlled on the following medications: N/A  We discussed:    Pt was in a support group for MS  Cannot attend now until she gets vaccinated for COVID (but says she was advised not to get shot by one of her providers)  Pt mentions she has had allergic reactions to a lot of medications (for MS and others)  History of MS medications per patient  One medication caused her to have to get both hips replaced  One medication made her car sick  She thinks the last medication is what made her cataracts worse  She states she has tried 6 or 7 different MS medications  Not currently on any medication for MS  Pt says that she and the Neurologist have agreement that he won't talk to her about any more medications unless her MRI shows more lesions  Pt can't get MRIs right now due to her having braces, but she is supposed to get braces removed in about a year  Reports that last flare 2 years ago, 15 years was last flare before that  Plan Continue current course of action as directed by Neurologist  Pain   Patient has failed these meds in past: Soma Patient is currently uncontrolled on the following medications:   Hydrocodone/APAP 5/325mg  three times daily as needed for moderate pain  Baclofen 10mg  2 tablets three times daily  Tizanidine 2mg  up to 4 tablets daily  Etodolac 500mg  1-2 times daily  Gabapentin 300mg  three times daily  We discussed:    Pt doesn't feel that Hydrocodone fully works  Takes gabapentin when numbness/tingling worsens and will take for about a week or so then stops  If she takes too much she will start itching (anything over 300mg )  She also states that it  makes her  sleepy so she doesn't take when she has to drive  Takes as needed  Baclofen pt takes 1 tablet before bedtime and 1 tizanidine tablet  States they work if she takes them together  Pt states she used to take baclofen by itself and then tizanidine by itself, but they both stopped working so now she is taking them together  Normally only takes Hydrocodone at night, sometimes in the morning if she doesn't have to drive anywhere  Takes Etodolac twice daily  Plan Continue current medications   GERD   Patient has failed these meds in past: N/A Patient is currently controlled on the following medications:   Famotidine 20mg  once daily  We discussed:    No symptoms of heartburn recently  Pt states Pepcid was decreased to once daily about 2 months ago  Previously had heartburn 3-4 times weekly (before starting medication)  Takes at night before bedtime, pt said she has more problems with heartburn when she lays down  Plan Continue current medications   Vitamin D Deficiency   Patient has failed these meds in past: N/A Patient is currently controlled on the following medications:  [name] Kitchen Vitamin D2 50,000 units twice weekly (Tuesday and Friday)  . Vitamin D3 2000 units daily  Vit D, 25-Hydroxy  Date Value Ref Range Status  10/10/2019 35.5 30.0 - 100.0 ng/mL Final    Comment:    Vitamin D deficiency has been defined by the [place] practice guideline as a level of serum 25-OH vitamin D less than 20 ng/mL (1,2). The Endocrine Society went on to further define vitamin D insufficiency as a level between 21 and 29 ng/mL (2). 1. IOM (Institute of Medicine). 2010. Dietary reference    intakes for calcium and D. [place]: The    [employer]. 2. Holick MF, [place], Bischoff-Ferrari HA, et al.    Evaluation, treatment, and prevention of vitamin D    deficiency: an Endocrine Society clinical practice    guideline. JCEM. 2011 Jul;  96(7):1911-30.     We discussed:    Pt has been taking both strengths together for about a year  Pt is red-headed and fair skinned, so she does not spend a lot of time outside   Plan Continue current medications   Insomnia   Patient has failed these meds in past: Nortriptyline, trazodone Patient is currently uncontrolled on the following medications:   Doxepin 10mg  2 capsules at bedtime  Melatonin 5mg  2 capsules at night  Zolpidem 10mg  at bedtime as needed for sleep  We discussed:    Pt only takes zolpidem a couple of times a year  She is afraid to take zolpidem because of side effects her friend had while taking it (friend got a DUI and doesn't remember anything)  Only takes zolpidem when absolutely necessary, but it helps  Sleep is a big problem for pt  Goes to bed around 9-10pm, but cannot fall asleep until sometimes 4am  Doesn't get restful sleep  Gets up around 10-11am depending on doctor's office visits  Does not sleep with the TV on  Has had sleep issues for a long time  Dr. Felecia Shelling is managing sleep problems  Sleeps with a fan on  Can't fall asleep, pt thinks its because she can't get comfortable  Sometimes can't stay asleep  Has tried to take medications (pain, muscle relaxants) earlier in the evening but it made her more dizzy  Pt takes  her evening medicines around 9-10pm  Recommend pt take some meds with dinner and some at bedtime (instead of all at bedtime which causes dizziness at times)  Plan Continue current medications  Review alternative sleep medications  Depression   Patient has failed these meds in past: Lexapro, Prozac Patient is currently controlled on the following medications:   Sertraline 50mg  1/2 tablet daily  We discussed:    Started taking sertraline right after dog died for depression  Pt feels she does not need it anymore  Pt takes sertraline at night, has always taken at night  Pt remembers to take it if she  takes it at night  If she were to take it in the morning she would be more likely to forget it  Pt says she had trouble sleeping long before taking sertraline  Plan Continue current medications   Anxiety    Patient has failed these meds in past: Alprazolam, diazepam Patient is currently controlled on the following medications:   Lorazepam 1mg  at bedtime  We discussed:    Pt takes as needed, originally prescribed for when she had MRIs  Only uses once or twice a year  Plan Continue current medications   Cataracts   Patient has failed these meds in past: N/A Patient is currently uncontrolled on the following medications:   Combigan 1 drop in right eye twice daily  Lumigan 0.01% 1 drop in the right eye nightly  We discussed:     Pt has had cataract surgeries (reports 5 surgeries) with most recent in February 2021  Pt supposed to start on a new eye drop but waiting for insurance approval  Going to see a glaucoma specialist on Wednesday  Pressure in eye is still up  Plan Continue current medications  Health Maintenance   Patient is currently on the following medications:  . Aspirin 81mg  before bed daily . Vitamin E 1000 units daily . Vitamin C 1000mg  daily . Flaxseed oil 1200mg  daily  . Cinnamon 1200mg  daily . Grape seed extract 50mg  2 capsules daily . Hair skin and nails daily . Elderberry gummy daily . Moringa 1000mg  daily . Evening Primrose oil 1300mg  daily . Probiotic (Digestive Advantage) gummy 2 nightly . Bilberry w/ Lutein daily . Lysine 1000mg  if needed for cold sores  We discussed:  . Has not used Lysine in at least a year  Plan Continue current medications   Migraines   Patient has failed these meds in past: N/A Patient is currently controlled on the following medications:   Rizatriptan 10mg  ODT as needed for migraine. May repeat in 2 hrs if needed (max 2 tablets in 24 hours and 10 tablets per month)  We discussed:    Pt states she  only takes Maxalt 2-3 times a year  Plan Continue current medications  Allergies   Patient has failed these meds in past: N/A Patient is currently controlled on the following medications:   Atrovent 0.06% 2 sprays in each nostril twice daily . Benadryl 25mg  three times daily . Levocetirizine 5mg  in the evening . Loratadine-D 24hr 10-240mg  in the morning  We discussed:    Pt doing 2 sprays of Atrovent in each nostril once daily  She reports she went to ENT because she had lost sense of taste and smell, was put on nasal spray and steroids  Taste and smell has come back  Allergies are fine if she takes all allergy medications  Dr. Felecia Shelling did a test a few years ago and said  pt is allergic to dog dander, pt has 2 mostly indoor dogs . Pt takes 1 Benadryl at night with Hydrocodone, because hydrocodone makes her itch . She reports taking Xyzal for allergies . She takes Loratadine-D in the morning . Pt states she was advised by Dr. Baird Cancer to take both allergy medications 1 (Loratadine -D) in the morning 1 at night (levocetirizine)  Plan Continue current medications  Vaccines   Reviewed and discussed patient's vaccination history.    Immunization History  Administered Date(s) Administered  . Tdap 03/21/2012   We discussed:  Pt states Dr. Felecia Shelling told her not to get COVID shot d/t previous allergic reactions  Pt says she had an allergic reaction to the flu shot  Plan Review and discuss at follow up appointment  Medication Management   [facility] Uses pill box? Unknown, discuss at follow up  We discussed: . Importance of taking each medications daily as directed . Pt denies issues with side effects from her medications o She did mention some itching with certain medications but she either takes them as needed or uses Benadryl to help with itching  Plan Continue current medication management strategy  Follow up: 2 month phone visit    Jannette Fogo, PharmD Clinical Pharmacist [facility] [phone]

## 2019-12-31 NOTE — Chronic Care Management (AMB) (Signed)
Chronic Care Management   Follow Up Note   12/26/2019 Name: Grace [name] MRN: [id number] DOB: March 11, 1961  Referred by: [name], MD Reason for referral : Chronic Care Management (RQ Initial RN CM Call )   Grace [name] is a 59 y.o. year old female who is a primary care patient of [name], MD. The CCM team was consulted for assistance with chronic disease management and care coordination needs.    Review of patient status, including review of consultants reports, relevant laboratory and other test results, and collaboration with appropriate care team members and the patient's provider was performed as part of comprehensive patient evaluation and provision of chronic care management services.    SDOH (Social Determinants of Health) assessments performed: Yes - no acute challenges identified at this time  See Care Plan activities for detailed interventions related to [place])   Placed initial CCM RN CM outbound call to patient to assess for CCM needs, a care plan was established.     Outpatient Encounter Medications as of 12/26/2019  Medication Sig Note  . Ascorbic Acid (VITAMIN C) 1000 MG tablet Take 1,000 mg by mouth daily.   [name] Kitchen aspirin EC 81 MG tablet Take 81 mg by mouth daily.   . baclofen (LIORESAL) 10 MG tablet TAKE 2 TABLETS (20 MG TOTAL) BY MOUTH 3 (THREE) TIMES DAILY.   . brimonidine-timolol (COMBIGAN) 0.2-0.5 % ophthalmic solution Place 1 drop into the right eye every 12 hours.   . Cholecalciferol 50 MCG (2000 UT) TABS Take 2,000 Units by mouth daily.    . Cinnamon 500 MG capsule Take 1,200 mg by mouth daily.    . diphenhydrAMINE (BENADRYL) 25 MG tablet Take 25 mg by mouth 3 (three) times daily.   [name] Kitchen doxepin (SINEQUAN) 10 MG capsule TAKE 1 TO 2 CAPSULES AT BEDTIME   . etodolac (LODINE) 500 MG tablet Take 500 mg by mouth. 1-2 times a day   . Evening Primrose Oil 1000 MG CAPS    . famotidine (PEPCID) 20 MG tablet Take 1 tablet (20 mg total) by mouth 2  (two) times daily.   . Flaxseed, Linseed, (FLAX SEED OIL) 1000 MG CAPS Take 1,200 mg by mouth daily.    [name] Kitchen gabapentin (NEURONTIN) 300 MG capsule Take 1 capsule (300 mg total) by mouth 3 (three) times daily. 03/08/2018: Rx placed up front for patient pick up 03/08/18 ELK,RN  . Grape Seed 100 MG CAPS Take by mouth.   [name] Kitchen HYDROcodone-acetaminophen (NORCO/VICODIN) 5-325 MG tablet Take 1 tablet by mouth 3 (three) times daily as needed for moderate pain.   [name] Kitchen ipratropium (ATROVENT) 0.06 % nasal spray Place 2 sprays into both nostrils 2 (two) times daily.   [name] Kitchen L-Lysine 1000 MG TABS Take by mouth.   . Lactobacillus (PROBIOTIC ACIDOPHILUS PO) Take by mouth.   . levocetirizine (XYZAL) 5 MG tablet TAKE 1 TABLET BY MOUTH EVERY DAY IN THE EVENING   . loratadine-pseudoephedrine (QC LORATADINE-D) 10-240 MG 24 hr tablet Take 1 tablet by mouth daily.   [name] Kitchen LORazepam (ATIVAN) 1 MG tablet Take 1 tablet (1 mg total) by mouth at bedtime.   . Melatonin 10 MG TABS Take 1 tablet by mouth at bedtime.    . Multiple Vitamins-Minerals (HAIR/SKIN/NAILS/BIOTIN) TABS Take by mouth.   . predniSONE (DELTASONE) 20 MG tablet Take 6 pills x 1d, then 5 pills po x 1d, then 4 pills po x 1d, then 3 pills po x 1d, then 2 pills po x 1d, then 1 pill  po x 1 d, then 1/2 pill po x 1d   . rizatriptan (MAXALT-MLT) 10 MG disintegrating tablet Take 1 tablet (10 mg total) by mouth as needed for migraine. May repeat in 2 hours if needed.  Max of 2 tablets in 24 hours, and 10 tablets per month   . sertraline (ZOLOFT) 50 MG tablet TAKE 1 TABLET BY MOUTH EVERY DAY (Patient taking differently: 1/2 pill daily)   . tiZANidine (ZANAFLEX) 2 MG tablet Take up to 4 pills a day   . VITAMIN D PO Take 1,000 Units by mouth daily.    . vitamin E 1000 UNIT capsule Take 1,000 Units by mouth daily.    [name] Kitchen zolpidem (AMBIEN) 10 MG tablet Take 10 mg by mouth at bedtime as needed. For sleep    No facility-administered encounter medications on file as of 12/26/2019.      Objective:  Lab Results  Component Value Date   HGBA1C 5.9 (H) 11/22/2019   Lab Results  Component Value Date   LDLCALC 71 11/22/2019   CREATININE 0.66 10/10/2019   BP Readings from Last 3 Encounters:  12/17/19 120/80  12/11/19 128/78  11/22/19 123/79    Goals Addressed      Patient Stated   .  "I am having issues with my vision" (pt-stated)        CARE PLAN ENTRY (see longitudinal plan of care for additional care plan information)  Current Barriers:  [name] Kitchen Knowledge Deficits related to evaluation and treatment of glaucoma . Chronic Disease Management support and education needs related to MS, Vitamin D Deficiency, Recurrent major depression, Class 1 Obesity due to excess calories, Prediabetes  Nurse Case Manager Clinical Goal(s):  [name] Kitchen Over the next 90 days, patient will work with Ophthalmology  to address needs related to evaluation and treatment of glaucoma  Interventions:  . Inter-disciplinary care team collaboration (see longitudinal plan of care) . Evaluation of current treatment plan related to glaucoma and patient's adherence to plan as established by provider . Determined patient is established with an eye specialist who is managing a new onset of glaucoma following Cataract surgery  . Discussed plans with patient for ongoing care management follow up and provided patient with direct contact information for care management team  Patient Self Care Activities:  . Self administers medications as prescribed . Attends all scheduled provider appointments . Calls pharmacy for medication refills . Performs ADL's independently . Performs IADL's independently . Calls provider office for new concerns or questions  Initial goal documentation     .  "I am working to lose weight" (pt-stated)        [place] (see longitudinal plan of care for additional care plan information)  Current Barriers:  [name] Kitchen Knowledge Deficits related to disease process and Self Health  management for improved Weight Management  . Chronic Disease Management support and education needs related to MS, Vitamin D Deficiency, Recurrent major depression, Class 1 Obesity due to excess calories, Prediabetes  Nurse Case Manager Clinical Goal(s):  [name] Kitchen Over the next 90 days, patient will work with the CCM team and PCP to address needs related to disease education and support for improved Weight Management   Interventions:  . Inter-disciplinary care team collaboration (see longitudinal plan of care) . Evaluation of current treatment plan related to Obesity and patient's adherence to plan as established by provider. . Provided education to patient re: dietary and exercise recommendations and how to get started: Determined patient is newly established with [facility] and is adhering to their treatment recommendations . Reviewed medications with patient and discussed indication, dosage and frequency for newly approved weight loss drug "Reagen.Frieze": Encouraged patient to further discuss with PCP Dr. Baird Cancer . Discussed plans with patient for ongoing care management follow up and provided patient with direct contact information for care management team . Provided patient with printed educational materials related to How to get started with weight loss; Reagen.[email or web address]  Patient Self Care Activities:  . Self administers medications as prescribed . Attends all scheduled provider appointments . Calls pharmacy for medication refills . Performs ADL's independently . Performs IADL's independently . Calls provider office for new concerns or questions  Initial goal documentation     .  "I would like to get my Cholesterol down" (pt-stated)        CARE PLAN ENTRY (see longitudinal plan of care for additional care plan information)  Current Barriers:  [name] Kitchen Knowledge Deficits related to disease process and Self Health management of High Cholesterol . Chronic Disease Management support and  education needs related to MS, Vitamin D Deficiency, Recurrent major depression, Class 1 Obesity due to excess calories, Prediabetes  Nurse Case Manager Clinical Goal(s):  [name] Kitchen Over the next 90 days, patient will work with the CCM team and PCP to address needs related to disease education and support for improved Self Health management of High Cholesterol  Interventions:  . Inter-disciplinary care team collaboration (see longitudinal plan of care) . Evaluation of current treatment plan related to High Cholesterol  and patient's adherence to plan as established by provider. . Provided education to patient re: current total Cholesterol is elevated; Educated on target Cholesterol levels; Educated on dietary and exercise recommenations  . Reviewed medications with patient and discussed patient is not currently taking a prescription medication for high Cholesterol . Discussed plans with patient for ongoing care management follow up and provided patient with direct contact information for care management team . Provided patient with printed educational materials related to [place]  Patient Self Care Activities:  . Self administers medications as prescribed . Attends all scheduled provider appointments . Calls pharmacy for medication refills . Calls provider office for new concerns or questions  Initial goal documentation     .  "to get my A1c back down to normal range" (pt-stated)        CARE PLAN ENTRY (see longitudinal plan of care for additional care plan information)  Current Barriers:  [name] Kitchen Knowledge Deficits related to disease process and Self Health management of Prediabetes . Chronic Disease Management support and education needs related to MS, Vitamin D Deficiency, Recurrent major depression, Class 1 Obesity due to excess calories, Prediabetes  Nurse Case Manager Clinical Goal(s):  [name] Kitchen Over the next 90 days, patient will work with the CCM team and PCP to address needs  related to disease education and support for improved Self Health management of Prediabetes  Interventions:  . Inter-disciplinary care team collaboration (see longitudinal plan of care) . Evaluation of current treatment plan related to Prediabetes and patient's adherence to plan as established by provider. . Provided education to patient re: A1c is in prediabetes range at 5.9 %; Educated on dietary and exercise recommendations  . Reviewed medications with patient and discussed patient is not currently prescribed diabetic medication  . Discussed plans with patient for ongoing care management follow up and provided patient with direct contact information for care management team . Provided patient with printed educational materials related to Prediabetes  Patient Self Care Activities:  . Self administers medications as prescribed . Attends all scheduled provider appointments . Calls pharmacy for medication refills . Calls provider office for new concerns or questions  Initial goal documentation       Other   .  COMPLETED: Assist with Chronic Care Management and Care Coordination needs        CARE PLAN ENTRY (see longtitudinal plan of care for additional care plan information)   Current Barriers:  . Chronic Disease Management support, education, and care coordination needs related to MS, Vitamin D Deficiency  Case Manager Clinical Goal(s):  [name] Kitchen Over the next 30 days, patient will work with BSW to address needs related to [employer] in patient with MS, Vitamin D Deficiency  Interventions:  . Collaborated with BSW to initiate plan of care to address needs related to  [employer]  in patient with MS, Vitamin D Deficiency  Patient Self Care Activities:  . Self administers medications as prescribed . Attends all scheduled provider appointments . Calls provider office for new concerns or questions  Initial goal documentation        Plan:   Telephone follow up  appointment with care management team member scheduled for:  02/21/20   Barb Merino, RN, BSN, CCM Care Management Coordinator [place] [facility]  Direct Phone: [phone]

## 2019-12-31 NOTE — Patient Instructions (Signed)
Visit Information  Goals Addressed      Patient Stated   .  "I am having issues with my vision" (pt-stated)        CARE PLAN ENTRY (see longitudinal plan of care for additional care plan information)  Current Barriers:  [name] Kitchen Knowledge Deficits related to evaluation and treatment of glaucoma . Chronic Disease Management support and education needs related to MS, Vitamin D Deficiency, Recurrent major depression, Class 1 Obesity due to excess calories, Prediabetes  Nurse Case Manager Clinical Goal(s):  [name] Kitchen Over the next 90 days, patient will work with Ophthalmology  to address needs related to evaluation and treatment of glaucoma  Interventions:  . Inter-disciplinary care team collaboration (see longitudinal plan of care) . Evaluation of current treatment plan related to glaucoma and patient's adherence to plan as established by provider . Determined patient is established with an eye specialist who is managing a new onset of glaucoma following Cataract surgery  . Discussed plans with patient for ongoing care management follow up and provided patient with direct contact information for care management team  Patient Self Care Activities:  . Self administers medications as prescribed . Attends all scheduled provider appointments . Calls pharmacy for medication refills . Performs ADL's independently . Performs IADL's independently . Calls provider office for new concerns or questions  Initial goal documentation     .  "I am working to lose weight" (pt-stated)        [place] (see longitudinal plan of care for additional care plan information)  Current Barriers:  [name] Kitchen Knowledge Deficits related to disease process and Self Health management for improved Weight Management  . Chronic Disease Management support and education needs related to MS, Vitamin D Deficiency, Recurrent major depression, Class 1 Obesity due to excess calories, Prediabetes  Nurse Case Manager Clinical Goal(s):   [name] Kitchen Over the next 90 days, patient will work with the CCM team and PCP to address needs related to disease education and support for improved Weight Management   Interventions:  . Inter-disciplinary care team collaboration (see longitudinal plan of care) . Evaluation of current treatment plan related to Obesity and patient's adherence to plan as established by provider. . Provided education to patient re: dietary and exercise recommendations and how to get started: Determined patient is newly established with [facility] and is adhering to their treatment recommendations . Reviewed medications with patient and discussed indication, dosage and frequency for newly approved weight loss drug "Reagen.Frieze": Encouraged patient to further discuss with PCP Dr. Baird Cancer . Discussed plans with patient for ongoing care management follow up and provided patient with direct contact information for care management team . Provided patient with printed educational materials related to How to get started with weight loss; Reagen.Frieze  Patient Self Care Activities:  . Self administers medications as prescribed . Attends all scheduled provider appointments . Calls pharmacy for medication refills . Performs ADL's independently . Performs IADL's independently . Calls provider office for new concerns or questions  Initial goal documentation     .  "I would like to get my Cholesterol down" (pt-stated)        CARE PLAN ENTRY (see longitudinal plan of care for additional care plan information)  Current Barriers:  [name] Kitchen Knowledge Deficits related to disease process and Self Health management of High Cholesterol . Chronic Disease Management support and education needs related to MS, Vitamin D Deficiency, Recurrent major depression, Class 1 Obesity due to excess calories, Prediabetes  Nurse Case Manager Clinical Goal(s):  [name] Kitchen Over the next 90 days, patient will work with the CCM team and PCP to address needs  related to disease education and support for improved Self Health management of High Cholesterol  Interventions:  . Inter-disciplinary care team collaboration (see longitudinal plan of care) . Evaluation of current treatment plan related to High Cholesterol  and patient's adherence to plan as established by provider. . Provided education to patient re: current total Cholesterol is elevated; Educated on target Cholesterol levels; Educated on dietary and exercise recommenations  . Reviewed medications with patient and discussed patient is not currently taking a prescription medication for high Cholesterol . Discussed plans with patient for ongoing care management follow up and provided patient with direct contact information for care management team . Provided patient with printed educational materials related to [place]  Patient Self Care Activities:  . Self administers medications as prescribed . Attends all scheduled provider appointments . Calls pharmacy for medication refills . Calls provider office for new concerns or questions  Initial goal documentation     .  "to get my A1c back down to normal range" (pt-stated)        CARE PLAN ENTRY (see longitudinal plan of care for additional care plan information)  Current Barriers:  [name] Kitchen Knowledge Deficits related to disease process and Self Health management of Prediabetes . Chronic Disease Management support and education needs related to MS, Vitamin D Deficiency, Recurrent major depression, Class 1 Obesity due to excess calories, Prediabetes  Nurse Case Manager Clinical Goal(s):  [name] Kitchen Over the next 90 days, patient will work with the CCM team and PCP to address needs related to disease education and support for improved Self Health management of Prediabetes  Interventions:  . Inter-disciplinary care team collaboration (see longitudinal plan of care) . Evaluation of current treatment plan related to Prediabetes and  patient's adherence to plan as established by provider. . Provided education to patient re: A1c is in prediabetes range at 5.9 %; Educated on dietary and exercise recommendations  . Reviewed medications with patient and discussed patient is not currently prescribed diabetic medication  . Discussed plans with patient for ongoing care management follow up and provided patient with direct contact information for care management team . Provided patient with printed educational materials related to Prediabetes  Patient Self Care Activities:  . Self administers medications as prescribed . Attends all scheduled provider appointments . Calls pharmacy for medication refills . Calls provider office for new concerns or questions  Initial goal documentation       Other   .  COMPLETED: Assist with Chronic Care Management and Care Coordination needs        CARE PLAN ENTRY (see longtitudinal plan of care for additional care plan information)   Current Barriers:  . Chronic Disease Management support, education, and care coordination needs related to MS, Vitamin D Deficiency  Case Manager Clinical Goal(s):  [name] Kitchen Over the next 30 days, patient will work with BSW to address needs related to [employer] in patient with MS, Vitamin D Deficiency  Interventions:  . Collaborated with BSW to initiate plan of care to address needs related to  [employer]  in patient with MS, Vitamin D Deficiency  Patient Self Care Activities:  . Self administers medications as prescribed . Attends all scheduled provider appointments . Calls provider office for new concerns or questions  Initial goal documentation       Patient verbalizes understanding of instructions  provided today.   Telephone follow up appointment with care management team member scheduled for: 02/21/20  Barb Merino, RN, BSN, CCM Care Management Coordinator [place] [facility]  Direct Phone:  [phone]

## 2020-01-01 ENCOUNTER — Ambulatory Visit (INDEPENDENT_AMBULATORY_CARE_PROVIDER_SITE_OTHER): Payer: Medicare Other | Admitting: Family Medicine

## 2020-01-01 ENCOUNTER — Encounter (INDEPENDENT_AMBULATORY_CARE_PROVIDER_SITE_OTHER): Payer: Self-pay | Admitting: Family Medicine

## 2020-01-01 ENCOUNTER — Telehealth (INDEPENDENT_AMBULATORY_CARE_PROVIDER_SITE_OTHER): Payer: Self-pay

## 2020-01-01 VITALS — BP 122/90 | HR 82 | Temp 97.4°F | Ht 67.0 in | Wt 215.0 lb

## 2020-01-01 DIAGNOSIS — E559 Vitamin D deficiency, unspecified: Secondary | ICD-10-CM

## 2020-01-01 DIAGNOSIS — Z6833 Body mass index (BMI) 33.0-33.9, adult: Secondary | ICD-10-CM

## 2020-01-01 DIAGNOSIS — E669 Obesity, unspecified: Secondary | ICD-10-CM

## 2020-01-01 DIAGNOSIS — Z9189 Other specified personal risk factors, not elsewhere classified: Secondary | ICD-10-CM

## 2020-01-01 DIAGNOSIS — R7303 Prediabetes: Secondary | ICD-10-CM | POA: Diagnosis not present

## 2020-01-01 DIAGNOSIS — E538 Deficiency of other specified B group vitamins: Secondary | ICD-10-CM | POA: Diagnosis not present

## 2020-01-01 DIAGNOSIS — Z6835 Body mass index (BMI) 35.0-35.9, adult: Secondary | ICD-10-CM | POA: Insufficient documentation

## 2020-01-01 NOTE — Telephone Encounter (Addendum)
Call to patient to find out who prescribed her vitamin d medication twice weekly.  Pt states that her PCP, Dr Baird Cancer, has prescribed it for her.  Pt also states that she is also taking OTC vitamin D, 1,000 units every day because even with her prescription, she is still low.   Ergocalciferol  Dispensed Days Supply Quantity Provider Pharmacy  VITAMIN D2 1.25MG (50,000 UNIT) 11/21/2019 90 24 each SANDERS,ROBYN NICHOLLE [id number] [place]  VITAMIN D2 1.25MG (50,000 UNIT) 08/23/2019 90 24 each SANDERS,ROBYN NICHOLLE [id number] [place]     This is from the dispense history in her chart.    Pt would like to know should she continue to take her OTC vitamin D.

## 2020-01-02 NOTE — Progress Notes (Signed)
Chief Complaint:   OBESITY Grace [name] is here to discuss her progress with her obesity treatment plan along with follow-up of her obesity related diagnoses. Grace [name] is on the Category 1 Plan and states she is following her eating plan approximately 20% of the time. Grace [name] states she is exercising for 0 minutes 0 times per week.  Today's visit was #: 3 Starting weight: 221 lbs Starting date: 11/22/2019 Today's weight: 215 lbs Today's date: 01/01/2020 Total lbs lost to date: 6 lbs Total lbs lost since last in-office visit: 5 lbs  Interim History: Grace [name] says she needs something to eat for breakfast that is quick and she does not have to cook.  She reports increased hunger at night after dinner.  She says she skips breakfast, and most of the time lunch.  She is not sure about snack calories.  Subjective:   1. Prediabetes Grace [name] has a diagnosis of prediabetes based on her elevated HgA1c and was informed this puts her at greater risk of developing diabetes. She continues to work on diet and exercise to decrease her risk of diabetes. She denies nausea or hypoglycemia.    Lab Results  Component Value Date   HGBA1C 5.9 (H) 11/22/2019   Lab Results  Component Value Date   INSULIN 29.8 (H) 10/10/2019   2. Vitamin D deficiency Grace [name]'s Vitamin D level was 35.5 on 10/10/2019. She is currently taking OTC vitamin D 1,000 IU each day. She denies nausea, vomiting or muscle weakness.  After last office visit, she says she never received her prescription for vitamin D.  She endorses fatigue and tiredness.  3. Vitamin B 12 deficiency She is not a vegetarian.  She does not have a previous diagnosis of pernicious anemia.  She does not have a history of weight loss surgery.  She is on injections per her PCP, and tolerating them well.  Lab Results  Component Value Date   VITAMINB12 249 10/10/2019   4. At risk for diabetes mellitus Grace [name] is at higher than average risk for developing diabetes due to her  obesity.   Assessment/Plan:   1. Prediabetes Grace [name] will continue to work on weight loss, exercise, and decreasing simple carbohydrates to help decrease the risk of diabetes.  Weight loss with prudent nutritional plan, importance of not skipping and decreasing simple carbs was discussed with her.    2. Vitamin D deficiency Low Vitamin D level contributes to fatigue and are associated with obesity, breast, and colon cancer. She agrees to start to take prescription Vitamin D @50 ,000 IU twice weekly and OTC 1,000 IU daily and will follow-up for routine testing of Vitamin D, at least 2-3 times per year to avoid over-replacement.  She will increase her vitamin K intake via her prudent nutritional plan and we will continue to monitor.  3. Vitamin B 12 deficiency The diagnosis was reviewed with the patient. Counseling provided today, see below. We will continue to monitor. Orders and follow up as documented in patient record.  Continue treatment plan of injections per PCP.  Will continue to monitor levels.  Counseling  The body needs vitamin B12: to make red blood cells; to make DNA; and to help the nerves work properly so they can carry messages from the brain to the body.   The main causes of vitamin B12 deficiency include dietary deficiency, digestive diseases, pernicious anemia, and having a surgery in which part of the stomach or small intestine is removed.   Certain medicines can make it harder  for the body to absorb vitamin B12. These medicines include: heartburn medications; some antibiotics; some medications used to treat diabetes, gout, and high cholesterol.   In some cases, there are no symptoms of this condition. If the condition leads to anemia or nerve damage, various symptoms can occur, such as weakness or fatigue, shortness of breath, and numbness or tingling in your hands and feet.    Treatment:  o May include taking vitamin B12 supplements.  o Avoid alcohol.  o Eat lots of healthy  foods that contain vitamin B12: - Beef, pork, chicken, [place], and organ meats, such as liver.  - Seafood: This includes clams, rainbow trout, salmon, tuna, and haddock. Eggs.  - Cereal and dairy products that are fortified: This means that vitamin B12 has been added to the food.   4. At risk for diabetes mellitus Grace [name] was given 8+ minutes of diabetes education and counseling today. We discussed intensive lifestyle modifications today with an emphasis on weight loss as well as increasing exercise and decreasing simple carbohydrates in her diet. We also reviewed medication options with an emphasis on risk versus benefit of those discussed.   Repetitive spaced learning was employed today to elicit superior memory formation and behavioral change.  5. Class 1 obesity with serious comorbidity and body mass index (BMI) of 33.0 to 33.9 in adult, unspecified obesity type Grace [name] is currently in the action stage of change. As such, her goal is to continue with weight loss efforts. She has agreed to the Category 1 Plan, but no skipping meals.   Exercise goals: As is.  Behavioral modification strategies: increasing lean protein intake, decreasing simple carbohydrates, no skipping meals, keeping healthy foods in the home and planning for success.  Grace [name] has agreed to follow-up with our clinic in 2 weeks. She was informed of the importance of frequent follow-up visits to maximize her success with intensive lifestyle modifications for her multiple health conditions.   Objective:   Blood pressure 122/90, pulse 82, temperature (!) 97.4 F (36.3 C), height 5\' 7"  (1.702 m), weight 215 lb (97.5 kg), last menstrual period 04/09/2013, SpO2 97 %. Body mass index is 33.67 kg/m.  General: Cooperative, alert, well developed, in no acute distress. HEENT: Conjunctivae and lids unremarkable. Cardiovascular: Regular rhythm.  Lungs: Normal work of breathing. Neurologic: No focal deficits.   Lab Results  Component  Value Date   CREATININE 0.66 10/10/2019   BUN 12 10/10/2019   NA 143 10/10/2019   K 4.3 10/10/2019   CL 106 10/10/2019   CO2 21 10/10/2019   Lab Results  Component Value Date   ALT 31 10/10/2019   AST 32 10/10/2019   ALKPHOS 104 10/10/2019   BILITOT 0.6 10/10/2019   Lab Results  Component Value Date   HGBA1C 5.9 (H) 11/22/2019   Lab Results  Component Value Date   INSULIN 29.8 (H) 10/10/2019   Lab Results  Component Value Date   TSH 1.500 11/22/2019   Lab Results  Component Value Date   CHOL 205 (H) 11/22/2019   HDL 108 11/22/2019   LDLCALC 71 11/22/2019   TRIG 161 (H) 11/22/2019   CHOLHDL 1.9 11/22/2019   Lab Results  Component Value Date   WBC 10.3 11/22/2019   HGB 13.7 11/22/2019   HCT 40.3 11/22/2019   MCV 93 11/22/2019   PLT 275 11/22/2019   Attestation Statements:   Reviewed by clinician on day of visit: allergies, medications, problem list, medical history, surgical history, family history, social history, and  previous encounter notes.  I, Water quality scientist, CMA, am acting as Location manager for [employer], DO.  I have reviewed the above documentation for accuracy and completeness, and I agree with the above. Mellody Dance, DO

## 2020-01-03 ENCOUNTER — Telehealth (INDEPENDENT_AMBULATORY_CARE_PROVIDER_SITE_OTHER): Payer: Medicare Other | Admitting: Psychology

## 2020-01-03 DIAGNOSIS — F3289 Other specified depressive episodes: Secondary | ICD-10-CM | POA: Diagnosis not present

## 2020-01-08 ENCOUNTER — Ambulatory Visit (INDEPENDENT_AMBULATORY_CARE_PROVIDER_SITE_OTHER): Payer: Medicare Other | Admitting: Psychology

## 2020-01-08 DIAGNOSIS — F064 Anxiety disorder due to known physiological condition: Secondary | ICD-10-CM | POA: Diagnosis not present

## 2020-01-15 ENCOUNTER — Other Ambulatory Visit: Payer: Self-pay

## 2020-01-15 ENCOUNTER — Ambulatory Visit (INDEPENDENT_AMBULATORY_CARE_PROVIDER_SITE_OTHER): Payer: Medicare Other

## 2020-01-15 VITALS — BP 116/70 | HR 81 | Temp 98.0°F | Ht 67.0 in | Wt 215.2 lb

## 2020-01-15 DIAGNOSIS — E538 Deficiency of other specified B group vitamins: Secondary | ICD-10-CM

## 2020-01-15 MED ORDER — CYANOCOBALAMIN 1000 MCG/ML IJ SOLN
1000.0000 ug | Freq: Once | INTRAMUSCULAR | Status: AC
  Administered 2020-01-15: 1000 ug via INTRAMUSCULAR

## 2020-01-15 NOTE — Progress Notes (Signed)
  Office: [phone]  /  Fax: [phone]    Date: January 24, 2020   Appointment Start Time: 2:30pm Duration: 26 minutes Provider: Glennie Isle, Psy.D. Type of Session: Individual Therapy  Location of Patient: Home Location of Provider: Provider's Home Type of Contact: Telepsychological Visit via MyChart Video Visit  Session Content: Grace [name] is a 59 y.o. female presenting for a follow-up appointment to address the previously established treatment goal of increasing coping skills. Today's appointment was a telepsychological visit due to COVID-19. [name] provided verbal consent for today's telepsychological appointment and she is aware she is responsible for securing confidentiality on her end of the session. Prior to proceeding with today's appointment, [name]'s physical location at the time of this appointment was obtained as well a phone number she could be reached at in the event of technical difficulties. [name] and this provider participated in today's telepsychological service.   This provider conducted a brief check-in. [name] reported, "I'm still not following the diet like I should." She explained it is too much food and she is not used to eating more than one meal a day. Nevertheless, she described an increase in eating more than she is used to eating. Positive reinforcement was provided. The previously established SMART goal for dinner was reviewed. She noted, "I guess I'm having dinner every day." She stated she continues to have challenges with breakfast and lunch. She was engaged in goal setting for lunch. The following goal was established: [name] will eat lunch congruent to her meal plan 3 out of 7 days a week between now and the next appointment with this provider. This provider and [name] also discussed the consequences of not eating regularly/not enough on well-being and metabolism. She also discussed eating too quickly, noting a plan to place her utensil down between each bite or  eating during commercials if sitting in front of the television. [name] was receptive to today's appointment as evidenced by openness to sharing, responsiveness to feedback, and willingness to work toward the established SMART goal.  Mental Status Examination:  Appearance: well groomed and appropriate hygiene  Behavior: appropriate to circumstances Mood: euthymic Affect: mood congruent Speech: normal in rate, volume, and tone Eye Contact: appropriate Psychomotor Activity: appropriate Gait: unable to assess Thought Process: linear, logical, and goal directed  Thought Content/Perception: no hallucinations, delusions, bizarre thinking or behavior reported or observed and no evidence of suicidal and homicidal ideation, plan, and intent Orientation: time, person, place, and purpose of appointment Memory/Concentration: memory, attention, language, and fund of knowledge intact  Insight/Judgment: fair  Interventions:  Conducted a brief chart review Provided empathic reflections and validation Reviewed content from the previous session Provided positive reinforcement Employed supportive psychotherapy interventions to facilitate reduced distress and to improve coping skills with identified stressors Engaged patient in goal setting  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 [name] has demonstrated progress in her goal as evidenced by increased awareness of hunger patterns.   Plan: The next appointment will be scheduled in two weeks, which will be via MyChart Video Visit. The next session will focus on working towards the established treatment goal.

## 2020-01-15 NOTE — Progress Notes (Signed)
The patient was here today for her monthly vitamin b12 injection.

## 2020-01-16 ENCOUNTER — Encounter (INDEPENDENT_AMBULATORY_CARE_PROVIDER_SITE_OTHER): Payer: Self-pay | Admitting: Family Medicine

## 2020-01-16 ENCOUNTER — Ambulatory Visit (INDEPENDENT_AMBULATORY_CARE_PROVIDER_SITE_OTHER): Payer: Medicare Other | Admitting: Family Medicine

## 2020-01-16 VITALS — BP 117/79 | HR 80 | Temp 98.1°F | Ht 67.0 in | Wt 211.0 lb

## 2020-01-16 DIAGNOSIS — E538 Deficiency of other specified B group vitamins: Secondary | ICD-10-CM | POA: Diagnosis not present

## 2020-01-16 DIAGNOSIS — E669 Obesity, unspecified: Secondary | ICD-10-CM | POA: Diagnosis not present

## 2020-01-16 DIAGNOSIS — R7303 Prediabetes: Secondary | ICD-10-CM

## 2020-01-16 DIAGNOSIS — Z9189 Other specified personal risk factors, not elsewhere classified: Secondary | ICD-10-CM

## 2020-01-16 DIAGNOSIS — Z6833 Body mass index (BMI) 33.0-33.9, adult: Secondary | ICD-10-CM

## 2020-01-16 DIAGNOSIS — E559 Vitamin D deficiency, unspecified: Secondary | ICD-10-CM

## 2020-01-16 LAB — VITAMIN B12: Vitamin B-12: 519 pg/mL (ref 232–1245)

## 2020-01-16 MED ORDER — METFORMIN HCL 500 MG PO TABS: ORAL_TABLET | ORAL | 0 refills | Status: DC

## 2020-01-17 NOTE — Progress Notes (Signed)
Chief Complaint:   OBESITY Grace [name] is here to discuss her progress with her obesity treatment plan along with follow-up of her obesity related diagnoses. Grace [name] is on the Category 1 Plan and states she is following her eating plan approximately 25% of the time. Grace [name] states she is walking for 15-30 minutes 3 times per week.  Today's visit was #: 4 Starting weight: 221 lbs Starting date: 11/22/2019 Today's weight: 211 lbs Today's date: 01/16/2020 Total lbs lost to date: 10 lbs Total lbs lost since last in-office visit: 4 lbs  Interim History: Grace [name] says, "I know I can eat better".  She went from 1 meal per day to 2 meals per day.  She states it is a very strong habit to eat 1 meal per day.  She says it is difficult to get in proteins.  Subjective:   1. Vitamin D deficiency Grace [name]'s Vitamin D level was 35.5 on 10/10/2019. She is currently taking prescription vitamin D 50,000 IU each week. She denies nausea, vomiting or muscle weakness.  In June, the level was 35.5.  Management per PCP.  2. Vitamin B 12 deficiency She is not a vegetarian.  She does not have a previous diagnosis of pernicious anemia.  She does not have a history of weight loss surgery.  She is on monthly B12 now via her PCP.  Lab Results  Component Value Date   [id number] 300 01/15/2020   3. Prediabetes Grace [name] has a diagnosis of prediabetes based on her elevated HgA1c and was informed this puts her at greater risk of developing diabetes. She continues to work on diet and exercise to decrease her risk of diabetes. She denies nausea or hypoglycemia.  She has never been on a medication for this.  She has multiple allergies, but wants to try a low dose after discussion of risks and benefits of medication.  Lab Results  Component Value Date   HGBA1C 5.9 (H) 11/22/2019   Lab Results  Component Value Date   INSULIN 29.8 (H) 10/10/2019   4. At risk for diabetes mellitus - Grace [name] was given extensive diabetes prevention  education and counseling today of more than 10 minutes.  - Counseled patient on pathophysiology of disease and discussed various treatment options which always includes dietary and lifestyle modification as first line.   - Importance of healthy diet with very limited amounts of simple carbohydrates discussed with patient in addition to regular aerobic exercise of 88min 5d/week or more.  - Handouts provided at patient's desire and or told to go online at the American Diabetes Association website for further information  Assessment/Plan:   1. Vitamin D deficiency Low Vitamin D level contributes to fatigue and are associated with obesity, breast, and colon cancer. She agrees to continue to take prescription Vitamin D @50 ,000 IU twice a week and will follow-up for routine testing of Vitamin D, at least 2-3 times per year to avoid over-replacement.  Continue vitamin D twice weekly along with 1,000 IU OTC vitamin D.  Her PCP is following and she says they have never been able to get it up within normal limits to goal >50.  2. Vitamin B 12 deficiency The diagnosis was reviewed with the patient. Counseling provided today, see below. We will continue to monitor. Orders and follow up as documented in patient record.  Recheck.  Level was within normal limits.  She will continue with teatment per PCP.  We will recheck alongside PCP.  Continue nutritious meal plan.  Counseling .  The body needs vitamin B12: to make red blood cells; to make DNA; and to help the nerves work properly so they can carry messages from the brain to the body.  . The main causes of vitamin B12 deficiency include dietary deficiency, digestive diseases, pernicious anemia, and having a surgery in which part of the stomach or small intestine is removed.  . Certain medicines can make it harder for the body to absorb vitamin B12. These medicines include: heartburn medications; some antibiotics; some medications used to treat diabetes, gout, and  high cholesterol.  . In some cases, there are no symptoms of this condition. If the condition leads to anemia or nerve damage, various symptoms can occur, such as weakness or fatigue, shortness of breath, and numbness or tingling in your hands and feet.   . Treatment:  o May include taking vitamin B12 supplements.  o Avoid alcohol.  o Eat lots of healthy foods that contain vitamin B12: - Beef, pork, chicken, [place], and organ meats, such as liver.  - Seafood: This includes clams, rainbow trout, salmon, tuna, and haddock. Eggs.  - Cereal and dairy products that are fortified: This means that vitamin B12 has been added to the food.   3. Prediabetes Grace [name] will continue to work on weight loss, exercise, and decreasing simple carbohydrates to help decrease the risk of diabetes.  Start metformin 250 mg daily with lunch.  Check A1c 02/22/2020 and BMP around then.  Gave patient handout on (metformin) and prediabetes.  -Start metFORMIN (GLUCOPHAGE) 500 MG tablet; 1/2 tab daily with lunch  Dispense: 30 tablet; Refill: 0  4. At risk for diabetes mellitus - Grace [name] was given extensive diabetes prevention education and counseling today of more than 7.5 minutes.  - Counseled patient on pathophysiology of disease and discussed various treatment options which always includes dietary and lifestyle modification as first line.   - Importance of healthy diet with very limited amounts of simple carbohydrates discussed with patient in addition to regular aerobic exercise of 87min 5d/week or more.  - Handouts provided at patient's desire and or told to go online at the American Diabetes Association website for further information  5. Class 1 obesity with serious comorbidity and body mass index (BMI) of 33.0 to 33.9 in adult, unspecified obesity type Grace [name] is currently in the action stage of change. As such, her goal is to continue with weight loss efforts. She has agreed to the Category 1 Plan.   Exercise goals: For  substantial health benefits, adults should do at least 150 minutes (2 hours and 30 minutes) a week of moderate-intensity, or 75 minutes (1 hour and 15 minutes) a week of vigorous-intensity aerobic physical activity, or an equivalent combination of moderate- and vigorous-intensity aerobic activity. Aerobic activity should be performed in episodes of at least 10 minutes, and preferably, it should be spread throughout the week.  Behavioral modification strategies: increasing lean protein intake, decreasing simple carbohydrates, no skipping meals, meal planning and cooking strategies and planning for success.  Grace [name] has agreed to follow-up with our clinic in 2 weeks. She was informed of the importance of frequent follow-up visits to maximize her success with intensive lifestyle modifications for her multiple health conditions.   Objective:   Blood pressure 117/79, pulse 80, temperature 98.1 F (36.7 C), height 5\' 7"  (1.702 m), weight 211 lb (95.7 kg), last menstrual period 04/09/2013, SpO2 95 %. Body mass index is 33.05 kg/m.  General: Cooperative, alert, well developed, in no acute distress. HEENT: Conjunctivae and  lids unremarkable. Cardiovascular: Regular rhythm.  Lungs: Normal work of breathing. Neurologic: No focal deficits.   Lab Results  Component Value Date   CREATININE 0.66 10/10/2019   BUN 12 10/10/2019   NA 143 10/10/2019   K 4.3 10/10/2019   CL 106 10/10/2019   CO2 21 10/10/2019   Lab Results  Component Value Date   ALT 31 10/10/2019   AST 32 10/10/2019   ALKPHOS 104 10/10/2019   BILITOT 0.6 10/10/2019   Lab Results  Component Value Date   HGBA1C 5.9 (H) 11/22/2019   Lab Results  Component Value Date   INSULIN 29.8 (H) 10/10/2019   Lab Results  Component Value Date   TSH 1.500 11/22/2019   Lab Results  Component Value Date   CHOL 205 (H) 11/22/2019   HDL 108 11/22/2019   LDLCALC 71 11/22/2019   TRIG 161 (H) 11/22/2019   CHOLHDL 1.9 11/22/2019   Lab  Results  Component Value Date   WBC 10.3 11/22/2019   HGB 13.7 11/22/2019   HCT 40.3 11/22/2019   MCV 93 11/22/2019   PLT 275 11/22/2019   Attestation Statements:   Reviewed by clinician on day of visit: allergies, medications, problem list, medical history, surgical history, family history, social history, and previous encounter notes.  I, Water quality scientist, CMA, am acting as Location manager for [employer], DO.  I have reviewed the above documentation for accuracy and completeness, and I agree with the above. Mellody Dance, DO

## 2020-01-21 ENCOUNTER — Other Ambulatory Visit: Payer: Self-pay | Admitting: Internal Medicine

## 2020-01-24 ENCOUNTER — Telehealth (INDEPENDENT_AMBULATORY_CARE_PROVIDER_SITE_OTHER): Payer: Medicare Other | Admitting: Psychology

## 2020-01-24 ENCOUNTER — Other Ambulatory Visit: Payer: Self-pay | Admitting: *Deleted

## 2020-01-24 DIAGNOSIS — F3289 Other specified depressive episodes: Secondary | ICD-10-CM | POA: Diagnosis not present

## 2020-01-24 MED ORDER — HYDROCODONE-ACETAMINOPHEN 5-325 MG PO TABS: 1.0000 | ORAL_TABLET | Freq: Three times a day (TID) | ORAL | 0 refills | Status: DC | PRN

## 2020-01-24 NOTE — Progress Notes (Signed)
  Office: [phone]  /  Fax: [phone]    Date: February 07, 2020   Appointment Start Time: 2:18pm Duration: 32 minutes Provider: Glennie Isle, Psy.D. Type of Session: Individual Therapy  Location of Patient: Home Location of Provider: Provider's Home Type of Contact: Telepsychological Visit via MyChart Video Visit  Session Content: Grace [name] is a 59 y.o. female presenting for a follow-up appointment to address the previously established treatment goal of increasing coping skills. Today's appointment was a telepsychological visit due to COVID-19. Grace [name] provided verbal consent for today's telepsychological appointment and she is aware she is responsible for securing confidentiality on her end of the session. Prior to proceeding with today's appointment, Grace [name]'s physical location at the time of this appointment was obtained as well a phone number she could be reached at in the event of technical difficulties. Grace [name] and this provider participated in today's telepsychological service.   This provider conducted a brief check-in. Grace [name] shared her neighbor's son passed away. Associated thoughts and feelings were briefly processed. Regarding eating, Grace [name] discussed making better choices and engaging in portion control. She stated she is also trying to eat more regularly. Reviewed previously established SMART goal for lunch. She noted she reached her goal and believes she exceeded her goal. Positive reinforcement was provided. Grace [name] discussed losing an additional pound but described feeling disappointed. Thus, today's appointment focused on other factors to measure success (e.g., improved eating habits, changes in labs, improvement in mood, change in metabolism, changes in how clothes fit) not just the number on the scale. She was encouraged to write down the aforementioned for future reference; she agreed. Notably, this provider discussed her upcoming maternity leave toward the end of November. Grace [name]  acknowledged understanding given the uncertain nature of the circumstances, this provider may be out of the office sooner. All questions/concerns were addressed. Grace [name] denied any concerns. Overall, Grace [name] was receptive to today's appointment as evidenced by openness to sharing and responsiveness to feedback,  Mental Status Examination:  Appearance: well groomed and appropriate hygiene  Behavior: appropriate to circumstances Mood: euthymic Affect: mood congruent Speech: normal in rate, volume, and tone Eye Contact: appropriate Psychomotor Activity: appropriate Gait: unable to assess Thought Process: linear, logical, and goal directed  Thought Content/Perception: denies suicidal and homicidal ideation, plan, and intent, no hallucinations, delusions, bizarre thinking or behavior reported or observed and denies ideation and engagement in self-injurious behaviors Orientation: time, person, place, and purpose of appointment Memory/Concentration: memory, attention, language, and fund of knowledge intact  Insight/Judgment: fair  Interventions:  Conducted a brief chart review Conducted a risk assessment Provided empathic reflections and validation Reviewed content from the previous session Provided positive reinforcement Employed supportive psychotherapy interventions to facilitate reduced distress and to improve coping skills with identified stressors  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 Grace [name] has demonstrated progress in her goal as evidenced by increased awareness of hunger patterns. Grace [name] also continues to demonstrate willingness to engage in learned skill(s).  Plan: The next appointment will be scheduled in two weeks, which will be via MyChart Video Visit. The next session will focus on  working towards the established treatment goal.

## 2020-01-27 ENCOUNTER — Encounter (INDEPENDENT_AMBULATORY_CARE_PROVIDER_SITE_OTHER): Payer: Self-pay | Admitting: Family Medicine

## 2020-01-28 ENCOUNTER — Encounter: Payer: Self-pay | Admitting: Internal Medicine

## 2020-01-28 ENCOUNTER — Telehealth (INDEPENDENT_AMBULATORY_CARE_PROVIDER_SITE_OTHER): Payer: Medicare Other | Admitting: Family Medicine

## 2020-01-28 NOTE — Telephone Encounter (Signed)
Please review and advise. Appt today at 11:20am.

## 2020-01-31 ENCOUNTER — Encounter: Payer: Self-pay | Admitting: Nurse Practitioner

## 2020-01-31 ENCOUNTER — Other Ambulatory Visit: Payer: Self-pay

## 2020-01-31 ENCOUNTER — Encounter (INDEPENDENT_AMBULATORY_CARE_PROVIDER_SITE_OTHER): Payer: Self-pay

## 2020-01-31 ENCOUNTER — Ambulatory Visit (INDEPENDENT_AMBULATORY_CARE_PROVIDER_SITE_OTHER): Payer: Medicare Other | Admitting: Nurse Practitioner

## 2020-01-31 VITALS — BP 124/80 | HR 79 | Temp 97.7°F | Ht 66.0 in | Wt 213.8 lb

## 2020-01-31 DIAGNOSIS — B354 Tinea corporis: Secondary | ICD-10-CM | POA: Diagnosis not present

## 2020-01-31 MED ORDER — NYSTATIN-TRIAMCINOLONE 100000-0.1 UNIT/GM-% EX OINT: 1.0000 "application " | TOPICAL_OINTMENT | Freq: Two times a day (BID) | CUTANEOUS | 2 refills | Status: DC

## 2020-01-31 NOTE — Progress Notes (Signed)
I,Yamilka Roman [employer] as a Education administrator for [employer], FNP.,have documented all relevant documentation on the behalf of Minette Brine, FNP,as directed by  Minette Brine, FNP while in the presence of Minette Brine, [place]. This visit occurred during the SARS-CoV-2 public health emergency.  Safety protocols were in place, including screening questions prior to the visit, additional usage of staff PPE, and extensive cleaning of exam room while observing appropriate contact time as indicated for disinfecting solutions.  Subjective:     Patient ID: Grace [name] , female    DOB: 1960-12-05 , 59 y.o.   MRN: [id number]   Chief Complaint  Patient presents with  . Rash    patient stated she has a rash in her groin area that she noticed on wednesday. she stated the area is itchy     HPI  She was having an allergic reaction to her eye drops by Dr. Edilia Bo so those were changed yesterday  The Tuesday before she had itching and noticed a bulls eye to the area.  She has used nystatin and lotrimin cream She has not received a covid vaccine her Neurologist has encouraged her to wait a bit.   Rash This is a new problem. Episode onset: saturday.     Past Medical History:  Diagnosis Date  . Allergy   . Anxiety   . Avascular necrosis ([place])   . Back pain   . Cancer ([place])   . Chest pain   . Chewing difficulty   . Chronic fatigue syndrome   . Colon polyp    pre-cancerous  . Complication of anesthesia    patient states she requires more anesthesia  . Depression   . Disorder of soft tissue   . Diverticulosis   . Dizziness   . Edema, lower extremity   . Fatty liver   . Fibromyalgia   . GERD (gastroesophageal reflux disease)   . H/O breast biopsy    pre- cancerous cells  . Hand fracture, left    9/87  . Heart palpitations   . Heart valve disease   . History of Holter monitoring   . Hyperlipidemia   . Insomnia   . Joint pain   . Lactose intolerance   . Lhermitte's syndrome   .  Migraines   . Multiple food allergies   . Multiple sclerosis ([place])    dx in 11/1996  . Multiple sclerosis ([place])   . Neuromuscular disorder ([place])   . Neuropathy   . Numbness and tingling in hands   . Obesity   . Optic neuritis   . OSA (obstructive sleep apnea)    no longer have OSA since stopped taking a MS medication  . Osteoarthritis   . Otitis media   . Palpitations   . Prediabetes   . Raynaud's phenomenon   . Shortness of breath   . Sleep apnea    Due to medication that she no longer takes.  . Swallowing difficulty   . Vaginitis and vulvovaginitis   . Vitamin B12 deficiency   . Vitamin D deficiency      Family History  Problem Relation Age of Onset  . Pancreatic cancer Mother   . Stroke Mother   . Hypertension Mother   . Hyperlipidemia Mother   . Heart disease Mother   . Cancer Mother   . Depression Mother   . Anxiety disorder Mother   . Alcoholism Mother   . Testicular cancer Brother   . Colon cancer Maternal Uncle   .  Pancreatic cancer Paternal Uncle   . Prostate cancer Maternal Grandfather   . Colon polyps Father   . Diabetes Father   . Hypertension Father   . Hyperlipidemia Father   . Kidney disease Father   . Sleep apnea Father   . Obesity Father      Current Outpatient Medications:  .  Ascorbic Acid (VITAMIN C) 1000 MG tablet, Take 1,000 mg by mouth daily., Disp: , Rfl:  .  aspirin EC 81 MG tablet, Take 81 mg by mouth daily., Disp: , Rfl:  .  baclofen (LIORESAL) 10 MG tablet, TAKE 2 TABLETS (20 MG TOTAL) BY MOUTH 3 (THREE) TIMES DAILY., Disp: 180 tablet, Rfl: 11 .  Cholecalciferol (VITAMIN D) 50 MCG (2000 UT) CAPS, Take 2,000 Units by mouth daily. , Disp: , Rfl:  .  CINNAMON PO, Take 1,200 mg by mouth daily. , Disp: , Rfl:  .  diphenhydrAMINE (BENADRYL) 25 MG tablet, Take 25 mg by mouth 3 (three) times daily. Takes with Hydrocodone, Disp: , Rfl:  .  doxepin (SINEQUAN) 10 MG capsule, TAKE 1 TO 2 CAPSULES AT BEDTIME, Disp: 180 capsule, Rfl: 3 .   ELDERBERRY PO, Take by mouth daily., Disp: , Rfl:  .  ergocalciferol (VITAMIN D2) 1.25 MG (50000 UT) capsule, Take 50,000 Units by mouth 2 (two) times a week., Disp: , Rfl:  .  etodolac (LODINE) 500 MG tablet, Take 500 mg by mouth. 1-2 times a day, Disp: , Rfl:  .  Evening Primrose Oil 1000 MG CAPS, 1300mg  daily, Disp: , Rfl:  .  famotidine (PEPCID) 20 MG tablet, Take 1 tablet (20 mg total) by mouth 2 (two) times daily. (Patient taking differently: Take 20 mg by mouth daily. ), Disp: 180 tablet, Rfl: 1 .  Flaxseed, Linseed, (FLAX SEED OIL) 1000 MG CAPS, Take 1,200 mg by mouth daily. , Disp: , Rfl:  .  gabapentin (NEURONTIN) 300 MG capsule, Take 1 capsule (300 mg total) by mouth 3 (three) times daily. (Patient taking differently: Take 300 mg by mouth 3 (three) times daily. Taking as needed), Disp: 90 capsule, Rfl: 11 .  Grape Seed 50 MG CAPS, Take 2 capsules by mouth daily. , Disp: , Rfl:  .  HYDROcodone-acetaminophen (NORCO/VICODIN) 5-325 MG tablet, Take 1 tablet by mouth 3 (three) times daily as needed for moderate pain., Disp: 90 tablet, Rfl: 0 .  ipratropium (ATROVENT) 0.06 % nasal spray, Place 2 sprays into both nostrils 2 (two) times daily., Disp: , Rfl:  .  L-Lysine 1000 MG TABS, Take by mouth. As needed, Disp: , Rfl:  .  Lactobacillus (PROBIOTIC ACIDOPHILUS PO), Take by mouth daily. [employer]vantage, Disp: , Rfl:  .  levocetirizine (XYZAL) 5 MG tablet, TAKE 1 TABLET BY MOUTH EVERY DAY IN THE EVENING, Disp: 90 tablet, Rfl: 2 .  loratadine-pseudoephedrine (QC LORATADINE-D) 10-240 MG 24 hr tablet, Take 1 tablet by mouth daily., Disp: 90 tablet, Rfl: 2 .  LORazepam (ATIVAN) 1 MG tablet, Take 1 tablet (1 mg total) by mouth at bedtime., Disp: 30 tablet, Rfl: 5 .  Lutein-Bilberry (BILBERRY PLUS LUTEIN PO), Take by mouth daily., Disp: , Rfl:  .  Melatonin 10 MG TABS, Take 1 tablet by mouth at bedtime. , Disp: , Rfl:  .  metFORMIN (GLUCOPHAGE) 500 MG tablet, 1/2 tab daily with lunch, Disp: 30  tablet, Rfl: 0 .  Moringa 500 MG CAPS, Take 2 capsules by mouth daily., Disp: , Rfl:  .  Multiple Vitamins-Minerals (HAIR/SKIN/NAILS/BIOTIN) TABS, Take by mouth daily. ,  Disp: , Rfl:  .  Netarsudil-Latanoprost (ROCKLATAN) 0.02-0.005 % SOLN, Place 1 drop into the right eye at bedtime., Disp: , Rfl:  .  rizatriptan (MAXALT-MLT) 10 MG disintegrating tablet, Take 1 tablet (10 mg total) by mouth as needed for migraine. May repeat in 2 hours if needed.  Max of 2 tablets in 24 hours, and 10 tablets per month, Disp: 10 tablet, Rfl: 11 .  sertraline (ZOLOFT) 50 MG tablet, TAKE 1 TABLET BY MOUTH EVERY DAY (Patient taking differently: 1/2 pill daily), Disp: 90 tablet, Rfl: 2 .  timolol (BETIMOL) 0.5 % ophthalmic solution, Place 1 drop into the right eye 2 (two) times daily., Disp: , Rfl:  .  tiZANidine (ZANAFLEX) 2 MG tablet, Take up to 4 pills a day, Disp: 120 tablet, Rfl: 11 .  vitamin E 1000 UNIT capsule, Take 1,000 Units by mouth daily. , Disp: , Rfl:  .  zolpidem (AMBIEN) 10 MG tablet, Take 10 mg by mouth at bedtime as needed. For sleep, Disp: , Rfl:  .  nystatin-triamcinolone ointment (MYCOLOG), Apply 1 application topically 2 (two) times daily., Disp: 30 g, Rfl: 2   Allergies  Allergen Reactions  . Lamictal [Lamotrigine] Shortness Of Breath, Itching and Swelling    Tongue swells  . Mobic [Meloxicam] Anaphylaxis  . Ultram [Tramadol Hcl] Anaphylaxis  . Codeine Itching  . Diclofenac Itching  . Latex     Latex band-aids cause redness and tears your skin  . Nitrofurantoin     Causing itching all over, tongue swelling, chest heaviness, swollen lips, cough, rapid HR, swollen eyelids, red eyes/gums, lips red/white per pt.  . Teriflunomide Diarrhea    Hair loss, joint pains, elevated liver enzymes abugio (joint pains and liver issues)  . Trazodone And Nefazodone     Mouth sores, tongue swelling  . Tape Rash     Review of Systems  Skin: Positive for rash.     Today's Vitals   01/31/20 1616   BP: 124/80  Pulse: 79  Temp: 97.7 F (36.5 C)  TempSrc: Oral  Weight: 213 lb 12.8 oz (97 kg)  Height: 5\' 6"  (1.676 m)   Body mass index is 34.51 kg/m.   Objective:  Physical Exam Constitutional:      General: She is not in acute distress.    Appearance: Normal appearance.  Skin:    Capillary Refill: Capillary refill takes less than 2 seconds.     Findings: Rash (red circular rash to left groin on upper and lower groin area, dry and flat) present.  Neurological:     General: No focal deficit present.     Mental Status: She is alert and oriented to person, place, and time.     Cranial Nerves: No cranial nerve deficit.  Psychiatric:        Mood and Affect: Mood normal.        Behavior: Behavior normal.        Thought Content: Thought content normal.        Judgment: Judgment normal.         Assessment And Plan:     1. Tinea corporis  Left groin area with erythematous area upper and lower, dry  If not better by next Thursday return call to office. - nystatin-triamcinolone ointment ([place]); Apply 1 application topically 2 (two) times daily.  Dispense: 30 g; Refill: 2    Patient was given opportunity to ask questions. Patient verbalized understanding of the plan and was able to repeat key elements of  the plan. All questions were answered to their satisfaction.   Teola Bradley, FNP, have reviewed all documentation for this visit. The documentation on 01/31/20 for the exam, diagnosis, procedures, and orders are all accurate and complete.   THE PATIENT IS ENCOURAGED TO PRACTICE SOCIAL DISTANCING DUE TO THE COVID-19 PANDEMIC.

## 2020-01-31 NOTE — Patient Instructions (Addendum)
Visit Information  Goals Addressed            This Visit's Progress   . Pharmacy Care Plan       CARE PLAN ENTRY (see longitudinal plan of care for additional care plan information)  Current Barriers:  . Chronic Disease Management support, education, and care coordination needs related to Depression, Anxiety, and Vitamin D deficiency, Multiple Sclerosis, and prediabetes   Depression with Anxiety . Pharmacist Clinical Goal(s): o Over the next 90 days, patient will work with PharmD and providers to improve/control symptoms of anxiety and depression . Current regimen:  o Sertraline 50mg  1/2 tablet daily o Lorazepam 1mg  at bedtime . Interventions: o Discussed importance of not stopping sertraline suddenly even if patient feels she does not need it - Patient to discuss with doctor and taper off slowly if approved o Discussed timing of sertraline administration (nighttime dosing improves adherence) - Sleep problems started long before patient started taking sertraline . Patient self care activities - Over the next 90 days, patient will: o Continue medications daily as directed o Speak with doctor before discontinuing any medications  Prediabetes Lab Results  Component Value Date/Time   HGBA1C 5.9 (H) 11/22/2019 05:10 PM   . Pharmacist Clinical Goal(s): o Over the next 90 days, patient will work with PharmD and providers to maintain A1c goal <6.5% . Current regimen:  o Metformin 500mg  1/2 tablet daily with lunch . Interventions: o Comprehensive medication review . Patient self care activities - Over the next 90 days, patient will: o Contact provider with any episodes of hypoglycemia o Focus on healthy diet and exercise  Multiple Sclerosis . Pharmacist Clinical Goal(s) o Over the next 180 days, patient will work with PharmD and providers to manage symptoms of multiple sclerosis . Current regimen:  o N/A . Interventions: o Discussed various treatment options patient has  tried for multiple sclerosis o Determined patient has not had worsening of symptoms in past 2 years (followed closely by Neurology) . Patient self care activities - Over the next 180 days, patient will: o Follow up with Neurologist as scheduled o Notify Neurologist of any worsening of symptoms   Vitamin D Deficiency . Pharmacist Clinical Goal(s) o Over the next 180 days, patient will work with PharmD and providers to maintain normal Vitamin D levels . Current regimen:  [name] Kitchen Vitamin D2 50,000 units twice weekly (Tuesday and Friday)  . Vitamin D3 2000 units daily . Interventions: o Determined patient has been taking both strengths of Vitamin D for about a year due to difficulty maintaining adequate Vitamin D levels o Advised patient to spend 15 minutes outside daily with adequate sun protection . Patient self care activities - Over the next 180 days, patient will: o Spend 15 minutes outside daily with sun protection o Continue Vitamin D supplements as directed  Medication management . Pharmacist Clinical Goal(s): o Over the next 180 days, patient will work with PharmD and providers to maintain optimal medication adherence . Current pharmacy: [facility] . Interventions o Comprehensive medication review performed. o Continue current medication management strategy . Patient self care activities - Over the next 90 days, patient will: o Focus on medication adherence by use of a pill box o Take medications as prescribed o Report any questions or concerns to PharmD and/or provider(s)  Initial goal documentation        Grace [name] was given information about Chronic Care Management services today including:  1. CCM service includes personalized support from designated clinical staff  supervised by her physician, including individualized plan of care and coordination with other care providers 2. 24/7 contact phone numbers for assistance for urgent and routine care needs. 3. Standard insurance,  coinsurance, copays and deductibles apply for chronic care management only during months in which we provide at least 20 minutes of these services. Most insurances cover these services at 100%, however patients may be responsible for any copay, coinsurance and/or deductible if applicable. This service may help you avoid the need for more expensive face-to-face services. 4. Only one practitioner may furnish and bill the service in a calendar month. 5. The patient may stop CCM services at any time (effective at the end of the month) by phone call to the office staff.  Patient agreed to services and verbal consent obtained.   The patient verbalized understanding of instructions provided today and agreed to receive a mailed copy of patient instruction and/or educational materials. Telephone follow up appointment with pharmacy team member scheduled for: 03/12/20 @ 3:30 PM  Jannette Fogo, PharmD Clinical Pharmacist [facility] [phone]   Prediabetes Eating Plan Prediabetes is a condition that causes blood sugar (glucose) levels to be higher than normal. This increases the risk for developing diabetes. In order to prevent diabetes from developing, your health care provider may recommend a diet and other lifestyle changes to help you:  Control your blood glucose levels.  Improve your cholesterol levels.  Manage your blood pressure. Your health care provider may recommend working with a diet and nutrition specialist (dietitian) to make a meal plan that is best for you. What are tips for following this plan? Lifestyle  Set weight loss goals with the help of your health care team. It is recommended that most people with prediabetes lose 7% of their current body weight.  Exercise for at least 30 minutes at least 5 days a week.  Attend a support group or seek ongoing support from a mental health counselor.  Take over-the-counter and prescription medicines only as told  by your health care provider. Reading food labels  Read food labels to check the amount of fat, salt (sodium), and sugar in prepackaged foods. Avoid foods that have: ? Saturated fats. ? Trans fats. ? Added sugars.  Avoid foods that have more than 300 milligrams (mg) of sodium per serving. Limit your daily sodium intake to less than 2,300 mg each day. Shopping  Avoid buying pre-made and processed foods. Cooking  Cook with olive oil. Do not use butter, lard, or ghee.  Bake, broil, grill, or boil foods. Avoid frying. Meal planning   Work with your dietitian to develop an eating plan that is right for you. This may include: ? Tracking how many calories you take in. Use a food diary, notebook, or mobile application to track what you eat at each meal. ? Using the glycemic index (GI) to plan your meals. The index tells you how quickly a food will raise your blood glucose. Choose low-GI foods. These foods take a longer time to raise blood glucose.  Consider following a Mediterranean diet. This diet includes: ? Several servings each day of fresh fruits and vegetables. ? Eating fish at least twice a week. ? Several servings each day of whole grains, beans, nuts, and seeds. ? Using olive oil instead of other fats. ? Moderate alcohol consumption. ? Eating small amounts of red meat and whole-fat dairy.  If you have high blood pressure, you may need to limit your sodium intake or follow a diet  such as the DASH eating plan. DASH is an eating plan that aims to lower high blood pressure. What foods are recommended? The items listed below may not be a complete list. Talk with your dietitian about what dietary choices are best for you. Grains Whole grains, such as whole-wheat or whole-grain breads, crackers, cereals, and pasta. Unsweetened oatmeal. Bulgur. Barley. Quinoa. Brown rice. Corn or whole-wheat flour tortillas or taco shells. Vegetables Lettuce. Spinach. Peas. Beets. Cauliflower. Cabbage.  Broccoli. Carrots. Tomatoes. Squash. Eggplant. Herbs. Peppers. Onions. Cucumbers. Brussels sprouts. Fruits Berries. Bananas. Apples. Oranges. Grapes. Papaya. Mango. Pomegranate. Kiwi. Grapefruit. Cherries. Meats and other protein foods Seafood. Poultry without skin. Lean cuts of pork and beef. Tofu. Eggs. Nuts. Beans. Dairy Low-fat or fat-free dairy products, such as yogurt, cottage cheese, and cheese. Beverages Water. Tea. Coffee. Sugar-free or diet soda. Seltzer water. Lowfat or no-fat milk. Milk alternatives, such as soy or almond milk. Fats and oils Olive oil. Canola oil. Sunflower oil. Grapeseed oil. Avocado. Walnuts. Sweets and desserts Sugar-free or low-fat pudding. Sugar-free or low-fat ice cream and other frozen treats. Seasoning and other foods Herbs. Sodium-free spices. Mustard. Relish. Low-fat, low-sugar ketchup. Low-fat, low-sugar barbecue sauce. Low-fat or fat-free mayonnaise. What foods are not recommended? The items listed below may not be a complete list. Talk with your dietitian about what dietary choices are best for you. Grains Refined white flour and flour products, such as bread, pasta, snack foods, and cereals. Vegetables Canned vegetables. Frozen vegetables with butter or cream sauce. Fruits Fruits canned with syrup. Meats and other protein foods Fatty cuts of meat. Poultry with skin. Breaded or fried meat. Processed meats. Dairy Full-fat yogurt, cheese, or milk. Beverages Sweetened drinks, such as sweet iced tea and soda. Fats and oils Butter. Lard. Ghee. Sweets and desserts Baked goods, such as cake, cupcakes, pastries, cookies, and cheesecake. Seasoning and other foods Spice mixes with added salt. Ketchup. Barbecue sauce. Mayonnaise. Summary  To prevent diabetes from developing, you may need to make diet and other lifestyle changes to help control blood sugar, improve cholesterol levels, and manage your blood pressure.  Set weight loss goals with the  help of your health care team. It is recommended that most people with prediabetes lose 7 percent of their current body weight.  Consider following a Mediterranean diet that includes plenty of fresh fruits and vegetables, whole grains, beans, nuts, seeds, fish, lean meat, low-fat dairy, and healthy oils. This information is not intended to replace advice given to you by your health care provider. Make sure you discuss any questions you have with your health care provider. Document Revised: 08/18/2018 Document Reviewed: 06/30/2016 Elsevier Patient Education  2020 [employer].

## 2020-02-03 DIAGNOSIS — H4051X4 Glaucoma secondary to other eye disorders, right eye, indeterminate stage: Secondary | ICD-10-CM | POA: Insufficient documentation

## 2020-02-03 DIAGNOSIS — H4060X0 Glaucoma secondary to drugs, unspecified eye, stage unspecified: Secondary | ICD-10-CM | POA: Insufficient documentation

## 2020-02-04 ENCOUNTER — Other Ambulatory Visit: Payer: Self-pay

## 2020-02-04 ENCOUNTER — Encounter (INDEPENDENT_AMBULATORY_CARE_PROVIDER_SITE_OTHER): Payer: Self-pay | Admitting: Family Medicine

## 2020-02-04 ENCOUNTER — Ambulatory Visit (INDEPENDENT_AMBULATORY_CARE_PROVIDER_SITE_OTHER): Payer: Medicare Other | Admitting: Family Medicine

## 2020-02-04 VITALS — BP 112/73 | HR 77 | Temp 97.3°F | Ht 67.0 in | Wt 211.0 lb

## 2020-02-04 DIAGNOSIS — E669 Obesity, unspecified: Secondary | ICD-10-CM

## 2020-02-04 DIAGNOSIS — R7303 Prediabetes: Secondary | ICD-10-CM

## 2020-02-04 DIAGNOSIS — Z6833 Body mass index (BMI) 33.0-33.9, adult: Secondary | ICD-10-CM

## 2020-02-06 ENCOUNTER — Ambulatory Visit: Payer: Medicare Other | Admitting: Psychology

## 2020-02-06 NOTE — Progress Notes (Signed)
Chief Complaint:   OBESITY Grace [name] is here to discuss her progress with her obesity treatment plan along with follow-up of her obesity related diagnoses. Grace [name] is on the Category 1 Plan and states she is following her eating plan approximately 15% of the time. Grace [name] states she is exercising for 0 minutes 0 times per week.  Today's visit was #: 5 Starting weight: 221 lbs Starting date: 11/22/2019 Today's weight: 210 lbs Today's date: 02/04/2020 Total lbs lost to date: 11 lbs Total lbs lost since last in-office visit: 1 lb  Interim History: Grace [name] was last seen on 01/16/2020.  We started her on metformin then, but she started on another new medication by her eye doctor then and she "could not start 2 new medications at once".  She had an allergic reaction to the new ophthalmic medication - rash and itching.  Since she stopped the medication, the rash has resolved.  Since she has not felt well, she has not been eating all her foods; skipping breakfast and lunch daily again, which is her old habit.  Subjective:   1. Prediabetes Grace [name] has a diagnosis of prediabetes based on her elevated HgA1c and was informed this puts her at greater risk of developing diabetes. She continues to work on diet and exercise to decrease her risk of diabetes. She denies nausea or hypoglycemia.  Still with nighttime cravings since she could not start metformin.  Also skips breakfast and lunch.  She is very prone to allergic reactions with a lot of drugs.  Lab Results  Component Value Date   HGBA1C 5.9 (H) 11/22/2019   Lab Results  Component Value Date   INSULIN 29.8 (H) 10/10/2019   Assessment/Plan:   1. Prediabetes Grace [name] will continue to work on weight loss, exercise, and decreasing simple carbohydrates to help decrease the risk of diabetes.  Increase proteins during the daytime, even if "snacking" and not eating an entire meal.  Encouraged to regularly eat throughout the day if she finds it difficult to get  all food in at one time.    Start taking your metformin we gave last OV since she did not start it since given at last office visit.  Risks and benefits of medication discussed with her.  She will take after lunch since she has increased nighttime cravings.  2. Class 1 obesity with serious comorbidity and body mass index (BMI) of 33.0 to 33.9 in adult, unspecified obesity type  Grace [name] is currently in the action stage of change. As such, her goal is to continue with weight loss efforts. She has agreed to the Category 1 Plan.   Exercise goals: As is.  Behavioral modification strategies: increasing lean protein intake, no skipping meals, ways to avoid boredom eating, ways to avoid night time snacking, better snacking choices, avoiding temptations and planning for success.  Use meditation at night when she gets urges to eat.  She will use YouTube, which I showed her options in the office today.  Grace [name] has agreed to follow-up with our clinic in 2 weeks. She was informed of the importance of frequent follow-up visits to maximize her success with intensive lifestyle modifications for her multiple health conditions.   Objective:   Blood pressure 112/73, pulse 77, temperature (!) 97.3 F (36.3 C), height 5\' 7"  (1.702 m), weight 211 lb (95.7 kg), last menstrual period 04/09/2013, SpO2 93 %. Body mass index is 33.05 kg/m.  General: Cooperative, alert, well developed, in no acute distress. HEENT: Conjunctivae and lids unremarkable.  Cardiovascular: Regular rhythm.  Lungs: Normal work of breathing. Neurologic: No focal deficits.   Lab Results  Component Value Date   CREATININE 0.66 10/10/2019   BUN 12 10/10/2019   NA 143 10/10/2019   K 4.3 10/10/2019   CL 106 10/10/2019   CO2 21 10/10/2019   Lab Results  Component Value Date   ALT 31 10/10/2019   AST 32 10/10/2019   ALKPHOS 104 10/10/2019   BILITOT 0.6 10/10/2019   Lab Results  Component Value Date   HGBA1C 5.9 (H) 11/22/2019   Lab  Results  Component Value Date   INSULIN 29.8 (H) 10/10/2019   Lab Results  Component Value Date   TSH 1.500 11/22/2019   Lab Results  Component Value Date   CHOL 205 (H) 11/22/2019   HDL 108 11/22/2019   LDLCALC 71 11/22/2019   TRIG 161 (H) 11/22/2019   CHOLHDL 1.9 11/22/2019   Lab Results  Component Value Date   WBC 10.3 11/22/2019   HGB 13.7 11/22/2019   HCT 40.3 11/22/2019   MCV 93 11/22/2019   PLT 275 11/22/2019   Attestation Statements:   Reviewed by clinician on day of visit: allergies, medications, problem list, medical history, surgical history, family history, social history, and previous encounter notes.  Time spent on visit including pre-visit chart review and post-visit care and charting was 30 minutes.   I, Water quality scientist, CMA, am acting as Location manager for [employer], DO.  I have reviewed the above documentation for accuracy and completeness, and I agree with the above. Mellody Dance, DO

## 2020-02-07 ENCOUNTER — Telehealth (INDEPENDENT_AMBULATORY_CARE_PROVIDER_SITE_OTHER): Payer: Medicare Other | Admitting: Psychology

## 2020-02-07 DIAGNOSIS — F3289 Other specified depressive episodes: Secondary | ICD-10-CM | POA: Diagnosis not present

## 2020-02-07 NOTE — Progress Notes (Signed)
  Office: [phone]  /  Fax: [phone]    Date: February 21, 2020   Appointment Start Time: 2:31pm Duration: 33 minutes Provider: Glennie Isle, Psy.D. Type of Session: Individual Therapy  Location of Patient: Home Location of Provider: Provider's Home Type of Contact: Telepsychological Visit via MyChart Video Visit  Session Content: Grace [name] is a 59 y.o. female presenting for a follow-up appointment to address the previously established treatment goal of increasing coping skills. Today's appointment was a telepsychological visit due to COVID-19. Wave provided verbal consent for today's telepsychological appointment and she is aware she is responsible for securing confidentiality on her end of the session. Prior to proceeding with today's appointment, Nadalie's physical location at the time of this appointment was obtained as well a phone number she could be reached at in the event of technical difficulties. Ndidi and this provider participated in today's telepsychological service.   This provider conducted a brief check-in. Tymira shared updates regarding her eyes/recent appointments. Associated thoughts and feelings were briefly processed. Regarding eating, she stated she continues to lose weight, but feels disappointment as she feels she should be losing more weight. This was further explored. Psychoeducation regarding healthy weight loss was provided. She was also engaged in thought challenging/reframing to assist with coping. Reviewed all or nothing thinking. Kenyon reflected that focusing on "de-stressing" will likely help her further with the aforementioned. Zoriah was receptive to today's appointment as evidenced by openness to sharing and responsiveness to feedback.  Mental Status Examination:  Appearance: well groomed and appropriate hygiene  Behavior: appropriate to circumstances Mood: euthymic Affect: mood congruent Speech: normal in rate, volume, and tone Eye Contact:  appropriate Psychomotor Activity: appropriate Gait: unable to assess Thought Process: linear, logical, and goal directed  Thought Content/Perception: no hallucinations, delusions, bizarre thinking or behavior reported or observed and no evidence of suicidal and homicidal ideation, plan, and intent Orientation: time, person, place, and purpose of appointment Memory/Concentration: memory, attention, language, and fund of knowledge intact  Insight/Judgment: fair  Interventions:  Conducted a brief chart review Provided empathic reflections and validation Employed supportive psychotherapy interventions to facilitate reduced distress and to improve coping skills with identified stressors Employed insight oriented and cognitive psychotherapy interventions to identify and modify anxiety/mood producing thoughts, beliefs, and negative self-appraisals contributing to distress and emotional eating  Provided psychoeducation regarding healthy weight loss   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 Chealsey has demonstrated progress in her goal as evidenced by increased awareness of hunger patterns. Rayni also continues to demonstrate willingness to engage in learned skill(s).  Plan: The next appointment will be scheduled in two weeks, which will be via MyChart Video Visit. The next session will focus on working towards the established treatment goal.

## 2020-02-12 ENCOUNTER — Encounter: Payer: Self-pay | Admitting: Internal Medicine

## 2020-02-12 ENCOUNTER — Other Ambulatory Visit: Payer: Self-pay

## 2020-02-12 ENCOUNTER — Ambulatory Visit (INDEPENDENT_AMBULATORY_CARE_PROVIDER_SITE_OTHER): Payer: Medicare Other

## 2020-02-12 VITALS — BP 128/70 | HR 74 | Temp 98.0°F | Ht 67.0 in | Wt 212.0 lb

## 2020-02-12 DIAGNOSIS — E538 Deficiency of other specified B group vitamins: Secondary | ICD-10-CM

## 2020-02-12 MED ORDER — CYANOCOBALAMIN 1000 MCG/ML IJ SOLN
1000.0000 ug | Freq: Once | INTRAMUSCULAR | Status: AC
  Administered 2020-02-12: 1000 ug via INTRAMUSCULAR

## 2020-02-12 NOTE — Progress Notes (Signed)
Patient is here for b12 injection.  

## 2020-02-18 ENCOUNTER — Ambulatory Visit (INDEPENDENT_AMBULATORY_CARE_PROVIDER_SITE_OTHER): Payer: Medicare Other | Admitting: Adult Health

## 2020-02-18 ENCOUNTER — Other Ambulatory Visit: Payer: Self-pay

## 2020-02-18 ENCOUNTER — Encounter (INDEPENDENT_AMBULATORY_CARE_PROVIDER_SITE_OTHER): Payer: Self-pay | Admitting: Adult Health

## 2020-02-18 VITALS — BP 127/82 | HR 87 | Temp 98.4°F | Ht 67.0 in | Wt 208.0 lb

## 2020-02-18 DIAGNOSIS — E669 Obesity, unspecified: Secondary | ICD-10-CM

## 2020-02-18 DIAGNOSIS — R7303 Prediabetes: Secondary | ICD-10-CM

## 2020-02-18 DIAGNOSIS — Z6832 Body mass index (BMI) 32.0-32.9, adult: Secondary | ICD-10-CM

## 2020-02-18 DIAGNOSIS — E559 Vitamin D deficiency, unspecified: Secondary | ICD-10-CM

## 2020-02-18 NOTE — Progress Notes (Signed)
Chief Complaint:   OBESITY Grace [name] is here to discuss her progress with her obesity treatment plan along with follow-up of her obesity related diagnoses. Grace [name] is on the Category 1 Plan and states she is following her eating plan approximately 50% of the time. Grace [name] states she is exercising 0 minutes 0 times per week.  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  She has been slowly increasing daily food intake and is now consuming 2 meals and 1 snack daily - great!  Of note, she received a call from her ophthalmologist during her office visit and needed to leave quickly to have OD eye pressure checked prior to scheduled procedure tomorrow.  Subjective:   Prediabetes. Grace [name] has a diagnosis of prediabetes based on her elevated HgA1c and was informed this puts her at greater risk of developing diabetes. She continues to work on diet and exercise to decrease her risk of diabetes. She denies nausea or hypoglycemia. 11/22/2019 A1c 5.9. Grace [name] is on metformin 500 mg 1/2 tablet with lunch, which she is tolerating well.  Lab Results  Component Value Date   HGBA1C 5.9 (H) 11/22/2019   Lab Results  Component Value Date   INSULIN 29.8 (H) 10/10/2019   Vitamin D deficiency. 10/10/2019 Vitamin D level was 35.5, which is below goal. Grace [name] is on OTC Vitamin D 2,000 IU daily.   Ref. Range 10/10/2019 15:04  Vitamin D, 25-Hydroxy Latest Ref Range: 30.0 - 100.0 ng/mL 35.5   Assessment/Plan:   Prediabetes. Grace [name] will continue to work on weight loss, exercise, and decreasing simple carbohydrates to help decrease the risk of diabetes. She will continue  metformin as directed and increase her daily food intake.  Vitamin D deficiency. Low Vitamin D level contributes to fatigue and are associated with obesity, breast, and colon cancer. She agrees to continue to take OTC Vitamin D supplementation as directed. She will follow-up for routine testing of Vitamin D every 3 months.   Class 1 obesity with serious comorbidity and body mass index (BMI) of 32.0 to 32.9 in adult, unspecified obesity type.  Grace [name] is currently in the action stage of change. As such, her goal is to continue with weight loss efforts. She has agreed to the Category 1 Plan.    Handout was provided on Prediabetes/Insulin Resistance.  Exercise goals: No exercise has been prescribed at this time.  Behavioral modification strategies: increasing lean protein intake, no skipping meals, meal planning and cooking strategies, better snacking choices and planning for success.  Grace [name] has agreed to follow-up with our clinic in 2 weeks. She was informed of the importance of frequent follow-up visits to maximize her success with intensive lifestyle modifications for her multiple health conditions.   Objective:   Blood pressure 127/82, pulse 87, temperature 98.4 F (36.9 C), height 5\' 7"  (1.702 m), weight 208 lb (94.3 kg), last menstrual period 04/09/2013, SpO2 97 %. Body mass index is 32.58 kg/m.  General: Cooperative, alert, well developed, in no acute distress. HEENT: Conjunctivae and lids unremarkable. Cardiovascular: Regular rhythm.  Lungs: Normal work of breathing. Neurologic: No focal deficits.   Lab Results  Component Value Date   CREATININE 0.66 10/10/2019  BUN 12 10/10/2019   NA 143 10/10/2019   K 4.3 10/10/2019   CL 106 10/10/2019   CO2 21 10/10/2019   Lab Results  Component Value Date   ALT 31 10/10/2019   AST 32 10/10/2019   ALKPHOS 104 10/10/2019   BILITOT 0.6 10/10/2019   Lab Results  Component Value Date   HGBA1C 5.9 (H) 11/22/2019   Lab Results   Component Value Date   INSULIN 29.8 (H) 10/10/2019   Lab Results  Component Value Date   TSH 1.500 11/22/2019   Lab Results  Component Value Date   CHOL 205 (H) 11/22/2019   HDL 108 11/22/2019   LDLCALC 71 11/22/2019   TRIG 161 (H) 11/22/2019   CHOLHDL 1.9 11/22/2019   Lab Results  Component Value Date   WBC 10.3 11/22/2019   HGB 13.7 11/22/2019   HCT 40.3 11/22/2019   MCV 93 11/22/2019   PLT 275 11/22/2019   No results found for: IRON, TIBC, FERRITIN  Attestation Statements:   Reviewed by clinician on day of visit: allergies, medications, problem list, medical history, surgical history, family history, social history, and previous encounter notes.  Time spent on visit including pre-visit chart review and post-visit charting and care was 22 minutes.   I, Michaelene Song, am acting as Location manager for [employer], NP-C   I have reviewed the above documentation for accuracy and completeness, and I agree with the above. -  Serah Nicoletti d. Kenderick Kobler, NP-C

## 2020-02-19 ENCOUNTER — Ambulatory Visit (INDEPENDENT_AMBULATORY_CARE_PROVIDER_SITE_OTHER): Payer: Medicare Other | Admitting: Psychology

## 2020-02-19 DIAGNOSIS — F064 Anxiety disorder due to known physiological condition: Secondary | ICD-10-CM

## 2020-02-20 ENCOUNTER — Ambulatory Visit (INDEPENDENT_AMBULATORY_CARE_PROVIDER_SITE_OTHER): Payer: Medicare Other | Admitting: Neurology

## 2020-02-20 ENCOUNTER — Encounter: Payer: Self-pay | Admitting: Neurology

## 2020-02-20 VITALS — BP 121/78 | HR 87 | Ht 67.0 in | Wt 212.0 lb

## 2020-02-20 DIAGNOSIS — R5383 Other fatigue: Secondary | ICD-10-CM

## 2020-02-20 DIAGNOSIS — R208 Other disturbances of skin sensation: Secondary | ICD-10-CM

## 2020-02-20 DIAGNOSIS — R260 Ataxic gait: Secondary | ICD-10-CM | POA: Diagnosis not present

## 2020-02-20 DIAGNOSIS — G4700 Insomnia, unspecified: Secondary | ICD-10-CM | POA: Diagnosis not present

## 2020-02-20 DIAGNOSIS — G35 Multiple sclerosis: Secondary | ICD-10-CM

## 2020-02-20 MED ORDER — HYDROCODONE-ACETAMINOPHEN 5-325 MG PO TABS: 1.0000 | ORAL_TABLET | Freq: Three times a day (TID) | ORAL | 0 refills | Status: DC | PRN

## 2020-02-20 MED ORDER — TEMAZEPAM 15 MG PO CAPS: 15.0000 mg | ORAL_CAPSULE | Freq: Every evening | ORAL | 2 refills | Status: DC | PRN

## 2020-02-20 NOTE — Progress Notes (Signed)
[facility]  PATIENT: Grace [name] DOB: 1960-12-26  REFERRING DOCTOR OR PCP:  [name] SOURCE: patient  _________________________________   HISTORICAL  CHIEF COMPLAINT:  Chief Complaint  Patient presents with  . Follow-up    RM 12 with husband. Last seen 11/15/2019.   . Multiple Sclerosis    off DMT  . Pain    Takes hydrocodone as needed, Requesting refill on hydrocodone    HISTORY OF PRESENT ILLNESS:  Grace [name] is a 59 y.o. woman with relapsing remitting multiple sclerosis.     Update 02/20/2020: She is off any DMT for MS and has felt neurologically stable.   She has had more issues with vision lately.  She had inflammation and elevated IOP after cataract surgery and needed to have a steroid pellet injected.   She is sleeping poorly despite hydrocodone, baclofen, doxepin, melatonin, benadryl at night.  She has more trouble staying asleep than falling asleep.   She had been on lorazepam in the past.   Ambien had helped her some in the past but she is concerned about side effects (one of her friends had sleep-driving and wrecked).    She feels her MS is fairly stable.  Specifically, gait is mildly off balance but stable.  She has no recent falls.  She will sometimes bump into things.  She has some pins-and-needles dysesthesias in the hands..   She continues to experience pain in the neck and back.  The back pain and dysesthesias are helped by gabapentin and hydrocodone.  She has not done the Covid vaccination as she has had reactions to the flu vaccine.    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 She had optic neuritis in 1999. In 2002, she had right-sided numbness. MRIs of the brain show just a couple of nonspecific foci. A cervical spine MRI showed a focus consistent with demyelination at C5. Additionally, there are 2 smaller foci at T2 and T3-T4.  CSF was also abnormal c/w MS.     She  was initially placed on Betaseron and then on Copaxone. She was unable to tolerate either of these and has not been on any DMT medication since 2003.   She retried Betaseron but stopped after diagnosis of avascular necrosis requiring hip replacement.  She was on Copaxone for a while.   Of note, she had had only one course of IV steroids prior to that and has had some afterwards.   She had not wanted to go on Tysabri or Gilenya and opted to go on no disease modifying therapy since 2012. At her last visit on 01/17/2014, she decided to go on Aubagio but due to elevated liver function test stopped..     Most recent MRIs are from earlier this year and showed no changes.  She has been off of all disease modifying therapy since 2018.  02/05/2018: Brain MRI showed "Multiple T2/FLAIR hyperintense foci in the hemispheres.  This is a nonspecific finding and could be due to chronic microvascular ischemic changes or 2 demyelination.  None of the foci appears to be acute.  When compared to the MRI dated 07/03/2016, there is no interval change.  There is a normal enhancement pattern.   REVIEW OF SYSTEMS: Constitutional: No fevers, chills, sweats, or change in appetite.  Has fatigue Eyes: No visual changes, double vision, eye pain Ear, nose and throat: No hearing loss, ear pain, nasal congestion, sore throat  Cardiovascular: No chest pain, palpitations Respiratory: No shortness of breath at rest or with exertion.   No wheezes GastrointestinaI: No nausea, vomiting, diarrhea, abdominal pain, fecal incontinence Genitourinary: No dysuria, urinary retention or frequency.  No nocturia. Musculoskeletal: She reports back pain and pain and some of her joints, especially her knees and hips. Integumentary: No rash, pruritus, skin lesions Neurological: as above.  She reports restless leg, insomnia and excessive daytime sleepiness. Psychiatric: No depression at this time.  No anxiety Endocrine: No palpitations, diaphoresis,  change in appetite, change in weigh.  He notes heat intolerance and excessive thirst at times. Hematologic/Lymphatic: No anemia, purpura, petechiae. Allergic/Immunologic: No itchy/runny eyes, nasal congestion, recent allergic reactions, rashes  ALLERGIES: Allergies  Allergen Reactions  . Lamictal [Lamotrigine] Shortness Of Breath, Itching and Swelling    Tongue swells  . Mobic [Meloxicam] Anaphylaxis  . Ultram [Tramadol Hcl] Anaphylaxis  . Codeine Itching  . Diclofenac Itching  . Latex     Latex band-aids cause redness and tears your skin  . Nitrofurantoin     Causing itching all over, tongue swelling, chest heaviness, swollen lips, cough, rapid HR, swollen eyelids, red eyes/gums, lips red/white per pt.  . Teriflunomide Diarrhea    Hair loss, joint pains, elevated liver enzymes abugio (joint pains and liver issues)  . Trazodone And Nefazodone     Mouth sores, tongue swelling  . Tape Rash    HOME MEDICATIONS:  Current Outpatient Medications:  .  Ascorbic Acid (VITAMIN C) 1000 MG tablet, Take 1,000 mg by mouth daily., Disp: , Rfl:  .  aspirin EC 81 MG tablet, Take 81 mg by mouth daily., Disp: , Rfl:  .  baclofen (LIORESAL) 10 MG tablet, TAKE 2 TABLETS (20 MG TOTAL) BY MOUTH 3 (THREE) TIMES DAILY., Disp: 180 tablet, Rfl: 11 .  brimonidine (ALPHAGAN) 0.2 % ophthalmic solution, , Disp: , Rfl:  .  Cholecalciferol (VITAMIN D) 50 MCG (2000 UT) CAPS, Take 2,000 Units by mouth daily. , Disp: , Rfl:  .  CINNAMON PO, Take 1,200 mg by mouth daily. , Disp: , Rfl:  .  diphenhydrAMINE (BENADRYL) 25 MG tablet, Take 25 mg by mouth 3 (three) times daily. Takes with Hydrocodone, Disp: , Rfl:  .  doxepin (SINEQUAN) 10 MG capsule, TAKE 1 TO 2 CAPSULES AT BEDTIME, Disp: 180 capsule, Rfl: 3 .  ELDERBERRY PO, Take by mouth daily., Disp: , Rfl:  .  ergocalciferol (VITAMIN D2) 1.25 MG (50000 UT) capsule, Take 50,000 Units by mouth 2 (two) times a week., Disp: , Rfl:  .  etodolac (LODINE) 500 MG tablet,  Take 500 mg by mouth. 1-2 times a day, Disp: , Rfl:  .  Evening Primrose Oil 1000 MG CAPS, 1300mg  daily, Disp: , Rfl:  .  famotidine (PEPCID) 20 MG tablet, Take 1 tablet (20 mg total) by mouth 2 (two) times daily. (Patient taking differently: Take 20 mg by mouth daily. ), Disp: 180 tablet, Rfl: 1 .  Flaxseed, Linseed, (FLAX SEED OIL) 1000 MG CAPS, Take 1,200 mg by mouth daily. , Disp: , Rfl:  .  gabapentin (NEURONTIN) 300 MG capsule, Take 1 capsule (300 mg total) by mouth 3 (three) times daily. (Patient taking differently: Take 300 mg by mouth 3 (three) times daily. Taking as needed), Disp: 90 capsule, Rfl: 11 .  Grape Seed 50 MG CAPS, Take 2 capsules by mouth daily. , Disp: , Rfl:  .  HYDROcodone-acetaminophen (NORCO/VICODIN) 5-325 MG tablet, Take 1 tablet by mouth 3 (three) times daily as  needed for moderate pain., Disp: 90 tablet, Rfl: 0 .  ipratropium (ATROVENT) 0.06 % nasal spray, Place 2 sprays into both nostrils 2 (two) times daily., Disp: , Rfl:  .  L-Lysine 1000 MG TABS, Take by mouth. As needed, Disp: , Rfl:  .  Lactobacillus (PROBIOTIC ACIDOPHILUS PO), Take by mouth daily. Digestive Advantage, Disp: , Rfl:  .  levocetirizine (XYZAL) 5 MG tablet, TAKE 1 TABLET BY MOUTH EVERY DAY IN THE EVENING, Disp: 90 tablet, Rfl: 2 .  loratadine-pseudoephedrine (QC LORATADINE-D) 10-240 MG 24 hr tablet, Take 1 tablet by mouth daily., Disp: 90 tablet, Rfl: 2 .  LORazepam (ATIVAN) 1 MG tablet, Take 1 tablet (1 mg total) by mouth at bedtime., Disp: 30 tablet, Rfl: 5 .  Lutein-Bilberry (BILBERRY PLUS LUTEIN PO), Take by mouth daily., Disp: , Rfl:  .  Melatonin 10 MG TABS, Take 1 tablet by mouth at bedtime. , Disp: , Rfl:  .  metFORMIN (GLUCOPHAGE) 500 MG tablet, 1/2 tab daily with lunch, Disp: 30 tablet, Rfl: 0 .  Moringa 500 MG CAPS, Take 2 capsules by mouth daily., Disp: , Rfl:  .  Multiple Vitamins-Minerals (HAIR/SKIN/NAILS/BIOTIN) TABS, Take by mouth daily. , Disp: , Rfl:  .  Netarsudil-Latanoprost  (ROCKLATAN) 0.02-0.005 % SOLN, Place 1 drop into the right eye at bedtime., Disp: , Rfl:  .  nystatin-triamcinolone ointment (MYCOLOG), Apply 1 application topically 2 (two) times daily., Disp: 30 g, Rfl: 2 .  pilocarpine (PILOCAR) 1 % ophthalmic solution, , Disp: , Rfl:  .  rizatriptan (MAXALT-MLT) 10 MG disintegrating tablet, Take 1 tablet (10 mg total) by mouth as needed for migraine. May repeat in 2 hours if needed.  Max of 2 tablets in 24 hours, and 10 tablets per month, Disp: 10 tablet, Rfl: 11 .  timolol (BETIMOL) 0.5 % ophthalmic solution, Place 1 drop into the right eye 2 (two) times daily., Disp: , Rfl:  .  tiZANidine (ZANAFLEX) 2 MG tablet, Take up to 4 pills a day, Disp: 120 tablet, Rfl: 11 .  vitamin E 1000 UNIT capsule, Take 1,000 Units by mouth daily. , Disp: , Rfl:  .  zolpidem (AMBIEN) 10 MG tablet, Take 10 mg by mouth at bedtime as needed. For sleep, Disp: , Rfl:  .  temazepam (RESTORIL) 15 MG capsule, Take 1 capsule (15 mg total) by mouth at bedtime as needed for sleep., Disp: 30 capsule, Rfl: 2  PAST MEDICAL HISTORY: Past Medical History:  Diagnosis Date  . Allergy   . Anxiety   . Avascular necrosis ([place])   . Back pain   . Cancer ([place])   . Chest pain   . Chewing difficulty   . Chronic fatigue syndrome   . Colon polyp    pre-cancerous  . Complication of anesthesia    patient states she requires more anesthesia  . Depression   . Disorder of soft tissue   . Diverticulosis   . Dizziness   . Edema, lower extremity   . Fatty liver   . Fibromyalgia   . GERD (gastroesophageal reflux disease)   . H/O breast biopsy    pre- cancerous cells  . Hand fracture, left    9/87  . Heart palpitations   . Heart valve disease   . History of Holter monitoring   . Hyperlipidemia   . Insomnia   . Joint pain   . Lactose intolerance   . Lhermitte's syndrome   . Migraines   . Multiple food allergies   . Multiple  sclerosis ([place])    dx in 11/1996  . Multiple sclerosis ([place])    . Neuromuscular disorder ([place])   . Neuropathy   . Numbness and tingling in hands   . Obesity   . Optic neuritis   . OSA (obstructive sleep apnea)    no longer have OSA since stopped taking a MS medication  . Osteoarthritis   . Otitis media   . Palpitations   . Prediabetes   . Raynaud's phenomenon   . Shortness of breath   . Sleep apnea    Due to medication that she no longer takes.  . Swallowing difficulty   . Vaginitis and vulvovaginitis   . Vitamin B12 deficiency   . Vitamin D deficiency     PAST SURGICAL HISTORY: Past Surgical History:  Procedure Laterality Date  . ABDOMINAL HYSTERECTOMY Bilateral 02/12/2016   Procedure: HYSTERECTOMY ABDOMINAL WITH BILATERAL SALPINGO OOPHERECTOMY;  Surgeon: Dian Queen, MD;  Location: [facility];  Service: Gynecology;  Laterality: Bilateral;  . BREAST EXCISIONAL BIOPSY     core/left breast  . CATARACT EXTRACTION, BILATERAL  06/2019  . CHOLECYSTECTOMY  1983  . COLONOSCOPY     1997/2001  . DILATION AND CURETTAGE OF UTERUS    . ENDOMETRIAL ABLATION  2012  . LAPAROSCOPIC ABDOMINAL EXPLORATION     ovaries and intestines bond together  . LEFT HEART CATHETERIZATION WITH CORONARY ANGIOGRAM N/A 06/21/2011   Procedure: LEFT HEART CATHETERIZATION WITH CORONARY ANGIOGRAM;  Surgeon: Leonie Man, MD;  Location: [facility] C[facility] LAB;  Service: Cardiovascular;  Laterality: N/A;  . LUMBAR PUNCTURE     11/01/1996  . SALPINGOOPHORECTOMY Bilateral 02/12/2016   Procedure: BILATERAL SALPINGO OOPHORECTOMY;  Surgeon: Dian Queen, MD;  Location: [facility];  Service: Gynecology;  Laterality: Bilateral;  . TOTAL HIP ARTHROPLASTY     left hip  6/02  /right hip12/04    FAMILY HISTORY: Family History  Problem Relation Age of Onset  . Pancreatic cancer Mother   . Stroke Mother   . Hypertension Mother   . Hyperlipidemia Mother   . Heart disease Mother   . Cancer Mother   . Depression Mother   . Anxiety disorder Mother   . Alcoholism Mother   . Testicular  cancer Brother   . Colon cancer Maternal Uncle   . Pancreatic cancer Paternal Uncle   . Prostate cancer Maternal Grandfather   . Colon polyps Father   . Diabetes Father   . Hypertension Father   . Hyperlipidemia Father   . Kidney disease Father   . Sleep apnea Father   . Obesity Father     SOCIAL HISTORY:  Social History   Socioeconomic History  . Marital status: Married    Spouse name: Not on file  . Number of children: Not on file  . Years of education: Not on file  . Highest education level: Not on file  Occupational History  . Occupation: disabled  Tobacco Use  . Smoking status: Former Smoker    Packs/day: 0.25    Years: 8.00    Pack years: 2.00    Types: Cigarettes    Quit date: 07/01/1995    Years since quitting: 24.6  . Smokeless tobacco: Never Used  Vaping Use  . Vaping Use: Never used  Substance and Sexual Activity  . Alcohol use: No    Alcohol/week: 0.0 standard drinks  . Drug use: Yes    Types: Hydrocodone  . Sexual activity: Not Currently  Other Topics Concern  . Not on file  Social History Narrative  . Not on file   Social Determinants of Health   Financial Resource Strain: Low Risk   . Difficulty of Paying Living Expenses: Not hard at all  Food Insecurity: Food Insecurity Present  . Worried About Charity fundraiser in the Last Year: Sometimes true  . Ran Out of Food in the Last Year: Never true  Transportation Needs: No Transportation Needs  . Lack of Transportation (Medical): No  . Lack of Transportation (Non-Medical): No  Physical Activity: Inactive  . Days of Exercise per Week: 0 days  . Minutes of Exercise per Session: 0 min  Stress: No Stress Concern Present  . Feeling of Stress : Not at all  Social Connections:   . Frequency of Communication with Friends and Family: Not on file  . Frequency of Social Gatherings with Friends and Family: Not on file  . Attends Religious Services: Not on file  . Active Member of Clubs or  Organizations: Not on file  . Attends Archivist Meetings: Not on file  . Marital Status: Not on file  Intimate Partner Violence:   . Fear of Current or Ex-Partner: Not on file  . Emotionally Abused: Not on file  . Physically Abused: Not on file  . Sexually Abused: Not on file     PHYSICAL EXAM  Vitals:   02/20/20 1411  BP: 121/78  Pulse: 87  Weight: 212 lb (96.2 kg)  Height: 5\' 7"  (1.702 m)    Body mass index is 33.2 kg/m.   General: The patient is well-developed and well-nourished and in no acute distress  Neurologic Exam  Mental status: The patient is alert and oriented x 3 at the time of the examination. The patient has apparent normal recent and remote memory, with an apparently normal attention span and concentration ability.   Speech is normal.  Cranial nerves: Extraocular movements are full.  She has altered color vision on the right.  Facial strength and sensation was normal.. No obvious hearing deficits are noted.  Motor:  Muscle bulk is normal.   She has increased muscle tone in the legs, left greater than right.  Strength is 5/5 except for the left 4+/5 left EHL  Sensory: Sensory testing shows reduced touch sensation on the right side and reduced vibration on the right..   Coordination: Cerebellar testing reveals good finger-nose-finger and mildly reduced bilateral heel-to-shin  Gait and station: Station is normal.  The gait is mildly wide.  Tandem gait is poor..  Romberg is negative.   Reflexes: Deep tendon reflexes are symmetric and normal bilaterally.         ASSESSMENT AND PLAN  Multiple sclerosis (HCC)  Dysesthesia  Insomnia, unspecified type  Ataxic gait  Other fatigue   1.   She will continue off a DMT 2   add temazepam for insomnia. 3.   She would prefer to remain off and DMT for her MS  4.   Hydrocodone up to 3 times a day for pain.  The PDMP was reviewed and she is not getting opiates from other doctors.  She does not show  any drug-seeking behavior. 5.  She will return to see me in 3-4 months or sooner if she has new or worsening neurologic symptoms.    Isayah Ignasiak A. Felecia Shelling, MD, PhD 38/25/0539, 7:67 PM Certified in Neurology, Clinical Neurophysiology, Sleep Medicine, Pain Medicine and Neuroimaging  [facility] [address], [place], [place] [address] [phone]

## 2020-02-21 ENCOUNTER — Telehealth: Payer: Medicare Other

## 2020-02-21 ENCOUNTER — Telehealth (INDEPENDENT_AMBULATORY_CARE_PROVIDER_SITE_OTHER): Payer: Medicare Other | Admitting: Psychology

## 2020-02-21 DIAGNOSIS — F3289 Other specified depressive episodes: Secondary | ICD-10-CM | POA: Diagnosis not present

## 2020-02-21 NOTE — Progress Notes (Signed)
  Office: [phone]  /  Fax: [phone]    Date: March 06, 2020   Appointment Start Time: 11:02am Duration: 31 minutes Provider: Glennie Isle, Psy.D. Type of Session: Individual Therapy  Location of Patient: Home Location of Provider: Provider's Home Type of Contact: Telepsychological Visit via MyChart Video Visit  Session Content: Grace [name] is a 59 y.o. female presenting for a follow-up appointment to address the previously established treatment goal of increasing coping skills. Today's appointment was a telepsychological visit due to COVID-19. Grace [name] provided verbal consent for today's telepsychological appointment and she is aware she is responsible for securing confidentiality on her end of the session. Prior to proceeding with today's appointment, Grace [name]'s physical location at the time of this appointment was obtained as well a phone number she could be reached at in the event of technical difficulties. Grace [name] and this provider participated in today's telepsychological service.   This provider conducted a brief check-in. Grace [name] provided updates about her eyes, noting she continues to follow up with her doctor. Regarding eating, Grace [name] reported she is trying to eat everything on her meal plan. Positive reinforcement was provided. Session focused on making better choices and engaging in portion control when out for appointments. Grace [name] was receptive to taking food/snacks with her for appointments/errands to avoid increased hunger that can result in overeating/rapid eating once home. Additionally, psychoeducation regarding triggers for emotional eating was provided. Grace [name] provided verbal consent during today's appointment for this provider to send a handout about triggers via e-mail. Furthermore, termination planning was discussed. Grace [name] was receptive to a follow-up appointment in 2-3 weeks. She will continue to meet with Grace Gianotti, LCSW for therapeutic services. Grace [name] was receptive to today's  appointment as evidenced by openness to sharing, responsiveness to feedback, and willingness to explore triggers for emotional eating.  Mental Status Examination:  Appearance: well groomed and appropriate hygiene  Behavior: appropriate to circumstances Mood: euthymic, but tired Affect: mood congruent Speech: normal in rate, volume, and tone Eye Contact: appropriate Psychomotor Activity: appropriate Gait: unable to assess Thought Process: linear, logical, and goal directed  Thought Content/Perception: no hallucinations, delusions, bizarre thinking or behavior reported or observed and no evidence of suicidal and homicidal ideation, plan, and intent Orientation: time, person, place, and purpose of appointment Memory/Concentration: memory, attention, language, and fund of knowledge intact  Insight/Judgment: fair  Interventions:  Conducted a brief chart review Provided empathic reflections and validation Provided positive reinforcement Employed supportive psychotherapy interventions to facilitate reduced distress and to improve coping skills with identified stressors Psychoeducation provided regarding triggers for emotional eating  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 Grace [name] has demonstrated progress in her goal as evidenced by increased awareness of hunger patterns. Grace [name] also continues to demonstrate willingness to engage in learned skill(s) and demonstrated willingness to identify triggers for emotional eating.   Plan: The next appointment will be scheduled in two weeks, which will be via MyChart Video Visit. The next session will focus on working towards the established treatment goal.

## 2020-02-22 ENCOUNTER — Telehealth: Payer: Self-pay

## 2020-02-22 NOTE — Telephone Encounter (Signed)
Forms faxed to [phone]

## 2020-03-03 ENCOUNTER — Other Ambulatory Visit: Payer: Self-pay

## 2020-03-03 ENCOUNTER — Ambulatory Visit (INDEPENDENT_AMBULATORY_CARE_PROVIDER_SITE_OTHER): Payer: Medicare Other | Admitting: Family Medicine

## 2020-03-03 ENCOUNTER — Encounter (INDEPENDENT_AMBULATORY_CARE_PROVIDER_SITE_OTHER): Payer: Self-pay | Admitting: Family Medicine

## 2020-03-03 VITALS — BP 115/74 | HR 98 | Temp 97.9°F | Ht 67.0 in | Wt 207.0 lb

## 2020-03-03 DIAGNOSIS — Z6832 Body mass index (BMI) 32.0-32.9, adult: Secondary | ICD-10-CM | POA: Diagnosis not present

## 2020-03-03 DIAGNOSIS — R7303 Prediabetes: Secondary | ICD-10-CM

## 2020-03-03 DIAGNOSIS — E669 Obesity, unspecified: Secondary | ICD-10-CM | POA: Diagnosis not present

## 2020-03-03 MED ORDER — METFORMIN HCL 500 MG PO TABS: ORAL_TABLET | ORAL | 0 refills | Status: DC

## 2020-03-05 NOTE — Progress Notes (Signed)
Chief Complaint:   OBESITY Grace [name] is here to discuss her progress with her obesity treatment plan along with follow-up of her obesity related diagnoses. [name] is on the Category 1 Plan and states she is following her eating plan approximately 50% of the time. [name] states she is exercising for 0 minutes 0 times per week.  Today's visit was #: 7 Starting weight: 221 lbs Starting date: 11/22/2019 Today's weight: 207 lbs Today's date: 03/03/2020 Total lbs lost to date: 14 lbs Total lbs lost since last in-office visit: 1 lb Total weight loss percentage to date: -6.33%  Interim History: [name] is still skipping meals.  She will eat eggs or a yogurt in the morning, but not the entire meal.  She will have a microwave meal around 5 pm and then skips dinner/misses her 6 ounce protein and vegetables.  She then occasionally has a high protein Ensure instead of breakfast.  Today, she had just one Ensure High Protein drink and nothing else and it is almost 1 pm.  Assessment/Plan:   1. Prediabetes She is taking metformin 500 mg daily with lunch, which was started at the end of September (02/05/2020).  Denies concerns or complaints.  Plan:  Recheck levels 3 months after starting metformin.  Continue prudent nutritional plan and weight loss.  Lab Results  Component Value Date   HGBA1C 5.9 (H) 11/22/2019   Lab Results  Component Value Date   INSULIN 29.8 (H) 10/10/2019   -Refill metFORMIN (GLUCOPHAGE) 500 MG tablet; 1 tab daily with lunch  Dispense: 30 tablet; Refill: 0   2. Class 1 obesity with serious comorbidity and body mass index (BMI) of 32.0 to 32.9 in adult, unspecified obesity type  [name] is currently in the action stage of change. As such, her goal is to continue with weight loss efforts. She has agreed to the Category 1 Plan.   Exercise goals: All adults should avoid inactivity. Some physical activity is better than none, and adults who participate in any amount of physical  activity gain some health benefits.  Behavioral modification strategies: meal planning and cooking strategies, keeping healthy foods in the home and planning for success.  [name] has agreed to follow-up with our clinic in 2-3 weeks. She was informed of the importance of frequent follow-up visits to maximize her success with intensive lifestyle modifications for her multiple health conditions.   Objective:   Blood pressure 115/74, pulse 98, temperature 97.9 F (36.6 C), height 5\' 7"  (1.702 m), weight 207 lb (93.9 kg), last menstrual period 04/09/2013. Body mass index is 32.42 kg/m.  General: Cooperative, alert, well developed, in no acute distress. HEENT: Conjunctivae and lids unremarkable. Cardiovascular: Regular rhythm.  Lungs: Normal work of breathing. Neurologic: No focal deficits.   Lab Results  Component Value Date   CREATININE 0.66 10/10/2019   BUN 12 10/10/2019   NA 143 10/10/2019   K 4.3 10/10/2019   CL 106 10/10/2019   CO2 21 10/10/2019   Lab Results  Component Value Date   ALT 31 10/10/2019   AST 32 10/10/2019   ALKPHOS 104 10/10/2019   BILITOT 0.6 10/10/2019   Lab Results  Component Value Date   HGBA1C 5.9 (H) 11/22/2019   Lab Results  Component Value Date   INSULIN 29.8 (H) 10/10/2019   Lab Results  Component Value Date   TSH 1.500 11/22/2019   Lab Results  Component Value Date   CHOL 205 (H) 11/22/2019   HDL 108 11/22/2019   [place]  71 11/22/2019   TRIG 161 (H) 11/22/2019   CHOLHDL 1.9 11/22/2019   Lab Results  Component Value Date   WBC 10.3 11/22/2019   HGB 13.7 11/22/2019   HCT 40.3 11/22/2019   MCV 93 11/22/2019   PLT 275 11/22/2019   Obesity Behavioral Intervention:   Approximately 12 minutes were spent on the discussion below.  ASK: We discussed the diagnosis of obesity with Lindalou today and Amaziah agreed to give [place] permission to discuss obesity behavioral modification therapy today.  ASSESS: [name] has the diagnosis of obesity and  her BMI today is 32.5. [name] is in the action stage of change.   ADVISE: Shatisha was educated on the multiple health risks of obesity as well as the benefit of weight loss to improve her health. She was advised of the need for long term treatment and the importance of lifestyle modifications to improve her current health and to decrease her risk of future health problems.  AGREE: Multiple dietary modification options and treatment options were discussed and [name] agreed to follow the recommendations documented in the above note.  ARRANGE: [name] was educated on the importance of frequent visits to treat obesity as outlined per CMS and USPSTF guidelines and agreed to schedule her next follow up appointment today.  Attestation Statements:   Reviewed by clinician on day of visit: allergies, medications, problem list, medical history, surgical history, family history, social history, and previous encounter notes.  I, Water quality scientist, CMA, am acting as Location manager for [employer], DO.  I have reviewed the above documentation for accuracy and completeness, and I agree with the above. Marjory Sneddon, D.O.  The [place] was signed into law in 2016 which includes the topic of electronic health records.  This provides immediate access to information in MyChart.  This includes consultation notes, operative notes, office notes, lab results and pathology reports.  If you have any questions about what you read please let us know at your next visit so we can discuss your concerns and take corrective action if need be.  We are right here with you.

## 2020-03-06 ENCOUNTER — Encounter (INDEPENDENT_AMBULATORY_CARE_PROVIDER_SITE_OTHER): Payer: Self-pay | Admitting: Family Medicine

## 2020-03-06 ENCOUNTER — Telehealth (INDEPENDENT_AMBULATORY_CARE_PROVIDER_SITE_OTHER): Payer: Medicare Other | Admitting: Psychology

## 2020-03-06 DIAGNOSIS — F3289 Other specified depressive episodes: Secondary | ICD-10-CM

## 2020-03-06 NOTE — Progress Notes (Signed)
  Office: [phone]  /  Fax: [phone]    Date: March 20, 2020   Appointment Start Time: 1:53pm Duration: 33 minutes Provider: Glennie Isle, Psy.D. Type of Session: Individual Therapy  Location of Patient: Home Location of Provider: Provider's Home Type of Contact: Telepsychological Visit via MyChart Video Visit  Session Content: Grace [name] is a 59 y.o. female presenting for a follow-up appointment to address the previously established treatment goal of increasing coping skills. Today's appointment was a telepsychological visit due to COVID-19. Grace [name] provided verbal consent for today's telepsychological appointment and she is aware she is responsible for securing confidentiality on her end of the session. Prior to proceeding with today's appointment, Grace [name]'s physical location at the time of this appointment was obtained as well a phone number she could be reached at in the event of technical difficulties. Grace [name] and this provider participated in today's telepsychological service.   This provider conducted a brief check-in. Grace [name] reported she maintained her weight. She discussed increased stress last week, as her septic tank has issues, there is floor damage in her home, and the pressure in her eye has increased again. She noted having lunch with her friend was helpful and discussed plans with another friend for this weekend. Positive reinforcement was provided. Additionally, a plan was developed to help Grace [name] cope with emotional eating in the future using learned skills. She wrote down the following plan: focus on hydration; be prepared with snacks congruent to the meal plan; pause to ask questions when experiencing urges/cravings (e.g., Am I really hungry?, Is there something bothering me?, and Will I feel better if I eat?); and engage in discussed coping strategies after going through the aforementioned questions. Grace [name] was receptive to today's appointment as evidenced by openness to sharing,  responsiveness to feedback, and willingness to continue engaging in learned skills.  Mental Status Examination:  Appearance: well groomed and appropriate hygiene  Behavior: appropriate to circumstances Mood: euthymic Affect: mood congruent Speech: normal in rate, volume, and tone Eye Contact: appropriate Psychomotor Activity: appropriate Gait: unable to assess Thought Process: linear, logical, and goal directed  Thought Content/Perception: no hallucinations, delusions, bizarre thinking or behavior reported or observed and denies suicidal and homicidal ideation, plan, and intent Orientation: time, person, place, and purpose of appointment Memory/Concentration: memory, attention, language, and fund of knowledge intact  Insight/Judgment: fair  Interventions:  Conducted a brief chart review Provided empathic reflections and validation Employed supportive psychotherapy interventions to facilitate reduced distress and to improve coping skills with identified stressors Reviewed learned skills  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 Grace [name] demonstrated progress in her goal as evidenced by increased awareness of hunger patterns, increased awareness of triggers for emotional eating and reduction in emotional eating. Grace [name] also continues to demonstrate willingness to engage in learned skill(s).  Plan: Today was Grace [name]'s last appointment with this provider. She acknowledged understanding that she may request a follow-up appointment with this provider in the future (following this provider's maternity leave as previously discussed) as long as she is still established with the clinic. Additionally, Grace [name] stated plans to continue meeting with her other therapist, Myra Gianotti, LCSW. No further  follow-up planned by this provider.

## 2020-03-06 NOTE — Telephone Encounter (Signed)
Dr Opalski, please advise  

## 2020-03-12 ENCOUNTER — Ambulatory Visit: Payer: Self-pay | Admitting: Pharmacist

## 2020-03-12 NOTE — Chronic Care Management (AMB) (Signed)
Chronic Care Management Pharmacy  Name: Grace [name]  MRN: [id number] DOB: 02/14/1961  Chief Complaint/ HPI  Grace [name],  59 y.o. , female presents for their Follow-Up CCM visit with the clinical pharmacist via telephone due to COVID-19 Pandemic.  PCP : Grace Chard, MD  Medications: Outpatient Encounter Medications as of 03/12/2020  Medication Sig Note  . Ascorbic Acid (VITAMIN C) 1000 MG tablet Take 1,000 mg by mouth daily.   [name] Kitchen aspirin EC 81 MG tablet Take 81 mg by mouth daily.   . baclofen (LIORESAL) 10 MG tablet TAKE 2 TABLETS (20 MG TOTAL) BY MOUTH 3 (THREE) TIMES DAILY.   . brimonidine (ALPHAGAN) 0.2 % ophthalmic solution    . Cholecalciferol (VITAMIN D) 50 MCG (2000 UT) CAPS Take 2,000 Units by mouth daily.    [name] Kitchen CINNAMON PO Take 1,200 mg by mouth daily.    . diphenhydrAMINE (BENADRYL) 25 MG tablet Take 25 mg by mouth 3 (three) times daily. Takes with Hydrocodone   . doxepin (SINEQUAN) 10 MG capsule TAKE 1 TO 2 CAPSULES AT BEDTIME   . ELDERBERRY PO Take by mouth daily.   . ergocalciferol (VITAMIN D2) 1.25 MG (50000 UT) capsule Take 50,000 Units by mouth 2 (two) times a week.   . etodolac (LODINE) 500 MG tablet Take 500 mg by mouth. 1-2 times a day   . Evening Primrose Oil 1000 MG CAPS 1300mg  daily   . famotidine (PEPCID) 20 MG tablet Take 1 tablet (20 mg total) by mouth 2 (two) times daily. (Patient taking differently: Take 20 mg by mouth daily. )   . Flaxseed, Linseed, (FLAX SEED OIL) 1000 MG CAPS Take 1,200 mg by mouth daily.    [name] Kitchen gabapentin (NEURONTIN) 300 MG capsule Take 1 capsule (300 mg total) by mouth 3 (three) times daily. (Patient taking differently: Take 300 mg by mouth 3 (three) times daily. Taking as needed) 03/08/2018: Rx placed up front for patient pick up 03/08/18 ELK,RN  . Grape Seed 50 MG CAPS Take 2 capsules by mouth daily.    [name] Kitchen HYDROcodone-acetaminophen (NORCO/VICODIN) 5-325 MG tablet Take 1 tablet by mouth 3 (three) times daily as needed  for moderate pain.   [name] Kitchen ipratropium (ATROVENT) 0.06 % nasal spray Place 2 sprays into both nostrils 2 (two) times daily.   [name] Kitchen L-Lysine 1000 MG TABS Take by mouth. As needed   . Lactobacillus (PROBIOTIC ACIDOPHILUS PO) Take by mouth daily. Digestive Advantage   . levocetirizine (XYZAL) 5 MG tablet TAKE 1 TABLET BY MOUTH EVERY DAY IN THE EVENING   . loratadine-pseudoephedrine (QC LORATADINE-D) 10-240 MG 24 hr tablet Take 1 tablet by mouth daily.   [name] Kitchen LORazepam (ATIVAN) 1 MG tablet Take 1 tablet (1 mg total) by mouth at bedtime.   . Lutein-Bilberry (BILBERRY PLUS LUTEIN PO) Take by mouth daily.   . Melatonin 10 MG TABS Take 1 tablet by mouth at bedtime.    . metFORMIN (GLUCOPHAGE) 500 MG tablet 1 tab daily with lunch   . [place] 500 MG CAPS Take 2 capsules by mouth daily.   . Multiple Vitamins-Minerals (HAIR/SKIN/NAILS/BIOTIN) TABS Take by mouth daily.    . Netarsudil-Latanoprost (ROCKLATAN) 0.02-0.005 % SOLN Place 1 drop into the right eye at bedtime.   [name] Kitchen nystatin-triamcinolone ointment (MYCOLOG) Apply 1 application topically 2 (two) times daily.   . pilocarpine (PILOCAR) 1 % ophthalmic solution    . rizatriptan (MAXALT-MLT) 10 MG disintegrating tablet Take 1 tablet (10 mg total) by mouth as needed for migraine. May  repeat in 2 hours if needed.  Max of 2 tablets in 24 hours, and 10 tablets per month   . temazepam (RESTORIL) 15 MG capsule Take 1 capsule (15 mg total) by mouth at bedtime as needed for sleep.   [name] Kitchen timolol (BETIMOL) 0.5 % ophthalmic solution Place 1 drop into the right eye 2 (two) times daily.   [name] Kitchen tiZANidine (ZANAFLEX) 2 MG tablet Take up to 4 pills a day   . vitamin E 1000 UNIT capsule Take 1,000 Units by mouth daily.    [name] Kitchen zolpidem (AMBIEN) 10 MG tablet Take 10 mg by mouth at bedtime as needed. For sleep   . [DISCONTINUED] amantadine (SYMMETREL) 100 MG capsule Take 100 mg by mouth 2 (two) times daily. Reported on 07/07/2015    No facility-administered encounter medications on file as of  03/12/2020.    Current Diagnosis/Assessment:   Goals Addressed   None     Prediabetes   A1c goal <6.5%  Recent Relevant Labs: Lab Results  Component Value Date/Time   HGBA1C 5.9 (H) 11/22/2019 05:10 PM    Last diabetic Eye exam: No results found for: HMDIABEYEEXA  Last diabetic Foot exam: No results found for: HMDIABFOOTEX   Patient has failed these meds in past: N/A Patient is currently controlled on the following medications: [name] Kitchen Metformin 500mg  1/2 tablet daily with lunch   Diet: she is reporting to the healthy weight clinic following their meal plan very well.  She admits that the bread they want her to try is challenging.  Is now more aware of carbohydrates in various things.  States she has a difficult time eating as much as they want her to.  The main thing she has cut back on is the breads.  Congratulated her on her efforts and encouraged her to continue to participate in this program.  Exercise: same as previous occasional walking Plan Continue current medications   Cataracts   Patient has failed these meds in past: N/A Patient is currently uncontrolled on the following medications:   Brimonidine 0.2% ophthalmic   Rocklatan .00-0.005% eye drops  Timolol 0.5% solution  We discussed:     Pt has had cataract surgeries (reports 5 surgeries)   She is still having issues with increased intraocular pressure  On 3 new eye drops before surgery is required. She is very anxious about this and hoping she can get it down without having to have surgery.   Plan Continue current medications   Beverly Milch, PharmD Clinical Pharmacist [facility] [phone]

## 2020-03-13 NOTE — Patient Instructions (Addendum)
Visit Information  Goals Addressed            This Visit's Progress   . Pharmacy Care Plan       CARE PLAN ENTRY (see longitudinal plan of care for additional care plan information)  Current Barriers:  . Chronic Disease Management support, education, and care coordination needs related to Depression, Anxiety, and Vitamin D deficiency, Multiple Sclerosis, and prediabetes   Depression with Anxiety . Pharmacist Clinical Goal(s): o Over the next 90 days, patient will work with PharmD and providers to improve/control symptoms of anxiety and depression . Current regimen:  o Sertraline 50mg  1/2 tablet daily o Lorazepam 1mg  at bedtime . Interventions: o Discussed importance of not stopping sertraline suddenly even if patient feels she does not need it - Patient to discuss with doctor and taper off slowly if approved o Discussed timing of sertraline administration (nighttime dosing improves adherence) - Sleep problems started long before patient started taking sertraline . Patient self care activities - Over the next 90 days, patient will: o Continue medications daily as directed o Speak with doctor before discontinuing any medications  Prediabetes Lab Results  Component Value Date/Time   HGBA1C 5.9 (H) 11/22/2019 05:10 PM   . Pharmacist Clinical Goal(s): o Over the next 90 days, patient will work with PharmD and providers to maintain A1c goal <6.5% . Current regimen:  o Metformin 500mg  1/2 tablet daily with lunch . Interventions: o Comprehensive medication review o Discussed diet plan from healthy weight clinic o Commended on efforts thus far . Patient self care activities - Over the next 90 days, patient will: o Contact provider with any episodes of hypoglycemia o Focus on healthy diet and exercise  Multiple Sclerosis . Pharmacist Clinical Goal(s) o Over the next 180 days, patient will work with PharmD and providers to manage symptoms of multiple sclerosis . Current  regimen:  o N/A . Interventions: o Discussed various treatment options patient has tried for multiple sclerosis o Determined patient has not had worsening of symptoms in past 2 years (followed closely by Neurology) . Patient self care activities - Over the next 180 days, patient will: o Follow up with Neurologist as scheduled o Notify Neurologist of any worsening of symptoms   Vitamin D Deficiency . Pharmacist Clinical Goal(s) o Over the next 180 days, patient will work with PharmD and providers to maintain normal Vitamin D levels . Current regimen:  [name] Kitchen Vitamin D2 50,000 units twice weekly (Tuesday and Friday)  . Vitamin D3 2000 units daily . Interventions: o Determined patient has been taking both strengths of Vitamin D for about a year due to difficulty maintaining adequate Vitamin D levels o Encouraged her to continue medication as directed . Patient self care activities - Over the next 180 days, patient will: o Spend 15 minutes outside daily with sun protection o Continue Vitamin D supplements as directed  Please see past updates related to this goal by clicking on the "Past Updates" button in the selected goal         The patient verbalized understanding of instructions provided today and agreed to receive a mailed copy of patient instruction and/or educational materials.  Telephone follow up appointment with pharmacy team member scheduled for: 3 months  Beverly Milch, PharmD Clinical Pharmacist [facility] [phone]   BMI for Adults What is BMI? Body mass index (BMI) is a number that is calculated from a person's weight and height. BMI can help estimate how much of a  person's weight is composed of fat. BMI does not measure body fat directly. Rather, it is an alternative to procedures that directly measure body fat, which can be difficult and expensive. BMI can help identify people who may be at higher risk for certain medical problems. What are  BMI measurements used for? BMI is used as a screening tool to identify possible weight problems. It helps determine whether a person is obese, overweight, a healthy weight, or underweight. BMI is useful for:  Identifying a weight problem that may be related to a medical condition or may increase the risk for medical problems.  Promoting changes, such as changes in diet and exercise, to help reach a healthy weight. BMI screening can be repeated to see if these changes are working. How is BMI calculated? BMI involves measuring your weight in relation to your height. Both height and weight are measured, and the BMI is calculated from those numbers. This can be done either in [place] ([place] or metric measurements. Note that charts and online BMI calculators are available to help you find your BMI quickly and easily without having to do these calculations yourself. To calculate your BMI in English ([place].S[place] measurements:  1. Measure your weight in pounds (lb). 2. Multiply the number of pounds by 703. ? For example, for a person who weighs 180 lb, multiply that number by 703, which equals 126,540. 3. Measure your height in inches. Then multiply that number by itself to get a measurement called "inches squared." ? For example, for a person who is 70 inches tall, the "inches squared" measurement is 70 inches x 70 inches, which equals 4,900 inches squared. 4. Divide the total from step 2 (number of lb x 703) by the total from step 3 (inches squared): 126,540  4,900 = 25.8. This is your BMI. To calculate your BMI in metric measurements: 1. Measure your weight in kilograms (kg). 2. Measure your height in meters (m). Then multiply that number by itself to get a measurement called "meters squared." ? For example, for a person who is 1.75 m tall, the "meters squared" measurement is 1.75 m x 1.75 m, which is equal to 3.1 meters squared. 3. Divide the number of kilograms (your weight) by the meters squared  number. In this example: 70  3.1 = 22.6. This is your BMI. What do the results mean? BMI charts are used to identify whether you are underweight, normal weight, overweight, or obese. The following guidelines will be used:  Underweight: BMI less than 18.5.  Normal weight: BMI between 18.5 and 24.9.  Overweight: BMI between 25 and 29.9.  Obese: BMI of 30 or above. Keep these notes in mind:  Weight includes both fat and muscle, so someone with a muscular build, such as an athlete, may have a BMI that is higher than 24.9. In cases like these, BMI is not an accurate measure of body fat.  To determine if excess body fat is the cause of a BMI of 25 or higher, further assessments may need to be done by a health care provider.  BMI is usually interpreted in the same way for men and women. Where to find more information For more information about BMI, including tools to quickly calculate your BMI, go to these websites:  Centers for Disease Control and Prevention: http://www.wolf.info/  American Heart Association: www.heart.org  National Heart, Lung, and Blood Institute: https://wilson-eaton.com/ Summary  Body mass index (BMI) is a number that is calculated from a person's weight and height.  BMI may help estimate how much of a person's weight is composed of fat. BMI can help identify those who may be at higher risk for certain medical problems.  BMI can be measured using English measurements or metric measurements.  BMI charts are used to identify whether you are underweight, normal weight, overweight, or obese. This information is not intended to replace advice given to you by your health care provider. Make sure you discuss any questions you have with your health care provider. Document Revised: 01/17/2019 Document Reviewed: 11/24/2018 Elsevier Patient Education  [place].

## 2020-03-14 ENCOUNTER — Telehealth: Payer: Self-pay

## 2020-03-14 ENCOUNTER — Telehealth: Payer: Medicare Other

## 2020-03-14 NOTE — Telephone Encounter (Signed)
°  Chronic Care Management   Outreach Note  03/14/2020 Name: Grace [name] MRN: [id number] DOB: November 28, 1960  Referred by: [name], MD Reason for referral : Care Coordination   An unsuccessful telephone outreach was attempted today. The patient was referred to the case management team for assistance with care management and care coordination.   Follow Up Plan: A HIPAA compliant phone message was left for the patient providing contact information and requesting a return call.  The care management team will reach out to the patient again over the next 28 days.   Daneen Schick, BSW, CDP Social Worker, Certified Dementia Practitioner [facility] [phone]

## 2020-03-18 ENCOUNTER — Other Ambulatory Visit: Payer: Self-pay

## 2020-03-18 ENCOUNTER — Ambulatory Visit (INDEPENDENT_AMBULATORY_CARE_PROVIDER_SITE_OTHER): Payer: Medicare Other

## 2020-03-18 ENCOUNTER — Ambulatory Visit (INDEPENDENT_AMBULATORY_CARE_PROVIDER_SITE_OTHER): Payer: Medicare Other | Admitting: Psychology

## 2020-03-18 VITALS — BP 110/80 | HR 90 | Temp 98.2°F | Ht 67.0 in | Wt 210.6 lb

## 2020-03-18 DIAGNOSIS — F064 Anxiety disorder due to known physiological condition: Secondary | ICD-10-CM | POA: Diagnosis not present

## 2020-03-18 DIAGNOSIS — E538 Deficiency of other specified B group vitamins: Secondary | ICD-10-CM | POA: Diagnosis not present

## 2020-03-18 MED ORDER — CYANOCOBALAMIN 1000 MCG/ML IJ SOLN
1000.0000 ug | Freq: Once | INTRAMUSCULAR | Status: AC
  Administered 2020-03-18: 1000 ug via INTRAMUSCULAR

## 2020-03-18 NOTE — Progress Notes (Signed)
Patient is here today for her b12 injection.

## 2020-03-19 ENCOUNTER — Encounter (INDEPENDENT_AMBULATORY_CARE_PROVIDER_SITE_OTHER): Payer: Self-pay | Admitting: Family Medicine

## 2020-03-19 ENCOUNTER — Ambulatory Visit (INDEPENDENT_AMBULATORY_CARE_PROVIDER_SITE_OTHER): Payer: Medicare Other | Admitting: Family Medicine

## 2020-03-19 VITALS — BP 105/70 | HR 88 | Temp 98.1°F | Ht 67.0 in | Wt 207.0 lb

## 2020-03-19 DIAGNOSIS — E669 Obesity, unspecified: Secondary | ICD-10-CM | POA: Diagnosis not present

## 2020-03-19 DIAGNOSIS — E559 Vitamin D deficiency, unspecified: Secondary | ICD-10-CM

## 2020-03-19 DIAGNOSIS — Z6832 Body mass index (BMI) 32.0-32.9, adult: Secondary | ICD-10-CM | POA: Diagnosis not present

## 2020-03-19 DIAGNOSIS — Z9189 Other specified personal risk factors, not elsewhere classified: Secondary | ICD-10-CM | POA: Diagnosis not present

## 2020-03-19 MED ORDER — ERGOCALCIFEROL 1.25 MG (50000 UT) PO CAPS: ORAL_CAPSULE | ORAL | 0 refills | Status: DC

## 2020-03-20 ENCOUNTER — Telehealth (INDEPENDENT_AMBULATORY_CARE_PROVIDER_SITE_OTHER): Payer: Medicare Other | Admitting: Psychology

## 2020-03-20 DIAGNOSIS — F3289 Other specified depressive episodes: Secondary | ICD-10-CM

## 2020-03-25 ENCOUNTER — Telehealth: Payer: Medicare Other

## 2020-03-25 ENCOUNTER — Other Ambulatory Visit: Payer: Self-pay

## 2020-03-25 ENCOUNTER — Ambulatory Visit: Payer: Self-pay

## 2020-03-25 DIAGNOSIS — R7303 Prediabetes: Secondary | ICD-10-CM

## 2020-03-25 DIAGNOSIS — F418 Other specified anxiety disorders: Secondary | ICD-10-CM

## 2020-03-25 DIAGNOSIS — E538 Deficiency of other specified B group vitamins: Secondary | ICD-10-CM

## 2020-03-25 DIAGNOSIS — G35 Multiple sclerosis: Secondary | ICD-10-CM

## 2020-03-25 DIAGNOSIS — E6609 Other obesity due to excess calories: Secondary | ICD-10-CM

## 2020-03-25 DIAGNOSIS — H35351 Cystoid macular degeneration, right eye: Secondary | ICD-10-CM | POA: Insufficient documentation

## 2020-03-25 DIAGNOSIS — Z6834 Body mass index (BMI) 34.0-34.9, adult: Secondary | ICD-10-CM

## 2020-03-25 NOTE — Progress Notes (Signed)
Chief Complaint:   OBESITY Grace [name] is here to discuss her progress with her obesity treatment plan along with follow-up of her obesity related diagnoses. Grace [name] is on the Category 1 Plan and states she is following her eating plan approximately 90% of the time. Grace [name] states she is not exercising at this time.  Today's visit was #: 8 Starting weight: 221 lbs Starting date: 11/22/2019 Today's weight: 207 lbs Today's date: 03/19/2020 Total lbs lost to date: 14 lbs Total lbs lost since last in-office visit: 0 Total weight loss percentage to date: -6.33%  Interim History: Grace [name] says that at time she ate 2 meals instead of 3 meals per day on a couple of days, which is why she said she followed the plan 90% of the time, and not more.  Plan:  For breakfast, instead of the bread with cheese, she will have one [place] yogurt or 1/2 cup cottage cheese or 3 fully cooked Butter Ball [place] sausage links or 2 patties or 3 slices al fresco chicken bacon or Butter Ball [place] bacon.  Assessment/Plan:   Meds ordered this encounter  Medications  . REFILL ergocalciferol (VITAMIN D2) 1.25 MG (50000 UT) capsule    Sig: 1 po q wed and 1 po q sun    Dispense:  8 capsule    Refill:  0  No orders of the defined types were placed in this encounter.   1. Vitamin D deficiency Grace [name]'s Vitamin D level was 35.5 on 10/10/2019. She is currently taking prescription vitamin D 50,000 IU each week. She denies nausea, vomiting or muscle weakness.  Plan:  - Reiterated importance of vitamin D (as well as calcium) to their health and wellbeing.  - Reminded pt that weight loss will likely improve availability of vitamin D, thus encouraged Grace [name] to continue with meal plan and their weight loss efforts to further improve this condition. - I recommend pt continue to take weekly prescription vit D 50,000 IU - Informed patient this may be a lifelong thing, and she was encouraged to continue to take the medicine until told  otherwise.   - We will need to monitor levels regularly (every 3-4 mo on average) to keep levels within normal limits.  - pt's questions and concerns regarding this condition addressed.  Refill vitamin D 50,000 IU weekly.   2. At risk for osteoporosis Grace [name] was given approximately 9 minutes of osteoporosis prevention counseling today.  Grace [name] is at risk for osteopenia and osteoporosis due to Vitamin D deficiency, as well as other risk factors.  We discussed the importance of prudent screenings through her PCP's office for prevention.  Grace [name] was encouraged to take her Vitamin D and follow her calcium rich diet.  We will continue to monitor vitamin D levels to ensure treatment is appropriate.  It is recommended that she eventually engage in weight bearing exercises and muscle strengthening exercises to help improve bone density and decrease her risk of osteopenia and osteoporosis.   3. Class 1 obesity with serious comorbidity and body mass index (BMI) of 32.0 to 32.9 in adult, unspecified obesity type  Grace [name] is currently in the action stage of change. As such, her goal is to continue with weight loss efforts. She has agreed to the Category 1 Plan.   Exercise goals: All adults should avoid inactivity. Some physical activity is better than none, and adults who participate in any amount of physical activity gain some health benefits.  Behavioral modification strategies: meal planning and cooking strategies  and planning for success.  Grace [name] has agreed to follow-up with our clinic in 2 weeks. She was informed of the importance of frequent follow-up visits to maximize her success with intensive lifestyle modifications for her multiple health conditions.   Objective:   Blood pressure 105/70, pulse 88, temperature 98.1 F (36.7 C), height 5\' 7"  (1.702 m), weight 207 lb (93.9 kg), last menstrual period 04/09/2013, SpO2 97 %. Body mass index is 32.42 kg/m.  General: Cooperative, alert, well developed,  in no acute distress. HEENT: Conjunctivae and lids unremarkable. Cardiovascular: Regular rhythm.  Lungs: Normal work of breathing. Neurologic: No focal deficits.   Lab Results  Component Value Date   CREATININE 0.66 10/10/2019   BUN 12 10/10/2019   NA 143 10/10/2019   K 4.3 10/10/2019   CL 106 10/10/2019   CO2 21 10/10/2019   Lab Results  Component Value Date   ALT 31 10/10/2019   AST 32 10/10/2019   ALKPHOS 104 10/10/2019   BILITOT 0.6 10/10/2019   Lab Results  Component Value Date   HGBA1C 5.9 (H) 11/22/2019   Lab Results  Component Value Date   INSULIN 29.8 (H) 10/10/2019   Lab Results  Component Value Date   TSH 1.500 11/22/2019   Lab Results  Component Value Date   CHOL 205 (H) 11/22/2019   HDL 108 11/22/2019   LDLCALC 71 11/22/2019   TRIG 161 (H) 11/22/2019   CHOLHDL 1.9 11/22/2019   Lab Results  Component Value Date   WBC 10.3 11/22/2019   HGB 13.7 11/22/2019   HCT 40.3 11/22/2019   MCV 93 11/22/2019   PLT 275 11/22/2019   Attestation Statements:   Reviewed by clinician on day of visit: allergies, medications, problem list, medical history, surgical history, family history, social history, and previous encounter notes.  I, Water quality scientist, CMA, am acting as Location manager for [employer], DO.  I have reviewed the above documentation for accuracy and completeness, and I agree with the above. Marjory Sneddon, D.O.  The [place] was signed into law in 2016 which includes the topic of electronic health records.  This provides immediate access to information in MyChart.  This includes consultation notes, operative notes, office notes, lab results and pathology reports.  If you have any questions about what you read please let us know at your next visit so we can discuss your concerns and take corrective action if need be.  We are right here with you.

## 2020-03-27 NOTE — Patient Instructions (Addendum)
Visit Information  Goals Addressed      Patient Stated   .  "I am working to lose weight" (pt-stated)   On track     Grace [name] (see longitudinal plan of care for additional care plan information)  Current Barriers:  [name] Kitchen Knowledge Deficits related to disease process and Self Health management for improved Weight Management  . Chronic Disease Management support and education needs related to MS, Vitamin D Deficiency, Recurrent major depression, Class 1 Obesity due to excess calories, Prediabetes  Nurse Case Manager Clinical Goal(s):  [name] Kitchen Over the next 90 days, patient will work with the CCM team and PCP to address needs related to disease education and support for improved Weight Management   CCM RN CM Interventions:  03/25/20 call completed with patient  . Inter-disciplinary care team collaboration (see longitudinal plan of care) . Evaluation of current treatment plan related to Obesity and patient's adherence to plan as established by provider. . Provided education to patient re: dietary and exercise recommendations and how to get started: Determined patient is newly established with [facility] and is adhering to treatment recommendations . Discussed plans with patient for ongoing care management follow up and provided patient with direct contact information for care management team . Confirmed received and reviewed patient with printed educational materials related to How to get started with weight loss  Patient Self Care Activities:  . Self administers medications as prescribed . Attends all scheduled provider appointments . Calls pharmacy for medication refills . Performs ADL's independently . Performs IADL's independently . Calls provider office for new concerns or questions  Please see past updates related to this goal by clicking on the "Past Updates" button in the selected goal      .  "I would like to get my Cholesterol down" (pt-stated)   Not on track      Grace [name] (see longitudinal plan of care for additional care plan information)  Current Barriers:  [name] Kitchen Knowledge Deficits related to disease process and Self Health management of High Cholesterol . Chronic Disease Management support and education needs related to MS, Vitamin D Deficiency, Recurrent major depression, Class 1 Obesity due to excess calories, Prediabetes  Nurse Case Manager Clinical Goal(s):  [name] Kitchen Over the next 90 days, patient will work with the CCM team and PCP to address needs related to disease education and support for improved Self Health management of High Cholesterol  CCM RN CM Interventions:  03/25/20 call completed with patient  . Inter-disciplinary care team collaboration (see longitudinal plan of care) . Evaluation of current treatment plan related to Hyperlipidemia  and patient's adherence to plan as established by provider. . Provided education to patient re: current total Cholesterol is elevated to 205, Triglycerides 161; Educated on target ranges; Educated on dietary and exercise recommendations  . Reviewed medications with patient and discussed patient is not currently taking a prescription medication for high Cholesterol . Discussed plans with patient for ongoing care management follow up and provided patient with direct contact information for care management team . Confirmed patient received printed educational materials related to 13 Cholesterol-Lowering Foods  Patient Self Care Activities:  . Self administers medications as prescribed . Attends all scheduled provider appointments . Calls pharmacy for medication refills . Calls provider office for new concerns or questions  Please see past updates related to this goal by clicking on the "Past Updates" button in the selected goal        The  patient verbalized understanding of instructions, educational materials, and care plan provided today and declined offer to receive copy of patient  instructions, educational materials, and care plan.   Telephone follow up appointment with care management team member scheduled for: 04/21/20  Barb Merino, RN, BSN, CCM Care Management Coordinator [facility]  Direct Phone: [phone]

## 2020-03-27 NOTE — Chronic Care Management (AMB) (Addendum)
Chronic Care Management   Follow Up Note   03/25/2020 Name: Grace [name] MRN: [id number] DOB: 02/16/1961  Referred by: [name], MD Reason for referral : Chronic Care Management (RNCM FU Call )   Grace [name] is a 59 y.o. year old female who is a primary care patient of [name], MD. The CCM team was consulted for assistance with chronic disease management and care coordination needs.    Review of patient status, including review of consultants reports, relevant laboratory and other test results, and collaboration with appropriate care team members and the patient's provider was performed as part of comprehensive patient evaluation and provision of chronic care management services.    SDOH (Social Determinants of Health) assessments performed: Yes - home modifications, BSW to contact patient  See Care Plan activities for detailed interventions related to [place])   Placed outbound CCM RN CM follow up call to patient for a care plan update.    Outpatient Encounter Medications as of 03/25/2020  Medication Sig Note  . Ascorbic Acid (VITAMIN C) 1000 MG tablet Take 1,000 mg by mouth daily.   [name] Kitchen aspirin EC 81 MG tablet Take 81 mg by mouth daily.   . baclofen (LIORESAL) 10 MG tablet TAKE 2 TABLETS (20 MG TOTAL) BY MOUTH 3 (THREE) TIMES DAILY.   . brimonidine (ALPHAGAN) 0.2 % ophthalmic solution    . Cholecalciferol (VITAMIN D) 50 MCG (2000 UT) CAPS Take 2,000 Units by mouth daily.    [name] Kitchen CINNAMON PO Take 1,200 mg by mouth daily.    . diphenhydrAMINE (BENADRYL) 25 MG tablet Take 25 mg by mouth 3 (three) times daily. Takes with Hydrocodone   . doxepin (SINEQUAN) 10 MG capsule TAKE 1 TO 2 CAPSULES AT BEDTIME   . ELDERBERRY PO Take by mouth daily.   . ergocalciferol (VITAMIN D2) 1.25 MG (50000 UT) capsule 1 po q wed and 1 po q sun   . etodolac (LODINE) 500 MG tablet Take 500 mg by mouth. 1-2 times a day   . Evening Primrose Oil 1000 MG CAPS 1300mg  daily   . famotidine  (PEPCID) 20 MG tablet Take 1 tablet (20 mg total) by mouth 2 (two) times daily. (Patient taking differently: Take 20 mg by mouth daily. )   . Flaxseed, Linseed, (FLAX SEED OIL) 1000 MG CAPS Take 1,200 mg by mouth daily.    [name] Kitchen gabapentin (NEURONTIN) 300 MG capsule Take 1 capsule (300 mg total) by mouth 3 (three) times daily. (Patient taking differently: Take 300 mg by mouth 3 (three) times daily. Taking as needed) 03/08/2018: Rx placed up front for patient pick up 03/08/18 ELK,RN  . Grape Seed 50 MG CAPS Take 2 capsules by mouth daily.    [name] Kitchen HYDROcodone-acetaminophen (NORCO/VICODIN) 5-325 MG tablet Take 1 tablet by mouth 3 (three) times daily as needed for moderate pain.   [name] Kitchen ipratropium (ATROVENT) 0.06 % nasal spray Place 2 sprays into both nostrils 2 (two) times daily.   [name] Kitchen L-Lysine 1000 MG TABS Take by mouth. As needed   . Lactobacillus (PROBIOTIC ACIDOPHILUS PO) Take by mouth daily. Digestive Advantage   . levocetirizine (XYZAL) 5 MG tablet TAKE 1 TABLET BY MOUTH EVERY DAY IN THE EVENING   . loratadine-pseudoephedrine (QC LORATADINE-D) 10-240 MG 24 hr tablet Take 1 tablet by mouth daily.   [name] Kitchen LORazepam (ATIVAN) 1 MG tablet Take 1 tablet (1 mg total) by mouth at bedtime.   . Lutein-Bilberry (BILBERRY PLUS LUTEIN PO) Take by mouth daily.   [name] Kitchen  Melatonin 10 MG TABS Take 1 tablet by mouth at bedtime.    . metFORMIN (GLUCOPHAGE) 500 MG tablet 1 tab daily with lunch   . [place] 500 MG CAPS Take 2 capsules by mouth daily.   . Multiple Vitamins-Minerals (HAIR/SKIN/NAILS/BIOTIN) TABS Take by mouth daily.    . Netarsudil-Latanoprost (ROCKLATAN) 0.02-0.005 % SOLN Place 1 drop into the right eye at bedtime.   [name] Kitchen nystatin-triamcinolone ointment (MYCOLOG) Apply 1 application topically 2 (two) times daily.   . pilocarpine (PILOCAR) 1 % ophthalmic solution    . rizatriptan (MAXALT-MLT) 10 MG disintegrating tablet Take 1 tablet (10 mg total) by mouth as needed for migraine. May repeat in 2 hours if needed.  Max of 2  tablets in 24 hours, and 10 tablets per month   . temazepam (RESTORIL) 15 MG capsule Take 1 capsule (15 mg total) by mouth at bedtime as needed for sleep.   [name] Kitchen timolol (BETIMOL) 0.5 % ophthalmic solution Place 1 drop into the right eye 2 (two) times daily.   [name] Kitchen tiZANidine (ZANAFLEX) 2 MG tablet Take up to 4 pills a day   . vitamin E 1000 UNIT capsule Take 1,000 Units by mouth daily.    [name] Kitchen zolpidem (AMBIEN) 10 MG tablet Take 10 mg by mouth at bedtime as needed. For sleep    No facility-administered encounter medications on file as of 03/25/2020.     Objective:  Lab Results  Component Value Date   HGBA1C 5.9 (H) 11/22/2019   Lab Results  Component Value Date   LDLCALC 71 11/22/2019   CREATININE 0.66 10/10/2019   BP Readings from Last 3 Encounters:  03/19/20 105/70  03/18/20 110/80  03/03/20 115/74    Goals Addressed      Patient Stated   .  "I am working to lose weight" (pt-stated)   On track     [place] (see longitudinal plan of care for additional care plan information)  Current Barriers:  [name] Kitchen Knowledge Deficits related to disease process and Self Health management for improved Weight Management  . Chronic Disease Management support and education needs related to MS, Vitamin D Deficiency, Recurrent major depression, Class 1 Obesity due to excess calories, Prediabetes  Nurse Case Manager Clinical Goal(s):  [name] Kitchen Over the next 90 days, patient will work with the CCM team and PCP to address needs related to disease education and support for improved Weight Management   CCM RN CM Interventions:  03/25/20 call completed with patient  . Inter-disciplinary care team collaboration (see longitudinal plan of care) . Evaluation of current treatment plan related to Obesity and patient's adherence to plan as established by provider. . Provided education to patient re: dietary and exercise recommendations and how to get started: Determined patient is newly established with [facility] and is adhering to treatment recommendations . Discussed plans with patient for ongoing care management follow up and provided patient with direct contact information for care management team . Confirmed received and reviewed patient with printed educational materials related to How to get started with weight loss  Patient Self Care Activities:  . Self administers medications as prescribed . Attends all scheduled provider appointments . Calls pharmacy for medication refills . Performs ADL's independently . Performs IADL's independently . Calls provider office for new concerns or questions  Please see past updates related to this goal by clicking on the "Past Updates" button in the selected goal      .  "I would like to  get my Cholesterol down" (pt-stated)   Not on track     [place] (see longitudinal plan of care for additional care plan information)  Current Barriers:  [name] Kitchen Knowledge Deficits related to disease process and Self Health management of High Cholesterol . Chronic Disease Management support and education needs related to MS, Vitamin D Deficiency, Recurrent major depression, Class 1 Obesity due to excess calories, Prediabetes  Nurse Case Manager Clinical Goal(s):  [name] Kitchen Over the next 90 days, patient will work with the CCM team and PCP to address needs related to disease education and support for improved Self Health management of High Cholesterol  CCM RN CM Interventions:  03/25/20 call completed with patient  . Inter-disciplinary care team collaboration (see longitudinal plan of care) . Evaluation of current treatment plan related to Hyperlipidemia  and patient's adherence to plan as established by provider. . Provided education to patient re: current total Cholesterol is elevated to 205, Triglycerides 161; Educated on target ranges; Educated on dietary and exercise recommendations  . Reviewed medications with patient and discussed patient is not  currently taking a prescription medication for high Cholesterol . Discussed plans with patient for ongoing care management follow up and provided patient with direct contact information for care management team . Confirmed patient received printed educational materials related to 13 Cholesterol-Lowering Foods  Patient Self Care Activities:  . Self administers medications as prescribed . Attends all scheduled provider appointments . Calls pharmacy for medication refills . Calls provider office for new concerns or questions  Please see past updates related to this goal by clicking on the "Past Updates" button in the selected goal         Plan:   Telephone follow up appointment with care management team member scheduled for: 04/21/20  Barb Merino, RN, BSN, CCM Care Management Coordinator [facility]  Direct Phone: [phone]

## 2020-03-31 ENCOUNTER — Ambulatory Visit (INDEPENDENT_AMBULATORY_CARE_PROVIDER_SITE_OTHER): Payer: Medicare Other | Admitting: Psychology

## 2020-03-31 DIAGNOSIS — F064 Anxiety disorder due to known physiological condition: Secondary | ICD-10-CM

## 2020-04-02 ENCOUNTER — Ambulatory Visit (INDEPENDENT_AMBULATORY_CARE_PROVIDER_SITE_OTHER): Payer: Medicare Other | Admitting: Family Medicine

## 2020-04-02 ENCOUNTER — Encounter (INDEPENDENT_AMBULATORY_CARE_PROVIDER_SITE_OTHER): Payer: Self-pay | Admitting: Family Medicine

## 2020-04-02 ENCOUNTER — Other Ambulatory Visit: Payer: Self-pay

## 2020-04-02 VITALS — BP 108/69 | HR 85 | Temp 97.6°F | Ht 67.0 in | Wt 206.0 lb

## 2020-04-02 DIAGNOSIS — E559 Vitamin D deficiency, unspecified: Secondary | ICD-10-CM | POA: Diagnosis not present

## 2020-04-02 DIAGNOSIS — Z6832 Body mass index (BMI) 32.0-32.9, adult: Secondary | ICD-10-CM | POA: Diagnosis not present

## 2020-04-02 DIAGNOSIS — E669 Obesity, unspecified: Secondary | ICD-10-CM | POA: Diagnosis not present

## 2020-04-02 DIAGNOSIS — R7303 Prediabetes: Secondary | ICD-10-CM

## 2020-04-02 MED ORDER — METFORMIN HCL 500 MG PO TABS: ORAL_TABLET | ORAL | 0 refills | Status: DC

## 2020-04-07 NOTE — Progress Notes (Signed)
Chief Complaint:   OBESITY Grace [name] is here to discuss her progress with her obesity treatment plan along with follow-up of her obesity related diagnoses. Grace [name] is on the Category 1 Plan and states she is following her eating plan approximately 95% of the time. Grace [name] states she is not exercising at this time.  Today's visit was #: 9 Starting weight: 221 lbs Starting date: 11/22/2019 Today's weight: 206 lbs Today's date: 04/02/2020 Total lbs lost to date: 15 lbs Total lbs lost since last in-office visit: 1 lb Total weight loss percentage to date: -6.79%  Interim History: Grace [name] says that things are going well.  She says there were only 2 days where she did not eat all food and skipped a meal.  Food and meall recall appears accurate.   Assessment/Plan:   1. Prediabetes She will have her CPE with her PCP within the next 2 weeks.  They will obtain fasting blood work then.  She is currently taking metformin 250 mg with lunch.  Plan:  Increase metformin to 500 mg with lunch, as per below, to aid with nighttime snacking even more, especially going into the holidays.  -Increase and refill metFORMIN (GLUCOPHAGE) 500 MG tablet; 1 tab daily with lunch  Dispense: 30 tablet; Refill: 0  Lab Results  Component Value Date   HGBA1C 5.9 (H) 11/22/2019   Lab Results  Component Value Date   INSULIN 29.8 (H) 10/10/2019   2. Vitamin D deficiency Grace [name]'s Vitamin D level was 35.5 on 10/10/2019. She is currently taking OTC vitamin D 2,000 IU each day. She denies nausea, vomiting or muscle weakness.  Tolerating well with no side effects.  Plan:  Declines need for refill.  Continue medication as written.  Recheck level in 3-4 months or so.  3. Class 1 obesity with serious comorbidity and body mass index (BMI) of 32.0 to 32.9 in adult, unspecified obesity type  Grace [name] is currently in the action stage of change. As such, her goal is to continue with weight loss efforts. She has agreed to the Category 1  Plan.   Exercise goals: All adults should avoid inactivity. Some physical activity is better than none, and adults who participate in any amount of physical activity gain some health benefits.  Behavioral modification strategies: increasing lean protein intake, decreasing simple carbohydrates, meal planning and cooking strategies, holiday eating strategies (handouts given) and celebration eating strategies.  Grace [name] has agreed to follow-up with our clinic in 2-3 weeks. She was informed of the importance of frequent follow-up visits to maximize her success with intensive lifestyle modifications for her multiple health conditions.   Objective:   Blood pressure 108/69, pulse 85, temperature 97.6 F (36.4 C), height 5\' 7"  (1.702 m), weight 206 lb (93.4 kg), last menstrual period 04/09/2013, SpO2 97 %. Body mass index is 32.26 kg/m.  General: Cooperative, alert, well developed, in no acute distress. HEENT: Conjunctivae and lids unremarkable. Cardiovascular: Regular rhythm.  Lungs: Normal work of breathing. Neurologic: No focal deficits.   Lab Results  Component Value Date   CREATININE 0.66 10/10/2019   BUN 12 10/10/2019   NA 143 10/10/2019   K 4.3 10/10/2019   CL 106 10/10/2019   CO2 21 10/10/2019   Lab Results  Component Value Date   ALT 31 10/10/2019   AST 32 10/10/2019   ALKPHOS 104 10/10/2019   BILITOT 0.6 10/10/2019   Lab Results  Component Value Date   HGBA1C 5.9 (H) 11/22/2019   Lab Results  Component Value  Date   INSULIN 29.8 (H) 10/10/2019   Lab Results  Component Value Date   TSH 1.500 11/22/2019   Lab Results  Component Value Date   CHOL 205 (H) 11/22/2019   HDL 108 11/22/2019   LDLCALC 71 11/22/2019   TRIG 161 (H) 11/22/2019   CHOLHDL 1.9 11/22/2019   Lab Results  Component Value Date   WBC 10.3 11/22/2019   HGB 13.7 11/22/2019   HCT 40.3 11/22/2019   MCV 93 11/22/2019   PLT 275 11/22/2019   Obesity Behavioral Intervention:   Approximately 15  minutes were spent on the discussion below.  ASK: We discussed the diagnosis of obesity with Grace [name] today and Grace [name] agreed to give [place] permission to discuss obesity behavioral modification therapy today.  ASSESS: Grace [name] has the diagnosis of obesity and her BMI today is 32.3. Grace [name] is in the action stage of change.   ADVISE: Grace [name] was educated on the multiple health risks of obesity as well as the benefit of weight loss to improve her health. She was advised of the need for long term treatment and the importance of lifestyle modifications to improve her current health and to decrease her risk of future health problems.  AGREE: Multiple dietary modification options and treatment options were discussed and Grace [name] agreed to follow the recommendations documented in the above note.  ARRANGE: Grace [name] was educated on the importance of frequent visits to treat obesity as outlined per CMS and USPSTF guidelines and agreed to schedule her next follow up appointment today.  Attestation Statements:   Reviewed by clinician on day of visit: allergies, medications, problem list, medical history, surgical history, family history, social history, and previous encounter notes.  I, Water quality scientist, CMA, am acting as Location manager for [employer], DO.  I have reviewed the above documentation for accuracy and completeness, and I agree with the above. Marjory Sneddon, D.O.  The [place] was signed into law in 2016 which includes the topic of electronic health records.  This provides immediate access to information in MyChart.  This includes consultation notes, operative notes, office notes, lab results and pathology reports.  If you have any questions about what you read please let us know at your next visit so we can discuss your concerns and take corrective action if need be.  We are right here with you.

## 2020-04-09 ENCOUNTER — Telehealth: Payer: Medicare Other

## 2020-04-09 ENCOUNTER — Telehealth: Payer: Self-pay

## 2020-04-09 NOTE — Telephone Encounter (Signed)
°  Chronic Care Management   Outreach Note  04/09/2020 Name: Grace [name] MRN: [id number] DOB: 1961/02/20  Referred by: [name], MD Reason for referral : Care Coordination   A second unsuccessful telephone outreach was attempted today. The patient was referred to the case management team for assistance with care management and care coordination.   Follow Up Plan: A HIPAA compliant phone message was left for the patient providing contact information and requesting a return call.  The care management team will reach out to the patient again over the next 30 days.   Daneen Schick, BSW, CDP Social Worker, Certified Dementia Practitioner [place] [facility] [phone]

## 2020-04-15 ENCOUNTER — Encounter (INDEPENDENT_AMBULATORY_CARE_PROVIDER_SITE_OTHER): Payer: Self-pay | Admitting: Family Medicine

## 2020-04-15 ENCOUNTER — Ambulatory Visit (INDEPENDENT_AMBULATORY_CARE_PROVIDER_SITE_OTHER): Payer: Medicare Other | Admitting: Family Medicine

## 2020-04-15 ENCOUNTER — Other Ambulatory Visit: Payer: Self-pay | Admitting: *Deleted

## 2020-04-15 ENCOUNTER — Other Ambulatory Visit: Payer: Self-pay

## 2020-04-15 ENCOUNTER — Other Ambulatory Visit: Payer: Self-pay | Admitting: Obstetrics and Gynecology

## 2020-04-15 VITALS — BP 92/50 | HR 74 | Temp 97.7°F | Ht 67.0 in | Wt 206.0 lb

## 2020-04-15 DIAGNOSIS — E669 Obesity, unspecified: Secondary | ICD-10-CM | POA: Diagnosis not present

## 2020-04-15 DIAGNOSIS — R7303 Prediabetes: Secondary | ICD-10-CM

## 2020-04-15 DIAGNOSIS — E559 Vitamin D deficiency, unspecified: Secondary | ICD-10-CM

## 2020-04-15 DIAGNOSIS — Z6832 Body mass index (BMI) 32.0-32.9, adult: Secondary | ICD-10-CM | POA: Diagnosis not present

## 2020-04-15 DIAGNOSIS — Z9189 Other specified personal risk factors, not elsewhere classified: Secondary | ICD-10-CM | POA: Insufficient documentation

## 2020-04-15 DIAGNOSIS — E7849 Other hyperlipidemia: Secondary | ICD-10-CM | POA: Diagnosis not present

## 2020-04-15 DIAGNOSIS — E782 Mixed hyperlipidemia: Secondary | ICD-10-CM | POA: Insufficient documentation

## 2020-04-15 MED ORDER — BACLOFEN 10 MG PO TABS
20.0000 mg | ORAL_TABLET | Freq: Three times a day (TID) | ORAL | 11 refills | Status: DC
Start: 2020-04-15 — End: 2021-07-16

## 2020-04-15 MED ORDER — GABAPENTIN 300 MG PO CAPS
300.0000 mg | ORAL_CAPSULE | Freq: Three times a day (TID) | ORAL | 11 refills | Status: DC
Start: 2020-04-15 — End: 2020-07-16

## 2020-04-15 MED ORDER — HYDROCODONE-ACETAMINOPHEN 5-325 MG PO TABS
1.0000 | ORAL_TABLET | Freq: Three times a day (TID) | ORAL | 0 refills | Status: DC | PRN
Start: 2020-04-15 — End: 2020-06-05

## 2020-04-15 MED ORDER — ERGOCALCIFEROL 1.25 MG (50000 UT) PO CAPS: ORAL_CAPSULE | ORAL | 0 refills | Status: DC

## 2020-04-16 ENCOUNTER — Ambulatory Visit (INDEPENDENT_AMBULATORY_CARE_PROVIDER_SITE_OTHER): Payer: Medicare Other | Admitting: Internal Medicine

## 2020-04-16 ENCOUNTER — Encounter: Payer: Self-pay | Admitting: Internal Medicine

## 2020-04-16 VITALS — BP 132/84 | HR 89 | Temp 97.5°F | Ht 67.0 in | Wt 211.0 lb

## 2020-04-16 DIAGNOSIS — R350 Frequency of micturition: Secondary | ICD-10-CM | POA: Diagnosis not present

## 2020-04-16 DIAGNOSIS — S91331A Puncture wound without foreign body, right foot, initial encounter: Secondary | ICD-10-CM | POA: Diagnosis not present

## 2020-04-16 DIAGNOSIS — D518 Other vitamin B12 deficiency anemias: Secondary | ICD-10-CM | POA: Diagnosis not present

## 2020-04-16 DIAGNOSIS — E66811 Obesity, class 1: Secondary | ICD-10-CM

## 2020-04-16 DIAGNOSIS — R0789 Other chest pain: Secondary | ICD-10-CM | POA: Diagnosis not present

## 2020-04-16 DIAGNOSIS — Z6833 Body mass index (BMI) 33.0-33.9, adult: Secondary | ICD-10-CM

## 2020-04-16 DIAGNOSIS — E6609 Other obesity due to excess calories: Secondary | ICD-10-CM

## 2020-04-16 DIAGNOSIS — Z23 Encounter for immunization: Secondary | ICD-10-CM | POA: Diagnosis not present

## 2020-04-16 DIAGNOSIS — R7303 Prediabetes: Secondary | ICD-10-CM

## 2020-04-16 DIAGNOSIS — Z0000 Encounter for general adult medical examination without abnormal findings: Secondary | ICD-10-CM | POA: Diagnosis not present

## 2020-04-16 DIAGNOSIS — E559 Vitamin D deficiency, unspecified: Secondary | ICD-10-CM

## 2020-04-16 LAB — POCT URINALYSIS DIPSTICK
Blood, UA: NEGATIVE
Glucose, UA: NEGATIVE
Ketones, UA: NEGATIVE
Leukocytes, UA: NEGATIVE
Nitrite, UA: NEGATIVE
Protein, UA: NEGATIVE
Spec Grav, UA: <=1.005 — AB (ref 1.010–1.025)
Urobilinogen, UA: 0.2 E.U./dL
pH, UA: 5.5 (ref 5.0–8.0)

## 2020-04-16 MED ORDER — TETANUS-DIPHTH-ACELL PERTUSSIS 5-2.5-18.5 LF-MCG/0.5 IM SUSY
0.5000 mL | PREFILLED_SYRINGE | Freq: Once | INTRAMUSCULAR | Status: AC
  Administered 2020-04-16: 0.5 mL via INTRAMUSCULAR

## 2020-04-16 MED ORDER — CYANOCOBALAMIN 1000 MCG/ML IJ SOLN
1000.0000 ug | Freq: Once | INTRAMUSCULAR | Status: AC
  Administered 2020-04-16: 1000 ug via INTRAMUSCULAR

## 2020-04-16 NOTE — Patient Instructions (Signed)
Health Maintenance, Female Adopting a healthy lifestyle and getting preventive care are important in promoting health and wellness. Ask your health care provider about:  The right schedule for you to have regular tests and exams.  Things you can do on your own to prevent diseases and keep yourself healthy. What should I know about diet, weight, and exercise? Eat a healthy diet   Eat a diet that includes plenty of vegetables, fruits, low-fat dairy products, and lean protein.  Do not eat a lot of foods that are high in solid fats, added sugars, or sodium. Maintain a healthy weight Body mass index (BMI) is used to identify weight problems. It estimates body fat based on height and weight. Your health care provider can help determine your BMI and help you achieve or maintain a healthy weight. Get regular exercise Get regular exercise. This is one of the most important things you can do for your health. Most adults should:  Exercise for at least 150 minutes each week. The exercise should increase your heart rate and make you sweat (moderate-intensity exercise).  Do strengthening exercises at least twice a week. This is in addition to the moderate-intensity exercise.  Spend less time sitting. Even light physical activity can be beneficial. Watch cholesterol and blood lipids Have your blood tested for lipids and cholesterol at 57 years of age, then have this test every 5 years. Have your cholesterol levels checked more often if:  Your lipid or cholesterol levels are high.  You are older than 57 years of age.  You are at high risk for heart disease. What should I know about cancer screening? Depending on your health history and family history, you may need to have cancer screening at various ages. This may include screening for:  Breast cancer.  Cervical cancer.  Colorectal cancer.  Skin cancer.  Lung cancer. What should I know about heart disease, diabetes, and high blood  pressure? Blood pressure and heart disease  High blood pressure causes heart disease and increases the risk of stroke. This is more likely to develop in people who have high blood pressure readings, are of African descent, or are overweight.  Have your blood pressure checked: ? Every 3-5 years if you are 18-39 years of age. ? Every year if you are 40 years old or older. Diabetes Have regular diabetes screenings. This checks your fasting blood sugar level. Have the screening done:  Once every three years after age 40 if you are at a normal weight and have a low risk for diabetes.  More often and at a younger age if you are overweight or have a high risk for diabetes. What should I know about preventing infection? Hepatitis B If you have a higher risk for hepatitis B, you should be screened for this virus. Talk with your health care provider to find out if you are at risk for hepatitis B infection. Hepatitis C Testing is recommended for:  Everyone born from 1945 through 1965.  Anyone with known risk factors for hepatitis C. Sexually transmitted infections (STIs)  Get screened for STIs, including gonorrhea and chlamydia, if: ? You are sexually active and are younger than 57 years of age. ? You are older than 57 years of age and your health care provider tells you that you are at risk for this type of infection. ? Your sexual activity has changed since you were last screened, and you are at increased risk for chlamydia or gonorrhea. Ask your health care provider if   you are at risk.  Ask your health care provider about whether you are at high risk for HIV. Your health care provider may recommend a prescription medicine to help prevent HIV infection. If you choose to take medicine to prevent HIV, you should first get tested for HIV. You should then be tested every 3 months for as long as you are taking the medicine. Pregnancy  If you are about to stop having your period (premenopausal) and  you may become pregnant, seek counseling before you get pregnant.  Take 400 to 800 micrograms (mcg) of folic acid every day if you become pregnant.  Ask for birth control (contraception) if you want to prevent pregnancy. Osteoporosis and menopause Osteoporosis is a disease in which the bones lose minerals and strength with aging. This can result in bone fractures. If you are 65 years old or older, or if you are at risk for osteoporosis and fractures, ask your health care provider if you should:  Be screened for bone loss.  Take a calcium or vitamin D supplement to lower your risk of fractures.  Be given hormone replacement therapy (HRT) to treat symptoms of menopause. Follow these instructions at home: Lifestyle  Do not use any products that contain nicotine or tobacco, such as cigarettes, e-cigarettes, and chewing tobacco. If you need help quitting, ask your health care provider.  Do not use street drugs.  Do not share needles.  Ask your health care provider for help if you need support or information about quitting drugs. Alcohol use  Do not drink alcohol if: ? Your health care provider tells you not to drink. ? You are pregnant, may be pregnant, or are planning to become pregnant.  If you drink alcohol: ? Limit how much you use to 0-1 drink a day. ? Limit intake if you are breastfeeding.  Be aware of how much alcohol is in your drink. In the [place] one drink equals one 12 oz bottle of beer (355 mL), one 5 oz glass of wine (148 mL), or one 1 oz glass of hard liquor (44 mL). General instructions  Schedule regular health, dental, and eye exams.  Stay current with your vaccines.  Tell your health care provider if: ? You often feel depressed. ? You have ever been abused or do not feel safe at home. Summary  Adopting a healthy lifestyle and getting preventive care are important in promoting health and wellness.  Follow your health care provider's instructions about healthy  diet, exercising, and getting tested or screened for diseases.  Follow your health care provider's instructions on monitoring your cholesterol and blood pressure. This information is not intended to replace advice given to you by your health care provider. Make sure you discuss any questions you have with your health care provider. Document Revised: 04/19/2018 Document Reviewed: 04/19/2018 [employer]vier Patient Education  2020 [employer]  

## 2020-04-16 NOTE — Progress Notes (Signed)
I,[name],acting as a Education administrator for Grace Greenland, MD.,have documented all relevant documentation on the behalf of Grace Greenland, MD,as directed by  Grace Greenland, MD while in the presence of Grace Greenland, MD.  This visit occurred during the SARS-CoV-2 public health emergency.  Safety protocols were in place, including screening questions prior to the visit, additional usage of staff PPE, and extensive cleaning of exam room while observing appropriate contact time as indicated for disinfecting solutions.  Subjective:     Patient ID: Grace [name] , female    DOB: 08-04-1960 , 59 y.o.   MRN: [id number]   Chief Complaint  Patient presents with  . Annual Exam    HPI  She is here today for a full physical exam. She is followed by Dr. Hazle Coca for her GYN care. Her last pap was Feb 2021. Her last mammogram was January 2021. She has no new concerns today.     Past Medical History:  Diagnosis Date  . Allergy   . Anxiety   . Avascular necrosis ([place])   . Back pain   . Cancer ([place])   . Chest pain   . Chewing difficulty   . Chronic fatigue syndrome   . Colon polyp    pre-cancerous  . Complication of anesthesia    patient states she requires more anesthesia  . Depression   . Disorder of soft tissue   . Diverticulosis   . Dizziness   . Edema, lower extremity   . Fatty liver   . Fibromyalgia   . GERD (gastroesophageal reflux disease)   . H/O breast biopsy    pre- cancerous cells  . Hand fracture, left    9/87  . Heart palpitations   . Heart valve disease   . History of Holter monitoring   . Hyperlipidemia   . Insomnia   . Joint pain   . Lactose intolerance   . Lhermitte's syndrome   . Migraines   . Multiple food allergies   . Multiple sclerosis ([place])    dx in 11/1996  . Multiple sclerosis ([place])   . Neuromuscular disorder ([place])   . Neuropathy   . Numbness and tingling in hands   . Obesity   . Optic neuritis   . OSA (obstructive sleep apnea)    no  longer have OSA since stopped taking a MS medication  . Osteoarthritis   . Otitis media   . Palpitations   . Prediabetes   . Raynaud's phenomenon   . Shortness of breath   . Sleep apnea    Due to medication that she no longer takes.  . Swallowing difficulty   . Vaginitis and vulvovaginitis   . Vitamin B12 deficiency   . Vitamin D deficiency      Family History  Problem Relation Age of Onset  . Pancreatic cancer Mother   . Stroke Mother   . Hypertension Mother   . Hyperlipidemia Mother   . Heart disease Mother   . Cancer Mother   . Depression Mother   . Anxiety disorder Mother   . Alcoholism Mother   . Testicular cancer Brother   . Colon cancer Maternal Uncle   . Pancreatic cancer Paternal Uncle   . Prostate cancer Maternal Grandfather   . Colon polyps Father   . Diabetes Father   . Hypertension Father   . Hyperlipidemia Father   . Kidney disease Father   . Sleep apnea Father   . Obesity Father  Current Outpatient Medications:  .  Ascorbic Acid (VITAMIN C) 1000 MG tablet, Take 1,000 mg by mouth daily., Disp: , Rfl:  .  aspirin EC 81 MG tablet, Take 81 mg by mouth daily., Disp: , Rfl:  .  baclofen (LIORESAL) 10 MG tablet, Take 2 tablets (20 mg total) by mouth 3 (three) times daily., Disp: 180 tablet, Rfl: 11 .  Cholecalciferol (VITAMIN D) 50 MCG (2000 UT) CAPS, Take 2,000 Units by mouth daily. , Disp: , Rfl:  .  CINNAMON PO, Take 1,200 mg by mouth daily. , Disp: , Rfl:  .  diphenhydrAMINE (BENADRYL) 25 MG tablet, Take 25 mg by mouth 3 (three) times daily. Takes with Hydrocodone, Disp: , Rfl:  .  doxepin (SINEQUAN) 10 MG capsule, TAKE 1 TO 2 CAPSULES AT BEDTIME, Disp: 180 capsule, Rfl: 3 .  ELDERBERRY PO, Take by mouth daily., Disp: , Rfl:  .  ergocalciferol (VITAMIN D2) 1.25 MG (50000 UT) capsule, 1 po q wed and 1 po q sun, Disp: 8 capsule, Rfl: 0 .  etodolac (LODINE) 500 MG tablet, Take 500 mg by mouth. 1-2 times a day, Disp: , Rfl:  .  Evening Primrose Oil 1000  MG CAPS, 1387m daily, Disp: , Rfl:  .  famotidine (PEPCID) 20 MG tablet, Take 1 tablet (20 mg total) by mouth 2 (two) times daily. (Patient taking differently: Take 20 mg by mouth daily.), Disp: 180 tablet, Rfl: 1 .  Flaxseed, Linseed, (FLAX SEED OIL) 1000 MG CAPS, Take 1,200 mg by mouth daily. , Disp: , Rfl:  .  gabapentin (NEURONTIN) 300 MG capsule, Take 1 capsule (300 mg total) by mouth 3 (three) times daily., Disp: 90 capsule, Rfl: 11 .  Grape Seed 50 MG CAPS, Take 2 capsules by mouth daily. , Disp: , Rfl:  .  HYDROcodone-acetaminophen (NORCO/VICODIN) 5-325 MG tablet, Take 1 tablet by mouth 3 (three) times daily as needed for moderate pain., Disp: 90 tablet, Rfl: 0 .  ipratropium (ATROVENT) 0.06 % nasal spray, Place 2 sprays into both nostrils 2 (two) times daily., Disp: , Rfl:  .  L-Lysine 1000 MG TABS, Take by mouth. As needed, Disp: , Rfl:  .  Lactobacillus (PROBIOTIC ACIDOPHILUS PO), Take by mouth daily. Digestive Advantage, Disp: , Rfl:  .  levocetirizine (XYZAL) 5 MG tablet, TAKE 1 TABLET BY MOUTH EVERY DAY IN THE EVENING, Disp: 90 tablet, Rfl: 2 .  loratadine-pseudoephedrine (QC LORATADINE-D) 10-240 MG 24 hr tablet, Take 1 tablet by mouth daily., Disp: 90 tablet, Rfl: 2 .  LORazepam (ATIVAN) 1 MG tablet, Take 1 tablet (1 mg total) by mouth at bedtime., Disp: 30 tablet, Rfl: 5 .  Lutein-Bilberry (BILBERRY PLUS LUTEIN PO), Take by mouth daily., Disp: , Rfl:  .  Melatonin 10 MG TABS, Take 1 tablet by mouth at bedtime. , Disp: , Rfl:  .  metFORMIN (GLUCOPHAGE) 500 MG tablet, 1 tab daily with lunch, Disp: 30 tablet, Rfl: 0 .  Moringa 500 MG CAPS, Take 2 capsules by mouth daily., Disp: , Rfl:  .  moxifloxacin (VIGAMOX) 0.5 % ophthalmic solution, Place into the right eye in the morning and at bedtime., Disp: , Rfl:  .  Multiple Vitamins-Minerals (HAIR/SKIN/NAILS/BIOTIN) TABS, Take by mouth daily. , Disp: , Rfl:  .  nystatin-triamcinolone ointment (MYCOLOG), Apply 1 application topically 2  (two) times daily., Disp: 30 g, Rfl: 2 .  prednisoLONE acetate (PRED FORTE) 1 % ophthalmic suspension, Place 1 drop into the right eye 4 times daily., Disp: , Rfl:  .  rizatriptan (MAXALT-MLT) 10 MG disintegrating tablet, Take 1 tablet (10 mg total) by mouth as needed for migraine. May repeat in 2 hours if needed.  Max of 2 tablets in 24 hours, and 10 tablets per month, Disp: 10 tablet, Rfl: 11 .  temazepam (RESTORIL) 15 MG capsule, Take 1 capsule (15 mg total) by mouth at bedtime as needed for sleep., Disp: 30 capsule, Rfl: 2 .  tiZANidine (ZANAFLEX) 2 MG tablet, Take up to 4 pills a day, Disp: 120 tablet, Rfl: 11 .  vitamin E 1000 UNIT capsule, Take 1,000 Units by mouth daily. , Disp: , Rfl:  .  zolpidem (AMBIEN) 10 MG tablet, Take 10 mg by mouth at bedtime as needed. For sleep, Disp: , Rfl:    Allergies  Allergen Reactions  . Lamictal [Lamotrigine] Shortness Of Breath, Itching and Swelling    Tongue swells  . Mobic [Meloxicam] Anaphylaxis  . Ultram [Tramadol Hcl] Anaphylaxis  . Codeine Itching  . Diclofenac Itching  . Latex     Latex band-aids cause redness and tears your skin  . Nitrofurantoin     Causing itching all over, tongue swelling, chest heaviness, swollen lips, cough, rapid HR, swollen eyelids, red eyes/gums, lips red/white per pt.  . Teriflunomide Diarrhea    Hair loss, joint pains, elevated liver enzymes abugio (joint pains and liver issues)  . Trazodone And Nefazodone     Mouth sores, tongue swelling  . Tape Rash    The patient states she uses none for birth control. Last LMP was Patient's last menstrual period was 04/09/2013.. Negative for Dysmenorrhea. Negative for: breast discharge, breast lump(s), breast pain and breast self exam. Associated symptoms include abnormal vaginal bleeding. Pertinent negatives include abnormal bleeding (hematology), anxiety, decreased libido, depression, difficulty falling sleep, dyspareunia, history of infertility, nocturia, sexual  dysfunction, sleep disturbances, urinary incontinence, urinary urgency, vaginal discharge and vaginal itching. Diet regular.The patient states her exercise level is  minimal.   . The patient's tobacco use is:  Social History   Tobacco Use  Smoking Status Former Smoker  . Packs/day: 0.25  . Years: 8.00  . Pack years: 2.00  . Types: Cigarettes  . Quit date: 07/01/1995  . Years since quitting: 24.8  Smokeless Tobacco Never Used  . She has been exposed to passive smoke. The patient's alcohol use is:  Social History   Substance and Sexual Activity  Alcohol Use No  . Alcohol/week: 0.0 standard drinks     Review of Systems  Constitutional: Negative.   HENT: Negative.   Eyes: Negative.   Respiratory: Negative.   Cardiovascular: Positive for chest pain.  Gastrointestinal: Negative.   Endocrine: Negative.   Genitourinary: Positive for frequency.  Musculoskeletal: Negative.   Skin: Negative.        States she stepped on screw yesterday, 12/7. States rusty screw hit her on right side of foot. No pain with ambulation. No drainage.   Allergic/Immunologic: Negative.   Neurological: Negative.   Hematological: Negative.   Psychiatric/Behavioral: Negative.      Today's Vitals   04/16/20 1542  BP: 132/84  Pulse: 89  Temp: (!) 97.5 F (36.4 C)  TempSrc: Oral  Weight: 211 lb (95.7 kg)  Height: 5' 7"  (1.702 m)  PainSc: 6   PainLoc: Chest   Body mass index is 33.05 kg/m.  Wt Readings from Last 3 Encounters:  04/30/20 208 lb (94.3 kg)  04/16/20 211 lb (95.7 kg)  04/15/20 206 lb (93.4 kg)   Objective:  Physical Exam Vitals and  nursing note reviewed.  Constitutional:      General: She is not in acute distress.    Appearance: Normal appearance. She is well-developed.  HENT:     Head: Normocephalic and atraumatic.     Right Ear: Hearing, tympanic membrane, ear canal and external ear normal. There is no impacted cerumen.     Left Ear: Hearing, tympanic membrane, ear canal and  external ear normal. There is no impacted cerumen.     Nose:     Comments: Deferred, masked    Mouth/Throat:     Comments: Deferred, masked Eyes:     General: Lids are normal. Vision grossly intact.     Extraocular Movements: Extraocular movements intact.     Conjunctiva/sclera: Conjunctivae normal.     Comments: Ptosis on right - pt states this is a result of "wrinkle" surgery  Neck:     Thyroid: No thyroid mass.     Vascular: No carotid bruit.  Cardiovascular:     Rate and Rhythm: Normal rate and regular rhythm.     Pulses: Normal pulses.     Heart sounds: Normal heart sounds. No murmur heard.   Pulmonary:     Effort: Pulmonary effort is normal.     Breath sounds: Normal breath sounds.  Chest:  Breasts:     Tanner Score is 5.     Right: Normal.     Left: Normal.    Abdominal:     General: Bowel sounds are normal. There is no distension.     Palpations: Abdomen is soft.     Tenderness: There is no abdominal tenderness.  Genitourinary:    Comments: Deferred Musculoskeletal:        General: No swelling. Normal range of motion.     Cervical back: Full passive range of motion without pain, normal range of motion and neck supple.     Right lower leg: No edema.     Left lower leg: No edema.  Feet:     Comments: Puncture wound right foot, no surrounding erythema Skin:    General: Skin is warm and dry.     Capillary Refill: Capillary refill takes less than 2 seconds.  Neurological:     General: No focal deficit present.     Mental Status: She is alert and oriented to person, place, and time.     Cranial Nerves: No cranial nerve deficit.     Sensory: No sensory deficit.  Psychiatric:        Mood and Affect: Mood normal.        Behavior: Behavior normal.        Thought Content: Thought content normal.        Judgment: Judgment normal.         Assessment And Plan:     1. Routine general medical examination at health care facility Comments: A full exam was  performed. Importance of monthly self breast exams was discussed with the patient. PATIENT IS ADVISED TO GET 30-45 MINUTES REGULAR EXERCISE NO LESS THAN FOUR TO FIVE DAYS PER WEEK - BOTH WEIGHTBEARING EXERCISES AND AEROBIC ARE RECOMMENDED.  PATIENT IS ADVISED TO FOLLOW A HEALTHY DIET WITH AT LEAST SIX FRUITS/VEGGIES PER DAY, DECREASE INTAKE OF RED MEAT, AND TO INCREASE FISH INTAKE TO TWO DAYS PER WEEK.  MEATS/FISH SHOULD NOT BE FRIED, BAKED OR BROILED IS PREFERABLE.  I SUGGEST WEARING SPF 50 SUNSCREEN ON EXPOSED PARTS AND ESPECIALLY WHEN IN THE DIRECT SUNLIGHT FOR AN EXTENDED PERIOD OF TIME.  PLEASE  AVOID FAST FOOD RESTAURANTS AND INCREASE YOUR WATER INTAKE.  2. Atypical chest pain Comments: I think her sx are musculoskeletal in nature. However, EKG performed, NSR w/o acute changes.  - CMP14+EGFR - EKG 12-Lead - Magnesium  3. Prediabetes Comments: Her a1c has been elevated in the past, I will check repeat a1c today. Encouraged to avoid sugary beverages, including diet drinks.  - Hemoglobin A1c - Magnesium  4. Other vitamin B12 deficiency anemia Comments: I will check vitamin B12 level and supplement as needed.  - cyanocobalamin ((VITAMIN B-12)) injection 1,000 mcg  5. Vitamin D deficiency disease Comments: I will check a vitamin D level and supplement as needed.  - CMP14+EGFR - VITAMIN D 25 Hydroxy (Vit-D Deficiency, Fractures)  6. Puncture wound of right foot, initial encounter Comments: Area of concern was evaluated, no erythema noted.  Advised to keep area clean/dry.  She was also given tetanus booster, last one was 2013.  - Tdap (BOOSTRIX) injection 0.5 mL  7. Frequency of urination Comments: I will check urinalysis today.  - POCT Urinalysis Dipstick (81002)  8. Class 1 obesity due to excess calories with serious comorbidity and body mass index (BMI) of 33.0 to 33.9 in adult She is currently followed by [facility].  She is encouraged to strive for BMI less than 30 to decrease  cardiac risk. Advised to aim for at least 150 minutes of exercise per week.  Patient was given opportunity to ask questions. Patient verbalized understanding of the plan and was able to repeat key elements of the plan. All questions were answered to their satisfaction.  Grace Greenland, MD   I, Grace Greenland, MD, have reviewed all documentation for this visit. The documentation on 05/10/20 for the exam, diagnosis, procedures, and orders are all accurate and complete.  THE PATIENT IS ENCOURAGED TO PRACTICE SOCIAL DISTANCING DUE TO THE COVID-19 PANDEMIC.

## 2020-04-16 NOTE — Progress Notes (Deleted)
Rutherford Nail as a scribe for [name], MD.,have documented all relevant documentation on the behalf of [name], MD,as directed by  [name], MD while in the presence of [name], MD. This visit occurred during the SARS-CoV-2 public health emergency.  Safety protocols were in place, including screening questions prior to the visit, additional usage of staff PPE, and extensive cleaning of exam room while observing appropriate contact time as indicated for disinfecting solutions.  Subjective:     Patient ID: Grace [name] , female    DOB: 1960-09-10 , 59 y.o.   MRN: [id number]   Chief Complaint  Patient presents with  . Annual Exam    HPI  She is here today for a full physical exam.     Past Medical History:  Diagnosis Date  . Allergy   . Anxiety   . Avascular necrosis ([place])   . Back pain   . Cancer ([place])   . Chest pain   . Chewing difficulty   . Chronic fatigue syndrome   . Colon polyp    pre-cancerous  . Complication of anesthesia    patient states she requires more anesthesia  . Depression   . Disorder of soft tissue   . Diverticulosis   . Dizziness   . Edema, lower extremity   . Fatty liver   . Fibromyalgia   . GERD (gastroesophageal reflux disease)   . H/O breast biopsy    pre- cancerous cells  . Hand fracture, left    9/87  . Heart palpitations   . Heart valve disease   . History of Holter monitoring   . Hyperlipidemia   . Insomnia   . Joint pain   . Lactose intolerance   . Lhermitte's syndrome   . Migraines   . Multiple food allergies   . Multiple sclerosis ([place])    dx in 11/1996  . Multiple sclerosis ([place])   . Neuromuscular disorder ([place])   . Neuropathy   . Numbness and tingling in hands   . Obesity   . Optic neuritis   . OSA (obstructive sleep apnea)    no longer have OSA since stopped taking a MS medication  . Osteoarthritis   . Otitis media   . Palpitations   . Prediabetes   . Raynaud's  phenomenon   . Shortness of breath   . Sleep apnea    Due to medication that she no longer takes.  . Swallowing difficulty   . Vaginitis and vulvovaginitis   . Vitamin B12 deficiency   . Vitamin D deficiency      Family History  Problem Relation Age of Onset  . Pancreatic cancer Mother   . Stroke Mother   . Hypertension Mother   . Hyperlipidemia Mother   . Heart disease Mother   . Cancer Mother   . Depression Mother   . Anxiety disorder Mother   . Alcoholism Mother   . Testicular cancer Brother   . Colon cancer Maternal Uncle   . Pancreatic cancer Paternal Uncle   . Prostate cancer Maternal Grandfather   . Colon polyps Father   . Diabetes Father   . Hypertension Father   . Hyperlipidemia Father   . Kidney disease Father   . Sleep apnea Father   . Obesity Father      Current Outpatient Medications:  .  Ascorbic Acid (VITAMIN C) 1000 MG tablet, Take 1,000 mg by mouth daily., Disp: , Rfl:  .  aspirin EC 81 MG tablet, Take 81 mg by mouth daily., Disp: , Rfl:  .  baclofen (LIORESAL) 10 MG tablet, Take 2 tablets (20 mg total) by mouth 3 (three) times daily., Disp: 180 tablet, Rfl: 11 .  Cholecalciferol (VITAMIN D) 50 MCG (2000 UT) CAPS, Take 2,000 Units by mouth daily. , Disp: , Rfl:  .  CINNAMON PO, Take 1,200 mg by mouth daily. , Disp: , Rfl:  .  diphenhydrAMINE (BENADRYL) 25 MG tablet, Take 25 mg by mouth 3 (three) times daily. Takes with Hydrocodone, Disp: , Rfl:  .  doxepin (SINEQUAN) 10 MG capsule, TAKE 1 TO 2 CAPSULES AT BEDTIME, Disp: 180 capsule, Rfl: 3 .  ELDERBERRY PO, Take by mouth daily., Disp: , Rfl:  .  ergocalciferol (VITAMIN D2) 1.25 MG (50000 UT) capsule, 1 po q wed and 1 po q sun, Disp: 8 capsule, Rfl: 0 .  etodolac (LODINE) 500 MG tablet, Take 500 mg by mouth. 1-2 times a day, Disp: , Rfl:  .  Evening Primrose Oil 1000 MG CAPS, 1300mg  daily, Disp: , Rfl:  .  famotidine (PEPCID) 20 MG tablet, Take 1 tablet (20 mg total) by mouth 2 (two) times daily.  (Patient taking differently: Take 20 mg by mouth daily. ), Disp: 180 tablet, Rfl: 1 .  Flaxseed, Linseed, (FLAX SEED OIL) 1000 MG CAPS, Take 1,200 mg by mouth daily. , Disp: , Rfl:  .  gabapentin (NEURONTIN) 300 MG capsule, Take 1 capsule (300 mg total) by mouth 3 (three) times daily., Disp: 90 capsule, Rfl: 11 .  Grape Seed 50 MG CAPS, Take 2 capsules by mouth daily. , Disp: , Rfl:  .  HYDROcodone-acetaminophen (NORCO/VICODIN) 5-325 MG tablet, Take 1 tablet by mouth 3 (three) times daily as needed for moderate pain., Disp: 90 tablet, Rfl: 0 .  ipratropium (ATROVENT) 0.06 % nasal spray, Place 2 sprays into both nostrils 2 (two) times daily., Disp: , Rfl:  .  L-Lysine 1000 MG TABS, Take by mouth. As needed, Disp: , Rfl:  .  Lactobacillus (PROBIOTIC ACIDOPHILUS PO), Take by mouth daily. Digestive Advantage, Disp: , Rfl:  .  levocetirizine (XYZAL) 5 MG tablet, TAKE 1 TABLET BY MOUTH EVERY DAY IN THE EVENING, Disp: 90 tablet, Rfl: 2 .  loratadine-pseudoephedrine (QC LORATADINE-D) 10-240 MG 24 hr tablet, Take 1 tablet by mouth daily., Disp: 90 tablet, Rfl: 2 .  LORazepam (ATIVAN) 1 MG tablet, Take 1 tablet (1 mg total) by mouth at bedtime., Disp: 30 tablet, Rfl: 5 .  Lutein-Bilberry (BILBERRY PLUS LUTEIN PO), Take by mouth daily., Disp: , Rfl:  .  Melatonin 10 MG TABS, Take 1 tablet by mouth at bedtime. , Disp: , Rfl:  .  metFORMIN (GLUCOPHAGE) 500 MG tablet, 1 tab daily with lunch, Disp: 30 tablet, Rfl: 0 .  Moringa 500 MG CAPS, Take 2 capsules by mouth daily., Disp: , Rfl:  .  moxifloxacin (VIGAMOX) 0.5 % ophthalmic solution, Place 1 drop into the right eye 4 times daily., Disp: , Rfl:  .  Multiple Vitamins-Minerals (HAIR/SKIN/NAILS/BIOTIN) TABS, Take by mouth daily. , Disp: , Rfl:  .  nystatin-triamcinolone ointment (MYCOLOG), Apply 1 application topically 2 (two) times daily., Disp: 30 g, Rfl: 2 .  prednisoLONE acetate (PRED FORTE) 1 % ophthalmic suspension, Place 1 drop into the right eye 4 times  daily., Disp: , Rfl:  .  rizatriptan (MAXALT-MLT) 10 MG disintegrating tablet, Take 1 tablet (10 mg total) by mouth as needed for migraine. May repeat in 2 hours  if needed.  Max of 2 tablets in 24 hours, and 10 tablets per month, Disp: 10 tablet, Rfl: 11 .  temazepam (RESTORIL) 15 MG capsule, Take 1 capsule (15 mg total) by mouth at bedtime as needed for sleep., Disp: 30 capsule, Rfl: 2 .  tiZANidine (ZANAFLEX) 2 MG tablet, Take up to 4 pills a day, Disp: 120 tablet, Rfl: 11 .  vitamin E 1000 UNIT capsule, Take 1,000 Units by mouth daily. , Disp: , Rfl:  .  zolpidem (AMBIEN) 10 MG tablet, Take 10 mg by mouth at bedtime as needed. For sleep, Disp: , Rfl:    Allergies  Allergen Reactions  . Lamictal [Lamotrigine] Shortness Of Breath, Itching and Swelling    Tongue swells  . Mobic [Meloxicam] Anaphylaxis  . Ultram [Tramadol Hcl] Anaphylaxis  . Codeine Itching  . Diclofenac Itching  . Latex     Latex band-aids cause redness and tears your skin  . Nitrofurantoin     Causing itching all over, tongue swelling, chest heaviness, swollen lips, cough, rapid HR, swollen eyelids, red eyes/gums, lips red/white per pt.  . Teriflunomide Diarrhea    Hair loss, joint pains, elevated liver enzymes abugio (joint pains and liver issues)  . Trazodone And Nefazodone     Mouth sores, tongue swelling  . Tape Rash      The patient states she uses {contraceptive methods:5051} for birth control. Last LMP was Patient's last menstrual period was 04/09/2013.. {Dysmenorrhea-menorrhagia:21918}. Negative for: breast discharge, breast lump(s), breast pain and breast self exam. Associated symptoms include abnormal vaginal bleeding. Pertinent negatives include abnormal bleeding (hematology), anxiety, decreased libido, depression, difficulty falling sleep, dyspareunia, history of infertility, nocturia, sexual dysfunction, sleep disturbances, urinary incontinence, urinary urgency, vaginal discharge and vaginal itching. Diet  regular.The patient states her exercise level is    . The patient's tobacco use is:  Social History   Tobacco Use  Smoking Status Former Smoker  . Packs/day: 0.25  . Years: 8.00  . Pack years: 2.00  . Types: Cigarettes  . Quit date: 07/01/1995  . Years since quitting: 24.8  Smokeless Tobacco Never Used  . She has been exposed to passive smoke. The patient's alcohol use is:  Social History   Substance and Sexual Activity  Alcohol Use No  . Alcohol/week: 0.0 standard drinks  . Additional information: Last pap ***, next one scheduled for ***.    Review of Systems  Constitutional: Negative.  Negative for fatigue.  HENT: Negative.   Respiratory: Negative.   Endocrine: Negative for polydipsia, polyphagia and polyuria.  Musculoskeletal: Negative.   Skin: Negative.   Neurological: Negative for dizziness and headaches.  Psychiatric/Behavioral: Negative.      There were no vitals filed for this visit. There is no height or weight on file to calculate BMI.   Objective:  Physical Exam      Assessment And Plan:     There are no diagnoses linked to this encounter.    Patient was given opportunity to ask questions. Patient verbalized understanding of the plan and was able to repeat key elements of the plan. All questions were answered to their satisfaction.   Hassell Done, CMA   I, Hassell Done, CMA, have reviewed all documentation for this visit. The documentation on 04/16/20 for the exam, diagnosis, procedures, and orders are all accurate and complete.  THE PATIENT IS ENCOURAGED TO PRACTICE SOCIAL DISTANCING DUE TO THE COVID-19 PANDEMIC.

## 2020-04-16 NOTE — Progress Notes (Signed)
Chief Complaint:   OBESITY Mahogani is here to discuss her progress with her obesity treatment plan along with follow-up of her obesity related diagnoses. Ashelyn is on the Category 1 Plan and states she is following her eating plan approximately 85% of the time. Charlena states she is exercising 0 minutes 0 times per week.  Today's visit was #: 10 Starting weight: 221 lbs Starting date: 11/22/2019 Today's weight: 206 lbs Today's date: 04/15/2020 Total lbs lost to date: 15 lbs Total lbs lost since last in-office visit: 0 lbs Total weight loss percentage to date: -6.79%  Interim History: Gina is happy she did not gain weight. She reports 2 days she was unable to eat due to nausea and vomiting after eye surgery. Sunaina also had 2 Thanksgivings to celebrate and didn't eat off plan.  Plan: Ayza sees PCP tomorrow at 3 pm. She states she will have bloodwork done at that time.  Assessment/Plan:   1. Vitamin D deficiency Tianne's Vitamin D level was 35.5 on 10/10/2019. She is currently taking prescription vitamin D 50,000 IU each week. She denies nausea, vomiting or muscle weakness.  Plan: Refill Vit D for 1 month, as per below. Pt will get labs done tomorrow at PCP office. Low Vitamin D level contributes to fatigue and are associated with obesity, breast, and colon cancer. She agrees to continue to take prescription Vitamin D @50 ,000 IU every week and will follow-up for routine testing of Vitamin D, at least 2-3 times per year to avoid over-replacement.  Refill- ergocalciferol (VITAMIN D2) 1.25 MG (50000 UT) capsule; 1 po q wed and 1 po q sun  Dispense: 8 capsule; Refill: 0  2. Pre-diabetes At her last OV, we increased Jhaniya's Metformin from 250 mg to 500 mg. She is tolerating it well with no issues. Sharlisa is controlling sweets cravings at night.  Lauree has a diagnosis of prediabetes based on her elevated HgA1c and was informed this puts her at greater risk of developing diabetes. She continues to  work on diet and exercise to decrease her risk of diabetes. She denies nausea or hypoglycemia.  Lab Results  Component Value Date   HGBA1C 5.9 (H) 11/22/2019   Lab Results  Component Value Date   INSULIN 29.8 (H) 10/10/2019    Plan: Irva is getting labs at PCP office tomorrow. Continue Metformin at current dose. No need for increase, per pt.   3. Other hyperlipidemia with hypertriglyceridemia Bedie is not on medicaton for HLD, as this is diet controlled.  Keamber has hyperlipidemia and has been trying to improve her cholesterol levels with intensive lifestyle modification including a low saturated fat diet, exercise and weight loss. She denies any chest pain, claudication or myalgias.  Lab Results  Component Value Date   ALT 31 10/10/2019   AST 32 10/10/2019   ALKPHOS 104 10/10/2019   BILITOT 0.6 10/10/2019   Lab Results  Component Value Date   CHOL 205 (H) 11/22/2019   HDL 108 11/22/2019   LDLCALC 71 11/22/2019   TRIG 161 (H) 11/22/2019   CHOLHDL 1.9 11/22/2019   Plan: Marnell is getting labs done at PCP office tomorrow. Cardiovascular risk and specific lipid/LDL goals reviewed.  We discussed several lifestyle modifications today and Sharlene will continue to work on diet, exercise and weight loss efforts. Orders and follow up as documented in patient record.   Counseling Intensive lifestyle modifications are the first line treatment for this issue. . Dietary changes: Increase soluble fiber. Decrease simple carbohydrates. [name] Kitchen  Exercise changes: Moderate to vigorous-intensity aerobic activity 150 minutes per week if tolerated. . Lipid-lowering medications: see documented in medical record.  4. Class 1 obesity with serious comorbidity and body mass index (BMI) of 32.0 to 32.9 in adult, unspecified obesity type Karisha is currently in the action stage of change. As such, her goal is to continue with weight loss efforts. She has agreed to the Category 1 Plan.   Exercise goals: Start  walking 10 minutes 3 days a week.  Behavioral modification strategies: no skipping meals, celebration eating strategies and planning for success.  Charish has agreed to follow-up with our clinic in 2-4 weeks. She was informed of the importance of frequent follow-up visits to maximize her success with intensive lifestyle modifications for her multiple health conditions.   Objective:   Blood pressure (!) 92/50, pulse 74, temperature 97.7 F (36.5 C), height 5\' 7"  (1.702 m), weight 206 lb (93.4 kg), last menstrual period 04/09/2013, SpO2 97 %. Body mass index is 32.26 kg/m.  General: Cooperative, alert, well developed, in no acute distress. HEENT: Conjunctivae and lids unremarkable. Cardiovascular: Regular rhythm.  Lungs: Normal work of breathing. Neurologic: No focal deficits.   Lab Results  Component Value Date   CREATININE 0.66 10/10/2019   BUN 12 10/10/2019   NA 143 10/10/2019   K 4.3 10/10/2019   CL 106 10/10/2019   CO2 21 10/10/2019   Lab Results  Component Value Date   ALT 31 10/10/2019   AST 32 10/10/2019   ALKPHOS 104 10/10/2019   BILITOT 0.6 10/10/2019   Lab Results  Component Value Date   HGBA1C 5.9 (H) 11/22/2019   Lab Results  Component Value Date   INSULIN 29.8 (H) 10/10/2019   Lab Results  Component Value Date   TSH 1.500 11/22/2019   Lab Results  Component Value Date   CHOL 205 (H) 11/22/2019   HDL 108 11/22/2019   LDLCALC 71 11/22/2019   TRIG 161 (H) 11/22/2019   CHOLHDL 1.9 11/22/2019   Lab Results  Component Value Date   WBC 10.3 11/22/2019   HGB 13.7 11/22/2019   HCT 40.3 11/22/2019   MCV 93 11/22/2019   PLT 275 11/22/2019   Obesity Behavioral Intervention:   Approximately 15 minutes were spent on the discussion below.  ASK: We discussed the diagnosis of obesity with Tallie today and Tali agreed to give [place] permission to discuss obesity behavioral modification therapy today.  ASSESS: Morine has the diagnosis of obesity and her BMI  today is 32.4. Sherree is in the action stage of change.   ADVISE: Ethell was educated on the multiple health risks of obesity as well as the benefit of weight loss to improve her health. She was advised of the need for long term treatment and the importance of lifestyle modifications to improve her current health and to decrease her risk of future health problems.  AGREE: Multiple dietary modification options and treatment options were discussed and Raylen agreed to follow the recommendations documented in the above note.  ARRANGE: Missy was educated on the importance of frequent visits to treat obesity as outlined per CMS and USPSTF guidelines and agreed to schedule her next follow up appointment today.  Attestation Statements:   Reviewed by clinician on day of visit: allergies, medications, problem list, medical history, surgical history, family history, social history, and previous encounter notes.  Coral Ceo, am acting as Location manager for [employer], DO.  I have reviewed the above documentation for accuracy and completeness, and  I agree with the above. Marjory Sneddon, D.O.  The [place] was signed into law in 2016 which includes the topic of electronic health records.  This provides immediate access to information in MyChart.  This includes consultation notes, operative notes, office notes, lab results and pathology reports.  If you have any questions about what you read please let us know at your next visit so we can discuss your concerns and take corrective action if need be.  We are right here with you.

## 2020-04-17 LAB — MAGNESIUM: Magnesium: 2.1 mg/dL (ref 1.6–2.3)

## 2020-04-17 LAB — CMP14+EGFR
ALT: 24 IU/L (ref 0–32)
AST: 20 IU/L (ref 0–40)
Albumin/Globulin Ratio: 1.6 (ref 1.2–2.2)
Albumin: 4.7 g/dL (ref 3.8–4.9)
Alkaline Phosphatase: 111 IU/L (ref 44–121)
BUN/Creatinine Ratio: 21 (ref 9–23)
BUN: 15 mg/dL (ref 6–24)
Bilirubin Total: 0.5 mg/dL (ref 0.0–1.2)
CO2: 23 mmol/L (ref 20–29)
Calcium: 10 mg/dL (ref 8.7–10.2)
Chloride: 100 mmol/L (ref 96–106)
Creatinine, Ser: 0.71 mg/dL (ref 0.57–1.00)
GFR calc Af Amer: 110 mL/min/{1.73_m2} (ref >59)
GFR calc non Af Amer: 96 mL/min/{1.73_m2} (ref >59)
Globulin, Total: 2.9 g/dL (ref 1.5–4.5)
Glucose: 84 mg/dL (ref 65–99)
Potassium: 4.4 mmol/L (ref 3.5–5.2)
Sodium: 137 mmol/L (ref 134–144)
Total Protein: 7.6 g/dL (ref 6.0–8.5)

## 2020-04-17 LAB — VITAMIN D 25 HYDROXY (VIT D DEFICIENCY, FRACTURES): Vit D, 25-Hydroxy: 42.5 ng/mL (ref 30.0–100.0)

## 2020-04-17 LAB — HEMOGLOBIN A1C
Est. average glucose Bld gHb Est-mCnc: 114 mg/dL
Hgb A1c MFr Bld: 5.6 % (ref 4.8–5.6)

## 2020-04-20 ENCOUNTER — Encounter (INDEPENDENT_AMBULATORY_CARE_PROVIDER_SITE_OTHER): Payer: Self-pay

## 2020-04-21 ENCOUNTER — Telehealth: Payer: Medicare Other

## 2020-04-21 ENCOUNTER — Ambulatory Visit: Payer: Self-pay

## 2020-04-21 ENCOUNTER — Other Ambulatory Visit: Payer: Self-pay

## 2020-04-21 DIAGNOSIS — F3342 Major depressive disorder, recurrent, in full remission: Secondary | ICD-10-CM

## 2020-04-21 DIAGNOSIS — G35 Multiple sclerosis: Secondary | ICD-10-CM

## 2020-04-21 DIAGNOSIS — E6609 Other obesity due to excess calories: Secondary | ICD-10-CM

## 2020-04-21 DIAGNOSIS — E559 Vitamin D deficiency, unspecified: Secondary | ICD-10-CM

## 2020-04-21 DIAGNOSIS — R7303 Prediabetes: Secondary | ICD-10-CM

## 2020-04-22 ENCOUNTER — Ambulatory Visit: Payer: Self-pay

## 2020-04-25 NOTE — Chronic Care Management (AMB) (Signed)
Chronic Care Management   Follow Up Note   04/21/2020 Name: Grace [name] MRN: [id number] DOB: [birth year]  Referred by: [name], MD Reason for referral : Chronic Care Management (RN CM FU Call)   Grace [name] is a 59 y.o. year old female who is a primary care patient of [name], MD. The CCM team was consulted for assistance with chronic disease management and care coordination needs.    Review of patient status, including review of consultants reports, relevant laboratory and other test results, and collaboration with appropriate care team members and the patient's provider was performed as part of comprehensive patient evaluation and provision of chronic care management services.    SDOH (Social Determinants of Health) assessments performed: Yes See Care Plan activities for detailed interventions related to [place])   Completed outbound CCM RN CM follow up call with patient for a care plan update.     Outpatient Encounter Medications as of 04/21/2020  Medication Sig  . Ascorbic Acid (VITAMIN C) 1000 MG tablet Take 1,000 mg by mouth daily.  [name] Kitchen aspirin EC 81 MG tablet Take 81 mg by mouth daily.  . baclofen (LIORESAL) 10 MG tablet Take 2 tablets (20 mg total) by mouth 3 (three) times daily.  . Cholecalciferol (VITAMIN D) 50 MCG (2000 UT) CAPS Take 2,000 Units by mouth daily.   [name] Kitchen CINNAMON PO Take 1,200 mg by mouth daily.   . diphenhydrAMINE (BENADRYL) 25 MG tablet Take 25 mg by mouth 3 (three) times daily. Takes with Hydrocodone  . doxepin (SINEQUAN) 10 MG capsule TAKE 1 TO 2 CAPSULES AT BEDTIME  . ELDERBERRY PO Take by mouth daily.  . ergocalciferol (VITAMIN D2) 1.25 MG (50000 UT) capsule 1 po q wed and 1 po q sun  . etodolac (LODINE) 500 MG tablet Take 500 mg by mouth. 1-2 times a day  . Evening Primrose Oil 1000 MG CAPS 1300mg  daily  . famotidine (PEPCID) 20 MG tablet Take 1 tablet (20 mg total) by mouth 2 (two) times daily. (Patient taking differently:  Take 20 mg by mouth daily. )  . Flaxseed, Linseed, (FLAX SEED OIL) 1000 MG CAPS Take 1,200 mg by mouth daily.   [name] Kitchen gabapentin (NEURONTIN) 300 MG capsule Take 1 capsule (300 mg total) by mouth 3 (three) times daily.  . Grape Seed 50 MG CAPS Take 2 capsules by mouth daily.   [name] Kitchen HYDROcodone-acetaminophen (NORCO/VICODIN) 5-325 MG tablet Take 1 tablet by mouth 3 (three) times daily as needed for moderate pain.  [name] Kitchen ipratropium (ATROVENT) 0.06 % nasal spray Place 2 sprays into both nostrils 2 (two) times daily.  [name] Kitchen L-Lysine 1000 MG TABS Take by mouth. As needed  . Lactobacillus (PROBIOTIC ACIDOPHILUS PO) Take by mouth daily. Digestive Advantage  . levocetirizine (XYZAL) 5 MG tablet TAKE 1 TABLET BY MOUTH EVERY DAY IN THE EVENING  . loratadine-pseudoephedrine (QC LORATADINE-D) 10-240 MG 24 hr tablet Take 1 tablet by mouth daily.  [name] Kitchen LORazepam (ATIVAN) 1 MG tablet Take 1 tablet (1 mg total) by mouth at bedtime.  . Lutein-Bilberry (BILBERRY PLUS LUTEIN PO) Take by mouth daily.  . Melatonin 10 MG TABS Take 1 tablet by mouth at bedtime.   . metFORMIN (GLUCOPHAGE) 500 MG tablet 1 tab daily with lunch  . [place] 500 MG CAPS Take 2 capsules by mouth daily.  [name] Kitchen moxifloxacin (VIGAMOX) 0.5 % ophthalmic solution Place 1 drop into the right eye 4 times daily.  . Multiple Vitamins-Minerals (HAIR/SKIN/NAILS/BIOTIN) TABS Take by mouth daily.   [name] Kitchen  nystatin-triamcinolone ointment (MYCOLOG) Apply 1 application topically 2 (two) times daily.  . prednisoLONE acetate (PRED FORTE) 1 % ophthalmic suspension Place 1 drop into the right eye 4 times daily.  . rizatriptan (MAXALT-MLT) 10 MG disintegrating tablet Take 1 tablet (10 mg total) by mouth as needed for migraine. May repeat in 2 hours if needed.  Max of 2 tablets in 24 hours, and 10 tablets per month  . temazepam (RESTORIL) 15 MG capsule Take 1 capsule (15 mg total) by mouth at bedtime as needed for sleep.  [name] Kitchen tiZANidine (ZANAFLEX) 2 MG tablet Take up to 4 pills a day  .  vitamin E 1000 UNIT capsule Take 1,000 Units by mouth daily.   [name] Kitchen zolpidem (AMBIEN) 10 MG tablet Take 10 mg by mouth at bedtime as needed. For sleep   No facility-administered encounter medications on file as of 04/21/2020.     Objective:  Lab Results  Component Value Date   HGBA1C 5.6 04/16/2020   HGBA1C 5.9 (H) 11/22/2019   Lab Results  Component Value Date   LDLCALC 71 11/22/2019   CREATININE 0.71 04/16/2020   BP Readings from Last 3 Encounters:  04/16/20 132/84  04/15/20 (!) 92/50  04/02/20 108/69    Goals Addressed     Patient Care Plan: Multiple Sclerosis (Adult)    Problem Identified: Mobility and Independence     Long-Range Goal: Mobility and Independence Optimized   Start Date: 04/21/2020  Expected End Date: 07/20/2020  This Visit's Progress: On track  Priority: High  Note:   Current Barriers:   Ineffective Self Health Maintenance  Currently UNABLE TO independently self manage needs related to chronic health conditions.   Knowledge Deficits related to short term plan for care coordination needs and long term plans for chronic disease management needs Nurse Case Manager Clinical Goal(s):   Over the next 90 days, patient will work with care management team to address care coordination and chronic disease management needs related to Disease Management  Educational Needs  Care Coordination  Medication Management and Education  Psychosocial Support   Interventions:   Periodically review ability to perform activities of daily living and required amount of assistance needed for safety, optimal independence and self-care.  Identify barriers to participation in therapy or exercise such as pain with activity, anticipated or imagined pain, transportation, depression or fear.   Work with patient and family to develop self-management plan to remove barriers.  Over the next 90 days, patient will:  - set a daily activity goal - start slowly and increase the  amount of time every week - keep all scheduled MD f/u with Neurologist - report early signs or symptoms of exacerbation promptly to MD - avoid trigger known to trigger MS exacerbation   Follow Up Plan: Telephone follow up appointment with care management team member scheduled for:  06/26/20   Problem Identified: Symptom Relapse     Long-Range Goal: Symptom Relapse Prevented or Managed   Start Date: 04/21/2020  Expected End Date: 07/20/2020  This Visit's Progress: On track  Priority: High  Note:   .Current Barriers:   Ineffective Self Health Maintenance  Currently UNABLE TO independently self manage needs related to chronic health conditions.   Knowledge Deficits related to short term plan for care coordination needs and long term plans for chronic disease management needs Nurse Case Manager Clinical Goal(s):   Over the next 90 days, patient will work with care management team to address care coordination and chronic disease management needs related  to Disease Management  Educational Needs  Care Coordination  Medication Management and Education  Psychosocial Support   Interventions:   Monitor signs of relapse such as the development of a new symptom or worsening symptoms that last longer than 24 hours after a stable period of at least one month.   Assess and monitor for effective pain management   Instructed patient to call in refills when supply is getting low (5-7 days prior to running out) Patient Goals/Self Care Activities - call for prescription refill 3 days before needed - plan exercise or activity when pain is best controlled - prioritize tasks for the day  Follow Up Plan: Telephone follow up appointment with care management team member scheduled for: 06/26/20   Patient Care Plan: Obesity (Adult)    Problem Identified: Readiness for Weight Management   Priority: High    Long-Range Goal: Plan for Weight Management Developed   Start Date: 04/21/2020  Expected  End Date: 07/20/2020  This Visit's Progress: On track  Priority: High  Note:   Current Barriers:  [name] Kitchen Knowledge Deficits related to weight loss strategies as evidenced by weight gain reported/observed and/or altered eating habits . Chronic Disease Management support and education needs related to weight management in patient with HLD and Glaucoma, Vitamin d deficiency, sleep disturbance, Obesity, MS Nurse Case Manager Clinical Goal(s):  [name] Kitchen Over the next 90 days, patient will demonstrate improved health management independence as evidenced by adhering to provider prescribed dietary guidelines (specify), increasing daily water intake (patient does not have provider prescribed fluid restriction), and verbalizing understanding of specific eating plan Interventions:  . Evaluation of current treatment plan related to weight loss and patient's adherence to plan as established by provider . Provided verbal and/or written education to patient re: recommended life style changes: avoid fad diets, make small/incremental dietary and exercise changes, eat at the table and avoid eating in front of the TV, plan management of cravings, monitor snacking and cravings in food diary . Discussed plans with patient for ongoing care management follow up and provided patient with direct contact information for care management team . Reviewed scheduled/upcoming provider appointments including: next scheduled follow up with Nutritionist   Assess patient perspective on healthy weight, weight loss, motivation, and readiness for weight loss; review current dietary intake and exercise levels.   Review without judgment previous weight-loss attempts; acknowledge the challenge patient is facing.  Patient Goals/Self-Care Activities . Over the next 90 days, patient will:   - Patient will self administer medications as prescribed Patient will attend all scheduled provider appointments Patient will call pharmacy for medication  refills Patient will continue to perform ADL's independently Patient will continue to perform IADL's independently Patient will call provider office for new concerns or questions - set my weight loss goal - drink 6 to 8 glasses of water each day - fill half the plate with nonstarchy vegetables - keep a food diary  Follow Up Plan: Telephone follow up appointment with care management team member scheduled for: 06/26/20   Patient Care Plan: Glaucoma    Problem Identified: Glaucoma management   Priority: High    Long-Range Goal: Disease Progression Prevented or Minimized   Start Date: 04/21/2020  Expected End Date: 07/20/2020  This Visit's Progress: On track  Priority: High  Note:   Current Barriers:   Ineffective Self Health Maintenance  Currently UNABLE TO independently self manage needs related to chronic health conditions.   Knowledge Deficits related to short term plan for care coordination needs  and long term plans for chronic disease management needs Nurse Case Manager Clinical Goal(s):   Over the next 90 days, patient will work with care management team to address care coordination and chronic disease management needs related to Disease Management  Educational Needs  Care Coordination  Medication Management and Education   Interventions:   Determined patient is s/p eye laser surgery  Instructed patient to use her eye drops exactly as directed by Eye Specialist  Instructed patient to contact her Eye Specialist to report new or worsening symptoms, sudden change in vision and for questions or concerns that may arise  Instructed patient to keep all scheduled follow up appointments with her Eye Specialist Patient Goals/Self Care Activities:  Over the next 90 days, patient will:      -keep all scheduled appointments with Eye Specialist -follow eye care as directed by Eye Specialist -report new or worsening symptoms or sudden change in vision   Follow Up Plan:  Telephone follow up appointment with care management team member scheduled for: 06/26/20     Plan:   Telephone follow up appointment with care management team member scheduled for: 06/26/20  Barb Merino, RN, BSN, CCM Care Management Coordinator [facility]  Direct Phone: [phone]

## 2020-04-25 NOTE — Patient Instructions (Signed)
Visit Information  Goals Addressed      Other   .  Achieve a Healthy Weight-Obesity        Timeframe:  Long-Range Goal Priority:  High Start Date:  04/21/20                           Expected End Date:  07/20/20                     Follow Up Date 06/26/20    - drink 6 to 8 glasses of water each day - fill half the plate with nonstarchy vegetables - keep a food diary  - keep all appointments with Nutritionist as scheduled - follow diet plan per Nutritionist recommendations    Why is this important?    When you are ready to manage your weight, have a plan and have set a goal, it is time to take action.   Taking small steps to change how you eat and exercise is a good place to start.    Notes:     .  Keep Pain Under Control-Multiople Sclerosis        Timeframe:  Long-Range Goal Priority:  High Start Date:  04/21/20                           Expected End Date:  07/20/20                    Follow Up Date 06/26/20   - call for prescription refill 3 days before needed - plan exercise or activity when pain is best controlled - prioritize tasks for the day    Why is this important?    Day-to-day life can be hard when your loved one/you have pain.   Even a small change in emotion or a physical problem can make pain better or worse.   Coping with pain depends on how the mind and body reacts to pain.   Pain medicine is just one piece of the treatment puzzle.   There are many tools to help manage pain. A combination of them can be used to best meet your loved one's/your needs.     Notes:     [name] Kitchen  Maintain My Strength-Multiple Sclerosis        Timeframe:  Long-Range Goal Priority:  High Start Date: 04/21/20                            Expected End Date: 04/21/21                     Follow Up Date 06/26/20   - set a daily activity goal - start slowly and increase the amount of time every week  - keep all scheduled MD f/u with Neurologist - report early signs or symptoms of  exacerbation promptly to MD - avoid trigger known to trigger MS exacerbation    Why is this important?    Walking and easy exercises make your loved one/you stronger. They also increase energy.   Every Michio Thier bit helps, at any age.    Notes:     [name] Kitchen  Manage Glaucoma        Timeframe:  Long-Range Goal Priority:  High Start Date:  04/21/20  Expected End Date:  07/20/20       Follow up date:  06/26/20  Over the next 90 days, patient will:      -keep all scheduled appointments with Eye Specialist -follow eye care as directed by Eye Specialist -report new or worsening symptoms or sudden change in vision                 .  Set My Weight Loss Goal        Timeframe:  Long-Range Goal Priority:  High Start Date:  04/21/20                           Expected End Date:  07/20/20                     Follow Up Date 06/26/20   - set weight loss goal    Why is this important?   Losing only 5 to 15 percent of your weight makes a big difference in your health.    Notes:        The patient verbalized understanding of instructions, educational materials, and care plan provided today and declined offer to receive copy of patient instructions, educational materials, and care plan.   Telephone follow up appointment with care management team member scheduled for: 06/26/20  Lynne Logan, RN

## 2020-04-29 ENCOUNTER — Ambulatory Visit: Payer: Medicare Other | Admitting: Psychology

## 2020-04-30 ENCOUNTER — Encounter (INDEPENDENT_AMBULATORY_CARE_PROVIDER_SITE_OTHER): Payer: Self-pay | Admitting: Bariatrics

## 2020-04-30 ENCOUNTER — Other Ambulatory Visit (INDEPENDENT_AMBULATORY_CARE_PROVIDER_SITE_OTHER): Payer: Self-pay | Admitting: Family Medicine

## 2020-04-30 ENCOUNTER — Ambulatory Visit (INDEPENDENT_AMBULATORY_CARE_PROVIDER_SITE_OTHER): Payer: Medicare Other | Admitting: Bariatrics

## 2020-04-30 ENCOUNTER — Other Ambulatory Visit: Payer: Self-pay

## 2020-04-30 VITALS — BP 119/75 | HR 93 | Temp 97.8°F | Ht 67.0 in | Wt 208.0 lb

## 2020-04-30 DIAGNOSIS — Z9189 Other specified personal risk factors, not elsewhere classified: Secondary | ICD-10-CM | POA: Diagnosis not present

## 2020-04-30 DIAGNOSIS — R7303 Prediabetes: Secondary | ICD-10-CM

## 2020-04-30 DIAGNOSIS — E669 Obesity, unspecified: Secondary | ICD-10-CM

## 2020-04-30 DIAGNOSIS — E782 Mixed hyperlipidemia: Secondary | ICD-10-CM

## 2020-04-30 DIAGNOSIS — Z6832 Body mass index (BMI) 32.0-32.9, adult: Secondary | ICD-10-CM

## 2020-05-01 ENCOUNTER — Encounter (INDEPENDENT_AMBULATORY_CARE_PROVIDER_SITE_OTHER): Payer: Self-pay | Admitting: Bariatrics

## 2020-05-01 NOTE — Progress Notes (Signed)
Chief Complaint:   OBESITY Grace [name] is here to discuss her progress with her obesity treatment plan along with follow-up of her obesity related diagnoses. Grace [name] is on the Category 1 Plan and states she is following her eating plan approximately 90% of the time. Grace [name] states she is walking 10+ minutes 7 times per week.  Today's visit was #: 11 Starting weight: 221 lbs Starting date: 11/22/2019 Today's weight: 208 lbs Today's date: 04/30/2020 Total lbs lost to date: 13 lbs Total lbs lost since last in-office visit: 0 lbs  Interim History: Grace [name] is up 2 lbs from her last visit. Her body width is up per the bio-impedance.  Subjective:   1. Pre-diabetes Madelene is taking Metformin 500 mg.  Grace [name] has a diagnosis of prediabetes based on her elevated HgA1c and was informed this puts her at greater risk of developing diabetes. She continues to work on diet and exercise to decrease her risk of diabetes. She denies nausea or hypoglycemia.  Lab Results  Component Value Date   HGBA1C 5.6 04/16/2020   Lab Results  Component Value Date   INSULIN 29.8 (H) 10/10/2019    2. Hypercholesterolemia with hypertriglyceridemia Grace [name] is not taking medication. She is working with diet and exercise to control disease progression.  3. At risk for heart disease Grace [name] is at a higher than average risk for cardiovascular disease due to obesity, hyperlipidemia with elevated triglycerides. .   Assessment/Plan:   1. Pre-diabetes Continue current treatment plan. Grace [name] will continue to work on weight loss, exercise, and decreasing simple carbohydrates to help decrease the risk of diabetes.   2. Hypercholesterolemia with hypertriglyceridemia Grace [name] will continue to work on diet and exercise. She will decrease carbohydrates, decrease saturated fats, and no trans fats.  3. At risk for heart disease Grace [name] was given approximately 15 minutes of coronary artery disease prevention counseling today. She is 59 y.o.  female and has risk factors for heart disease including obesity. We discussed intensive lifestyle modifications today with an emphasis on specific weight loss instructions and strategies.   Repetitive spaced learning was employed today to elicit superior memory formation and behavioral change.  4. Class 1 obesity with serious comorbidity and body mass index (BMI) of 32.0 to 32.9 in adult, unspecified obesity type  Grace [name] is currently in the action stage of change. As such, her goal is to continue with weight loss efforts. She has agreed to the Category 1 Plan.   Discuss labs from 04/16/2020, CMP, Vit D, A1c, and magnesium. Meal plan and continue to drink adequate water.  Exercise goals: As is  Behavioral modification strategies: increasing lean protein intake, decreasing simple carbohydrates, increasing vegetables, increasing water intake, decreasing eating out, no skipping meals, meal planning and cooking strategies, keeping healthy foods in the home, dealing with family or coworker sabotage, travel eating strategies, holiday eating strategies , celebration eating strategies, avoiding temptations, planning for success and keeping a strict food journal.  Grace [name] has agreed to follow-up with our clinic in 2-3 weeks with Dr. Raliegh Scarlet. She was informed of the importance of frequent follow-up visits to maximize her success with intensive lifestyle modifications for her multiple health conditions. .  Objective:   Blood pressure 119/75, pulse 93, temperature 97.8 F (36.6 C), temperature source Oral, height 5\' 7"  (1.702 m), weight 208 lb (94.3 kg), last menstrual period 04/09/2013, SpO2 95 %. Body mass index is 32.58 kg/m.  General: Cooperative, alert, well developed, in no acute distress. HEENT: Conjunctivae and lids unremarkable. Cardiovascular:  Regular rhythm.  Lungs: Normal work of breathing. Neurologic: No focal deficits.   Lab Results  Component Value Date   CREATININE 0.71 04/16/2020    BUN 15 04/16/2020   NA 137 04/16/2020   K 4.4 04/16/2020   CL 100 04/16/2020   CO2 23 04/16/2020   Lab Results  Component Value Date   ALT 24 04/16/2020   AST 20 04/16/2020   ALKPHOS 111 04/16/2020   BILITOT 0.5 04/16/2020   Lab Results  Component Value Date   HGBA1C 5.6 04/16/2020   HGBA1C 5.9 (H) 11/22/2019   Lab Results  Component Value Date   INSULIN 29.8 (H) 10/10/2019   Lab Results  Component Value Date   TSH 1.500 11/22/2019   Lab Results  Component Value Date   CHOL 205 (H) 11/22/2019   HDL 108 11/22/2019   LDLCALC 71 11/22/2019   TRIG 161 (H) 11/22/2019   CHOLHDL 1.9 11/22/2019   Lab Results  Component Value Date   WBC 10.3 11/22/2019   HGB 13.7 11/22/2019   HCT 40.3 11/22/2019   MCV 93 11/22/2019   PLT 275 11/22/2019   No results found for: IRON, TIBC, FERRITIN  Attestation Statements:   Reviewed by clinician on day of visit: allergies, medications, problem list, medical history, surgical history, family history, social history, and previous encounter notes.  Edmund Hilda, am acting as Energy manager for [employer], DO.  I have reviewed the above documentation for accuracy and completeness, and I agree with the above. Corinna Capra, DO

## 2020-05-05 ENCOUNTER — Telehealth: Payer: Self-pay

## 2020-05-05 NOTE — Progress Notes (Signed)
Chronic Care Management Pharmacy Assistant   Name: Grace [name]  MRN: [id number] DOB: 1960/10/11  Reason for Encounter: Disease State  General Adherence Call.    PCP : [name], MD  Allergies:   Allergies  Allergen Reactions  . Lamictal [Lamotrigine] Shortness Of Breath, Itching and Swelling    Tongue swells  . Mobic [Meloxicam] Anaphylaxis  . Ultram [Tramadol Hcl] Anaphylaxis  . Codeine Itching  . Diclofenac Itching  . Latex     Latex band-aids cause redness and tears your skin  . Nitrofurantoin     Causing itching all over, tongue swelling, chest heaviness, swollen lips, cough, rapid HR, swollen eyelids, red eyes/gums, lips red/white per pt.  . Teriflunomide Diarrhea    Hair loss, joint pains, elevated liver enzymes abugio (joint pains and liver issues)  . Trazodone And Nefazodone     Mouth sores, tongue swelling  . Tape Rash    Medications: Outpatient Encounter Medications as of 05/05/2020  Medication Sig  . Ascorbic Acid (VITAMIN C) 1000 MG tablet Take 1,000 mg by mouth daily.  [name] Kitchen aspirin EC 81 MG tablet Take 81 mg by mouth daily.  . baclofen (LIORESAL) 10 MG tablet Take 2 tablets (20 mg total) by mouth 3 (three) times daily.  . Cholecalciferol (VITAMIN D) 50 MCG (2000 UT) CAPS Take 2,000 Units by mouth daily.   [name] Kitchen CINNAMON PO Take 1,200 mg by mouth daily.   . diphenhydrAMINE (BENADRYL) 25 MG tablet Take 25 mg by mouth 3 (three) times daily. Takes with Hydrocodone  . doxepin (SINEQUAN) 10 MG capsule TAKE 1 TO 2 CAPSULES AT BEDTIME  . ELDERBERRY PO Take by mouth daily.  . ergocalciferol (VITAMIN D2) 1.25 MG (50000 UT) capsule 1 po q wed and 1 po q sun  . etodolac (LODINE) 500 MG tablet Take 500 mg by mouth. 1-2 times a day  . Evening Primrose Oil 1000 MG CAPS 1300mg  daily  . famotidine (PEPCID) 20 MG tablet Take 1 tablet (20 mg total) by mouth 2 (two) times daily. (Patient taking differently: Take 20 mg by mouth daily.)  . Flaxseed, Linseed, (FLAX  SEED OIL) 1000 MG CAPS Take 1,200 mg by mouth daily.   gabapentin (NEURONTIN) 300 MG capsule Take 1 capsule (300 mg total) by mouth 3 (three) times daily.  . Grape Seed 50 MG CAPS Take 2 capsules by mouth daily.   [name] Kitchen HYDROcodone-acetaminophen (NORCO/VICODIN) 5-325 MG tablet Take 1 tablet by mouth 3 (three) times daily as needed for moderate pain.  [name] Kitchen ipratropium (ATROVENT) 0.06 % nasal spray Place 2 sprays into both nostrils 2 (two) times daily.  [name] Kitchen L-Lysine 1000 MG TABS Take by mouth. As needed  . Lactobacillus (PROBIOTIC ACIDOPHILUS PO) Take by mouth daily. Digestive Advantage  . levocetirizine (XYZAL) 5 MG tablet TAKE 1 TABLET BY MOUTH EVERY DAY IN THE EVENING  . loratadine-pseudoephedrine (QC LORATADINE-D) 10-240 MG 24 hr tablet Take 1 tablet by mouth daily.  [name] Kitchen LORazepam (ATIVAN) 1 MG tablet Take 1 tablet (1 mg total) by mouth at bedtime.  . Lutein-Bilberry (BILBERRY PLUS LUTEIN PO) Take by mouth daily.  . Melatonin 10 MG TABS Take 1 tablet by mouth at bedtime.   . metFORMIN (GLUCOPHAGE) 500 MG tablet 1 tab daily with lunch  . Moringa 500 MG CAPS Take 2 capsules by mouth daily.  [name] Kitchen moxifloxacin (VIGAMOX) 0.5 % ophthalmic solution Place into the right eye in the morning and at bedtime.  . Multiple Vitamins-Minerals (HAIR/SKIN/NAILS/BIOTIN) TABS Take by mouth  daily.   . nystatin-triamcinolone ointment (MYCOLOG) Apply 1 application topically 2 (two) times daily.  . prednisoLONE acetate (PRED FORTE) 1 % ophthalmic suspension Place 1 drop into the right eye 4 times daily.  . rizatriptan (MAXALT-MLT) 10 MG disintegrating tablet Take 1 tablet (10 mg total) by mouth as needed for migraine. May repeat in 2 hours if needed.  Max of 2 tablets in 24 hours, and 10 tablets per month  . temazepam (RESTORIL) 15 MG capsule Take 1 capsule (15 mg total) by mouth at bedtime as needed for sleep.  [name] Kitchen tiZANidine (ZANAFLEX) 2 MG tablet Take up to 4 pills a day  . vitamin E 1000 UNIT capsule Take 1,000 Units by mouth  daily.   [name] Kitchen zolpidem (AMBIEN) 10 MG tablet Take 10 mg by mouth at bedtime as needed. For sleep   No facility-administered encounter medications on file as of 05/05/2020.    Current Diagnosis: Patient Active Problem List   Diagnosis Date Noted  . At risk for heart disease 04/15/2020  . Other hyperlipidemia with hypertriglyceridemia 04/15/2020  . Prediabetes 01/01/2020  . Vitamin D deficiency 01/01/2020  . Vitamin B 12 deficiency 01/01/2020  . At risk for diabetes mellitus 01/01/2020  . Class 1 obesity with serious comorbidity and body mass index (BMI) of 32.0 to 32.9 in adult 01/01/2020  . 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 Disturbance of smell and taste 11/15/2019  . Depression 03/06/2019  . Diverticulosis 06/14/2018  . Hepatic steatosis 06/14/2018  . 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 Decreased pedal pulses 06/30/2016  . Pain in both lower extremities 06/30/2016  . Facial pain, atypical 06/08/2016  . Fibroids 02/12/2016  . Chest pain 07/07/2015  . Dizziness and giddiness 07/07/2015  . Leiomyoma of uterus 07/07/2015  . Lower abdominal pain 07/07/2015  . Hip pain 03/03/2015  . 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 Multiple sclerosis ([place]) 06/26/2014  . 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 Other fatigue 06/26/2014  . Dysesthesia 06/26/2014  . Depression with anxiety 06/26/2014  . 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 Insomnia 06/26/2014  . Low serum vitamin D 06/26/2014      Follow-Up:  Pharmacist Review - Called patient to do general adherence call. Unable to reach patient left messages for her to call me back.   [name], CPP. Notified  Judithann Sheen, [place] Pharmacist Assistant [phone]

## 2020-05-07 ENCOUNTER — Telehealth: Payer: Self-pay

## 2020-05-07 ENCOUNTER — Telehealth: Payer: Medicare Other

## 2020-05-07 NOTE — Telephone Encounter (Signed)
  Chronic Care Management   Outreach Note  05/07/2020 Name: Grace [name] MRN: [id number] DOB: [birth year]  Referred by: [name], MD Reason for referral : Care Coordination   Grace [name] is enrolled in a Managed Medicaid Health Plan: No  Third unsuccessful telephone outreach was attempted today. The patient was referred to the case management team for assistance with care management and care coordination. The patient's primary care provider has been notified of our unsuccessful attempts to make or maintain contact with the patient. The care management team is pleased to engage with this patient at any time in the future should he/she be interested in assistance from the care management team.   Follow Up Plan: No SW follow up planned at this time due to an inability to establish patient contact. The patient will remain active with RN Care Manager and embedded PharmD.  Bevelyn Ngo, BSW, CDP Social Worker, Certified Dementia Practitioner [facility] Care Management [phone]

## 2020-05-12 ENCOUNTER — Ambulatory Visit (INDEPENDENT_AMBULATORY_CARE_PROVIDER_SITE_OTHER): Payer: Medicare Other | Admitting: Family Medicine

## 2020-05-14 ENCOUNTER — Telehealth: Payer: Self-pay

## 2020-05-14 NOTE — Chronic Care Management (AMB) (Addendum)
Chronic Care Management Pharmacy Assistant   Name: Grace [name]  MRN: [id number] DOB: 1961-03-07  Reason for Encounter: Disease State - General Adherence Call.  Patient Questions:  1.  Have you seen any other providers since your last visit? No    2.  Any changes in your medicines or health? No   PCP : Grace Peng, MD  Allergies:   Allergies  Allergen Reactions   Lamictal [Lamotrigine] Shortness Of Breath, Itching and Swelling    Tongue swells   Mobic [Meloxicam] Anaphylaxis   Ultram [Tramadol Hcl] Anaphylaxis   Codeine Itching   Diclofenac Itching   Latex     Latex band-aids cause redness and tears your skin   Nitrofurantoin     Causing itching all over, tongue swelling, chest heaviness, swollen lips, cough, rapid HR, swollen eyelids, red eyes/gums, lips red/white per pt.   Teriflunomide Diarrhea    Hair loss, joint pains, elevated liver enzymes abugio (joint pains and liver issues)   Trazodone And Nefazodone     Mouth sores, tongue swelling   Tape Rash    Medications: Outpatient Encounter Medications as of 05/14/2020  Medication Sig   Ascorbic Acid (VITAMIN C) 1000 MG tablet Take 1,000 mg by mouth daily.   aspirin EC 81 MG tablet Take 81 mg by mouth daily.   baclofen (LIORESAL) 10 MG tablet Take 2 tablets (20 mg total) by mouth 3 (three) times daily.   Cholecalciferol (VITAMIN D) 50 MCG (2000 UT) CAPS Take 2,000 Units by mouth daily.    CINNAMON PO Take 1,200 mg by mouth daily.    diphenhydrAMINE (BENADRYL) 25 MG tablet Take 25 mg by mouth 3 (three) times daily. Takes with Hydrocodone   doxepin (SINEQUAN) 10 MG capsule TAKE 1 TO 2 CAPSULES AT BEDTIME   ELDERBERRY PO Take by mouth daily.   ergocalciferol (VITAMIN D2) 1.25 MG (50000 UT) capsule 1 po q wed and 1 po q sun   etodolac (LODINE) 500 MG tablet Take 500 mg by mouth. 1-2 times a day   Evening Primrose Oil 1000 MG CAPS 1300mg  daily   famotidine (PEPCID) 20 MG tablet Take 1 tablet (20 mg total) by  mouth 2 (two) times daily. (Patient taking differently: Take 20 mg by mouth daily.)   Flaxseed, Linseed, (FLAX SEED OIL) 1000 MG CAPS Take 1,200 mg by mouth daily.    gabapentin (NEURONTIN) 300 MG capsule Take 1 capsule (300 mg total) by mouth 3 (three) times daily.   Grape Seed 50 MG CAPS Take 2 capsules by mouth daily.    HYDROcodone-acetaminophen (NORCO/VICODIN) 5-325 MG tablet Take 1 tablet by mouth 3 (three) times daily as needed for moderate pain.   ipratropium (ATROVENT) 0.06 % nasal spray Place 2 sprays into both nostrils 2 (two) times daily.   L-Lysine 1000 MG TABS Take by mouth. As needed   Lactobacillus (PROBIOTIC ACIDOPHILUS PO) Take by mouth daily. Digestive Advantage   levocetirizine (XYZAL) 5 MG tablet TAKE 1 TABLET BY MOUTH EVERY DAY IN THE EVENING   loratadine-pseudoephedrine (QC LORATADINE-D) 10-240 MG 24 hr tablet Take 1 tablet by mouth daily.   LORazepam (ATIVAN) 1 MG tablet Take 1 tablet (1 mg total) by mouth at bedtime.   Lutein-Bilberry (BILBERRY PLUS LUTEIN PO) Take by mouth daily.   Melatonin 10 MG TABS Take 1 tablet by mouth at bedtime.    metFORMIN (GLUCOPHAGE) 500 MG tablet 1 tab daily with lunch   Moringa 500 MG CAPS Take 2 capsules by mouth  daily.   moxifloxacin (VIGAMOX) 0.5 % ophthalmic solution Place into the right eye in the morning and at bedtime.   Multiple Vitamins-Minerals (HAIR/SKIN/NAILS/BIOTIN) TABS Take by mouth daily.    nystatin-triamcinolone ointment (MYCOLOG) Apply 1 application topically 2 (two) times daily.   prednisoLONE acetate (PRED FORTE) 1 % ophthalmic suspension Place 1 drop into the right eye 4 times daily.   rizatriptan (MAXALT-MLT) 10 MG disintegrating tablet Take 1 tablet (10 mg total) by mouth as needed for migraine. May repeat in 2 hours if needed.  Max of 2 tablets in 24 hours, and 10 tablets per month   temazepam (RESTORIL) 15 MG capsule Take 1 capsule (15 mg total) by mouth at bedtime as needed for sleep.   tiZANidine (ZANAFLEX) 2 MG  tablet Take up to 4 pills a day   vitamin E 1000 UNIT capsule Take 1,000 Units by mouth daily.    zolpidem (AMBIEN) 10 MG tablet Take 10 mg by mouth at bedtime as needed. For sleep   No facility-administered encounter medications on file as of 05/14/2020.    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   Decreased pedal pulses 06/30/2016   Pain in both lower extremities 06/30/2016   Facial pain, atypical 06/08/2016   Fibroids 02/12/2016   Chest pain 07/07/2015   Dizziness and giddiness 07/07/2015   Leiomyoma of uterus 07/07/2015   Lower abdominal pain 07/07/2015   Hip pain 03/03/2015   Optic neuritis 06/27/2014   Multiple sclerosis ([place]) 06/26/2014   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/26/2014   Other fatigue 06/26/2014   Dysesthesia 06/26/2014   Depression with anxiety 06/26/2014   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 Patient states she has been taking her medications as directed by providers. Patient states that she does not need any refills at this time . Patient is aware that if she needs to reach out to [place] at any time  we will be there to help.   [name], CPP. Notified.  Judithann Sheen, [place] Pharmacist Assistant [phone]   I have reviewed the care management and care coordination activities outlined in this encounter and I am certifying that I agree with the content of this note. No further action required.  4 minutes spent in review, coordination, and documentation.  Mayford Knife, [facility] 05/29/20 9:01 AM

## 2020-05-27 ENCOUNTER — Ambulatory Visit: Payer: Medicare Other | Admitting: Psychology

## 2020-06-05 ENCOUNTER — Other Ambulatory Visit: Payer: Self-pay

## 2020-06-05 MED ORDER — HYDROCODONE-ACETAMINOPHEN 5-325 MG PO TABS: 1.0000 | ORAL_TABLET | Freq: Three times a day (TID) | ORAL | 0 refills | Status: DC | PRN

## 2020-06-20 ENCOUNTER — Telehealth: Payer: Self-pay

## 2020-06-20 NOTE — Chronic Care Management (AMB) (Signed)
Chronic Care Management Pharmacy Assistant   Name: Grace [name]  MRN: [id number] DOB: 12/12/1960  Reason for Encounter: General Adherence Call  Patient Questions:  1.  Have you seen any other providers since your last visit? Yes, 06/04/20-Bond, Tracie Harrier, MD ([facility]) Post-op Exam.    2.  Any changes in your medicines or health? No    PCP : [name], MD  Allergies:   Allergies  Allergen Reactions   Lamictal [Lamotrigine] Shortness Of Breath, Itching and Swelling    Tongue swells   Mobic [Meloxicam] Anaphylaxis   Ultram [Tramadol Hcl] Anaphylaxis   Codeine Itching   Diclofenac Itching   Latex     Latex band-aids cause redness and tears your skin   Nitrofurantoin     Causing itching all over, tongue swelling, chest heaviness, swollen lips, cough, rapid HR, swollen eyelids, red eyes/gums, lips red/white per pt.   Teriflunomide Diarrhea    Hair loss, joint pains, elevated liver enzymes abugio (joint pains and liver issues)   Trazodone And Nefazodone     Mouth sores, tongue swelling   Tape Rash    Medications: Outpatient Encounter Medications as of 06/20/2020  Medication Sig   Ascorbic Acid (VITAMIN C) 1000 MG tablet Take 1,000 mg by mouth daily.   aspirin EC 81 MG tablet Take 81 mg by mouth daily.   baclofen (LIORESAL) 10 MG tablet Take 2 tablets (20 mg total) by mouth 3 (three) times daily.   Cholecalciferol (VITAMIN D) 50 MCG (2000 UT) CAPS Take 2,000 Units by mouth daily.    CINNAMON PO Take 1,200 mg by mouth daily.    diphenhydrAMINE (BENADRYL) 25 MG tablet Take 25 mg by mouth 3 (three) times daily. Takes with Hydrocodone   doxepin (SINEQUAN) 10 MG capsule TAKE 1 TO 2 CAPSULES AT BEDTIME   ELDERBERRY PO Take by mouth daily.   ergocalciferol (VITAMIN D2) 1.25 MG (50000 UT) capsule 1 po q wed and 1 po q sun   etodolac (LODINE) 500 MG tablet Take 500 mg by mouth. 1-2 times a day   Evening Primrose Oil 1000 MG CAPS 1300mg  daily    famotidine (PEPCID) 20 MG tablet Take 1 tablet (20 mg total) by mouth 2 (two) times daily. (Patient taking differently: Take 20 mg by mouth daily.)   Flaxseed, Linseed, (FLAX SEED OIL) 1000 MG CAPS Take 1,200 mg by mouth daily.    gabapentin (NEURONTIN) 300 MG capsule Take 1 capsule (300 mg total) by mouth 3 (three) times daily.   Grape Seed 50 MG CAPS Take 2 capsules by mouth daily.    HYDROcodone-acetaminophen (NORCO/VICODIN) 5-325 MG tablet Take 1 tablet by mouth 3 (three) times daily as needed for moderate pain.   ipratropium (ATROVENT) 0.06 % nasal spray Place 2 sprays into both nostrils 2 (two) times daily.   L-Lysine 1000 MG TABS Take by mouth. As needed   Lactobacillus (PROBIOTIC ACIDOPHILUS PO) Take by mouth daily. Digestive Advantage   levocetirizine (XYZAL) 5 MG tablet TAKE 1 TABLET BY MOUTH EVERY DAY IN THE EVENING   loratadine-pseudoephedrine (QC LORATADINE-D) 10-240 MG 24 hr tablet Take 1 tablet by mouth daily.   LORazepam (ATIVAN) 1 MG tablet Take 1 tablet (1 mg total) by mouth at bedtime.   Lutein-Bilberry (BILBERRY PLUS LUTEIN PO) Take by mouth daily.   Melatonin 10 MG TABS Take 1 tablet by mouth at bedtime.    metFORMIN (GLUCOPHAGE) 500 MG tablet 1 tab daily with lunch   [place]a 500 MG CAPS  Take 2 capsules by mouth daily.   moxifloxacin (VIGAMOX) 0.5 % ophthalmic solution Place into the right eye in the morning and at bedtime.   Multiple Vitamins-Minerals (HAIR/SKIN/NAILS/BIOTIN) TABS Take by mouth daily.    nystatin-triamcinolone ointment (MYCOLOG) Apply 1 application topically 2 (two) times daily.   prednisoLONE acetate (PRED FORTE) 1 % ophthalmic suspension Place 1 drop into the right eye 4 times daily.   rizatriptan (MAXALT-MLT) 10 MG disintegrating tablet Take 1 tablet (10 mg total) by mouth as needed for migraine. May repeat in 2 hours if needed.  Max of 2 tablets in 24 hours, and 10 tablets per month   temazepam (RESTORIL) 15 MG capsule Take 1 capsule  (15 mg total) by mouth at bedtime as needed for sleep.   tiZANidine (ZANAFLEX) 2 MG tablet Take up to 4 pills a day   vitamin E 1000 UNIT capsule Take 1,000 Units by mouth daily.    zolpidem (AMBIEN) 10 MG tablet Take 10 mg by mouth at bedtime as needed. For sleep   No facility-administered encounter medications on file as of 06/20/2020.    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   Decreased pedal pulses 06/30/2016   Pain in both lower extremities 06/30/2016   Facial pain, atypical 06/08/2016   Fibroids 02/12/2016   Chest pain 07/07/2015   Dizziness and giddiness 07/07/2015   Leiomyoma of uterus 07/07/2015   Lower abdominal pain 07/07/2015   Hip pain 03/03/2015   Optic neuritis 06/27/2014   Multiple sclerosis ([place]) 06/26/2014   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/26/2014   Other fatigue 06/26/2014   Dysesthesia 06/26/2014   Depression with anxiety 06/26/2014   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 Patient states she has been experiencing symptoms of heaviness in her chest, loss of taste, dry cough, and slight shortness of breath. Patient stated this has been going on since the new year but has slowly gotten better. Patient states her neurologist placed her on  steroids (Prednisone) but unsure of dosage. Patient voiced she is currently sitting in the ER with husband due to him urinating blood and blood in the feces. Patient also stated her husband will be admitted in a skilled nursing facility hopefully by tomorrow. Patient verbalized she has not scheduled a follow up with Dr.Robyn Baird [facility] as of yet and has not been tested for Covid-19 but husband was tested with a negative result. Advised the patient to please seek emergency assistance or follow-up with PCP is symptoms worsen. Patient verbalized understanding.  The patient communicated she will like a prescription of Vitamin D 50 MCG (2000 UT) CAPS. Patient confirmed she is not needing any other refills at this time.   [name], CPP Notifed.  Raynelle Highland, [place] Pharmacist Assistant [phone]  CCM Total Time:28 minutes

## 2020-06-23 ENCOUNTER — Other Ambulatory Visit: Payer: Self-pay | Admitting: Neurology

## 2020-06-23 MED ORDER — METHYLPREDNISOLONE 4 MG PO TABS
ORAL_TABLET | ORAL | 0 refills | Status: DC
Start: 2020-06-23 — End: 2020-07-16

## 2020-06-25 ENCOUNTER — Ambulatory Visit: Payer: Medicare Other | Admitting: Family Medicine

## 2020-06-25 ENCOUNTER — Encounter: Payer: Self-pay | Admitting: Internal Medicine

## 2020-06-25 ENCOUNTER — Other Ambulatory Visit: Payer: Medicare Other

## 2020-06-25 DIAGNOSIS — Z20822 Contact with and (suspected) exposure to covid-19: Secondary | ICD-10-CM

## 2020-06-25 NOTE — Patient Instructions (Incomplete)
Below is our plan:  We will   Please make sure you are staying well hydrated. I recommend 50-60 ounces daily. Well balanced diet and regular exercise encouraged. Consistent sleep schedule with 6-8 hours recommended.   Please continue follow up with care team as directed.   Follow up with Dr Felecia Shelling in 4-6 months   You may receive a survey regarding today's visit. I encourage you to leave honest feed back as I do use this information to improve patient care. Thank you for seeing me today!      Multiple Sclerosis Multiple sclerosis (MS) is a disease of the brain, spinal cord, and optic nerves (central nervous system). It causes the body's disease-fighting (immune) system to destroy the protective covering (myelin sheath) around nerves in the brain. When this happens, signals (nerve impulses) going to and from the brain and spinal cord do not get sent properly or may not get sent at all. There are several types of MS:  Relapsing-remitting MS. This is the most common type. This causes sudden attacks of symptoms. After an attack, you may recover completely until the next attack, or some symptoms may remain permanently.  Secondary progressive MS. This usually develops after the onset of relapsing-remitting MS. Similar to relapsing-remitting MS, this type also causes sudden attacks of symptoms. Attacks may be less frequent, but symptoms slowly get worse (progress) over time.  Primary progressive MS. This causes symptoms that steadily progress over time. This type of MS does not cause sudden attacks of symptoms. The age of onset of MS varies, but it often develops between 87-66 years of age. MS is a lifelong (chronic) condition. There is no cure, but treatment can help slow down the progression of the disease. What are the causes? The cause of this condition is not known. What increases the risk? You are more likely to develop this condition if:  You are a woman.  You have a relative with MS.  However, the condition is not passed from parent to child (inherited).  You have a lack (deficiency) of vitamin D.  You smoke. MS is more common in the [place] than in the [place]. What are the signs or symptoms? Relapsing-remitting and secondary progressive MS cause symptoms to occur in episodes or attacks that may last weeks to months. There may be long periods between attacks in which there are almost no symptoms. Primary progressive MS causes symptoms to steadily progress after they develop. Symptoms of MS vary because of the many different ways it affects the central nervous system. The main symptoms include:  Vision problems and eye pain.  Numbness and weakness.  Inability to move your arms, hands, feet, or legs (paralysis).  Balance problems.  Shaking that you cannot control (tremors).  Muscle spasms.  Problems with thinking (cognitive changes). MS can also cause symptoms that are associated with the disease, but are not always the direct result of an MS attack. They may include:  Inability to control urination or bowel movements (incontinence).  Headaches.  Fatigue.  Inability to tolerate heat.  Emotional changes.  Depression.  Pain. How is this diagnosed? This condition is diagnosed based on:  Your symptoms.  A neurological exam. This involves checking central nervous system function, such as nerve function, reflexes, and coordination.  MRIs of the brain and spinal cord.  Lab tests, including a lumbar puncture that tests the fluid that surrounds the brain and spinal cord (cerebrospinal fluid).  Tests to measure the electrical activity of  the brain in response to stimulation (evoked potentials). How is this treated? There is no cure for MS, but medicines can help decrease the number and frequency of attacks and help relieve nuisance symptoms. Treatment options may include:  Medicines that reduce the frequency of attacks. These  medicines may be given by injection, by mouth (orally), or through an IV.  Medicines that reduce inflammation (steroids). These may provide short-term relief of symptoms.  Medicines to help control pain, depression, fatigue, or incontinence.  Nutritional counseling. Vitamin D supplements, if you have a deficiency.  Using devices to help you move around (assistive devices), such as braces, a cane, or a walker.  Physical therapy to strengthen and stretch your muscles.  Occupational therapy to help you with everyday tasks.  Alternative or complementary treatments such as exercise, massage, or acupuncture.   Follow these instructions at home:  Take over-the-counter and prescription medicines only as told by your health care provider.  Do not drive or use heavy machinery while taking prescription pain medicine.  Use assistive devices as recommended by your physical therapist or your health care provider.  Exercise as directed by your health care provider.  Eating healthy can help manage MS symptoms.  Return to your normal activities as told by your health care provider. Ask your health care provider what activities are safe for you.  Reach out for support. Share your feelings with friends, family, or a support group.  Keep all follow-up visits as told by your health care provider and therapists. This is important. Where to find more information  National Multiple Sclerosis Society: https://www.nationalmssociety.[place] [employer] Neurological [employer] and Stroke: http://hendricks-barton.net/  [employer]: [employer].dk Contact a health care provider if:  You feel depressed.  You develop new pain or numbness.  You have tremors.  You have problems with sexual function. Get help right away if:  You develop paralysis.  You develop numbness.  You have problems with your bladder or bowel function.  You  develop double vision.  You lose vision in one or both eyes.  You develop suicidal thoughts.  You develop severe confusion. If you ever feel like you may hurt yourself or others, or have thoughts about taking your own life, get help right away. You can go to your nearest emergency department or call:  Your local emergency services (911 in the [place]  A suicide crisis helpline, such as the [place] at 816 140 0529. This is open 24 hours a day. Summary  Multiple sclerosis (MS) is a disease of the central nervous system that causes the body's immune system to destroy the protective covering (myelin sheath) around nerves in the brain.  There are 3 types of MS: relapsing-remitting, secondary progressive, and primary progressive. Relapsing-remitting and secondary progressive MS cause symptoms to occur in episodes or attacks that may last weeks to months. Primary progressive MS causes symptoms to steadily progress after they develop.  There is no cure for MS, but medicines can help decrease the number and frequency of attacks and help relieve nuisance symptoms. Treatment may also include physical or occupational therapy.  If you develop numbness, paralysis, vision problems, or other neurological symptoms, get help right away. This information is not intended to replace advice given to you by your health care provider. Make sure you discuss any questions you have with your health care provider. Document Revised: 02/05/2020 Document Reviewed: 02/05/2020 Elsevier Patient Education  2021 [employer].

## 2020-06-25 NOTE — Progress Notes (Deleted)
PATIENT: Grace [name] DOB: 12-07-1960  REASON FOR VISIT: follow up HISTORY FROM: patient  No chief complaint on file.    HISTORY OF PRESENT ILLNESS:  06/25/20 ALL:  She returns for follow up on RRMS. She is not current on DMT. LAst MRI 01/2018 stable.   She reported sudden onset of loss of smell and altered taste in 11/2019 with mild sinusitis/allergy symptoms. Sense of smell returned with medrol taper. She called to report she "lost taste" again on 06/23/2020.   She continues gabapentin 300mg  TID, hydrocodone 5-325mg  TID, baclofen 20mg  TID and tizanidine 2mg  QID for pain management. Back pain and dysesthesias are stable. Temazepam 15mg  added for insomnia at last visit. She also continues doxepin 10-20mg , benadryl and melatonin.  She is not taking ativan. Ambien 10mg    02/20/2020 RS: She is off any DMT for MS and has felt neurologically stable.   She has had more issues with vision lately.  She had inflammation and elevated IOP after cataract surgery and needed to have a steroid pellet injected.   She is sleeping poorly despite hydrocodone, baclofen, doxepin, melatonin, benadryl at night.  She has more trouble staying asleep than falling asleep.   She had been on lorazepam in the past.   Ambien had helped her some in the past but she is concerned about side effects (one of her friends had sleep-driving and wrecked).    She feels her MS is fairly stable.  Specifically, gait is mildly off balance but stable.  She has no recent falls.  She will sometimes bump into things.  She has some pins-and-needles dysesthesias in the hands..   She continues to experience pain in the neck and back.  The back pain and dysesthesias are helped by gabapentin and hydrocodone.  She has not done the Covid vaccination as she has had reactions to the flu vaccine.   11/15/19 RS: She lost her taste and had a mild sinusitis at the time.   She saw ENT and had a scope study.   She was told she might be having  some allergies (there was some discharge).   She also was told thid cold be a neurologic issue.   With a prednisone pack taste came back a little bit.    Otherwise, she feels she is fairly stable.  Specifically, gait is mildly off balance but stable.  She has no recent falls.  She will sometimes bump into things.  She has some pins-and-needles dysesthesias in the hands.  She continues to experience pain in the neck and back.  This is helped by gabapentin and hydrocodone.  She has insomnia that is helped by her medications but she still has a lot of sleep maintenance issues some nights.  09/05/2019 RS:  She has MS.   She is not on any DMT as her course has been mild.  She did have a possible small exacerbation in 2019 and DMTs have been discussed.    Gait is doing the same.   She feels balance is mildly off and had one fall.  She sometimes bumps into things but notes eye issues.    She has some pins/needleds tingling and numbness, worse with heat.   Strength is about the same --- mild reduced strength is noted in hands at times.  She has some urinary urgency and Kegel exercises were recommended to her by gynecology.   She is sleeping poorly, both sleep onset and maintenance.    She takes melatonin, doxepin, baclofen  and tizandine, gabapentin and hydrocodone at night.  If her mind is racing, she will take lorazepam.   She has recent cataract surgery and had some complications.   She also has a h/o optic neuritis.   03/06/19 RS: She has MS and is not on a DMT.   She did have an exacerbation in 2019 but chose not to start a DMT.    She had optic neuritis near the onset. She is needing to have cataract surgery and we discussed no contraindications from MS viewpoint.   SHe is walking about the same with mild reduced balance.  Leg strength is good.  She has numbness in her fingers and toes.    She rarely has a whole body jerk but less than last year.   She has Lhermitte sign.     She has had  increased BP off/on.    Weight is stable.     She has trouble with insomnia, both sleep onset and maintenance.    She takes doxepin.   She takes zolpidem some nights with benefit.    She feels her depression is worse.  She notes a lot of stress with her husband's illness.  We discussed referral to behavioral health.   11/30/2018 ALL:  [name] is a 60 y.o. female here today for follow up for MS. She is not currently on DMT. She continues gabapentin 300mg  daily as needed, Vicodin 5-325 TID PRN, etodolac 500mg  1-2 times daily and tizanidine 2mg  QID as needed for pain. Klonopin 1mg  at bedtime helps with sleep. She is also taking doxepin. She does not feel that she is sleeping well. She reports that over the past month she has had a really hard time. Over the past couple of days she has not been as alert as usual. She feels tired. She has also noted numbness of bilateral pinky fingers just on the medial edge from the middle of the finger down. No other numbness. She reports continued generalized weakness. She has had some blurry vision when she is tired. She denies changes in bowel or bladder habits.    HISTORY: (copied from Dr Garth Bigness note on 06/01/2018)  Grace [name] is a 60 y.o. woman with multiple sclerosis.     Update 06/01/2018: She feels her MS is stable.   She has not had any new symptoms since the last visit but had an exacerbation May 2019 (spinal).   She feels she has recovered.   She went on Aubagio but had LFT elevation and stopped.    Many years ago she was on Betaseron and Copaxone.    She was having propriospinal myoclonus and a Lhermitte sign at the onset of her exacerbation last year.    The myoclonus is better but the Lhermitte is the same.     She has right eye blurriness and was found to have an epiretinal membrane OU.    She also has had epidural steroid injections x 2 at [facility].  She recently got braces and notes more snoring.   She has had PSG in  the past.  One had mild OSA and another was normal.     Update 09/28/2017: At the last visit, after going many years without an exacerbation and being off of disease modifying therapies for long time, she had the onset of right-sided numbness and clumsiness and was found on MRI to have an acute lesion at C6-C7 (and 2 older lesions at C5 and T2) Aubagio was prescribed but she  has not yet started due to insurance and co-pay issues.    She still has an intermittent Lhermitte sign and has some jerks in her limbs when drowsy.   The alprazolam did not help the jerks.      She is still noting a hot sensation all over with sweating and associated with elevated BP lasting 10-90 minutes.    These occur more at night.     She first noted this in February and when she saw Ortho, she was prescribed a steroid pack.    A few days after the steroid, symptoms recurred.    She was having more blurry vision and saw Dr. Hassell Done at [facility].   A macular buckle was seen and she is being referred to a retina specialist.    Update 09/15/2017: In the past, she was on Betaseron and Copaxone.   She felt crazy on Copaxone and had trouble proessing.  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   She was diagnosed after ON in 1997 and had her big exacerbation in 2002 (C5).  She has had no DMT  Currently, she has numbness and clumsiness on the right.  The numbness has become very painful recently.  She feels it in both hands and feet but it is worse on the right.  When she is drowsy she gets jerks in bed.     She also notes a Lhermitte sign.   She feels her sleep is worse in general with insomnia.  I personally reviewed the MRI of the cervical spine from 06/24/2017.  It shows 2 old plaques, 1 posteriorly adjacent to C5 and another adjacent to T2.  Additionally there is a plaque adjacent to C6-C7 in the right posterior spinal cord.  There is subtle enhancement  consistent with a more acute lesion.  This focus was not present on the previous MRI from 2016.   Update 04/28/2017:   She remains on a disease modifying therapy for her MS. She has not noted any definite exacerbations recently her fatigue is worse. MRIs have shown a few predominantly nonspecific white matter foci and there has been no change from 2013-2018. Although there are only a couple nonspecific foci of the brain, she does have a couple foci within the spinal cord prompting her diagnosis.]  Her gait is stable. She stumbles some but has no falls. Strength is fine. She often notes spasticity in the legs. Baclofen and tizanidine has helped some. She also has some burning dysesthetic pain in feet, and to a lesser extent in the hands. Unfortunately, she did not get much benefit from multiple medications including nortriptyline, gabapentin, Trileptal, Lamictal, Lyrica and tramadol.    She notes blurry vision has worsened, right worse than left.  Colors are desaturated, more on the right.   Vision seems better in cold temperatures.  . Bladder function is fine.  Reports a lot of fatigue. She was on phentermine but had mild side effects and stopped.   She thinks it may have helped slightly.    Depression has done better on Lexapro. Depression worsened when her husband had a stroke in 2015.   From 11/08/2016: MS:   She denies recent exacerbation But notes more left facial pain and some vision blurring at times..    She was on Betaseron and Copaxone in the past.   She has not wanted to try any of the oral agents or IV drugs.  Her last MRI 2/18 and was unchanged from the previous 2 MRIs (2016 2013) and just shows a few fairly nonspecific foci.  She is noting more left facial pain, right facial pain near the ear and lower back.    MRI of the brain 06/2016 showed a few stable T2 spots which is fairly non-specific and unchanged compared to her previous MRI form 2016.     Facial Pain:   She was  experiencing facial pain in the left cheek that fluctuated.  We started Keppra and it helped some.  \ Lower back pain:   She sees Dr. Lynann Bologna for her lower back pain and has had a bilateral L5S1 facet block.  She felt pain was better for a couple days but then pain increased when she caught her husband's fall a day later.  Pain has continued.  Gait/strength/sensation:  Gait is stable with some stumbles but no recent falls.   She notes a mild limp (but right hip has been hurting more).    Strength is fine.  She has mild spasticity, helped by baclofen and tizanidine (she alternates).     She has burning dysesthetic pain in the front of the legs and feet and in the the hands (lLeft = Right).   Nortriptyline, gabapentin and trileptal were not effective and Lamictal caused a rash.   Lyrica was not tolerated .   Tramadol was not well tolerated.    She reports mild left leg weakness (old) but notes her gait is slightly clumsy.    Spasticity is helped with soma, tizanidine and baclofen.   Vision:  She also is noting left eye pain and blurry vision but her eye exam was normal by ophthalmology.        Bladder: She notes urinary urgency and frequency.  Fatigue/sleep:   She reports a lot of physical and cognitive fatigue. Ritalin and Provigil have not helped her any. She also has sleep onset and sleep maintenance insomnia.   She takes muscle relaxants and doxepin at bedtime. She was on CPAP years ago but later PSG did not show OSA . Her periodic limb movements of sleep were improved with gabapentin.       Mood/cognition:  She feels her to depression has been worse. Her husband is disabled and she feels mood has been worse since his stroke. She has mild anxiety.   She was once on SSRI it is not sure if it helped her much.   She notes mild cognitive dysfunction.   She reports reduced short term memory and verbal fluency.   Ritalin did not help cognition much .     MS History:   She had transverse myelitis in 1998  and was found to have a cervical cord plaque . She had optic neuritis in 1999. In 2002, she had right-sided numbness. MRIs of the brain show just a couple of nonspecific foci. A cervical spine MRI showed a focus consistent with demyelination at C5. Additionally, there are 2 smaller foci at T2 and T3-T4.  CSF was also abnormal c/w MS.     She was initially placed on Betaseron and then on Copaxone. She was unable to tolerate either of these and has not been on any DMT medication since 2003.   She retried Betaseron but stopped after diagnosis of avascular necrosis requiring hip replacement.    Of note, she had had only one course of IV steroids prior to that and has had some afterwards.   She had not wanted  to go on Tysabri or Gilenya and opted to go on no disease modifying therapy since 2012. At her last visit on 01/17/2014, she decided to go on Aubagio but never started.     Most recent MRIs are from earlier this year and showed no changes.   REVIEW OF SYSTEMS: Out of a complete 14 system review of symptoms, the patient complains only of the following symptoms, weakness, numbness, headaches, and all other reviewed systems are negative.  ALLERGIES: Allergies  Allergen Reactions  . Lamictal [Lamotrigine] Shortness Of Breath, Itching and Swelling    Tongue swells  . Mobic [Meloxicam] Anaphylaxis  . Ultram [Tramadol Hcl] Anaphylaxis  . Codeine Itching  . Diclofenac Itching  . Latex     Latex band-aids cause redness and tears your skin  . Nitrofurantoin     Causing itching all over, tongue swelling, chest heaviness, swollen lips, cough, rapid HR, swollen eyelids, red eyes/gums, lips red/white per pt.  . Teriflunomide Diarrhea    Hair loss, joint pains, elevated liver enzymes abugio (joint pains and liver issues)  . Trazodone And Nefazodone     Mouth sores, tongue swelling  . Tape Rash    HOME MEDICATIONS: Outpatient Medications Prior to Visit  Medication Sig Dispense Refill  . Ascorbic Acid  (VITAMIN C) 1000 MG tablet Take 1,000 mg by mouth daily.    [name] Kitchen aspirin EC 81 MG tablet Take 81 mg by mouth daily.    . baclofen (LIORESAL) 10 MG tablet Take 2 tablets (20 mg total) by mouth 3 (three) times daily. 180 tablet 11  . Cholecalciferol (VITAMIN D) 50 MCG (2000 UT) CAPS Take 2,000 Units by mouth daily.     [name] Kitchen CINNAMON PO Take 1,200 mg by mouth daily.     . diphenhydrAMINE (BENADRYL) 25 MG tablet Take 25 mg by mouth 3 (three) times daily. Takes with Hydrocodone    . doxepin (SINEQUAN) 10 MG capsule TAKE 1 TO 2 CAPSULES AT BEDTIME 180 capsule 3  . ELDERBERRY PO Take by mouth daily.    . ergocalciferol (VITAMIN D2) 1.25 MG (50000 UT) capsule 1 po q wed and 1 po q sun 8 capsule 0  . etodolac (LODINE) 500 MG tablet Take 500 mg by mouth. 1-2 times a day    . Evening Primrose Oil 1000 MG CAPS 1300mg  daily    . famotidine (PEPCID) 20 MG tablet Take 1 tablet (20 mg total) by mouth 2 (two) times daily. (Patient taking differently: Take 20 mg by mouth daily.) 180 tablet 1  . Flaxseed, Linseed, (FLAX SEED OIL) 1000 MG CAPS Take 1,200 mg by mouth daily.     [name] Kitchen gabapentin (NEURONTIN) 300 MG capsule Take 1 capsule (300 mg total) by mouth 3 (three) times daily. 90 capsule 11  . Grape Seed 50 MG CAPS Take 2 capsules by mouth daily.     [name] Kitchen HYDROcodone-acetaminophen (NORCO/VICODIN) 5-325 MG tablet Take 1 tablet by mouth 3 (three) times daily as needed for moderate pain. 90 tablet 0  . ipratropium (ATROVENT) 0.06 % nasal spray Place 2 sprays into both nostrils 2 (two) times daily.    [name] Kitchen L-Lysine 1000 MG TABS Take by mouth. As needed    . Lactobacillus (PROBIOTIC ACIDOPHILUS PO) Take by mouth daily. Digestive Advantage    . levocetirizine (XYZAL) 5 MG tablet TAKE 1 TABLET BY MOUTH EVERY DAY IN THE EVENING 90 tablet 2  . loratadine-pseudoephedrine (QC LORATADINE-D) 10-240 MG 24 hr tablet Take 1 tablet by mouth daily. 90 tablet 2  .  LORazepam (ATIVAN) 1 MG tablet Take 1 tablet (1 mg total) by mouth at bedtime. 30  tablet 5  . Lutein-Bilberry (BILBERRY PLUS LUTEIN PO) Take by mouth daily.    . Melatonin 10 MG TABS Take 1 tablet by mouth at bedtime.     . metFORMIN (GLUCOPHAGE) 500 MG tablet 1 tab daily with lunch 30 tablet 0  . methylPREDNISolone (MEDROL) 4 MG tablet Take 6 tablets on day 1 and decrease by 1 tablet daily until gone. 21 tablet 0  . Moringa 500 MG CAPS Take 2 capsules by mouth daily.    [name] Kitchen moxifloxacin (VIGAMOX) 0.5 % ophthalmic solution Place into the right eye in the morning and at bedtime.    . Multiple Vitamins-Minerals (HAIR/SKIN/NAILS/BIOTIN) TABS Take by mouth daily.     [name] Kitchen nystatin-triamcinolone ointment (MYCOLOG) Apply 1 application topically 2 (two) times daily. 30 g 2  . prednisoLONE acetate (PRED FORTE) 1 % ophthalmic suspension Place 1 drop into the right eye 4 times daily.    . rizatriptan (MAXALT-MLT) 10 MG disintegrating tablet Take 1 tablet (10 mg total) by mouth as needed for migraine. May repeat in 2 hours if needed.  Max of 2 tablets in 24 hours, and 10 tablets per month 10 tablet 11  . temazepam (RESTORIL) 15 MG capsule Take 1 capsule (15 mg total) by mouth at bedtime as needed for sleep. 30 capsule 2  . tiZANidine (ZANAFLEX) 2 MG tablet Take up to 4 pills a day 120 tablet 11  . vitamin E 1000 UNIT capsule Take 1,000 Units by mouth daily.     [name] Kitchen zolpidem (AMBIEN) 10 MG tablet Take 10 mg by mouth at bedtime as needed. For sleep     No facility-administered medications prior to visit.    PAST MEDICAL HISTORY: Past Medical History:  Diagnosis Date  . Allergy   . Anxiety   . Avascular necrosis ([place])   . Back pain   . Cancer ([place])   . Chest pain   . Chewing difficulty   . Chronic fatigue syndrome   . Colon polyp    pre-cancerous  . Complication of anesthesia    patient states she requires more anesthesia  . Depression   . Disorder of soft tissue   . Diverticulosis   . Dizziness   . Edema, lower extremity   . Fatty liver   . Fibromyalgia   . GERD  (gastroesophageal reflux disease)   . H/O breast biopsy    pre- cancerous cells  . Hand fracture, left    9/87  . Heart palpitations   . Heart valve disease   . History of Holter monitoring   . Hyperlipidemia   . Insomnia   . Joint pain   . Lactose intolerance   . Lhermitte's syndrome   . Migraines   . Multiple food allergies   . Multiple sclerosis ([place])    dx in 11/1996  . Multiple sclerosis ([place])   . Neuromuscular disorder ([place])   . Neuropathy   . Numbness and tingling in hands   . Obesity   . Optic neuritis   . OSA (obstructive sleep apnea)    no longer have OSA since stopped taking a MS medication  . Osteoarthritis   . Otitis media   . Palpitations   . Prediabetes   . Raynaud's phenomenon   . Shortness of breath   . Sleep apnea    Due to medication that she no longer takes.  . Swallowing difficulty   .  Vaginitis and vulvovaginitis   . Vitamin B12 deficiency   . Vitamin D deficiency     PAST SURGICAL HISTORY: Past Surgical History:  Procedure Laterality Date  . ABDOMINAL HYSTERECTOMY Bilateral 02/12/2016   Procedure: HYSTERECTOMY ABDOMINAL WITH BILATERAL SALPINGO OOPHERECTOMY;  Surgeon: Dian Queen, MD;  Location: [facility];  Service: Gynecology;  Laterality: Bilateral;  . BREAST EXCISIONAL BIOPSY     core/left breast  . CATARACT EXTRACTION, BILATERAL  06/2019  . CHOLECYSTECTOMY  1983  . COLONOSCOPY     1997/2001  . DILATION AND CURETTAGE OF UTERUS    . ENDOMETRIAL ABLATION  2012  . LAPAROSCOPIC ABDOMINAL EXPLORATION     ovaries and intestines bond together  . LEFT HEART CATHETERIZATION WITH CORONARY ANGIOGRAM N/A 06/21/2011   Procedure: LEFT HEART CATHETERIZATION WITH CORONARY ANGIOGRAM;  Surgeon: Leonie Man, MD;  Location: [facility] LAB;  Service: Cardiovascular;  Laterality: N/A;  . LUMBAR PUNCTURE     11/01/1996  . SALPINGOOPHORECTOMY Bilateral 02/12/2016   Procedure: BILATERAL SALPINGO OOPHORECTOMY;  Surgeon: Dian Queen, MD;  Location: [facility];   Service: Gynecology;  Laterality: Bilateral;  . TOTAL HIP ARTHROPLASTY     left hip  6/02  /right hip12/04    FAMILY HISTORY: Family History  Problem Relation Age of Onset  . Pancreatic cancer Mother   . Stroke Mother   . Hypertension Mother   . Hyperlipidemia Mother   . Heart disease Mother   . Cancer Mother   . Depression Mother   . Anxiety disorder Mother   . Alcoholism Mother   . Testicular cancer Brother   . Colon cancer Maternal Uncle   . Pancreatic cancer Paternal Uncle   . Prostate cancer Maternal Grandfather   . Colon polyps Father   . Diabetes Father   . Hypertension Father   . Hyperlipidemia Father   . Kidney disease Father   . Sleep apnea Father   . Obesity Father     SOCIAL HISTORY: Social History   Socioeconomic History  . Marital status: Married    Spouse name: Not on file  . Number of children: Not on file  . Years of education: Not on file  . Highest education level: Not on file  Occupational History  . Occupation: disabled  Tobacco Use  . Smoking status: Former Smoker    Packs/day: 0.25    Years: 8.00    Pack years: 2.00    Types: Cigarettes    Quit date: 07/01/1995    Years since quitting: 25.0  . Smokeless tobacco: Never Used  Vaping Use  . Vaping Use: Never used  Substance and Sexual Activity  . Alcohol use: No    Alcohol/week: 0.0 standard drinks  . Drug use: Yes    Types: Hydrocodone  . Sexual activity: Not Currently  Other Topics Concern  . Not on file  Social History Narrative  . Not on file   Social Determinants of Health   Financial Resource Strain: Low Risk   . Difficulty of Paying Living Expenses: Not hard at all  Food Insecurity: Food Insecurity Present  . Worried About Charity fundraiser in the Last Year: Sometimes true  . Ran Out of Food in the Last Year: Never true  Transportation Needs: No Transportation Needs  . Lack of Transportation (Medical): No  . Lack of Transportation (Non-Medical): No  Physical  Activity: Inactive  . Days of Exercise per Week: 0 days  . Minutes of Exercise per Session: 0 min  Stress: No  Stress Concern Present  . Feeling of Stress : Not at all  Social Connections: Not on file  Intimate Partner Violence: Not on file      PHYSICAL EXAM  There were no vitals filed for this visit. There is no height or weight on file to calculate BMI.  Generalized: Well developed, in no acute distress  Cardiology: normal rate and rhythm, no murmur noted Neurological examination  Mentation: Alert oriented to time, place, history taking. Follows all commands speech and language fluent Cranial nerve II-XII: Pupils were equal round reactive to light. Extraocular movements were full, visual field were full on confrontational test. Facial sensation and strength were normal. Uvula tongue midline. Head turning and shoulder shrug  were normal and symmetric. Motor: The motor testing reveals 5 over 5 strength of all 4 extremities. Good symmetric motor tone is noted throughout.  Sensory: Sensory testing is intact to soft touch on all 4 extremities. No evidence of extinction is noted. Patient reports numbness of medial aspect of bilateral fifth digit from PIP joint distally  Coordination: Cerebellar testing reveals good finger-nose-finger and heel-to-shin bilaterally.  Gait and station: Gait is normal. Tandem gait is normal. Romberg is negative. No drift is seen.    DIAGNOSTIC DATA (LABS, IMAGING, TESTING) - I reviewed patient records, labs, notes, testing and imaging myself where available.  No flowsheet data found.   Lab Results  Component Value Date   WBC 10.3 11/22/2019   HGB 13.7 11/22/2019   HCT 40.3 11/22/2019   MCV 93 11/22/2019   PLT 275 11/22/2019      Component Value Date/Time   NA 137 04/16/2020 1635   K 4.4 04/16/2020 1635   CL 100 04/16/2020 1635   CO2 23 04/16/2020 1635   GLUCOSE 84 04/16/2020 1635   GLUCOSE 120 (H) 02/13/2016 0459   BUN 15 04/16/2020 1635    CREATININE 0.71 04/16/2020 1635   CALCIUM 10.0 04/16/2020 1635   PROT 7.6 04/16/2020 1635   ALBUMIN 4.7 04/16/2020 1635   AST 20 04/16/2020 1635   ALT 24 04/16/2020 1635   ALKPHOS 111 04/16/2020 1635   BILITOT 0.5 04/16/2020 1635   GFRNONAA 96 04/16/2020 1635   GFRAA 110 04/16/2020 1635   Lab Results  Component Value Date   CHOL 205 (H) 11/22/2019   HDL 108 11/22/2019   LDLCALC 71 11/22/2019   TRIG 161 (H) 11/22/2019   CHOLHDL 1.9 11/22/2019   Lab Results  Component Value Date   HGBA1C 5.6 04/16/2020   Lab Results  Component Value Date   VITAMINB12 519 01/15/2020   Lab Results  Component Value Date   TSH 1.500 11/22/2019     ASSESSMENT AND PLAN 60 y.o. year old female  has a past medical history of Allergy, Anxiety, Avascular necrosis ([place]), Back pain, Cancer ([place]), Chest pain, Chewing difficulty, Chronic fatigue syndrome, Colon polyp, Complication of anesthesia, Depression, Disorder of soft tissue, Diverticulosis, Dizziness, Edema, lower extremity, Fatty liver, Fibromyalgia, GERD (gastroesophageal reflux disease), H/O breast biopsy, Hand fracture, left, Heart palpitations, Heart valve disease, History of Holter monitoring, Hyperlipidemia, Insomnia, Joint pain, Lactose intolerance, Lhermitte's syndrome, Migraines, Multiple food allergies, Multiple sclerosis (HCC), Multiple sclerosis (HCC), Neuromuscular disorder (HCC), Neuropathy, Numbness and tingling in hands, Obesity, Optic neuritis, OSA (obstructive sleep apnea), Osteoarthritis, Otitis media, Palpitations, Prediabetes, Raynaud's phenomenon, Shortness of breath, Sleep apnea, Swallowing difficulty, Vaginitis and vulvovaginitis, Vitamin B12 deficiency, and Vitamin D deficiency. here with   No diagnosis found.       Karynn is fairly stable  overall. She does have concerns of new numbness to bilateral fifth digits and increased insomnia. After consultation with Dr Felecia Shelling, we will treat with prednisone taper pack and will change  doxepin to trazodone 100mg  nightly. She was educated on plan and verbalizes understanding. She was encouraged to continue regular activity and stay well hydrated. She will follow up in 3 months with Dr Felecia Shelling, sooner if needed.    No orders of the defined types were placed in this encounter.    No orders of the defined types were placed in this encounter.     I spent 15 minutes with the patient. 50% of this time was spent counseling and educating patient on plan of care and medications.     Debbora Presto, FNP-C 06/25/2020, 9:19 AM [facility] [address], [place], [place] [address] [phone]

## 2020-06-26 ENCOUNTER — Encounter: Payer: Self-pay | Admitting: Nurse Practitioner

## 2020-06-26 ENCOUNTER — Telehealth: Payer: Medicare Other

## 2020-06-26 ENCOUNTER — Other Ambulatory Visit: Payer: Self-pay

## 2020-06-26 ENCOUNTER — Telehealth (INDEPENDENT_AMBULATORY_CARE_PROVIDER_SITE_OTHER): Payer: Medicare Other | Admitting: Nurse Practitioner

## 2020-06-26 VITALS — BP 160/81 | HR 72

## 2020-06-26 DIAGNOSIS — R059 Cough, unspecified: Secondary | ICD-10-CM | POA: Diagnosis not present

## 2020-06-26 DIAGNOSIS — Z20822 Contact with and (suspected) exposure to covid-19: Secondary | ICD-10-CM | POA: Diagnosis not present

## 2020-06-26 DIAGNOSIS — Z9189 Other specified personal risk factors, not elsewhere classified: Secondary | ICD-10-CM

## 2020-06-26 LAB — NOVEL CORONAVIRUS, NAA: SARS-CoV-2, NAA: NOT DETECTED

## 2020-06-26 LAB — SARS-COV-2, NAA 2 DAY TAT

## 2020-06-26 NOTE — Progress Notes (Signed)
Virtual Visit via MyChart   This visit type was conducted due to national recommendations for restrictions regarding the COVID-19 Pandemic (e.g. social distancing) in an effort to limit this patient's exposure and mitigate transmission in our community.  Due to her co-morbid illnesses, this patient is at least at moderate risk for complications without adequate follow up.  This format is felt to be most appropriate for this patient at this time.  All issues noted in this document were discussed and addressed.  A limited physical exam was performed with this format.    This visit type was conducted due to national recommendations for restrictions regarding the COVID-19 Pandemic (e.g. social distancing) in an effort to limit this patient's exposure and mitigate transmission in our community.  Patients identity confirmed using two different identifiers.  This format is felt to be most appropriate for this patient at this time.  All issues noted in this document were discussed and addressed.  No physical exam was performed (except for noted visual exam findings with Video Visits).    Date:  07/07/2020   ID:  Grace [name], DOB 05-10-1961, MRN [id number]  Patient Location:  Home - spoke with Lorenso Quarry  Provider location:   Office    Chief Complaint:  Covid exposure  History of Present Illness:    Grace [name] is a 60 y.o. female who presents via video conferencing for a telehealth visit today.    The patient does not have symptoms concerning for COVID-19 infection (fever, chills, cough, or new shortness of breath).   Her husband is positive for covid. She was sick at the beginning of January.   Her husband tested positive on Tuesday after sitting in the waiting room, she was with him in the waiting room. She has not been vaccinated. She had an allergic reaction to flu shot years ago and she got really sick. She felt like she had double flu.  Her Neurologist recommended  she not get the covid vaccine.  She has a history of MS.      Past Medical History:  Diagnosis Date  . Allergy   . Anxiety   . Avascular necrosis ([place])   . Back pain   . Cancer ([place])   . Chest pain   . Chewing difficulty   . Chronic fatigue syndrome   . Colon polyp    pre-cancerous  . Complication of anesthesia    patient states she requires more anesthesia  . Depression   . Disorder of soft tissue   . Diverticulosis   . Dizziness   . Edema, lower extremity   . Fatty liver   . Fibromyalgia   . GERD (gastroesophageal reflux disease)   . H/O breast biopsy    pre- cancerous cells  . Hand fracture, left    9/87  . Heart palpitations   . Heart valve disease   . History of Holter monitoring   . Hyperlipidemia   . Insomnia   . Joint pain   . Lactose intolerance   . Lhermitte's syndrome   . Migraines   . Multiple food allergies   . Multiple sclerosis ([place])    dx in 11/1996  . Multiple sclerosis ([place])   . Neuromuscular disorder ([place])   . Neuropathy   . Numbness and tingling in hands   . Obesity   . Optic neuritis   . OSA (obstructive sleep apnea)    no longer have OSA since stopped taking a MS medication  .  Osteoarthritis   . Otitis media   . Palpitations   . Prediabetes   . Raynaud's phenomenon   . Shortness of breath   . Sleep apnea    Due to medication that she no longer takes.  . Swallowing difficulty   . Vaginitis and vulvovaginitis   . Vitamin B12 deficiency   . Vitamin D deficiency    Past Surgical History:  Procedure Laterality Date  . ABDOMINAL HYSTERECTOMY Bilateral 02/12/2016   Procedure: HYSTERECTOMY ABDOMINAL WITH BILATERAL SALPINGO OOPHERECTOMY;  Surgeon: Dian Queen, MD;  Location: [facility];  Service: Gynecology;  Laterality: Bilateral;  . BREAST EXCISIONAL BIOPSY     core/left breast  . CATARACT EXTRACTION, BILATERAL  06/2019  . CHOLECYSTECTOMY  1983  . COLONOSCOPY     1997/2001  . DILATION AND CURETTAGE OF UTERUS    . ENDOMETRIAL  ABLATION  2012  . LAPAROSCOPIC ABDOMINAL EXPLORATION     ovaries and intestines bond together  . LEFT HEART CATHETERIZATION WITH CORONARY ANGIOGRAM N/A 06/21/2011   Procedure: LEFT HEART CATHETERIZATION WITH CORONARY ANGIOGRAM;  Surgeon: Leonie Man, MD;  Location: [facility] C[facility];  Service: Cardiovascular;  Laterality: N/A;  . LUMBAR PUNCTURE     11/01/1996  . SALPINGOOPHORECTOMY Bilateral 02/12/2016   Procedure: BILATERAL SALPINGO OOPHORECTOMY;  Surgeon: Dian Queen, MD;  Location: [facility];  Service: Gynecology;  Laterality: Bilateral;  . TOTAL HIP ARTHROPLASTY     left hip  6/02  /right hip12/04     Current Meds  Medication Sig  . Ascorbic Acid (VITAMIN C) 1000 MG tablet Take 1,000 mg by mouth daily.  [name] Kitchen aspirin EC 81 MG tablet Take 81 mg by mouth daily.  . baclofen (LIORESAL) 10 MG tablet Take 2 tablets (20 mg total) by mouth 3 (three) times daily.  . Cholecalciferol (VITAMIN D) 50 MCG (2000 UT) CAPS Take 2,000 Units by mouth daily.   [name] Kitchen CINNAMON PO Take 1,200 mg by mouth daily.   . diphenhydrAMINE (BENADRYL) 25 MG tablet Take 25 mg by mouth 3 (three) times daily. Takes with Hydrocodone  . doxepin (SINEQUAN) 10 MG capsule TAKE 1 TO 2 CAPSULES AT BEDTIME  . ELDERBERRY PO Take by mouth daily.  . Evening Primrose Oil 1000 MG CAPS 1300mg  daily  . famotidine (PEPCID) 20 MG tablet Take 1 tablet (20 mg total) by mouth 2 (two) times daily. (Patient taking differently: Take 20 mg by mouth daily.)  . Flaxseed, Linseed, (FLAX SEED OIL) 1000 MG CAPS Take 1,200 mg by mouth daily.   [name] Kitchen gabapentin (NEURONTIN) 300 MG capsule Take 1 capsule (300 mg total) by mouth 3 (three) times daily.  . Grape Seed 50 MG CAPS Take 2 capsules by mouth daily.   [name] Kitchen HYDROcodone-acetaminophen (NORCO/VICODIN) 5-325 MG tablet Take 1 tablet by mouth 3 (three) times daily as needed for moderate pain.  [name] Kitchen ipratropium (ATROVENT) 0.06 % nasal spray Place 2 sprays into both nostrils 2 (two) times daily.  [name] Kitchen L-Lysine 1000 MG TABS  Take by mouth. As needed  . Lactobacillus (PROBIOTIC ACIDOPHILUS PO) Take by mouth daily. Digestive Advantage  . levocetirizine (XYZAL) 5 MG tablet TAKE 1 TABLET BY MOUTH EVERY DAY IN THE EVENING  . loratadine-pseudoephedrine (QC LORATADINE-D) 10-240 MG 24 hr tablet Take 1 tablet by mouth daily.  [name] Kitchen LORazepam (ATIVAN) 1 MG tablet Take 1 tablet (1 mg total) by mouth at bedtime.  . Lutein-Bilberry (BILBERRY PLUS LUTEIN PO) Take by mouth daily.  . Melatonin 10 MG TABS Take 1 tablet by mouth at  bedtime.   . methylPREDNISolone (MEDROL) 4 MG tablet Take 6 tablets on day 1 and decrease by 1 tablet daily until gone.  . Moringa 500 MG CAPS Take 2 capsules by mouth daily.  [name] Kitchen nystatin-triamcinolone ointment (MYCOLOG) Apply 1 application topically 2 (two) times daily.  . rizatriptan (MAXALT-MLT) 10 MG disintegrating tablet Take 1 tablet (10 mg total) by mouth as needed for migraine. May repeat in 2 hours if needed.  Max of 2 tablets in 24 hours, and 10 tablets per month  . timolol (BETIMOL) 0.5 % ophthalmic solution Place 1 drop into the right eye 2 (two) times daily.  [name] Kitchen tiZANidine (ZANAFLEX) 2 MG tablet Take up to 4 pills a day  . vitamin E 1000 UNIT capsule Take 1,000 Units by mouth daily.   [name] Kitchen zolpidem (AMBIEN) 10 MG tablet Take 10 mg by mouth at bedtime as needed. For sleep     Allergies:   Lamictal [lamotrigine], Mobic [meloxicam], Ultram [tramadol hcl], Codeine, Diclofenac, Latex, Nitrofurantoin, Teriflunomide, Trazodone and nefazodone, and Tape   Social History   Tobacco Use  . Smoking status: Former Smoker    Packs/day: 0.25    Years: 8.00    Pack years: 2.00    Types: Cigarettes    Quit date: 07/01/1995    Years since quitting: 25.0  . Smokeless tobacco: Never Used  Vaping Use  . Vaping Use: Never used  Substance Use Topics  . Alcohol use: No    Alcohol/week: 0.0 standard drinks  . Drug use: Yes    Types: Hydrocodone     Family Hx: The patient's family history includes Alcoholism in  her mother; Anxiety disorder in her mother; Cancer in her mother; Colon cancer in her maternal uncle; Colon polyps in her father; Depression in her mother; Diabetes in her father; Heart disease in her mother; Hyperlipidemia in her father and mother; Hypertension in her father and mother; Kidney disease in her father; Obesity in her father; Pancreatic cancer in her mother and paternal uncle; Prostate cancer in her maternal grandfather; Sleep apnea in her father; Stroke in her mother; Testicular cancer in her brother.  ROS:   Please see the history of present illness.    Review of Systems  Constitutional: Negative.   Respiratory: Negative.   Cardiovascular: Negative.   Musculoskeletal: Negative.   Neurological: Negative.   Psychiatric/Behavioral: Negative.     All other systems reviewed and are negative.   Labs/Other Tests and Data Reviewed:    Recent Labs: 11/22/2019: Hemoglobin 13.7; Platelets 275; TSH 1.500 04/16/2020: ALT 24; BUN 15; Creatinine, Ser 0.71; Magnesium 2.1; Potassium 4.4; Sodium 137   Recent Lipid Panel Lab Results  Component Value Date/Time   CHOL 205 (H) 11/22/2019 05:10 PM   TRIG 161 (H) 11/22/2019 05:10 PM   HDL 108 11/22/2019 05:10 PM   CHOLHDL 1.9 11/22/2019 05:10 PM   LDLCALC 71 11/22/2019 05:10 PM    Wt Readings from Last 3 Encounters:  04/30/20 208 lb (94.3 kg)  04/16/20 211 lb (95.7 kg)  04/15/20 206 lb (93.4 kg)     Exam:    Vital Signs:  BP (!) 160/81 (BP Location: Right Arm, Patient Position: Sitting, Cuff Size: Large)   Pulse 72   LMP 04/09/2013     Physical Exam Vitals reviewed.  Constitutional:      Appearance: Normal appearance.  Cardiovascular:     Rate and Rhythm: Normal rate and regular rhythm.     Pulses: Normal pulses.     Heart sounds:  Normal heart sounds. No murmur heard.   Pulmonary:     Effort: Pulmonary effort is normal. No respiratory distress.     Breath sounds: Normal breath sounds. No wheezing.  Neurological:      General: No focal deficit present.     Mental Status: She is alert and oriented to person, place, and time.     Cranial Nerves: No cranial nerve deficit.  Psychiatric:        Mood and Affect: Mood normal.        Behavior: Behavior normal.        Thought Content: Thought content normal.        Judgment: Judgment normal.     ASSESSMENT & PLAN:    1. At risk for allergic reaction to medication  Referral to allergist due to multiple allergies including the flu vaccine want to see if she is able to get the covid vaccine - Ambulatory referral to Allergy  2. Close exposure to COVID-19 virus  Her husband is diagnosed with covid in the hospital  Her test is negative.  She is to continue to monitor for any symptoms     COVID-19 Education: The signs and symptoms of COVID-19 were discussed with the patient and how to seek care for testing (follow up with PCP or arrange E-visit).  The importance of social distancing was discussed today.  Patient Risk:   After full review of this patients clinical status, I feel that they are at least moderate risk at this time.  Time:   Today, I have spent 10 minutes/ seconds with the patient with telehealth technology discussing above diagnoses.     Medication Adjustments/Labs and Tests Ordered: Current medicines are reviewed at length with the patient today.  Concerns regarding medicines are outlined above.   Tests Ordered: Orders Placed This Encounter  Procedures  . Ambulatory referral to Allergy    Medication Changes: No orders of the defined types were placed in this encounter.   Disposition:  Follow up prn  Signed, Minette Brine, FNP

## 2020-06-30 ENCOUNTER — Ambulatory Visit: Payer: Self-pay

## 2020-06-30 ENCOUNTER — Telehealth: Payer: Medicare Other

## 2020-06-30 DIAGNOSIS — E6609 Other obesity due to excess calories: Secondary | ICD-10-CM

## 2020-06-30 DIAGNOSIS — G35 Multiple sclerosis: Secondary | ICD-10-CM

## 2020-06-30 DIAGNOSIS — F3342 Major depressive disorder, recurrent, in full remission: Secondary | ICD-10-CM

## 2020-06-30 DIAGNOSIS — E559 Vitamin D deficiency, unspecified: Secondary | ICD-10-CM

## 2020-06-30 DIAGNOSIS — R7303 Prediabetes: Secondary | ICD-10-CM

## 2020-07-01 ENCOUNTER — Ambulatory Visit (INDEPENDENT_AMBULATORY_CARE_PROVIDER_SITE_OTHER): Payer: Medicare Other

## 2020-07-01 DIAGNOSIS — F3342 Major depressive disorder, recurrent, in full remission: Secondary | ICD-10-CM | POA: Diagnosis not present

## 2020-07-01 DIAGNOSIS — G35 Multiple sclerosis: Secondary | ICD-10-CM

## 2020-07-01 NOTE — Patient Instructions (Signed)
Goals we discussed today:  Goals Addressed            This Visit's Progress   . Caregiver coping optimized       Timeframe:  Long-Range Goal Priority:  High Start Date:  2.22.22                           Expected End Date:   5.23.22                    Next planned outreach: 3.2.22  Patient Goals/Self-Care Activities Over the next 10 days, patient will:   - Patient will self administer medications as prescribed Patient will attend all scheduled provider appointments Patient will call provider office for new concerns or questions Meet with [place] Worker to discuss discharge planning for spouse Contact SW as needed prior to next scheduled call

## 2020-07-01 NOTE — Chronic Care Management (AMB) (Signed)
Chronic Care Management    Social Work Note  07/01/2020 Name: Grace [name] MRN: [id number] DOB: 11-13-1960  Grace [name] is a 60 y.o. year old female who is a primary care patient of [name], MD. The CCM team was consulted to assist the patient with chronic disease management and/or care coordination needs related to: Caregiver Stress.   Engaged with patient by telephone for follow up visit in response to provider referral for social work chronic care management and care coordination services.   Consent to Services:  The patient was given information about Chronic Care Management services, agreed to services, and gave verbal consent prior to initiation of services.  Please see initial visit note for detailed documentation.   Patient agreed to services and consent obtained.   Assessment: Review of patient past medical history, allergies, medications, and health status, including review of relevant consultants reports was performed today as part of a comprehensive evaluation and provision of chronic care management and care coordination services.     SDOH (Social Determinants of Health) assessments and interventions performed:    Advanced Directives Status: Not addressed in this encounter.  CCM Care Plan  Allergies  Allergen Reactions  . Lamictal [Lamotrigine] Shortness Of Breath, Itching and Swelling    Tongue swells  . Mobic [Meloxicam] Anaphylaxis  . Ultram [Tramadol Hcl] Anaphylaxis  . Codeine Itching  . Diclofenac Itching  . Latex     Latex band-aids cause redness and tears your skin  . Nitrofurantoin     Causing itching all over, tongue swelling, chest heaviness, swollen lips, cough, rapid HR, swollen eyelids, red eyes/gums, lips red/white per pt.  . Teriflunomide Diarrhea    Hair loss, joint pains, elevated liver enzymes abugio (joint pains and liver issues)  . Trazodone And Nefazodone     Mouth sores, tongue swelling  . Tape Rash    Outpatient  Encounter Medications as of 07/01/2020  Medication Sig  . Ascorbic Acid (VITAMIN C) 1000 MG tablet Take 1,000 mg by mouth daily.  [name] Kitchen aspirin EC 81 MG tablet Take 81 mg by mouth daily.  . baclofen (LIORESAL) 10 MG tablet Take 2 tablets (20 mg total) by mouth 3 (three) times daily.  . Cholecalciferol (VITAMIN D) 50 MCG (2000 UT) CAPS Take 2,000 Units by mouth daily.   [name] Kitchen CINNAMON PO Take 1,200 mg by mouth daily.   . diphenhydrAMINE (BENADRYL) 25 MG tablet Take 25 mg by mouth 3 (three) times daily. Takes with Hydrocodone  . doxepin (SINEQUAN) 10 MG capsule TAKE 1 TO 2 CAPSULES AT BEDTIME  . ELDERBERRY PO Take by mouth daily.  [name] Kitchen etodolac (LODINE) 500 MG tablet Take 500 mg by mouth. 1-2 times a day (Patient not taking: Reported on 06/26/2020)  . Evening Primrose Oil 1000 MG CAPS 1300mg  daily  . famotidine (PEPCID) 20 MG tablet Take 1 tablet (20 mg total) by mouth 2 (two) times daily. (Patient taking differently: Take 20 mg by mouth daily.)  . Flaxseed, Linseed, (FLAX SEED OIL) 1000 MG CAPS Take 1,200 mg by mouth daily.   [name] Kitchen gabapentin (NEURONTIN) 300 MG capsule Take 1 capsule (300 mg total) by mouth 3 (three) times daily.  . Grape Seed 50 MG CAPS Take 2 capsules by mouth daily.   [name] Kitchen HYDROcodone-acetaminophen (NORCO/VICODIN) 5-325 MG tablet Take 1 tablet by mouth 3 (three) times daily as needed for moderate pain.  [name] Kitchen ipratropium (ATROVENT) 0.06 % nasal spray Place 2 sprays into both nostrils 2 (two) times daily.  [name] Kitchen L-Lysine  1000 MG TABS Take by mouth. As needed  . Lactobacillus (PROBIOTIC ACIDOPHILUS PO) Take by mouth daily. Digestive Advantage  . levocetirizine (XYZAL) 5 MG tablet TAKE 1 TABLET BY MOUTH EVERY DAY IN THE EVENING  . loratadine-pseudoephedrine (QC LORATADINE-D) 10-240 MG 24 hr tablet Take 1 tablet by mouth daily.  [name] Kitchen LORazepam (ATIVAN) 1 MG tablet Take 1 tablet (1 mg total) by mouth at bedtime.  . Lutein-Bilberry (BILBERRY PLUS LUTEIN PO) Take by mouth daily.  . Melatonin 10 MG TABS Take 1  tablet by mouth at bedtime.   . metFORMIN (GLUCOPHAGE) 500 MG tablet 1 tab daily with lunch (Patient not taking: Reported on 06/26/2020)  . methylPREDNISolone (MEDROL) 4 MG tablet Take 6 tablets on day 1 and decrease by 1 tablet daily until gone.  . Moringa 500 MG CAPS Take 2 capsules by mouth daily.  [name] Kitchen moxifloxacin (VIGAMOX) 0.5 % ophthalmic solution Place into the right eye in the morning and at bedtime. (Patient not taking: Reported on 06/26/2020)  . Multiple Vitamins-Minerals (HAIR/SKIN/NAILS/BIOTIN) TABS Take by mouth daily.   [name] Kitchen nystatin-triamcinolone ointment (MYCOLOG) Apply 1 application topically 2 (two) times daily.  . prednisoLONE acetate (PRED FORTE) 1 % ophthalmic suspension Place 1 drop into the right eye 4 times daily. (Patient not taking: Reported on 06/26/2020)  . rizatriptan (MAXALT-MLT) 10 MG disintegrating tablet Take 1 tablet (10 mg total) by mouth as needed for migraine. May repeat in 2 hours if needed.  Max of 2 tablets in 24 hours, and 10 tablets per month  . temazepam (RESTORIL) 15 MG capsule Take 1 capsule (15 mg total) by mouth at bedtime as needed for sleep. (Patient not taking: Reported on 06/26/2020)  . timolol (BETIMOL) 0.5 % ophthalmic solution Place 1 drop into the right eye 2 (two) times daily.  [name] Kitchen tiZANidine (ZANAFLEX) 2 MG tablet Take up to 4 pills a day  . vitamin E 1000 UNIT capsule Take 1,000 Units by mouth daily.   [name] Kitchen zolpidem (AMBIEN) 10 MG tablet Take 10 mg by mouth at bedtime as needed. For sleep   No facility-administered encounter medications on file as of 07/01/2020.    Patient Active Problem List   Diagnosis Date Noted  . At risk for heart disease 04/15/2020  . Other hyperlipidemia with hypertriglyceridemia 04/15/2020  . Prediabetes 01/01/2020  . Vitamin D deficiency 01/01/2020  . Vitamin B 12 deficiency 01/01/2020  . At risk for diabetes mellitus 01/01/2020  . Class 1 obesity with serious comorbidity and body mass index (BMI) of 32.0 to 32.9 in adult  01/01/2020  . 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 Disturbance of smell and taste 11/15/2019  . Depression 03/06/2019  . Diverticulosis 06/14/2018  . Hepatic steatosis 06/14/2018  . 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 Decreased pedal pulses 06/30/2016  . Pain in both lower extremities 06/30/2016  . Facial pain, atypical 06/08/2016  . Fibroids 02/12/2016  . Chest pain 07/07/2015  . Dizziness and giddiness 07/07/2015  . Leiomyoma of uterus 07/07/2015  . Lower abdominal pain 07/07/2015  . Hip pain 03/03/2015  . 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 Multiple sclerosis ([place]) 06/26/2014  . 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 Other fatigue 06/26/2014  . Dysesthesia 06/26/2014  . Depression with anxiety 06/26/2014  . 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 Insomnia 06/26/2014  . Low serum vitamin D 06/26/2014    Conditions to be addressed/monitored: Depression and MS; Limited social support and Caregiver Stress  Care Plan : Social Work [place]  Updates made by Daneen Schick since  07/01/2020 12:00 AM    Problem: Caregiver Stress     Long-Range Goal: Caregiver Coping Optimized   Start Date: 07/01/2020  Expected End Date: 09/29/2020  Priority: High  Note:   Current Barriers:  . Limited social support . Caregiver Stress with limited ability to care for spouse . Chronic disease management support and education needs related to Depression and MS    Social Work Clinical Goal(s):  [name] Kitchen Over the next 90 days, patient will work with SW to address concerns related to caregiver stress  Interventions: . 1:1 collaboration with [name], MD regarding development and update of comprehensive plan of care as evidenced by provider attestation and co-signature . Inter-disciplinary care team collaboration (see longitudinal plan of care) . Collaboration with [place] who requests SW outreach to assist with caregiver stress . Successful outbound call placed to the patient to assess for care  coordination needs . Discussed the patients spouse is currently in rehab at [facility] following a recent hospital stay. The health plan has issued a notice of non-payment effective 3.4.22. The patients spouse is currently not standing or transferring on his own . Determined the patient is concerned with her ability to provide care to her spouse upon his return home . Discussed opportunity to apply for long-term care Medicaid; concern over ability to obtain coverage due to spouse assets including an IRA . Advised the patient she could contact [employer] for a free telephone interview to assess options; patient reports she has contacted a Chief Executive Officer in the past several months who advised her on steps then requested $5,000 to assist the patient- she is unable to afford this type of service . Determined the patient has a scheduled meeting planned for Monday February 28th with the social worker at [facility] to discuss discharge planning . Patient reports her spouse has "given up" and does not want to participate in therapy while in rehab or once he returns home with home health . Educated the patient on [place] that she may want to discuss during Monday meeting to determine her spouse's eligibility  . Patient reports she currently has a bedside commode and broken hospital bed in the home - no family members available to assist with the care of her spouse . Determined she would like to wait until meeting with SNF Social Worker prior to determining options for caregiver stress . Discussed the patient has difficulty cleaning and is interested in resources to assist prior to her spouse returning home . Advised the patient resources do not exist unless she would like to pay out of pocket; patient reports she can not afford to pay out of pocket . Encouraged the patient to focus on 1-2 rooms prior to her spouse returning home . Scheduled follow up call for 3.2.22 to determine outcome of discharge planning  meeting  Patient Goals/Self-Care Activities Over the next 10 days, patient will:   - Patient will self administer medications as prescribed Patient will attend all scheduled provider appointments Patient will call provider office for new concerns or questions Meet with [place] Worker to discuss discharge planning for spouse Contact SW as needed prior to next scheduled call  Follow up Plan: Appointment scheduled for SW follow up with client by phone on: 3.2.22       Follow Up Plan: Appointment scheduled for SW follow up with client by phone on: 3.2.22      Daneen Schick, BSW, CDP Social Worker, Teacher, adult education /  [place] [facility] [phone]  Total time spent performing care coordination and/or care management activities with the patient by phone or face to face = 74 minutes.

## 2020-07-02 NOTE — Patient Instructions (Signed)
Goals Addressed    . Caregiver Stress minimized   Not on track    Timeframe:  Long-Range Goal Priority:  High Start Date: 06/30/20                            Expected End Date: 12/29/20    Next Follow Up Date: 08/25/20  Patient Goals/Self Care Activities:        . Work with the embedded CCM team for Care Coordination needs, Wellsite geologist and Horticulturist, commercial  . Notify PCP and or CCM team of new or worsening symptoms of anxiety and or depression

## 2020-07-02 NOTE — Chronic Care Management (AMB) (Signed)
Chronic Care Management   CCM RN Visit Note  06/30/2020 Name: Grace [name] MRN: [id number] DOB: Dec 11, 1960  Subjective: Grace [name] is a 60 y.o. year old female who is a primary care patient of [name], MD. The care management team was consulted for assistance with disease management and care coordination needs.    Engaged with patient by telephone for follow up visit in response to provider referral for case management and/or care coordination services.   Consent to Services:  The patient was given information about Chronic Care Management services, agreed to services, and gave verbal consent prior to initiation of services.  Please see initial visit note for detailed documentation.   Patient agreed to services and verbal consent obtained.   Assessment: Review of patient past medical history, allergies, medications, health status, including review of consultants reports, laboratory and other test data, was performed as part of comprehensive evaluation and provision of chronic care management services.   SDOH (Social Determinants of Health) assessments and interventions performed:  Yes, see care plan   CCM Care Plan  Allergies  Allergen Reactions  . Lamictal [Lamotrigine] Shortness Of Breath, Itching and Swelling    Tongue swells  . Mobic [Meloxicam] Anaphylaxis  . Ultram [Tramadol Hcl] Anaphylaxis  . Codeine Itching  . Diclofenac Itching  . Latex     Latex band-aids cause redness and tears your skin  . Nitrofurantoin     Causing itching all over, tongue swelling, chest heaviness, swollen lips, cough, rapid HR, swollen eyelids, red eyes/gums, lips red/white per pt.  . Teriflunomide Diarrhea    Hair loss, joint pains, elevated liver enzymes abugio (joint pains and liver issues)  . Trazodone And Nefazodone     Mouth sores, tongue swelling  . Tape Rash    Outpatient Encounter Medications as of 06/30/2020  Medication Sig  . Ascorbic Acid (VITAMIN C)  1000 MG tablet Take 1,000 mg by mouth daily.  [name] Kitchen aspirin EC 81 MG tablet Take 81 mg by mouth daily.  . baclofen (LIORESAL) 10 MG tablet Take 2 tablets (20 mg total) by mouth 3 (three) times daily.  . Cholecalciferol (VITAMIN D) 50 MCG (2000 UT) CAPS Take 2,000 Units by mouth daily.   [name] Kitchen CINNAMON PO Take 1,200 mg by mouth daily.   . diphenhydrAMINE (BENADRYL) 25 MG tablet Take 25 mg by mouth 3 (three) times daily. Takes with Hydrocodone  . doxepin (SINEQUAN) 10 MG capsule TAKE 1 TO 2 CAPSULES AT BEDTIME  . ELDERBERRY PO Take by mouth daily.  [name] Kitchen etodolac (LODINE) 500 MG tablet Take 500 mg by mouth. 1-2 times a day (Patient not taking: Reported on 06/26/2020)  . Evening Primrose Oil 1000 MG CAPS 1300mg  daily  . famotidine (PEPCID) 20 MG tablet Take 1 tablet (20 mg total) by mouth 2 (two) times daily. (Patient taking differently: Take 20 mg by mouth daily.)  . Flaxseed, Linseed, (FLAX SEED OIL) 1000 MG CAPS Take 1,200 mg by mouth daily.   [name] Kitchen gabapentin (NEURONTIN) 300 MG capsule Take 1 capsule (300 mg total) by mouth 3 (three) times daily.  . Grape Seed 50 MG CAPS Take 2 capsules by mouth daily.   [name] Kitchen HYDROcodone-acetaminophen (NORCO/VICODIN) 5-325 MG tablet Take 1 tablet by mouth 3 (three) times daily as needed for moderate pain.  [name] Kitchen ipratropium (ATROVENT) 0.06 % nasal spray Place 2 sprays into both nostrils 2 (two) times daily.  [name] Kitchen L-Lysine 1000 MG TABS Take by mouth. As needed  . Lactobacillus (PROBIOTIC ACIDOPHILUS PO) Take  by mouth daily. Digestive Advantage  . levocetirizine (XYZAL) 5 MG tablet TAKE 1 TABLET BY MOUTH EVERY DAY IN THE EVENING  . loratadine-pseudoephedrine (QC LORATADINE-D) 10-240 MG 24 hr tablet Take 1 tablet by mouth daily.  [name] Kitchen LORazepam (ATIVAN) 1 MG tablet Take 1 tablet (1 mg total) by mouth at bedtime.  . Lutein-Bilberry (BILBERRY PLUS LUTEIN PO) Take by mouth daily.  . Melatonin 10 MG TABS Take 1 tablet by mouth at bedtime.   . metFORMIN (GLUCOPHAGE) 500 MG tablet 1 tab daily with  lunch (Patient not taking: Reported on 06/26/2020)  . methylPREDNISolone (MEDROL) 4 MG tablet Take 6 tablets on day 1 and decrease by 1 tablet daily until gone.  . Moringa 500 MG CAPS Take 2 capsules by mouth daily.  [name] Kitchen moxifloxacin (VIGAMOX) 0.5 % ophthalmic solution Place into the right eye in the morning and at bedtime. (Patient not taking: Reported on 06/26/2020)  . Multiple Vitamins-Minerals (HAIR/SKIN/NAILS/BIOTIN) TABS Take by mouth daily.   [name] Kitchen nystatin-triamcinolone ointment (MYCOLOG) Apply 1 application topically 2 (two) times daily.  . prednisoLONE acetate (PRED FORTE) 1 % ophthalmic suspension Place 1 drop into the right eye 4 times daily. (Patient not taking: Reported on 06/26/2020)  . rizatriptan (MAXALT-MLT) 10 MG disintegrating tablet Take 1 tablet (10 mg total) by mouth as needed for migraine. May repeat in 2 hours if needed.  Max of 2 tablets in 24 hours, and 10 tablets per month  . temazepam (RESTORIL) 15 MG capsule Take 1 capsule (15 mg total) by mouth at bedtime as needed for sleep. (Patient not taking: Reported on 06/26/2020)  . timolol (BETIMOL) 0.5 % ophthalmic solution Place 1 drop into the right eye 2 (two) times daily.  [name] Kitchen tiZANidine (ZANAFLEX) 2 MG tablet Take up to 4 pills a day  . vitamin E 1000 UNIT capsule Take 1,000 Units by mouth daily.   [name] Kitchen zolpidem (AMBIEN) 10 MG tablet Take 10 mg by mouth at bedtime as needed. For sleep   No facility-administered encounter medications on file as of 06/30/2020.    Patient Active Problem List   Diagnosis Date Noted  . At risk for heart disease 04/15/2020  . Other hyperlipidemia with hypertriglyceridemia 04/15/2020  . Prediabetes 01/01/2020  . Vitamin D deficiency 01/01/2020  . Vitamin B 12 deficiency 01/01/2020  . At risk for diabetes mellitus 01/01/2020  . Class 1 obesity with serious comorbidity and body mass index (BMI) of 32.0 to 32.9 in adult 01/01/2020  . 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 Disturbance of smell and taste 11/15/2019  .  Depression 03/06/2019  . Diverticulosis 06/14/2018  . Hepatic steatosis 06/14/2018  . 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 Decreased pedal pulses 06/30/2016  . Pain in both lower extremities 06/30/2016  . Facial pain, atypical 06/08/2016  . Fibroids 02/12/2016  . Chest pain 07/07/2015  . Dizziness and giddiness 07/07/2015  . Leiomyoma of uterus 07/07/2015  . Lower abdominal pain 07/07/2015  . Hip pain 03/03/2015  . 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 Multiple sclerosis ([place]) 06/26/2014  . 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 Other fatigue 06/26/2014  . Dysesthesia 06/26/2014  . Depression with anxiety 06/26/2014  . 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 Insomnia 06/26/2014  . Low serum vitamin D 06/26/2014    Conditions to be addressed/monitored:MS, Vitamin D Deficiency, Recurrent major depression, Class 1 Obesity due to excess calories, Prediabetes  Care Plan : General Plan of Care (Adult)  Updates made by Lynne Logan, RN since 07/02/2020 12:00 AM    Problem: Caregiver  Sress   Priority: High    Long-Range Goal: Caregiver Stress Managed   Start Date: 06/30/2020  Expected End Date: 12/29/2020  This Visit's Progress: On track  Priority: High  Note:   Current Barriers:   Ineffective Self Health Maintenance  Lacks social connections  Currently UNABLE TO independently self manage needs related to chronic health conditions.   Knowledge Deficits related to short term plan for care coordination needs and long term plans for chronic disease management needs Clinical Goal(s):  [name] Kitchen Collaboration with [name], MD regarding development and update of comprehensive plan of care as evidenced by provider attestation and co-signature . Inter-disciplinary care team collaboration (see longitudinal plan of care)  Over the next 180 days, patient will work with care management team to address care coordination and chronic disease management needs related to Educational Needs  Care  Coordination  Psychosocial Support  Caregiver Stress support   Interventions:  06/30/20 successful call completed with patient  Evaluation of current treatment plan related to Caregiver Stress/Burnout self-management and patient's adherence to plan as established by provider.  Collaboration with [name], MD regarding development and update of comprehensive plan of care as evidenced by provider attestation       and co-signature  Inter-disciplinary care team collaboration (see longitudinal plan of care)  Discussed patient is experiencing increased caregiver stress following a recent event with her spouse requiring him to be hospitalized at which upon he was discharged to inpatient rehab   Determined patient is concerned she will not be able to care for her husband upon his discharge home due to he is having a decline in functional ability since being IP and also now having tested positive for COVID 19  Discussed having patient collaborate with the embedded BSW for resources and support in preparation to care for her spouse once he returns home  Sent in basket message to embedded BSW Daneen Schick requesting she outreach to patient at next availability to offer resources and assistance related to caregiver concerns and custodial care needs for housekeeping   Discussed plans with patient for ongoing care management follow up and provided patient with direct contact information for care management team Patient Goals/Self Care Activities:  Over the next 180 days, patient will:  . Work with the embedded CCM team for Care Coordination needs, Wellsite geologist and Horticulturist, commercial  . Notify PCP and or CCM team of new or worsening symptoms of anxiety and or depression   Follow Up Plan: Telephone follow up appointment with care management team member scheduled for: 08/25/20    Plan:Telephone follow up appointment with care management team member scheduled for:  08/25/20  Barb Merino, RN, BSN, CCM Care Management Coordinator [place] [facility]  Direct Phone: [phone]

## 2020-07-07 ENCOUNTER — Encounter: Payer: Self-pay | Admitting: Nurse Practitioner

## 2020-07-09 ENCOUNTER — Telehealth: Payer: Self-pay

## 2020-07-09 ENCOUNTER — Telehealth: Payer: Medicare Other

## 2020-07-09 NOTE — Telephone Encounter (Signed)
  Chronic Care Management   Outreach Note  07/09/2020 Name: Grace [name] MRN: [id number] DOB: [birth year]  Referred by: [name], MD Reason for referral : Chronic Care Management   An unsuccessful telephone outreach was attempted today. The patient was referred to the case management team for assistance with care management and care coordination.   Follow Up Plan: The care management team will reach out to the patient again over the next 10 days.   Daneen Schick, BSW, CDP Social Worker, Certified Dementia Practitioner [place] / [place] [facility] [phone]

## 2020-07-14 ENCOUNTER — Ambulatory Visit (INDEPENDENT_AMBULATORY_CARE_PROVIDER_SITE_OTHER): Payer: Medicare Other

## 2020-07-14 ENCOUNTER — Ambulatory Visit (INDEPENDENT_AMBULATORY_CARE_PROVIDER_SITE_OTHER): Payer: Medicare Other | Admitting: Psychology

## 2020-07-14 DIAGNOSIS — F3342 Major depressive disorder, recurrent, in full remission: Secondary | ICD-10-CM | POA: Diagnosis not present

## 2020-07-14 DIAGNOSIS — G35 Multiple sclerosis: Secondary | ICD-10-CM

## 2020-07-14 DIAGNOSIS — F064 Anxiety disorder due to known physiological condition: Secondary | ICD-10-CM | POA: Diagnosis not present

## 2020-07-14 NOTE — Patient Instructions (Signed)
   Goals we discussed today:  Goals Addressed            This Visit's Progress   . Caregiver coping optimized       Timeframe:  Long-Range Goal Priority:  High Start Date:  2.22.22                           Expected End Date:   5.23.22                    Next planned outreach: 3.10.22   Patient Goals/Self-Care Activities Over the next 5 days, patient will:   - Patient will self administer medications as prescribed Patient will attend all scheduled provider appointments Patient will call provider office for new concerns or questions Contact her husbands primary care provider to report concerns with ability to care in the home to determine alternative resources for assistance Contact SW as needed prior to next scheduled call

## 2020-07-14 NOTE — Chronic Care Management (AMB) (Signed)
Chronic Care Management    Social Work Note  07/14/2020 Name: Grace [name] MRN: [id number] DOB: [birth year]  Grace [name] is a 60 y.o. year old female who is a primary care patient of [name], MD. The CCM team was consulted to assist the patient with chronic disease management and/or care coordination needs related to: Caregiver Stress.   Engaged with patient by telephone for follow up visit in response to provider referral for social work chronic care management and care coordination services.   Consent to Services:  The patient was given information about Chronic Care Management services, agreed to services, and gave verbal consent prior to initiation of services.  Please see initial visit note for detailed documentation.   Patient agreed to services and consent obtained.   Assessment: Review of patient past medical history, allergies, medications, and health status, including review of relevant consultants reports was performed today as part of a comprehensive evaluation and provision of chronic care management and care coordination services.     SDOH (Social Determinants of Health) assessments and interventions performed:    Advanced Directives Status: Not addressed in this encounter.  CCM Care Plan  Allergies  Allergen Reactions  . Lamictal [Lamotrigine] Shortness Of Breath, Itching and Swelling    Tongue swells  . Mobic [Meloxicam] Anaphylaxis  . Ultram [Tramadol Hcl] Anaphylaxis  . Codeine Itching  . Diclofenac Itching  . Latex     Latex band-aids cause redness and tears your skin  . Nitrofurantoin     Causing itching all over, tongue swelling, chest heaviness, swollen lips, cough, rapid HR, swollen eyelids, red eyes/gums, lips red/white per pt.  . Teriflunomide Diarrhea    Hair loss, joint pains, elevated liver enzymes abugio (joint pains and liver issues)  . Trazodone And Nefazodone     Mouth sores, tongue swelling  . Tape Rash    Outpatient  Encounter Medications as of 07/14/2020  Medication Sig  . Ascorbic Acid (VITAMIN C) 1000 MG tablet Take 1,000 mg by mouth daily.  [name] Kitchen aspirin EC 81 MG tablet Take 81 mg by mouth daily.  . baclofen (LIORESAL) 10 MG tablet Take 2 tablets (20 mg total) by mouth 3 (three) times daily.  . Cholecalciferol (VITAMIN D) 50 MCG (2000 UT) CAPS Take 2,000 Units by mouth daily.   [name] Kitchen CINNAMON PO Take 1,200 mg by mouth daily.   . diphenhydrAMINE (BENADRYL) 25 MG tablet Take 25 mg by mouth 3 (three) times daily. Takes with Hydrocodone  . doxepin (SINEQUAN) 10 MG capsule TAKE 1 TO 2 CAPSULES AT BEDTIME  . ELDERBERRY PO Take by mouth daily.  [name] Kitchen etodolac (LODINE) 500 MG tablet Take 500 mg by mouth. 1-2 times a day (Patient not taking: Reported on 06/26/2020)  . Evening Primrose Oil 1000 MG CAPS 1300mg  daily  . famotidine (PEPCID) 20 MG tablet Take 1 tablet (20 mg total) by mouth 2 (two) times daily. (Patient taking differently: Take 20 mg by mouth daily.)  . Flaxseed, Linseed, (FLAX SEED OIL) 1000 MG CAPS Take 1,200 mg by mouth daily.   [name] Kitchen gabapentin (NEURONTIN) 300 MG capsule Take 1 capsule (300 mg total) by mouth 3 (three) times daily.  . Grape Seed 50 MG CAPS Take 2 capsules by mouth daily.   [name] Kitchen HYDROcodone-acetaminophen (NORCO/VICODIN) 5-325 MG tablet Take 1 tablet by mouth 3 (three) times daily as needed for moderate pain.  [name] Kitchen ipratropium (ATROVENT) 0.06 % nasal spray Place 2 sprays into both nostrils 2 (two) times daily.  [name] Kitchen L-Lysine  1000 MG TABS Take by mouth. As needed  . Lactobacillus (PROBIOTIC ACIDOPHILUS PO) Take by mouth daily. Digestive Advantage  . levocetirizine (XYZAL) 5 MG tablet TAKE 1 TABLET BY MOUTH EVERY DAY IN THE EVENING  . loratadine-pseudoephedrine (QC LORATADINE-D) 10-240 MG 24 hr tablet Take 1 tablet by mouth daily.  [name] Kitchen LORazepam (ATIVAN) 1 MG tablet Take 1 tablet (1 mg total) by mouth at bedtime.  . Lutein-Bilberry (BILBERRY PLUS LUTEIN PO) Take by mouth daily.  . Melatonin 10 MG TABS Take 1  tablet by mouth at bedtime.   . metFORMIN (GLUCOPHAGE) 500 MG tablet 1 tab daily with lunch (Patient not taking: Reported on 06/26/2020)  . methylPREDNISolone (MEDROL) 4 MG tablet Take 6 tablets on day 1 and decrease by 1 tablet daily until gone.  . Moringa 500 MG CAPS Take 2 capsules by mouth daily.  [name] Kitchen moxifloxacin (VIGAMOX) 0.5 % ophthalmic solution Place into the right eye in the morning and at bedtime. (Patient not taking: Reported on 06/26/2020)  . Multiple Vitamins-Minerals (HAIR/SKIN/NAILS/BIOTIN) TABS Take by mouth daily.   [name] Kitchen nystatin-triamcinolone ointment (MYCOLOG) Apply 1 application topically 2 (two) times daily.  . prednisoLONE acetate (PRED FORTE) 1 % ophthalmic suspension Place 1 drop into the right eye 4 times daily. (Patient not taking: Reported on 06/26/2020)  . rizatriptan (MAXALT-MLT) 10 MG disintegrating tablet Take 1 tablet (10 mg total) by mouth as needed for migraine. May repeat in 2 hours if needed.  Max of 2 tablets in 24 hours, and 10 tablets per month  . temazepam (RESTORIL) 15 MG capsule Take 1 capsule (15 mg total) by mouth at bedtime as needed for sleep. (Patient not taking: Reported on 06/26/2020)  . timolol (BETIMOL) 0.5 % ophthalmic solution Place 1 drop into the right eye 2 (two) times daily.  [name] Kitchen tiZANidine (ZANAFLEX) 2 MG tablet Take up to 4 pills a day  . vitamin E 1000 UNIT capsule Take 1,000 Units by mouth daily.   [name] Kitchen zolpidem (AMBIEN) 10 MG tablet Take 10 mg by mouth at bedtime as needed. For sleep   No facility-administered encounter medications on file as of 07/14/2020.    Patient Active Problem List   Diagnosis Date Noted  . At risk for heart disease 04/15/2020  . Other hyperlipidemia with hypertriglyceridemia 04/15/2020  . Prediabetes 01/01/2020  . Vitamin D deficiency 01/01/2020  . Vitamin B 12 deficiency 01/01/2020  . At risk for diabetes mellitus 01/01/2020  . Class 1 obesity with serious comorbidity and body mass index (BMI) of 32.0 to 32.9 in adult  01/01/2020  . 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 Disturbance of smell and taste 11/15/2019  . Depression 03/06/2019  . Diverticulosis 06/14/2018  . Hepatic steatosis 06/14/2018  . 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 Decreased pedal pulses 06/30/2016  . Pain in both lower extremities 06/30/2016  . Facial pain, atypical 06/08/2016  . Fibroids 02/12/2016  . Chest pain 07/07/2015  . Dizziness and giddiness 07/07/2015  . Leiomyoma of uterus 07/07/2015  . Lower abdominal pain 07/07/2015  . Hip pain 03/03/2015  . 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 Multiple sclerosis ([place]) 06/26/2014  . 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 Other fatigue 06/26/2014  . Dysesthesia 06/26/2014  . Depression with anxiety 06/26/2014  . 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 Insomnia 06/26/2014  . Low serum vitamin D 06/26/2014    Conditions to be addressed/monitored: Depression and MS; Caregiver Stress  Care Plan : Social Work [facility] Care Plan  Updates made by Daneen Schick since 07/14/2020 12:00 AM  Problem: Caregiver Stress     Long-Range Goal: Caregiver Coping Optimized   Start Date: 07/01/2020  Expected End Date: 09/29/2020  Priority: High  Note:   Current Barriers:  . Limited social support . Caregiver Stress with limited ability to care for spouse . Chronic disease management support and education needs related to Depression and MS    Social Work Clinical Goal(s):  [name] Kitchen Over the next 90 days, patient will work with SW to address concerns related to caregiver stress  Interventions: . 1:1 collaboration with [name], MD regarding development and update of comprehensive plan of care as evidenced by provider attestation and co-signature . Inter-disciplinary care team collaboration (see longitudinal plan of care) . Successful outbound call placed to the patient to assess for goal progression . Determined the patients spouse returned home on Friday 3/4 with orders for home health . Discussed the patient was visited in the  home on Sunday 3/6 by [place] [facility] and SW. Patient reports she was informed the home health agency did not feel her spouse was safe in the home and could no longer provide services until after he is seen my a medical provider, preferably the ED . Unfortunately, the patients spouse is unable to transfer on his own and required a 3 person max assist to get him into the bed. Patient is concerned with ability to get her spouse to the ED based on ambulation status and cost of transportation. She is also concerned with wait times, exposure to COVID, and does not understand what the ED may do for him . Assessed for interest in Hospice or Palliative care - patient reports she did request a hospice evaluation of her spouse which was performed while he was in rehab. Patient was informed her spouse was not appropriate for hospice but did qualify for Palliative care . Determined the patient has attempted to contact Palliative Care and has left a message requesting a return call . Discussed the patient is experiencing tingling in her hands and feet from stress of caring for her husband. He is experiencing poor intake and unable to assist with and ADL's or iADL's . Advised the patient to contact her spouse's primary care provider to discuss barriers with caring for him in the home as well as decreased intake . Collaboration with Smitty Knudsen  . Scheduled follow up call over the next 5 days  Patient Goals/Self-Care Activities Over the next 5 days, patient will:   - Patient will self administer medications as prescribed Patient will attend all scheduled provider appointments Patient will call provider office for new concerns or questions Contact her husbands primary care provider to report concerns with ability to care in the home to determine alternative resources for assistance Contact SW as needed prior to next scheduled call  Follow up Plan: Appointment scheduled for SW follow up with client by  phone on: 3.10.22       Follow Up Plan: SW will follow up with patient by phone over the next 5 days.      Daneen Schick, BSW, CDP Social Worker, Certified Dementia Practitioner [place] [facility] [phone]  Total time spent performing care coordination and/or care management activities with the patient by phone or face to face = 31 minutes.

## 2020-07-15 ENCOUNTER — Ambulatory Visit: Payer: Medicare Other | Admitting: Psychology

## 2020-07-15 ENCOUNTER — Other Ambulatory Visit: Payer: Self-pay | Admitting: Obstetrics and Gynecology

## 2020-07-15 DIAGNOSIS — N6002 Solitary cyst of left breast: Secondary | ICD-10-CM

## 2020-07-16 ENCOUNTER — Other Ambulatory Visit: Payer: Self-pay

## 2020-07-16 ENCOUNTER — Ambulatory Visit (INDEPENDENT_AMBULATORY_CARE_PROVIDER_SITE_OTHER): Payer: Medicare Other | Admitting: Neurology

## 2020-07-16 ENCOUNTER — Encounter: Payer: Self-pay | Admitting: Neurology

## 2020-07-16 ENCOUNTER — Other Ambulatory Visit: Payer: Self-pay | Admitting: *Deleted

## 2020-07-16 VITALS — BP 130/78 | HR 83 | Ht 67.0 in | Wt 210.5 lb

## 2020-07-16 DIAGNOSIS — G373 Acute transverse myelitis in demyelinating disease of central nervous system: Secondary | ICD-10-CM

## 2020-07-16 DIAGNOSIS — R200 Anesthesia of skin: Secondary | ICD-10-CM

## 2020-07-16 DIAGNOSIS — G35 Multiple sclerosis: Secondary | ICD-10-CM

## 2020-07-16 MED ORDER — GABAPENTIN 300 MG PO CAPS: 300.0000 mg | ORAL_CAPSULE | Freq: Three times a day (TID) | ORAL | 11 refills | Status: DC

## 2020-07-16 MED ORDER — METHYLPREDNISOLONE 4 MG PO TABS: ORAL_TABLET | ORAL | 0 refills | Status: DC

## 2020-07-16 MED ORDER — HYDROCODONE-ACETAMINOPHEN 10-325 MG PO TABS: ORAL_TABLET | ORAL | 0 refills | Status: DC

## 2020-07-16 MED ORDER — HYDROCODONE-ACETAMINOPHEN 5-325 MG PO TABS
1.0000 | ORAL_TABLET | Freq: Three times a day (TID) | ORAL | 0 refills | Status: DC | PRN
Start: 2020-07-16 — End: 2020-09-22

## 2020-07-16 NOTE — Progress Notes (Signed)
[facility]  PATIENT: Grace [name] DOB: 11/22/1960  REFERRING DOCTOR OR PCP:  [name] SOURCE: patient  _________________________________   HISTORICAL  CHIEF COMPLAINT:  Chief Complaint  Patient presents with  . Follow-up    RM 13. Last seen 02/20/2020. Off DMT for MS. Hands/feet are tingling, still has loss of taste.     HISTORY OF PRESENT ILLNESS:  Grace [name] is a 60 y.o. woman with relapsing remitting multiple sclerosis.     Update 07/17/2019: She is off any DMT for MS and has felt neurologically stable.   She has a lot more stress with there husband's health issues.  He is not eating well now.    She is having more tingling in her arms and has a tight sensation there and also across her chest.    She feels her hand grip is reduced.    Legs are doing well.    She has urinary urgency which is worse.    Her gait is mildly off balance but no falls.  She continues to experience some dysesthesias especially in the hands.  She has pain in the neck, back.  She gets some benefit from gabapentin and hydrocodone.  She continues to have vision problems since cataract surgery and complications with elevated IOP.    She is sleeping poorly despite hydrocodone, baclofen, doxepin, melatonin, benadryl at night.  Temazepma helped her fall asleep but not stay asleep.     Ambien had helped her some in the past but she is concerned about side effects (one of her friends had sleep-driving and wrecked).       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 She had optic neuritis in 1999. In 2002, she had right-sided numbness. MRIs of the brain show just a couple of nonspecific foci. A cervical spine MRI showed a focus consistent with demyelination at C5. Additionally, there are 2 smaller foci at T2 and T3-T4.  CSF was also abnormal c/w MS.     She was initially placed on Betaseron and then on Copaxone. She was unable  to tolerate either of these and has not been on any DMT medication since 2003.   She retried Betaseron but stopped after diagnosis of avascular necrosis requiring hip replacement.  She was on Copaxone for a while.   Of note, she had had only one course of IV steroids prior to that and has had some afterwards.   She had not wanted to go on Tysabri or Gilenya and opted to go on no disease modifying therapy since 2012. At her last visit on 01/17/2014, she decided to go on Aubagio but due to elevated liver function test stopped..     Most recent MRIs are from earlier this year and showed no changes.  She has been off of all disease modifying therapy since 2018.  02/05/2018: Brain MRI showed "Multiple T2/FLAIR hyperintense foci in the hemispheres.  This is a nonspecific finding and could be due to chronic microvascular ischemic changes or 2 demyelination.  None of the foci appears to be acute.  When compared to the MRI dated 07/03/2016, there is no interval change.  There is a normal enhancement pattern.   REVIEW OF SYSTEMS: Constitutional: No fevers, chills, sweats, or change in appetite.  Has fatigue Eyes: No visual changes, double vision, eye pain Ear, nose and throat: No hearing loss, ear pain, nasal congestion, sore throat Cardiovascular: No chest  pain, palpitations Respiratory: No shortness of breath at rest or with exertion.   No wheezes GastrointestinaI: No nausea, vomiting, diarrhea, abdominal pain, fecal incontinence Genitourinary: No dysuria, urinary retention or frequency.  No nocturia. Musculoskeletal: She reports back pain and pain and some of her joints, especially her knees and hips. Integumentary: No rash, pruritus, skin lesions Neurological: as above.  She reports restless leg, insomnia and excessive daytime sleepiness. Psychiatric: Has had depression and anxiety.  Endocrine: No palpitations, diaphoresis, change in appetite, change in weigh.  He notes heat intolerance and excessive  thirst at times. Hematologic/Lymphatic: No anemia, purpura, petechiae. Allergic/Immunologic: No itchy/runny eyes, nasal congestion, recent allergic reactions, rashes  ALLERGIES: Allergies  Allergen Reactions  . Lamictal [Lamotrigine] Shortness Of Breath, Itching and Swelling    Tongue swells  . Mobic [Meloxicam] Anaphylaxis  . Ultram [Tramadol Hcl] Anaphylaxis  . Codeine Itching  . Diclofenac Itching  . Latex     Latex band-aids cause redness and tears your skin  . Nitrofurantoin     Causing itching all over, tongue swelling, chest heaviness, swollen lips, cough, rapid HR, swollen eyelids, red eyes/gums, lips red/white per pt.  . Teriflunomide Diarrhea    Hair loss, joint pains, elevated liver enzymes abugio (joint pains and liver issues)  . Trazodone And Nefazodone     Mouth sores, tongue swelling  . Tape Rash    HOME MEDICATIONS:  Current Outpatient Medications:  .  Ascorbic Acid (VITAMIN C) 1000 MG tablet, Take 1,000 mg by mouth daily., Disp: , Rfl:  .  aspirin EC 81 MG tablet, Take 81 mg by mouth daily., Disp: , Rfl:  .  baclofen (LIORESAL) 10 MG tablet, Take 2 tablets (20 mg total) by mouth 3 (three) times daily., Disp: 180 tablet, Rfl: 11 .  Cholecalciferol (VITAMIN D) 50 MCG (2000 UT) CAPS, Take 2,000 Units by mouth daily. , Disp: , Rfl:  .  CINNAMON PO, Take 1,200 mg by mouth daily. , Disp: , Rfl:  .  diphenhydrAMINE (BENADRYL) 25 MG tablet, Take 25 mg by mouth 3 (three) times daily. Takes with Hydrocodone, Disp: , Rfl:  .  doxepin (SINEQUAN) 10 MG capsule, TAKE 1 TO 2 CAPSULES AT BEDTIME, Disp: 180 capsule, Rfl: 3 .  ELDERBERRY PO, Take by mouth daily., Disp: , Rfl:  .  etodolac (LODINE) 500 MG tablet, Take 500 mg by mouth. 1-2 times a day (Patient not taking: Reported on 06/26/2020), Disp: , Rfl:  .  Evening Primrose Oil 1000 MG CAPS, 1300mg  daily, Disp: , Rfl:  .  famotidine (PEPCID) 20 MG tablet, Take 1 tablet (20 mg total) by mouth 2 (two) times daily. (Patient  taking differently: Take 20 mg by mouth daily.), Disp: 180 tablet, Rfl: 1 .  Flaxseed, Linseed, (FLAX SEED OIL) 1000 MG CAPS, Take 1,200 mg by mouth daily. , Disp: , Rfl:  .  gabapentin (NEURONTIN) 300 MG capsule, Take 1 capsule (300 mg total) by mouth 3 (three) times daily., Disp: 90 capsule, Rfl: 11 .  Grape Seed 50 MG CAPS, Take 2 capsules by mouth daily. , Disp: , Rfl:  .  HYDROcodone-acetaminophen (NORCO/VICODIN) 5-325 MG tablet, Take 1 tablet by mouth 3 (three) times daily as needed for moderate pain., Disp: 90 tablet, Rfl: 0 .  ipratropium (ATROVENT) 0.06 % nasal spray, Place 2 sprays into both nostrils 2 (two) times daily., Disp: , Rfl:  .  L-Lysine 1000 MG TABS, Take by mouth. As needed, Disp: , Rfl:  .  Lactobacillus (PROBIOTIC ACIDOPHILUS PO),  Take by mouth daily. Digestive Advantage, Disp: , Rfl:  .  levocetirizine (XYZAL) 5 MG tablet, TAKE 1 TABLET BY MOUTH EVERY DAY IN THE EVENING, Disp: 90 tablet, Rfl: 2 .  loratadine-pseudoephedrine (QC LORATADINE-D) 10-240 MG 24 hr tablet, Take 1 tablet by mouth daily., Disp: 90 tablet, Rfl: 2 .  LORazepam (ATIVAN) 1 MG tablet, Take 1 tablet (1 mg total) by mouth at bedtime., Disp: 30 tablet, Rfl: 5 .  Lutein-Bilberry (BILBERRY PLUS LUTEIN PO), Take by mouth daily., Disp: , Rfl:  .  Melatonin 10 MG TABS, Take 1 tablet by mouth at bedtime. , Disp: , Rfl:  .  metFORMIN (GLUCOPHAGE) 500 MG tablet, 1 tab daily with lunch (Patient not taking: Reported on 06/26/2020), Disp: 30 tablet, Rfl: 0 .  methylPREDNISolone (MEDROL) 4 MG tablet, Take 6 tablets on day 1 and decrease by 1 tablet daily until gone., Disp: 21 tablet, Rfl: 0 .  Moringa 500 MG CAPS, Take 2 capsules by mouth daily., Disp: , Rfl:  .  Multiple Vitamins-Minerals (HAIR/SKIN/NAILS/BIOTIN) TABS, Take by mouth daily. , Disp: , Rfl:  .  nystatin-triamcinolone ointment (MYCOLOG), Apply 1 application topically 2 (two) times daily., Disp: 30 g, Rfl: 2 .  rizatriptan (MAXALT-MLT) 10 MG disintegrating  tablet, Take 1 tablet (10 mg total) by mouth as needed for migraine. May repeat in 2 hours if needed.  Max of 2 tablets in 24 hours, and 10 tablets per month, Disp: 10 tablet, Rfl: 11 .  temazepam (RESTORIL) 15 MG capsule, Take 1 capsule (15 mg total) by mouth at bedtime as needed for sleep. (Patient not taking: Reported on 06/26/2020), Disp: 30 capsule, Rfl: 2 .  timolol (BETIMOL) 0.5 % ophthalmic solution, Place 1 drop into the right eye 2 (two) times daily., Disp: , Rfl:  .  tiZANidine (ZANAFLEX) 2 MG tablet, Take up to 4 pills a day, Disp: 120 tablet, Rfl: 11 .  vitamin E 1000 UNIT capsule, Take 1,000 Units by mouth daily. , Disp: , Rfl:  .  zolpidem (AMBIEN) 10 MG tablet, Take 10 mg by mouth at bedtime as needed. For sleep, Disp: , Rfl:   PAST MEDICAL HISTORY: Past Medical History:  Diagnosis Date  . Allergy   . Anxiety   . Avascular necrosis ([place])   . Back pain   . Cancer ([place])   . Chest pain   . Chewing difficulty   . Chronic fatigue syndrome   . Colon polyp    pre-cancerous  . Complication of anesthesia    patient states she requires more anesthesia  . Depression   . Disorder of soft tissue   . Diverticulosis   . Dizziness   . Edema, lower extremity   . Fatty liver   . Fibromyalgia   . GERD (gastroesophageal reflux disease)   . H/O breast biopsy    pre- cancerous cells  . Hand fracture, left    9/87  . Heart palpitations   . Heart valve disease   . History of Holter monitoring   . Hyperlipidemia   . Insomnia   . Joint pain   . Lactose intolerance   . Lhermitte's syndrome   . Migraines   . Multiple food allergies   . Multiple sclerosis ([place])    dx in 11/1996  . Multiple sclerosis ([place])   . Neuromuscular disorder ([place])   . Neuropathy   . Numbness and tingling in hands   . Obesity   . Optic neuritis   . OSA (obstructive sleep apnea)  no longer have OSA since stopped taking a MS medication  . Osteoarthritis   . Otitis media   . Palpitations   .  Prediabetes   . Raynaud's phenomenon   . Shortness of breath   . Sleep apnea    Due to medication that she no longer takes.  . Swallowing difficulty   . Vaginitis and vulvovaginitis   . Vitamin B12 deficiency   . Vitamin D deficiency     PAST SURGICAL HISTORY: Past Surgical History:  Procedure Laterality Date  . ABDOMINAL HYSTERECTOMY Bilateral 02/12/2016   Procedure: HYSTERECTOMY ABDOMINAL WITH BILATERAL SALPINGO OOPHERECTOMY;  Surgeon: Dian Queen, MD;  Location: [facility];  Service: Gynecology;  Laterality: Bilateral;  . BREAST EXCISIONAL BIOPSY     core/left breast  . CATARACT EXTRACTION, BILATERAL  06/2019  . CHOLECYSTECTOMY  1983  . COLONOSCOPY     1997/2001  . DILATION AND CURETTAGE OF UTERUS    . ENDOMETRIAL ABLATION  2012  . LAPAROSCOPIC ABDOMINAL EXPLORATION     ovaries and intestines bond together  . LEFT HEART CATHETERIZATION WITH CORONARY ANGIOGRAM N/A 06/21/2011   Procedure: LEFT HEART CATHETERIZATION WITH CORONARY ANGIOGRAM;  Surgeon: Leonie Man, MD;  Location: [facility] CATH LAB;  Service: Cardiovascular;  Laterality: N/A;  . LUMBAR PUNCTURE     11/01/1996  . SALPINGOOPHORECTOMY Bilateral 02/12/2016   Procedure: BILATERAL SALPINGO OOPHORECTOMY;  Surgeon: Dian Queen, MD;  Location: [facility];  Service: Gynecology;  Laterality: Bilateral;  . TOTAL HIP ARTHROPLASTY     left hip  6/02  /right hip12/04    FAMILY HISTORY: Family History  Problem Relation Age of Onset  . Pancreatic cancer Mother   . Stroke Mother   . Hypertension Mother   . Hyperlipidemia Mother   . Heart disease Mother   . Cancer Mother   . Depression Mother   . Anxiety disorder Mother   . Alcoholism Mother   . Testicular cancer Brother   . Colon cancer Maternal Uncle   . Pancreatic cancer Paternal Uncle   . Prostate cancer Maternal Grandfather   . Colon polyps Father   . Diabetes Father   . Hypertension Father   . Hyperlipidemia Father   . Kidney disease Father   . Sleep apnea Father    . Obesity Father     SOCIAL HISTORY:  Social History   Socioeconomic History  . Marital status: Married    Spouse name: Not on file  . Number of children: Not on file  . Years of education: Not on file  . Highest education level: Not on file  Occupational History  . Occupation: disabled  Tobacco Use  . Smoking status: Former Smoker    Packs/day: 0.25    Years: 8.00    Pack years: 2.00    Types: Cigarettes    Quit date: 07/01/1995    Years since quitting: 25.0  . Smokeless tobacco: Never Used  Vaping Use  . Vaping Use: Never used  Substance and Sexual Activity  . Alcohol use: No    Alcohol/week: 0.0 standard drinks  . Drug use: Yes    Types: Hydrocodone  . Sexual activity: Not Currently  Other Topics Concern  . Not on file  Social History Narrative  . Not on file   Social Determinants of Health   Financial Resource Strain: Low Risk   . Difficulty of Paying Living Expenses: Not hard at all  Food Insecurity: Food Insecurity Present  . Worried About Charity fundraiser in the  Last Year: Sometimes true  . Ran Out of Food in the Last Year: Never true  Transportation Needs: No Transportation Needs  . Lack of Transportation (Medical): No  . Lack of Transportation (Non-Medical): No  Physical Activity: Inactive  . Days of Exercise per Week: 0 days  . Minutes of Exercise per Session: 0 min  Stress: No Stress Concern Present  . Feeling of Stress : Not at all  Social Connections: Not on file  Intimate Partner Violence: Not on file     PHYSICAL EXAM  Vitals:   07/16/20 1308  BP: 130/78  Pulse: 83  SpO2: 96%  Weight: 210 lb 8 oz (95.5 kg)  Height: 5\' 7"  (1.702 m)    Body mass index is 32.97 kg/m.   General: The patient is well-developed and well-nourished and in no acute distress  Neurologic Exam  Mental status: The patient is alert and oriented x 3 at the time of the examination. The patient has apparent normal recent and remote memory, with an  apparently normal attention span and concentration ability.   Speech is normal.  Cranial nerves: Extraocular movements are full.  She has altered color vision on the right.  Facial strength and sensation was normal.. No obvious hearing deficits are noted.  Motor:  Muscle bulk is normal.   She has increased muscle tone in the legs, left greater than right.  Strength is 5/5 except for the left 4+/5 left EHL  Sensory: Sensory testing shows reduced touch sensation on the right side and reduced vibration on the right..   Coordination: Cerebellar testing reveals good finger-nose-finger and mildly reduced bilateral heel-to-shin  Gait and station: Station is normal.  The gait is mildly wide.  Tandem gait is poor..  Romberg is negative.   Reflexes: Deep tendon reflexes are symmetric and normal bilaterally.         ASSESSMENT AND PLAN  Multiple sclerosis ([place]) - Plan: MR CERVICAL SPINE W WO CONTRAST, NCV with EMG(electromyography)  Transverse myelitis ([place]) - Plan: MR CERVICAL SPINE W WO CONTRAST  Numbness - Plan: MR CERVICAL SPINE W WO CONTRAST, NCV with EMG(electromyography)   1.   She will continue off a DMT for now 2    Due to new symptoms we will check NCV/EMG and cervical spine MRI  --- if new MS plaque consider reinstating DMT.   If due to disc problem may need ESI or referral 3. Continue Hydrocodone 5 mg up to 3 times a day for pain.  The PDMP was reviewed and she is not getting opiates from other doctors.  She does not show any drug-seeking behavior. 5.  She will return to see me in 4 months or sooner if she has new or worsening neurologic symptoms.    Richard A. Felecia Shelling, MD, PhD 09/15/5275, 8:24 PM Certified in Neurology, [facility], Sleep Medicine, Pain Medicine and Neuroimaging  [facility] [address] [place],  [address] [phone]

## 2020-07-17 ENCOUNTER — Telehealth: Payer: Self-pay | Admitting: Neurology

## 2020-07-17 ENCOUNTER — Telehealth: Payer: Self-pay

## 2020-07-17 ENCOUNTER — Ambulatory Visit: Payer: Medicare Other

## 2020-07-17 DIAGNOSIS — G35 Multiple sclerosis: Secondary | ICD-10-CM

## 2020-07-17 DIAGNOSIS — F3342 Major depressive disorder, recurrent, in full remission: Secondary | ICD-10-CM

## 2020-07-17 NOTE — Chronic Care Management (AMB) (Signed)
Chronic Care Management    Social Work Note  07/17/2020 Name: Grace [name] MRN: [id number] DOB: 05-14-1960  Grace [name] is a 60 y.o. year old female who is a primary care patient of [name], MD. The CCM team was consulted to assist the patient with chronic disease management and/or care coordination needs related to: [employer] .   Engaged with patient by telephone for follow up visit in response to provider referral for social work chronic care management and care coordination services.   Consent to Services:  The patient was given information about Chronic Care Management services, agreed to services, and gave verbal consent prior to initiation of services.  Please see initial visit note for detailed documentation.   Patient agreed to services and consent obtained.   Assessment: Review of patient past medical history, allergies, medications, and health status, including review of relevant consultants reports was performed today as part of a comprehensive evaluation and provision of chronic care management and care coordination services.     SDOH (Social Determinants of Health) assessments and interventions performed:    Advanced Directives Status: Not addressed in this encounter.  CCM Care Plan  Allergies  Allergen Reactions  . Lamictal [Lamotrigine] Shortness Of Breath, Itching and Swelling    Tongue swells  . Mobic [Meloxicam] Anaphylaxis  . Ultram [Tramadol Hcl] Anaphylaxis  . Codeine Itching  . Diclofenac Itching  . Latex     Latex band-aids cause redness and tears your skin  . Nitrofurantoin     Causing itching all over, tongue swelling, chest heaviness, swollen lips, cough, rapid HR, swollen eyelids, red eyes/gums, lips red/white per pt.  . Teriflunomide Diarrhea    Hair loss, joint pains, elevated liver enzymes abugio (joint pains and liver issues)  . Trazodone And Nefazodone     Mouth sores, tongue swelling  . Tape Rash     Outpatient Encounter Medications as of 07/17/2020  Medication Sig  . Ascorbic Acid (VITAMIN C) 1000 MG tablet Take 1,000 mg by mouth daily.  [name] Kitchen aspirin EC 81 MG tablet Take 81 mg by mouth daily.  . baclofen (LIORESAL) 10 MG tablet Take 2 tablets (20 mg total) by mouth 3 (three) times daily.  . Cholecalciferol (VITAMIN D) 50 MCG (2000 UT) CAPS Take 2,000 Units by mouth daily.   [name] Kitchen CINNAMON PO Take 1,200 mg by mouth daily.   . diphenhydrAMINE (BENADRYL) 25 MG tablet Take 25 mg by mouth 3 (three) times daily. Takes with Hydrocodone  . doxepin (SINEQUAN) 10 MG capsule TAKE 1 TO 2 CAPSULES AT BEDTIME  . ELDERBERRY PO Take by mouth daily.  [name] Kitchen etodolac (LODINE) 500 MG tablet Take 500 mg by mouth. 1-2 times a day (Patient not taking: Reported on 06/26/2020)  . Evening Primrose Oil 1000 MG CAPS 1300mg  daily  . famotidine (PEPCID) 20 MG tablet Take 1 tablet (20 mg total) by mouth 2 (two) times daily. (Patient taking differently: Take 20 mg by mouth daily.)  . Flaxseed, Linseed, (FLAX SEED OIL) 1000 MG CAPS Take 1,200 mg by mouth daily.   [name] Kitchen gabapentin (NEURONTIN) 300 MG capsule Take 1 capsule (300 mg total) by mouth 3 (three) times daily.  . Grape Seed 50 MG CAPS Take 2 capsules by mouth daily.   [name] Kitchen HYDROcodone-acetaminophen (NORCO) 10-325 MG tablet Take 1/2 tablet by mouth three times daily as needed for moderate pain  . HYDROcodone-acetaminophen (NORCO/VICODIN) 5-325 MG tablet Take 1 tablet by mouth 3 (three) times daily as needed for moderate pain.  [name] Kitchen  ipratropium (ATROVENT) 0.06 % nasal spray Place 2 sprays into both nostrils 2 (two) times daily.  [name] Kitchen L-Lysine 1000 MG TABS Take by mouth. As needed  . Lactobacillus (PROBIOTIC ACIDOPHILUS PO) Take by mouth daily. [employer]vantage  . levocetirizine (XYZAL) 5 MG tablet TAKE 1 TABLET BY MOUTH EVERY DAY IN THE EVENING  . loratadine-pseudoephedrine (QC LORATADINE-D) 10-240 MG 24 hr tablet Take 1 tablet by mouth daily.  [name] Kitchen LORazepam (ATIVAN) 1 MG tablet Take  1 tablet (1 mg total) by mouth at bedtime.  . Lutein-Bilberry (BILBERRY PLUS LUTEIN PO) Take by mouth daily.  . Melatonin 10 MG TABS Take 1 tablet by mouth at bedtime.   . metFORMIN (GLUCOPHAGE) 500 MG tablet 1 tab daily with lunch (Patient not taking: Reported on 06/26/2020)  . methylPREDNISolone (MEDROL) 4 MG tablet Take 6 tablets on day 1 and decrease by 1 tablet daily until gone.  . Moringa 500 MG CAPS Take 2 capsules by mouth daily.  . Multiple Vitamins-Minerals (HAIR/SKIN/NAILS/BIOTIN) TABS Take by mouth daily.   [name] Kitchen nystatin-triamcinolone ointment (MYCOLOG) Apply 1 application topically 2 (two) times daily.  . rizatriptan (MAXALT-MLT) 10 MG disintegrating tablet Take 1 tablet (10 mg total) by mouth as needed for migraine. May repeat in 2 hours if needed.  Max of 2 tablets in 24 hours, and 10 tablets per month  . temazepam (RESTORIL) 15 MG capsule Take 1 capsule (15 mg total) by mouth at bedtime as needed for sleep. (Patient not taking: Reported on 06/26/2020)  . timolol (BETIMOL) 0.5 % ophthalmic solution Place 1 drop into the right eye 2 (two) times daily.  [name] Kitchen tiZANidine (ZANAFLEX) 2 MG tablet Take up to 4 pills a day  . vitamin E 1000 UNIT capsule Take 1,000 Units by mouth daily.   [name] Kitchen zolpidem (AMBIEN) 10 MG tablet Take 10 mg by mouth at bedtime as needed. For sleep   No facility-administered encounter medications on file as of 07/17/2020.    Patient Active Problem List   Diagnosis Date Noted  . At risk for heart disease 04/15/2020  . Other hyperlipidemia with hypertriglyceridemia 04/15/2020  . Prediabetes 01/01/2020  . Vitamin D deficiency 01/01/2020  . Vitamin B 12 deficiency 01/01/2020  . At risk for diabetes mellitus 01/01/2020  . Class 1 obesity with serious comorbidity and body mass index (BMI) of 32.0 to 32.9 in adult 01/01/2020  . 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 Disturbance of smell and taste 11/15/2019  . Depression 03/06/2019  . Diverticulosis 06/14/2018  . Hepatic steatosis  06/14/2018  . 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 Decreased pedal pulses 06/30/2016  . Pain in both lower extremities 06/30/2016  . Facial pain, atypical 06/08/2016  . Fibroids 02/12/2016  . Chest pain 07/07/2015  . Dizziness and giddiness 07/07/2015  . Leiomyoma of uterus 07/07/2015  . Lower abdominal pain 07/07/2015  . Hip pain 03/03/2015  . 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 Multiple sclerosis ([place]) 06/26/2014  . 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 Other fatigue 06/26/2014  . Dysesthesia 06/26/2014  . Depression with anxiety 06/26/2014  . 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 Insomnia 06/26/2014  . Low serum vitamin D 06/26/2014    Conditions to be addressed/monitored: Depression and MS; Caregiver Stress  Care Plan : Social Work [facility] Care Plan  Updates made by Daneen Schick since 07/17/2020 12:00 AM    Problem: Caregiver Stress     Long-Range Goal: Caregiver Coping Optimized   Start Date: 07/01/2020  Expected End Date: 09/29/2020  This Visit's Progress: On track  Priority:  High  Note:   Current Barriers:  . Limited social support . Caregiver Stress with limited ability to care for spouse . Chronic disease management support and education needs related to Depression and MS    Social Work Clinical Goal(s):  [name] Kitchen Over the next 90 days, patient will work with SW to address concerns related to caregiver stress  Interventions: . 1:1 collaboration with [name], MD regarding development and update of comprehensive plan of care as evidenced by provider attestation and co-signature . Inter-disciplinary care team collaboration (see longitudinal plan of care) . Inbound call received from the patient in response to voice message left by SW . Discussed the patient's spouse was assessed by Palliative Care who determined him to be Hospice appropriate . Determined the patient's spouse will receive a CNA in the home 3 days a week to assist with bathing which will alleviate some stress on the  patient . Performed chart review to note patient seen by Neurology for tingling and numbness in hands - MRI ordered . Patient indicates she is awaiting scheduling of MRI but is concerned she may not be able to complete imaging due to braces in her mouth - will discuss with scheduler when she is contacted . Determined the patient has been referred for mammogram and ultrasound due to possible cyst under armpit - scheduled for 4.20.22 . Discussed the patient may lose insurance once her spouse is under Hospice services as she is a rider on his health plan . Advised the patient SW can assist with mammogram scholarship if needed until she obtains her own Medicare coverage . Determined patient is eligible to obtain Medicare based on disability coverage . Scheduled follow up call over the next 21 days  Patient Goals/Self-Care Activities Over the next 21 days, patient will:   - Patient will self administer medications as prescribed Patient will attend all scheduled provider appointments Patient will call provider office for new concerns or questions Follow up with imaging regarding MRI Engage with Hospice program to assist with spouse care needs Contact SW as needed prior to next scheduled call  Follow up Plan: SW will follow up with patient by phone over the next 21 days       Follow Up Plan: SW will follow up with patient by phone over the next 21 days.      Daneen Schick, BSW, CDP Social Worker, Certified Dementia Practitioner [facility] [phone]  Total time spent performing care coordination and/or care management activities with the patient by phone or face to face = 27 minutes.

## 2020-07-17 NOTE — Telephone Encounter (Signed)
[employer] medicare order sent to [facility]. No auth they will reach out to the patient to schedule.  

## 2020-07-17 NOTE — Telephone Encounter (Signed)
  Chronic Care Management   Outreach Note  07/17/2020 Name: Grace [name] MRN: [id number] DOB: Feb 09, 1961  Referred by: [name], MD Reason for referral : Chronic Care Management   An unsuccessful telephone outreach was attempted today. The patient was referred to the case management team for assistance with care management and care coordination.   Follow Up Plan: A HIPAA compliant phone message was left for the patient providing contact information and requesting a return call.  The care management team will reach out to the patient again over the next 10 days.   Daneen Schick, BSW, CDP Social Worker, Certified Dementia Practitioner [place] [phone]

## 2020-07-17 NOTE — Patient Instructions (Signed)
   Goals we discussed today:  Goals Addressed            This Visit's Progress   . Caregiver coping optimized       Timeframe:  Long-Range Goal Priority:  High Start Date:  2.22.22                           Expected End Date:   5.23.22                    Next planned outreach: 3.29.22  Patient Goals/Self-Care Activities Over the next 21 days, patient will:   - Patient will self administer medications as prescribed Patient will attend all scheduled provider appointments Patient will call provider office for new concerns or questions Follow up with imaging regarding MRI Engage with Hospice program to assist with spouse care needs Contact SW as needed prior to next scheduled call

## 2020-07-22 ENCOUNTER — Other Ambulatory Visit: Payer: Self-pay

## 2020-07-22 ENCOUNTER — Ambulatory Visit (INDEPENDENT_AMBULATORY_CARE_PROVIDER_SITE_OTHER): Payer: Medicare Other

## 2020-07-22 VITALS — BP 132/78 | HR 69 | Temp 98.1°F | Ht 67.0 in | Wt 210.6 lb

## 2020-07-22 DIAGNOSIS — E538 Deficiency of other specified B group vitamins: Secondary | ICD-10-CM | POA: Diagnosis not present

## 2020-07-22 MED ORDER — CYANOCOBALAMIN 1000 MCG/ML IJ SOLN
1000.0000 ug | Freq: Once | INTRAMUSCULAR | Status: AC
  Administered 2020-07-22: 1000 ug via INTRAMUSCULAR

## 2020-07-22 NOTE — Progress Notes (Signed)
Pt is here or b12 injection.

## 2020-07-23 ENCOUNTER — Telehealth: Payer: Self-pay

## 2020-07-23 ENCOUNTER — Telehealth: Payer: Medicare Other

## 2020-07-23 NOTE — Telephone Encounter (Signed)
I left a detailed message at the pt's request.  Dr. Baird Cancer wants to know if the pt still needed the famotidine for her reflux and if so can the pt try to stop the medication.  It's a drug interaction between the famotadine and tizanidine.(causes increased plasma concentration of the tizanidine).  Dr. Baird Cancer also wants to know if the pt wanted Hospice counseling, the pt's husband is ill and currently has a couple weeks left to live.

## 2020-07-27 ENCOUNTER — Ambulatory Visit
Admission: RE | Admit: 2020-07-27 | Discharge: 2020-07-27 | Disposition: A | Discharge status: Home / Self Care | Payer: Medicare Other | Source: Ambulatory Visit | Attending: Neurology | Admitting: Neurology

## 2020-07-27 ENCOUNTER — Other Ambulatory Visit: Payer: Self-pay

## 2020-07-27 DIAGNOSIS — G35 Multiple sclerosis: Secondary | ICD-10-CM | POA: Diagnosis not present

## 2020-07-27 DIAGNOSIS — G373 Acute transverse myelitis in demyelinating disease of central nervous system: Secondary | ICD-10-CM

## 2020-07-27 DIAGNOSIS — R200 Anesthesia of skin: Secondary | ICD-10-CM

## 2020-07-27 MED ORDER — GADOBENATE DIMEGLUMINE 529 MG/ML IV SOLN
20.0000 mL | Freq: Once | INTRAVENOUS | Status: AC | PRN
  Administered 2020-07-27: 20 mL via INTRAVENOUS

## 2020-07-29 ENCOUNTER — Encounter: Payer: Self-pay | Admitting: Neurology

## 2020-07-29 ENCOUNTER — Other Ambulatory Visit: Payer: Self-pay

## 2020-07-29 ENCOUNTER — Ambulatory Visit (INDEPENDENT_AMBULATORY_CARE_PROVIDER_SITE_OTHER): Payer: Medicare Other | Admitting: Neurology

## 2020-07-29 ENCOUNTER — Encounter (INDEPENDENT_AMBULATORY_CARE_PROVIDER_SITE_OTHER): Payer: Medicare Other | Admitting: Neurology

## 2020-07-29 DIAGNOSIS — R200 Anesthesia of skin: Secondary | ICD-10-CM

## 2020-07-29 DIAGNOSIS — G35 Multiple sclerosis: Secondary | ICD-10-CM

## 2020-07-29 DIAGNOSIS — Z0289 Encounter for other administrative examinations: Secondary | ICD-10-CM

## 2020-07-29 MED ORDER — OXCARBAZEPINE 300 MG PO TABS: ORAL_TABLET | ORAL | 11 refills | Status: DC

## 2020-07-29 NOTE — Progress Notes (Signed)
Full Name:   Orlene Salmons Gender: Female MRN #:    [id number] Date of Birth: 1960-11-18    Visit Date: 07/29/2020 09:11 Age: 60 Years Examining Physician: Arlice Colt, MD  Referring Physician: Arlice Colt, MD    History: Ms. Mcraney is a 60 year old woman with multiple sclerosis who reports increased numbness and tingling in her arms and legs as well as more weakness.  Additionally she has more intense dysesthetic pain in the left chest.  Nerve conduction studies: The left median, ulnar, peroneal and tibial motor responses had normal distal latencies, amplitudes and conduction velocities.  F-wave latencies were normal.  The left sural, superficial peroneal, median and ulnar sensory responses had normal peak latencies and amplitudes.  Electromyography: Needle EMG of selected muscles of the left arm and leg was performed.  There was no spontaneous activity.  Mild chronic denervation was noted in the abductor hallucis muscle.  Other muscles tested were normal.  Impression: This NCV/EMG study shows the following: 1.   No evidence of neuropathy, significant radiculopathy or myopathy. 2.   Isolated mild chronic denervation in the most muscle tested, the abductor hallucis muscle, could be incidental though a plantar neuropathy cannot be ruled out.  Richard A. Felecia Shelling, MD, PhD, FAAN Certified in Neurology, Clinical Neurophysiology, Sleep Medicine, Pain Medicine and Neuroimaging Director, [facility] [address] [place], [place] [address] [phone]  Clinical Note:   Will start oxcarbazepine 300 mg qAM and 600 mg po qHS) for dysesthesias.  She cannot tolerate gabapentin > 300 mg at a time and had an allergic reaction to lamotrigine.   -- RAS   Verbal informed consent was obtained from the patient, patient was informed of potential risk of procedure, including bruising, bleeding,  hematoma formation, infection, muscle weakness, muscle pain, numbness, among others.        [place]    Nerve / Sites Muscle Latency Ref. Amplitude Ref. Rel Amp Segments Distance Velocity Ref. Area    ms ms mV mV %  cm m/s m/s mVms  L Median - APB     Wrist APB 3.0 ?4.4 8.2 ?4.0 100 Wrist - APB 7   31.4     Upper arm APB 6.6  7.2  88.5 Upper arm - Wrist 21 58 ?49 27.8  L Ulnar - ADM     Wrist ADM 2.4 ?3.3 7.8 ?6.0 100 Wrist - ADM 7   26.9     B.Elbow ADM 6.0  6.8  87.1 B.Elbow - Wrist 18 50 ?49 25.1     A.Elbow ADM 8.0  7.1  104 A.Elbow - B.Elbow 10 50 ?49 25.9  L Peroneal - EDB     Ankle EDB 4.6 ?6.5 6.6 ?2.0 100 Ankle - EDB 9   24.1     Fib head EDB 10.3  6.1  92.1 Fib head - Ankle 28 49 ?44 22.9     Pop fossa EDB 12.4  6.0  99.2 Pop fossa - Fib head 10 47 ?44 22.2         Pop fossa - Ankle      L Tibial - AH     Ankle AH 4.2 ?5.8 4.7 ?4.0 100 Ankle - AH 9   11.3     Pop fossa AH 12.6  3.2  67.9 Pop fossa - Ankle 35 42 ?41 10.0  [place]    Nerve / Sites Rec. Site Peak Lat Ref.  Amp Ref. Segments Distance    ms ms V V  cm  L Sural - Ankle (Calf)     Calf Ankle 3.4 ?4.4 14 ?6 Calf - Ankle 14  L Superficial peroneal - Ankle     Lat leg Ankle 3.4 ?4.4 7 ?6 Lat leg - Ankle 14  L Median - Orthodromic (Dig II, Mid palm)     Dig II Wrist 2.6 ?3.4 15 ?10 Dig II - Wrist 13  L Ulnar - Orthodromic, (Dig V, Mid palm)     Dig V Wrist 2.3 ?3.1 8 ?5 Dig V - Wrist 59             F  Wave    Nerve F Lat Ref.   ms ms  L Tibial - AH 51.4 ?56.0  L Ulnar - ADM 29.5 ?32.0         EMG Summary Table    Spontaneous MUAP Recruitment  Muscle IA Fib PSW Fasc Other Amp Dur. Poly Pattern  L. Deltoid Normal None None None _______ Normal Normal Normal Normal  L. Triceps brachii Normal None None None _______ Normal Normal 1+ Normal  L. Biceps brachii Normal None None None _______ Normal Normal 1+ Normal  L. Extensor digitorum communis Normal None None None _______ Normal Normal Normal Normal   L. First dorsal interosseous Normal None None None _______ Normal Normal Normal Normal  L. Abductor pollicis brevis Normal None None None _______ Normal Normal Normal Normal  L. Vastus medialis Normal None None None _______ Normal Normal Normal Normal  L. Peroneus longus Normal None None None _______ Normal Normal Normal Normal  L. Tibialis anterior Normal None None None _______ Normal Normal Normal Normal  L. Gastrocnemius (Medial head) Normal None None None _______ Normal Normal Normal Normal  L. Abductor hallucis Normal None None None _______ Normal Increased 1+ Reduced

## 2020-08-01 ENCOUNTER — Ambulatory Visit (INDEPENDENT_AMBULATORY_CARE_PROVIDER_SITE_OTHER): Payer: Medicare Other | Admitting: Psychology

## 2020-08-01 DIAGNOSIS — F064 Anxiety disorder due to known physiological condition: Secondary | ICD-10-CM | POA: Diagnosis not present

## 2020-08-04 ENCOUNTER — Telehealth: Payer: Self-pay

## 2020-08-04 NOTE — Telephone Encounter (Signed)
-----   Message from [name], MD sent at 08/01/2020  7:17 PM EDT ----- Regarding: FW: FYI Please call to check on Mrs. Ditommaso, how is she feeling?  RS ----- Message ----- From: Daneen Schick Sent: 07/17/2020   4:10 PM EDT To: [name], MD, Lynne Logan, RN Subject: [place] Patient I spoke with Mrs. Saccente today and her husband is being admitted to Hospice services. He will remain in the home at this time.  Tillie Rung

## 2020-08-04 NOTE — Telephone Encounter (Signed)
Left a message

## 2020-08-05 ENCOUNTER — Ambulatory Visit: Payer: Medicare Other

## 2020-08-05 DIAGNOSIS — F3342 Major depressive disorder, recurrent, in full remission: Secondary | ICD-10-CM | POA: Diagnosis not present

## 2020-08-05 DIAGNOSIS — G35 Multiple sclerosis: Secondary | ICD-10-CM

## 2020-08-05 NOTE — Chronic Care Management (AMB) (Signed)
Chronic Care Management    Social Work Note  08/05/2020 Name: Grace [name] MRN: [id number] DOB: 11-03-1960  Grace [name] is a 60 y.o. year old female who is a primary care patient of [name], MD. The CCM team was consulted to assist the patient with chronic disease management and/or care coordination needs related to: Caregiver Stress.   Engaged with patient by telephone for follow up visit in response to provider referral for social work chronic care management and care coordination services.   Consent to Services:  The patient was given information about Chronic Care Management services, agreed to services, and gave verbal consent prior to initiation of services.  Please see initial visit note for detailed documentation.   Patient agreed to services and consent obtained.   Assessment: Review of patient past medical history, allergies, medications, and health status, including review of relevant consultants reports was performed today as part of a comprehensive evaluation and provision of chronic care management and care coordination services.     SDOH (Social Determinants of Health) assessments and interventions performed:    Advanced Directives Status: Not addressed in this encounter.  CCM Care Plan  Allergies  Allergen Reactions  . Lamictal [Lamotrigine] Shortness Of Breath, Itching and Swelling    Tongue swells  . Mobic [Meloxicam] Anaphylaxis  . Ultram [Tramadol Hcl] Anaphylaxis  . Codeine Itching  . Diclofenac Itching  . Latex     Latex band-aids cause redness and tears your skin  . Nitrofurantoin     Causing itching all over, tongue swelling, chest heaviness, swollen lips, cough, rapid HR, swollen eyelids, red eyes/gums, lips red/white per pt.  . Teriflunomide Diarrhea    Hair loss, joint pains, elevated liver enzymes abugio (joint pains and liver issues)  . Trazodone And Nefazodone     Mouth sores, tongue swelling  . Tape Rash    Outpatient  Encounter Medications as of 08/05/2020  Medication Sig  . Ascorbic Acid (VITAMIN C) 1000 MG tablet Take 1,000 mg by mouth daily.  [name] Kitchen aspirin EC 81 MG tablet Take 81 mg by mouth daily.  . baclofen (LIORESAL) 10 MG tablet Take 2 tablets (20 mg total) by mouth 3 (three) times daily.  . Cholecalciferol (VITAMIN D) 50 MCG (2000 UT) CAPS Take 2,000 Units by mouth daily.   [name] Kitchen CINNAMON PO Take 1,200 mg by mouth daily.   . diphenhydrAMINE (BENADRYL) 25 MG tablet Take 25 mg by mouth 3 (three) times daily. Takes with Hydrocodone  . doxepin (SINEQUAN) 10 MG capsule TAKE 1 TO 2 CAPSULES AT BEDTIME  . ELDERBERRY PO Take by mouth daily.  [name] Kitchen etodolac (LODINE) 500 MG tablet Take 500 mg by mouth. 1-2 times a day (Patient not taking: Reported on 06/26/2020)  . Evening Primrose Oil 1000 MG CAPS 1300mg  daily  . famotidine (PEPCID) 20 MG tablet Take 1 tablet (20 mg total) by mouth 2 (two) times daily. (Patient taking differently: Take 20 mg by mouth daily.)  . Flaxseed, Linseed, (FLAX SEED OIL) 1000 MG CAPS Take 1,200 mg by mouth daily.   [name] Kitchen gabapentin (NEURONTIN) 300 MG capsule Take 1 capsule (300 mg total) by mouth 3 (three) times daily.  . Grape Seed 50 MG CAPS Take 2 capsules by mouth daily.   [name] Kitchen HYDROcodone-acetaminophen (NORCO) 10-325 MG tablet Take 1/2 tablet by mouth three times daily as needed for moderate pain  . HYDROcodone-acetaminophen (NORCO/VICODIN) 5-325 MG tablet Take 1 tablet by mouth 3 (three) times daily as needed for moderate pain.  [name] Kitchen  ipratropium (ATROVENT) 0.06 % nasal spray Place 2 sprays into both nostrils 2 (two) times daily.  [name] Kitchen L-Lysine 1000 MG TABS Take by mouth. As needed  . Lactobacillus (PROBIOTIC ACIDOPHILUS PO) Take by mouth daily. Digestive Advantage  . levocetirizine (XYZAL) 5 MG tablet TAKE 1 TABLET BY MOUTH EVERY DAY IN THE EVENING  . loratadine-pseudoephedrine (QC LORATADINE-D) 10-240 MG 24 hr tablet Take 1 tablet by mouth daily.  [name] Kitchen LORazepam (ATIVAN) 1 MG tablet Take 1 tablet (1 mg  total) by mouth at bedtime.  . Lutein-Bilberry (BILBERRY PLUS LUTEIN PO) Take by mouth daily.  . Melatonin 10 MG TABS Take 1 tablet by mouth at bedtime.   . metFORMIN (GLUCOPHAGE) 500 MG tablet 1 tab daily with lunch (Patient not taking: Reported on 06/26/2020)  . methylPREDNISolone (MEDROL) 4 MG tablet Take 6 tablets on day 1 and decrease by 1 tablet daily until gone.  . Moringa 500 MG CAPS Take 2 capsules by mouth daily.  . Multiple Vitamins-Minerals (HAIR/SKIN/NAILS/BIOTIN) TABS Take by mouth daily.   [name] Kitchen nystatin-triamcinolone ointment (MYCOLOG) Apply 1 application topically 2 (two) times daily.  . Oxcarbazepine (TRILEPTAL) 300 MG tablet One po qAM and two po qPM  . rizatriptan (MAXALT-MLT) 10 MG disintegrating tablet Take 1 tablet (10 mg total) by mouth as needed for migraine. May repeat in 2 hours if needed.  Max of 2 tablets in 24 hours, and 10 tablets per month  . temazepam (RESTORIL) 15 MG capsule Take 1 capsule (15 mg total) by mouth at bedtime as needed for sleep. (Patient not taking: Reported on 06/26/2020)  . timolol (BETIMOL) 0.5 % ophthalmic solution Place 1 drop into the right eye 2 (two) times daily.  [name] Kitchen tiZANidine (ZANAFLEX) 2 MG tablet Take up to 4 pills a day  . vitamin E 1000 UNIT capsule Take 1,000 Units by mouth daily.   [name] Kitchen zolpidem (AMBIEN) 10 MG tablet Take 10 mg by mouth at bedtime as needed. For sleep   No facility-administered encounter medications on file as of 08/05/2020.    Patient Active Problem List   Diagnosis Date Noted  . At risk for heart disease 04/15/2020  . Other hyperlipidemia with hypertriglyceridemia 04/15/2020  . Prediabetes 01/01/2020  . Vitamin D deficiency 01/01/2020  . Vitamin B 12 deficiency 01/01/2020  . At risk for diabetes mellitus 01/01/2020  . Class 1 obesity with serious comorbidity and body mass index (BMI) of 32.0 to 32.9 in adult 01/01/2020  . 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 Disturbance of smell and taste 11/15/2019  . Depression 03/06/2019  .  Diverticulosis 06/14/2018  . Hepatic steatosis 06/14/2018  . 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 Decreased pedal pulses 06/30/2016  . Pain in both lower extremities 06/30/2016  . Facial pain, atypical 06/08/2016  . Fibroids 02/12/2016  . Chest pain 07/07/2015  . Dizziness and giddiness 07/07/2015  . Leiomyoma of uterus 07/07/2015  . Lower abdominal pain 07/07/2015  . Hip pain 03/03/2015  . 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 Multiple sclerosis ([place]) 06/26/2014  . 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 Other fatigue 06/26/2014  . Dysesthesia 06/26/2014  . Depression with anxiety 06/26/2014  . 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 Insomnia 06/26/2014  . Low serum vitamin D 06/26/2014    Conditions to be addressed/monitored: Depression and MS; Limited social support and Caregiver Stress  Care Plan : Social Work [place]  Updates made by Daneen Schick since 08/05/2020 12:00 AM    Problem: Caregiver Stress     Long-Range Goal: Caregiver Coping Optimized  Start Date: 07/01/2020  Expected End Date: 09/29/2020  This Visit's Progress: On track  Recent Progress: On track  Priority: High  Note:   Current Barriers:  . Limited social support . Caregiver Stress with limited ability to care for spouse . Chronic disease management support and education needs related to Depression and MS    Social Work Clinical Goal(s):  [name] Kitchen Over the next 90 days, patient will work with SW to address concerns related to caregiver stress  Interventions: . 1:1 collaboration with [name], MD regarding development and update of comprehensive plan of care as evidenced by provider attestation and co-signature . Inter-disciplinary care team collaboration (see longitudinal plan of care) . Successful outbound call placed to the patient to assist with care coordination needs . Discussed the patients spouse continues to remain in the home under hospice services . Performed chart review to note patients primary  providers office left a voice message to determine if the patient is interested in a referral for supportive counseling services  . Determined the patient has already spoke with Hospice about these services and plans to initiate a relationship approximately 1 month after her husband passes away- the patient is knowledgeable of this service offered under Hospice and is aware she does not need a referral . Collaboration with the patients primary provider to communicate patients plan to initiate grief therapy when needed in the future . Discussed the patient has been in contact with her health plan and was informed she will remain insured "until the day she dies" - discussed this is a relief as the patient was worried she would lose coverage once her husband passes away since she is a rider under his health plan . Performed chart review to note the patient recently began a new medication "Oxcarbaxepine" prescribed by her neurologist - patient indicated via a mychart message to Dr. Felecia Shelling she has experienced itching since starting this medication . Determined the patient has stopped this medication and has noticed some relief in itching  . Noted upcoming Allergist appointment 08/20/20 at 2:30 pm - patient expresses knowledge of appointment with no transportation barriers identified at this time . Scheduled follow up call to the patient over the next month  Patient Goals/Self-Care Activities Over the next 21 days, patient will:   - Patient will self administer medications as prescribed Patient will attend all scheduled provider appointments Patient will call provider office for new concerns or questions Contact SW as needed prior to next scheduled call Engage with Hospice program to assist with spouse care needs   Follow up Plan: SW will follow up with patient by phone over the next 30 days       Follow Up Plan: SW will follow up with patient by phone over the next month.      Daneen Schick, BSW,  CDP Social Worker, Certified Dementia Practitioner [place] [facility] [place] [facility] [phone]  Total time spent performing care coordination and/or care management activities with the patient by phone or face to face = 24 minutes.

## 2020-08-05 NOTE — Patient Instructions (Signed)
Social Worker Visit Information  Goals we discussed today:  Goals Addressed            This Visit's Progress   . Caregiver coping optimized   On track    Timeframe:  Long-Range Goal Priority:  High Start Date:  2.22.22                           Expected End Date:   5.23.22                    Next planned outreach: 4.26.22  Patient Goals/Self-Care Activities Over the next 21 days, patient will:   - Patient will self administer medications as prescribed Patient will attend all scheduled provider appointments Patient will call provider office for new concerns or questions Contact SW as needed prior to next scheduled call Engage with Hospice program to assist with spouse care needs         Materials Provided: Verbal education about Hospice grief support provided by phone  Follow Up Plan: SW will follow up with patient by phone over the next month   Daneen Schick, BSW, CDP Social Worker, Certified Dementia Practitioner [place] [facility] [place] [facility] [phone]

## 2020-08-06 ENCOUNTER — Other Ambulatory Visit: Payer: Self-pay | Admitting: Neurology

## 2020-08-11 ENCOUNTER — Ambulatory Visit (INDEPENDENT_AMBULATORY_CARE_PROVIDER_SITE_OTHER): Payer: Medicare Other | Admitting: Psychology

## 2020-08-11 DIAGNOSIS — F064 Anxiety disorder due to known physiological condition: Secondary | ICD-10-CM | POA: Diagnosis not present

## 2020-08-20 ENCOUNTER — Ambulatory Visit: Payer: Medicare Other | Admitting: Allergy and Immunology

## 2020-08-25 ENCOUNTER — Telehealth: Payer: Medicare Other

## 2020-08-26 ENCOUNTER — Ambulatory Visit (INDEPENDENT_AMBULATORY_CARE_PROVIDER_SITE_OTHER): Payer: Medicare Other | Admitting: Psychology

## 2020-08-26 DIAGNOSIS — F064 Anxiety disorder due to known physiological condition: Secondary | ICD-10-CM

## 2020-08-27 ENCOUNTER — Ambulatory Visit
Admission: RE | Admit: 2020-08-27 | Discharge: 2020-08-27 | Disposition: A | Discharge status: Home / Self Care | Payer: Medicare Other | Source: Ambulatory Visit | Attending: Obstetrics and Gynecology | Admitting: Obstetrics and Gynecology

## 2020-08-27 ENCOUNTER — Other Ambulatory Visit: Payer: Self-pay

## 2020-08-27 DIAGNOSIS — N6002 Solitary cyst of left breast: Secondary | ICD-10-CM

## 2020-08-29 ENCOUNTER — Telehealth: Payer: Self-pay

## 2020-08-29 NOTE — Chronic Care Management (AMB) (Signed)
Chronic Care Management Pharmacy Assistant   Name: Johnelle Tafolla  MRN: [id number] DOB: 1960/12/23  Reason for Encounter: Disease State/ General adherance   Recent office visits:  None noted  Recent consult visits:  07/25/20-Rajiv Manuella Ghazi (Ophtholmology) 07/18/20- Beatrix Fetters (Optometry) 07/16/20- Arlice Colt, MD(Neurology) 02/23/22Dellis Filbert Bond(Ophtholmology) 06-04-2020 Raynelle Fanning, MD (Ophthalmology)  Hospital visits:  None in previous 6 months  Medications: Outpatient Encounter Medications as of 08/29/2020  Medication Sig  . Ascorbic Acid (VITAMIN C) 1000 MG tablet Take 1,000 mg by mouth daily.  [name] Kitchen aspirin EC 81 MG tablet Take 81 mg by mouth daily.  . baclofen (LIORESAL) 10 MG tablet Take 2 tablets (20 mg total) by mouth 3 (three) times daily.  . Cholecalciferol (VITAMIN D) 50 MCG (2000 UT) CAPS Take 2,000 Units by mouth daily.   [name] Kitchen CINNAMON PO Take 1,200 mg by mouth daily.   . diphenhydrAMINE (BENADRYL) 25 MG tablet Take 25 mg by mouth 3 (three) times daily. Takes with Hydrocodone  . doxepin (SINEQUAN) 10 MG capsule TAKE 1 TO 2 CAPSULES AT BEDTIME  . ELDERBERRY PO Take by mouth daily.  [name] Kitchen etodolac (LODINE) 500 MG tablet Take 500 mg by mouth. 1-2 times a day (Patient not taking: Reported on 06/26/2020)  . Evening Primrose Oil 1000 MG CAPS 1300mg  daily  . famotidine (PEPCID) 20 MG tablet Take 1 tablet (20 mg total) by mouth 2 (two) times daily. (Patient taking differently: Take 20 mg by mouth daily.)  . Flaxseed, Linseed, (FLAX SEED OIL) 1000 MG CAPS Take 1,200 mg by mouth daily.   [name] Kitchen gabapentin (NEURONTIN) 300 MG capsule Take 1 capsule (300 mg total) by mouth 3 (three) times daily.  . Grape Seed 50 MG CAPS Take 2 capsules by mouth daily.   [name] Kitchen HYDROcodone-acetaminophen (NORCO) 10-325 MG tablet Take 1/2 tablet by mouth three times daily as needed for moderate pain  . HYDROcodone-acetaminophen (NORCO/VICODIN) 5-325 MG tablet Take 1 tablet by mouth 3 (three) times daily  as needed for moderate pain.  [name] Kitchen ipratropium (ATROVENT) 0.06 % nasal spray Place 2 sprays into both nostrils 2 (two) times daily.  [name] Kitchen L-Lysine 1000 MG TABS Take by mouth. As needed  . Lactobacillus (PROBIOTIC ACIDOPHILUS PO) Take by mouth daily. Digestive Advantage  . levocetirizine (XYZAL) 5 MG tablet TAKE 1 TABLET BY MOUTH EVERY DAY IN THE EVENING  . loratadine-pseudoephedrine (QC LORATADINE-D) 10-240 MG 24 hr tablet Take 1 tablet by mouth daily.  [name] Kitchen LORazepam (ATIVAN) 1 MG tablet Take 1 tablet (1 mg total) by mouth at bedtime.  . Lutein-Bilberry (BILBERRY PLUS LUTEIN PO) Take by mouth daily.  . Melatonin 10 MG TABS Take 1 tablet by mouth at bedtime.   . metFORMIN (GLUCOPHAGE) 500 MG tablet 1 tab daily with lunch (Patient not taking: Reported on 06/26/2020)  . methylPREDNISolone (MEDROL) 4 MG tablet Take 6 tablets on day 1 and decrease by 1 tablet daily until gone.  . Moringa 500 MG CAPS Take 2 capsules by mouth daily.  . Multiple Vitamins-Minerals (HAIR/SKIN/NAILS/BIOTIN) TABS Take by mouth daily.   [name] Kitchen nystatin-triamcinolone ointment (MYCOLOG) Apply 1 application topically 2 (two) times daily.  . rizatriptan (MAXALT-MLT) 10 MG disintegrating tablet Take 1 tablet (10 mg total) by mouth as needed for migraine. May repeat in 2 hours if needed.  Max of 2 tablets in 24 hours, and 10 tablets per month  . temazepam (RESTORIL) 15 MG capsule Take 1 capsule (15 mg total) by mouth at bedtime as needed for sleep. (Patient not  taking: Reported on 06/26/2020)  . timolol (BETIMOL) 0.5 % ophthalmic solution Place 1 drop into the right eye 2 (two) times daily.  [name] Kitchen tiZANidine (ZANAFLEX) 2 MG tablet Take up to 4 pills a day  . vitamin E 1000 UNIT capsule Take 1,000 Units by mouth daily.   [name] Kitchen zolpidem (AMBIEN) 10 MG tablet Take 10 mg by mouth at bedtime as needed. For sleep   No facility-administered encounter medications on file as of 08/29/2020.    Have you had any problems recently with your health? Patient states  her feet and ankles have been swollen for the past week. She states her husband recently passed away and she has been having to do a lot of things that she is not used to doing.  Have you had any problems with your pharmacy? Patient is not having any issues or problems with her pharmacy.  What issues or side effects are you having with your medications? Patient states there was a medication that her primary care physician switched her to which caused a reaction. She states she informed them of this and was switched to something new. Patient could not recall the name of medication but stated it should be on her allergy list.  What would you like me to pass along to [facility] for them to help you with?  Patient states, with her husband passing, her insurance may change and she may need to switch some of her medications depending on coverage.  What can we do to take care of you better? Patient states there is nothing at this time.   Star Rating Drugs: Metformin 500 mg last filled 05/11/20 30DS   [facility] Clinical Pharmacist Assistant [phone]

## 2020-09-02 ENCOUNTER — Telehealth: Payer: Medicare Other

## 2020-09-08 ENCOUNTER — Ambulatory Visit: Payer: Medicare Other | Admitting: Psychology

## 2020-09-12 ENCOUNTER — Telehealth: Payer: Self-pay

## 2020-09-12 ENCOUNTER — Telehealth: Payer: Medicare Other

## 2020-09-12 NOTE — Telephone Encounter (Signed)
  Chronic Care Management   Outreach Note  09/12/2020 Name: Grace [name] MRN: [id number] DOB: Apr 10, 1961  Referred by: [name], MD Reason for referral : Chronic Care Management (Unsuccessful call)   An unsuccessful telephone outreach was attempted today. The patient was referred to the case management team for assistance with care management and care coordination.   Follow Up Plan: A HIPAA compliant phone message was left for the patient providing contact information and requesting a return call.  The care management team will reach out to the patient again over the next 21 days.   Daneen Schick, BSW, CDP Social Worker, Certified Dementia Practitioner [place] [facility] [place] [facility] [phone]

## 2020-09-15 ENCOUNTER — Telehealth: Payer: Self-pay

## 2020-09-15 NOTE — Chronic Care Management (AMB) (Signed)
Chronic Care Management Pharmacy Assistant   Name: Grace [name]  MRN: [id number] DOB: 25-Jun-1960   Reason for Encounter: Disease State/ Diabetes    Recent office visits:  None  Recent consult visits:  None  Hospital visits:  None in previous 6 months  Medications: Outpatient Encounter Medications as of 09/15/2020  Medication Sig  . Ascorbic Acid (VITAMIN C) 1000 MG tablet Take 1,000 mg by mouth daily.  [name] Kitchen aspirin EC 81 MG tablet Take 81 mg by mouth daily.  . baclofen (LIORESAL) 10 MG tablet Take 2 tablets (20 mg total) by mouth 3 (three) times daily.  . Cholecalciferol (VITAMIN D) 50 MCG (2000 UT) CAPS Take 2,000 Units by mouth daily.   [name] Kitchen CINNAMON PO Take 1,200 mg by mouth daily.   . diphenhydrAMINE (BENADRYL) 25 MG tablet Take 25 mg by mouth 3 (three) times daily. Takes with Hydrocodone  . doxepin (SINEQUAN) 10 MG capsule TAKE 1 TO 2 CAPSULES AT BEDTIME  . ELDERBERRY PO Take by mouth daily.  [name] Kitchen etodolac (LODINE) 500 MG tablet Take 500 mg by mouth. 1-2 times a day (Patient not taking: Reported on 06/26/2020)  . Evening Primrose Oil 1000 MG CAPS 1300mg  daily  . famotidine (PEPCID) 20 MG tablet Take 1 tablet (20 mg total) by mouth 2 (two) times daily. (Patient taking differently: Take 20 mg by mouth daily.)  . Flaxseed, Linseed, (FLAX SEED OIL) 1000 MG CAPS Take 1,200 mg by mouth daily.   [name] Kitchen gabapentin (NEURONTIN) 300 MG capsule Take 1 capsule (300 mg total) by mouth 3 (three) times daily.  . Grape Seed 50 MG CAPS Take 2 capsules by mouth daily.   [name] Kitchen HYDROcodone-acetaminophen (NORCO) 10-325 MG tablet Take 1/2 tablet by mouth three times daily as needed for moderate pain  . HYDROcodone-acetaminophen (NORCO/VICODIN) 5-325 MG tablet Take 1 tablet by mouth 3 (three) times daily as needed for moderate pain.  [name] Kitchen ipratropium (ATROVENT) 0.06 % nasal spray Place 2 sprays into both nostrils 2 (two) times daily.  [name] Kitchen L-Lysine 1000 MG TABS Take by mouth. As needed  . Lactobacillus  (PROBIOTIC ACIDOPHILUS PO) Take by mouth daily. Digestive Advantage  . levocetirizine (XYZAL) 5 MG tablet TAKE 1 TABLET BY MOUTH EVERY DAY IN THE EVENING  . loratadine-pseudoephedrine (QC LORATADINE-D) 10-240 MG 24 hr tablet Take 1 tablet by mouth daily.  [name] Kitchen LORazepam (ATIVAN) 1 MG tablet Take 1 tablet (1 mg total) by mouth at bedtime.  . Lutein-Bilberry (BILBERRY PLUS LUTEIN PO) Take by mouth daily.  . Melatonin 10 MG TABS Take 1 tablet by mouth at bedtime.   . metFORMIN (GLUCOPHAGE) 500 MG tablet 1 tab daily with lunch (Patient not taking: Reported on 06/26/2020)  . methylPREDNISolone (MEDROL) 4 MG tablet Take 6 tablets on day 1 and decrease by 1 tablet daily until gone.  . Moringa 500 MG CAPS Take 2 capsules by mouth daily.  . Multiple Vitamins-Minerals (HAIR/SKIN/NAILS/BIOTIN) TABS Take by mouth daily.   [name] Kitchen nystatin-triamcinolone ointment (MYCOLOG) Apply 1 application topically 2 (two) times daily.  . rizatriptan (MAXALT-MLT) 10 MG disintegrating tablet Take 1 tablet (10 mg total) by mouth as needed for migraine. May repeat in 2 hours if needed.  Max of 2 tablets in 24 hours, and 10 tablets per month  . temazepam (RESTORIL) 15 MG capsule Take 1 capsule (15 mg total) by mouth at bedtime as needed for sleep. (Patient not taking: Reported on 06/26/2020)  . timolol (BETIMOL) 0.5 % ophthalmic solution Place 1 drop into the right  eye 2 (two) times daily.  [name] Kitchen tiZANidine (ZANAFLEX) 2 MG tablet Take up to 4 pills a day  . vitamin E 1000 UNIT capsule Take 1,000 Units by mouth daily.   [name] Kitchen zolpidem (AMBIEN) 10 MG tablet Take 10 mg by mouth at bedtime as needed. For sleep   No facility-administered encounter medications on file as of 09/15/2020.   Recent Relevant Labs: Lab Results  Component Value Date/Time   HGBA1C 5.6 04/16/2020 04:35 PM   HGBA1C 5.9 (H) 11/22/2019 05:10 PM    Kidney Function Lab Results  Component Value Date/Time   CREATININE 0.71 04/16/2020 04:35 PM   CREATININE 0.66 10/10/2019  03:04 PM   GFRNONAA 96 04/16/2020 04:35 PM   GFRAA 110 04/16/2020 04:35 PM    . Current antihyperglycemic regimen:   Metformin 500 mg daily (patient reported not taking)  . What recent interventions/DTPs have been made to improve glycemic control:  o Patient states her last visit with Dr.Sanders labs were normal.  . Have there been any recent hospitalizations or ED visits since last visit with CPP? No   . Patient denies hypoglycemic symptoms  . Patient denies hyperglycemic symptoms  . How often are you checking your blood sugar? Patient states she doesn't check blood sugars anymore  . What are your blood sugars ranging?  o Fasting: None o Before meals: None o After meals: None o Bedtime: None  . During the week, how often does your blood glucose drop below 70? Patient states to her knowledge her blood sugar never drops below 70.  . Are you checking your feet daily/regularly? Patient states she checks feet everyday after washing.   Adherence Review: Is the patient currently on a STATIN medication? No Is the patient currently on ACE/ARB medication? No Does the patient have >5 day gap between last estimated fill dates? No  NOTES: Patient states she was diagnosed with pre diabetes but recent A1c was 5.6 so she doesn't check her blood sugar. Patient states her husband passed last month so she is still grieving and trying to get all finances situated. Patient states she hasn't been eating healthy lately because of her husband's death. Patient wants a call back next month to possibly schedule a visit with [name] but doesn't want to schedule now due to change of insurance that may take place.   Star Rating Drugs: Metformin 500 mg- last filled 05-11-2020 30DS [facility]  [place]  [phone]

## 2020-09-18 ENCOUNTER — Telehealth: Payer: Medicare Other

## 2020-09-22 ENCOUNTER — Other Ambulatory Visit: Payer: Self-pay | Admitting: Internal Medicine

## 2020-09-22 ENCOUNTER — Telehealth: Payer: Self-pay | Admitting: *Deleted

## 2020-09-22 ENCOUNTER — Other Ambulatory Visit: Payer: Self-pay

## 2020-09-22 MED ORDER — HYDROCODONE-ACETAMINOPHEN 5-325 MG PO TABS: 1.0000 | ORAL_TABLET | Freq: Three times a day (TID) | ORAL | 0 refills | Status: DC | PRN

## 2020-09-22 NOTE — Telephone Encounter (Signed)
Received fax from [facility] that PA approved 09/22/20-03/21/21.

## 2020-09-22 NOTE — Telephone Encounter (Signed)
Submitted PA Hydrocodone on CMM. Key: [id number] - PA Case ID: [id number] - Rx #: [id number]. Waiting on determination from [place].

## 2020-09-25 ENCOUNTER — Encounter: Payer: Self-pay | Admitting: Allergy and Immunology

## 2020-09-25 ENCOUNTER — Other Ambulatory Visit: Payer: Self-pay

## 2020-09-25 ENCOUNTER — Ambulatory Visit (INDEPENDENT_AMBULATORY_CARE_PROVIDER_SITE_OTHER): Payer: Medicare Other | Admitting: Allergy and Immunology

## 2020-09-25 VITALS — BP 118/94 | HR 92 | Resp 18 | Ht 66.0 in | Wt 216.0 lb

## 2020-09-25 DIAGNOSIS — U099 Post covid-19 condition, unspecified: Secondary | ICD-10-CM

## 2020-09-25 DIAGNOSIS — J3089 Other allergic rhinitis: Secondary | ICD-10-CM | POA: Diagnosis not present

## 2020-09-25 DIAGNOSIS — T7800XA Anaphylactic reaction due to unspecified food, initial encounter: Secondary | ICD-10-CM

## 2020-09-25 DIAGNOSIS — U071 COVID-19: Secondary | ICD-10-CM

## 2020-09-25 DIAGNOSIS — T50B95D Adverse effect of other viral vaccines, subsequent encounter: Secondary | ICD-10-CM

## 2020-09-25 MED ORDER — EPINEPHRINE 0.3 MG/0.3ML IJ SOAJ: INTRAMUSCULAR | 3 refills | Status: DC

## 2020-09-25 NOTE — Patient Instructions (Addendum)
  1.  Blood -semiquantitative anti-RBD COVID IgG antibody, area two aero allergen profile, alpha gal panel, fish panel, banana IgE  2.  COVID-vaccine component testing???  3.  Monoclonal anti-RBD infusion for COVID infection???  4.  Continue Allegra-D, Xyzal, nasal ipratropium  5.  EpiPen, Benadryl, MD/ER evaluation for allergic reaction

## 2020-09-25 NOTE — Progress Notes (Signed)
[facility]   Dear Grace [name],  Thank you for referring Grace [name] to the [facility] on 09/25/2020.   Below is a summation of this patient's evaluation and recommendations.  Thank you for your referral. I will keep you informed about this patient's response to treatment.   If you have any questions please do not hesitate to contact me.   Sincerely,  Jiles Prows, MD Allergy / Immunology [facility]   ______________________________________________________________________    NEW PATIENT NOTE  Referring Provider: Minette Brine, FNP Primary Provider: [name], MD Date of office visit: 09/25/2020    Subjective:   Chief Complaint:  Grace [name] (DOB: 1960/10/12) is a 60 y.o. female who presents to the clinic on 09/25/2020 with a chief complaint of No chief complaint on file. [name] Kitchen     HPI: [name] presents to this clinic in evaluation of several issues.  First, she apparently had a Flu vaccine reaction and she is very leery about receiving the COVID-vaccine. Her vaccine reaction was manifested as throat closing, tongue swelling, and diffuse itching that appeared to occur within 24 hours of receiving her vaccine.She does have multiple sclerosis that appears to be intermittently active requiring the administration of high-dose systemic steroids multiple times per year.  She is not on a consistent immunosuppressive drug at this point in time as she has had some reactions to Betaseron, Copaxone, and Aubagio in the past.    Second, she may have had a COVID infection sometime in January 2022 at which point in time she developed an upper respiratory tract infection with a low-grade fever and nasal congestion and sniffing and snorting and sneezing and clear rhinorrhea and a cough that lasted 1 week.  She was never tested for COVID  at that point.  Third, she has a history of spring and fall hayfever manifested as nasal congestion and sneezing for which she takes Allegra-D in the morning and Xyzal at night and occasionally use nasal ipratropium.  Overall she thinks she is doing relatively well regarding that issue while on this plan.  She has used a nasal steroid in the past but it did not really help this issue.  Fourth, she apparently has a possible food allergy.  When she eats pork or beef she gets very significant GI upset and sometimes heartburn.  This also appears to occur with bananas and tuna consumption.  She has no other associated systemic or constitutional symptoms.  Past Medical History:  Diagnosis Date  . Allergy   . Anxiety   . Avascular necrosis ([place])   . Back pain   . Cancer ([place])   . Chest pain   . Chewing difficulty   . Chronic fatigue syndrome   . Colon polyp    pre-cancerous  . Complication of anesthesia    patient states she requires more anesthesia  . Depression   . Disorder of soft tissue   . Diverticulosis   . Dizziness   . Edema, lower extremity   . Fatty liver   . Fibromyalgia   . GERD (gastroesophageal reflux disease)   . H/O breast biopsy    pre- cancerous cells  . Hand fracture, left    9/87  . Heart palpitations   . Heart valve disease   . History of Holter monitoring   . Hyperlipidemia   . Insomnia   .  Joint pain   . Lactose intolerance   . Lhermitte's syndrome   . Migraines   . Multiple food allergies   . Multiple sclerosis ([place])    dx in 11/1996  . Multiple sclerosis ([place])   . Neuromuscular disorder ([place])   . Neuropathy   . Numbness and tingling in hands   . Obesity   . Optic neuritis   . OSA (obstructive sleep apnea)    no longer have OSA since stopped taking a MS medication  . Osteoarthritis   . Otitis media   . Palpitations   . Prediabetes   . Raynaud's phenomenon   . Shortness of breath   . Sleep apnea    Due to medication that she no longer takes.   . Swallowing difficulty   . Vaginitis and vulvovaginitis   . Vitamin B12 deficiency   . Vitamin D deficiency     Past Surgical History:  Procedure Laterality Date  . ABDOMINAL HYSTERECTOMY Bilateral 02/12/2016   Procedure: HYSTERECTOMY ABDOMINAL WITH BILATERAL SALPINGO OOPHERECTOMY;  Surgeon: Dian Queen, MD;  Location: [facility];  Service: Gynecology;  Laterality: Bilateral;  . BREAST EXCISIONAL BIOPSY     core/left breast  . CATARACT EXTRACTION, BILATERAL  06/2019  . CHOLECYSTECTOMY  1983  . COLONOSCOPY     1997/2001  . DILATION AND CURETTAGE OF UTERUS    . ENDOMETRIAL ABLATION  2012  . LAPAROSCOPIC ABDOMINAL EXPLORATION     ovaries and intestines bond together  . LEFT HEART CATHETERIZATION WITH CORONARY ANGIOGRAM N/A 06/21/2011   Procedure: LEFT HEART CATHETERIZATION WITH CORONARY ANGIOGRAM;  Surgeon: Leonie Man, MD;  Location: [facility] CATH LAB;  Service: Cardiovascular;  Laterality: N/A;  . LUMBAR PUNCTURE     11/01/1996  . SALPINGOOPHORECTOMY Bilateral 02/12/2016   Procedure: BILATERAL SALPINGO OOPHORECTOMY;  Surgeon: Dian Queen, MD;  Location: [facility];  Service: Gynecology;  Laterality: Bilateral;  . TOTAL HIP ARTHROPLASTY     left hip  6/02  /right hip12/04    Allergies as of 09/25/2020      Reactions   Lamictal [lamotrigine] Shortness Of Breath, Itching, Swelling   Tongue swells   Mobic [meloxicam] Anaphylaxis   Ultram [tramadol Hcl] Anaphylaxis   Nefazodone Itching, Swelling   Other reaction(s): Arthralgia (Joint Pain); Throat swelling, mouth sores   Betaseron [interferon Beta-1b] Other (See Comments)   Caused avascular necrosis   Codeine Itching   Copaxone [glatiramer Acetate] Other (See Comments)   Altered mental status: mental changes, anxiety, motion sickness   Diclofenac Itching   Gabapentin Itching   Itching if she takes more than 300mg    Latex    Latex band-aids cause redness and tears your skin   Nitrofurantoin    Causing itching all over, tongue  swelling, chest heaviness, swollen lips, cough, rapid HR, swollen eyelids, red eyes/gums, lips red/white per pt.   Oxcarbazepine Itching, Other (See Comments)   Dizziness, throat hurts   Teriflunomide Diarrhea   Hair loss, joint pains, elevated liver enzymes abugio (joint pains and liver issues)   Trazodone And Nefazodone    Mouth sores, tongue swelling   Tape Rash      Medication List      aspirin EC 81 MG tablet Take 81 mg by mouth daily.   baclofen 10 MG tablet Commonly known as: LIORESAL Take 2 tablets (20 mg total) by mouth 3 (three) times daily.   BILBERRY PLUS LUTEIN PO Take by mouth daily.   CINNAMON PO Take 1,200 mg by mouth daily.  diphenhydrAMINE 25 MG tablet Commonly known as: BENADRYL Take 25 mg by mouth 3 (three) times daily. Takes with Hydrocodone   doxepin 10 MG capsule Commonly known as: SINEQUAN TAKE 1 TO 2 CAPSULES AT BEDTIME   ELDERBERRY PO Take by mouth daily.   etodolac 500 MG tablet Commonly known as: LODINE Take 500 mg by mouth. 1-2 times a day   Evening Primrose Oil 1000 MG Caps 1300mg  daily   famotidine 20 MG tablet Commonly known as: PEPCID Take 1 tablet (20 mg total) by mouth 2 (two) times daily.   Flax Seed Oil 1000 MG Caps Take 1,200 mg by mouth daily.   gabapentin 300 MG capsule Commonly known as: NEURONTIN Take 1 capsule (300 mg total) by mouth 3 (three) times daily.   Grape Seed 50 MG Caps Take 2 capsules by mouth daily.   Hair/Skin/Nails/Biotin Tabs Take by mouth daily.   HYDROcodone-acetaminophen 10-325 MG tablet Commonly known as: NORCO Take 1/2 tablet by mouth three times daily as needed for moderate pain   HYDROcodone-acetaminophen 5-325 MG tablet Commonly known as: NORCO/VICODIN Take 1 tablet by mouth 3 (three) times daily as needed for moderate pain.   ipratropium 0.06 % nasal spray Commonly known as: ATROVENT Place 2 sprays into both nostrils 2 (two) times daily.   L-Lysine 1000 MG Tabs Take by mouth.  As needed   levocetirizine 5 MG tablet Commonly known as: XYZAL TAKE 1 TABLET BY MOUTH EVERY DAY IN THE EVENING   LORazepam 1 MG tablet Commonly known as: Ativan Take 1 tablet (1 mg total) by mouth at bedtime.   Melatonin 10 MG Tabs Take 1 tablet by mouth at bedtime.   metFORMIN 500 MG tablet Commonly known as: GLUCOPHAGE 1 tab daily with lunch   Moringa 500 MG Caps Take 2 capsules by mouth daily.   nystatin-triamcinolone ointment Commonly known as: MYCOLOG Apply 1 application topically 2 (two) times daily.   PROBIOTIC ACIDOPHILUS PO Take by mouth daily. Digestive Advantage   QC Loratadine-D 10-240 MG 24 hr tablet Generic drug: loratadine-pseudoephedrine Take 1 tablet by mouth daily.   rizatriptan 10 MG disintegrating tablet Commonly known as: MAXALT-MLT Take 1 tablet (10 mg total) by mouth as needed for migraine. May repeat in 2 hours if needed.  Max of 2 tablets in 24 hours, and 10 tablets per month   temazepam 15 MG capsule Commonly known as: RESTORIL Take 1 capsule (15 mg total) by mouth at bedtime as needed for sleep.   timolol 0.5 % ophthalmic solution Commonly known as: BETIMOL Place 1 drop into the right eye 2 (two) times daily.   tiZANidine 2 MG tablet Commonly known as: ZANAFLEX Take up to 4 pills a day   vitamin C 1000 MG tablet Take 1,000 mg by mouth daily.   Vitamin D 50 MCG (2000 UT) Caps Take 2,000 Units by mouth daily.   vitamin E 1000 UNIT capsule Take 1,000 Units by mouth daily.   zolpidem 10 MG tablet Commonly known as: AMBIEN Take 10 mg by mouth at bedtime as needed. For sleep       Review of systems negative except as noted in HPI / PMHx or noted below:  Review of Systems  Constitutional: Negative.   HENT: Negative.   Eyes: Negative.   Respiratory: Negative.   Cardiovascular: Negative.   Gastrointestinal: Negative.   Genitourinary: Negative.   Musculoskeletal: Negative.   Skin: Negative.   Neurological: Negative.    Endo/Heme/Allergies: Negative.   Psychiatric/Behavioral: Negative.  Family History  Problem Relation Age of Onset  . Pancreatic cancer Mother   . Stroke Mother   . Hypertension Mother   . Hyperlipidemia Mother   . Heart disease Mother   . Cancer Mother   . Depression Mother   . Anxiety disorder Mother   . Alcoholism Mother   . Testicular cancer Brother   . Colon cancer Maternal Uncle   . Pancreatic cancer Paternal Uncle   . Prostate cancer Maternal Grandfather   . Colon polyps Father   . Diabetes Father   . Hypertension Father   . Hyperlipidemia Father   . Kidney disease Father   . Sleep apnea Father   . Obesity Father     Social History   Socioeconomic History  . Marital status: Married    Spouse name: Not on file  . Number of children: Not on file  . Years of education: Not on file  . Highest education level: Not on file  Occupational History  . Occupation: disabled  Tobacco Use  . Smoking status: Former Smoker    Packs/day: 0.25    Years: 8.00    Pack years: 2.00    Types: Cigarettes    Quit date: 07/01/1995    Years since quitting: 25.2  . Smokeless tobacco: Never Used  Vaping Use  . Vaping Use: Never used  Substance and Sexual Activity  . Alcohol use: No    Alcohol/week: 0.0 standard drinks  . Drug use: Yes    Types: Hydrocodone  . Sexual activity: Not Currently  Other Topics Concern  . Not on file  Social History Narrative  . Not on file   Environmental and Social history  Lives in a house with a dry environment, 2 dogs inside the household, no carpet in the bedroom, plastic on the bed, plastic on the pillow, and no smoking ongoing with inside the household.There is a woodstove inside the household.  Objective:   Vitals:   09/25/20 1427 09/25/20 1525  BP: (!) 138/96 (!) 118/94  Pulse: 92   Resp: 18   SpO2: 94%    Height: 5\' 6"  (167.6 cm) Weight: 216 lb (98 kg)  Physical Exam Constitutional:      Appearance: She is not  diaphoretic.  HENT:     Head: Normocephalic.     Right Ear: Tympanic membrane, ear canal and external ear normal.     Left Ear: Tympanic membrane, ear canal and external ear normal.     Nose: Nose normal. No mucosal edema or rhinorrhea.     Mouth/Throat:     Pharynx: Uvula midline. No oropharyngeal exudate.  Eyes:     Conjunctiva/sclera: Conjunctivae normal.  Neck:     Thyroid: No thyromegaly.     Trachea: Trachea normal. No tracheal tenderness or tracheal deviation.  Cardiovascular:     Rate and Rhythm: Normal rate and regular rhythm.     Heart sounds: Normal heart sounds, S1 normal and S2 normal. No murmur heard.   Pulmonary:     Effort: No respiratory distress.     Breath sounds: Normal breath sounds. No stridor. No wheezing or rales.  Lymphadenopathy:     Head:     Right side of head: No tonsillar adenopathy.     Left side of head: No tonsillar adenopathy.     Cervical: No cervical adenopathy.  Skin:    Findings: No erythema or rash.     Nails: There is no clubbing.  Neurological:     Mental  Status: She is alert.     Diagnostics: Allergy skin tests were not performed secondary to the recent use of doxepin.  Assessment and Plan:    1. Allergy with anaphylaxis due to food   2. Adverse effect of influenza vaccine, subsequent encounter   3. Perennial allergic rhinitis   4. COVID-19 virus infection     1.  Blood -semiquantitative anti-RBD COVID IgG antibody, area two aero allergen profile, alpha gal panel, fish panel, banana IgE  2.  COVID-vaccine component testing???  3.  Monoclonal anti-RBD infusion for COVID infection???  4.  Continue Allegra-D, Xyzal, nasal ipratropium  5.  EpiPen, Benadryl, MD/ER evaluation for allergic reaction  In a ideal world it would be best if Dionne Milo was immunized with 4 doses of COVID-vaccine but she has a lot of hesitation about receiving this vaccine based upon her previous flu vaccine reaction.  We will work through this issue by  checking her anti-RBD COVID IgG antibody titer and if it is very low then we will need to work through her potential COVID-vaccine reaction by having her undergo COVID-vaccine component testing prior to receiving a COVID-vaccine.  As well, we will evaluate her other atopic disease including possible food allergy with the blood test noted above.  I will contact her once the results of her blood tests are available for review.  Jiles Prows, MD Allergy / Immunology [facility]

## 2020-09-29 ENCOUNTER — Encounter: Payer: Self-pay | Admitting: Allergy and Immunology

## 2020-10-03 ENCOUNTER — Telehealth: Payer: Medicare Other

## 2020-10-03 ENCOUNTER — Telehealth: Payer: Self-pay | Admitting: Allergy and Immunology

## 2020-10-03 LAB — ALLERGEN PROFILE, FOOD-FISH
Allergen Mackerel IgE: <0.1 kU/L
Allergen Salmon IgE: <0.1 kU/L
Allergen Trout IgE: <0.1 kU/L
Allergen Walley Pike IgE: <0.1 kU/L
Codfish IgE: <0.1 kU/L
Halibut IgE: <0.1 kU/L
Tuna: <0.1 kU/L

## 2020-10-03 LAB — ALLERGENS W/TOTAL IGE AREA 2
Alternaria Alternata IgE: <0.1 kU/L
Aspergillus Fumigatus IgE: 0.22 kU/L — AB
Bermuda Grass IgE: <0.1 kU/L
Cat Dander IgE: 0.18 kU/L — AB
Cedar, Mountain IgE: <0.1 kU/L
Cladosporium Herbarum IgE: <0.1 kU/L
Cockroach, German IgE: <0.1 kU/L
Common Silver Birch IgE: <0.1 kU/L
Cottonwood IgE: <0.1 kU/L
D Farinae IgE: <0.1 kU/L
D Pteronyssinus IgE: <0.1 kU/L
Dog Dander IgE: 0.2 kU/L — AB
Elm, American IgE: <0.1 kU/L
Johnson Grass IgE: <0.1 kU/L
Maple/Box Elder IgE: <0.1 kU/L
Mouse Urine IgE: <0.1 kU/L
Oak, White IgE: <0.1 kU/L
Pecan, Hickory IgE: <0.1 kU/L
Penicillium Chrysogen IgE: 0.18 kU/L — AB
Pigweed, Rough IgE: <0.1 kU/L
Ragweed, Short IgE: <0.1 kU/L
Sheep Sorrel IgE Qn: <0.1 kU/L
Timothy Grass IgE: <0.1 kU/L
White Mulberry IgE: <0.1 kU/L

## 2020-10-03 LAB — SAR COV2 SEROLOGY (COVID19)AB(IGG),IA
SARS-CoV-2 Semi-Quant IgG Ab: 28.9 AU/mL (ref <13.0)
SARS-CoV-2 Spike Ab Interp: POSITIVE

## 2020-10-03 LAB — ALPHA-GAL PANEL
Allergen Lamb IgE: 0.38 kU/L — AB
Beef IgE: 1.28 kU/L — AB
IgE (Immunoglobulin E), Serum: 48 IU/mL (ref 6–495)
O215-IgE Alpha-Gal: 1.96 kU/L — AB
Pork IgE: 0.41 kU/L — AB

## 2020-10-03 LAB — ALLERGEN BANANA: Allergen Banana IgE: 0.12 kU/L — AB

## 2020-10-03 NOTE — Telephone Encounter (Signed)
Patient was returning a call from yesterday, about her lab results.

## 2020-10-03 NOTE — Telephone Encounter (Signed)
Discussed results with patient

## 2020-10-09 ENCOUNTER — Other Ambulatory Visit: Payer: Self-pay | Admitting: Internal Medicine

## 2020-10-09 ENCOUNTER — Other Ambulatory Visit: Payer: Self-pay | Admitting: Neurology

## 2020-10-12 ENCOUNTER — Other Ambulatory Visit: Payer: Self-pay | Admitting: Neurology

## 2020-10-15 ENCOUNTER — Ambulatory Visit: Payer: Medicare Other

## 2020-10-15 ENCOUNTER — Ambulatory Visit: Payer: Medicare Other | Admitting: Internal Medicine

## 2020-10-15 ENCOUNTER — Ambulatory Visit (INDEPENDENT_AMBULATORY_CARE_PROVIDER_SITE_OTHER): Payer: Medicare Other

## 2020-10-15 DIAGNOSIS — G35 Multiple sclerosis: Secondary | ICD-10-CM

## 2020-10-15 DIAGNOSIS — F3342 Major depressive disorder, recurrent, in full remission: Secondary | ICD-10-CM

## 2020-10-15 DIAGNOSIS — G35D Multiple sclerosis, unspecified: Secondary | ICD-10-CM

## 2020-10-15 DIAGNOSIS — E6609 Other obesity due to excess calories: Secondary | ICD-10-CM

## 2020-10-15 NOTE — Chronic Care Management (AMB) (Signed)
Chronic Care Management    Social Work Note  10/15/2020 Name: Grace [name] MRN: [id number] DOB: 27-May-1960  Grace [name] is a 60 y.o. year old female who is a primary care patient of [name], MD. The CCM team was consulted to assist the patient with chronic disease management and/or care coordination needs related to: Caregiver Stress and Grief.   Engaged with patient by telephone for follow up visit in response to provider referral for social work chronic care management and care coordination services.   Consent to Services:  The patient was given information about Chronic Care Management services, agreed to services, and gave verbal consent prior to initiation of services.  Please see initial visit note for detailed documentation.   Patient agreed to services and consent obtained.   Assessment: Review of patient past medical history, allergies, medications, and health status, including review of relevant consultants reports was performed today as part of a comprehensive evaluation and provision of chronic care management and care coordination services.     SDOH (Social Determinants of Health) assessments and interventions performed:    Advanced Directives Status: Not addressed in this encounter.  CCM Care Plan  Allergies  Allergen Reactions  . Lamictal [Lamotrigine] Shortness Of Breath, Itching and Swelling    Tongue swells  . Mobic [Meloxicam] Anaphylaxis  . Ultram [Tramadol Hcl] Anaphylaxis  . Nefazodone Itching and Swelling    Other reaction(s): Arthralgia (Joint Pain); Throat swelling, mouth sores  . Betaseron [Interferon Beta-1b] Other (See Comments)    Caused avascular necrosis  . Codeine Itching  . Copaxone [Glatiramer Acetate] Other (See Comments)    Altered mental status: mental changes, anxiety, motion sickness  . Diclofenac Itching  . Gabapentin Itching    Itching if she takes more than 300mg   . Latex     Latex band-aids cause redness and  tears your skin  . Nitrofurantoin     Causing itching all over, tongue swelling, chest heaviness, swollen lips, cough, rapid HR, swollen eyelids, red eyes/gums, lips red/white per pt.  . Oxcarbazepine Itching and Other (See Comments)    Dizziness, throat hurts  . Teriflunomide Diarrhea    Hair loss, joint pains, elevated liver enzymes abugio (joint pains and liver issues)  . Trazodone And Nefazodone     Mouth sores, tongue swelling  . Tape Rash    Outpatient Encounter Medications as of 10/15/2020  Medication Sig  . Ascorbic Acid (VITAMIN C) 1000 MG tablet Take 1,000 mg by mouth daily.  [name] Kitchen aspirin EC 81 MG tablet Take 81 mg by mouth daily.  . baclofen (LIORESAL) 10 MG tablet Take 2 tablets (20 mg total) by mouth 3 (three) times daily.  [name] Kitchen BLACK ELDERBERRY PO Take 300 mg by mouth in the morning and at bedtime.  . Cholecalciferol (VITAMIN D) 50 MCG (2000 UT) CAPS Take 2,000 Units by mouth daily.   [name] Kitchen CINNAMON PO Take 1,200 mg by mouth daily.   . diphenhydrAMINE (BENADRYL) 25 MG tablet Take 25 mg by mouth 3 (three) times daily. Takes with Hydrocodone  . doxepin (SINEQUAN) 10 MG capsule TAKE 1 TO 2 CAPSULES AT BEDTIME  . EPINEPHrine 0.3 mg/0.3 mL IJ SOAJ injection Use as directed for life-threatening allergic reaction.  [name] Kitchen etodolac (LODINE) 500 MG tablet Take 500 mg by mouth. 1-2 times a day  . famotidine (PEPCID) 20 MG tablet TAKE 1 TABLET BY MOUTH TWICE A DAY  . Fexofenadine-Pseudoephedrine (ALLEGRA-D PO) Take by mouth in the morning.  . Flaxseed, Linseed, (FLAX  SEED OIL) 1000 MG CAPS Take 1,200 mg by mouth daily.   [name] Kitchen gabapentin (NEURONTIN) 300 MG capsule Take 1 capsule (300 mg total) by mouth 3 (three) times daily.  [name] Kitchen HYDROcodone-acetaminophen (NORCO/VICODIN) 5-325 MG tablet Take 1 tablet by mouth 3 (three) times daily as needed for moderate pain.  [name] Kitchen ipratropium (ATROVENT) 0.06 % nasal spray Place 2 sprays into both nostrils 2 (two) times daily.  [name] Kitchen L-Lysine 1000 MG TABS Take by mouth. As needed   . Lactobacillus (PROBIOTIC ACIDOPHILUS PO) Take by mouth daily. Digestive Advantage BC   bacillus coagulans  . levocetirizine (XYZAL) 5 MG tablet TAKE 1 TABLET BY MOUTH EVERY DAY IN THE EVENING  . LORazepam (ATIVAN) 1 MG tablet Take 1 tablet (1 mg total) by mouth at bedtime.  . Lutein-Bilberry (BILBERRY PLUS LUTEIN PO) Take by mouth daily.  . Melatonin 10 MG TABS Take 1 tablet by mouth at bedtime.   . Moringa 500 MG CAPS Take 2 capsules by mouth daily.  . Multiple Vitamins-Minerals (HAIR/SKIN/NAILS/BIOTIN) TABS Take by mouth daily.   . Nutritional Supplements (NUTRITIONAL SUPPLEMENT PO) Take 2.1 g by mouth in the morning and at bedtime. LION'S MANE  . nystatin-triamcinolone ointment (MYCOLOG) Apply 1 application topically 2 (two) times daily.  . rizatriptan (MAXALT-MLT) 10 MG disintegrating tablet Take 1 tablet (10 mg total) by mouth as needed for migraine. May repeat in 2 hours if needed.  Max of 2 tablets in 24 hours, and 10 tablets per month  . temazepam (RESTORIL) 15 MG capsule Take 1 capsule (15 mg total) by mouth at bedtime as needed for sleep.  [name] Kitchen timolol (BETIMOL) 0.5 % ophthalmic solution Place 1 drop into the right eye 2 (two) times daily.  [name] Kitchen tiZANidine (ZANAFLEX) 2 MG tablet TAKE UP TO 4 PILLS A DAY  . Vitamin D, Ergocalciferol, (DRISDOL) 1.25 MG (50000 UNIT) CAPS capsule ONE CAPSULE TWICE WEEKLY ON TUESDAYS/FRIDAYS  . vitamin E 1000 UNIT capsule Take 1,000 Units by mouth daily.   [name] Kitchen zolpidem (AMBIEN) 10 MG tablet Take 10 mg by mouth at bedtime as needed. For sleep   No facility-administered encounter medications on file as of 10/15/2020.    Patient Active Problem List   Diagnosis Date Noted  . At risk for heart disease 04/15/2020  . Other hyperlipidemia with hypertriglyceridemia 04/15/2020  . Prediabetes 01/01/2020  . Vitamin D deficiency 01/01/2020  . Vitamin B 12 deficiency 01/01/2020  . At risk for diabetes mellitus 01/01/2020  . Class 1 obesity with serious comorbidity and  body mass index (BMI) of 32.0 to 32.9 in adult 01/01/2020  . 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 Disturbance of smell and taste 11/15/2019  . Depression 03/06/2019  . Diverticulosis 06/14/2018  . Hepatic steatosis 06/14/2018  . 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 Decreased pedal pulses 06/30/2016  . Pain in both lower extremities 06/30/2016  . Facial pain, atypical 06/08/2016  . Fibroids 02/12/2016  . Chest pain 07/07/2015  . Dizziness and giddiness 07/07/2015  . Leiomyoma of uterus 07/07/2015  . Lower abdominal pain 07/07/2015  . Hip pain 03/03/2015  . 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 Multiple sclerosis ([place]) 06/26/2014  . 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 Other fatigue 06/26/2014  . Dysesthesia 06/26/2014  . Depression with anxiety 06/26/2014  . 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 Insomnia 06/26/2014  . Low serum vitamin D 06/26/2014    Conditions to be addressed/monitored: MS, Depression, and Class 1 Obesity; Grief due to recent loss of spouse  Care Plan : Social Work [place]  Plan  Updates made by Daneen Schick since 10/15/2020 12:00 AM    Problem: Caregiver Stress Resolved 10/15/2020    Long-Range Goal: Caregiver Coping Optimized Completed 10/15/2020  Start Date: 07/01/2020  Expected End Date: 09/29/2020  Recent Progress: On track  Priority: High  Note:   Current Barriers:  . Limited social support . Caregiver Stress with limited ability to care for spouse . Chronic disease management support and education needs related to Depression and MS    Social Work Clinical Goal(s):  [name] Kitchen Over the next 90 days, patient will work with SW to address concerns related to caregiver stress  Interventions: . 1:1 collaboration with [name], MD regarding development and update of comprehensive plan of care as evidenced by provider attestation and co-signature . Inter-disciplinary care team collaboration (see longitudinal plan of care) . Successful outbound call placed to the patient  . Patients  spouse passed away approximately 2 months ago . Goal Closed    Problem: Quality of Life (General Plan of Care)     Long-Range Goal: Quality of Life Maintained   Start Date: 10/15/2020  Expected End Date: 02/12/2021  Priority: Medium  Note:   Current Barriers:  . Chronic disease management support and education needs related to  MS, Depression, and Class 1 Obesity   . Recent loss of spouse  Midwife):  . patient will work with SW to identify and address any acute and/or chronic care coordination needs related to the self health management of  MS, Depression, and Class 1 Obesity   . Patient will participate in grief share with local church to assist with building a support network following the loss of her spouse  SW Interventions:  . Inter-disciplinary care team collaboration (see longitudinal plan of care) . Collaboration with [name], MD regarding development and update of comprehensive plan of care as evidenced by provider attestation and co-signature . Successful outbound call placed to the patient to assess for care coordination needs . Discussed the patient began participating in a grief share group at a local church to build a support network following the loss of her spouse . Patient reports this group meets every Sunday - she has attended one meeting and does plan to go back . Determined the patient has completed paperwork to receive 50% of her spouse's pension . Discussed patient continues to have same health plan coverage and will remain a member her entire lifetime . Determined the patient has contact Social Security to inquire about survivors benefits - she has a phone appointment June 15th . Performed chart review to note patient did attend allergist appointment on 5.19.22 . 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 Patient was prescribed an Epipen . Unfortunately- patient is unable to afford the co-pay amount of $100   . Accessed GoodRx to determine there is not a coupon at a lower cost for this medication . Discussed the patients Covid titers were checked and she does have some which are low. The allergist recommends patient undergo a test to determine if she would be allergic to the vaccine . Patient reports she is unsure if she would like to proceed with this testing considering she is very nervous about taking this vaccine due to past reaction to medications and the flu vaccine . Advised the patient to speak with her primary care provider and Neurologist during [place] appointments to discuss concerns and obtain their recommendations . Patient reports she has an appointment on 6.9.22 with  her primary provider Dr. Baird Cancer that she is unsure if she needs to fast for . Collaboration with Michelle Nasuti, CMA to determine patient will need to fast for blood work . Advised the patient to fast - patient stated understanding . Discussed plans for SW to follow up with the patient in August . Encouraged the patient to contact SW as needed prior to next scheduled appointment  Patient Goals/Self-Care Activities . patient will:   -  Participate in grief share support group -Work with [place] to determine if eligible to receive survivors benefits -Follow up with primary provider and neurologist regarding plan for covid vaccine allergy testing -Contact SW as needed prior to next scheduled call  Follow Up Plan:  SW will follow up with the patient over the next two months       Follow Up Plan: SW will follow up with patient by phone over the next two months      Daneen Schick, BSW, CDP Social Worker, Certified Dementia Practitioner [place] [facility] [phone]  Total time spent performing care coordination and/or care management activities with the patient by phone or face to face = 65 minutes.

## 2020-10-15 NOTE — Patient Instructions (Signed)
Social Worker Visit Information  Goals we discussed today:  Goals Addressed            This Visit's Progress   . COMPLETED: Caregiver coping optimized       Timeframe:  Long-Range Goal Priority:  High Start Date:  2.22.22                           Expected End Date:   5.23.22                    Goal Closed      . Quality of Life Maintained       Timeframe:  Long-Range Goal Priority:  Medium Start Date: 6.8.22                            Expected End Date:   10.6.22                    Next planned outreach: 8.11.22  Patient Goals/Self-Care Activities . patient will:   - Participate in grief share support group -Work with [place] to determine if eligible to receive survivors benefits -Follow up with primary provider and neurologist regarding plan for covid vaccine allergy testing -Contact SW as needed prior to next scheduled call        Follow Up Plan: SW will follow up with patient by phone over the next two months   Daneen Schick, BSW, CDP Social Worker, Certified Dementia Practitioner [place] [facility] [phone]

## 2020-10-16 ENCOUNTER — Other Ambulatory Visit: Payer: Self-pay

## 2020-10-16 ENCOUNTER — Ambulatory Visit (INDEPENDENT_AMBULATORY_CARE_PROVIDER_SITE_OTHER): Payer: Medicare Other | Admitting: Internal Medicine

## 2020-10-16 ENCOUNTER — Encounter: Payer: Self-pay | Admitting: Internal Medicine

## 2020-10-16 ENCOUNTER — Ambulatory Visit (INDEPENDENT_AMBULATORY_CARE_PROVIDER_SITE_OTHER): Payer: Medicare Other

## 2020-10-16 VITALS — BP 126/80 | HR 76 | Temp 97.9°F | Ht 66.0 in | Wt 212.4 lb

## 2020-10-16 VITALS — BP 126/80 | HR 76 | Temp 97.9°F | Ht 66.0 in | Wt 212.0 lb

## 2020-10-16 DIAGNOSIS — F4321 Adjustment disorder with depressed mood: Secondary | ICD-10-CM

## 2020-10-16 DIAGNOSIS — E6609 Other obesity due to excess calories: Secondary | ICD-10-CM

## 2020-10-16 DIAGNOSIS — R0982 Postnasal drip: Secondary | ICD-10-CM | POA: Diagnosis not present

## 2020-10-16 DIAGNOSIS — F3342 Major depressive disorder, recurrent, in full remission: Secondary | ICD-10-CM | POA: Diagnosis not present

## 2020-10-16 DIAGNOSIS — E538 Deficiency of other specified B group vitamins: Secondary | ICD-10-CM | POA: Diagnosis not present

## 2020-10-16 DIAGNOSIS — R7303 Prediabetes: Secondary | ICD-10-CM | POA: Diagnosis not present

## 2020-10-16 DIAGNOSIS — Z6834 Body mass index (BMI) 34.0-34.9, adult: Secondary | ICD-10-CM

## 2020-10-16 DIAGNOSIS — Z0000 Encounter for general adult medical examination without abnormal findings: Secondary | ICD-10-CM | POA: Diagnosis not present

## 2020-10-16 MED ORDER — CYANOCOBALAMIN 1000 MCG/ML IJ SOLN
1000.0000 ug | Freq: Once | INTRAMUSCULAR | Status: AC
  Administered 2020-10-16: 1000 ug via INTRAMUSCULAR

## 2020-10-16 MED ORDER — IPRATROPIUM BROMIDE 0.06 % NA SOLN
NASAL | 5 refills | Status: DC
Start: 2020-10-16 — End: 2022-10-25

## 2020-10-16 NOTE — Patient Instructions (Signed)
Grace [name] , Thank you for taking time to come for your Medicare Wellness Visit. I appreciate your ongoing commitment to your health goals. Please review the following plan we discussed and let me know if I can assist you in the future.   Screening recommendations/referrals: Colonoscopy: completed 05/11/2018, due 05/12/2023 Mammogram: completed 08/27/2020 Bone Density: completed 01/25/2011 Recommended yearly ophthalmology/optometry visit for glaucoma screening and checkup Recommended yearly dental visit for hygiene and checkup  Vaccinations: Influenza vaccine: decline Pneumococcal vaccine: checking Tdap vaccine: completed 04/16/2020, due 04/16/2030 Shingles vaccine: n/a  Covid-19: n/a  Advanced directives: Advance directive discussed with you today. Even though you declined this today please call our office should you change your mind and we can give you the proper paperwork for you to fill out.  Conditions/risks identified: none  Next appointment: Follow up in one year for your annual wellness visit.   Preventive Care 40-64 Years, Female Preventive care refers to lifestyle choices and visits with your health care provider that can promote health and wellness. What does preventive care include? A yearly physical exam. This is also called an annual well check. Dental exams once or twice a year. Routine eye exams. Ask your health care provider how often you should have your eyes checked. Personal lifestyle choices, including: Daily care of your teeth and gums. Regular physical activity. Eating a healthy diet. Avoiding tobacco and drug use. Limiting alcohol use. Practicing safe sex. Taking low-dose aspirin daily starting at age 66. Taking vitamin and mineral supplements as recommended by your health care provider. What happens during an annual well check? The services and screenings done by your health care provider during your annual well check will depend on your age, overall health,  lifestyle risk factors, and family history of disease. Counseling  Your health care provider may ask you questions about your: Alcohol use. Tobacco use. Drug use. Emotional well-being. Home and relationship well-being. Sexual activity. Eating habits. Work and work Statistician. Method of birth control. Menstrual cycle. Pregnancy history. Screening  You may have the following tests or measurements: Height, weight, and BMI. Blood pressure. Lipid and cholesterol levels. These may be checked every 5 years, or more frequently if you are over 93 years old. Skin check. Lung cancer screening. You may have this screening every year starting at age 38 if you have a 30-pack-year history of smoking and currently smoke or have quit within the past 15 years. Fecal occult blood test (FOBT) of the stool. You may have this test every year starting at age 66. Flexible sigmoidoscopy or colonoscopy. You may have a sigmoidoscopy every 5 years or a colonoscopy every 10 years starting at age 49. Hepatitis C blood test. Hepatitis B blood test. Sexually transmitted disease (STD) testing. Diabetes screening. This is done by checking your blood sugar (glucose) after you have not eaten for a while (fasting). You may have this done every 1-3 years. Mammogram. This may be done every 1-2 years. Talk to your health care provider about when you should start having regular mammograms. This may depend on whether you have a family history of breast cancer. BRCA-related cancer screening. This may be done if you have a family history of breast, ovarian, tubal, or peritoneal cancers. Pelvic exam and Pap test. This may be done every 3 years starting at age 56. Starting at age 59, this may be done every 5 years if you have a Pap test in combination with an HPV test. Bone density scan. This is done to screen for  osteoporosis. You may have this scan if you are at high risk for osteoporosis. Discuss your test results, treatment  options, and if necessary, the need for more tests with your health care provider. Vaccines  Your health care provider may recommend certain vaccines, such as: Influenza vaccine. This is recommended every year. Tetanus, diphtheria, and acellular pertussis (Tdap, Td) vaccine. You may need a Td booster every 10 years. Zoster vaccine. You may need this after age 42. Pneumococcal 13-valent conjugate (PCV13) vaccine. You may need this if you have certain conditions and were not previously vaccinated. Pneumococcal polysaccharide (PPSV23) vaccine. You may need one or two doses if you smoke cigarettes or if you have certain conditions. Talk to your health care provider about which screenings and vaccines you need and how often you need them. This information is not intended to replace advice given to you by your health care provider. Make sure you discuss any questions you have with your health care provider. Document Released: 05/23/2015 Document Revised: 01/14/2016 Document Reviewed: 02/25/2015 Elsevier Interactive Patient Education  2017 [place] Prevention in the Home Falls can cause injuries. They can happen to people of all ages. There are many things you can do to make your home safe and to help prevent falls. What can I do on the outside of my home? Regularly fix the edges of walkways and driveways and fix any cracks. Remove anything that might make you trip as you walk through a door, such as a raised step or threshold. Trim any bushes or trees on the path to your home. Use bright outdoor lighting. Clear any walking paths of anything that might make someone trip, such as rocks or tools. Regularly check to see if handrails are loose or broken. Make sure that both sides of any steps have handrails. Any raised decks and porches should have guardrails on the edges. Have any leaves, snow, or ice cleared regularly. Use sand or salt on walking paths during winter. Clean up any  spills in your garage right away. This includes oil or grease spills. What can I do in the bathroom? Use night lights. Install grab bars by the toilet and in the tub and shower. Do not use towel bars as grab bars. Use non-skid mats or decals in the tub or shower. If you need to sit down in the shower, use a plastic, non-slip stool. Keep the floor dry. Clean up any water that spills on the floor as soon as it happens. Remove soap buildup in the tub or shower regularly. Attach bath mats securely with double-sided non-slip rug tape. Do not have throw rugs and other things on the floor that can make you trip. What can I do in the bedroom? Use night lights. Make sure that you have a light by your bed that is easy to reach. Do not use any sheets or blankets that are too big for your bed. They should not hang down onto the floor. Have a firm chair that has side arms. You can use this for support while you get dressed. Do not have throw rugs and other things on the floor that can make you trip. What can I do in the kitchen? Clean up any spills right away. Avoid walking on wet floors. Keep items that you use a lot in easy-to-reach places. If you need to reach something above you, use a strong step stool that has a grab bar. Keep electrical cords out of the way. Do not  use floor polish or wax that makes floors slippery. If you must use wax, use non-skid floor wax. Do not have throw rugs and other things on the floor that can make you trip. What can I do with my stairs? Do not leave any items on the stairs. Make sure that there are handrails on both sides of the stairs and use them. Fix handrails that are broken or loose. Make sure that handrails are as long as the stairways. Check any carpeting to make sure that it is firmly attached to the stairs. Fix any carpet that is loose or worn. Avoid having throw rugs at the top or bottom of the stairs. If you do have throw rugs, attach them to the floor  with carpet tape. Make sure that you have a light switch at the top of the stairs and the bottom of the stairs. If you do not have them, ask someone to add them for you. What else can I do to help prevent falls? Wear shoes that: Do not have high heels. Have rubber bottoms. Are comfortable and fit you well. Are closed at the toe. Do not wear sandals. If you use a stepladder: Make sure that it is fully opened. Do not climb a closed stepladder. Make sure that both sides of the stepladder are locked into place. Ask someone to hold it for you, if possible. Clearly mark and make sure that you can see: Any grab bars or handrails. First and last steps. Where the edge of each step is. Use tools that help you move around (mobility aids) if they are needed. These include: Canes. Walkers. Scooters. Crutches. Turn on the lights when you go into a dark area. Replace any light bulbs as soon as they burn out. Set up your furniture so you have a clear path. Avoid moving your furniture around. If any of your floors are uneven, fix them. If there are any pets around you, be aware of where they are. Review your medicines with your doctor. Some medicines can make you feel dizzy. This can increase your chance of falling. Ask your doctor what other things that you can do to help prevent falls. This information is not intended to replace advice given to you by your health care provider. Make sure you discuss any questions you have with your health care provider. Document Released: 02/20/2009 Document Revised: 10/02/2015 Document Reviewed: 05/31/2014 Elsevier Interactive Patient Education  2017 [employer].

## 2020-10-16 NOTE — Patient Instructions (Signed)
Managing Loss, Adult People experience loss in many different ways throughout their lives. Events such as moving, changing jobs, and losing friends can create a sense of loss. The loss may be as serious as a major health change, divorce, death of a pet, or death of a loved one. All of these types of loss are likely to create a physical and emotional reaction known as grief. Grief is the result of a major change or an absence of something or someone that you count on. Grief is a normal reaction to loss. A variety of factors can affect your grieving experience, including:  The nature of your loss.  Your relationship to what or whom you lost.  Your understanding of grief and how to manage it.  Your support system. How to manage lifestyle changes Keep to your normal routine as much as possible.  If you have trouble focusing or doing normal activities, it is acceptable to take some time away from your normal routine.  Spend time with friends and loved ones.  Eat a healthy diet, get plenty of sleep, and rest when you feel tired.   How to recognize changes  The way that you deal with your grief will affect your ability to function as you normally do. When grieving, you may experience these changes:  Numbness, shock, sadness, anxiety, anger, denial, and guilt.  Thoughts about death.  Unexpected crying.  A physical sensation of emptiness in your stomach.  Problems sleeping and eating.  Tiredness (fatigue).  Loss of interest in normal activities.  Dreaming about or imagining seeing the person who died.  A need to remember what or whom you lost.  Difficulty thinking about anything other than your loss for a period of time.  Relief. If you have been expecting the loss for a while, you may feel a sense of relief when it happens. Follow these instructions at home: Activity Express your feelings in healthy ways, such as:  Talking with others about your loss. It may be helpful to find  others who have had a similar loss, such as a support group.  Writing down your feelings in a journal.  Doing physical activities to release stress and emotional energy.  Doing creative activities like painting, sculpting, or playing or listening to music.  Practicing resilience. This is the ability to recover and adjust after facing challenges. Reading some resources that encourage resilience may help you to learn ways to practice those behaviors.   General instructions  Be patient with yourself and others. Allow the grieving process to happen, and remember that grieving takes time. ? It is likely that you may never feel completely done with some grief. You may find a way to move on while still cherishing memories and feelings about your loss. ? Accepting your loss is a process. It can take months or longer to adjust.  Keep all follow-up visits as told by your health care provider. This is important. Where to find support To get support for managing loss:  Ask your health care provider for help and recommendations, such as grief counseling or therapy.  Think about joining a support group for people who are managing a loss. Where to find more information You can find more information about managing loss from:  American Society of Clinical Oncology: www.cancer.net  American Psychological Association: www.apa.org Contact a health care provider if:  Your grief is extreme and keeps getting worse.  You have ongoing grief that does not improve.  Your body shows symptoms   of grief, such as illness.  You feel depressed, anxious, or lonely. Get help right away if:  You have thoughts about hurting yourself or others. If you ever feel like you may hurt yourself or others, or have thoughts about taking your own life, get help right away. You can go to your nearest emergency department or call:  Your local emergency services (911 in the [place]  A suicide crisis helpline, such as the  National Suicide Prevention Lifeline at 1-800-273-8255. This is open 24 hours a day. Summary  Grief is the result of a major change or an absence of someone or something that you count on. Grief is a normal reaction to loss.  The depth of grief and the period of recovery depend on the type of loss and your ability to adjust to the change and process your feelings.  Processing grief requires patience and a willingness to accept your feelings and talk about your loss with people who are supportive.  It is important to find resources that work for you and to realize that people experience grief differently. There is not one grieving process that works for everyone in the same way.  Be aware that when grief becomes extreme, it can lead to more severe issues like isolation, depression, anxiety, or suicidal thoughts. Talk with your health care provider if you have any of these issues. This information is not intended to replace advice given to you by your health care provider. Make sure you discuss any questions you have with your health care provider. Document Revised: 10/18/2019 Document Reviewed: 10/18/2019 Elsevier Patient Education  2021 Elsevier Inc.  

## 2020-10-16 NOTE — Progress Notes (Signed)
I,[name],acting as a Education administrator for [name], MD.,have documented all relevant documentation on the behalf of [name], MD,as directed by  [name], MD while in the presence of [name], MD.  This visit occurred during the SARS-CoV-2 public health emergency.  Safety protocols were in place, including screening questions prior to the visit, additional usage of staff PPE, and extensive cleaning of exam room while observing appropriate contact time as indicated for disinfecting solutions.  Subjective:     Patient ID: Grace [name] , female    DOB: 1961-05-07 , 60 y.o.   MRN: [id number]   Chief Complaint  Patient presents with   B12 Injection    HPI  She is here today for f/u vitamin B12 deficiency. Unfortunately, since her last visit she has lost her husband. She states she is doing "ok". She is also scheduled for AWV with [facility] Advisor today.     Past Medical History:  Diagnosis Date   Allergy    Anxiety    Avascular necrosis (HCC)    Back pain    Cancer (HCC)    Chest pain    Chewing difficulty    Chronic fatigue syndrome    Colon polyp    pre-cancerous   Complication of anesthesia    patient states she requires more anesthesia   Depression    Disorder of soft tissue    Diverticulosis    Dizziness    Edema, lower extremity    Fatty liver    Fibromyalgia    GERD (gastroesophageal reflux disease)    H/O breast biopsy    pre- cancerous cells   Hand fracture, left    9/87   Heart palpitations    Heart valve disease    History of Holter monitoring    Hyperlipidemia    Insomnia    Joint pain    Lactose intolerance    Lhermitte's syndrome    Migraines    Multiple food allergies    Multiple sclerosis (HCC)    dx in 11/1996   Multiple sclerosis (HCC)    Neuromuscular disorder (HCC)    Neuropathy    Numbness and tingling in hands    Obesity    Optic neuritis    OSA (obstructive sleep apnea)    no longer have OSA since  stopped taking a MS medication   Osteoarthritis    Otitis media    Palpitations    Prediabetes    Raynaud's phenomenon    Shortness of breath    Sleep apnea    Due to medication that she no longer takes.   Swallowing difficulty    Vaginitis and vulvovaginitis    Vitamin B12 deficiency    Vitamin D deficiency      Family History  Problem Relation Age of Onset   Pancreatic cancer Mother    Stroke Mother    Hypertension Mother    Hyperlipidemia Mother    Heart disease Mother    Cancer Mother    Depression Mother    Anxiety disorder Mother    Alcoholism Mother    Testicular cancer Brother    Colon cancer Maternal Uncle    Pancreatic cancer Paternal Uncle    Prostate cancer Maternal Grandfather    Colon polyps Father    Diabetes Father    Hypertension Father    Hyperlipidemia Father    Kidney disease Father    Sleep apnea Father    Obesity Father  Current Outpatient Medications:    Ascorbic Acid (VITAMIN C) 1000 MG tablet, Take 1,000 mg by mouth daily., Disp: , Rfl:    aspirin EC 81 MG tablet, Take 81 mg by mouth daily., Disp: , Rfl:    baclofen (LIORESAL) 10 MG tablet, Take 2 tablets (20 mg total) by mouth 3 (three) times daily., Disp: 180 tablet, Rfl: 11   BLACK ELDERBERRY PO, Take 300 mg by mouth in the morning and at bedtime., Disp: , Rfl:    Cholecalciferol (VITAMIN D) 50 MCG (2000 UT) CAPS, Take 2,000 Units by mouth daily. , Disp: , Rfl:    CINNAMON PO, Take 1,200 mg by mouth daily. , Disp: , Rfl:    diphenhydrAMINE (BENADRYL) 25 MG tablet, Take 25 mg by mouth 3 (three) times daily. Takes with Hydrocodone, Disp: , Rfl:    doxepin (SINEQUAN) 10 MG capsule, TAKE 1 TO 2 CAPSULES AT BEDTIME, Disp: 180 capsule, Rfl: 2   etodolac (LODINE) 500 MG tablet, Take 500 mg by mouth. 1-2 times a day, Disp: , Rfl:    famotidine (PEPCID) 20 MG tablet, TAKE 1 TABLET BY MOUTH TWICE A DAY, Disp: 180 tablet, Rfl: 1   Fexofenadine-Pseudoephedrine (ALLEGRA-D PO), Take by mouth in the  morning., Disp: , Rfl:    Flaxseed, Linseed, (FLAX SEED OIL) 1000 MG CAPS, Take 1,200 mg by mouth daily. , Disp: , Rfl:    gabapentin (NEURONTIN) 300 MG capsule, Take 1 capsule (300 mg total) by mouth 3 (three) times daily., Disp: 90 capsule, Rfl: 11   HYDROcodone-acetaminophen (NORCO/VICODIN) 5-325 MG tablet, Take 1 tablet by mouth 3 (three) times daily as needed for moderate pain., Disp: 90 tablet, Rfl: 0   ipratropium (ATROVENT) 0.06 % nasal spray, Place 2 sprays into both nostrils 2 (two) times daily., Disp: , Rfl:    ipratropium (ATROVENT) 0.06 % nasal spray, Use 2 sprays each nostril bid prn drainage, Disp: 30 mL, Rfl: 5   L-Lysine 1000 MG TABS, Take by mouth. As needed, Disp: , Rfl:    Lactobacillus (PROBIOTIC ACIDOPHILUS PO), Take by mouth daily. Digestive Advantage BC   bacillus coagulans, Disp: , Rfl:    levocetirizine (XYZAL) 5 MG tablet, TAKE 1 TABLET BY MOUTH EVERY DAY IN THE EVENING, Disp: 90 tablet, Rfl: 2   LORazepam (ATIVAN) 1 MG tablet, Take 1 tablet (1 mg total) by mouth at bedtime., Disp: 30 tablet, Rfl: 5   Lutein-Bilberry (BILBERRY PLUS LUTEIN PO), Take by mouth daily., Disp: , Rfl:    Melatonin 10 MG TABS, Take 1 tablet by mouth at bedtime. , Disp: , Rfl:    Moringa 500 MG CAPS, Take 2 capsules by mouth daily., Disp: , Rfl:    Multiple Vitamins-Minerals (HAIR/SKIN/NAILS/BIOTIN) TABS, Take by mouth daily. , Disp: , Rfl:    Nutritional Supplements (NUTRITIONAL SUPPLEMENT PO), Take 2.1 g by mouth in the morning and at bedtime. LION'S MANE, Disp: , Rfl:    nystatin-triamcinolone ointment (MYCOLOG), Apply 1 application topically 2 (two) times daily., Disp: 30 g, Rfl: 2   rizatriptan (MAXALT-MLT) 10 MG disintegrating tablet, Take 1 tablet (10 mg total) by mouth as needed for migraine. May repeat in 2 hours if needed.  Max of 2 tablets in 24 hours, and 10 tablets per month, Disp: 10 tablet, Rfl: 11   temazepam (RESTORIL) 15 MG capsule, Take 1 capsule (15 mg total) by mouth at bedtime  as needed for sleep., Disp: 30 capsule, Rfl: 2   timolol (BETIMOL) 0.5 % ophthalmic solution, Place 1  drop into the right eye 2 (two) times daily., Disp: , Rfl:    tiZANidine (ZANAFLEX) 2 MG tablet, TAKE UP TO 4 PILLS A DAY, Disp: 120 tablet, Rfl: 1   Vitamin D, Ergocalciferol, (DRISDOL) 1.25 MG (50000 UNIT) CAPS capsule, ONE CAPSULE TWICE WEEKLY ON TUESDAYS/FRIDAYS, Disp: 24 capsule, Rfl: 2   vitamin E 1000 UNIT capsule, Take 1,000 Units by mouth daily. , Disp: , Rfl:    zolpidem (AMBIEN) 10 MG tablet, Take 10 mg by mouth at bedtime as needed. For sleep, Disp: , Rfl:    EPINEPHrine 0.3 mg/0.3 mL IJ SOAJ injection, Use as directed for life-threatening allergic reaction. (Patient not taking: Reported on 10/16/2020), Disp: 1 each, Rfl: 3   Allergies  Allergen Reactions   Lamictal [Lamotrigine] Shortness Of Breath, Itching and Swelling    Tongue swells   Mobic [Meloxicam] Anaphylaxis   Ultram [Tramadol Hcl] Anaphylaxis   Nefazodone Itching and Swelling    Other reaction(s): Arthralgia (Joint Pain); Throat swelling, mouth sores   Betaseron [Interferon Beta-1b] Other (See Comments)    Caused avascular necrosis   Codeine Itching   Copaxone [Glatiramer Acetate] Other (See Comments)    Altered mental status: mental changes, anxiety, motion sickness   Diclofenac Itching   Gabapentin Itching    Itching if she takes more than 300mg    Latex     Latex band-aids cause redness and tears your skin   Nitrofurantoin     Causing itching all over, tongue swelling, chest heaviness, swollen lips, cough, rapid HR, swollen eyelids, red eyes/gums, lips red/white per pt.   Oxcarbazepine Itching and Other (See Comments)    Dizziness, throat hurts   Teriflunomide Diarrhea    Hair loss, joint pains, elevated liver enzymes abugio (joint pains and liver issues)   Trazodone And Nefazodone     Mouth sores, tongue swelling   Tape Rash     Review of Systems  Constitutional: Negative.   HENT:  Positive for  postnasal drip.   Respiratory: Negative.    Cardiovascular: Negative.   Gastrointestinal: Negative.   Psychiatric/Behavioral: Negative.    All other systems reviewed and are negative.   Today's Vitals   10/16/20 1418  BP: 126/80  Pulse: 76  Temp: 97.9 F (36.6 C)  TempSrc: Oral  Weight: 212 lb 6.4 oz (96.3 kg)  Height: 5\' 6"  (1.676 m)   Body mass index is 34.28 kg/m.  Wt Readings from Last 3 Encounters:  10/16/20 212 lb (96.2 kg)  10/16/20 212 lb 6.4 oz (96.3 kg)  09/25/20 216 lb (98 kg)    BP Readings from Last 3 Encounters:  10/16/20 126/80  10/16/20 126/80  09/25/20 (!) 118/94    Objective:  Physical Exam Vitals and nursing note reviewed.  Constitutional:      Appearance: Normal appearance.  HENT:     Head: Normocephalic and atraumatic.     Nose:     Comments: Masked     Mouth/Throat:     Comments: Masked  Cardiovascular:     Rate and Rhythm: Normal rate and regular rhythm.     Heart sounds: Normal heart sounds.  Pulmonary:     Effort: Pulmonary effort is normal.     Breath sounds: Normal breath sounds.  Skin:    General: Skin is warm.  Neurological:     General: No focal deficit present.     Mental Status: She is alert.  Psychiatric:        Mood and Affect: Mood normal.  Behavior: Behavior normal.        Assessment And Plan:     1. Vitamin B 12 deficiency Comments: I will check vitamin B12 level. She was also administered vitamin B12 IM x1.  - cyanocobalamin ((VITAMIN B-12)) injection 1,000 mcg - Vitamin B12  2. Prediabetes Comments: Her a1c has been elevated in the past. I will recheck an a1c today. Encouraged to lmit her intake of sweetened beverages, including diet drinks.   3. Recurrent major depressive disorder, in full remission ([place]) Comments: Chronic, controlled on meds.   4. Postnasal drip Comments: She was given refill of Atrovent NS as requested.   5. Grief Comments: She is encouraged to seek grief counseling, she does  plan to pursue this.   6. Class 1 obesity due to excess calories with serious comorbidity and body mass index (BMI) of 34.0 to 34.9 in adult  She is encouraged to strive for BMI less than 30 to decrease cardiac risk. Advised to aim for at least 150 minutes of exercise per week.    Patient was given opportunity to ask questions. Patient verbalized understanding of the plan and was able to repeat key elements of the plan. All questions were answered to their satisfaction.   I, [name], MD, have reviewed all documentation for this visit. The documentation on 10/16/20 for the exam, diagnosis, procedures, and orders are all accurate and complete.   IF YOU HAVE BEEN REFERRED TO A SPECIALIST, IT MAY TAKE 1-2 WEEKS TO SCHEDULE/PROCESS THE REFERRAL. IF YOU HAVE NOT HEARD FROM US/SPECIALIST IN TWO WEEKS, PLEASE GIVE [place] A CALL AT [phone].   THE PATIENT IS ENCOURAGED TO PRACTICE SOCIAL DISTANCING DUE TO THE COVID-19 PANDEMIC.

## 2020-10-16 NOTE — Progress Notes (Signed)
This visit occurred during the SARS-CoV-2 public health emergency.  Safety protocols were in place, including screening questions prior to the visit, additional usage of staff PPE, and extensive cleaning of exam room while observing appropriate contact time as indicated for disinfecting solutions.  Subjective:   Grace [name] is a 60 y.o. female who presents for Medicare Annual (Subsequent) preventive examination.  Review of Systems     Cardiac Risk Factors include: dyslipidemia;obesity (BMI >30kg/m2);sedentary lifestyle     Objective:    Today's Vitals   10/16/20 1438 10/16/20 1457  BP: 126/80   Pulse: 76   Temp: 97.9 F (36.6 C)   TempSrc: Oral   Weight: 212 lb (96.2 kg)   Height: 5\' 6"  (1.676 m)   PainSc:  5    Body mass index is 34.22 kg/m.  Advanced Directives 10/16/2020 10/10/2019 03/13/2019 04/27/2018 06/30/2016 02/12/2016 02/04/2016  Does Patient Have a Medical Advance Directive? No No No No No No No  Would patient like information on creating a medical advance directive? No - Patient declined Yes (MAU/Ambulatory/Procedural Areas - Information given) - Yes (MAU/Ambulatory/Procedural Areas - Information given) Yes (MAU/Ambulatory/Procedural Areas - Information given) No - patient declined information Yes - Educational materials given    Current Medications (verified) Outpatient Encounter Medications as of 10/16/2020  Medication Sig   Ascorbic Acid (VITAMIN C) 1000 MG tablet Take 1,000 mg by mouth daily.   aspirin EC 81 MG tablet Take 81 mg by mouth daily.   baclofen (LIORESAL) 10 MG tablet Take 2 tablets (20 mg total) by mouth 3 (three) times daily.   BLACK ELDERBERRY PO Take 300 mg by mouth in the morning and at bedtime.   Cholecalciferol (VITAMIN D) 50 MCG (2000 UT) CAPS Take 2,000 Units by mouth daily.    CINNAMON PO Take 1,200 mg by mouth daily.    diphenhydrAMINE (BENADRYL) 25 MG tablet Take 25 mg by mouth 3 (three) times daily. Takes with Hydrocodone   doxepin  (SINEQUAN) 10 MG capsule TAKE 1 TO 2 CAPSULES AT BEDTIME   EPINEPHrine 0.3 mg/0.3 mL IJ SOAJ injection Use as directed for life-threatening allergic reaction. (Patient not taking: Reported on 10/16/2020)   etodolac (LODINE) 500 MG tablet Take 500 mg by mouth. 1-2 times a day   famotidine (PEPCID) 20 MG tablet TAKE 1 TABLET BY MOUTH TWICE A DAY   Fexofenadine-Pseudoephedrine (ALLEGRA-D PO) Take by mouth in the morning.   Flaxseed, Linseed, (FLAX SEED OIL) 1000 MG CAPS Take 1,200 mg by mouth daily.    gabapentin (NEURONTIN) 300 MG capsule Take 1 capsule (300 mg total) by mouth 3 (three) times daily.   HYDROcodone-acetaminophen (NORCO/VICODIN) 5-325 MG tablet Take 1 tablet by mouth 3 (three) times daily as needed for moderate pain.   ipratropium (ATROVENT) 0.06 % nasal spray Place 2 sprays into both nostrils 2 (two) times daily.   L-Lysine 1000 MG TABS Take by mouth. As needed   Lactobacillus (PROBIOTIC ACIDOPHILUS PO) Take by mouth daily. Digestive Advantage BC   bacillus coagulans   levocetirizine (XYZAL) 5 MG tablet TAKE 1 TABLET BY MOUTH EVERY DAY IN THE EVENING   LORazepam (ATIVAN) 1 MG tablet Take 1 tablet (1 mg total) by mouth at bedtime.   Lutein-Bilberry (BILBERRY PLUS LUTEIN PO) Take by mouth daily.   Melatonin 10 MG TABS Take 1 tablet by mouth at bedtime.    Moringa 500 MG CAPS Take 2 capsules by mouth daily.   Multiple Vitamins-Minerals (HAIR/SKIN/NAILS/BIOTIN) TABS Take by mouth daily.  Nutritional Supplements (NUTRITIONAL SUPPLEMENT PO) Take 2.1 g by mouth in the morning and at bedtime. LION'S MANE   nystatin-triamcinolone ointment (MYCOLOG) Apply 1 application topically 2 (two) times daily.   rizatriptan (MAXALT-MLT) 10 MG disintegrating tablet Take 1 tablet (10 mg total) by mouth as needed for migraine. May repeat in 2 hours if needed.  Max of 2 tablets in 24 hours, and 10 tablets per month   temazepam (RESTORIL) 15 MG capsule Take 1 capsule (15 mg total) by mouth at bedtime as  needed for sleep.   timolol (BETIMOL) 0.5 % ophthalmic solution Place 1 drop into the right eye 2 (two) times daily.   tiZANidine (ZANAFLEX) 2 MG tablet TAKE UP TO 4 PILLS A DAY   Vitamin D, Ergocalciferol, (DRISDOL) 1.25 MG (50000 UNIT) CAPS capsule ONE CAPSULE TWICE WEEKLY ON TUESDAYS/FRIDAYS   vitamin E 1000 UNIT capsule Take 1,000 Units by mouth daily.    zolpidem (AMBIEN) 10 MG tablet Take 10 mg by mouth at bedtime as needed. For sleep   No facility-administered encounter medications on file as of 10/16/2020.    Allergies (verified) Lamictal [lamotrigine], Mobic [meloxicam], Ultram [tramadol hcl], Nefazodone, Betaseron [interferon beta-1b], Codeine, Copaxone [glatiramer acetate], Diclofenac, Gabapentin, Latex, Nitrofurantoin, Oxcarbazepine, Teriflunomide, Trazodone and nefazodone, and Tape   History: Past Medical History:  Diagnosis Date   Allergy    Anxiety    Avascular necrosis (HCC)    Back pain    Cancer (HCC)    Chest pain    Chewing difficulty    Chronic fatigue syndrome    Colon polyp    pre-cancerous   Complication of anesthesia    patient states she requires more anesthesia   Depression    Disorder of soft tissue    Diverticulosis    Dizziness    Edema, lower extremity    Fatty liver    Fibromyalgia    GERD (gastroesophageal reflux disease)    H/O breast biopsy    pre- cancerous cells   Hand fracture, left    9/87   Heart palpitations    Heart valve disease    History of Holter monitoring    Hyperlipidemia    Insomnia    Joint pain    Lactose intolerance    Lhermitte's syndrome    Migraines    Multiple food allergies    Multiple sclerosis (HCC)    dx in 11/1996   Multiple sclerosis (HCC)    Neuromuscular disorder (HCC)    Neuropathy    Numbness and tingling in hands    Obesity    Optic neuritis    OSA (obstructive sleep apnea)    no longer have OSA since stopped taking a MS medication   Osteoarthritis    Otitis media    Palpitations     Prediabetes    Raynaud's phenomenon    Shortness of breath    Sleep apnea    Due to medication that she no longer takes.   Swallowing difficulty    Vaginitis and vulvovaginitis    Vitamin B12 deficiency    Vitamin D deficiency    Past Surgical History:  Procedure Laterality Date   ABDOMINAL HYSTERECTOMY Bilateral 02/12/2016   Procedure: HYSTERECTOMY ABDOMINAL WITH BILATERAL SALPINGO OOPHERECTOMY;  Surgeon: Dian Queen, MD;  Location: [facility];  Service: Gynecology;  Laterality: Bilateral;   BREAST EXCISIONAL BIOPSY     core/left breast   CATARACT EXTRACTION, BILATERAL  06/2019   CHOLECYSTECTOMY  1983   COLONOSCOPY  1997/2001   DILATION AND CURETTAGE OF UTERUS     ENDOMETRIAL ABLATION  2012   LAPAROSCOPIC ABDOMINAL EXPLORATION     ovaries and intestines bond together   LEFT HEART CATHETERIZATION WITH CORONARY ANGIOGRAM N/A 06/21/2011   Procedure: LEFT HEART CATHETERIZATION WITH CORONARY ANGIOGRAM;  Surgeon: Leonie Man, MD;  Location: [facility] C[facility];  Service: Cardiovascular;  Laterality: N/A;   LUMBAR PUNCTURE     11/01/1996   SALPINGOOPHORECTOMY Bilateral 02/12/2016   Procedure: BILATERAL SALPINGO OOPHORECTOMY;  Surgeon: Dian Queen, MD;  Location: [facility];  Service: Gynecology;  Laterality: Bilateral;   TOTAL HIP ARTHROPLASTY     left hip  6/02  /right hip12/04   Family History  Problem Relation Age of Onset   Pancreatic cancer Mother    Stroke Mother    Hypertension Mother    Hyperlipidemia Mother    Heart disease Mother    Cancer Mother    Depression Mother    Anxiety disorder Mother    Alcoholism Mother    Testicular cancer Brother    Colon cancer Maternal Uncle    Pancreatic cancer Paternal Uncle    Prostate cancer Maternal Grandfather    Colon polyps Father    Diabetes Father    Hypertension Father    Hyperlipidemia Father    Kidney disease Father    Sleep apnea Father    Obesity Father    Social History   Socioeconomic History   Marital status:  Widowed    Spouse name: Not on file   Number of children: Not on file   Years of education: Not on file   Highest education level: Not on file  Occupational History   Occupation: disabled  Tobacco Use   Smoking status: Former    Packs/day: 0.25    Years: 8.00    Pack years: 2.00    Types: Cigarettes    Quit date: 07/01/1995    Years since quitting: 25.3   Smokeless tobacco: Never  Vaping Use   Vaping Use: Never used  Substance and Sexual Activity   Alcohol use: No    Alcohol/week: 0.0 standard drinks   Drug use: Yes    Types: Hydrocodone   Sexual activity: Not Currently  Other Topics Concern   Not on file  Social History Narrative   Not on file   Social Determinants of Health   Financial Resource Strain: Low Risk    Difficulty of Paying Living Expenses: Not hard at all  Food Insecurity: No Food Insecurity   Worried About Charity fundraiser in the Last Year: Never true   [place] in the Last Year: Never true  Transportation Needs: No Transportation Needs   Lack of Transportation (Medical): No   Lack of Transportation (Non-Medical): No  Physical Activity: Inactive   Days of Exercise per Week: 0 days   Minutes of Exercise per Session: 0 min  Stress: No Stress Concern Present   Feeling of Stress : Not at all  Social Connections: Not on file    Tobacco Counseling Counseling given: Not Answered   Clinical Intake:  Pre-visit preparation completed: Yes  Pain : 0-10 Pain Score: 5  Pain Type: Chronic pain Pain Location: Foot Pain Orientation: Right, Left Pain Descriptors / Indicators: Burning, Tingling Pain Onset: More than a month ago Pain Frequency: Constant     Nutritional Status: BMI > 30  Obese Nutritional Risks: None Diabetes: No  How often do you need to have someone help you  when you read instructions, pamphlets, or other written materials from your doctor or pharmacy?: 1 - Never What is the last grade level you completed in school?: 12th  grade  Diabetic?no  Interpreter Needed?: No  Information entered by :: NAllen LPN   Activities of Daily Living In your present state of health, do you have any difficulty performing the following activities: 10/16/2020  Hearing? N  Vision? Y  Difficulty concentrating or making decisions? Y  Walking or climbing stairs? Y  Dressing or bathing? N  Doing errands, shopping? N  Preparing Food and eating ? N  Using the Toilet? N  In the past six months, have you accidently leaked urine? Y  Do you have problems with loss of bowel control? Y  Managing your Medications? N  Managing your Finances? N  Housekeeping or managing your Housekeeping? N  Some recent data might be hidden    Patient Care Team: [name], MD as PCP - General (Internal Medicine) Sater, Nanine Means, MD (Neurology) Caudill, Kennieth Francois, [facility] (Inactive) (Pharmacist) Daneen Schick as Social Worker  Indicate any recent Medical Services you may have received from other than [facility] providers in the past year (date may be approximate).     Assessment:   This is a routine wellness examination for Dezra.  Hearing/Vision screen Vision Screening - Comments:: Regular eye exams, [facility]  Dietary issues and exercise activities discussed: Current Exercise Habits: The patient does not participate in regular exercise at present   Goals Addressed             This Visit's Progress    Patient Stated       10/16/2020, wants to get under 200 pounds        Depression Screen PHQ 2/9 Scores 10/16/2020 10/16/2020 10/16/2020 04/16/2020 11/22/2019 11/07/2019 10/10/2019  PHQ - 2 Score - 3 - 2 5 0 1  PHQ- 9 Score - 14 - 9 20 - 11  Exception Documentation Other- indicate reason in comment box - Medical reason - - Other- indicate reason in comment box Other- indicate reason in comment box  Not completed completed by MD - - - - she is a 0 today -    Fall Risk Fall Risk  10/16/2020 10/10/2019 04/11/2019 03/13/2019 02/01/2019  Falls in  the past year? 0 1 1 0 0  Comment - clumsy, has MS - - -  Number falls in past yr: - - 0 0 -  Comment - - - - -  Injury with Fall? - - 1 - -  Comment - - brusing - -  Risk for fall due to : Medication side effect History of fall(s);Medication side effect - Medication side effect -  Risk for fall due to: Comment - - - - -  Follow up Falls evaluation completed;Education provided;Falls prevention discussed Falls evaluation completed;Education provided;Falls prevention discussed - Falls evaluation completed;Education provided;Falls prevention discussed -    FALL RISK PREVENTION PERTAINING TO THE HOME:  Any stairs in or around the home? Yes  If so, are there any without handrails? Yes  Home free of loose throw rugs in walkways, pet beds, electrical cords, etc? Yes  Adequate lighting in your home to reduce risk of falls? Yes   ASSISTIVE DEVICES UTILIZED TO PREVENT FALLS:  Life alert? No  Use of a cane, walker or w/c? No  Grab bars in the bathroom? No  Shower chair or bench in shower? Yes  Elevated toilet seat or a handicapped toilet? Yes  TIMED UP AND GO:  Was the test performed? No .     Gait steady and fast without use of assistive device  Cognitive Function:     6CIT Screen 10/16/2020 10/10/2019 03/13/2019 04/27/2018  What Year? 0 points 0 points 0 points 0 points  What month? 0 points 0 points 0 points 0 points  What time? 0 points 0 points 0 points 0 points  Count back from 20 0 points 0 points 0 points 0 points  Months in reverse 2 points 2 points 2 points 2 points  Repeat phrase 2 points 4 points 6 points 2 points  Total Score 4 6 8 4     Immunizations Immunization History  Administered Date(s) Administered   Tdap 03/21/2012, 04/16/2020    TDAP status: Up to date  Flu Vaccine status: Declined, Education has been provided regarding the importance of this vaccine but patient still declined. Advised may receive this vaccine at local pharmacy or Health Dept. Aware to  provide a copy of the vaccination record if obtained from local pharmacy or Health Dept. Verbalized acceptance and understanding.  Pneumococcal vaccine status: Due, Education has been provided regarding the importance of this vaccine. Advised may receive this vaccine at local pharmacy or Health Dept. Aware to provide a copy of the vaccination record if obtained from local pharmacy or Health Dept. Verbalized acceptance and understanding.  Covid-19 vaccine status: Declined, Education has been provided regarding the importance of this vaccine but patient still declined. Advised may receive this vaccine at local pharmacy or Health Dept.or vaccine clinic. Aware to provide a copy of the vaccination record if obtained from local pharmacy or Health Dept. Verbalized acceptance and understanding.  Qualifies for Shingles Vaccine? No   Zostavax completed No   Shingrix Completed?: No.    Education has been provided regarding the importance of this vaccine. Patient has been advised to call insurance company to determine out of pocket expense if they have not yet received this vaccine. Advised may also receive vaccine at local pharmacy or Health Dept. Verbalized acceptance and understanding.  Screening Tests Health Maintenance  Topic Date Due   Pneumococcal Vaccine 39-73 Years old (1 - PCV) Never done   COVID-19 Vaccine (1) 11/01/2020 (Originally 04/24/1966)   Zoster Vaccines- Shingrix (1 of 2) 01/16/2021 (Originally 04/24/1982)   HIV Screening  04/16/2021 (Originally 04/24/1978)   INFLUENZA VACCINE  12/08/2020   MAMMOGRAM  08/28/2022   COLONOSCOPY (Pts 45-88yrs Insurance coverage will need to be confirmed)  05/12/2023   TETANUS/TDAP  04/16/2030   Hepatitis C Screening  Completed   HPV VACCINES  Aged Out   PAP SMEAR-Modifier  Discontinued    Health Maintenance  Health Maintenance Due  Topic Date Due   Pneumococcal Vaccine 26-29 Years old (1 - PCV) Never done    Colorectal cancer screening: Type of  screening: Colonoscopy. Completed 05/11/2018. Repeat every 5 years  Mammogram status: Completed 08/27/2020. Repeat every year  Bone Density status: Completed 01/25/2011.   Lung Cancer Screening: (Low Dose CT Chest recommended if Age 80-80 years, 30 pack-year currently smoking OR have quit w/in 15years.) does not qualify.   Lung Cancer Screening Referral: no   Additional Screening:  Hepatitis C Screening: does qualify; Completed 10/20/2005  Vision Screening: Recommended annual ophthalmology exams for early detection of glaucoma and other disorders of the eye. Is the patient up to date with their annual eye exam?  Yes  Who is the provider or what is the name of the office in which the  patient attends annual eye exams? [place] If pt is not established with a provider, would they like to be referred to a provider to establish care? No .   Dental Screening: Recommended annual dental exams for proper oral hygiene  Community Resource Referral / Chronic Care Management: CRR required this visit?  No   CCM required this visit?  Yes      Plan:     I have personally reviewed and noted the following in the patient's chart:   Medical and social history Use of alcohol, tobacco or illicit drugs  Current medications and supplements including opioid prescriptions.  Functional ability and status Nutritional status Physical activity Advanced directives List of other physicians Hospitalizations, surgeries, and ER visits in previous 12 months Vitals Screenings to include cognitive, depression, and falls Referrals and appointments  In addition, I have reviewed and discussed with patient certain preventive protocols, quality metrics, and best practice recommendations. A written personalized care plan for preventive services as well as general preventive health recommendations were provided to patient.     Kellie Simmering, LPN   08/11/2759   Nurse Notes:

## 2020-10-17 LAB — VITAMIN B12: Vitamin B-12: 699 pg/mL (ref 232–1245)

## 2020-10-21 ENCOUNTER — Telehealth: Payer: Self-pay

## 2020-10-21 ENCOUNTER — Telehealth: Payer: Medicare Other

## 2020-10-21 ENCOUNTER — Ambulatory Visit: Payer: Self-pay

## 2020-10-21 DIAGNOSIS — F3342 Major depressive disorder, recurrent, in full remission: Secondary | ICD-10-CM | POA: Diagnosis not present

## 2020-10-21 DIAGNOSIS — E6609 Other obesity due to excess calories: Secondary | ICD-10-CM

## 2020-10-21 DIAGNOSIS — G35 Multiple sclerosis: Secondary | ICD-10-CM

## 2020-10-21 DIAGNOSIS — E559 Vitamin D deficiency, unspecified: Secondary | ICD-10-CM

## 2020-10-21 DIAGNOSIS — R7303 Prediabetes: Secondary | ICD-10-CM

## 2020-10-21 NOTE — Chronic Care Management (AMB) (Signed)
Chronic Care Management Pharmacy Assistant   Name: Dominga Mcduffie  MRN: [id number] DOB: 1960/08/04  Reason for Encounter: Patient Assistance Coordination   10/21/2020- Filled out provider page for [employer] Epi-pen application with [employer] patient assistance program. Faxing to Dr Neldon Mc office for signature, patient has portion of application for her to fill out and sign.  Medications: Outpatient Encounter Medications as of 10/21/2020  Medication Sig   Ascorbic Acid (VITAMIN C) 1000 MG tablet Take 1,000 mg by mouth daily.   aspirin EC 81 MG tablet Take 81 mg by mouth daily.   baclofen (LIORESAL) 10 MG tablet Take 2 tablets (20 mg total) by mouth 3 (three) times daily.   BLACK ELDERBERRY PO Take 300 mg by mouth in the morning and at bedtime.   Cholecalciferol (VITAMIN D) 50 MCG (2000 UT) CAPS Take 2,000 Units by mouth daily.    CINNAMON PO Take 1,200 mg by mouth daily.    diphenhydrAMINE (BENADRYL) 25 MG tablet Take 25 mg by mouth 3 (three) times daily. Takes with Hydrocodone   doxepin (SINEQUAN) 10 MG capsule TAKE 1 TO 2 CAPSULES AT BEDTIME   EPINEPHrine 0.3 mg/0.3 mL IJ SOAJ injection Use as directed for life-threatening allergic reaction. (Patient not taking: Reported on 10/16/2020)   etodolac (LODINE) 500 MG tablet Take 500 mg by mouth. 1-2 times a day   famotidine (PEPCID) 20 MG tablet TAKE 1 TABLET BY MOUTH TWICE A DAY   Fexofenadine-Pseudoephedrine (ALLEGRA-D PO) Take by mouth in the morning.   Flaxseed, Linseed, (FLAX SEED OIL) 1000 MG CAPS Take 1,200 mg by mouth daily.    gabapentin (NEURONTIN) 300 MG capsule Take 1 capsule (300 mg total) by mouth 3 (three) times daily.   HYDROcodone-acetaminophen (NORCO/VICODIN) 5-325 MG tablet Take 1 tablet by mouth 3 (three) times daily as needed for moderate pain.   ipratropium (ATROVENT) 0.06 % nasal spray Place 2 sprays into both nostrils 2 (two) times daily.   ipratropium (ATROVENT) 0.06 % nasal spray Use 2 sprays each nostril bid prn  drainage   L-Lysine 1000 MG TABS Take by mouth. As needed   Lactobacillus (PROBIOTIC ACIDOPHILUS PO) Take by mouth daily. Digestive Advantage BC   bacillus coagulans   levocetirizine (XYZAL) 5 MG tablet TAKE 1 TABLET BY MOUTH EVERY DAY IN THE EVENING   LORazepam (ATIVAN) 1 MG tablet Take 1 tablet (1 mg total) by mouth at bedtime.   Lutein-Bilberry (BILBERRY PLUS LUTEIN PO) Take by mouth daily.   Melatonin 10 MG TABS Take 1 tablet by mouth at bedtime.    Moringa 500 MG CAPS Take 2 capsules by mouth daily.   Multiple Vitamins-Minerals (HAIR/SKIN/NAILS/BIOTIN) TABS Take by mouth daily.    Nutritional Supplements (NUTRITIONAL SUPPLEMENT PO) Take 2.1 g by mouth in the morning and at bedtime. LION'S MANE   nystatin-triamcinolone ointment (MYCOLOG) Apply 1 application topically 2 (two) times daily.   rizatriptan (MAXALT-MLT) 10 MG disintegrating tablet Take 1 tablet (10 mg total) by mouth as needed for migraine. May repeat in 2 hours if needed.  Max of 2 tablets in 24 hours, and 10 tablets per month   temazepam (RESTORIL) 15 MG capsule Take 1 capsule (15 mg total) by mouth at bedtime as needed for sleep.   timolol (BETIMOL) 0.5 % ophthalmic solution Place 1 drop into the right eye 2 (two) times daily.   tiZANidine (ZANAFLEX) 2 MG tablet TAKE UP TO 4 PILLS A DAY   Vitamin D, Ergocalciferol, (DRISDOL) 1.25 MG (50000 UNIT) CAPS capsule  ONE CAPSULE TWICE WEEKLY ON TUESDAYS/FRIDAYS   vitamin E 1000 UNIT capsule Take 1,000 Units by mouth daily.    zolpidem (AMBIEN) 10 MG tablet Take 10 mg by mouth at bedtime as needed. For sleep   No facility-administered encounter medications on file as of 10/21/2020.    Star Rating Drugs: None  SIG: Pattricia Boss, [employer] Pharmacist Assistant [phone]

## 2020-10-22 NOTE — Chronic Care Management (AMB) (Signed)
Chronic Care Management   CCM RN Visit Note  10/21/2020 Name: Grace [name] MRN: [id number] DOB: 1960/08/10  Subjective: Grace [name] is a 60 y.o. year old female who is a primary care patient of [name], MD. The care management team was consulted for assistance with disease management and care coordination needs.    Engaged with patient by telephone for follow up visit in response to provider referral for case management and/or care coordination services.   Consent to Services:  The patient was given information about Chronic Care Management services, agreed to services, and gave verbal consent prior to initiation of services.  Please see initial visit note for detailed documentation.   Patient agreed to services and verbal consent obtained.   Assessment: Review of patient past medical history, allergies, medications, health status, including review of consultants reports, laboratory and other test data, was performed as part of comprehensive evaluation and provision of chronic care management services.   SDOH (Social Determinants of Health) assessments and interventions performed:    CCM Care Plan  Allergies  Allergen Reactions   Lamictal [Lamotrigine] Shortness Of Breath, Itching and Swelling    Tongue swells   Mobic [Meloxicam] Anaphylaxis   Ultram [Tramadol Hcl] Anaphylaxis   Nefazodone Itching and Swelling    Other reaction(s): Arthralgia (Joint Pain); Throat swelling, mouth sores   Betaseron [Interferon Beta-1b] Other (See Comments)    Caused avascular necrosis   Codeine Itching   Copaxone [Glatiramer Acetate] Other (See Comments)    Altered mental status: mental changes, anxiety, motion sickness   Diclofenac Itching   Gabapentin Itching    Itching if she takes more than 300mg    Latex     Latex band-aids cause redness and tears your skin   Nitrofurantoin     Causing itching all over, tongue swelling, chest heaviness, swollen lips, cough, rapid  HR, swollen eyelids, red eyes/gums, lips red/white per pt.   Oxcarbazepine Itching and Other (See Comments)    Dizziness, throat hurts   Teriflunomide Diarrhea    Hair loss, joint pains, elevated liver enzymes abugio (joint pains and liver issues)   Trazodone And Nefazodone     Mouth sores, tongue swelling   Tape Rash    Outpatient Encounter Medications as of 10/21/2020  Medication Sig   Ascorbic Acid (VITAMIN C) 1000 MG tablet Take 1,000 mg by mouth daily.   aspirin EC 81 MG tablet Take 81 mg by mouth daily.   baclofen (LIORESAL) 10 MG tablet Take 2 tablets (20 mg total) by mouth 3 (three) times daily.   BLACK ELDERBERRY PO Take 300 mg by mouth in the morning and at bedtime.   Cholecalciferol (VITAMIN D) 50 MCG (2000 UT) CAPS Take 2,000 Units by mouth daily.    CINNAMON PO Take 1,200 mg by mouth daily.    diphenhydrAMINE (BENADRYL) 25 MG tablet Take 25 mg by mouth 3 (three) times daily. Takes with Hydrocodone   doxepin (SINEQUAN) 10 MG capsule TAKE 1 TO 2 CAPSULES AT BEDTIME   EPINEPHrine 0.3 mg/0.3 mL IJ SOAJ injection Use as directed for life-threatening allergic reaction. (Patient not taking: Reported on 10/16/2020)   etodolac (LODINE) 500 MG tablet Take 500 mg by mouth. 1-2 times a day   famotidine (PEPCID) 20 MG tablet TAKE 1 TABLET BY MOUTH TWICE A DAY   Fexofenadine-Pseudoephedrine (ALLEGRA-D PO) Take by mouth in the morning.   Flaxseed, Linseed, (FLAX SEED OIL) 1000 MG CAPS Take 1,200 mg by mouth daily.    gabapentin (  NEURONTIN) 300 MG capsule Take 1 capsule (300 mg total) by mouth 3 (three) times daily.   HYDROcodone-acetaminophen (NORCO/VICODIN) 5-325 MG tablet Take 1 tablet by mouth 3 (three) times daily as needed for moderate pain.   ipratropium (ATROVENT) 0.06 % nasal spray Place 2 sprays into both nostrils 2 (two) times daily.   ipratropium (ATROVENT) 0.06 % nasal spray Use 2 sprays each nostril bid prn drainage   L-Lysine 1000 MG TABS Take by mouth. As needed    Lactobacillus (PROBIOTIC ACIDOPHILUS PO) Take by mouth daily. Digestive Advantage BC   bacillus coagulans   levocetirizine (XYZAL) 5 MG tablet TAKE 1 TABLET BY MOUTH EVERY DAY IN THE EVENING   LORazepam (ATIVAN) 1 MG tablet Take 1 tablet (1 mg total) by mouth at bedtime.   Lutein-Bilberry (BILBERRY PLUS LUTEIN PO) Take by mouth daily.   Melatonin 10 MG TABS Take 1 tablet by mouth at bedtime.    Moringa 500 MG CAPS Take 2 capsules by mouth daily.   Multiple Vitamins-Minerals (HAIR/SKIN/NAILS/BIOTIN) TABS Take by mouth daily.    Nutritional Supplements (NUTRITIONAL SUPPLEMENT PO) Take 2.1 g by mouth in the morning and at bedtime. LION'S MANE   nystatin-triamcinolone ointment (MYCOLOG) Apply 1 application topically 2 (two) times daily.   rizatriptan (MAXALT-MLT) 10 MG disintegrating tablet Take 1 tablet (10 mg total) by mouth as needed for migraine. May repeat in 2 hours if needed.  Max of 2 tablets in 24 hours, and 10 tablets per month   temazepam (RESTORIL) 15 MG capsule Take 1 capsule (15 mg total) by mouth at bedtime as needed for sleep.   timolol (BETIMOL) 0.5 % ophthalmic solution Place 1 drop into the right eye 2 (two) times daily.   tiZANidine (ZANAFLEX) 2 MG tablet TAKE UP TO 4 PILLS A DAY   Vitamin D, Ergocalciferol, (DRISDOL) 1.25 MG (50000 UNIT) CAPS capsule ONE CAPSULE TWICE WEEKLY ON TUESDAYS/FRIDAYS   vitamin E 1000 UNIT capsule Take 1,000 Units by mouth daily.    zolpidem (AMBIEN) 10 MG tablet Take 10 mg by mouth at bedtime as needed. For sleep   No facility-administered encounter medications on file as of 10/21/2020.    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   Decreased pedal pulses 06/30/2016   Pain in both lower extremities 06/30/2016   Facial pain, atypical 06/08/2016   Fibroids 02/12/2016   Chest pain 07/07/2015   Dizziness and giddiness 07/07/2015   Leiomyoma of uterus 07/07/2015   Lower abdominal pain 07/07/2015   Hip pain 03/03/2015   Optic neuritis 06/27/2014   Multiple sclerosis ([place]) 06/26/2014   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/26/2014   Other fatigue 06/26/2014   Dysesthesia 06/26/2014   Depression with anxiety 06/26/2014   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   Insomnia 06/26/2014   Low serum vitamin D 06/26/2014    Conditions to be addressed/monitored: MS, Vitamin D Deficiency, Recurrent major depression, Class 1 Obesity due to excess calories, Prediabetes  Care Plan : Multiple  Sclerosis (Adult)  Updates made by Lynne Logan, RN since 10/21/2020 12:00 AM     Problem: Mobility and Independence      Long-Range Goal: Mobility and Independence Optimized   Start Date: 04/21/2020  Expected End Date: 04/21/2021  Recent Progress: On track  Priority: High  Note:   Current Barriers:  Ineffective Self Health Maintenance Currently UNABLE TO independently self manage needs related to chronic health conditions.  Knowledge Deficits related to short term plan for care coordination needs and long term plans for chronic disease management needs Nurse Case Manager Clinical Goal(s):  Patient will work with care management team to address care coordination and chronic disease management needs related to Disease Management Educational Needs Care Coordination Medication Management and Education Psychosocial Support   Interventions:  10/21/20 completed successful outbound call with patient  Collaboration with [name], MD  regarding development and update of comprehensive plan of care as evidenced by provider attestation and co-signature Inter-disciplinary care team collaboration (see longitudinal plan of care) Assessed for patient adherence to MD recommendations for RRMS  Review of patient status, including review of consultant's reports, relevant laboratory and other test results, and medications completed. Reviewed medications with patient and discussed importance of medication adherence Assessed for patient ability to perform activities of daily living and required amount of assistance needed for safety, optimal independence and self-care. Assessed for barriers to participation in therapy or exercise such as pain with activity, anticipated or imagined pain, transportation, depression or fear.  Reviewed and discussed next MS follow up with Dr. Felecia Shelling is scheduled for 12/17/20 @4 :00 pm  Discussed plans with patient for ongoing care management follow up and provided patient with direct contact information for care management team Self-Care Activities:   - set a daily activity goal - start slowly and increase the amount of time every week - keep all scheduled MD f/u with Neurologist - report early signs or symptoms of exacerbation promptly to MD - avoid trigger known to trigger MS exacerbation   Follow Up Plan: Telephone follow up appointment with care management team member scheduled for:  01/21/21     Problem: Symptom Relapse      Long-Range Goal: Symptom Relapse Prevented or Managed   Start Date: 04/21/2020  Expected End Date: 04/21/2021  Recent Progress: On track  Priority: High  Note:   .Current Barriers:  Ineffective Self Health Maintenance Currently UNABLE TO independently self manage needs related to chronic health conditions.  Knowledge Deficits related to short term plan for care coordination needs and long term plans for chronic disease management needs Nurse Case Manager Clinical Goal(s):   Patient will work with care management team to address care coordination and chronic disease management needs related to Disease Management Educational Needs Care Coordination Medication Management and Education Psychosocial Support   Interventions:  10/21/20 completed successful call with patient  Collaboration with [name], MD regarding development and update of comprehensive plan of care as evidenced by provider attestation and co-signature Inter-disciplinary care team collaboration (see longitudinal plan of care) Provided education to patient about basic disease process related to relapsing remitting Multiple Sclerosis  Review of patient status, including review of consultant's reports, relevant laboratory and other test results, and medications completed. Reviewed medications with patient and discussed importance of medication adherence Assessed for signs of disease exacerbation, patient denies; Assessed for effective pain management  Discussed next MS follow up with Neurologist, Dr. Felecia Shelling is scheduled for 12/17/20 @4 :00 PM  Discussed plans with patient for ongoing care  management follow up and provided patient with direct contact information for care management team Patient Goals/Self Care Activities - call for prescription refill 3 days before needed - plan exercise or activity when pain is best controlled - prioritize tasks for the day  Follow Up Plan: Telephone follow up appointment with care management team member scheduled for: 01/21/21    Care Plan : Obesity (Adult)  Updates made by Lynne Logan, RN since 10/21/2020 12:00 AM     Problem: Readiness for Weight Management   Priority: High     Long-Range Goal: Plan for Weight Management Developed   Start Date: 04/21/2020  Expected End Date: 04/21/2021  Recent Progress: On track  Priority: High  Note:   Current Barriers:  Knowledge Deficits related to weight loss strategies as evidenced by weight gain  reported/observed and/or altered eating habits Chronic Disease Management support and education needs related to weight management in patient with HLD and Glaucoma, Vitamin d deficiency, sleep disturbance, Obesity, MS Nurse Case Manager Clinical Goal(s):  Patient will demonstrate improved health management independence as evidenced by adhering to provider prescribed dietary guidelines (specify), increasing daily water intake (patient does not have provider prescribed fluid restriction), and verbalizing understanding of specific eating plan Interventions:  Evaluation of current treatment plan related to weight loss and patient's adherence to plan as established by provider Assessed patient perspective on healthy weight, weight loss, motivation, and readiness for weight loss; review current dietary intake and exercise levels.  Determined patient is currently grieving over the loss of her husband of 90 years  Determined patient will continue to participate in the weight loss program when ready  Discussed plans with patient for ongoing care management follow up and provided patient with direct contact information for care management team Patient Goals/Self-Care Activities Patient will self administer medications as prescribed Patient will attend all scheduled provider appointments Patient will call pharmacy for medication refills Patient will continue to perform ADL's independently Patient will continue to perform IADL's independently Patient will call provider office for new concerns or questions - set my weight loss goal - drink 6 to 8 glasses of water each day - fill half the plate with nonstarchy vegetables - keep a food diary  Follow Up Plan: Telephone follow up appointment with care management team member scheduled for: 01/21/21    Care Plan : Glaucoma  Updates made by Lynne Logan, RN since 10/21/2020 12:00 AM     Problem: Glaucoma management   Priority: High     Long-Range Goal:  Disease Progression Prevented or Minimized   Start Date: 04/21/2020  Expected End Date: 04/21/2021  Recent Progress: On track  Priority: High  Note:   Current Barriers:  Ineffective Self Health Maintenance Currently UNABLE TO independently self manage needs related to chronic health conditions.  Knowledge Deficits related to short term plan for care coordination needs and long term plans for chronic disease management needs Nurse Case Manager Clinical Goal(s):  Patient will work with care management team to address care coordination and chronic disease management needs related to Disease Management Educational Needs Care Coordination Medication Management and Education   Interventions:  Collaboration with [name], MD regarding development and update of comprehensive plan of care as evidenced by provider attestation and co-signature Inter-disciplinary care team collaboration (see longitudinal plan of care) Determined patient continues to follow Ophthalmologist, Dr. Edilia Bo MD for care of her Secondary open-angle glaucoma of right eye Determined patient completed her 3 month follow up on 10/20/20 with the  following recommendations reviewed:   3 month pressure check. Patient has noticed vision is a Geniva Lohnes down. Was given a glass RX; return on 12/04/20  Use timolol (TIMOPTIC) 0.5 % ophthalmic solution   Place 1 drop into the right eye 2 times daily.   Review of patient status, including review of consultant's reports, relevant laboratory and other test results, and medications completed. Reviewed medications with patient and discussed importance of medication adherence Patient Goals/Self Care Activities:      -keep all scheduled appointments with Eye Specialist -follow eye care as directed by Eye Specialist -report new or worsening symptoms or sudden change in vision   Follow Up Plan: Telephone follow up appointment with care management team member scheduled for: 01/21/21    Care Plan :  General Plan of Care (Adult)  Updates made by Lynne Logan, RN since 10/21/2020 12:00 AM  Completed 10/22/2020   Problem: Caregiver Sress Resolved 10/21/2020  Priority: High     Long-Range Goal: Caregiver Stress Managed Completed 10/21/2020  Start Date: 06/30/2020  Expected End Date: 12/29/2020  Recent Progress: On track  Priority: High  Note:   Current Barriers:  Ineffective Self Health Maintenance Lacks social connections Currently UNABLE TO independently self manage needs related to chronic health conditions.  Knowledge Deficits related to short term plan for care coordination needs and long term plans for chronic disease management needs Clinical Goal(s):  Collaboration with [name], MD regarding development and update of comprehensive plan of care as evidenced by provider attestation and co-signature Inter-disciplinary care team collaboration (see longitudinal plan of care) Over the next 180 days, patient will work with care management team to address care coordination and chronic disease management needs related to Educational Needs Care Coordination Psychosocial Support Caregiver Stress support   Interventions:  10/21/20 completed successful outbound call with patient  Evaluation of current treatment plan related to Caregiver Stress/Burnout self-management and patient's adherence to plan as established by provider. Collaboration with [name], MD regarding development and update of comprehensive plan of care as evidenced by provider attestation       and co-signature Inter-disciplinary care team collaboration (see longitudinal plan of care) Determined patient's spouse passed away in early 2022-09-19, no further caregiver needs identified at this time  Discussed plans with patient for ongoing care management follow up and provided patient with direct contact information for care management team Patient Goals/Self Care Activities:  Over the next 180 days, patient will:   Work with the embedded CCM team for Care Coordination needs, Education administrator Stress Support  Notify PCP and or CCM team of new or worsening symptoms of anxiety and or depression   Follow Up Plan: No further follow up needed      Plan:Telephone follow up appointment with care management team member scheduled for:  01/21/21  Barb Merino, RN, BSN, CCM Care Management Coordinator [facility]  Direct Phone: [phone]

## 2020-10-22 NOTE — Patient Instructions (Signed)
Goals Addressed      Achieve a Healthy Weight-Obesity   On track    Timeframe:  Long-Range Goal Priority:  High Start Date:  04/21/20                           Expected End Date:  04/21/21                    Follow Up Date: 01/21/21   - drink 6 to 8 glasses of water each day - fill half the plate with nonstarchy vegetables - keep a food diary  - keep all appointments with Nutritionist as scheduled - follow diet plan per Nutritionist recommendations    Why is this important?   When you are ready to manage your weight, have a plan and have set a goal, it is time to take action.  Taking small steps to change how you eat and exercise is a good place to start.    Notes:       COMPLETED: Caregiver Stress minimized       Timeframe:  Long-Range Goal Priority:  High Start Date: 06/30/20                            Expected End Date: 12/29/20    Next Follow Up Date: 08/25/20  Patient Goals/Self Care Activities:        Work with the embedded CCM team for Care Coordination needs, Psychocial Support and [place]  Notify PCP and or CCM team of new or worsening symptoms of anxiety and or depression                     Keep Pain Under Control-Multiople Sclerosis   On track    Timeframe:  Long-Range Goal Priority:  High Start Date:  04/21/20                           Expected End Date:  04/21/21                    Follow Up Date: 01/21/21    - call for prescription refill 3 days before needed - plan exercise or activity when pain is best controlled - prioritize tasks for the day    Why is this important?   Day-to-day life can be hard when your loved one/you have pain.  Even a small change in emotion or a physical problem can make pain better or worse.  Coping with pain depends on how the mind and body reacts to pain.  Pain medicine is just one piece of the treatment puzzle.  There are many tools to help manage pain. A combination of them can be used to best meet your  loved one's/your needs.     Notes:       Maintain My Strength-Multiple Sclerosis   On track    Timeframe:  Long-Range Goal Priority:  High Start Date: 04/21/20                            Expected End Date: 04/21/21                     Follow Up Date: 01/21/21   - set a daily activity goal - start slowly and increase the amount of  time every week  - keep all scheduled MD f/u with Neurologist - report early signs or symptoms of exacerbation promptly to MD - avoid trigger known to trigger MS exacerbation    Why is this important?   Walking and easy exercises make your loved one/you stronger. They also increase energy.  Every Rudransh Bellanca bit helps, at any age.    Notes:       Manage Glaucoma   On track    Timeframe:  Long-Range Goal Priority:  High Start Date:  04/21/20                           Expected End Date:  04/21/21       Follow up date: 01/21/21   -keep all scheduled appointments with Eye Specialist -follow eye care as directed by Eye Specialist -report new or worsening symptoms or sudden change in vision                 Set My Weight Loss Goal       Timeframe:  Long-Range Goal Priority:  High Start Date:  04/21/20                           Expected End Date:  04/21/21                     Follow Up Date: 01/21/21   - set weight loss goal    Why is this important?   Losing only 5 to 15 percent of your weight makes a big difference in your health.    Notes:

## 2020-10-28 ENCOUNTER — Encounter: Payer: Self-pay | Admitting: Internal Medicine

## 2020-10-28 ENCOUNTER — Other Ambulatory Visit: Payer: Self-pay | Admitting: *Deleted

## 2020-10-28 MED ORDER — HYDROCODONE-ACETAMINOPHEN 5-325 MG PO TABS: 1.0000 | ORAL_TABLET | Freq: Three times a day (TID) | ORAL | 0 refills | Status: DC | PRN

## 2020-11-04 ENCOUNTER — Other Ambulatory Visit: Payer: Self-pay | Admitting: Neurology

## 2020-11-13 ENCOUNTER — Other Ambulatory Visit: Payer: Self-pay

## 2020-11-13 ENCOUNTER — Ambulatory Visit (INDEPENDENT_AMBULATORY_CARE_PROVIDER_SITE_OTHER): Payer: Medicare Other | Admitting: Internal Medicine

## 2020-11-13 ENCOUNTER — Encounter: Payer: Self-pay | Admitting: Internal Medicine

## 2020-11-13 VITALS — BP 128/76 | HR 83 | Temp 98.2°F | Ht 66.0 in | Wt 211.2 lb

## 2020-11-13 DIAGNOSIS — Z87892 Personal history of anaphylaxis: Secondary | ICD-10-CM

## 2020-11-13 DIAGNOSIS — M7662 Achilles tendinitis, left leg: Secondary | ICD-10-CM | POA: Diagnosis not present

## 2020-11-13 NOTE — Patient Instructions (Signed)
[employer]'s Emergency Medicine: Concepts and Clinical Practice (9th ed., pp. 2423-5361). [place], [place]: [place]. Retrieved from https://www.clinicalkey.com/#!/content/[email or web address]  Achilles Tendinitis  Achilles tendinitis is inflammation of the tough, cord-like band that attaches the lower leg muscles to the heel bone (Achilles tendon). This is usually caused by overusing the tendon and the ankle joint. Achilles tendinitis usually gets better over time with treatment and caring foryourself at home. It can take weeks or months to heal completely. What are the causes? This condition may be caused by: A sudden increase in exercise or activity, such as running. Doing the same exercises or activities, such as jumping, over and over. Not warming up calf muscles before exercising. Exercising in shoes that are worn out or not made for exercise. Having arthritis or a bone growth (spur) on the back of the heel bone. This can rub against the tendon and hurt it. Age-related wear and tear. Tendons become less flexible with age and are more likely to be injured. What are the signs or symptoms? Common symptoms of this condition include: Pain in the Achilles tendon or in the back of the leg, just above the heel. The pain usually gets worse with exercise. Stiffness or soreness in the back of the leg, especially in the morning. Swelling of the skin over the Achilles tendon. Thickening of the tendon. Trouble standing on tiptoe. How is this diagnosed? This condition is diagnosed based on your symptoms and a physical exam. You may have tests, including: X-rays. MRI. How is this treated? The goal of treatment is to relieve symptoms and help your injury heal. Treatment may include: Decreasing or stopping activities that caused the tendinitis. This may mean switching to low-impact exercises like biking or swimming. Icing the injured area. Doing physical therapy,  including strengthening and stretching exercises. Taking NSAIDs, such as ibuprofen, to help relieve pain and swelling. Using supportive shoes, wraps, heel lifts, or a walking boot (air cast). Having surgery. This may be done if your symptoms do not improve after other treatments. Using high-energy shock wave impulses to stimulate the healing process (extracorporeal shock wave therapy). This is rare. Having an injection of medicines that help relieve inflammation (corticosteroids). This is rare. Follow these instructions at home: If you have an air cast: Wear the air cast as told by your health care provider. Remove it only as told by your health care provider. Loosen it if your toes tingle, become numb, or turn cold and blue. Keep it clean. If the air cast is not waterproof: Do not let it get wet. Cover it with a watertight covering when you take a bath or shower. Managing pain, stiffness, and swelling  If directed, put ice on the injured area. To do this: If you have a removable air cast, remove it as told by your health care provider. Put ice in a plastic bag. Place a towel between your skin and the bag. Leave the ice on for 20 minutes, 2-3 times a day. Move your toes often to reduce stiffness and swelling. Raise (elevate) your foot above the level of your heart while you are sitting or lying down.  Activity Gradually return to your normal activities as told by your health care provider. Ask your health care provider what activities are safe for you. Do not do activities that cause pain. Consider doing low-impact exercises, like cycling or swimming. Ask your health care provider when it is safe to drive if you have an air cast on your foot. If physical therapy was  prescribed, do exercises as told by your health care provider or physical therapist. General instructions If directed, wrap your foot with an elastic bandage or other wrap. This can help to keep your tendon from moving too  much while it heals. Your health care provider will show you how to wrap your foot correctly. Wear supportive shoes or heel lifts only as told by your health care provider. Take over-the-counter and prescription medicines only as told by your health care provider. Keep all follow-up visits as told by your health care provider. This is important. Contact a health care provider if you: Have symptoms that get worse. Have pain that does not get better with medicine. Develop new, unexplained symptoms. Develop warmth and swelling in your foot. Have a fever. Get help right away if you: Have a sudden popping sound or sensation in your Achilles tendon followed by severe pain. Cannot move your toes or foot. Cannot put any weight on your foot. Your foot or toes become numb and look white or blue even after loosening your bandage or air cast. Summary Achilles tendinitis is inflammation of the tough, cord-like band that attaches the lower leg muscles to the heel bone (Achilles tendon). This condition is usually caused by overusing the tendon and the ankle joint. It can also be caused by arthritis or normal aging. The most common symptoms of this condition include pain, swelling, or stiffness in the Achilles tendon or in the back of the leg. This condition is usually treated by decreasing or stopping activities that caused the tendinitis, icing the injured area, taking NSAIDs, and doing physical therapy. This information is not intended to replace advice given to you by your health care provider. Make sure you discuss any questions you have with your healthcare provider. Document Revised: 09/11/2018 Document Reviewed: 09/11/2018 Elsevier Patient Education  [place].

## 2020-11-13 NOTE — Progress Notes (Signed)
I,[name],acting as a Education administrator for [name], MD.,have documented all relevant documentation on the behalf of [name], MD,as directed by  [name], MD while in the presence of [name], MD.  This visit occurred during the SARS-CoV-2 public health emergency.  Safety protocols were in place, including screening questions prior to the visit, additional usage of staff PPE, and extensive cleaning of exam room while observing appropriate contact time as indicated for disinfecting solutions.  Subjective:     Patient ID: Grace [name] , female    DOB: [birth year] , 60 y.o.   MRN: [id number]   Chief Complaint  Patient presents with  . Foot Pain    Heal area left foot    HPI  The patient is here today for evaluation of left heel pain. She denies fall/trauma. There is pain with ambulation. States back of her foot is where it hurts the most. Her feet do not hurt when she first gets out of the bed in the morning. She has no prior h/o plantar fasciitis.  Foot Pain This is a new problem. The current episode started more than 1 month ago. Pertinent negatives include no anorexia, arthralgias, fever or headaches.    Past Medical History:  Diagnosis Date  . Allergy   . Anxiety   . Avascular necrosis ([place])   . Back pain   . Cancer ([place])   . Chest pain   . Chewing difficulty   . Chronic fatigue syndrome   . Colon polyp    pre-cancerous  . Complication of anesthesia    patient states she requires more anesthesia  . Depression   . Disorder of soft tissue   . Diverticulosis   . Dizziness   . Edema, lower extremity   . Fatty liver   . Fibromyalgia   . GERD (gastroesophageal reflux disease)   . H/O breast biopsy    pre- cancerous cells  . Hand fracture, left    9/87  . Heart palpitations   . Heart valve disease   . History of Holter monitoring   . Hyperlipidemia   . Insomnia   . Joint pain   . Lactose intolerance   . Lhermitte's syndrome   .  Migraines   . Multiple food allergies   . Multiple sclerosis ([place])    dx in 11/1996  . Multiple sclerosis ([place])   . Neuromuscular disorder ([place])   . Neuropathy   . Numbness and tingling in hands   . Obesity   . Optic neuritis   . OSA (obstructive sleep apnea)    no longer have OSA since stopped taking a MS medication  . Osteoarthritis   . Otitis media   . Palpitations   . Prediabetes   . Raynaud's phenomenon   . Shortness of breath   . Sleep apnea    Due to medication that she no longer takes.  . Swallowing difficulty   . Vaginitis and vulvovaginitis   . Vitamin B12 deficiency   . Vitamin D deficiency      Family History  Problem Relation Age of Onset  . Pancreatic cancer Mother   . Stroke Mother   . Hypertension Mother   . Hyperlipidemia Mother   . Heart disease Mother   . Cancer Mother   . Depression Mother   . Anxiety disorder Mother   . Alcoholism Mother   . Testicular cancer Brother   . Colon cancer Maternal Uncle   . Pancreatic cancer  Paternal Uncle   . Prostate cancer Maternal Grandfather   . Colon polyps Father   . Diabetes Father   . Hypertension Father   . Hyperlipidemia Father   . Kidney disease Father   . Sleep apnea Father   . Obesity Father      Current Outpatient Medications:  .  Ascorbic Acid (VITAMIN C) 1000 MG tablet, Take 1,000 mg by mouth daily., Disp: , Rfl:  .  aspirin EC 81 MG tablet, Take 81 mg by mouth daily., Disp: , Rfl:  .  baclofen (LIORESAL) 10 MG tablet, Take 2 tablets (20 mg total) by mouth 3 (three) times daily., Disp: 180 tablet, Rfl: 11 .  BLACK ELDERBERRY PO, Take 300 mg by mouth in the morning and at bedtime., Disp: , Rfl:  .  Cholecalciferol (VITAMIN D) 50 MCG (2000 UT) CAPS, Take 2,000 Units by mouth daily. , Disp: , Rfl:  .  CINNAMON PO, Take 1,200 mg by mouth daily. , Disp: , Rfl:  .  diphenhydrAMINE (BENADRYL) 25 MG tablet, Take 25 mg by mouth 3 (three) times daily. Takes with Hydrocodone, Disp: , Rfl:  .  doxepin  (SINEQUAN) 10 MG capsule, TAKE 1 TO 2 CAPSULES AT BEDTIME, Disp: 180 capsule, Rfl: 2 .  etodolac (LODINE) 500 MG tablet, Take 500 mg by mouth. 1-2 times a day, Disp: , Rfl:  .  famotidine (PEPCID) 20 MG tablet, TAKE 1 TABLET BY MOUTH TWICE A DAY, Disp: 180 tablet, Rfl: 1 .  Fexofenadine-Pseudoephedrine (ALLEGRA-D PO), Take by mouth in the morning., Disp: , Rfl:  .  Flaxseed, Linseed, (FLAX SEED OIL) 1000 MG CAPS, Take 1,200 mg by mouth daily. , Disp: , Rfl:  .  gabapentin (NEURONTIN) 300 MG capsule, Take 1 capsule (300 mg total) by mouth 3 (three) times daily., Disp: 90 capsule, Rfl: 11 .  HYDROcodone-acetaminophen (NORCO/VICODIN) 5-325 MG tablet, Take 1 tablet by mouth 3 (three) times daily as needed for moderate pain., Disp: 90 tablet, Rfl: 0 .  ipratropium (ATROVENT) 0.06 % nasal spray, Place 2 sprays into both nostrils 2 (two) times daily., Disp: , Rfl:  .  ipratropium (ATROVENT) 0.06 % nasal spray, Use 2 sprays each nostril bid prn drainage, Disp: 30 mL, Rfl: 5 .  L-Lysine 1000 MG TABS, Take by mouth. As needed, Disp: , Rfl:  .  Lactobacillus (PROBIOTIC ACIDOPHILUS PO), Take by mouth daily. Digestive Advantage BC   bacillus coagulans, Disp: , Rfl:  .  levocetirizine (XYZAL) 5 MG tablet, TAKE 1 TABLET BY MOUTH EVERY DAY IN THE EVENING, Disp: 90 tablet, Rfl: 2 .  LORazepam (ATIVAN) 1 MG tablet, Take 1 tablet (1 mg total) by mouth at bedtime., Disp: 30 tablet, Rfl: 5 .  Lutein-Bilberry (BILBERRY PLUS LUTEIN PO), Take by mouth daily., Disp: , Rfl:  .  Melatonin 10 MG TABS, Take 1 tablet by mouth at bedtime. , Disp: , Rfl:  .  Moringa 500 MG CAPS, Take 2 capsules by mouth daily., Disp: , Rfl:  .  Multiple Vitamins-Minerals (HAIR/SKIN/NAILS/BIOTIN) TABS, Take by mouth daily. , Disp: , Rfl:  .  Nutritional Supplements (NUTRITIONAL SUPPLEMENT PO), Take 2.1 g by mouth in the morning and at bedtime. LION'S MANE, Disp: , Rfl:  .  nystatin-triamcinolone ointment (MYCOLOG), Apply 1 application topically 2  (two) times daily., Disp: 30 g, Rfl: 2 .  rizatriptan (MAXALT-MLT) 10 MG disintegrating tablet, Take 1 tablet (10 mg total) by mouth as needed for migraine. May repeat in 2 hours if needed.  Max  of 2 tablets in 24 hours, and 10 tablets per month, Disp: 10 tablet, Rfl: 11 .  temazepam (RESTORIL) 15 MG capsule, Take 1 capsule (15 mg total) by mouth at bedtime as needed for sleep., Disp: 30 capsule, Rfl: 2 .  timolol (BETIMOL) 0.5 % ophthalmic solution, Place 1 drop into the right eye 2 (two) times daily., Disp: , Rfl:  .  tiZANidine (ZANAFLEX) 2 MG tablet, TAKE UP TO 4 PILLS A DAY, Disp: 120 tablet, Rfl: 1 .  Vitamin D, Ergocalciferol, (DRISDOL) 1.25 MG (50000 UNIT) CAPS capsule, ONE CAPSULE TWICE WEEKLY ON TUESDAYS/FRIDAYS, Disp: 24 capsule, Rfl: 2 .  vitamin E 1000 UNIT capsule, Take 1,000 Units by mouth daily. , Disp: , Rfl:  .  zolpidem (AMBIEN) 10 MG tablet, Take 10 mg by mouth at bedtime as needed. For sleep, Disp: , Rfl:  .  EPINEPHrine 0.3 mg/0.3 mL IJ SOAJ injection, Use as directed for life-threatening allergic reaction. (Patient not taking: No sig reported), Disp: 1 each, Rfl: 3   Allergies  Allergen Reactions  . Diclofenac Anaphylaxis  . Lamictal [Lamotrigine] Shortness Of Breath, Itching and Swelling    Tongue swells  . Mobic [Meloxicam] Anaphylaxis  . Ultram [Tramadol Hcl] Anaphylaxis  . Nefazodone Itching and Swelling    Other reaction(s): Arthralgia (Joint Pain); Throat swelling, mouth sores  . Betaseron [Interferon Beta-1b] Other (See Comments)    Caused avascular necrosis  . Codeine Itching  . Copaxone [Glatiramer Acetate] Other (See Comments)    Altered mental status: mental changes, anxiety, motion sickness  . Gabapentin Itching    Itching if she takes more than 300mg   . Latex     Latex band-aids cause redness and tears your skin  . Nitrofurantoin     Causing itching all over, tongue swelling, chest heaviness, swollen lips, cough, rapid HR, swollen eyelids, red  eyes/gums, lips red/white per pt.  . Oxcarbazepine Itching and Other (See Comments)    Dizziness, throat hurts  . Teriflunomide Diarrhea    Hair loss, joint pains, elevated liver enzymes abugio (joint pains and liver issues)  . Trazodone And Nefazodone     Mouth sores, tongue swelling  . Tape Rash     Review of Systems  Constitutional: Negative.  Negative for fever.  Respiratory: Negative.    Cardiovascular: Negative.   Gastrointestinal: Negative.  Negative for anorexia.  Musculoskeletal:  Negative for arthralgias.       Left heal pain  Neurological:  Negative for headaches.  Psychiatric/Behavioral: Negative.    All other systems reviewed and are negative.   Today's Vitals   11/13/20 1417  BP: 128/76  Pulse: 83  Temp: 98.2 F (36.8 C)  TempSrc: Oral  Weight: 211 lb 3.2 oz (95.8 kg)  Height: 5\' 6"  (1.676 m)  PainSc: 6   PainLoc: Foot   Body mass index is 34.09 kg/m.  Wt Readings from Last 3 Encounters:  11/13/20 211 lb 3.2 oz (95.8 kg)  10/16/20 212 lb (96.2 kg)  10/16/20 212 lb 6.4 oz (96.3 kg)    BP Readings from Last 3 Encounters:  11/13/20 128/76  10/16/20 126/80  10/16/20 126/80    Objective:  Physical Exam Vitals and nursing note reviewed.  Constitutional:      Appearance: Normal appearance.  HENT:     Head: Normocephalic and atraumatic.     Nose:     Comments: Masked     Mouth/Throat:     Comments: Masked  Cardiovascular:     Rate and Rhythm:  Normal rate and regular rhythm.     Heart sounds: Normal heart sounds.  Pulmonary:     Effort: Pulmonary effort is normal.     Breath sounds: Normal breath sounds.  Musculoskeletal:     Cervical back: Normal range of motion.     Comments: Left achilles tendon is tender to palpation. No overlying erythema.   Skin:    General: Skin is warm.  Neurological:     General: No focal deficit present.     Mental Status: She is alert.  Psychiatric:        Mood and Affect: Mood normal.        Behavior: Behavior  normal.        Assessment And Plan:     1. Achilles tendinitis of left lower extremity Comments: She agrees to Podiatry referral. In the meantime, advised to apply OTc pain cream to affected area twice daily prn. She was also given stretching exercises to perform daily.  - Ambulatory referral to Podiatry  2. History of anaphylaxis Comments: We reviewed paperwork for PA Epi-pen. I will refer her to [facility] to help with this.  She is in agreement with her treatment plan.   Patient was given opportunity to ask questions. Patient verbalized understanding of the plan and was able to repeat key elements of the plan. All questions were answered to their satisfaction.   I, [name], MD, have reviewed all documentation for this visit. The documentation on 11/17/20 for the exam, diagnosis, procedures, and orders are all accurate and complete.   IF YOU HAVE BEEN REFERRED TO A SPECIALIST, IT MAY TAKE 1-2 WEEKS TO SCHEDULE/PROCESS THE REFERRAL. IF YOU HAVE NOT HEARD FROM US/SPECIALIST IN TWO WEEKS, PLEASE GIVE [place] A CALL AT [phone] X [phone].   THE PATIENT IS ENCOURAGED TO PRACTICE SOCIAL DISTANCING DUE TO THE COVID-19 PANDEMIC.

## 2020-11-19 ENCOUNTER — Telehealth: Payer: Self-pay

## 2020-11-19 NOTE — Chronic Care Management (AMB) (Signed)
   No answer, left message of telephone appointment with [name] CPP on 11-20-2020 at 11:30.Left message to have all medications, supplements, blood pressure and/or blood sugar logs available during appointment and to return call if need to reschedule.   [place] Pharmacist Assistant [phone]

## 2020-11-20 ENCOUNTER — Ambulatory Visit (INDEPENDENT_AMBULATORY_CARE_PROVIDER_SITE_OTHER): Payer: Medicare Other

## 2020-11-20 DIAGNOSIS — F418 Other specified anxiety disorders: Secondary | ICD-10-CM

## 2020-11-20 DIAGNOSIS — Z0271 Encounter for disability determination: Secondary | ICD-10-CM

## 2020-11-20 DIAGNOSIS — E559 Vitamin D deficiency, unspecified: Secondary | ICD-10-CM

## 2020-11-20 NOTE — Progress Notes (Signed)
Chronic Care Management Pharmacy Note  11/26/2020 Name:  Grace [name] MRN:  [id number] DOB:  Jan 24, 1961  Summary: Patient reports she would like follow up on the cost of the Epi-Pen, prescribed by the allergist but is not affordable. She also has a question in regards to vaccinations.   Recommendations/Changes made from today's visit: Recommended patient decrease the amount of vitamins and herbal remedies she is taking.  Recommend that patient have vaccinations completed   Plan: Patient reports that she will review her vitamins.  Patients medication assistance pending for Epi-Pen   Subjective: Grace [name] is an 60 y.o. year old female who is a primary patient of [name], MD.  The CCM team was consulted for assistance with disease management and care coordination needs.    Engaged with patient by telephone for follow up visit in response to provider referral for pharmacy case management and/or care coordination services. Patient reports that she is concerned about the cost of her tdap shot. She reports that there were some issues with filing insurance for her Tetanus shot. Patient has a 58 year old [place], and Verdis Frederickson who is an Therapist, art.  Consent to Services:  The patient was given information about Chronic Care Management services, agreed to services, and gave verbal consent prior to initiation of services.  Please see initial visit note for detailed documentation.   Patient Care Team: [name], MD as PCP - General (Internal Medicine) Felecia Shelling, Nanine Means, MD (Neurology) Cyril Mourning, [facility] (Inactive) (Pharmacist) Daneen Schick as Social Worker Mayford Knife, [facility] (Pharmacist)  Recent office visits: 11/13/2020 PCP Office visit  Recent consult visits: 10/20/2020 Ophthalmology visit   Hospital visits: None in previous 6 months   Objective:  Lab Results  Component Value Date   CREATININE 0.71 04/16/2020   BUN 15  04/16/2020   GFRNONAA 96 04/16/2020   GFRAA 110 04/16/2020   NA 137 04/16/2020   K 4.4 04/16/2020   CALCIUM 10.0 04/16/2020   CO2 23 04/16/2020   GLUCOSE 84 04/16/2020    Lab Results  Component Value Date/Time   HGBA1C 5.6 04/16/2020 04:35 PM   HGBA1C 5.9 (H) 11/22/2019 05:10 PM    Last diabetic Eye exam: No results found for: HMDIABEYEEXA  Last diabetic Foot exam: No results found for: HMDIABFOOTEX   Lab Results  Component Value Date   CHOL 205 (H) 11/22/2019   HDL 108 11/22/2019   LDLCALC 71 11/22/2019   TRIG 161 (H) 11/22/2019   CHOLHDL 1.9 11/22/2019    Hepatic Function Latest Ref Rng & Units 04/16/2020 10/10/2019 01/03/2019  Total Protein 6.0 - 8.5 g/dL 7.6 6.7 6.9  Albumin 3.8 - 4.9 g/dL 4.7 4.4 4.5  AST 0 - 40 IU/L 20 32 29  ALT 0 - 32 IU/L 24 31 29   Alk Phosphatase 44 - 121 IU/L 111 104 104  Total Bilirubin 0.0 - 1.2 mg/dL 0.5 0.6 0.7  Bilirubin, Direct 0.00 - 0.40 mg/dL - - -    Lab Results  Component Value Date/Time   TSH 1.500 11/22/2019 05:10 PM   TSH 1.930 04/27/2018 12:08 PM   FREET4 1.46 11/22/2019 05:10 PM   FREET4 0.89 05/23/2018 03:29 PM    CBC Latest Ref Rng & Units 11/22/2019 01/03/2019 04/27/2018  WBC 3.4 - 10.8 x10E3/uL 10.3 6.9 7.5  Hemoglobin 11.1 - 15.9 g/dL 13.7 13.6 13.6  Hematocrit 34.0 - 46.6 % 40.3 39.9 38.8  Platelets 150 - 450 x10E3/uL 275 368 327  Lab Results  Component Value Date/Time   VD25OH 42.5 04/16/2020 04:35 PM   VD25OH 35.5 10/10/2019 03:04 PM    Clinical ASCVD: No  The ASCVD Risk score Mikey Bussing DC Jr., et al., 2013) failed to calculate for the following reasons:   The valid HDL cholesterol range is 20 to 100 mg/dL    Depression screen [facility] 2/9 10/16/2020 04/16/2020 11/22/2019  Decreased Interest 1 1 3   Down, Depressed, Hopeless 2 1 2   PHQ - 2 Score 3 2 5   Altered sleeping 3 3 3   Tired, decreased energy 3 3 3   Change in appetite 1 0 2  Feeling bad or failure about yourself  3 0 2  Trouble concentrating 1 1 3   Moving  slowly or fidgety/restless 0 0 2  Suicidal thoughts 0 0 0  PHQ-9 Score 14 9 20   Difficult doing work/chores - Very difficult Extremely dIfficult  Some recent data might be hidden     Social History   Tobacco Use  Smoking Status Former   Packs/day: 0.25   Years: 8.00   Pack years: 2.00   Types: Cigarettes   Quit date: 07/01/1995   Years since quitting: 25.4  Smokeless Tobacco Never   BP Readings from Last 3 Encounters:  11/13/20 128/76  10/16/20 126/80  10/16/20 126/80   Pulse Readings from Last 3 Encounters:  11/13/20 83  10/16/20 76  10/16/20 76   Wt Readings from Last 3 Encounters:  11/13/20 211 lb 3.2 oz (95.8 kg)  10/16/20 212 lb (96.2 kg)  10/16/20 212 lb 6.4 oz (96.3 kg)   BMI Readings from Last 3 Encounters:  11/13/20 34.09 kg/m  10/16/20 34.22 kg/m  10/16/20 34.28 kg/m    Assessment/Interventions: Review of patient past medical history, allergies, medications, health status, including review of consultants reports, laboratory and other test data, was performed as part of comprehensive evaluation and provision of chronic care management services.   SDOH:  (Social Determinants of Health) assessments and interventions performed: No  SDOH Screenings   Alcohol Screen: Not on file  Depression (PHQ2-9): Medium Risk   PHQ-2 Score: 14  Financial Resource Strain: Low Risk    Difficulty of Paying Living Expenses: Not hard at all  Food Insecurity: No Food Insecurity   Worried About Charity fundraiser in the Last Year: Never true   Ran Out of Food in the Last Year: Never true  Housing: Not on file  Physical Activity: Inactive   Days of Exercise per Week: 0 days   Minutes of Exercise per Session: 0 min  Social Connections: Not on file  Stress: No Stress Concern Present   Feeling of Stress : Not at all  Tobacco Use: Medium Risk   Smoking Tobacco Use: Former   Smokeless Tobacco Use: Never  Transportation Needs: No Transportation Needs   Lack of  Transportation (Medical): No   Lack of Transportation (Non-Medical): No    CCM Care Plan  Allergies  Allergen Reactions   Diclofenac Anaphylaxis   Lamictal [Lamotrigine] Shortness Of Breath, Itching and Swelling    Tongue swells   Mobic [Meloxicam] Anaphylaxis   Ultram [Tramadol Hcl] Anaphylaxis   Nefazodone Itching and Swelling    Other reaction(s): Arthralgia (Joint Pain); Throat swelling, mouth sores   Betaseron [Interferon Beta-1b] Other (See Comments)    Caused avascular necrosis   Codeine Itching   Copaxone [Glatiramer Acetate] Other (See Comments)    Altered mental status: mental changes, anxiety, motion sickness   Gabapentin Itching  Itching if she takes more than 328m   Latex     Latex band-aids cause redness and tears your skin   Nitrofurantoin     Causing itching all over, tongue swelling, chest heaviness, swollen lips, cough, rapid HR, swollen eyelids, red eyes/gums, lips red/white per pt.   Oxcarbazepine Itching and Other (See Comments)    Dizziness, throat hurts   Teriflunomide Diarrhea    Hair loss, joint pains, elevated liver enzymes abugio (joint pains and liver issues)   Trazodone And Nefazodone     Mouth sores, tongue swelling   Tape Rash    Medications Reviewed Today     Reviewed by PMayford Knife RPH (Pharmacist) on 11/20/20 at 112 Med List Status: <None>   Medication Order Taking? Sig Documenting Provider Last Dose Status Informant  Ascorbic Acid (VITAMIN C) 1000 MG tablet [id number] Take 1,000 mg by mouth daily. [provider] Taking Active Self  aspirin EC 81 MG tablet [id number] Take 81 mg by mouth daily. [provider] Taking Active Self  baclofen (LIORESAL) 10 MG tablet [id number] Take 2 tablets (20 mg total) by mouth 3 (three) times daily. Sater, RNanine Means MD Taking Active   BLACK ELDERBERRY PO [id number] Take 300 mg by mouth in the morning and at bedtime. [provider] Taking Active    Cholecalciferol (VITAMIN D) 50 MCG (2000 UT) CAPS [id number] Take 2,000 Units by mouth daily.  [provider] Taking Active   CINNAMON PO [id number] Take 1,200 mg by mouth daily.  [provider] Taking Active   diphenhydrAMINE (BENADRYL) 25 MG tablet [id number] Take 25 mg by mouth 3 (three) times daily. Takes with Hydrocodone [provider] Taking Active Self  doxepin (SINEQUAN) 10 MG capsule [id number] TAKE 1 TO 2 CAPSULES AT BEDTIME Sater, RNanine Means MD Taking Active   EPINEPHrine 0.3 mg/0.3 mL IJ SOAJ injection [id number] Use as directed for life-threatening allergic reaction. Kozlow, EDonnamarie Poag MD Taking Active   etodolac (LODINE) 500 MG tablet [id number] Take 500 mg by mouth. 1-2 times a day [provider] Taking Active   famotidine (PEPCID) 20 MG tablet [id number] TAKE 1 TABLET BY MOUTH TWICE A DLynnell Dike MD Taking Active            Med Note (Pricilla HolmJul 14, 2022 11:48 AM) Taking 1 tablet daily   Fexofenadine-Pseudoephedrine (ALLEGRA-D PO) [id number] Take by mouth in the morning. [provider] Taking Active   Flaxseed, Linseed, (FLAX SEED OIL) 1000 MG CAPS [id number] Take 1,200 mg by mouth daily.  [provider] Taking Active   gabapentin (NEURONTIN) 300 MG capsule [id number] Take 1 capsule (300 mg total) by mouth 3 (three) times daily. Sater, RNanine Means MD Taking Active            Med Note (Pricilla HolmJul 14, 2022 11:49 AM) Taking once or twice per day.   HYDROcodone-acetaminophen (NORCO/VICODIN) 5-325 MG tablet [id number] Take 1 tablet by mouth 3 (three) times daily as needed for moderate pain. Sater, RNanine Means MD Taking Active            Med Note (Pricilla HolmJul 14, 2022 11:49 AM) Patient reports taking once or twice per day.   ipratropium (ATROVENT) 0.06 % nasal spray [id number] Use 2 sprays each nostril bid prn drainage SGlendale Chard MD  Taking Active   L-Lysine 1000 MG TABS [id number] Yes Take by mouth. As needed [provider] Taking Active   Lactobacillus (PROBIOTIC ACIDOPHILUS PO) [id number] Yes Take by mouth daily. Digestive Advantage BC   bacillus coagulans [provider] Taking Active   levocetirizine (XYZAL) 5 MG tablet [id number] Yes TAKE 1 TABLET BY MOUTH EVERY DAY IN THE Fredric Mare, MD Taking Active   LORazepam (ATIVAN) 1 MG tablet [id number] Yes Take 1 tablet (1 mg total) by mouth at bedtime. Sater, Nanine Means, MD Taking Active   Lutein-Bilberry (BILBERRY PLUS LUTEIN PO) [id number] Yes Take by mouth daily. [provider] Taking Active   Melatonin 10 MG TABS [id number] Yes Take 1 tablet by mouth at bedtime.  [provider] Taking Active Self  Moringa 500 MG CAPS [id number] Yes Take 2 capsules by mouth daily. [provider] Taking Active   Multiple Vitamins-Minerals (HAIR/SKIN/NAILS/BIOTIN) TABS [id number] Yes Take by mouth daily.  [provider] Taking Active   Nutritional Supplements (NUTRITIONAL SUPPLEMENT PO) [id number] Yes Take 2.1 g by mouth in the morning and at bedtime. LION'S MANE [provider] Taking Active   nystatin-triamcinolone ointment Lilyan Gilford) [id number] Yes Apply 1 application topically 2 (two) times daily. Minette Brine, FNP Taking Active   rizatriptan (MAXALT-MLT) 10 MG disintegrating tablet [id number] Yes Take 1 tablet (10 mg total) by mouth as needed for migraine. May repeat in 2 hours if needed.  Max of 2 tablets in 24 hours, and 10 tablets per month Sater, Nanine Means, MD Taking Active   temazepam (RESTORIL) 15 MG capsule [id number] Yes Take 1 capsule (15 mg total) by mouth at bedtime as needed for sleep. Sater, Nanine Means, MD Taking Active   timolol (BETIMOL) 0.5 % ophthalmic solution [id number] Yes Place 1 drop into the right eye 2 (two) times daily. [provider] Taking Active            Med Note Pricilla Holm  Nov 20, 2020 11:53 AM) Instill 1 drop into the right eye one time daily.   tiZANidine (ZANAFLEX) 2 MG tablet [id number] Yes TAKE UP TO 4 PILLS A DAY Sater, Nanine Means, MD Taking Active   Vitamin D, Ergocalciferol, (DRISDOL) 1.25 MG (50000 UNIT) CAPS capsule [id number] Yes ONE CAPSULE TWICE WEEKLY ON TUESDAYS/FRIDAYS [name], MD Taking Active   vitamin E 1000 UNIT capsule [id number] Yes Take 1,000 Units by mouth daily.  [provider] Taking Active Self  zolpidem (AMBIEN) 10 MG tablet [id number] Yes Take 10 mg by mouth at bedtime as needed. 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   Decreased pedal pulses 06/30/2016   Pain in both lower extremities 06/30/2016   Facial pain, atypical 06/08/2016   Fibroids 02/12/2016   Chest pain 07/07/2015   Dizziness and giddiness 07/07/2015   Leiomyoma of uterus 07/07/2015  Lower abdominal pain 07/07/2015   Hip pain 03/03/2015   Optic neuritis 06/27/2014   Multiple sclerosis ([place]) 06/26/2014   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/26/2014   Other fatigue 06/26/2014   Dysesthesia 06/26/2014   Depression with anxiety 06/26/2014   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   Insomnia 06/26/2014   Low serum vitamin D 06/26/2014    Immunization History  Administered Date(s) Administered   Tdap 03/21/2012, 04/16/2020    Conditions to be addressed/monitored:  GERD  and Vitamin D Deficiency  Care Plan : [place]  Updates made by Mayford Knife, RPH since 11/26/2020 12:00 AM     Problem: Vitamin D Deficiency, Depression, and GERD   Priority: High     Long-Range Goal: Disease Management   This Visit's Progress: On track  Note:    Current Barriers:  Unable to independently monitor therapeutic efficacy  Pharmacist Clinical Goal(s):  Patient will achieve adherence to monitoring guidelines and medication adherence to achieve therapeutic efficacy through collaboration with PharmD and provider.   Interventions: 1:1 collaboration with [name], MD regarding development and update of comprehensive plan of care as evidenced by provider attestation and co-signature Inter-disciplinary care team collaboration (see longitudinal plan of care) Comprehensive medication review performed; medication list updated in electronic medical record  GERD (Goal: reduce signs and symptoms of GERD) -Controlled -Current treatment  Famotidine 20 mg tablet twice per day Patient reports taking once per day  -Medications previously tried: none noted  -Recommended trying to use calcium carbonate to decrease acid reflux symptoms, spoke to patient about the importance of avoiding spicy or fried and fatty foods to help with symptoms.  -elimination of dietary triggers (caffeine, chocolate, spicy foods, food with high fat content, carbonated beverages, and peppermint) -We also discuss weight loss and exercise to help reduce symptoms  -Recommended to continue current medication  Depression/Anxiety (Goal: reduce signs and sypmtoms of depression) -Controlled -Current treatment: Lorazepam 1 mg at bedtime  Doxepin 10 mg capsule by mouth daily  -PHQ9: will complete during next visit  -GAD7: will complete during next visit -Connected with Dr. Felecia Shelling for mental health support -Educated on Benefits of medication for symptom control Benefits of  cognitive-behavioral therapy with or without medication -Recommended to continue current medication  Vitamin D Deficiency  -Uncontrolled -Current treatment  Vitamin D ergocalciferol 1.25 MG (50,000 UT): one capsule twice weekly -Medications previously tried: none noted   -Recommended patient continue  to take 1200 mg of calcium -Recommended to continue current medication -Collaborate with PCP to have Vitamin D lab completed again, based on patients result will recommend discontinuation of Vitamin D prescription.    Health Maintenance -Vaccine gaps: none noted at this time -Current therapy:  Vitamin E 670 mg take 1 capsule by mouth everyday Vitamin C 1000 mg tablet once per day Black Elderberry 300 mg take by mouth in the morning and at bedtime  Cinnamon 1200 mg take by mouth daily Flaxseed 1200 mg take once daily Lutein-Bilberry take daily  L-Lysine 1000 mg taking once per day Melatonin 10 mg tablet once per day at bedtime  Moringa 500 mg capsule- taking 2 daily Multivitamin - minerals (Hair/Skin/Nails/Biotin) take tablet by mouth daily Nutritional Supplement  [facility]- take 2.1 grams by mouth in the morning and at bedtime  -Educated on Herbal supplement research is limited and benefits usually cannot be proven Cost vs benefit of each product must be carefully weighed by individual consumer Supplements may interfere with prescription drugs -Patient is satisfied with current therapy  and denies issues -Recommended patient continue to discuss vitamin usage with me.   Patient Goals/Self-Care Activities Patient will:  - take medications as prescribed  Follow Up Plan: The patient has been provided with contact information for the care management team and has been advised to call with any health related questions or concerns.        Medication Assistance: Application for Epi-Pen  medication assistance program. in process.  Anticipated assistance start date 12/2020.  See plan of  care for additional detail.  Compliance/Adherence/Medication fill history: Care Gaps: None at this time   Patient's [facility]  [id number] [place] [id number]: [phone]: [phone] Uses pill box? No - not at this time  Pt endorses 80% compliance  We discussed: Benefits of medication synchronization, packaging and delivery as well as enhanced pharmacist oversight with [employer]. Patient decided to: Continue current medication management strategy  Care Plan and Follow Up Patient Decision:  Patient agrees to Care Plan and Follow-up.  Plan: The patient has been provided with contact information for the care management team and has been advised to call with any health related questions or concerns.   VOrlando Penner PharmD Clinical Pharmacist [facility] [phone]

## 2020-11-26 ENCOUNTER — Ambulatory Visit (INDEPENDENT_AMBULATORY_CARE_PROVIDER_SITE_OTHER): Payer: Medicare Other | Admitting: Podiatry

## 2020-11-26 ENCOUNTER — Ambulatory Visit (INDEPENDENT_AMBULATORY_CARE_PROVIDER_SITE_OTHER): Payer: Medicare Other

## 2020-11-26 ENCOUNTER — Other Ambulatory Visit: Payer: Self-pay

## 2020-11-26 DIAGNOSIS — K589 Irritable bowel syndrome without diarrhea: Secondary | ICD-10-CM | POA: Insufficient documentation

## 2020-11-26 DIAGNOSIS — M7662 Achilles tendinitis, left leg: Secondary | ICD-10-CM

## 2020-11-26 DIAGNOSIS — M79672 Pain in left foot: Secondary | ICD-10-CM

## 2020-11-26 DIAGNOSIS — M1990 Unspecified osteoarthritis, unspecified site: Secondary | ICD-10-CM | POA: Insufficient documentation

## 2020-11-26 NOTE — Patient Instructions (Addendum)
Visit Information It was great speaking with you today!  Please let me know if you have any questions about our visit.   Goals Addressed             This Visit's Progress    Manage My Medicine       Timeframe:  Long-Range Goal Priority:  High Start Date:                             Expected End Date:                       Follow Up Date 05/20/2021    - call for medicine refill 2 or 3 days before it runs out - call if I am sick and can't take my medicine - keep a list of all the medicines I take; vitamins and herbals too - use a pillbox to sort medicine    Why is this important?   These steps will help you keep on track with your medicines.   Notes:  Recommend patient call with any questions         Problem Identified: Vitamin D Deficiency and GERD   Priority: High     Long-Range Goal: Disease Management   Note:    Current Barriers:  Unable to independently monitor therapeutic efficacy  Pharmacist Clinical Goal(s):  Patient will achieve adherence to monitoring guidelines and medication adherence to achieve therapeutic efficacy through collaboration with PharmD and provider.   Interventions: 1:1 collaboration with [name], MD regarding development and update of comprehensive plan of care as evidenced by provider attestation and co-signature Inter-disciplinary care team collaboration (see longitudinal plan of care) Comprehensive medication review performed; medication list updated in electronic medical record  GERD (Goal: reduce signs and symptoms of GERD) -Controlled -Current treatment  Famotidine 20 mg tablet twice per day Patient reports taking once per day  -Medications previously tried: none noted  -Recommended trying to use calcium carbonate to decrease acid reflux symptoms, spoke to patient about the importance of avoiding spicy or fried and fatty foods to help with symptoms.  -elimination of dietary triggers (caffeine, chocolate, spicy foods, food  with high fat content, carbonated beverages, and peppermint) -We also discuss weight loss and exercise to help reduce symptoms  -Recommended to continue current medication  Vitamin D Deficiency  -Uncontrolled -Current treatment  Vitamin D ergocalciferol 1.25 MG (50,000 UT): one capsule twice weekly -Medications previously tried: none noted   -Recommended patient continue  to take 1200 mg of calcium -Recommended to continue current medication -Collaborate with PCP to have Vitamin D lab completed again, based on patients result will recommend discontinuation of Vitamin D prescription.   Depression/Anxiety (Goal: reduce signs and sypmtoms of depression) -Controlled -Current treatment: Lorazepam 1 mg at bedtime  Doxepin 10 mg capsule by mouth daily  -PHQ9: will complete during next visit  -GAD7: will complete during next visit -Connected with Dr. Felecia Shelling for mental health support -Educated on Benefits of medication for symptom control Benefits of cognitive-behavioral therapy with or without medication -Recommended to continue current medication   Health Maintenance -Vaccine gaps: none noted at this time -Current therapy:  Vitamin E 670 mg take 1 capsule by mouth everyday Vitamin C 1000 mg tablet once per day Black Elderberry 300 mg take by mouth in the morning and at bedtime  Cinnamon 1200 mg take by mouth daily Flaxseed 1200 mg take once daily Lutein-Bilberry take  daily  L-Lysine 1000 mg taking once per day Melatonin 10 mg tablet once per day at bedtime  Moringa 500 mg capsule- taking 2 daily Multivitamin - minerals (Hair/Skin/Nails/Biotin) take tablet by mouth daily Nutritional Supplement  [facility]- take 2.1 grams by mouth in the morning and at bedtime  -Educated on Herbal supplement research is limited and benefits usually cannot be proven Cost vs benefit of each product must be carefully weighed by individual consumer Supplements may interfere with prescription  drugs -Patient is satisfied with current therapy and denies issues -Recommended patient continue to discuss vitamin usage with me.   Patient Goals/Self-Care Activities Patient will:  - take medications as prescribed  Follow Up Plan: The patient has been provided with contact information for the care management team and has been advised to call with any health related questions or concerns.         Patient agreed to services and verbal consent obtained.   The patient verbalized understanding of instructions, educational materials, and care plan provided today and agreed to receive a mailed copy of patient instructions, educational materials, and care plan.   [name], PharmD Clinical Pharmacist [facility] [phone]

## 2020-12-02 ENCOUNTER — Encounter: Payer: Self-pay | Admitting: Podiatry

## 2020-12-02 NOTE — Progress Notes (Signed)
Subjective:  Patient ID: Grace [name], female    DOB: 09-22-1960,  MRN: [id number]  Chief Complaint  Patient presents with   Foot Problem    Left 2nd digit curling 87moduration. Pt states diagnosis of MS 26 years ago may be related due to numbness.   Foot Pain    Left posterior heel pain "sharp stabbing pain" 652mouration, no known injuries.    [age].o. female presents with the above complaint.  Patient presents with complaint left posterior heel pain is sharp stabbing nature has been on for 6 months has progressive gotten worse.  No known injuries.  She was diagnosed with MS 26 years ago.  She states that it has been hurting with ambulation.  She would like to discuss treatment options for it.  She has not seen MRIs prior to seeing me.  She has tried some over-the-counter medication none of which has helped.   Review of Systems: Negative except as noted in the HPI. Denies N/V/F/Ch.  Past Medical History:  Diagnosis Date   Allergy    Anxiety    Avascular necrosis (HCC)    Back pain    Cancer (HCC)    Chest pain    Chewing difficulty    Chronic fatigue syndrome    Colon polyp    pre-cancerous   Complication of anesthesia    patient states she requires more anesthesia   Depression    Disorder of soft tissue    Diverticulosis    Dizziness    Edema, lower extremity    Fatty liver    Fibromyalgia    GERD (gastroesophageal reflux disease)    H/O breast biopsy    pre- cancerous cells   Hand fracture, left    9/87   Heart palpitations    Heart valve disease    History of Holter monitoring    Hyperlipidemia    Insomnia    Joint pain    Lactose intolerance    Lhermitte's syndrome    Migraines    Multiple food allergies    Multiple sclerosis (HCC)    dx in 11/1996   Multiple sclerosis (HCC)    Neuromuscular disorder (HCC)    Neuropathy    Numbness and tingling in hands    Obesity    Optic neuritis    OSA (obstructive sleep apnea)    no longer have OSA since  stopped taking a MS medication   Osteoarthritis    Otitis media    Palpitations    Prediabetes    Raynaud's phenomenon    Shortness of breath    Sleep apnea    Due to medication that she no longer takes.   Swallowing difficulty    Vaginitis and vulvovaginitis    Vitamin B12 deficiency    Vitamin D deficiency     Current Outpatient Medications:    Ascorbic Acid (VITAMIN C) 1000 MG tablet, Take 1,000 mg by mouth daily., Disp: , Rfl:    aspirin EC 81 MG tablet, Take 81 mg by mouth daily., Disp: , Rfl:    baclofen (LIORESAL) 10 MG tablet, Take 2 tablets (20 mg total) by mouth 3 (three) times daily., Disp: 180 tablet, Rfl: 11   BLACK ELDERBERRY PO, Take 300 mg by mouth in the morning and at bedtime., Disp: , Rfl:    Cholecalciferol (VITAMIN D) 50 MCG (2000 UT) CAPS, Take 2,000 Units by mouth daily. , Disp: , Rfl:    CINNAMON PO, Take 1,200 mg by mouth  daily. , Disp: , Rfl:    diphenhydrAMINE (BENADRYL) 25 MG tablet, Take 25 mg by mouth 3 (three) times daily. Takes with Hydrocodone, Disp: , Rfl:    doxepin (SINEQUAN) 10 MG capsule, TAKE 1 TO 2 CAPSULES AT BEDTIME, Disp: 180 capsule, Rfl: 2   EPINEPHrine 0.3 mg/0.3 mL IJ SOAJ injection, Use as directed for life-threatening allergic reaction., Disp: 1 each, Rfl: 3   etodolac (LODINE) 500 MG tablet, Take 500 mg by mouth. 1-2 times a day, Disp: , Rfl:    famotidine (PEPCID) 20 MG tablet, TAKE 1 TABLET BY MOUTH TWICE A DAY, Disp: 180 tablet, Rfl: 1   Fexofenadine-Pseudoephedrine (ALLEGRA-D PO), Take by mouth in the morning., Disp: , Rfl:    Flaxseed, Linseed, (FLAX SEED OIL) 1000 MG CAPS, Take 1,200 mg by mouth daily. , Disp: , Rfl:    gabapentin (NEURONTIN) 300 MG capsule, Take 1 capsule (300 mg total) by mouth 3 (three) times daily., Disp: 90 capsule, Rfl: 11   HYDROcodone-acetaminophen (NORCO/VICODIN) 5-325 MG tablet, Take 1 tablet by mouth 3 (three) times daily as needed for moderate pain., Disp: 90 tablet, Rfl: 0   ipratropium (ATROVENT)  0.06 % nasal spray, Use 2 sprays each nostril bid prn drainage, Disp: 30 mL, Rfl: 5   L-Lysine 1000 MG TABS, Take by mouth. As needed, Disp: , Rfl:    Lactobacillus (PROBIOTIC ACIDOPHILUS PO), Take by mouth daily. Digestive Advantage BC   bacillus coagulans, Disp: , Rfl:    levocetirizine (XYZAL) 5 MG tablet, TAKE 1 TABLET BY MOUTH EVERY DAY IN THE EVENING, Disp: 90 tablet, Rfl: 2   LORazepam (ATIVAN) 1 MG tablet, Take 1 tablet (1 mg total) by mouth at bedtime., Disp: 30 tablet, Rfl: 5   Lutein-Bilberry (BILBERRY PLUS LUTEIN PO), Take by mouth daily., Disp: , Rfl:    Melatonin 10 MG TABS, Take 1 tablet by mouth at bedtime. , Disp: , Rfl:    Moringa 500 MG CAPS, Take 2 capsules by mouth daily., Disp: , Rfl:    Multiple Vitamins-Minerals (HAIR/SKIN/NAILS/BIOTIN) TABS, Take by mouth daily. , Disp: , Rfl:    Nutritional Supplements (NUTRITIONAL SUPPLEMENT PO), Take 2.1 g by mouth in the morning and at bedtime. LION'S MANE, Disp: , Rfl:    nystatin-triamcinolone ointment (MYCOLOG), Apply 1 application topically 2 (two) times daily., Disp: 30 g, Rfl: 2   Oxcarbazepine (TRILEPTAL) 300 MG tablet, Take 300 mg by mouth 3 (three) times daily., Disp: , Rfl:    rizatriptan (MAXALT-MLT) 10 MG disintegrating tablet, Take 1 tablet (10 mg total) by mouth as needed for migraine. May repeat in 2 hours if needed.  Max of 2 tablets in 24 hours, and 10 tablets per month, Disp: 10 tablet, Rfl: 11   temazepam (RESTORIL) 15 MG capsule, Take 1 capsule (15 mg total) by mouth at bedtime as needed for sleep., Disp: 30 capsule, Rfl: 2   timolol (BETIMOL) 0.5 % ophthalmic solution, Place 1 drop into the right eye 2 (two) times daily., Disp: , Rfl:    timolol (TIMOPTIC) 0.5 % ophthalmic solution, Place 1 drop into the right eye daily., Disp: , Rfl:    tiZANidine (ZANAFLEX) 2 MG tablet, TAKE UP TO 4 PILLS A DAY, Disp: 120 tablet, Rfl: 1   Vitamin D, Ergocalciferol, (DRISDOL) 1.25 MG (50000 UNIT) CAPS capsule, ONE CAPSULE TWICE  WEEKLY ON TUESDAYS/FRIDAYS, Disp: 24 capsule, Rfl: 2   vitamin E 1000 UNIT capsule, Take 1,000 Units by mouth daily. , Disp: , Rfl:  vitamin E 1000 UNIT capsule, vitamin E 670 mg (1,000 unit) capsule   1 capsule every day by oral route., Disp: , Rfl:    zolpidem (AMBIEN) 10 MG tablet, Take 10 mg by mouth at bedtime as needed. For sleep, Disp: , Rfl:   Social History   Tobacco Use  Smoking Status Former   Packs/day: 0.25   Years: 8.00   Pack years: 2.00   Types: Cigarettes   Quit date: 07/01/1995   Years since quitting: 25.4  Smokeless Tobacco Never    Allergies  Allergen Reactions   Diclofenac Anaphylaxis   Lamictal [Lamotrigine] Shortness Of Breath, Itching and Swelling    Tongue swells   Mobic [Meloxicam] Anaphylaxis   Ultram [Tramadol Hcl] Anaphylaxis   Nefazodone Itching and Swelling    Other reaction(s): Arthralgia (Joint Pain); Throat swelling, mouth sores   Betaseron [Interferon Beta-1b] Other (See Comments)    Caused avascular necrosis   Codeine Itching   Copaxone [Glatiramer Acetate] Other (See Comments)    Altered mental status: mental changes, anxiety, motion sickness   Gabapentin Itching    Itching if she takes more than '300mg'$    Latex     Latex band-aids cause redness and tears your skin   Nitrofurantoin     Causing itching all over, tongue swelling, chest heaviness, swollen lips, cough, rapid HR, swollen eyelids, red eyes/gums, lips red/white per pt.   Oxcarbazepine Itching and Other (See Comments)    Dizziness, throat hurts   Teriflunomide Diarrhea    Hair loss, joint pains, elevated liver enzymes abugio (joint pains and liver issues)   Trazodone And Nefazodone     Mouth sores, tongue swelling   Tape Rash   Objective:  There were no vitals filed for this visit. There is no height or weight on file to calculate BMI. Constitutional Well developed. Well nourished.  Vascular Dorsalis pedis pulses palpable bilaterally. Posterior tibial pulses palpable  bilaterally. Capillary refill normal to all digits.  No cyanosis or clubbing noted. Pedal hair growth normal.  Neurologic Normal speech. Oriented to person, place, and time. Epicritic sensation to light touch grossly present bilaterally.  Dermatologic Nails well groomed and normal in appearance. No open wounds. No skin lesions.  Orthopedic: Pain on palpation to the left Achilles tendon insertion.  Posterior heel spurring noted.  Haglund's deformity noted.  Pain with range of motion of the ankle joint with dorsiflexion of the ankle joint.  No pain with plantarflexion of the ankle joint.  No deep intra-articular ankle joint pain noted.  No pain at the posterior tibial tendon, peroneal tendon, ATFL ligament.  Positive Silfverskiold test with gastrocnemius equinus   Radiographs: 3 views of skeletally mature adult left foot: Posterior spurring noted with underlying Haglund's deformity.  Pes cavus foot structure noted no other bony abnormalities identified Assessment:   1. Left Achilles tendinitis    Plan:  Patient was evaluated and treated and all questions answered.  Left Achilles tendinitis with underlying gastrocnemius equinus/Haglund's deformity/posterior heel spurring -I explained to patient the etiology of Achilles tendinitis and various treatment options were discussed.  Given the amount of pain that she is having I believe she will benefit from cam boot immobilization and allow the soft tissue around the insertion to calm down appropriately.  If there is no resolve over this for steroid injection during next clinical visit.  Patient states understanding would like to proceed with that. -Cam boot was dispensed  No follow-ups on file.

## 2020-12-03 NOTE — Telephone Encounter (Signed)
Please see note from 11/20/2020 for patients CCM visit.  [name], PharmD Clinical Pharmacist [facility] [phone]

## 2020-12-04 ENCOUNTER — Encounter: Payer: Self-pay | Admitting: Podiatry

## 2020-12-06 IMAGING — MG DIGITAL SCREENING BILAT W/ TOMO W/ CAD
8 series · 8 of 24 positions shown · non-contrast
Comparison: Previous exam(s).

CLINICAL DATA: Screening.

EXAM:
DIGITAL SCREENING BILATERAL MAMMOGRAM WITH TOMO AND CAD

[R CC synth-2D]
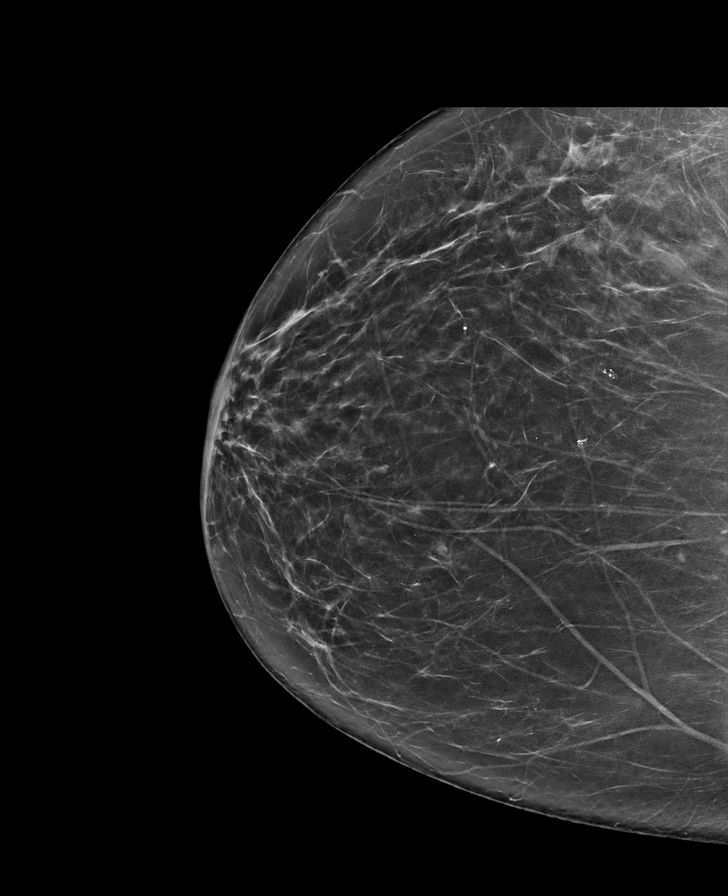

[L CC synth-2D]
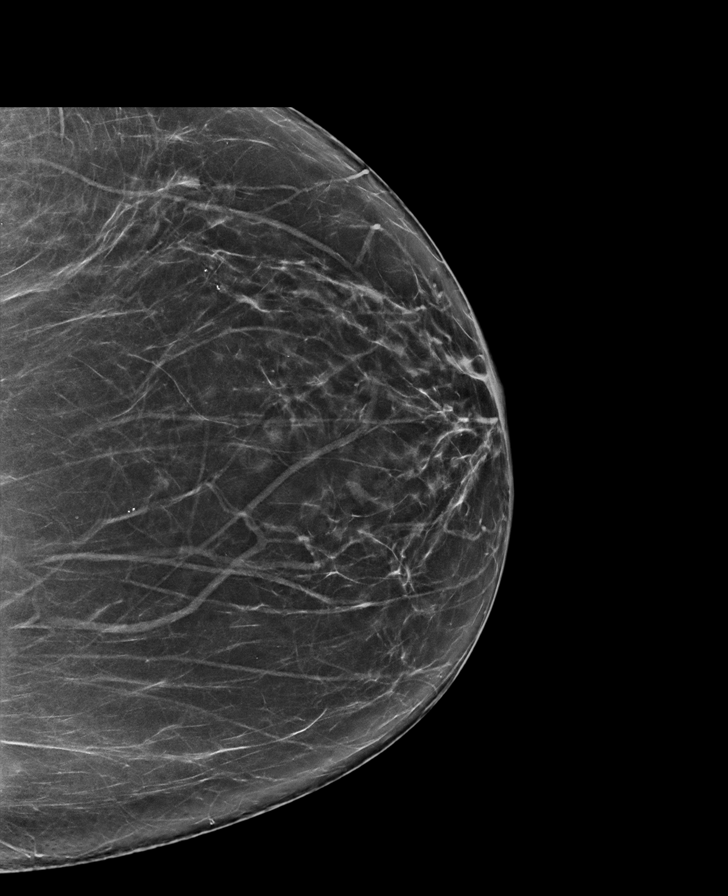

[L MLO synth-2D]
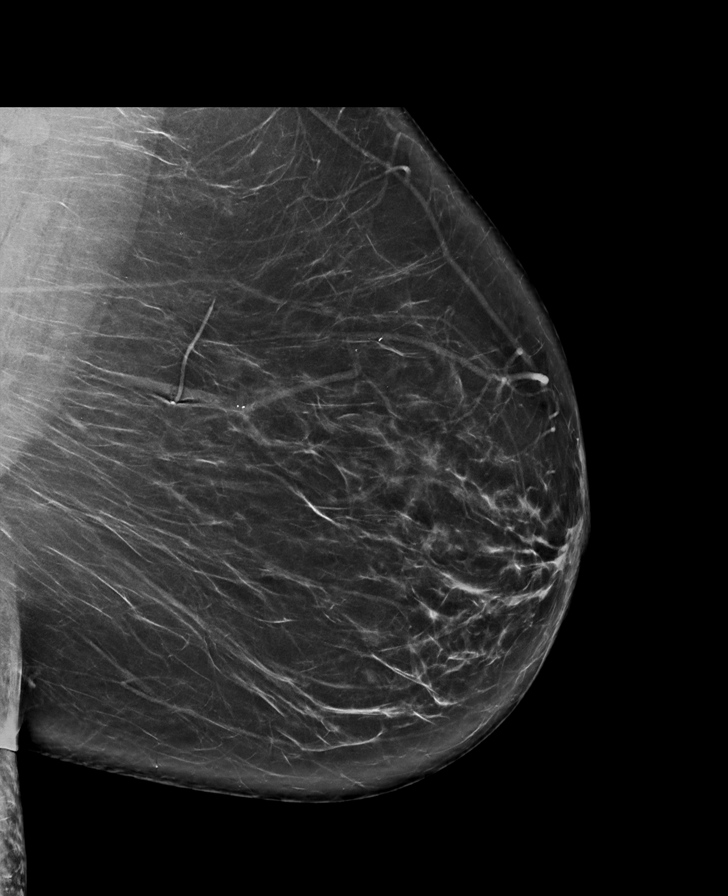

[R MLO synth-2D]
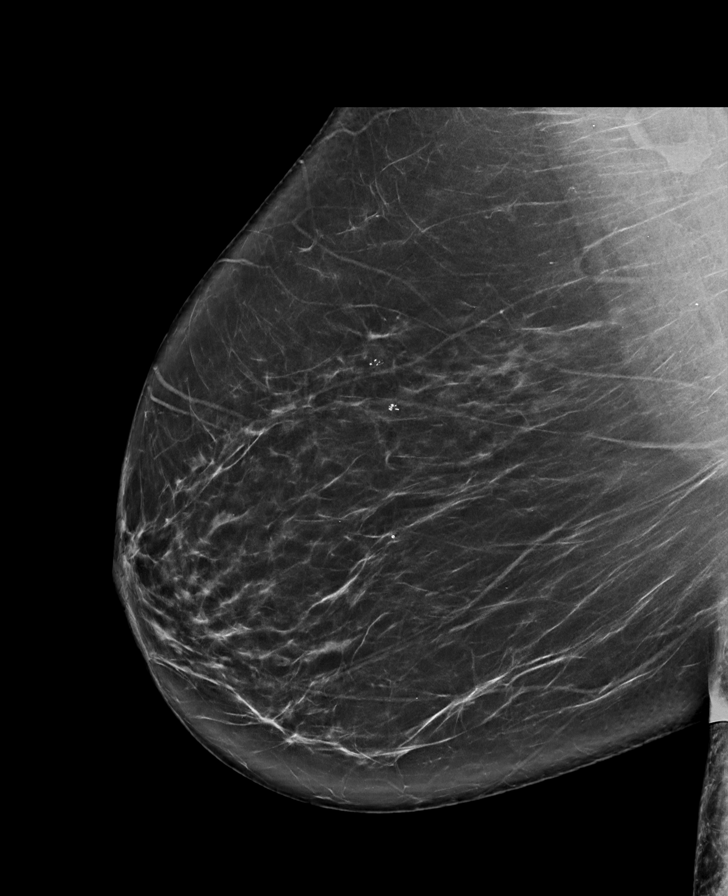

[L MLO tomo · tomo slice 45/89.0]
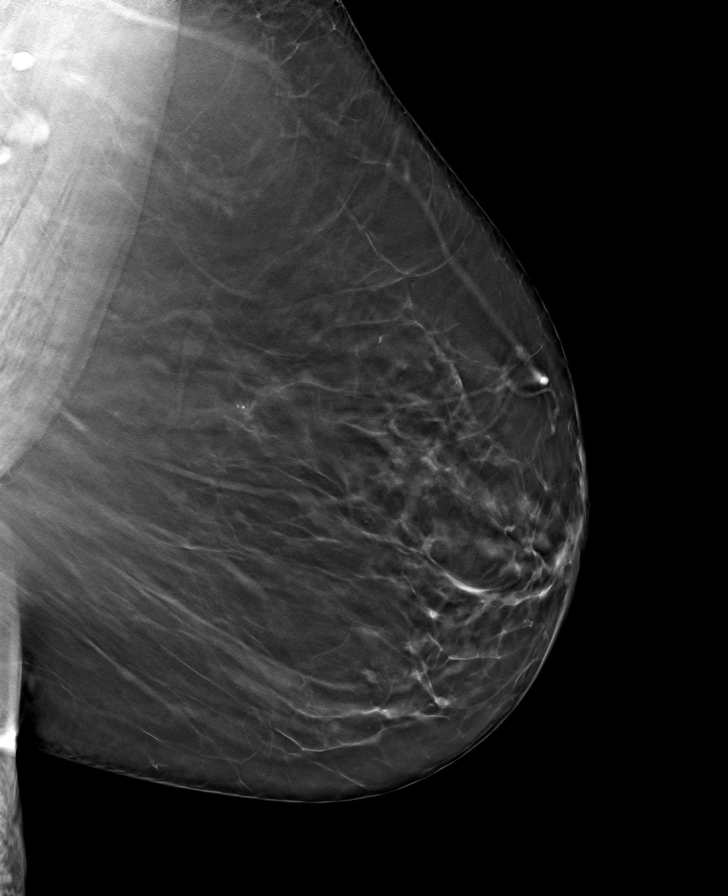

[R MLO tomo · tomo slice 44/87.0]
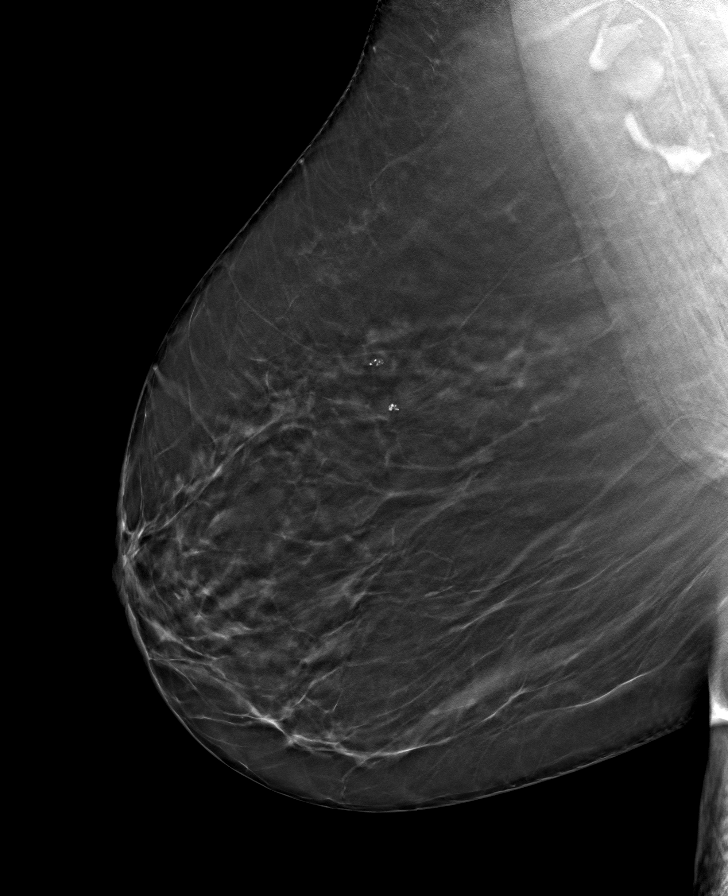

[R CC tomo · tomo slice 36/71.0]
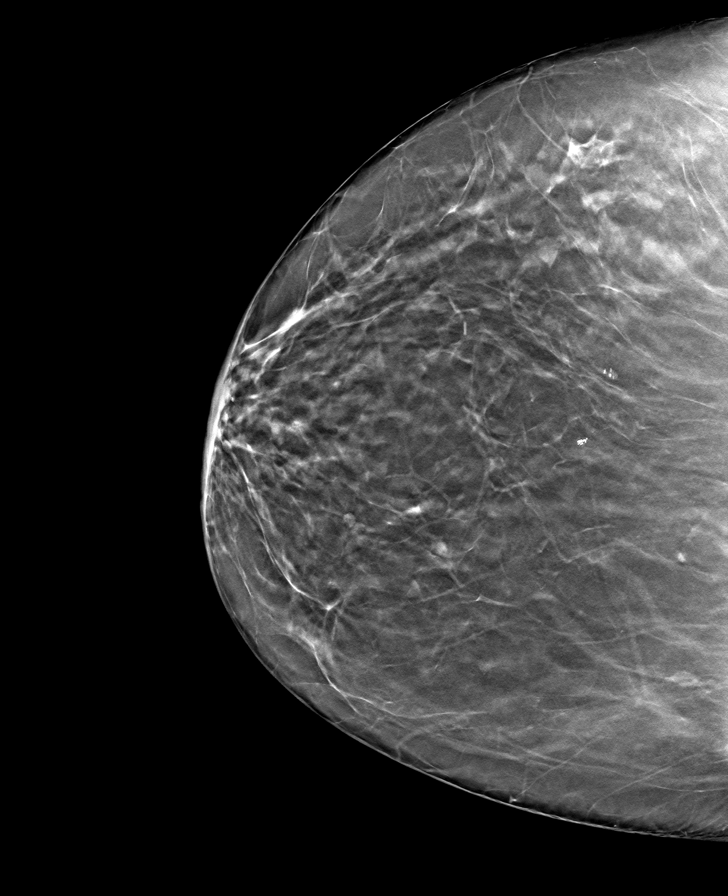

[L CC tomo · tomo slice 39/76.0]
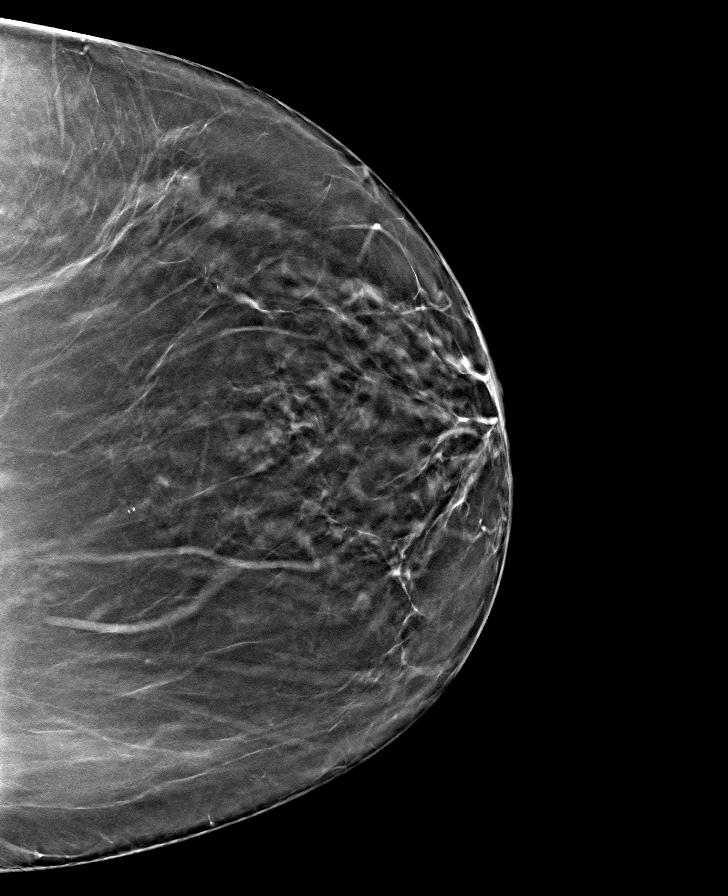

[8 of 24 positions shown; findings below may reference images not displayed]

ACR Breast Density Category b: There are scattered areas of
fibroglandular density.
FINDINGS: There are no findings suspicious for malignancy. Images were
processed with CAD.
IMPRESSION: No mammographic evidence of malignancy. A result letter of this
screening mammogram will be mailed directly to the patient.

RECOMMENDATION:
Screening mammogram in one year. (Code:[id number])

BI-RADS CATEGORY  1: Negative.

## 2020-12-17 ENCOUNTER — Encounter: Payer: Self-pay | Admitting: Neurology

## 2020-12-17 ENCOUNTER — Other Ambulatory Visit: Payer: Self-pay

## 2020-12-17 ENCOUNTER — Ambulatory Visit (INDEPENDENT_AMBULATORY_CARE_PROVIDER_SITE_OTHER): Payer: Medicare Other | Admitting: Neurology

## 2020-12-17 VITALS — BP 134/85 | HR 102 | Ht 66.0 in | Wt 213.3 lb

## 2020-12-17 DIAGNOSIS — G35 Multiple sclerosis: Secondary | ICD-10-CM | POA: Diagnosis not present

## 2020-12-17 DIAGNOSIS — F418 Other specified anxiety disorders: Secondary | ICD-10-CM

## 2020-12-17 DIAGNOSIS — R260 Ataxic gait: Secondary | ICD-10-CM

## 2020-12-17 DIAGNOSIS — R200 Anesthesia of skin: Secondary | ICD-10-CM | POA: Diagnosis not present

## 2020-12-17 DIAGNOSIS — R5383 Other fatigue: Secondary | ICD-10-CM

## 2020-12-17 DIAGNOSIS — Z79891 Long term (current) use of opiate analgesic: Secondary | ICD-10-CM | POA: Diagnosis not present

## 2020-12-17 MED ORDER — METHYLPHENIDATE HCL ER (OSM) 27 MG PO TBCR: 27.0000 mg | EXTENDED_RELEASE_TABLET | ORAL | 0 refills | Status: DC

## 2020-12-17 MED ORDER — HYDROCODONE-ACETAMINOPHEN 5-325 MG PO TABS: 1.0000 | ORAL_TABLET | Freq: Three times a day (TID) | ORAL | 0 refills | Status: DC | PRN

## 2020-12-17 NOTE — Progress Notes (Signed)
[facility]  PATIENT: Grace [name] DOB: May 06, 1961  REFERRING DOCTOR OR PCP:  [name] SOURCE: patient  _________________________________   HISTORICAL  CHIEF COMPLAINT:  Chief Complaint  Patient presents with   Follow-up    Rm 2 alone    Multiple Sclerosis    Reports leg weakness has increase since her last visit. One fall has taken place since last visit on 11/21/2020. Off DMT's at the moment.  Reports her taste has improved since her last visit.    HISTORY OF PRESENT ILLNESS:  Grace [name] is a 60 y.o. woman with relapsing remitting multiple sclerosis.     Update 12/17/2020: She is off a  DMT for MS.  She is neurologically stable.     She had a fall recently when she tripped.   She saw orthopedics due to sciatica and hip pain.   She was prescribed a steroid pack and pain improved.     She has tingling in her arms and feet..   She feels her hand grip is reduced.    NCV/EMG had not shown neuropathy.   Legs are doing ok.    She has urinary urgency which is worse.    Her gait is mildly off balance but no falls.  She continues to experience some dysesthesias especially in the hands.    She has pain in the neck, back.  She gets some benefit from gabapentin and hydrocodone.  She continues to have vision problems since cataract surgery and complications with elevated IOP.    She is sleeping poorly despite hydrocodone, baclofen, doxepin, melatonin, benadryl at night.  Temazepma helped her fall asleep but not stay asleep.     Ambien had helped her some in the past but she is concerned about side effects (one of her friends had sleep-driving and wrecked).     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 She had optic neuritis in 1999. In 2002, she had right-sided numbness. MRIs of the brain show just a couple of  nonspecific foci. A cervical spine MRI showed a focus consistent with demyelination at C5. Additionally, there are 2 smaller foci at T2 and T3-T4.  CSF was also abnormal c/w MS.     She was initially placed on Betaseron and then on Copaxone. She was unable to tolerate either of these and has not been on any DMT medication since 2003.   She retried Betaseron but stopped after diagnosis of avascular necrosis requiring hip replacement.  She was on Copaxone for a while.   Of note, she had had only one course of IV steroids prior to that and has had some afterwards.   She had not wanted to go on Tysabri or Gilenya and opted to go on no disease modifying therapy since 2012. At her last visit on 01/17/2014, she decided to go on Aubagio but due to elevated liver function test stopped..     Most recent MRIs are from earlier this year and showed no changes.  She has been off of all disease modifying therapy since 2018.  02/05/2018: Brain MRI showed "Multiple T2/FLAIR hyperintense foci in the hemispheres.  This is a nonspecific finding and could be due to chronic microvascular ischemic changes or 2 demyelination.  None of the foci appears to be acute.  When compared to the MRI dated 07/03/2016, there  is no interval change.  There is a normal enhancement pattern.   REVIEW OF SYSTEMS: Constitutional: No fevers, chills, sweats, or change in appetite.  Has fatigue Eyes: No visual changes, double vision, eye pain Ear, nose and throat: No hearing loss, ear pain, nasal congestion, sore throat Cardiovascular: No chest pain, palpitations Respiratory:  No shortness of breath at rest or with exertion.   No wheezes GastrointestinaI: No nausea, vomiting, diarrhea, abdominal pain, fecal incontinence Genitourinary:  No dysuria, urinary retention or frequency.  No nocturia. Musculoskeletal:  She reports back pain and pain and some of her joints, especially her knees and hips. Integumentary: No rash, pruritus, skin  lesions Neurological: as above.  She reports restless leg, insomnia and excessive daytime sleepiness. Psychiatric: Has had depression and anxiety.  Endocrine: No palpitations, diaphoresis, change in appetite, change in weigh.  He notes heat intolerance and excessive thirst at times. Hematologic/Lymphatic:  No anemia, purpura, petechiae. Allergic/Immunologic: No itchy/runny eyes, nasal congestion, recent allergic reactions, rashes  ALLERGIES: Allergies  Allergen Reactions   Diclofenac Anaphylaxis   Lamictal [Lamotrigine] Shortness Of Breath, Itching and Swelling    Tongue swells   Mobic [Meloxicam] Anaphylaxis   Ultram [Tramadol Hcl] Anaphylaxis   Nefazodone Itching and Swelling    Other reaction(s): Arthralgia (Joint Pain); Throat swelling, mouth sores   Betaseron [Interferon Beta-1b] Other (See Comments)    Caused avascular necrosis   Codeine Itching   Copaxone [Glatiramer Acetate] Other (See Comments)    Altered mental status: mental changes, anxiety, motion sickness   Gabapentin Itching    Itching if she takes more than '300mg'$    Latex     Latex band-aids cause redness and tears your skin   Nitrofurantoin     Causing itching all over, tongue swelling, chest heaviness, swollen lips, cough, rapid HR, swollen eyelids, red eyes/gums, lips red/white per pt.   Oxcarbazepine Itching and Other (See Comments)    Dizziness, throat hurts   Teriflunomide Diarrhea    Hair loss, joint pains, elevated liver enzymes abugio (joint pains and liver issues)   Trazodone And Nefazodone     Mouth sores, tongue swelling   Tape Rash    HOME MEDICATIONS:  Current Outpatient Medications:    Ascorbic Acid (VITAMIN C) 1000 MG tablet, Take 1,000 mg by mouth daily., Disp: , Rfl:    aspirin EC 81 MG tablet, Take 81 mg by mouth daily., Disp: , Rfl:    baclofen (LIORESAL) 10 MG tablet, Take 2 tablets (20 mg total) by mouth 3 (three) times daily., Disp: 180 tablet, Rfl: 11   BLACK ELDERBERRY PO, Take 300 mg  by mouth in the morning and at bedtime., Disp: , Rfl:    Cholecalciferol (VITAMIN D) 50 MCG (2000 UT) CAPS, Take 2,000 Units by mouth daily. , Disp: , Rfl:    CINNAMON PO, Take 1,200 mg by mouth daily. , Disp: , Rfl:    diphenhydrAMINE (BENADRYL) 25 MG tablet, Take 25 mg by mouth 3 (three) times daily. Takes with Hydrocodone, Disp: , Rfl:    doxepin (SINEQUAN) 10 MG capsule, TAKE 1 TO 2 CAPSULES AT BEDTIME, Disp: 180 capsule, Rfl: 2   EPINEPHrine 0.3 mg/0.3 mL IJ SOAJ injection, Use as directed for life-threatening allergic reaction., Disp: 1 each, Rfl: 3   etodolac (LODINE) 500 MG tablet, Take 500 mg by mouth. 1-2 times a day, Disp: , Rfl:    famotidine (PEPCID) 20 MG tablet, TAKE 1 TABLET BY MOUTH TWICE A DAY, Disp: 180 tablet, Rfl: 1  Fexofenadine-Pseudoephedrine (ALLEGRA-D PO), Take by mouth in the morning., Disp: , Rfl:    Flaxseed, Linseed, (FLAX SEED OIL) 1000 MG CAPS, Take 1,200 mg by mouth daily. , Disp: , Rfl:    gabapentin (NEURONTIN) 300 MG capsule, Take 1 capsule (300 mg total) by mouth 3 (three) times daily., Disp: 90 capsule, Rfl: 11   ipratropium (ATROVENT) 0.06 % nasal spray, Use 2 sprays each nostril bid prn drainage, Disp: 30 mL, Rfl: 5   L-Lysine 1000 MG TABS, Take by mouth. As needed, Disp: , Rfl:    Lactobacillus (PROBIOTIC ACIDOPHILUS PO), Take by mouth daily. Digestive Advantage BC   bacillus coagulans, Disp: , Rfl:    levocetirizine (XYZAL) 5 MG tablet, TAKE 1 TABLET BY MOUTH EVERY DAY IN THE EVENING, Disp: 90 tablet, Rfl: 2   LORazepam (ATIVAN) 1 MG tablet, Take 1 tablet (1 mg total) by mouth at bedtime., Disp: 30 tablet, Rfl: 5   Lutein-Bilberry (BILBERRY PLUS LUTEIN PO), Take by mouth daily., Disp: , Rfl:    Melatonin 10 MG TABS, Take 1 tablet by mouth at bedtime. , Disp: , Rfl:    methylphenidate (CONCERTA) 27 MG PO CR tablet, Take 1 tablet (27 mg total) by mouth every morning., Disp: 30 tablet, Rfl: 0   Moringa 500 MG CAPS, Take 2 capsules by mouth daily., Disp: ,  Rfl:    Multiple Vitamins-Minerals (HAIR/SKIN/NAILS/BIOTIN) TABS, Take by mouth daily. , Disp: , Rfl:    Nutritional Supplements (NUTRITIONAL SUPPLEMENT PO), Take 2.1 g by mouth in the morning and at bedtime. LION'S MANE, Disp: , Rfl:    nystatin-triamcinolone ointment (MYCOLOG), Apply 1 application topically 2 (two) times daily., Disp: 30 g, Rfl: 2   Oxcarbazepine (TRILEPTAL) 300 MG tablet, Take 300 mg by mouth 3 (three) times daily., Disp: , Rfl:    rizatriptan (MAXALT-MLT) 10 MG disintegrating tablet, Take 1 tablet (10 mg total) by mouth as needed for migraine. May repeat in 2 hours if needed.  Max of 2 tablets in 24 hours, and 10 tablets per month, Disp: 10 tablet, Rfl: 11   temazepam (RESTORIL) 15 MG capsule, Take 1 capsule (15 mg total) by mouth at bedtime as needed for sleep., Disp: 30 capsule, Rfl: 2   tiZANidine (ZANAFLEX) 2 MG tablet, TAKE UP TO 4 PILLS A DAY, Disp: 120 tablet, Rfl: 1   Vitamin D, Ergocalciferol, (DRISDOL) 1.25 MG (50000 UNIT) CAPS capsule, ONE CAPSULE TWICE WEEKLY ON TUESDAYS/FRIDAYS, Disp: 24 capsule, Rfl: 2   vitamin E 1000 UNIT capsule, Take 1,000 Units by mouth daily. , Disp: , Rfl:    vitamin E 1000 UNIT capsule, vitamin E 670 mg (1,000 unit) capsule   1 capsule every day by oral route., Disp: , Rfl:    zolpidem (AMBIEN) 10 MG tablet, Take 10 mg by mouth at bedtime as needed. For sleep, Disp: , Rfl:    HYDROcodone-acetaminophen (NORCO/VICODIN) 5-325 MG tablet, Take 1 tablet by mouth 3 (three) times daily as needed for moderate pain., Disp: 90 tablet, Rfl: 0   timolol (BETIMOL) 0.5 % ophthalmic solution, Place 1 drop into the right eye 2 (two) times daily. (Patient not taking: Reported on 12/17/2020), Disp: , Rfl:    timolol (TIMOPTIC) 0.5 % ophthalmic solution, Place 1 drop into the right eye daily. (Patient not taking: Reported on 12/17/2020), Disp: , Rfl:   PAST MEDICAL HISTORY: Past Medical History:  Diagnosis Date   Allergy    Anxiety    Avascular necrosis  (HCC)    Back pain  Cancer [facility])    Chest pain    Chewing difficulty    Chronic fatigue syndrome    Colon polyp    pre-cancerous   Complication of anesthesia    patient states she requires more anesthesia   Depression    Disorder of soft tissue    Diverticulosis    Dizziness    Edema, lower extremity    Fatty liver    Fibromyalgia    GERD (gastroesophageal reflux disease)    H/O breast biopsy    pre- cancerous cells   Hand fracture, left    9/87   Heart palpitations    Heart valve disease    History of Holter monitoring    Hyperlipidemia    Insomnia    Joint pain    Lactose intolerance    Lhermitte's syndrome    Migraines    Multiple food allergies    Multiple sclerosis (HCC)    dx in 11/1996   Multiple sclerosis (HCC)    Neuromuscular disorder (HCC)    Neuropathy    Numbness and tingling in hands    Obesity    Optic neuritis    OSA (obstructive sleep apnea)    no longer have OSA since stopped taking a MS medication   Osteoarthritis    Otitis media    Palpitations    Prediabetes    Raynaud's phenomenon    Shortness of breath    Sleep apnea    Due to medication that she no longer takes.   Swallowing difficulty    Vaginitis and vulvovaginitis    Vitamin B12 deficiency    Vitamin D deficiency     PAST SURGICAL HISTORY: Past Surgical History:  Procedure Laterality Date   ABDOMINAL HYSTERECTOMY Bilateral 02/12/2016   Procedure: HYSTERECTOMY ABDOMINAL WITH BILATERAL SALPINGO OOPHERECTOMY;  Surgeon: Dian Queen, MD;  Location: [facility];  Service: Gynecology;  Laterality: Bilateral;   BREAST EXCISIONAL BIOPSY     core/left breast   CATARACT EXTRACTION, BILATERAL  06/2019   CHOLECYSTECTOMY  1983   COLONOSCOPY     1997/2001   DILATION AND CURETTAGE OF UTERUS     ENDOMETRIAL ABLATION  2012   LAPAROSCOPIC ABDOMINAL EXPLORATION     ovaries and intestines bond together   LEFT HEART CATHETERIZATION WITH CORONARY ANGIOGRAM N/A 06/21/2011   Procedure: LEFT  HEART CATHETERIZATION WITH CORONARY ANGIOGRAM;  Surgeon: Leonie Man, MD;  Location: [facility] CATH LAB;  Service: Cardiovascular;  Laterality: N/A;   LUMBAR PUNCTURE     11/01/1996   SALPINGOOPHORECTOMY Bilateral 02/12/2016   Procedure: BILATERAL SALPINGO OOPHORECTOMY;  Surgeon: Dian Queen, MD;  Location: [facility];  Service: Gynecology;  Laterality: Bilateral;   TOTAL HIP ARTHROPLASTY     left hip  6/02  /right hip12/04    FAMILY HISTORY: Family History  Problem Relation Age of Onset   Pancreatic cancer Mother    Stroke Mother    Hypertension Mother    Hyperlipidemia Mother    Heart disease Mother    Cancer Mother    Depression Mother    Anxiety disorder Mother    Alcoholism Mother    Testicular cancer Brother    Colon cancer Maternal Uncle    Pancreatic cancer Paternal Uncle    Prostate cancer Maternal Grandfather    Colon polyps Father    Diabetes Father    Hypertension Father    Hyperlipidemia Father    Kidney disease Father    Sleep apnea Father    Obesity Father  SOCIAL HISTORY:  Social History   Socioeconomic History   Marital status: Widowed    Spouse name: Not on file   Number of children: Not on file   Years of education: Not on file   Highest education level: Not on file  Occupational History   Occupation: disabled  Tobacco Use   Smoking status: Former    Packs/day: 0.25    Years: 8.00    Pack years: 2.00    Types: Cigarettes    Quit date: 07/01/1995    Years since quitting: 25.4   Smokeless tobacco: Never  Vaping Use   Vaping Use: Never used  Substance and Sexual Activity   Alcohol use: No    Alcohol/week: 0.0 standard drinks   Drug use: Yes    Types: Hydrocodone   Sexual activity: Not Currently  Other Topics Concern   Not on file  Social History Narrative   Not on file   Social Determinants of Health   Financial Resource Strain: Low Risk    Difficulty of Paying Living Expenses: Not hard at all  Food Insecurity: No Food Insecurity    Worried About Charity fundraiser in the Last Year: Never true   [place] in the Last Year: Never true  Transportation Needs: No Transportation Needs   Lack of Transportation (Medical): No   Lack of Transportation (Non-Medical): No  Physical Activity: Inactive   Days of Exercise per Week: 0 days   Minutes of Exercise per Session: 0 min  Stress: No Stress Concern Present   Feeling of Stress : Not at all  Social Connections: Not on file  Intimate Partner Violence: Not on file     PHYSICAL EXAM  Vitals:   12/17/20 1556  BP: 134/85  Pulse: (!) 102  Weight: 213 lb 5 oz (96.8 kg)  Height: '5\' 6"'$  (1.676 m)    Body mass index is 34.43 kg/m.   General: The patient is well-developed and well-nourished and in no acute distress  Neurologic Exam  Mental status: The patient is alert and oriented x 3 at the time of the examination. The patient has apparent normal recent and remote memory, with an apparently normal attention span and concentration ability.   Speech is normal.  Cranial nerves: Extraocular movements are full.  She has altered color vision on the right.  Facial strength and sensation was normal.. No obvious hearing deficits are noted.  Motor:  Muscle bulk is normal.   She has increased muscle tone in the legs, left greater than right.  Strength is 5/5 except for the left 4+/5 left EHL  Sensory: Sensory testing shows reduced touch sensation on the right side and reduced vibration on the right..   Coordination: Cerebellar testing reveals good finger-nose-finger and mildly reduced bilateral heel-to-shin  Gait and station: Station is normal.  The gait is mildly wide.  Tandem gait is poor.  Romberg is negative.   Reflexes: Deep tendon reflexes are symmetric and normal bilaterally.         ASSESSMENT AND PLAN  Multiple sclerosis ([place]) - Plan: Drug Screen, Ur (12+Oxycodone+Crt)  Chronic prescription opiate use - Plan: Drug Screen, Ur  (12+Oxycodone+Crt)  Numbness  Ataxic gait  Depression with anxiety  Other fatigue   1.   She will continue off a DMT for now.  Due to s.e. she prefers not to restart even though the last MRI of the cervical spine did show 1 lesion not present on a previous MRI from 2016 (  plus two older lesions) 2    Concerta 27 mg for fatigue and cognition.   3. Continue Hydrocodone 5 mg up to 3 times a day for pain.  The PDMP was reviewed and she is not getting opiates from other doctors.  She does not show any drug-seeking behavior.  We will check a urine drug screen today. 4.   She will return to see me in 4 months or sooner if she has new or worsening neurologic symptoms.    Amato Sevillano A. Felecia Shelling, MD, PhD [id number], [id number] PM Certified in Neurology, Clinical Neurophysiology, Sleep Medicine, Pain Medicine and Neuroimaging  [facility] [address], [place], [place] [address] [phone]

## 2020-12-18 ENCOUNTER — Ambulatory Visit (INDEPENDENT_AMBULATORY_CARE_PROVIDER_SITE_OTHER): Payer: Medicare Other

## 2020-12-18 DIAGNOSIS — F418 Other specified anxiety disorders: Secondary | ICD-10-CM

## 2020-12-18 DIAGNOSIS — G35 Multiple sclerosis: Secondary | ICD-10-CM

## 2020-12-18 NOTE — Chronic Care Management (AMB) (Signed)
Chronic Care Management    Social Work Note  12/18/2020 Name: Grace [name] MRN: [id number] DOB: [birth year]  Grace [name] is a 60 y.o. year old female who is a primary care patient of [name], MD. The CCM team was consulted to assist the patient with chronic disease management and/or care coordination needs related to:  MS, Depression, and Community Resource Needs .   Engaged with patient by telephone for follow up visit in response to provider referral for social work chronic care management and care coordination services.   Consent to Services:  The patient was given information about Chronic Care Management services, agreed to services, and gave verbal consent prior to initiation of services.  Please see initial visit note for detailed documentation.   Patient agreed to services and consent obtained.   Assessment: Review of patient past medical history, allergies, medications, and health status, including review of relevant consultants reports was performed today as part of a comprehensive evaluation and provision of chronic care management and care coordination services.     SDOH (Social Determinants of Health) assessments and interventions performed:    Advanced Directives Status: Not addressed in this encounter.  CCM Care Plan  Allergies  Allergen Reactions   Diclofenac Anaphylaxis   Lamictal [Lamotrigine] Shortness Of Breath, Itching and Swelling    Tongue swells   Mobic [Meloxicam] Anaphylaxis   Ultram [Tramadol Hcl] Anaphylaxis   Nefazodone Itching and Swelling    Other reaction(s): Arthralgia (Joint Pain); Throat swelling, mouth sores   Betaseron [Interferon Beta-1b] Other (See Comments)    Caused avascular necrosis   Codeine Itching   Copaxone [Glatiramer Acetate] Other (See Comments)    Altered mental status: mental changes, anxiety, motion sickness   Gabapentin Itching    Itching if she takes more than '300mg'$    Latex     Latex band-aids  cause redness and tears your skin   Nitrofurantoin     Causing itching all over, tongue swelling, chest heaviness, swollen lips, cough, rapid HR, swollen eyelids, red eyes/gums, lips red/white per pt.   Oxcarbazepine Itching and Other (See Comments)    Dizziness, throat hurts   Teriflunomide Diarrhea    Hair loss, joint pains, elevated liver enzymes abugio (joint pains and liver issues)   Trazodone And Nefazodone     Mouth sores, tongue swelling   Tape Rash    Outpatient Encounter Medications as of 12/18/2020  Medication Sig Note   Ascorbic Acid (VITAMIN C) 1000 MG tablet Take 1,000 mg by mouth daily.    aspirin EC 81 MG tablet Take 81 mg by mouth daily.    baclofen (LIORESAL) 10 MG tablet Take 2 tablets (20 mg total) by mouth 3 (three) times daily.    BLACK ELDERBERRY PO Take 300 mg by mouth in the morning and at bedtime.    Cholecalciferol (VITAMIN D) 50 MCG (2000 UT) CAPS Take 2,000 Units by mouth daily.     CINNAMON PO Take 1,200 mg by mouth daily.     diphenhydrAMINE (BENADRYL) 25 MG tablet Take 25 mg by mouth 3 (three) times daily. Takes with Hydrocodone    doxepin (SINEQUAN) 10 MG capsule TAKE 1 TO 2 CAPSULES AT BEDTIME    EPINEPHrine 0.3 mg/0.3 mL IJ SOAJ injection Use as directed for life-threatening allergic reaction.    etodolac (LODINE) 500 MG tablet Take 500 mg by mouth. 1-2 times a day    famotidine (PEPCID) 20 MG tablet TAKE 1 TABLET BY MOUTH TWICE A DAY 11/20/2020:  Taking 1 tablet daily    Fexofenadine-Pseudoephedrine (ALLEGRA-D PO) Take by mouth in the morning.    Flaxseed, Linseed, (FLAX SEED OIL) 1000 MG CAPS Take 1,200 mg by mouth daily.     gabapentin (NEURONTIN) 300 MG capsule Take 1 capsule (300 mg total) by mouth 3 (three) times daily. 11/20/2020: Taking once or twice per day.    HYDROcodone-acetaminophen (NORCO/VICODIN) 5-325 MG tablet Take 1 tablet by mouth 3 (three) times daily as needed for moderate pain.    ipratropium (ATROVENT) 0.06 % nasal spray Use 2 sprays  each nostril bid prn drainage    L-Lysine 1000 MG TABS Take by mouth. As needed    Lactobacillus (PROBIOTIC ACIDOPHILUS PO) Take by mouth daily. Digestive Advantage BC   bacillus coagulans    levocetirizine (XYZAL) 5 MG tablet TAKE 1 TABLET BY MOUTH EVERY DAY IN THE EVENING    LORazepam (ATIVAN) 1 MG tablet Take 1 tablet (1 mg total) by mouth at bedtime.    Lutein-Bilberry (BILBERRY PLUS LUTEIN PO) Take by mouth daily.    Melatonin 10 MG TABS Take 1 tablet by mouth at bedtime.     methylphenidate (CONCERTA) 27 MG PO CR tablet Take 1 tablet (27 mg total) by mouth every morning.    Moringa 500 MG CAPS Take 2 capsules by mouth daily.    Multiple Vitamins-Minerals (HAIR/SKIN/NAILS/BIOTIN) TABS Take by mouth daily.     Nutritional Supplements (NUTRITIONAL SUPPLEMENT PO) Take 2.1 g by mouth in the morning and at bedtime. LION'S MANE    nystatin-triamcinolone ointment (MYCOLOG) Apply 1 application topically 2 (two) times daily.    Oxcarbazepine (TRILEPTAL) 300 MG tablet Take 300 mg by mouth 3 (three) times daily.    rizatriptan (MAXALT-MLT) 10 MG disintegrating tablet Take 1 tablet (10 mg total) by mouth as needed for migraine. May repeat in 2 hours if needed.  Max of 2 tablets in 24 hours, and 10 tablets per month    temazepam (RESTORIL) 15 MG capsule Take 1 capsule (15 mg total) by mouth at bedtime as needed for sleep.    timolol (BETIMOL) 0.5 % ophthalmic solution Place 1 drop into the right eye 2 (two) times daily. (Patient not taking: Reported on 12/17/2020) 11/20/2020: Instill 1 drop into the right eye one time daily.    timolol (TIMOPTIC) 0.5 % ophthalmic solution Place 1 drop into the right eye daily. (Patient not taking: Reported on 12/17/2020)    tiZANidine (ZANAFLEX) 2 MG tablet TAKE UP TO 4 PILLS A DAY    Vitamin D, Ergocalciferol, (DRISDOL) 1.25 MG (50000 UNIT) CAPS capsule ONE CAPSULE TWICE WEEKLY ON TUESDAYS/FRIDAYS    vitamin E 1000 UNIT capsule Take 1,000 Units by mouth daily.     vitamin  E 1000 UNIT capsule vitamin E 670 mg (1,000 unit) capsule   1 capsule every day by oral route.    zolpidem (AMBIEN) 10 MG tablet Take 10 mg by mouth at bedtime as needed. For sleep    No facility-administered encounter medications on file as of 12/18/2020.    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   Prediabetes 01/01/2020   Vitamin D  deficiency 01/01/2020   Vitamin B 12 deficiency 01/01/2020   At risk for diabetes mellitus 01/01/2020   Class 1 obesity with serious comorbidity and body mass index (BMI) of 32.0 to 32.9 in adult 01/01/2020   Anosmia 11/15/2019   Disturbance of smell and taste 11/15/2019   Epiretinal membrane, right 07/03/2019   Depression 03/06/2019   Diverticulosis 06/14/2018   Hepatic steatosis 06/14/2018   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   Decreased pedal pulses 06/30/2016   Pain in both lower extremities 06/30/2016   Facial pain, atypical 06/08/2016   Fibroids 02/12/2016   Chest pain 07/07/2015   Dizziness and giddiness 07/07/2015   Leiomyoma of uterus 07/07/2015   Lower abdominal pain 07/07/2015   Hip pain 03/03/2015   Optic neuritis 06/27/2014   Multiple sclerosis ([place]) 06/26/2014   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/26/2014   Other fatigue 06/26/2014   Dysesthesia 06/26/2014   Depression with anxiety 06/26/2014   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   Insomnia 06/26/2014   Low serum vitamin D 06/26/2014    Conditions to be addressed/monitored:  MS, Depression, Class 1 Obesity  Care Plan : Social Work [place]  Updates made by Daneen Schick since  12/18/2020 12:00 AM     Problem: Quality of Life (General Plan of Care)      Long-Range Goal: Quality of Life Maintained   Start Date: 10/15/2020  Priority: Medium  Note:   Current Barriers:  Chronic disease management support and education needs related to  MS, Depression, and Class 1 Obesity   Recent loss of spouse  Education officer, museum Clinical Goal(s):  patient will work with SW to identify and address any acute and/or chronic care coordination needs related to the self health management of  MS, Depression, and Class 1 Obesity   Patient will participate in grief share with local church to assist with building a support network following the loss of her spouse  SW Interventions:  Inter-disciplinary care team collaboration (see longitudinal plan of care) Collaboration with [name], MD regarding development and update of comprehensive plan of care as evidenced by provider attestation and co-signature Successful outbound call placed to the patient to assess for care coordination needs Discussed the patient did speak with social security about receiving spousal benefits - patient indicates she must complete all new paperwork in order to have current disability switched to "Disability Spousal Benefits" Determined Social Security was to send paperwork to the patients primary care provider and neurologist for completion Patient indicates she was also told she would need to see a specialist in order to have her memory assessed Advised the patient her neurologist can perform memory testing - patient states social security stated it must be a different provider. She believes they said a psychiatrist must perform and that her primary care provider would have to place a referral Advised the patient SW would collaborate with Dr. Baird Cancer to confirm receipt of paperwork from social security and inquire if she received any information on a needed referral Collaboration with Dr. Baird Cancer to request  confirmation of forms received as well as to discuss possible referral needed based on patient report Assessed for patients participation in grief share with her local church - the patient reports she is still attending the classes but is not doing the work like she should Discussed the patient does plan to participate in an 8 week grief program offered by [facility] which starts in September Patient reports she is struggling with motivation to get stuff done, she has spoken with her  neurologist about this as she wonders if this is due to depression, MS, or another medical condition Encouraged the patient to continue speaking with her providers regarding any concerns with her medical condition Scheduled follow up call over the next 90 days, encouraged the patient to contact SW as needed   Patient Goals/Self-Care Activities patient will:   -  Participate in grief share support group -Work with [place] to determine if eligible to receive survivors benefits -Contact SW as needed prior to next scheduled call  Follow Up Plan:  SW will follow up with the patient over the next 90 days       Follow Up Plan: SW will follow up with patient by phone over the next 90 days      Daneen Schick, BSW, CDP Social Worker, Certified Dementia Practitioner [place] [facility] [phone]

## 2020-12-18 NOTE — Patient Instructions (Signed)
Social Worker Visit Information  Goals we discussed today:   Goals Addressed             This Visit's Progress    Quality of Life Maintained   On track    Timeframe:  Long-Range Goal Priority:  Medium Start Date: 6.8.22                                          Next planned outreach: 11.4.22  Patient Goals/Self-Care Activities patient will:   - Participate in grief share support group -Work with Social Security to determine if eligible to receive survivors benefits -Contact SW as needed prior to next scheduled call         Follow Up Plan: SW will follow up with patient by phone over the next 90 days   Daneen Schick, BSW, CDP Social Worker, Certified Dementia Practitioner [place] [facility] [phone]

## 2020-12-23 ENCOUNTER — Telehealth: Payer: Self-pay | Admitting: Neurology

## 2020-12-23 DIAGNOSIS — F988 Other specified behavioral and emotional disorders with onset usually occurring in childhood and adolescence: Secondary | ICD-10-CM

## 2020-12-23 LAB — DRUG SCREEN, UR (12+OXYCODONE+CRT)
Amphetamine Scrn, Ur: NEGATIVE ng/mL
BARBITURATE SCREEN URINE: NEGATIVE ng/mL
BENZODIAZEPINE SCREEN, URINE: NEGATIVE ng/mL
CANNABINOIDS UR QL SCN: NEGATIVE ng/mL
Cocaine (Metab) Scrn, Ur: NEGATIVE ng/mL
Creatinine(Crt), U: 130.7 mg/dL (ref 20.0–300.0)
Fentanyl, Urine: NEGATIVE pg/mL
Meperidine Screen, Urine: NEGATIVE ng/mL
Methadone Screen, Urine: NEGATIVE ng/mL
OXYCODONE+OXYMORPHONE UR QL SCN: NEGATIVE ng/mL
Opiate Scrn, Ur: POSITIVE ng/mL — AB
Ph of Urine: 5.3 (ref 4.5–8.9)
Phencyclidine Qn, Ur: NEGATIVE ng/mL
Propoxyphene Scrn, Ur: NEGATIVE ng/mL
SPECIFIC GRAVITY: 1.019
Tramadol Screen, Urine: NEGATIVE ng/mL

## 2020-12-23 NOTE — Telephone Encounter (Signed)
Pt called stating her insurance needs a PA for the methylphenidate (CONCERTA) 27 MG PO CR tablet. Pt states she needs the extended release.

## 2020-12-23 NOTE — Telephone Encounter (Signed)
PA approved for the patient until 12/22/2023

## 2020-12-23 NOTE — Telephone Encounter (Signed)
Resubmitted through CMM/ [facility] [employer] under prescription plan. KEY:[id number] Will await for response

## 2020-12-23 NOTE — Telephone Encounter (Signed)
PA submitted through [facility]MM/[employer] [id number] Will wait for a response

## 2020-12-24 ENCOUNTER — Ambulatory Visit: Payer: Medicare Other | Admitting: Podiatry

## 2020-12-24 DIAGNOSIS — Z0271 Encounter for disability determination: Secondary | ICD-10-CM

## 2020-12-25 ENCOUNTER — Other Ambulatory Visit: Payer: Self-pay | Admitting: Neurology

## 2021-01-10 ENCOUNTER — Other Ambulatory Visit: Payer: Self-pay | Admitting: Neurology

## 2021-01-20 ENCOUNTER — Other Ambulatory Visit: Payer: Self-pay

## 2021-01-20 MED ORDER — METHYLPHENIDATE HCL ER (OSM) 27 MG PO TBCR: 27.0000 mg | EXTENDED_RELEASE_TABLET | ORAL | 0 refills | Status: DC

## 2021-01-20 MED ORDER — HYDROCODONE-ACETAMINOPHEN 5-325 MG PO TABS: 1.0000 | ORAL_TABLET | Freq: Three times a day (TID) | ORAL | 0 refills | Status: DC | PRN

## 2021-01-21 ENCOUNTER — Ambulatory Visit (INDEPENDENT_AMBULATORY_CARE_PROVIDER_SITE_OTHER): Payer: Medicare Other

## 2021-01-21 ENCOUNTER — Telehealth: Payer: Medicare Other

## 2021-01-21 DIAGNOSIS — F3342 Major depressive disorder, recurrent, in full remission: Secondary | ICD-10-CM

## 2021-01-21 DIAGNOSIS — E6609 Other obesity due to excess calories: Secondary | ICD-10-CM

## 2021-01-21 DIAGNOSIS — E559 Vitamin D deficiency, unspecified: Secondary | ICD-10-CM

## 2021-01-21 DIAGNOSIS — G35 Multiple sclerosis: Secondary | ICD-10-CM

## 2021-01-21 DIAGNOSIS — Z6834 Body mass index (BMI) 34.0-34.9, adult: Secondary | ICD-10-CM

## 2021-01-21 DIAGNOSIS — R7303 Prediabetes: Secondary | ICD-10-CM

## 2021-01-21 DIAGNOSIS — E66811 Obesity, class 1: Secondary | ICD-10-CM

## 2021-01-26 NOTE — Patient Instructions (Signed)
Visit Information  PATIENT GOALS:  Goals Addressed      Keep Pain Under Control-Multiople Sclerosis   On track    Timeframe:  Long-Range Goal Priority:  High Start Date:  04/21/20                           Expected End Date:  04/21/21                    Follow Up Date: 04/30/21    - call for prescription refill 3 days before needed - plan exercise or activity when pain is best controlled - prioritize tasks for the day    Why is this important?   Day-to-day life can be hard when your loved one/you have pain.  Even a small change in emotion or a physical problem can make pain better or worse.  Coping with pain depends on how the mind and body reacts to pain.  Pain medicine is just one piece of the treatment puzzle.  There are many tools to help manage pain. A combination of them can be used to best meet your loved one's/your needs.     Notes:      Maintain My Strength-Multiple Sclerosis   On track    Timeframe:  Long-Range Goal Priority:  High Start Date: 04/21/20                            Expected End Date: 04/21/21                     Follow Up Date: 04/30/21   - set a daily activity goal - start slowly and increase the amount of time every week  - keep all scheduled MD f/u with Neurologist - report early signs or symptoms of exacerbation promptly to MD - avoid trigger known to trigger MS exacerbation    Why is this important?   Walking and easy exercises make your loved one/you stronger. They also increase energy.  Every Madora Barletta bit helps, at any age.    Notes:      Manage Glaucoma       Timeframe:  Long-Range Goal Priority:  High Start Date:  04/21/20                           Expected End Date:  04/21/21       Follow up date: 04/30/21   -keep all scheduled appointments with Eye Specialist -follow eye care as directed by Eye Specialist -report new or worsening symptoms or sudden change in vision                  Set My Weight Loss Goal       Timeframe:   Long-Range Goal Priority:  High Start Date:  04/21/20                           Expected End Date:  04/21/21                     Follow Up Date: 04/30/21   - set weight loss goal    Why is this important?   Losing only 5 to 15 percent of your weight makes a big difference in your health.    Notes:  The patient verbalized understanding of instructions, educational materials, and care plan provided today and declined offer to receive copy of patient instructions, educational materials, and care plan.   Telephone follow up appointment with care management team member scheduled for: 04/30/21  Barb Merino, RN, BSN, CCM Care Management Coordinator [facility]  Direct Phone: [phone]

## 2021-01-26 NOTE — Chronic Care Management (AMB) (Signed)
Chronic Care Management   CCM RN Visit Note  01/21/2021 Name: Fontaine Stavely MRN: [id number] DOB: Jun 01, 1960  Subjective: Grace [name] is a 60 y.o. year old female who is a primary care patient of [name], MD. The care management team was consulted for assistance with disease management and care coordination needs.    Engaged with patient by telephone for follow up visit in response to provider referral for case management and/or care coordination services.   Consent to Services:  The patient was given information about Chronic Care Management services, agreed to services, and gave verbal consent prior to initiation of services.  Please see initial visit note for detailed documentation.   Patient agreed to services and verbal consent obtained.   Assessment: Review of patient past medical history, allergies, medications, health status, including review of consultants reports, laboratory and other test data, was performed as part of comprehensive evaluation and provision of chronic care management services.   SDOH (Social Determinants of Health) assessments and interventions performed:  Yes, no acute challenges   CCM Care Plan  Allergies  Allergen Reactions   Diclofenac Anaphylaxis   Lamictal [Lamotrigine] Shortness Of Breath, Itching and Swelling    Tongue swells   Mobic [Meloxicam] Anaphylaxis   Ultram [Tramadol Hcl] Anaphylaxis   Nefazodone Itching and Swelling    Other reaction(s): Arthralgia (Joint Pain); Throat swelling, mouth sores   Betaseron [Interferon Beta-1b] Other (See Comments)    Caused avascular necrosis   Codeine Itching   Copaxone [Glatiramer Acetate] Other (See Comments)    Altered mental status: mental changes, anxiety, motion sickness   Gabapentin Itching    Itching if she takes more than '300mg'$    Latex     Latex band-aids cause redness and tears your skin   Nitrofurantoin     Causing itching all over, tongue swelling, chest  heaviness, swollen lips, cough, rapid HR, swollen eyelids, red eyes/gums, lips red/white per pt.   Oxcarbazepine Itching and Other (See Comments)    Dizziness, throat hurts   Teriflunomide Diarrhea    Hair loss, joint pains, elevated liver enzymes abugio (joint pains and liver issues)   Trazodone And Nefazodone     Mouth sores, tongue swelling   Tape Rash    Outpatient Encounter Medications as of 01/21/2021  Medication Sig Note   Ascorbic Acid (VITAMIN C) 1000 MG tablet Take 1,000 mg by mouth daily.    aspirin EC 81 MG tablet Take 81 mg by mouth daily.    baclofen (LIORESAL) 10 MG tablet Take 2 tablets (20 mg total) by mouth 3 (three) times daily.    BLACK ELDERBERRY PO Take 300 mg by mouth in the morning and at bedtime.    Cholecalciferol (VITAMIN D) 50 MCG (2000 UT) CAPS Take 2,000 Units by mouth daily.     CINNAMON PO Take 1,200 mg by mouth daily.     diphenhydrAMINE (BENADRYL) 25 MG tablet Take 25 mg by mouth 3 (three) times daily. Takes with Hydrocodone    doxepin (SINEQUAN) 10 MG capsule TAKE 1 TO 2 CAPSULES AT BEDTIME    EPINEPHrine 0.3 mg/0.3 mL IJ SOAJ injection Use as directed for life-threatening allergic reaction.    etodolac (LODINE) 500 MG tablet Take 500 mg by mouth. 1-2 times a day    famotidine (PEPCID) 20 MG tablet TAKE 1 TABLET BY MOUTH TWICE A DAY 11/20/2020: Taking 1 tablet daily    Fexofenadine-Pseudoephedrine (ALLEGRA-D PO) Take by mouth in the morning.    Flaxseed, Linseed, (  FLAX SEED OIL) 1000 MG CAPS Take 1,200 mg by mouth daily.     gabapentin (NEURONTIN) 300 MG capsule Take 1 capsule (300 mg total) by mouth 3 (three) times daily. 11/20/2020: Taking once or twice per day.    HYDROcodone-acetaminophen (NORCO/VICODIN) 5-325 MG tablet Take 1 tablet by mouth 3 (three) times daily as needed for moderate pain.    ipratropium (ATROVENT) 0.06 % nasal spray Use 2 sprays each nostril bid prn drainage    L-Lysine 1000 MG TABS Take by mouth. As needed    Lactobacillus  (PROBIOTIC ACIDOPHILUS PO) Take by mouth daily. Digestive Advantage BC   bacillus coagulans    levocetirizine (XYZAL) 5 MG tablet TAKE 1 TABLET BY MOUTH EVERY DAY IN THE EVENING    LORazepam (ATIVAN) 1 MG tablet Take 1 tablet (1 mg total) by mouth at bedtime.    Lutein-Bilberry (BILBERRY PLUS LUTEIN PO) Take by mouth daily.    Melatonin 10 MG TABS Take 1 tablet by mouth at bedtime.     methylphenidate (CONCERTA) 27 MG PO CR tablet Take 1 tablet (27 mg total) by mouth every morning.    Moringa 500 MG CAPS Take 2 capsules by mouth daily.    Multiple Vitamins-Minerals (HAIR/SKIN/NAILS/BIOTIN) TABS Take by mouth daily.     Nutritional Supplements (NUTRITIONAL SUPPLEMENT PO) Take 2.1 g by mouth in the morning and at bedtime. LION'S MANE    nystatin-triamcinolone ointment (MYCOLOG) Apply 1 application topically 2 (two) times daily.    Oxcarbazepine (TRILEPTAL) 300 MG tablet Take 300 mg by mouth 3 (three) times daily.    rizatriptan (MAXALT-MLT) 10 MG disintegrating tablet Take 1 tablet (10 mg total) by mouth as needed for migraine. May repeat in 2 hours if needed.  Max of 2 tablets in 24 hours, and 10 tablets per month    temazepam (RESTORIL) 15 MG capsule Take 1 capsule (15 mg total) by mouth at bedtime as needed for sleep.    timolol (BETIMOL) 0.5 % ophthalmic solution Place 1 drop into the right eye 2 (two) times daily. (Patient not taking: Reported on 12/17/2020) 11/20/2020: Instill 1 drop into the right eye one time daily.    timolol (TIMOPTIC) 0.5 % ophthalmic solution Place 1 drop into the right eye daily. (Patient not taking: Reported on 12/17/2020)    tiZANidine (ZANAFLEX) 2 MG tablet TAKE UP TO 4 PILLS A DAY    Vitamin D, Ergocalciferol, (DRISDOL) 1.25 MG (50000 UNIT) CAPS capsule ONE CAPSULE TWICE WEEKLY ON TUESDAYS/FRIDAYS    vitamin E 1000 UNIT capsule Take 1,000 Units by mouth daily.     vitamin E 1000 UNIT capsule vitamin E 670 mg (1,000 unit) capsule   1 capsule every day by oral route.     zolpidem (AMBIEN) 10 MG tablet Take 10 mg by mouth at bedtime as needed. For sleep    No facility-administered encounter medications on file as of 01/21/2021.    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   Prediabetes 01/01/2020   Vitamin D deficiency 01/01/2020   Vitamin B 12 deficiency 01/01/2020   At  risk for diabetes mellitus 01/01/2020   Class 1 obesity with serious comorbidity and body mass index (BMI) of 32.0 to 32.9 in adult 01/01/2020   Anosmia 11/15/2019   Disturbance of smell and taste 11/15/2019   Epiretinal membrane, right 07/03/2019   Depression 03/06/2019   Diverticulosis 06/14/2018   Hepatic steatosis 06/14/2018   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   Decreased pedal pulses 06/30/2016   Pain in both lower extremities 06/30/2016   Facial pain, atypical 06/08/2016   Fibroids 02/12/2016   Chest pain 07/07/2015   Dizziness and giddiness 07/07/2015   Leiomyoma of uterus 07/07/2015   Lower abdominal pain 07/07/2015   Hip pain 03/03/2015   Optic neuritis 06/27/2014   Multiple sclerosis ([place]) 06/26/2014   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/26/2014   Other fatigue 06/26/2014   Dysesthesia 06/26/2014   Depression with anxiety 06/26/2014   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   Insomnia 06/26/2014   Low serum vitamin D 06/26/2014    Conditions to be addressed/monitored: MS, Vitamin D Deficiency, Recurrent major depression, Class 1 Obesity due to excess calories, Prediabetes  Care Plan : Multiple Sclerosis (Adult)   Updates made by Lynne Logan, RN since 01/21/2021 12:00 AM     Problem: Mobility and Independence      Long-Range Goal: Mobility and Independence Optimized   Start Date: 04/21/2020  Expected End Date: 04/21/2021  Recent Progress: On track  Priority: High  Note:   Current Barriers:  Ineffective Self Health Maintenance Currently UNABLE TO independently self manage needs related to chronic health conditions.  Knowledge Deficits related to short term plan for care coordination needs and long term plans for chronic disease management needs Nurse Case Manager Clinical Goal(s):  Patient will work with care management team to address care coordination and chronic disease management needs related to Disease Management Educational Needs Care Coordination Medication Management and Education Psychosocial Support   Interventions:  01/21/21 completed successful outbound call with patient  Collaboration with [name], MD regarding development and update of comprehensive plan of care as evidenced by provider attestation and co-signature Inter-disciplinary care team collaboration (see longitudinal plan of care) Assessed for patient adherence to MD recommendations for RRMS  Review of patient status, including review of consultant's reports, relevant laboratory and other test results, and medications completed. Reviewed medications with patient and discussed importance of medication adherence Assessed for patient ability to perform activities of daily living and required amount of assistance needed for safety, optimal independence and self-care. Assessed for barriers to participation in therapy or exercise such as pain with activity, anticipated or imagined pain, transportation, depression or fear.  Reviewed and discussed recent follow up with Neurologist, Dr. Felecia Shelling on 12/17/20 with the following Assessment/Plan noted:  ASSESSMENT AND PLAN   Multiple sclerosis ([place]) - Plan: Drug Screen, Ur  (12+Oxycodone+Crt)   Chronic prescription opiate use - Plan: Drug Screen, Ur (12+Oxycodone+Crt)   Numbness   Ataxic gait   Depression with anxiety   Other fatigue     1.   She will continue off a DMT for now.  Due to s.e. she prefers not to restart even though the last MRI of the cervical spine did show 1 lesion not present on a previous MRI from 2016 (plus two older lesions) 2    Concerta 27 mg for fatigue and cognition.   3. Continue Hydrocodone 5 mg up to 3 times a day for pain.  The PDMP was reviewed and she is not getting opiates from other doctors.  She does not show  any drug-seeking behavior.  We will check a urine drug screen today. 4.   She will return to see me in 4 months or sooner if she has new or worsening neurologic symptoms.    Richard A. Felecia Shelling, MD, PhD [id number], [id number] PM Certified in Neurology, Clinical Neurophysiology, Sleep Medicine, Pain Medicine and Neuroimaging   [facility] [address] [place], [place] [address] [phone]         Instructions  Return in about 4 months (around 04/18/2021). Determined patient verbalizes understanding of her prescribed treatment plan and when to call the doctor if needed for disease exacerbation  Discussed plans with patient for ongoing care management follow up and provided patient with direct contact information for care management team Self-Care Activities:   - set a daily activity goal - start slowly and increase the amount of time every week - keep all scheduled MD f/u with Neurologist - report early signs or symptoms of exacerbation promptly to MD - avoid trigger known to trigger MS exacerbation   Follow Up Plan: Telephone follow up appointment with care management team member scheduled for:  04/30/21   Problem: Symptom Relapse      Long-Range Goal: Symptom Relapse Prevented or Managed   Start Date: 04/21/2020  Expected End Date: 04/21/2021  Recent Progress: On track   Priority: High  Note:   .Current Barriers:  Ineffective Self Health Maintenance Currently UNABLE TO independently self manage needs related to chronic health conditions.  Knowledge Deficits related to short term plan for care coordination needs and long term plans for chronic disease management needs Nurse Case Manager Clinical Goal(s):  Patient will work with care management team to address care coordination and chronic disease management needs related to Disease Management Educational Needs Care Coordination Medication Management and Education Psychosocial Support   Interventions:  01/21/21 completed successful call with patient  Collaboration with [name], MD regarding development and update of comprehensive plan of care as evidenced by provider attestation and co-signature Inter-disciplinary care team collaboration (see longitudinal plan of care) Provided education to patient about basic disease process related to relapsing remitting Multiple Sclerosis  Review of patient status, including review of consultant's reports, relevant laboratory and other test results, and medications completed. Reviewed medications with patient and discussed importance of medication adherence Assessed for signs of disease exacerbation; Educated on potential triggers of MS exacerbation including infection, increase in stress/anxiety and or disease progression; Educated on symptoms suggestive of MS exacerbation Discussed plans with patient for ongoing care management follow up and provided patient with direct contact information for care management team Patient Goals/Self Care Activities - call for prescription refill 3 days before needed - plan exercise or activity when pain is best controlled - prioritize tasks for the day  Follow Up Plan: Telephone follow up appointment with care management team member scheduled for: 04/30/21   Plan:Telephone follow up appointment with care management team member  scheduled for:  04/30/21  Barb Merino, RN, BSN, CCM Care Management Coordinator [place] [facility]  Direct Phone: [phone]

## 2021-01-29 ENCOUNTER — Telehealth: Payer: Self-pay

## 2021-01-29 NOTE — Chronic Care Management (AMB) (Signed)
Chronic Care Management Pharmacy Assistant   Name: Grace [name]  MRN: [id number] DOB: 08-20-1960  Reason for Encounter: Disease State/ General  Recent office visits:  12-18-2020 Daneen Schick (CCM)  01-21-2021 Little, Claudette Stapler, RN (CCM)  Recent consult visits:  11-26-2020 Felipa Furnace, DPM (Podiatry). Order placed for DG of left foot  12-08-2020 Bond, Tracie Harrier, MD (Ophthalmology). Glaucoma evaluation.  12-17-2020 Sater, Nanine Means, MD (Neurology). START Concerta 27 mg daily. Positive opioid screening   Hospital visits:  None in previous 6 months  Medications: Outpatient Encounter Medications as of 01/29/2021  Medication Sig Note   Ascorbic Acid (VITAMIN C) 1000 MG tablet Take 1,000 mg by mouth daily.    aspirin EC 81 MG tablet Take 81 mg by mouth daily.    baclofen (LIORESAL) 10 MG tablet Take 2 tablets (20 mg total) by mouth 3 (three) times daily.    BLACK ELDERBERRY PO Take 300 mg by mouth in the morning and at bedtime.    Cholecalciferol (VITAMIN D) 50 MCG (2000 UT) CAPS Take 2,000 Units by mouth daily.     CINNAMON PO Take 1,200 mg by mouth daily.     diphenhydrAMINE (BENADRYL) 25 MG tablet Take 25 mg by mouth 3 (three) times daily. Takes with Hydrocodone    doxepin (SINEQUAN) 10 MG capsule TAKE 1 TO 2 CAPSULES AT BEDTIME    EPINEPHrine 0.3 mg/0.3 mL IJ SOAJ injection Use as directed for life-threatening allergic reaction.    etodolac (LODINE) 500 MG tablet Take 500 mg by mouth. 1-2 times a day    famotidine (PEPCID) 20 MG tablet TAKE 1 TABLET BY MOUTH TWICE A DAY 11/20/2020: Taking 1 tablet daily    Fexofenadine-Pseudoephedrine (ALLEGRA-D PO) Take by mouth in the morning.    Flaxseed, Linseed, (FLAX SEED OIL) 1000 MG CAPS Take 1,200 mg by mouth daily.     gabapentin (NEURONTIN) 300 MG capsule Take 1 capsule (300 mg total) by mouth 3 (three) times daily. 11/20/2020: Taking once or twice per day.    HYDROcodone-acetaminophen (NORCO/VICODIN) 5-325 MG tablet  Take 1 tablet by mouth 3 (three) times daily as needed for moderate pain.    ipratropium (ATROVENT) 0.06 % nasal spray Use 2 sprays each nostril bid prn drainage    L-Lysine 1000 MG TABS Take by mouth. As needed    Lactobacillus (PROBIOTIC ACIDOPHILUS PO) Take by mouth daily. Digestive Advantage BC   bacillus coagulans    levocetirizine (XYZAL) 5 MG tablet TAKE 1 TABLET BY MOUTH EVERY DAY IN THE EVENING    LORazepam (ATIVAN) 1 MG tablet Take 1 tablet (1 mg total) by mouth at bedtime.    Lutein-Bilberry (BILBERRY PLUS LUTEIN PO) Take by mouth daily.    Melatonin 10 MG TABS Take 1 tablet by mouth at bedtime.     methylphenidate (CONCERTA) 27 MG PO CR tablet Take 1 tablet (27 mg total) by mouth every morning.    Moringa 500 MG CAPS Take 2 capsules by mouth daily.    Multiple Vitamins-Minerals (HAIR/SKIN/NAILS/BIOTIN) TABS Take by mouth daily.     Nutritional Supplements (NUTRITIONAL SUPPLEMENT PO) Take 2.1 g by mouth in the morning and at bedtime. LION'S MANE    nystatin-triamcinolone ointment (MYCOLOG) Apply 1 application topically 2 (two) times daily.    Oxcarbazepine (TRILEPTAL) 300 MG tablet Take 300 mg by mouth 3 (three) times daily.    rizatriptan (MAXALT-MLT) 10 MG disintegrating tablet Take 1 tablet (10 mg total) by mouth as needed for migraine. May  repeat in 2 hours if needed.  Max of 2 tablets in 24 hours, and 10 tablets per month    temazepam (RESTORIL) 15 MG capsule Take 1 capsule (15 mg total) by mouth at bedtime as needed for sleep.    timolol (BETIMOL) 0.5 % ophthalmic solution Place 1 drop into the right eye 2 (two) times daily. (Patient not taking: Reported on 12/17/2020) 11/20/2020: Instill 1 drop into the right eye one time daily.    timolol (TIMOPTIC) 0.5 % ophthalmic solution Place 1 drop into the right eye daily. (Patient not taking: Reported on 12/17/2020)    tiZANidine (ZANAFLEX) 2 MG tablet TAKE UP TO 4 PILLS A DAY    Vitamin D, Ergocalciferol, (DRISDOL) 1.25 MG (50000 UNIT)  CAPS capsule ONE CAPSULE TWICE WEEKLY ON TUESDAYS/FRIDAYS    vitamin E 1000 UNIT capsule Take 1,000 Units by mouth daily.     vitamin E 1000 UNIT capsule vitamin E 670 mg (1,000 unit) capsule   1 capsule every day by oral route.    zolpidem (AMBIEN) 10 MG tablet Take 10 mg by mouth at bedtime as needed. For sleep    No facility-administered encounter medications on file as of 01/29/2021.   01-29-2021: 1st attempt left VM 02-03-2021: 2nd attempt left VM 02-04-2021: 3rd attempt left VM  Care Gaps: Shingrix overdue Last AWV 10-16-2020 next 10-21-2021 Covid vaccine overdue Flu vaccine overdue  Star Rating Drugs: None  Jeannette How [facility] Clinical Pharmacist Assistant [phone]

## 2021-02-06 DIAGNOSIS — F3342 Major depressive disorder, recurrent, in full remission: Secondary | ICD-10-CM | POA: Diagnosis not present

## 2021-03-13 ENCOUNTER — Ambulatory Visit (INDEPENDENT_AMBULATORY_CARE_PROVIDER_SITE_OTHER): Payer: Medicare Other

## 2021-03-13 ENCOUNTER — Telehealth: Payer: Self-pay

## 2021-03-13 DIAGNOSIS — E559 Vitamin D deficiency, unspecified: Secondary | ICD-10-CM

## 2021-03-13 DIAGNOSIS — R7303 Prediabetes: Secondary | ICD-10-CM

## 2021-03-13 DIAGNOSIS — F418 Other specified anxiety disorders: Secondary | ICD-10-CM

## 2021-03-13 DIAGNOSIS — G35D Multiple sclerosis, unspecified: Secondary | ICD-10-CM

## 2021-03-13 DIAGNOSIS — E66811 Obesity, class 1: Secondary | ICD-10-CM

## 2021-03-13 DIAGNOSIS — F3342 Major depressive disorder, recurrent, in full remission: Secondary | ICD-10-CM

## 2021-03-13 DIAGNOSIS — G35 Multiple sclerosis: Secondary | ICD-10-CM

## 2021-03-13 DIAGNOSIS — E6609 Other obesity due to excess calories: Secondary | ICD-10-CM

## 2021-03-13 NOTE — Chronic Care Management (AMB) (Signed)
Chronic Care Management Pharmacy Assistant   Name: Grace [name]  MRN: [id number] DOB: [birth year]  Reason for Encounter: Patient Assistance Coordination  03/13/2021- Called [employer] to check on status of Epi-pen application that was sent in July, per representative, patient does not qualify for the program due to have prescription coverage. Researched another option for patient assistance, filled out application with [employer]. Called patient to inform I will mail application for patient to sign and return to Dr Baird [facility] office, to fax application for assistance, Patient aware, sent Provider page of application to PCP office for signature. Patient will call when she receives forms.   Medications: Outpatient Encounter Medications as of 03/13/2021  Medication Sig Note   Ascorbic Acid (VITAMIN C) 1000 MG tablet Take 1,000 mg by mouth daily.    aspirin EC 81 MG tablet Take 81 mg by mouth daily.    baclofen (LIORESAL) 10 MG tablet Take 2 tablets (20 mg total) by mouth 3 (three) times daily.    BLACK ELDERBERRY PO Take 300 mg by mouth in the morning and at bedtime.    Cholecalciferol (VITAMIN D) 50 MCG (2000 UT) CAPS Take 2,000 Units by mouth daily.     CINNAMON PO Take 1,200 mg by mouth daily.     diphenhydrAMINE (BENADRYL) 25 MG tablet Take 25 mg by mouth 3 (three) times daily. Takes with Hydrocodone    doxepin (SINEQUAN) 10 MG capsule TAKE 1 TO 2 CAPSULES AT BEDTIME    EPINEPHrine 0.3 mg/0.3 mL IJ SOAJ injection Use as directed for life-threatening allergic reaction.    etodolac (LODINE) 500 MG tablet Take 500 mg by mouth. 1-2 times a day    famotidine (PEPCID) 20 MG tablet TAKE 1 TABLET BY MOUTH TWICE A DAY 11/20/2020: Taking 1 tablet daily    Fexofenadine-Pseudoephedrine (ALLEGRA-D PO) Take by mouth in the morning.    Flaxseed, Linseed, (FLAX SEED OIL) 1000 MG CAPS Take 1,200 mg by mouth daily.     gabapentin (NEURONTIN) 300 MG capsule Take 1 capsule (300 mg  total) by mouth 3 (three) times daily. 11/20/2020: Taking once or twice per day.    HYDROcodone-acetaminophen (NORCO/VICODIN) 5-325 MG tablet Take 1 tablet by mouth 3 (three) times daily as needed for moderate pain.    ipratropium (ATROVENT) 0.06 % nasal spray Use 2 sprays each nostril bid prn drainage    L-Lysine 1000 MG TABS Take by mouth. As needed    Lactobacillus (PROBIOTIC ACIDOPHILUS PO) Take by mouth daily. Digestive Advantage BC   bacillus coagulans    levocetirizine (XYZAL) 5 MG tablet TAKE 1 TABLET BY MOUTH EVERY DAY IN THE EVENING    LORazepam (ATIVAN) 1 MG tablet Take 1 tablet (1 mg total) by mouth at bedtime.    Lutein-Bilberry (BILBERRY PLUS LUTEIN PO) Take by mouth daily.    Melatonin 10 MG TABS Take 1 tablet by mouth at bedtime.     methylphenidate (CONCERTA) 27 MG PO CR tablet Take 1 tablet (27 mg total) by mouth every morning.    Moringa 500 MG CAPS Take 2 capsules by mouth daily.    Multiple Vitamins-Minerals (HAIR/SKIN/NAILS/BIOTIN) TABS Take by mouth daily.     Nutritional Supplements (NUTRITIONAL SUPPLEMENT PO) Take 2.1 g by mouth in the morning and at bedtime. LION'S MANE    nystatin-triamcinolone ointment (MYCOLOG) Apply 1 application topically 2 (two) times daily.    Oxcarbazepine (TRILEPTAL) 300 MG tablet Take 300 mg by mouth 3 (three) times daily.    rizatriptan (  MAXALT-MLT) 10 MG disintegrating tablet Take 1 tablet (10 mg total) by mouth as needed for migraine. May repeat in 2 hours if needed.  Max of 2 tablets in 24 hours, and 10 tablets per month    temazepam (RESTORIL) 15 MG capsule Take 1 capsule (15 mg total) by mouth at bedtime as needed for sleep.    timolol (BETIMOL) 0.5 % ophthalmic solution Place 1 drop into the right eye 2 (two) times daily. (Patient not taking: Reported on 12/17/2020) 11/20/2020: Instill 1 drop into the right eye one time daily.    timolol (TIMOPTIC) 0.5 % ophthalmic solution Place 1 drop into the right eye daily. (Patient not taking: Reported  on 12/17/2020)    tiZANidine (ZANAFLEX) 2 MG tablet TAKE UP TO 4 PILLS A DAY    Vitamin D, Ergocalciferol, (DRISDOL) 1.25 MG (50000 UNIT) CAPS capsule ONE CAPSULE TWICE WEEKLY ON TUESDAYS/FRIDAYS    vitamin E 1000 UNIT capsule Take 1,000 Units by mouth daily.     vitamin E 1000 UNIT capsule vitamin E 670 mg (1,000 unit) capsule   1 capsule every day by oral route.    zolpidem (AMBIEN) 10 MG tablet Take 10 mg by mouth at bedtime as needed. For sleep    No facility-administered encounter medications on file as of 03/13/2021.   Pattricia Boss, [place] Pharmacist Assistant [phone]

## 2021-03-13 NOTE — Chronic Care Management (AMB) (Signed)
Chronic Care Management    Social Work Note  03/13/2021 Name: Grace [name] MRN: [id number] DOB: Aug 01, 1960  Grace [name] is a 60 y.o. year old female who is a primary care patient of [name], MD. The CCM team was consulted to assist the patient with chronic disease management and/or care coordination needs related to:  MS, Recurrent Major Depression, Prediabetes, Vitamin D Deficiency, Class 1 Obesity .   Engaged with patient by telephone for follow up visit in response to provider referral for social work chronic care management and care coordination services.   Consent to Services:  The patient was given information about Chronic Care Management services, agreed to services, and gave verbal consent prior to initiation of services.  Please see initial visit note for detailed documentation.   Patient agreed to services and consent obtained.   Assessment: Review of patient past medical history, allergies, medications, and health status, including review of relevant consultants reports was performed today as part of a comprehensive evaluation and provision of chronic care management and care coordination services.     SDOH (Social Determinants of Health) assessments and interventions performed:  SDOH Interventions    Flowsheet Row Most Recent Value  SDOH Interventions   Food Insecurity Interventions Other (Comment)  [mailed information about local food pantry's/community meals with church groups]  Housing Interventions Intervention Not Indicated        Advanced Directives Status: Not addressed in this encounter.  CCM Care Plan  Allergies  Allergen Reactions   Diclofenac Anaphylaxis   Lamictal [Lamotrigine] Shortness Of Breath, Itching and Swelling    Tongue swells   Mobic [Meloxicam] Anaphylaxis   Ultram [Tramadol Hcl] Anaphylaxis   Nefazodone Itching and Swelling    Other reaction(s): Arthralgia (Joint Pain); Throat swelling, mouth sores   Betaseron  [Interferon Beta-1b] Other (See Comments)    Caused avascular necrosis   Codeine Itching   Copaxone [Glatiramer Acetate] Other (See Comments)    Altered mental status: mental changes, anxiety, motion sickness   Gabapentin Itching    Itching if she takes more than 300mg    Latex     Latex band-aids cause redness and tears your skin   Nitrofurantoin     Causing itching all over, tongue swelling, chest heaviness, swollen lips, cough, rapid HR, swollen eyelids, red eyes/gums, lips red/white per pt.   Oxcarbazepine Itching and Other (See Comments)    Dizziness, throat hurts   Teriflunomide Diarrhea    Hair loss, joint pains, elevated liver enzymes abugio (joint pains and liver issues)   Trazodone And Nefazodone     Mouth sores, tongue swelling   Tape Rash    Outpatient Encounter Medications as of 03/13/2021  Medication Sig Note   Ascorbic Acid (VITAMIN C) 1000 MG tablet Take 1,000 mg by mouth daily.    aspirin EC 81 MG tablet Take 81 mg by mouth daily.    baclofen (LIORESAL) 10 MG tablet Take 2 tablets (20 mg total) by mouth 3 (three) times daily.    BLACK ELDERBERRY PO Take 300 mg by mouth in the morning and at bedtime.    Cholecalciferol (VITAMIN D) 50 MCG (2000 UT) CAPS Take 2,000 Units by mouth daily.     CINNAMON PO Take 1,200 mg by mouth daily.     diphenhydrAMINE (BENADRYL) 25 MG tablet Take 25 mg by mouth 3 (three) times daily. Takes with Hydrocodone    doxepin (SINEQUAN) 10 MG capsule TAKE 1 TO 2 CAPSULES AT BEDTIME    EPINEPHrine  0.3 mg/0.3 mL IJ SOAJ injection Use as directed for life-threatening allergic reaction.    etodolac (LODINE) 500 MG tablet Take 500 mg by mouth. 1-2 times a day    famotidine (PEPCID) 20 MG tablet TAKE 1 TABLET BY MOUTH TWICE A DAY 11/20/2020: Taking 1 tablet daily    Fexofenadine-Pseudoephedrine (ALLEGRA-D PO) Take by mouth in the morning.    Flaxseed, Linseed, (FLAX SEED OIL) 1000 MG CAPS Take 1,200 mg by mouth daily.     gabapentin (NEURONTIN) 300 MG  capsule Take 1 capsule (300 mg total) by mouth 3 (three) times daily. 11/20/2020: Taking once or twice per day.    HYDROcodone-acetaminophen (NORCO/VICODIN) 5-325 MG tablet Take 1 tablet by mouth 3 (three) times daily as needed for moderate pain.    ipratropium (ATROVENT) 0.06 % nasal spray Use 2 sprays each nostril bid prn drainage    L-Lysine 1000 MG TABS Take by mouth. As needed    Lactobacillus (PROBIOTIC ACIDOPHILUS PO) Take by mouth daily. Digestive Advantage BC   bacillus coagulans    levocetirizine (XYZAL) 5 MG tablet TAKE 1 TABLET BY MOUTH EVERY DAY IN THE EVENING    LORazepam (ATIVAN) 1 MG tablet Take 1 tablet (1 mg total) by mouth at bedtime.    Lutein-Bilberry (BILBERRY PLUS LUTEIN PO) Take by mouth daily.    Melatonin 10 MG TABS Take 1 tablet by mouth at bedtime.     methylphenidate (CONCERTA) 27 MG PO CR tablet Take 1 tablet (27 mg total) by mouth every morning.    Moringa 500 MG CAPS Take 2 capsules by mouth daily.    Multiple Vitamins-Minerals (HAIR/SKIN/NAILS/BIOTIN) TABS Take by mouth daily.     Nutritional Supplements (NUTRITIONAL SUPPLEMENT PO) Take 2.1 g by mouth in the morning and at bedtime. LION'S MANE    nystatin-triamcinolone ointment (MYCOLOG) Apply 1 application topically 2 (two) times daily.    Oxcarbazepine (TRILEPTAL) 300 MG tablet Take 300 mg by mouth 3 (three) times daily.    rizatriptan (MAXALT-MLT) 10 MG disintegrating tablet Take 1 tablet (10 mg total) by mouth as needed for migraine. May repeat in 2 hours if needed.  Max of 2 tablets in 24 hours, and 10 tablets per month    temazepam (RESTORIL) 15 MG capsule Take 1 capsule (15 mg total) by mouth at bedtime as needed for sleep.    timolol (BETIMOL) 0.5 % ophthalmic solution Place 1 drop into the right eye 2 (two) times daily. (Patient not taking: Reported on 12/17/2020) 11/20/2020: Instill 1 drop into the right eye one time daily.    timolol (TIMOPTIC) 0.5 % ophthalmic solution Place 1 drop into the right eye  daily. (Patient not taking: Reported on 12/17/2020)    tiZANidine (ZANAFLEX) 2 MG tablet TAKE UP TO 4 PILLS A DAY    Vitamin D, Ergocalciferol, (DRISDOL) 1.25 MG (50000 UNIT) CAPS capsule ONE CAPSULE TWICE WEEKLY ON TUESDAYS/FRIDAYS    vitamin E 1000 UNIT capsule Take 1,000 Units by mouth daily.     vitamin E 1000 UNIT capsule vitamin E 670 mg (1,000 unit) capsule   1 capsule every day by oral route.    zolpidem (AMBIEN) 10 MG tablet Take 10 mg by mouth at bedtime as needed. For sleep    No facility-administered encounter medications on file as of 03/13/2021.    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   Secondary open-angle glaucoma of right eye, 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   Prediabetes 01/01/2020   Vitamin D deficiency 01/01/2020   Vitamin B 12 deficiency 01/01/2020   At risk for diabetes mellitus 01/01/2020   Class 1 obesity with serious comorbidity and body mass index (BMI) of 32.0 to 32.9 in adult 01/01/2020   Anosmia 11/15/2019   Disturbance of smell and taste 11/15/2019   Epiretinal membrane, right 07/03/2019   Depression 03/06/2019   Diverticulosis 06/14/2018   Hepatic steatosis 06/14/2018   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   Decreased pedal pulses 06/30/2016   Pain in both lower extremities 06/30/2016   Facial pain, atypical 06/08/2016   Fibroids 02/12/2016   Chest pain 07/07/2015   Dizziness and giddiness 07/07/2015   Leiomyoma of uterus 07/07/2015   Lower abdominal pain 07/07/2015   Hip pain 03/03/2015   Optic neuritis 06/27/2014   Multiple sclerosis ([place]) 06/26/2014    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/26/2014   Other fatigue 06/26/2014   Dysesthesia 06/26/2014   Depression with anxiety 06/26/2014   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   Insomnia 06/26/2014   Low serum vitamin D 06/26/2014    Conditions to be addressed/monitored:  MS, Recurrent Major Depression, Prediabetes, Vitamin D Deficiency, Class 1 Obesity ;  Food Insecurity and Grief   Care Plan : Social Work [facility] Care Plan  Updates made by Daneen Schick since 03/13/2021 12:00 AM     Problem: Quality of Life (General Plan of Care)      Long-Range Goal: Quality of Life Maintained   Start Date: 10/15/2020  This Visit's Progress: On track  Priority: Medium  Note:   Current Barriers:  Chronic disease management support and education needs related to  MS, Depression, and Class 1 Obesity   Recent loss of spouse  Social Worker Clinical Goal(s):  patient will work with SW to identify and address any acute and/or chronic care coordination needs related to the self health management of  MS, Depression, and Class 1 Obesity   Patient will participate in grief share with local church to assist with building a support network following the loss of her spouse  SW Interventions:  Inter-disciplinary care team collaboration (see longitudinal plan of care) Collaboration with [name], MD regarding development and update of comprehensive plan of care as evidenced by provider attestation and co-signature Successful outbound call placed to the patient to assess for care coordination needs Confirmed patient does have an appointment next week to meet with a psychiatrist to assess for memory loss SDoH screen completed - identified patient is having more difficulty affording food due to rising costs Provided verbal and written education on local food resources close to the patients home such as food pantry's and church's offering community meals which she may use to supplement her food supply Discussed the patient has  completed the Grief Share at a local church as well as group grief counseling offered by a local hospice program Patient reports she plans to attend an event called "Light up a Life" which is offered by [facility] Patient feels she may have some PTSD from losing her spouse and plans to speak with a psychiatrist about this during next weeks appointment Encouraged the patient to speak with her primary care provider as needed as well as to outreach  hospice for more grief support if she feels she needs more support after meeting with the psychiatrist Determined the patient has yet to receive an Epi Pen and reports she does not recall speaking with a member of the pharmacy team to identify affordable options Advised the patient SW would collaborate with [name] PharmD to request patient outreach and assistance with obtaining an Epi Pen Scheduled follow up call on 1.16.23  Patient Goals/Self-Care Activities patient will:   -  Attend Light Up a Life Event -Work with PharmD to obtain an Epi Pen -Review mailed resource information -Contact SW as needed prior to next scheduled call  Follow Up Plan:  SW will follow up with the patient on 1.16.23       Follow Up Plan: SW will follow up with patient by phone over the next 90 days      Daneen Schick, BSW, CDP Social Worker, Certified Dementia Practitioner [place] [facility] [place] [facility] [phone]

## 2021-03-13 NOTE — Patient Instructions (Signed)
Social Worker Visit Information  Goals we discussed today:   Goals Addressed             This Visit's Progress    Quality of Life Maintained   On track    Timeframe:  Long-Range Goal Priority:  Medium Start Date: 6.8.22                                          Next planned outreach: 1.16.23  Patient Goals/Self-Care Activities patient will:   - Attend Light Up a Life Event -Work with PharmD to obtain an Epi Pen -Review mailed resource information -Contact SW as needed prior to next scheduled call        Food Pantry's in [place] 3rd Saturday 9-10am [place] provides food boxes to families in the [place] and [place] areas.  [address] [place], [place] Contact: Coralie Keens [email or web address] [phone]  Senior Luncheon, FREE Monthly Free catered lunch, age 11 and up. 10:00am the 1st Tuesday of the month  [place] in the [facility], [place] 10:00am the 3rd Tuesday of the month [place] Helping seniors maintain their independence! [place] More info: [phone] ext [employer] To receive food, call [address] on Monday mornings starting at 8:30am at, [phone] extension:111 and a member of our food pantry staff will answer and assist you. We feed 30 families per week.   The quantity of food is designed to feed a family for a week. There is no charge to the recipients for the food. Food is distributed each Saturday from 10:30 - 11:30 a.m. in Friday (located behind [employer]).  [place] is proud to have its very own [place], although it is small we do and can serve a little over 200 people each year. Please call ahead and let Building surveyor know the number of people in the family needing food and it will be organized waiting for you once you  arrive. [place] "Our Table" is a free community meal offered every Thursday at [place] may dine in or take out (drive thru) This is a free meal that you may donate to if you wish.    Materials Provided: Yes: food resources provided   Patient verbalizes understanding of instructions provided today and agrees to view in [place].   Follow Up Plan: Appointment scheduled for SW follow up with client by phone on: 1.16.23   03-02-1967, [place], CDP Social Worker, Certified Dementia Practitioner [place] [facility] [place] [facility] [phone]

## 2021-03-15 ENCOUNTER — Other Ambulatory Visit: Payer: Self-pay

## 2021-03-16 MED ORDER — HYDROCODONE-ACETAMINOPHEN 5-325 MG PO TABS: 1.0000 | ORAL_TABLET | Freq: Three times a day (TID) | ORAL | 0 refills | Status: DC | PRN

## 2021-03-16 MED ORDER — METHYLPHENIDATE HCL ER (OSM) 27 MG PO TBCR: 27.0000 mg | EXTENDED_RELEASE_TABLET | ORAL | 0 refills | Status: DC

## 2021-03-22 ENCOUNTER — Other Ambulatory Visit: Payer: Self-pay | Admitting: Internal Medicine

## 2021-04-08 DIAGNOSIS — F418 Other specified anxiety disorders: Secondary | ICD-10-CM

## 2021-04-08 DIAGNOSIS — F3342 Major depressive disorder, recurrent, in full remission: Secondary | ICD-10-CM

## 2021-04-09 ENCOUNTER — Telehealth: Payer: Self-pay

## 2021-04-09 NOTE — Chronic Care Management (AMB) (Signed)
Chronic Care Management Pharmacy Assistant   Name: Grace [name]  MRN: [id number] DOB: [birth year]   Reason for Encounter: Disease State/ General  Recent office visits:  12-18-2020 Daneen Schick (CCM)  01-21-2021 Little, Claudette Stapler, RN (CCM)  03-13-2021 Daneen Schick (CCM)  Recent consult visits:  11-26-2020 Felipa Furnace, DPM (Podiatry). DG foot complete left preformed.  12-08-2020 Rondel Oh, MD (Ophthalmology). Glaucoma evaluation. Stop Timolol x1 OD. Follow up in 3 months.  12-17-2020 Sater, Nanine Means, MD (Neurology). START Concerta 27 mg daily.   03-05-2021 Rondel Oh, MD (Ophthalmology). Glaucoma evaluation. Recommend ATs prn  Letter to Dr. [name]  Reviewed proper drop instillation technique Return in about 4 months   Hospital visits:  None in previous 6 months  Medications: Outpatient Encounter Medications as of 04/09/2021  Medication Sig Note   Ascorbic Acid (VITAMIN C) 1000 MG tablet Take 1,000 mg by mouth daily.    aspirin EC 81 MG tablet Take 81 mg by mouth daily.    baclofen (LIORESAL) 10 MG tablet Take 2 tablets (20 mg total) by mouth 3 (three) times daily.    BLACK ELDERBERRY PO Take 300 mg by mouth in the morning and at bedtime.    Cholecalciferol (VITAMIN D) 50 MCG (2000 UT) CAPS Take 2,000 Units by mouth daily.     CINNAMON PO Take 1,200 mg by mouth daily.     diphenhydrAMINE (BENADRYL) 25 MG tablet Take 25 mg by mouth 3 (three) times daily. Takes with Hydrocodone    doxepin (SINEQUAN) 10 MG capsule TAKE 1 TO 2 CAPSULES AT BEDTIME    EPINEPHrine 0.3 mg/0.3 mL IJ SOAJ injection Use as directed for life-threatening allergic reaction.    etodolac (LODINE) 500 MG tablet Take 500 mg by mouth. 1-2 times a day    famotidine (PEPCID) 20 MG tablet TAKE 1 TABLET BY MOUTH TWICE A DAY    Fexofenadine-Pseudoephedrine (ALLEGRA-D PO) Take by mouth in the morning.    Flaxseed, Linseed, (FLAX SEED OIL) 1000 MG CAPS Take 1,200 mg by  mouth daily.     gabapentin (NEURONTIN) 300 MG capsule Take 1 capsule (300 mg total) by mouth 3 (three) times daily. 11/20/2020: Taking once or twice per day.    HYDROcodone-acetaminophen (NORCO/VICODIN) 5-325 MG tablet Take 1 tablet by mouth 3 (three) times daily as needed for moderate pain.    ipratropium (ATROVENT) 0.06 % nasal spray Use 2 sprays each nostril bid prn drainage    L-Lysine 1000 MG TABS Take by mouth. As needed    Lactobacillus (PROBIOTIC ACIDOPHILUS PO) Take by mouth daily. Digestive Advantage BC   bacillus coagulans    levocetirizine (XYZAL) 5 MG tablet TAKE 1 TABLET BY MOUTH EVERY DAY IN THE EVENING    LORazepam (ATIVAN) 1 MG tablet Take 1 tablet (1 mg total) by mouth at bedtime.    Lutein-Bilberry (BILBERRY PLUS LUTEIN PO) Take by mouth daily.    Melatonin 10 MG TABS Take 1 tablet by mouth at bedtime.     methylphenidate (CONCERTA) 27 MG PO CR tablet Take 1 tablet (27 mg total) by mouth every morning.    Moringa 500 MG CAPS Take 2 capsules by mouth daily.    Multiple Vitamins-Minerals (HAIR/SKIN/NAILS/BIOTIN) TABS Take by mouth daily.     Nutritional Supplements (NUTRITIONAL SUPPLEMENT PO) Take 2.1 g by mouth in the morning and at bedtime. LION'S MANE    nystatin-triamcinolone ointment (MYCOLOG) Apply 1 application topically 2 (two) times daily.    Oxcarbazepine (  TRILEPTAL) 300 MG tablet Take 300 mg by mouth 3 (three) times daily.    rizatriptan (MAXALT-MLT) 10 MG disintegrating tablet Take 1 tablet (10 mg total) by mouth as needed for migraine. May repeat in 2 hours if needed.  Max of 2 tablets in 24 hours, and 10 tablets per month    temazepam (RESTORIL) 15 MG capsule Take 1 capsule (15 mg total) by mouth at bedtime as needed for sleep.    timolol (BETIMOL) 0.5 % ophthalmic solution Place 1 drop into the right eye 2 (two) times daily. (Patient not taking: Reported on 12/17/2020) 11/20/2020: Instill 1 drop into the right eye one time daily.    timolol (TIMOPTIC) 0.5 %  ophthalmic solution Place 1 drop into the right eye daily. (Patient not taking: Reported on 12/17/2020)    tiZANidine (ZANAFLEX) 2 MG tablet TAKE UP TO 4 PILLS A DAY    Vitamin D, Ergocalciferol, (DRISDOL) 1.25 MG (50000 UNIT) CAPS capsule ONE CAPSULE TWICE WEEKLY ON TUESDAYS/FRIDAYS    vitamin E 1000 UNIT capsule Take 1,000 Units by mouth daily.     vitamin E 1000 UNIT capsule vitamin E 670 mg (1,000 unit) capsule   1 capsule every day by oral route.    zolpidem (AMBIEN) 10 MG tablet Take 10 mg by mouth at bedtime as needed. For sleep    No facility-administered encounter medications on file as of 04/09/2021.   04-09-2021: 1st attempt left VM 04-17-2021: 2nd attempt left VM 04-20-2021: 3rd attempt left VM  Care Gaps: Shingrix overdue Covid vaccine overdue Flu vaccine overdue AWV 10-21-2021  Star Rating Drugs: None  Jeannette How [facility] Clinical Pharmacist Assistant [phone]

## 2021-04-16 ENCOUNTER — Ambulatory Visit (INDEPENDENT_AMBULATORY_CARE_PROVIDER_SITE_OTHER): Payer: Medicare Other | Admitting: Family Medicine

## 2021-04-16 ENCOUNTER — Encounter: Payer: Self-pay | Admitting: Family Medicine

## 2021-04-16 VITALS — BP 128/80 | HR 87 | Ht 66.0 in | Wt 220.0 lb

## 2021-04-16 DIAGNOSIS — R5383 Other fatigue: Secondary | ICD-10-CM | POA: Diagnosis not present

## 2021-04-16 DIAGNOSIS — F988 Other specified behavioral and emotional disorders with onset usually occurring in childhood and adolescence: Secondary | ICD-10-CM

## 2021-04-16 DIAGNOSIS — Z79891 Long term (current) use of opiate analgesic: Secondary | ICD-10-CM | POA: Diagnosis not present

## 2021-04-16 DIAGNOSIS — G35 Multiple sclerosis: Secondary | ICD-10-CM | POA: Diagnosis not present

## 2021-04-16 DIAGNOSIS — G4700 Insomnia, unspecified: Secondary | ICD-10-CM

## 2021-04-16 MED ORDER — HYDROCODONE-ACETAMINOPHEN 5-325 MG PO TABS: 1.0000 | ORAL_TABLET | Freq: Three times a day (TID) | ORAL | 0 refills | Status: DC | PRN

## 2021-04-16 MED ORDER — GABAPENTIN 300 MG PO CAPS: 300.0000 mg | ORAL_CAPSULE | Freq: Three times a day (TID) | ORAL | 1 refills | Status: DC

## 2021-04-16 MED ORDER — METHYLPHENIDATE HCL ER (OSM) 27 MG PO TBCR: 27.0000 mg | EXTENDED_RELEASE_TABLET | ORAL | 0 refills | Status: DC

## 2021-04-16 NOTE — Progress Notes (Signed)
PATIENT: Grace [name] DOB: 07-04-1960  REASON FOR VISIT: follow up HISTORY FROM: patient  Chief Complaint  Patient presents with   Follow-up    RM 2, alone. Last seen 12/17/20.  Off MS DMT. Has boot on foot d/t bone spur. Has to wear a month before determining surgery need.      HISTORY OF PRESENT ILLNESS: Today 04/16/21 Grace [name] is a 60 y.o. female here today for follow up for MS. She is not currently on DMT. MRI cervical spine did show 1 new focus and two older lesions in 07/2020. She plans to have braces removed 12/13 and will call to see about getting MRI brain.   She reports MS symptoms are stable. No new or exacerbating symptoms. She has good days and bad days when it comes to generalized numbness and tingling. Gait is stable. She is wearing a boot on left for bone spur. No assistive device.   She continues gabapentin 300mg  daily-TID as needed, Norco 5-325 TID PRN and tizanidine 2mg  QID as needed for pain. Baclofen at night to help with RLS.   She was started on Concerta for fatigue and inattention. She does feel that it helps. No significant change in headaches. She uses rizatriptan for abortive.   She is taking doxepin 10-20mg  QHS. Temazepam She does not sleep well. She sleeps a hour or two at time. Ambien not used often due to concerns of side effects. She feels mood is stable. She lost her husband in April and now seeing hospice grief counselor.    HISTORY: (copied from Dr Garth Bigness previous note)  Grace [name] is a 60 y.o. woman with relapsing remitting multiple sclerosis.      Update 12/17/2020: She is off a  DMT for MS.  She is neurologically stable.      She had a fall recently when she tripped.   She saw orthopedics due to sciatica and hip pain.   She was prescribed a steroid pack and pain improved.      She has tingling in her arms and feet..   She feels her hand grip is reduced.    NCV/EMG had not shown neuropathy.   Legs are doing ok.     She has urinary urgency which is worse.    Her gait is mildly off balance but no falls.  She continues to experience some dysesthesias especially in the hands.     She has pain in the neck, back.  She gets some benefit from gabapentin and hydrocodone.   She continues to have vision problems since cataract surgery and complications with elevated IOP.     She is sleeping poorly despite hydrocodone, baclofen, doxepin, melatonin, benadryl at night.  Temazepma helped her fall asleep but not stay asleep. Ambien had helped her some in the past but she is concerned about side effects (one of her friends had sleep-driving and wrecked).     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 She had optic neuritis in 1999. In 2002, she had right-sided numbness. MRIs of the brain show just a couple of nonspecific foci. A cervical spine MRI showed a focus consistent with demyelination at C5. Additionally, there are 2 smaller foci at T2 and T3-T4.  CSF was also abnormal c/w MS.     She was initially placed on Betaseron  and then on Copaxone. She was unable to tolerate either of these and has not been on any DMT medication since 2003.   She retried Betaseron but stopped after diagnosis of avascular necrosis requiring hip replacement.  She was on Copaxone for a while.   Of note, she had had only one course of IV steroids prior to that and has had some afterwards.   She had not wanted to go on Tysabri or Gilenya and opted to go on no disease modifying therapy since 2012. At her last visit on 01/17/2014, she decided to go on Aubagio but due to elevated liver function test stopped..     Most recent MRIs are from earlier this year and showed no changes.  She has been off of all disease modifying therapy since 2018.   02/05/2018: Brain MRI showed "Multiple T2/FLAIR hyperintense foci in the hemispheres.   This is a nonspecific finding and could be due to chronic microvascular ischemic changes or 2 demyelination.  None of the foci appears to be acute.  When compared to the MRI dated 07/03/2016, there is no interval change.  There is a normal enhancement pattern.  REVIEW OF SYSTEMS: Out of a complete 14 system review of symptoms, the patient complains only of the following symptoms, weakness, numbness, headaches, and all other reviewed systems are negative.  ALLERGIES: Allergies  Allergen Reactions   Diclofenac Anaphylaxis   Lamictal [Lamotrigine] Shortness Of Breath, Itching and Swelling    Tongue swells   Mobic [Meloxicam] Anaphylaxis   Ultram [Tramadol Hcl] Anaphylaxis   Nefazodone Itching and Swelling    Other reaction(s): Arthralgia (Joint Pain); Throat swelling, mouth sores   Betaseron [Interferon Beta-1b] Other (See Comments)    Caused avascular necrosis   Codeine Itching   Copaxone [Glatiramer Acetate] Other (See Comments)    Altered mental status: mental changes, anxiety, motion sickness   Gabapentin Itching    Itching if she takes more than 300mg    Latex     Latex band-aids cause redness and tears your skin   Nitrofurantoin     Causing itching all over, tongue swelling, chest heaviness, swollen lips, cough, rapid HR, swollen eyelids, red eyes/gums, lips red/white per pt.   Oxcarbazepine Itching and Other (See Comments)    Dizziness, throat hurts   Teriflunomide Diarrhea    Hair loss, joint pains, elevated liver enzymes abugio (joint pains and liver issues)   Trazodone And Nefazodone     Mouth sores, tongue swelling   Tape Rash    HOME MEDICATIONS: Outpatient Medications Prior to Visit  Medication Sig Dispense Refill   Ascorbic Acid (VITAMIN C) 1000 MG tablet Take 1,000 mg by mouth daily.     aspirin EC 81 MG tablet Take 81 mg by mouth daily.     baclofen (LIORESAL) 10 MG tablet Take 2 tablets (20 mg total) by mouth 3 (three) times daily. 180 tablet 11   BLACK ELDERBERRY  PO Take 300 mg by mouth in the morning and at bedtime.     Cholecalciferol (VITAMIN D) 50 MCG (2000 UT) CAPS Take 2,000 Units by mouth daily.      CINNAMON PO Take 1,200 mg by mouth daily.      diphenhydrAMINE (BENADRYL) 25 MG tablet Take 25 mg by mouth 3 (three) times daily. Takes with Hydrocodone     doxepin (SINEQUAN) 10 MG capsule TAKE 1 TO 2 CAPSULES AT BEDTIME 180 capsule 2   EPINEPHrine 0.3 mg/0.3 mL IJ SOAJ injection Use as directed for life-threatening  allergic reaction. 1 each 3   famotidine (PEPCID) 20 MG tablet TAKE 1 TABLET BY MOUTH TWICE A DAY 180 tablet 1   Fexofenadine-Pseudoephedrine (ALLEGRA-D PO) Take by mouth in the morning.     Flaxseed, Linseed, (FLAX SEED OIL) 1000 MG CAPS Take 1,200 mg by mouth daily.      ipratropium (ATROVENT) 0.06 % nasal spray Use 2 sprays each nostril bid prn drainage 30 mL 5   L-Lysine 1000 MG TABS Take by mouth. As needed     Lactobacillus (PROBIOTIC ACIDOPHILUS PO) Take by mouth daily. Digestive Advantage BC   bacillus coagulans     levocetirizine (XYZAL) 5 MG tablet TAKE 1 TABLET BY MOUTH EVERY DAY IN THE EVENING 90 tablet 2   LORazepam (ATIVAN) 1 MG tablet Take 1 tablet (1 mg total) by mouth at bedtime. 30 tablet 5   Lutein-Bilberry (BILBERRY PLUS LUTEIN PO) Take by mouth daily.     Melatonin 10 MG TABS Take 1 tablet by mouth at bedtime.      Moringa 500 MG CAPS Take 2 capsules by mouth daily.     Multiple Vitamins-Minerals (HAIR/SKIN/NAILS/BIOTIN) TABS Take by mouth daily.      Nutritional Supplements (NUTRITIONAL SUPPLEMENT PO) Take 2.1 g by mouth in the morning and at bedtime. LION'S MANE     nystatin-triamcinolone ointment (MYCOLOG) Apply 1 application topically 2 (two) times daily. 30 g 2   rizatriptan (MAXALT-MLT) 10 MG disintegrating tablet Take 1 tablet (10 mg total) by mouth as needed for migraine. May repeat in 2 hours if needed.  Max of 2 tablets in 24 hours, and 10 tablets per month 10 tablet 11   tiZANidine (ZANAFLEX) 2 MG tablet  TAKE UP TO 4 PILLS A DAY 360 tablet 1   Vitamin D, Ergocalciferol, (DRISDOL) 1.25 MG (50000 UNIT) CAPS capsule ONE CAPSULE TWICE WEEKLY ON TUESDAYS/FRIDAYS 24 capsule 2   vitamin E 1000 UNIT capsule Take 1,000 Units by mouth daily.      vitamin E 1000 UNIT capsule vitamin E 670 mg (1,000 unit) capsule   1 capsule every day by oral route.     zolpidem (AMBIEN) 10 MG tablet Take 10 mg by mouth at bedtime as needed. For sleep     etodolac (LODINE) 500 MG tablet Take 500 mg by mouth. 1-2 times a day     gabapentin (NEURONTIN) 300 MG capsule Take 1 capsule (300 mg total) by mouth 3 (three) times daily. 90 capsule 11   HYDROcodone-acetaminophen (NORCO/VICODIN) 5-325 MG tablet Take 1 tablet by mouth 3 (three) times daily as needed for moderate pain. 90 tablet 0   methylphenidate (CONCERTA) 27 MG PO CR tablet Take 1 tablet (27 mg total) by mouth every morning. 30 tablet 0   Oxcarbazepine (TRILEPTAL) 300 MG tablet Take 300 mg by mouth 3 (three) times daily.     temazepam (RESTORIL) 15 MG capsule Take 1 capsule (15 mg total) by mouth at bedtime as needed for sleep. 30 capsule 2   timolol (BETIMOL) 0.5 % ophthalmic solution Place 1 drop into the right eye 2 (two) times daily. (Patient not taking: Reported on 12/17/2020)     timolol (TIMOPTIC) 0.5 % ophthalmic solution Place 1 drop into the right eye daily. (Patient not taking: Reported on 12/17/2020)     No facility-administered medications prior to visit.    PAST MEDICAL HISTORY: Past Medical History:  Diagnosis Date   Allergy    Anxiety    Avascular necrosis (HCC)    Back pain  Cancer [facility])    Chest pain    Chewing difficulty    Chronic fatigue syndrome    Colon polyp    pre-cancerous   Complication of anesthesia    patient states she requires more anesthesia   Depression    Disorder of soft tissue    Diverticulosis    Dizziness    Edema, lower extremity    Fatty liver    Fibromyalgia    GERD (gastroesophageal reflux disease)    H/O  breast biopsy    pre- cancerous cells   Hand fracture, left    9/87   Heart palpitations    Heart valve disease    History of Holter monitoring    Hyperlipidemia    Insomnia    Joint pain    Lactose intolerance    Lhermitte's syndrome    Migraines    Multiple food allergies    Multiple sclerosis (HCC)    dx in 11/1996   Multiple sclerosis (HCC)    Neuromuscular disorder (HCC)    Neuropathy    Numbness and tingling in hands    Obesity    Optic neuritis    OSA (obstructive sleep apnea)    no longer have OSA since stopped taking a MS medication   Osteoarthritis    Otitis media    Palpitations    Prediabetes    Raynaud's phenomenon    Shortness of breath    Sleep apnea    Due to medication that she no longer takes.   Swallowing difficulty    Vaginitis and vulvovaginitis    Vitamin B12 deficiency    Vitamin D deficiency     PAST SURGICAL HISTORY: Past Surgical History:  Procedure Laterality Date   ABDOMINAL HYSTERECTOMY Bilateral 02/12/2016   Procedure: HYSTERECTOMY ABDOMINAL WITH BILATERAL SALPINGO OOPHERECTOMY;  Surgeon: Dian Queen, MD;  Location: [facility];  Service: Gynecology;  Laterality: Bilateral;   BREAST EXCISIONAL BIOPSY     core/left breast   CATARACT EXTRACTION, BILATERAL  06/2019   CHOLECYSTECTOMY  1983   COLONOSCOPY     1997/2001   DILATION AND CURETTAGE OF UTERUS     ENDOMETRIAL ABLATION  2012   LAPAROSCOPIC ABDOMINAL EXPLORATION     ovaries and intestines bond together   LEFT HEART CATHETERIZATION WITH CORONARY ANGIOGRAM N/A 06/21/2011   Procedure: LEFT HEART CATHETERIZATION WITH CORONARY ANGIOGRAM;  Surgeon: Leonie Man, MD;  Location: [facility] CATH LAB;  Service: Cardiovascular;  Laterality: N/A;   LUMBAR PUNCTURE     11/01/1996   SALPINGOOPHORECTOMY Bilateral 02/12/2016   Procedure: BILATERAL SALPINGO OOPHORECTOMY;  Surgeon: Dian Queen, MD;  Location: [facility];  Service: Gynecology;  Laterality: Bilateral;   TOTAL HIP ARTHROPLASTY     left  hip  6/02  /right hip12/04    FAMILY HISTORY: Family History  Problem Relation Age of Onset   Pancreatic cancer Mother    Stroke Mother    Hypertension Mother    Hyperlipidemia Mother    Heart disease Mother    Cancer Mother    Depression Mother    Anxiety disorder Mother    Alcoholism Mother    Testicular cancer Brother    Colon cancer Maternal Uncle    Pancreatic cancer Paternal Uncle    Prostate cancer Maternal Grandfather    Colon polyps Father    Diabetes Father    Hypertension Father    Hyperlipidemia Father    Kidney disease Father    Sleep apnea Father    Obesity Father  SOCIAL HISTORY: Social History   Socioeconomic History   Marital status: Widowed    Spouse name: Not on file   Number of children: Not on file   Years of education: Not on file   Highest education level: Not on file  Occupational History   Occupation: disabled  Tobacco Use   Smoking status: Former    Packs/day: 0.25    Years: 8.00    Pack years: 2.00    Types: Cigarettes    Quit date: 07/01/1995    Years since quitting: 25.8   Smokeless tobacco: Never  Vaping Use   Vaping Use: Never used  Substance and Sexual Activity   Alcohol use: No    Alcohol/week: 0.0 standard drinks   Drug use: Yes    Types: Hydrocodone   Sexual activity: Not Currently  Other Topics Concern   Not on file  Social History Narrative   Not on file   Social Determinants of Health   Financial Resource Strain: Low Risk    Difficulty of Paying Living Expenses: Not hard at all  Food Insecurity: Food Insecurity Present   Worried About Charity fundraiser in the Last Year: Sometimes true   Arboriculturist in the Last Year: Never true  Transportation Needs: No Transportation Needs   Lack of Transportation (Medical): No   Lack of Transportation (Non-Medical): No  Physical Activity: Inactive   Days of Exercise per Week: 0 days   Minutes of Exercise per Session: 0 min  Stress: No Stress Concern Present    Feeling of Stress : Not at all  Social Connections: Not on file  Intimate Partner Violence: Not on file      PHYSICAL EXAM  Vitals:   04/16/21 1446  BP: 128/80  Pulse: 87  SpO2: 96%  Weight: 220 lb (99.8 kg)  Height: 5\' 6"  (1.676 m)    Body mass index is 35.51 kg/m.  Generalized: Well developed, in no acute distress  Cardiology: normal rate and rhythm, no murmur noted Neurological examination  Mentation: Alert oriented to time, place, history taking. Follows all commands speech and language fluent Cranial nerve II-XII: Pupils were equal round reactive to light. Extraocular movements were full, visual field were full on confrontational test. Facial sensation and strength were normal. Head turning and shoulder shrug  were normal and symmetric. Motor: The motor testing reveals 5 over 5 strength of all 4 extremities. Good symmetric motor tone is noted throughout.  Sensory: Sensory testing is intact to soft touch on all 4 extremities. No evidence of extinction is noted. Patient reports numbness of medial aspect of bilateral fifth digit from PIP joint distally  Coordination: Cerebellar testing reveals good finger-nose-finger and heel-to-shin not performed due to being in boot.  Gait and station: Gait is normal. Tandem not attempted due to being in boot.    DIAGNOSTIC DATA (LABS, IMAGING, TESTING) - I reviewed patient records, labs, notes, testing and imaging myself where available.  No flowsheet data found.   Lab Results  Component Value Date   WBC 10.3 11/22/2019   HGB 13.7 11/22/2019   HCT 40.3 11/22/2019   MCV 93 11/22/2019   PLT 275 11/22/2019      Component Value Date/Time   NA 137 04/16/2020 1635   K 4.4 04/16/2020 1635   CL 100 04/16/2020 1635   CO2 23 04/16/2020 1635   GLUCOSE 84 04/16/2020 1635   GLUCOSE 120 (H) 02/13/2016 0459   BUN 15 04/16/2020 1635   CREATININE 0.71  04/16/2020 1635   CALCIUM 10.0 04/16/2020 1635   PROT 7.6 04/16/2020 1635   ALBUMIN 4.7  04/16/2020 1635   AST 20 04/16/2020 1635   ALT 24 04/16/2020 1635   ALKPHOS 111 04/16/2020 1635   BILITOT 0.5 04/16/2020 1635   GFRNONAA 96 04/16/2020 1635   GFRAA 110 04/16/2020 1635   Lab Results  Component Value Date   CHOL 205 (H) 11/22/2019   HDL 108 11/22/2019   LDLCALC 71 11/22/2019   TRIG 161 (H) 11/22/2019   CHOLHDL 1.9 11/22/2019   Lab Results  Component Value Date   HGBA1C 5.6 04/16/2020   Lab Results  Component Value Date   VITAMINB12 699 10/16/2020   Lab Results  Component Value Date   TSH 1.500 11/22/2019     ASSESSMENT AND PLAN 60 y.o. year old female  has a past medical history of Allergy, Anxiety, Avascular necrosis ([place]), Back pain, Cancer ([place]), Chest pain, Chewing difficulty, Chronic fatigue syndrome, Colon polyp, Complication of anesthesia, Depression, Disorder of soft tissue, Diverticulosis, Dizziness, Edema, lower extremity, Fatty liver, Fibromyalgia, GERD (gastroesophageal reflux disease), H/O breast biopsy, Hand fracture, left, Heart palpitations, Heart valve disease, History of Holter monitoring, Hyperlipidemia, Insomnia, Joint pain, Lactose intolerance, Lhermitte's syndrome, Migraines, Multiple food allergies, Multiple sclerosis (HCC), Multiple sclerosis (HCC), Neuromuscular disorder (HCC), Neuropathy, Numbness and tingling in hands, Obesity, Optic neuritis, OSA (obstructive sleep apnea), Osteoarthritis, Otitis media, Palpitations, Prediabetes, Raynaud's phenomenon, Shortness of breath, Sleep apnea, Swallowing difficulty, Vaginitis and vulvovaginitis, Vitamin B12 deficiency, and Vitamin D deficiency. here with     ICD-10-CM   1. Multiple sclerosis ([place])  G35     2. Chronic prescription opiate use  Z79.891     3. 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 Other fatigue  R53.83     5. Insomnia, unspecified type  G47.00        Maryanne is fairly stable overall. She will continue medicaitons as prescribed. Hydrocodone,  Concerta and gabapentin refilled. PDMP shows appropriate refills and no other controlled substances being prescribed. We can update MRI brain once braces are removed. She was encouraged to continue regular activity and stay well hydrated. She will follow up in 4 months with Dr Felecia Shelling, sooner if needed.    No orders of the defined types were placed in this encounter.    Meds ordered this encounter  Medications   gabapentin (NEURONTIN) 300 MG capsule    Sig: Take 1 capsule (300 mg total) by mouth 3 (three) times daily.    Dispense:  270 capsule    Refill:  1    Order Specific Question:   Supervising Provider    Answer:   Melvenia Beam [id number]   HYDROcodone-acetaminophen (NORCO/VICODIN) 5-325 MG tablet    Sig: Take 1 tablet by mouth 3 (three) times daily as needed for moderate pain.    Dispense:  90 tablet    Refill:  0    Order Specific Question:   Supervising Provider    Answer:   Melvenia Beam [id number]   methylphenidate (CONCERTA) 27 MG PO CR tablet    Sig: Take 1 tablet (27 mg total) by mouth every morning.    Dispense:  30 tablet    Refill:  0    Order Specific Question:   Supervising Provider    Answer:   Melvenia Beam [[id number]]       EHU DJSHF, FNP-C 04/16/2021, 3:58 PM [facility] [address], [address]  [place], [place] [address] [phone]

## 2021-04-16 NOTE — Patient Instructions (Addendum)
Below is our plan:  We will continue current treatment plan. Continue gabapentin 300mg  up to three times daily as needed, hydrocodone 5-325mg  up to three times daily as needed, tizanidine 2mg  up to four times daily, baclofen at bedtime, doxepin 10-20mg  every night, rizatriptan as needed   Please make sure you are staying well hydrated. I recommend 50-60 ounces daily. Well balanced diet and regular exercise encouraged. Consistent sleep schedule with 6-8 hours recommended.   Please continue follow up with care team as directed.   Follow up with Dr Felecia Shelling in 4 months  You may receive a survey regarding today's visit. I encourage you to leave honest feed back as I do use this information to improve patient care. Thank you for seeing me today!

## 2021-04-20 ENCOUNTER — Ambulatory Visit: Payer: Medicare Other | Admitting: Internal Medicine

## 2021-04-27 ENCOUNTER — Other Ambulatory Visit: Payer: Self-pay | Admitting: *Deleted

## 2021-04-27 ENCOUNTER — Encounter: Payer: Self-pay | Admitting: Neurology

## 2021-04-27 DIAGNOSIS — G35 Multiple sclerosis: Secondary | ICD-10-CM

## 2021-04-28 ENCOUNTER — Other Ambulatory Visit: Payer: Self-pay

## 2021-04-28 ENCOUNTER — Encounter: Payer: Self-pay | Admitting: Internal Medicine

## 2021-04-28 ENCOUNTER — Ambulatory Visit (INDEPENDENT_AMBULATORY_CARE_PROVIDER_SITE_OTHER): Payer: Medicare Other | Admitting: Internal Medicine

## 2021-04-28 VITALS — BP 112/80 | HR 76 | Temp 97.5°F | Ht 66.2 in | Wt 219.6 lb

## 2021-04-28 DIAGNOSIS — Z0000 Encounter for general adult medical examination without abnormal findings: Secondary | ICD-10-CM | POA: Diagnosis not present

## 2021-04-28 DIAGNOSIS — Z79899 Other long term (current) drug therapy: Secondary | ICD-10-CM

## 2021-04-28 DIAGNOSIS — R3915 Urgency of urination: Secondary | ICD-10-CM | POA: Diagnosis not present

## 2021-04-28 DIAGNOSIS — R519 Headache, unspecified: Secondary | ICD-10-CM | POA: Diagnosis not present

## 2021-04-28 DIAGNOSIS — E538 Deficiency of other specified B group vitamins: Secondary | ICD-10-CM | POA: Diagnosis not present

## 2021-04-28 DIAGNOSIS — Z6835 Body mass index (BMI) 35.0-35.9, adult: Secondary | ICD-10-CM

## 2021-04-28 DIAGNOSIS — E559 Vitamin D deficiency, unspecified: Secondary | ICD-10-CM

## 2021-04-28 DIAGNOSIS — G35D Multiple sclerosis, unspecified: Secondary | ICD-10-CM

## 2021-04-28 DIAGNOSIS — G35 Multiple sclerosis: Secondary | ICD-10-CM | POA: Diagnosis not present

## 2021-04-28 DIAGNOSIS — F4321 Adjustment disorder with depressed mood: Secondary | ICD-10-CM

## 2021-04-28 DIAGNOSIS — Z2821 Immunization not carried out because of patient refusal: Secondary | ICD-10-CM

## 2021-04-28 LAB — POCT URINALYSIS DIPSTICK
Blood, UA: NEGATIVE
Glucose, UA: NEGATIVE
Ketones, UA: NEGATIVE
Nitrite, UA: NEGATIVE
Protein, UA: NEGATIVE
Spec Grav, UA: 1.01 (ref 1.010–1.025)
Urobilinogen, UA: 0.2 E.U./dL
pH, UA: 5 (ref 5.0–8.0)

## 2021-04-28 MED ORDER — CYANOCOBALAMIN 1000 MCG/ML IJ SOLN
1000.0000 ug | Freq: Once | INTRAMUSCULAR | Status: AC
  Administered 2021-04-28: 11:00:00 1000 ug via INTRAMUSCULAR

## 2021-04-28 NOTE — Patient Instructions (Signed)

## 2021-04-28 NOTE — Progress Notes (Signed)
[name],acting as a Education administrator for [name], MD.,have documented all relevant documentation on the behalf of [name], MD,as directed by  [name], MD while in the presence of [name], MD.  This visit occurred during the SARS-CoV-2 public health emergency.  Safety protocols were in place, including screening questions prior to the visit, additional usage of staff PPE, and extensive cleaning of exam room while observing appropriate contact time as indicated for disinfecting solutions.  Subjective:     Patient ID: Grace [name] , female    DOB: [birth year] , 60 y.o.   MRN: [id number]   Chief Complaint  Patient presents with   Annual Exam    HPI  She is here today for a full physical exam. She is followed by Dr. Hazle Coca for her GYN care. She reports compliance with meds. Admits she is not getting the exercise she needs.     Past Medical History:  Diagnosis Date   Allergy    Anxiety    Avascular necrosis (HCC)    Back pain    Cancer (HCC)    Chest pain    Chewing difficulty    Chronic fatigue syndrome    Colon polyp    pre-cancerous   Complication of anesthesia    patient states she requires more anesthesia   Depression    Disorder of soft tissue    Diverticulosis    Dizziness    Edema, lower extremity    Fatty liver    Fibromyalgia    GERD (gastroesophageal reflux disease)    H/O breast biopsy    pre- cancerous cells   Hand fracture, left    9/87   Heart palpitations    Heart valve disease    History of Holter monitoring    Hyperlipidemia    Insomnia    Joint pain    Lactose intolerance    Lhermitte's syndrome    Migraines    Multiple food allergies    Multiple sclerosis (HCC)    dx in 11/1996   Multiple sclerosis (HCC)    Neuromuscular disorder (HCC)    Neuropathy    Numbness and tingling in hands    Obesity    Optic neuritis    OSA (obstructive sleep apnea)    no longer have OSA since stopped taking a MS  medication   Osteoarthritis    Otitis media    Palpitations    Prediabetes    Raynaud's phenomenon    Shortness of breath    Sleep apnea    Due to medication that she no longer takes.   Swallowing difficulty    Vaginitis and vulvovaginitis    Vitamin B12 deficiency    Vitamin D deficiency      Family History  Problem Relation Age of Onset   Pancreatic cancer Mother    Stroke Mother    Hypertension Mother    Hyperlipidemia Mother    Heart disease Mother    Cancer Mother    Depression Mother    Anxiety disorder Mother    Alcoholism Mother    Testicular cancer Brother    Colon cancer Maternal Uncle    Pancreatic cancer Paternal Uncle    Prostate cancer Maternal Grandfather    Colon polyps Father    Diabetes Father    Hypertension Father    Hyperlipidemia Father    Kidney disease Father    Sleep apnea Father    Obesity Father  Current Outpatient Medications:    Ascorbic Acid (VITAMIN C) 1000 MG tablet, Take 1,000 mg by mouth daily., Disp: , Rfl:    aspirin EC 81 MG tablet, Take 81 mg by mouth daily., Disp: , Rfl:    baclofen (LIORESAL) 10 MG tablet, Take 2 tablets (20 mg total) by mouth 3 (three) times daily., Disp: 180 tablet, Rfl: 11   BLACK ELDERBERRY PO, Take 300 mg by mouth in the morning and at bedtime., Disp: , Rfl:    Cholecalciferol (VITAMIN D) 50 MCG (2000 UT) CAPS, Take 2,000 Units by mouth daily. , Disp: , Rfl:    CINNAMON PO, Take 1,200 mg by mouth daily. , Disp: , Rfl:    diphenhydrAMINE (BENADRYL) 25 MG tablet, Take 25 mg by mouth 3 (three) times daily. Takes with Hydrocodone, Disp: , Rfl:    doxepin (SINEQUAN) 10 MG capsule, TAKE 1 TO 2 CAPSULES AT BEDTIME, Disp: 180 capsule, Rfl: 2   EPINEPHrine 0.3 mg/0.3 mL IJ SOAJ injection, Use as directed for life-threatening allergic reaction., Disp: 1 each, Rfl: 3   famotidine (PEPCID) 20 MG tablet, TAKE 1 TABLET BY MOUTH TWICE A DAY, Disp: 180 tablet, Rfl: 1   Fexofenadine-Pseudoephedrine (ALLEGRA-D PO),  Take by mouth in the morning., Disp: , Rfl:    Flaxseed, Linseed, (FLAX SEED OIL) 1000 MG CAPS, Take 1,200 mg by mouth daily. , Disp: , Rfl:    gabapentin (NEURONTIN) 300 MG capsule, Take 1 capsule (300 mg total) by mouth 3 (three) times daily., Disp: 270 capsule, Rfl: 1   HYDROcodone-acetaminophen (NORCO/VICODIN) 5-325 MG tablet, Take 1 tablet by mouth 3 (three) times daily as needed for moderate pain., Disp: 90 tablet, Rfl: 0   ipratropium (ATROVENT) 0.06 % nasal spray, Use 2 sprays each nostril bid prn drainage, Disp: 30 mL, Rfl: 5   L-Lysine 1000 MG TABS, Take by mouth. As needed, Disp: , Rfl:    Lactobacillus (PROBIOTIC ACIDOPHILUS PO), Take by mouth daily. Digestive Advantage BC   bacillus coagulans, Disp: , Rfl:    levocetirizine (XYZAL) 5 MG tablet, TAKE 1 TABLET BY MOUTH EVERY DAY IN THE EVENING, Disp: 90 tablet, Rfl: 2   LORazepam (ATIVAN) 1 MG tablet, Take 1 tablet (1 mg total) by mouth at bedtime., Disp: 30 tablet, Rfl: 5   Lutein-Bilberry (BILBERRY PLUS LUTEIN PO), Take by mouth daily., Disp: , Rfl:    Melatonin 10 MG TABS, Take 1 tablet by mouth at bedtime. , Disp: , Rfl:    methylphenidate (CONCERTA) 27 MG PO CR tablet, Take 1 tablet (27 mg total) by mouth every morning., Disp: 30 tablet, Rfl: 0   Moringa 500 MG CAPS, Take 2 capsules by mouth daily., Disp: , Rfl:    Multiple Vitamins-Minerals (HAIR/SKIN/NAILS/BIOTIN) TABS, Take by mouth daily. , Disp: , Rfl:    Nutritional Supplements (NUTRITIONAL SUPPLEMENT PO), Take 2.1 g by mouth in the morning and at bedtime. LION'S MANE, Disp: , Rfl:    nystatin-triamcinolone ointment (MYCOLOG), Apply 1 application topically 2 (two) times daily., Disp: 30 g, Rfl: 2   rizatriptan (MAXALT-MLT) 10 MG disintegrating tablet, Take 1 tablet (10 mg total) by mouth as needed for migraine. May repeat in 2 hours if needed.  Max of 2 tablets in 24 hours, and 10 tablets per month, Disp: 10 tablet, Rfl: 11   tiZANidine (ZANAFLEX) 2 MG tablet, TAKE UP TO 4  PILLS A DAY, Disp: 360 tablet, Rfl: 1   Vitamin D, Ergocalciferol, (DRISDOL) 1.25 MG (50000 UNIT) CAPS capsule, ONE CAPSULE  TWICE WEEKLY ON TUESDAYS/FRIDAYS, Disp: 24 capsule, Rfl: 2   vitamin E 1000 UNIT capsule, vitamin E 670 mg (1,000 unit) capsule   1 capsule every day by oral route., Disp: , Rfl:    zolpidem (AMBIEN) 10 MG tablet, Take 10 mg by mouth at bedtime as needed. For sleep, Disp: , Rfl:    Allergies  Allergen Reactions   Diclofenac Anaphylaxis   Lamictal [Lamotrigine] Shortness Of Breath, Itching and Swelling    Tongue swells   Mobic [Meloxicam] Anaphylaxis   Ultram [Tramadol Hcl] Anaphylaxis   Nefazodone Itching and Swelling    Other reaction(s): Arthralgia (Joint Pain); Throat swelling, mouth sores   Betaseron [Interferon Beta-1b] Other (See Comments)    Caused avascular necrosis   Codeine Itching   Copaxone [Glatiramer Acetate] Other (See Comments)    Altered mental status: mental changes, anxiety, motion sickness   Gabapentin Itching    Itching if she takes more than 378m   Latex     Latex band-aids cause redness and tears your skin   Nitrofurantoin     Causing itching all over, tongue swelling, chest heaviness, swollen lips, cough, rapid HR, swollen eyelids, red eyes/gums, lips red/white per pt.   Oxcarbazepine Itching and Other (See Comments)    Dizziness, throat hurts   Teriflunomide Diarrhea    Hair loss, joint pains, elevated liver enzymes abugio (joint pains and liver issues)   Trazodone And Nefazodone     Mouth sores, tongue swelling   Tape Rash    The patient states she uses post menopausal status for birth control. Last LMP was Patient's last menstrual period was 04/09/2013.. Negative for Dysmenorrhea. Negative for: breast discharge, breast lump(s), breast pain and breast self exam. Associated symptoms include abnormal vaginal bleeding. Pertinent negatives include abnormal bleeding (hematology), anxiety, decreased libido, depression, difficulty falling  sleep, dyspareunia, history of infertility, nocturia, sexual dysfunction, sleep disturbances, urinary incontinence, urinary urgency, vaginal discharge and vaginal itching. Diet regular.The patient states her exercise level is  minimal.   . The patient's tobacco use is:  Social History   Tobacco Use  Smoking Status Former   Packs/day: 0.25   Years: 8.00   Pack years: 2.00   Types: Cigarettes   Quit date: 07/01/1995   Years since quitting: 25.8  Smokeless Tobacco Never  . She has been exposed to passive smoke. The patient's alcohol use is:  Social History   Substance and Sexual Activity  Alcohol Use No   Alcohol/week: 0.0 standard drinks   Review of Systems  Constitutional: Negative.   HENT: Negative.    Eyes: Negative.   Respiratory: Negative.    Cardiovascular: Negative.   Gastrointestinal: Negative.   Endocrine: Negative.   Genitourinary: Negative.   Musculoskeletal: Negative.   Skin: Negative.   Allergic/Immunologic: Negative.   Neurological:  Positive for headaches.       She c/o recent onset of migraine headaches. She has not discussed with her neurologist. Not sure what is triggering her sx. C/o unilateral throbbing headache. No associated nausea.   Hematological: Negative.   Psychiatric/Behavioral: Negative.      Today's Vitals   04/28/21 0918  BP: 112/80  Pulse: 76  Temp: (!) 97.5 F (36.4 C)  Weight: 219 lb 9.6 oz (99.6 kg)  Height: 5' 6.2" (1.681 m)  PainSc: 6   PainLoc: Back   Body mass index is 35.23 kg/m.  Wt Readings from Last 3 Encounters:  04/28/21 219 lb 9.6 oz (99.6 kg)  04/16/21 220 lb (99.8 kg)  12/17/20 213 lb 5 oz (96.8 kg)     Objective:  Physical Exam Vitals and nursing note reviewed.  Constitutional:      Appearance: Normal appearance.  HENT:     Head: Normocephalic and atraumatic.     Right Ear: Tympanic membrane, ear canal and external ear normal.     Left Ear: Tympanic membrane, ear canal and external ear normal.     Nose:      Comments: Masked     Mouth/Throat:     Comments: Masked  Eyes:     Extraocular Movements: Extraocular movements intact.     Conjunctiva/sclera: Conjunctivae normal.     Pupils: Pupils are equal, round, and reactive to light.  Cardiovascular:     Rate and Rhythm: Normal rate and regular rhythm.     Pulses: Normal pulses.     Heart sounds: Normal heart sounds.  Pulmonary:     Effort: Pulmonary effort is normal.     Breath sounds: Normal breath sounds.  Chest:  Breasts:    Tanner Score is 5.     Right: Normal.     Left: Normal.  Abdominal:     General: Bowel sounds are normal.     Palpations: Abdomen is soft.     Comments: Difficult to assess organomegaly due to body habitus  Genitourinary:    Comments: deferred Musculoskeletal:        General: Normal range of motion.     Cervical back: Normal range of motion and neck supple.  Skin:    General: Skin is warm and dry.  Neurological:     General: No focal deficit present.     Mental Status: She is alert and oriented to person, place, and time.  Psychiatric:        Mood and Affect: Mood normal.        Behavior: Behavior normal.        Assessment And Plan:     1. Encounter for general adult medical examination w/o abnormal findings Comments: A full exam was performed. Importance of monthly self breast exams was discussed with the patient.  PATIENT IS ADVISED TO GET 30-45 MINUTES REGULAR EXERCISE NO LESS THAN FOUR TO FIVE DAYS PER WEEK - BOTH WEIGHTBEARING EXERCISES AND AEROBIC ARE RECOMMENDED.  PATIENT IS ADVISED TO FOLLOW A HEALTHY DIET WITH AT LEAST SIX FRUITS/VEGGIES PER DAY, DECREASE INTAKE OF RED MEAT, AND TO INCREASE FISH INTAKE TO TWO DAYS PER WEEK.  MEATS/FISH SHOULD NOT BE FRIED, BAKED OR BROILED IS PREFERABLE.  IT IS ALSO IMPORTANT TO CUT BACK ON YOUR SUGAR INTAKE. PLEASE AVOID ANYTHING WITH ADDED SUGAR, CORN SYRUP OR OTHER SWEETENERS. IF YOU MUST USE A SWEETENER, YOU CAN TRY STEVIA. IT IS ALSO IMPORTANT TO AVOID  ARTIFICIALLY SWEETENERS AND DIET BEVERAGES. LASTLY, I SUGGEST WEARING SPF 50 SUNSCREEN ON EXPOSED PARTS AND ESPECIALLY WHEN IN THE DIRECT SUNLIGHT FOR AN EXTENDED PERIOD OF TIME.  PLEASE AVOID FAST FOOD RESTAURANTS AND INCREASE YOUR WATER INTAKE.  2. Multiple sclerosis ([place]) Comments: Chronic, also followed by Neuro. Recent Neuro notes reviewed in detail. Recent MRI results reviewed in full detail.   3. New onset headache Comments: Possibly due to migraines. She is encouraged to stay well hydrated. She may benefit from Mg supplementation. She is encouraged to let me know if her sx persist.   4. Urinary urgency Comments: I will check urinalysis and treat as indicated.  - POCT Urinalysis Dipstick (81002)  5. Vitamin D deficiency disease Comments: I will check  vitamin D level and supplement as needed.  - Vitamin D (25 hydroxy)  6. Grief Comments: She is still grieving the loss of her husband. She is encouraged to consider continued grief counseling.   7. Vitamin B 12 deficiency Comments: I will check vitamin B12 level today.  She was also given vitamin B12 IM x 1.  - cyanocobalamin ((VITAMIN B-12)) injection 1,000 mcg  8. Class 2 severe obesity due to excess calories with serious comorbidity and body mass index (BMI) of 35.0 to 35.9 in adult [facility]) Comments: She is encouraged to strive for BMI less than 30 to decrease cardiac risk. Advised to aim for at least 150 minutes of exercise per week.   9. Drug therapy - CBC - CMP14+EGFR - Lipid panel - Vitamin B12  10. Influenza vaccination declined  Patient was given opportunity to ask questions. Patient verbalized understanding of the plan and was able to repeat key elements of the plan. All questions were answered to their satisfaction.   I, [name], MD, have reviewed all documentation for this visit. The documentation on 05/16/21 for the exam, diagnosis, procedures, and orders are all accurate and complete.   THE PATIENT IS  ENCOURAGED TO PRACTICE SOCIAL DISTANCING DUE TO THE COVID-19 PANDEMIC.

## 2021-04-29 ENCOUNTER — Encounter: Payer: Self-pay | Admitting: Internal Medicine

## 2021-04-29 LAB — CMP14+EGFR
ALT: 30 IU/L (ref 0–32)
AST: 25 IU/L (ref 0–40)
Albumin/Globulin Ratio: 1.9 (ref 1.2–2.2)
Albumin: 4.4 g/dL (ref 3.8–4.9)
Alkaline Phosphatase: 108 IU/L (ref 44–121)
BUN/Creatinine Ratio: 16 (ref 9–23)
BUN: 12 mg/dL (ref 6–24)
Bilirubin Total: 0.5 mg/dL (ref 0.0–1.2)
CO2: 23 mmol/L (ref 20–29)
Calcium: 9.5 mg/dL (ref 8.7–10.2)
Chloride: 103 mmol/L (ref 96–106)
Creatinine, Ser: 0.73 mg/dL (ref 0.57–1.00)
Globulin, Total: 2.3 g/dL (ref 1.5–4.5)
Glucose: 87 mg/dL (ref 70–99)
Potassium: 4.3 mmol/L (ref 3.5–5.2)
Sodium: 140 mmol/L (ref 134–144)
Total Protein: 6.7 g/dL (ref 6.0–8.5)
eGFR: 95 mL/min/{1.73_m2} (ref >59)

## 2021-04-29 LAB — CBC
Hematocrit: 40 % (ref 34.0–46.6)
Hemoglobin: 13.7 g/dL (ref 11.1–15.9)
MCH: 31.3 pg (ref 26.6–33.0)
MCHC: 34.3 g/dL (ref 31.5–35.7)
MCV: 91 fL (ref 79–97)
Platelets: 317 10*3/uL (ref 150–450)
RBC: 4.38 x10E6/uL (ref 3.77–5.28)
RDW: 11.7 % (ref 11.7–15.4)
WBC: 5 10*3/uL (ref 3.4–10.8)

## 2021-04-29 LAB — LIPID PANEL
Chol/HDL Ratio: 3.3 ratio (ref 0.0–4.4)
Cholesterol, Total: 197 mg/dL (ref 100–199)
HDL: 59 mg/dL (ref >39)
LDL Chol Calc (NIH): 99 mg/dL (ref 0–99)
Triglycerides: 229 mg/dL — ABNORMAL HIGH (ref 0–149)
VLDL Cholesterol Cal: 39 mg/dL (ref 5–40)

## 2021-04-29 LAB — VITAMIN B12: Vitamin B-12: 498 pg/mL (ref 232–1245)

## 2021-04-29 LAB — VITAMIN D 25 HYDROXY (VIT D DEFICIENCY, FRACTURES): Vit D, 25-Hydroxy: 34.8 ng/mL (ref 30.0–100.0)

## 2021-04-30 ENCOUNTER — Ambulatory Visit (INDEPENDENT_AMBULATORY_CARE_PROVIDER_SITE_OTHER): Payer: Medicare Other

## 2021-04-30 ENCOUNTER — Other Ambulatory Visit: Payer: Self-pay

## 2021-04-30 ENCOUNTER — Telehealth: Payer: Medicare Other

## 2021-04-30 DIAGNOSIS — E559 Vitamin D deficiency, unspecified: Secondary | ICD-10-CM

## 2021-04-30 DIAGNOSIS — Z6834 Body mass index (BMI) 34.0-34.9, adult: Secondary | ICD-10-CM

## 2021-04-30 DIAGNOSIS — G35 Multiple sclerosis: Secondary | ICD-10-CM

## 2021-04-30 DIAGNOSIS — F3342 Major depressive disorder, recurrent, in full remission: Secondary | ICD-10-CM

## 2021-04-30 DIAGNOSIS — R7303 Prediabetes: Secondary | ICD-10-CM

## 2021-04-30 MED ORDER — CEPHALEXIN 500 MG PO CAPS: 500.0000 mg | ORAL_CAPSULE | Freq: Three times a day (TID) | ORAL | 0 refills | Status: AC

## 2021-04-30 NOTE — Patient Instructions (Signed)
Visit Information  Thank you for taking time to visit with me today. Please don't hesitate to contact me if I can be of assistance to you before our next scheduled telephone appointment.  Following are the goals we discussed today:  (Copy and paste patient goals from clinical care plan here)  Our next appointment is by telephone on 07/01/21 at 11:05 AM  Please call the care guide team at [phone] if you need to cancel or reschedule your appointment.   If you are experiencing a Mental Health or [place] or need someone to talk to, please call [phone] (toll free, 24 hour hotline)   Patient verbalizes understanding of instructions provided today and agrees to view in [place].   Barb Merino, RN, BSN, CCM Care Management Coordinator [facility]  Direct Phone: [phone]

## 2021-04-30 NOTE — Chronic Care Management (AMB) (Signed)
Chronic Care Management   CCM RN Visit Note  04/30/2021 Name: Grace [name] MRN: [id number] DOB: 1960/09/21  Subjective: Grace [name] is a 60 y.o. year old female who is a primary care patient of [name], MD. The care management team was consulted for assistance with disease management and care coordination needs.    Engaged with patient by telephone for follow up visit in response to provider referral for case management and/or care coordination services.   Consent to Services:  The patient was given information about Chronic Care Management services, agreed to services, and gave verbal consent prior to initiation of services.  Please see initial visit note for detailed documentation.   Patient agreed to services and verbal consent obtained.   Assessment: Review of patient past medical history, allergies, medications, health status, including review of consultants reports, laboratory and other test data, was performed as part of comprehensive evaluation and provision of chronic care management services.   SDOH (Social Determinants of Health) assessments and interventions performed:  Yes, no acute needs  CCM Care Plan  Allergies  Allergen Reactions   Diclofenac Anaphylaxis   Lamictal [Lamotrigine] Shortness Of Breath, Itching and Swelling    Tongue swells   Mobic [Meloxicam] Anaphylaxis   Ultram [Tramadol Hcl] Anaphylaxis   Nefazodone Itching and Swelling    Other reaction(s): Arthralgia (Joint Pain); Throat swelling, mouth sores   Betaseron [Interferon Beta-1b] Other (See Comments)    Caused avascular necrosis   Codeine Itching   Copaxone [Glatiramer Acetate] Other (See Comments)    Altered mental status: mental changes, anxiety, motion sickness   Gabapentin Itching    Itching if she takes more than 300mg    Latex     Latex band-aids cause redness and tears your skin   Nitrofurantoin     Causing itching all over, tongue swelling, chest heaviness,  swollen lips, cough, rapid HR, swollen eyelids, red eyes/gums, lips red/white per pt.   Oxcarbazepine Itching and Other (See Comments)    Dizziness, throat hurts   Teriflunomide Diarrhea    Hair loss, joint pains, elevated liver enzymes abugio (joint pains and liver issues)   Trazodone And Nefazodone     Mouth sores, tongue swelling   Tape Rash    Outpatient Encounter Medications as of 04/30/2021  Medication Sig   Ascorbic Acid (VITAMIN C) 1000 MG tablet Take 1,000 mg by mouth daily.   aspirin EC 81 MG tablet Take 81 mg by mouth daily.   baclofen (LIORESAL) 10 MG tablet Take 2 tablets (20 mg total) by mouth 3 (three) times daily.   BLACK ELDERBERRY PO Take 300 mg by mouth in the morning and at bedtime.   cephALEXin (KEFLEX) 500 MG capsule Take 1 capsule (500 mg total) by mouth 3 (three) times daily for 10 days.   Cholecalciferol (VITAMIN D) 50 MCG (2000 UT) CAPS Take 2,000 Units by mouth daily.    CINNAMON PO Take 1,200 mg by mouth daily.    diphenhydrAMINE (BENADRYL) 25 MG tablet Take 25 mg by mouth 3 (three) times daily. Takes with Hydrocodone   doxepin (SINEQUAN) 10 MG capsule TAKE 1 TO 2 CAPSULES AT BEDTIME   EPINEPHrine 0.3 mg/0.3 mL IJ SOAJ injection Use as directed for life-threatening allergic reaction.   famotidine (PEPCID) 20 MG tablet TAKE 1 TABLET BY MOUTH TWICE A DAY   Fexofenadine-Pseudoephedrine (ALLEGRA-D PO) Take by mouth in the morning.   Flaxseed, Linseed, (FLAX SEED OIL) 1000 MG CAPS Take 1,200 mg by mouth daily.  gabapentin (NEURONTIN) 300 MG capsule Take 1 capsule (300 mg total) by mouth 3 (three) times daily.   HYDROcodone-acetaminophen (NORCO/VICODIN) 5-325 MG tablet Take 1 tablet by mouth 3 (three) times daily as needed for moderate pain.   ipratropium (ATROVENT) 0.06 % nasal spray Use 2 sprays each nostril bid prn drainage   L-Lysine 1000 MG TABS Take by mouth. As needed   Lactobacillus (PROBIOTIC ACIDOPHILUS PO) Take by mouth daily. Digestive Advantage BC    bacillus coagulans   levocetirizine (XYZAL) 5 MG tablet TAKE 1 TABLET BY MOUTH EVERY DAY IN THE EVENING   LORazepam (ATIVAN) 1 MG tablet Take 1 tablet (1 mg total) by mouth at bedtime.   Lutein-Bilberry (BILBERRY PLUS LUTEIN PO) Take by mouth daily.   Melatonin 10 MG TABS Take 1 tablet by mouth at bedtime.    methylphenidate (CONCERTA) 27 MG PO CR tablet Take 1 tablet (27 mg total) by mouth every morning.   Moringa 500 MG CAPS Take 2 capsules by mouth daily.   Multiple Vitamins-Minerals (HAIR/SKIN/NAILS/BIOTIN) TABS Take by mouth daily.    Nutritional Supplements (NUTRITIONAL SUPPLEMENT PO) Take 2.1 g by mouth in the morning and at bedtime. LION'S MANE   nystatin-triamcinolone ointment (MYCOLOG) Apply 1 application topically 2 (two) times daily.   rizatriptan (MAXALT-MLT) 10 MG disintegrating tablet Take 1 tablet (10 mg total) by mouth as needed for migraine. May repeat in 2 hours if needed.  Max of 2 tablets in 24 hours, and 10 tablets per month   tiZANidine (ZANAFLEX) 2 MG tablet TAKE UP TO 4 PILLS A DAY   Vitamin D, Ergocalciferol, (DRISDOL) 1.25 MG (50000 UNIT) CAPS capsule ONE CAPSULE TWICE WEEKLY ON TUESDAYS/FRIDAYS   vitamin E 1000 UNIT capsule vitamin E 670 mg (1,000 unit) capsule   1 capsule every day by oral route.   zolpidem (AMBIEN) 10 MG tablet Take 10 mg by mouth at bedtime as needed. For sleep   No facility-administered encounter medications on file as of 04/30/2021.    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   Secondary open-angle glaucoma of right eye, indeterminate stage  02/03/2020   Prediabetes 01/01/2020   Vitamin D deficiency 01/01/2020   Vitamin B 12 deficiency 01/01/2020   At risk for diabetes mellitus 01/01/2020   Class 1 obesity with serious comorbidity and body mass index (BMI) of 32.0 to 32.9 in adult 01/01/2020   Anosmia 11/15/2019   Disturbance of smell and taste 11/15/2019   Epiretinal membrane, right 07/03/2019   Depression 03/06/2019   Diverticulosis 06/14/2018   Hepatic steatosis 06/14/2018   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   Decreased pedal pulses 06/30/2016   Pain in both lower extremities 06/30/2016   Facial pain, atypical 06/08/2016   Fibroids 02/12/2016   Chest pain 07/07/2015   Dizziness and giddiness 07/07/2015   Leiomyoma of uterus 07/07/2015   Lower abdominal pain 07/07/2015   Hip pain 03/03/2015   Optic neuritis 06/27/2014   Multiple sclerosis ([place]) 06/26/2014   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/26/2014   Other fatigue 06/26/2014   Dysesthesia 06/26/2014   Depression with anxiety 06/26/2014   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   Insomnia 06/26/2014  Low serum vitamin D 06/26/2014    Conditions to be addressed/monitored: MS, Vitamin D Deficiency, Recurrent major depression, Class 1 Obesity due to excess calories, Prediabetes Care Plan : RN Care Manager Plan of Care  Updates made by Lynne Logan, RN since 04/30/2021 12:00 AM     Problem: No plan of Care established for management of chronic disease states (MS, Vitamin D Deficiency, Recurrent major depression, Class 1 Obesity due to excess calories, Prediabetes)   Priority: High     Long-Range Goal: Establishment of plan of care for management of chronic disease states (MS, Vitamin D Deficiency, Recurrent major depression, Class 1 Obesity due to excess calories, Prediabetes)   Start Date: 04/30/2021  Expected End Date: 04/30/2022  This Visit's Progress: On track  Priority: High  Note:   Current Barriers:  Knowledge Deficits related to plan of care for management of MS,  Vitamin D Deficiency, Recurrent major depression, Class 1 Obesity due to excess calories, Prediabetes  Chronic Disease Management support and education needs related to MS, Vitamin D Deficiency, Recurrent major depression, Class 1 Obesity due to excess calories, Prediabetes   RNCM Clinical Goal(s):  Patient will verbalize basic understanding of  MS, Vitamin D Deficiency, Recurrent major depression, Class 1 Obesity due to excess calories, Prediabetes disease process and self health management plan as evidenced by patient will report no disease exacerbations related to her chronic disease states as listed above  take all medications exactly as prescribed and will call provider for medication related questions as evidenced by patient will report having no missed doses of her prescribed medications demonstrate Ongoing health management independence as evidenced by patient will report 100% adherence to her prescribed treatment plan  continue to work with RN Care Manager to address care management and care coordination needs related to  MS, Vitamin D Deficiency, Recurrent major depression, Class 1 Obesity due to excess calories, Prediabetes as evidenced by adherence to CM Team Scheduled appointments demonstrate ongoing self health care management ability   as evidenced by    through collaboration with RN Care manager, provider, and care team.   Interventions: 1:1 collaboration with primary care provider regarding development and update of comprehensive plan of care as evidenced by provider attestation and co-signature Inter-disciplinary care team collaboration (see longitudinal plan of care) Evaluation of current treatment plan related to  self management and patient's adherence to plan as established by provider   Class 1 Obesity:  (Status:  Goal on track:  Yes.)  Long Term Goal Evaluation of current treatment plan related to  Class 1 Obesity ,  self-management and patient's adherence to plan as  established by provider Review of patient status, including review of consultant's reports, relevant laboratory and other test results, and medications completed Reviewed medications with patient and discussed importance of medication adherence Reinforced PCP recommendations for implementing consistent daily exercise regimen, starting off with 10 minutes daily and increasing to 30 minutes daily x 5 days week as tolerated  Mailed printed educational materials related to [place]  Discussed plans with patient for ongoing care management follow up and provided patient with direct contact information for care management team Body mass index is 35.23 kg/m.     Wt Readings from Last 3 Encounters:  04/28/21 219 lb 9.6 oz (99.6 kg)  04/16/21 220 lb (99.8 kg)  12/17/20 213 lb 5 oz (96.8 kg)   Glaucoma:  (Status:  Condition stable.  Not addressed this visit.)  Long Term Goal Evaluation of current treatment plan  related to  Glaucoma , self-management and patient's adherence to plan as established by provider Reviewed visit note from 12/08/20 per Dr. Edilia Bo Ophthalmology;  ASSESSMENT 1. Secondary open-angle glaucoma of right eye, indeterminate stage Patient is taking and tolerating medications as prescribed. IOP good OU. Exam stable.  Has not been taking tim since last visit here. IOP stable - will stop it.  Recheck in 3 months - get VF and OCT RNFL PLAN:  Stop Timolol x1 OD Atropine x1 OD prn  Recommend ATs prn  Reviewed proper drop instillation technique Return in about 3 months (around 03/10/2021) Ophthalmic Meds Ordered this visit:  There were no meds ordered this visit.  Discussed plans with patient for ongoing care management follow up and provided patient with direct contact information for care management team  Multiple Sclerosis:  (Status:  Goal on track:  Yes.)  Long Term Goal Evaluation of current treatment plan related to  Multiple Sclerosis ,  self-management and patient's  adherence to plan as established by provider. Determined patient continues to follow Dr. Felecia Shelling for management of MS, reviewed and discussed upcoming brain MRI per Dr. Felecia Shelling Instructed patient to discuss her concerns regarding clotting factors with Dr. Felecia Shelling at next visit due to patient has noticed she does not have the usual bleeding from small trama or cuts as expected despite being on low dose ASA Educated patient on s/s of UTI and when to seek medical attention if symptoms occur due to patient reporting urinary urgency, she denies other symptoms Determined Dr. Baird Cancer will not send urine culture at this time due to UA is negative  Discussed plans with patient for ongoing care management follow up and provided patient with direct contact information for care management team     Vitamin D deficiency:  (Status:  Goal on track:  Yes.)  Long Term Goal Evaluation of current treatment plan related to  Vitamin D deficiency , self-management and patient's adherence to plan as established by provider. Review of patient status, including review of consultant's reports, relevant laboratory and other test results, and medications completed Collaborated with Dr. Baird Cancer regarding patient's current dosing of Vitamin D and determined patient is taking Vitamin D 3, 50,000 units twice weekly and 2000 units OTC daily Patient encouraged to follow this regimen unless otherwise directed by Dr. Baird Cancer or other health care provider  Discussed plans with patient for ongoing care management follow up and provided patient with direct contact information for care management team Component Ref Range & Units 2 d ago  (04/28/21) 1 yr ago  (04/16/20) 1 yr ago  (10/10/19) 2 yr ago  (01/03/19) 2 yr ago  (06/01/18)  Vit D, 25-Hydroxy 30.0 - 100.0 ng/mL 34.8  42.5 CM  35.5 CM  32.1 CM  38.4 CM    Depression/Grief:  (Status:  Goal on track:  Yes.)  Long Term Goal Evaluation of current treatment plan related to Depression,  self-management and patient's adherence to plan as established by provider Determined patient feels her depression is under good control at this time although she is having experiencing more sadness as the Christmas holidays approach and this will be her first Christmas without her spouse Discussed patient continues telephonic counseling with the [facility] grief counselor monthly and is able to contact this provider if needed prior to her next session Provided active listening to patient and validated her feelings of despair related to not having her spouse here during Christmas  Encouraged patient to keep PCP well informed of new or  worsening symptoms of depression  Discussed plans with patient for ongoing care management follow up and provided patient with direct contact information for care management team  Patient Goals/Self-Care Activities: Take all medications as prescribed Attend all scheduled provider appointments Call pharmacy for medication refills 3-7 days in advance of running out of medications Perform all self care activities independently  Perform IADL's (shopping, preparing meals, housekeeping, managing finances) independently Call provider office for new concerns or questions   Follow Up Plan:  Telephone follow up appointment with care management team member scheduled for:  07/01/21      Plan:Telephone follow up appointment with care management team member scheduled for:  07/01/21  Barb Merino, RN, BSN, CCM Care Management Coordinator [facility]  Direct Phone: [phone]

## 2021-05-09 DIAGNOSIS — F3342 Major depressive disorder, recurrent, in full remission: Secondary | ICD-10-CM

## 2021-05-09 DIAGNOSIS — H409 Unspecified glaucoma: Secondary | ICD-10-CM

## 2021-05-09 DIAGNOSIS — G35 Multiple sclerosis: Secondary | ICD-10-CM | POA: Diagnosis not present

## 2021-05-14 ENCOUNTER — Encounter: Payer: Self-pay | Admitting: Internal Medicine

## 2021-05-16 DIAGNOSIS — Z2821 Immunization not carried out because of patient refusal: Secondary | ICD-10-CM | POA: Insufficient documentation

## 2021-05-16 DIAGNOSIS — R519 Headache, unspecified: Secondary | ICD-10-CM | POA: Insufficient documentation

## 2021-05-16 DIAGNOSIS — R3915 Urgency of urination: Secondary | ICD-10-CM | POA: Insufficient documentation

## 2021-05-16 DIAGNOSIS — F4321 Adjustment disorder with depressed mood: Secondary | ICD-10-CM | POA: Insufficient documentation

## 2021-05-17 ENCOUNTER — Other Ambulatory Visit: Payer: Self-pay

## 2021-05-17 ENCOUNTER — Ambulatory Visit
Admission: RE | Admit: 2021-05-17 | Discharge: 2021-05-17 | Disposition: A | Discharge status: Home / Self Care | Payer: Medicare Other | Source: Ambulatory Visit | Attending: Family Medicine | Admitting: Family Medicine

## 2021-05-17 DIAGNOSIS — G35 Multiple sclerosis: Secondary | ICD-10-CM | POA: Diagnosis not present

## 2021-05-17 MED ORDER — GADOBENATE DIMEGLUMINE 529 MG/ML IV SOLN
20.0000 mL | Freq: Once | INTRAVENOUS | Status: AC | PRN
  Administered 2021-05-17: 20 mL via INTRAVENOUS

## 2021-05-20 ENCOUNTER — Ambulatory Visit (INDEPENDENT_AMBULATORY_CARE_PROVIDER_SITE_OTHER): Payer: Medicare Other

## 2021-05-20 ENCOUNTER — Other Ambulatory Visit: Payer: Self-pay

## 2021-05-20 ENCOUNTER — Ambulatory Visit (INDEPENDENT_AMBULATORY_CARE_PROVIDER_SITE_OTHER): Payer: Medicare Other | Admitting: Internal Medicine

## 2021-05-20 ENCOUNTER — Encounter: Payer: Self-pay | Admitting: Internal Medicine

## 2021-05-20 VITALS — BP 118/86 | HR 95 | Temp 98.2°F | Ht 66.2 in | Wt 223.0 lb

## 2021-05-20 DIAGNOSIS — R002 Palpitations: Secondary | ICD-10-CM | POA: Diagnosis not present

## 2021-05-20 DIAGNOSIS — R0602 Shortness of breath: Secondary | ICD-10-CM | POA: Diagnosis not present

## 2021-05-20 DIAGNOSIS — Z6835 Body mass index (BMI) 35.0-35.9, adult: Secondary | ICD-10-CM

## 2021-05-20 DIAGNOSIS — Z2821 Immunization not carried out because of patient refusal: Secondary | ICD-10-CM

## 2021-05-20 DIAGNOSIS — J341 Cyst and mucocele of nose and nasal sinus: Secondary | ICD-10-CM

## 2021-05-20 DIAGNOSIS — F3342 Major depressive disorder, recurrent, in full remission: Secondary | ICD-10-CM

## 2021-05-20 DIAGNOSIS — E559 Vitamin D deficiency, unspecified: Secondary | ICD-10-CM

## 2021-05-20 MED ORDER — MAGNESIUM GLYCINATE 100 MG PO CAPS: 100.0000 mg | ORAL_CAPSULE | Freq: Every evening | ORAL | 1 refills | Status: DC

## 2021-05-20 NOTE — Patient Instructions (Addendum)
Magnesium glycinate, use as directed  Palpitations Palpitations are feelings that your heartbeat is irregular or is faster than normal. It may feel like your heart is fluttering or skipping a beat. Palpitations may be caused by many things, including smoking, caffeine, alcohol, stress, and certain medicines or drugs. Most causes of palpitations are not serious.  However, some palpitations can be a sign of a serious problem. Further tests and a thorough medical history will be done to find the cause of your palpitations. Your provider may order tests such as an ECG, labs, an echocardiogram, or an ambulatory continuous ECG monitor. Follow these instructions at home: Pay attention to any changes in your symptoms. Let your health care provider know about them. Take these actions to help manage your symptoms: Eating and drinking Follow instructions from your health care provider about eating or drinking restrictions. You may need to avoid foods and drinks that may cause palpitations. These may include: Caffeinated coffee, tea, soft drinks, and energy drinks. Chocolate. Alcohol. Diet pills. Lifestyle   Take steps to reduce your stress and anxiety. Things that can help you relax include: Yoga. Mind-body activities, such as deep breathing, meditation, or using words and images to create positive thoughts (guided imagery). Physical activity, such as swimming, jogging, or walking. Tell your health care provider if your palpitations increase with activity. If you have chest pain or shortness of breath with activity, do not continue the activity until you are seen by your health care provider. Biofeedback. This is a method that helps you learn to use your mind to control things in your body, such as your heartbeat. Get plenty of rest and sleep. Keep a regular bed time. Do not use drugs, including cocaine or ecstasy. Do not use marijuana. Do not use any products that contain nicotine or tobacco. These  products include cigarettes, chewing tobacco, and vaping devices, such as e-cigarettes. If you need help quitting, ask your health care provider. General instructions Take over-the-counter and prescription medicines only as told by your health care provider. Keep all follow-up visits. This is important. These may include visits for further testing if palpitations do not go away or get worse. Contact a health care provider if: You continue to have a fast or irregular heartbeat for a long period of time. You notice that your palpitations occur more often. Get help right away if: You have chest pain or shortness of breath. You have a severe headache. You feel dizzy or you faint. These symptoms may represent a serious problem that is an emergency. Do not wait to see if the symptoms will go away. Get medical help right away. Call your local emergency services (911 in the [place] Do not drive yourself to the hospital. Summary Palpitations are feelings that your heartbeat is irregular or is faster than normal. It may feel like your heart is fluttering or skipping a beat. Palpitations may be caused by many things, including smoking, caffeine, alcohol, stress, certain medicines, and drugs. Further tests and a thorough medical history may be done to find the cause of your palpitations. Get help right away if you faint or have chest pain, shortness of breath, severe headache, or dizziness. This information is not intended to replace advice given to you by your health care provider. Make sure you discuss any questions you have with your health care provider. Document Revised: 09/17/2020 Document Reviewed: 09/17/2020 Elsevier Patient Education  [employer].

## 2021-05-20 NOTE — Progress Notes (Signed)
Chronic Care Management Pharmacy Note  06/02/2021 Name:  Grace [name] MRN:  [id number] DOB:  06-04-1960  Summary: Patient reports that she is concerned about not taking tizanidine due to memory issues. Patient reports that she is having heart palpitations.   Recommendations/Changes made from today's visit: Recommend patient receive COVID-19 vaccine.   Plan: At this time the patient is not interested in receiving the COVID-19 vaccine because Dr.Sater said to hold off until more research. I told patient I would follow up with PCP team for guidance.  Collaborated with PCP team for patient to be seen by Dr. Baird Cancer on today 05/20/2021.    Subjective: Grace [name] is an 61 y.o. year old female who is a primary patient of [name], MD.  The CCM team was consulted for assistance with disease management and care coordination needs.    Engaged with patient by telephone for follow up visit in response to provider referral for pharmacy case management and/or care coordination services.   Consent to Services:  The patient was given information about Chronic Care Management services, agreed to services, and gave verbal consent prior to initiation of services.  Please see initial visit note for detailed documentation.   Patient Care Team: [name], MD as PCP - General (Internal Medicine) Felecia Shelling, Nanine Means, MD (Neurology) Daneen Schick as Social Worker Mayford Knife, [facility] (Pharmacist)  Recent office visits: 04/28/2021 PCP OV  Recent consult visits: 04/16/2021 Neurology OV 03/05/2021 Ophthal[facility] visits: None in previous 6 months   Objective:  Lab Results  Component Value Date   CREATININE 0.73 04/28/2021   BUN 12 04/28/2021   GFRNONAA 96 04/16/2020   GFRAA 110 04/16/2020   NA 140 04/28/2021   K 4.3 04/28/2021   CALCIUM 9.5 04/28/2021   CO2 23 04/28/2021   GLUCOSE 87 04/28/2021    Lab Results  Component Value Date/Time   HGBA1C  5.6 04/16/2020 04:35 PM   HGBA1C 5.9 (H) 11/22/2019 05:10 PM    Last diabetic Eye exam: No results found for: HMDIABEYEEXA  Last diabetic Foot exam: No results found for: HMDIABFOOTEX   Lab Results  Component Value Date   CHOL 197 04/28/2021   HDL 59 04/28/2021   LDLCALC 99 04/28/2021   TRIG 229 (H) 04/28/2021   CHOLHDL 3.3 04/28/2021    Hepatic Function Latest Ref Rng & Units 04/28/2021 04/16/2020 10/10/2019  Total Protein 6.0 - 8.5 g/dL 6.7 7.6 6.7  Albumin 3.8 - 4.9 g/dL 4.4 4.7 4.4  AST 0 - 40 IU/L 25 20 32  ALT 0 - 32 IU/L 30 24 31   Alk Phosphatase 44 - 121 IU/L 108 111 104  Total Bilirubin 0.0 - 1.2 mg/dL 0.5 0.5 0.6  Bilirubin, Direct 0.00 - 0.40 mg/dL - - -    Lab Results  Component Value Date/Time   TSH 1.530 05/20/2021 05:03 PM   TSH 1.500 11/22/2019 05:10 PM   FREET4 1.46 11/22/2019 05:10 PM   FREET4 0.89 05/23/2018 03:29 PM    CBC Latest Ref Rng & Units 04/28/2021 11/22/2019 01/03/2019  WBC 3.4 - 10.8 x10E3/uL 5.0 10.3 6.9  Hemoglobin 11.1 - 15.9 g/dL 13.7 13.7 13.6  Hematocrit 34.0 - 46.6 % 40.0 40.3 39.9  Platelets 150 - 450 x10E3/uL 317 275 368    Lab Results  Component Value Date/Time   VD25OH 34.8 04/28/2021 10:29 AM   VD25OH 42.5 04/16/2020 04:35 PM    Clinical ASCVD: No  The 10-year ASCVD risk score (Arnett DK,  et al., 2019) is: 2.1%   Values used to calculate the score:     Age: 61 years     Sex: Female     Is Non-Hispanic African American: No     Diabetic: No     Tobacco smoker: No     Systolic Blood Pressure: 702 mmHg     Is BP treated: No     HDL Cholesterol: 59 mg/dL     Total Cholesterol: 197 mg/dL    Depression screen [facility] 2/9 05/20/2021 04/28/2021 10/16/2020  Decreased Interest 1 2 1   Down, Depressed, Hopeless 1 2 2   PHQ - 2 Score 2 4 3   Altered sleeping 2 3 3   Tired, decreased energy 2 3 3   Change in appetite 1 3 1   Feeling bad or failure about yourself  0 1 3  Trouble concentrating 2 3 1   Moving slowly or fidgety/restless 0 0 0   Suicidal thoughts 0 0 0  PHQ-9 Score 9 17 14   Difficult doing work/chores Somewhat difficult Somewhat difficult -  Some recent data might be hidden     Social History   Tobacco Use  Smoking Status Former   Packs/day: 0.25   Years: 8.00   Pack years: 2.00   Types: Cigarettes   Quit date: 07/01/1995   Years since quitting: 25.9  Smokeless Tobacco Never   BP Readings from Last 3 Encounters:  05/20/21 118/86  04/28/21 112/80  04/16/21 128/80   Pulse Readings from Last 3 Encounters:  05/20/21 95  04/28/21 76  04/16/21 87   Wt Readings from Last 3 Encounters:  05/20/21 223 lb (101.2 kg)  04/28/21 219 lb 9.6 oz (99.6 kg)  04/16/21 220 lb (99.8 kg)   BMI Readings from Last 3 Encounters:  05/20/21 35.78 kg/m  04/28/21 35.23 kg/m  04/16/21 35.51 kg/m    Assessment/Interventions: Review of patient past medical history, allergies, medications, health status, including review of consultants reports, laboratory and other test data, was performed as part of comprehensive evaluation and provision of chronic care management services.   SDOH:  (Social Determinants of Health) assessments and interventions performed: No SDOH Interventions    Flowsheet Row Most Recent Value  SDOH Interventions   Depression Interventions/Treatment  Counseling  [patient is in grief counseling with hospice]      SDOH Screenings   Alcohol Screen: Not on file  Depression (PHQ2-9): Medium Risk   PHQ-2 Score: 9  Financial Resource Strain: Low Risk    Difficulty of Paying Living Expenses: Not hard at all  Food Insecurity: Food Insecurity Present   Worried About Charity fundraiser in the Last Year: Sometimes true   Arboriculturist in the Last Year: Never true  Housing: Low Risk    Last Housing Risk Score: 0  Physical Activity: Inactive   Days of Exercise per Week: 0 days   Minutes of Exercise per Session: 0 min  Social Connections: Not on file  Stress: No Stress Concern Present   Feeling of  Stress : Not at all  Tobacco Use: Medium Risk   Smoking Tobacco Use: Former   Smokeless Tobacco Use: Never   Passive Exposure: Not on file  Transportation Needs: No Transportation Needs   Lack of Transportation (Medical): No   Lack of Transportation (Non-Medical): No    CCM Care Plan  Allergies  Allergen Reactions   Diclofenac Anaphylaxis   Lamictal [Lamotrigine] Shortness Of Breath, Itching and Swelling    Tongue swells   Mobic [Meloxicam]  Anaphylaxis   Ultram [Tramadol Hcl] Anaphylaxis   Nefazodone Itching and Swelling    Other reaction(s): Arthralgia (Joint Pain); Throat swelling, mouth sores   Betaseron [Interferon Beta-1b] Other (See Comments)    Caused avascular necrosis   Codeine Itching   Copaxone [Glatiramer Acetate] Other (See Comments)    Altered mental status: mental changes, anxiety, motion sickness   Gabapentin Itching    Itching if she takes more than 354m   Latex     Latex band-aids cause redness and tears your skin   Nitrofurantoin     Causing itching all over, tongue swelling, chest heaviness, swollen lips, cough, rapid HR, swollen eyelids, red eyes/gums, lips red/white per pt.   Oxcarbazepine Itching and Other (See Comments)    Dizziness, throat hurts   Teriflunomide Diarrhea    Hair loss, joint pains, elevated liver enzymes abugio (joint pains and liver issues)   Trazodone And Nefazodone     Mouth sores, tongue swelling   Tape Rash    Medications Reviewed Today     Reviewed by SGlendale Chard MD (Physician) on 05/20/21 at 1633  Med List Status: <None>   Medication Order Taking? Sig Documenting Provider Last Dose Status Informant  Ascorbic Acid (VITAMIN C) 1000 MG tablet [id number] Take 1,000 mg by mouth daily. [provider] Taking Active Self  aspirin EC 81 MG tablet [id number] Take 81 mg by mouth daily. [provider] Taking Active Self  AZO-CRANBERRY PO [id number] Take 1 capsule by mouth daily at 12 noon. [provider] Taking Active   baclofen (LIORESAL) 10 MG tablet [id number] Take 2 tablets (20 mg total) by mouth 3 (three) times daily. Sater, RNanine Means MD Taking Active   BLACK ELDERBERRY PO [id number] Take 300 mg by mouth in the morning and at bedtime. [provider] Taking Active   Cholecalciferol (VITAMIN D) 50 MCG (2000 UT) CAPS [id number] Take 2,000 Units by mouth daily.  [provider] Taking Active   CINNAMON PO [id number] Take 1,200 mg by mouth daily.  [provider] Taking Active   diphenhydrAMINE (BENADRYL) 25 MG tablet [id number] Take 25 mg by mouth 3 (three) times daily. Takes with Hydrocodone [provider] Taking Active Self  doxepin (SINEQUAN) 10 MG capsule [id number] TAKE 1 TO 2 CAPSULES AT BEDTIME Sater, RNanine Means MD Taking Active   EPINEPHrine 0.3 mg/0.3 mL IJ SOAJ injection [id number] Use as directed for life-threatening allergic reaction. KJiles Prows MD Taking Active   famotidine (PEPCID) 20 MG tablet [id number] TAKE 1 TABLET BY MOUTH TWICE A DLynnell Dike MD Taking Active   Fexofenadine-Pseudoephedrine (ALLEGRA-D PO) [id number] Take by mouth in the morning. [provider] Taking Active   Flaxseed, Linseed, (FLAX SEED OIL) 1000 MG CAPS [id number] Take 1,200 mg by mouth daily.  [provider] Taking Active   gabapentin (NEURONTIN) 300 MG capsule [id number] Take 1 capsule (300 mg total) by mouth 3 (three) times daily. Lomax, Amy, NP Taking Active   HYDROcodone-acetaminophen (NORCO/VICODIN) 5-325 MG tablet [id number] Take 1 tablet by mouth 3 (three) times daily as needed for moderate pain. Lomax, Amy, NP Taking Active   ipratropium (ATROVENT) 0.06 % nasal spray [id number] Use 2 sprays each nostril bid prn drainage SGlendale Chard MD Taking Active   L-Lysine 1000 MG TABS [id number] Take by mouth. As needed [provider] Taking Active   Lactobacillus (PROBIOTIC  ACIDOPHILUS PO) [id number]  Yes Take by mouth daily. Digestive Advantage BC   bacillus coagulans [provider] Taking Active   levocetirizine (XYZAL) 5 MG tablet [id number] Yes TAKE 1 TABLET BY MOUTH EVERY DAY IN THE Fredric Mare, MD Taking Active   LORazepam (ATIVAN) 1 MG tablet [id number] Yes Take 1 tablet (1 mg total) by mouth at bedtime. Sater, Nanine Means, MD Taking Active   Lutein-Bilberry (BILBERRY PLUS LUTEIN PO) [id number] Yes Take by mouth daily. [provider] Taking Active   Melatonin 10 MG TABS [id number] Yes Take 1 tablet by mouth at bedtime.  [provider] Taking Active Self  methylphenidate (CONCERTA) 27 MG PO CR tablet [id number] Yes Take 1 tablet (27 mg total) by mouth every morning. Lomax, Amy, NP Taking Active   Moringa 500 MG CAPS [id number] Yes Take 2 capsules by mouth daily. [provider] Taking Active   Multiple Vitamins-Minerals (HAIR/SKIN/NAILS/BIOTIN) TABS [id number] Yes Take by mouth daily.  [provider] Taking Active   Nutritional Supplements (NUTRITIONAL SUPPLEMENT PO) [id number] Yes Take 2.1 g by mouth in the morning and at bedtime. LION'S MANE [provider] Taking Active   nystatin-triamcinolone ointment Lilyan Gilford) [id number] Yes Apply 1 application topically 2 (two) times daily. Minette Brine, FNP Taking Active   rizatriptan (MAXALT-MLT) 10 MG disintegrating tablet [id number] Yes Take 1 tablet (10 mg total) by mouth as needed for migraine. May repeat in 2 hours if needed.  Max of 2 tablets in 24 hours, and 10 tablets per month Sater, Nanine Means, MD Taking Active   tiZANidine (ZANAFLEX) 2 MG tablet [id number] Yes TAKE UP TO 4 PILLS A DAY Sater, Nanine Means, MD Taking Active   Vitamin D, Ergocalciferol, (DRISDOL) 1.25 MG (50000 UNIT) CAPS capsule [id number] Yes ONE CAPSULE TWICE WEEKLY ON TUESDAYS/FRIDAYS [name], MD Taking Active   vitamin E 1000 UNIT capsule [id number] Yes vitamin E 670 mg (1,000 unit)  capsule   1 capsule every day by oral route. [provider] Taking Active   zolpidem (AMBIEN) 10 MG tablet [id number] Yes Take 10 mg by mouth at bedtime as needed. 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   Secondary open-angle glaucoma of right eye, 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   Prediabetes 01/01/2020   Vitamin D deficiency 01/01/2020   Vitamin B 12 deficiency 01/01/2020   At risk for diabetes mellitus 01/01/2020   Class 2 severe obesity due to excess calories with serious comorbidity and body mass index (BMI) of 35.0 to 35.9 in adult ([place]) 01/01/2020   Anosmia 11/15/2019   Disturbance of smell and taste 11/15/2019   Epiretinal membrane, right 07/03/2019   Depression 03/06/2019   Diverticulosis 06/14/2018   Hepatic steatosis 06/14/2018   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   Decreased pedal pulses 06/30/2016   Pain in both lower extremities 06/30/2016   Facial pain, atypical 06/08/2016   Fibroids 02/12/2016   Chest pain 07/07/2015   Dizziness and giddiness 07/07/2015  Leiomyoma of uterus 07/07/2015   Lower abdominal pain 07/07/2015   Hip pain 03/03/2015   Optic neuritis 06/27/2014   Multiple sclerosis ([place]) 06/26/2014   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/26/2014   Other fatigue 06/26/2014   Dysesthesia 06/26/2014   Depression with anxiety 06/26/2014   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    Insomnia 06/26/2014   Low serum vitamin D 06/26/2014    Immunization History  Administered Date(s) Administered   Tdap 03/21/2012, 04/16/2020    Conditions to be addressed/monitored:  Depression and Vitamin D Deficiency   Care Plan : [place]  Updates made by Mayford Knife, RPH since 06/02/2021 12:00 AM     Problem: Vitamin D Deficiency, Depression   Priority: High     Long-Range Goal: Disease Management   Recent Progress: On track  Note:   Current Barriers:  Unable to independently monitor therapeutic efficacy  Pharmacist Clinical Goal(s):  Patient will achieve adherence to monitoring guidelines and medication adherence to achieve therapeutic efficacy through collaboration with PharmD and provider.   Interventions: 1:1 collaboration with [name], MD regarding development and update of comprehensive plan of care as evidenced by provider attestation and co-signature Inter-disciplinary care team collaboration (see longitudinal plan of care) Comprehensive medication review performed; medication list updated in electronic medical record  Depression (Goal: Remission of Depression to PHQ-9 <5, 50% improvement in symptoms) -Controlled -Current treatment: Doxepin 10 mg capsule - taking one capsule at bedtime  Appropriate, Effective, Safe, Accessible Lorazepam 1 mg tablet by mouth at bedtime Appropriate, Effective, Safe, Accessible -Medications previously tried/failed: None noted  -PHQ9: 9, Previous - 14 10/2020 -Connected with Hospice for mental health support through grief counseling.        -Patient reports that she has sessions with the grief counselor every 2 weeks, they are 45 minutes to an hour  -Educated on Benefits of cognitive-behavioral therapy with or without medication -She started back with counseling on Thursday or Friday of next week.  -Counseled on the importance of her counseling sessions  Vitamin D Deficiency (Goal: Improve Vitamin D  level to greater than  ) -Controlled -Current treatment  Vitamin D 50,000 Unit - twice weekly on Tuesdays and fridays Appropriate, Effective, Safe, Accessible Vitamin D  2000 units once per day Appropriate, Effective, Safe, Accessible -Recommended to continue current medication -Collaborate with PCP team to have patients labs completed during next office visit.   Patient Goals/Self-Care Activities Patient will:  - take medications as prescribed as evidenced by patient report and record review  Follow Up Plan: The patient has been provided with contact information for the care management team and has been advised to call with any health related questions or concerns.       Medication Assistance: None required.  Patient affirms current coverage meets needs.  Compliance/Adherence/Medication fill history: Care Gaps: COVID-19 Booster   Star-Rating Drugs: Not currently taking any star rating medication   Patient's preferred pharmacy is:  [id number] [place] [address]Phone: [phone]:: [phone] Uses pill box? No - patient does not use a pill box at this time  Pt endorses 90% compliance  We discussed: Benefits of medication synchronization, packaging and delivery as well as enhanced pharmacist oversight with [employer]. Patient decided to: Continue current medication management strategy  Care Plan and Follow Up Patient Decision:  Patient agrees to Care Plan and Follow-up.  Plan: The patient has been provided with contact information for the care management  team and has been advised to call with any health related questions or concerns.   [name], CPP, PharmD Clinical Pharmacist Practitioner [facility] [phone]

## 2021-05-20 NOTE — Progress Notes (Signed)
Grace [name],acting as a Education administrator for Grace Greenland, MD.,have documented all relevant documentation on the behalf of Grace Greenland, MD,as directed by  Grace Greenland, MD while in the presence of Grace Greenland, MD.  This visit occurred during the SARS-CoV-2 public health emergency.  Safety protocols were in place, including screening questions prior to the visit, additional usage of staff PPE, and extensive cleaning of exam room while observing appropriate contact time as indicated for disinfecting solutions.  Subjective:     Patient ID: Grace [name] , female    DOB: [birth year] , 61 y.o.   MRN: [id number]   Chief Complaint  Patient presents with   Palpitations    HPI  She presents today for further evaluation of palpitations. She had virtual visit today with CCM  pharmacist and mentioned an increased frequency of palpitations. There is associated chest pain, SOB. States her sx cause her to catch her breath. She is not sure what may have triggered her sx. No associated diaphoresis.   Palpitations  This is a new problem. The current episode started 1 to 4 weeks ago. The problem occurs constantly (worse when up moving around). Exacerbated by: movement. Associated symptoms include chest pain, dizziness (when stands up) and shortness of breath. Pertinent negatives include no nausea. She has tried bed rest for the symptoms. The treatment provided mild relief.    Past Medical History:  Diagnosis Date   Allergy    Anxiety    Avascular necrosis (HCC)    Back pain    Cancer (HCC)    Chest pain    Chewing difficulty    Chronic fatigue syndrome    Colon polyp    pre-cancerous   Complication of anesthesia    patient states she requires more anesthesia   Depression    Disorder of soft tissue    Diverticulosis    Dizziness    Edema, lower extremity    Fatty liver    Fibromyalgia    GERD (gastroesophageal reflux disease)    H/O breast biopsy    pre- cancerous cells    Hand fracture, left    9/87   Heart palpitations    Heart valve disease    History of Holter monitoring    Hyperlipidemia    Insomnia    Joint pain    Lactose intolerance    Lhermitte's syndrome    Migraines    Multiple food allergies    Multiple sclerosis (HCC)    dx in 11/1996   Multiple sclerosis (HCC)    Neuromuscular disorder (HCC)    Neuropathy    Numbness and tingling in hands    Obesity    Optic neuritis    OSA (obstructive sleep apnea)    no longer have OSA since stopped taking a MS medication   Osteoarthritis    Otitis media    Palpitations    Prediabetes    Raynaud's phenomenon    Shortness of breath    Sleep apnea    Due to medication that she no longer takes.   Swallowing difficulty    Vaginitis and vulvovaginitis    Vitamin B12 deficiency    Vitamin D deficiency      Family History  Problem Relation Age of Onset   Pancreatic cancer Mother    Stroke Mother    Hypertension Mother    Hyperlipidemia Mother    Heart disease Mother    Cancer Mother    Depression Mother  Anxiety disorder Mother    Alcoholism Mother    Testicular cancer Brother    Colon cancer Maternal Uncle    Pancreatic cancer Paternal Uncle    Prostate cancer Maternal Grandfather    Colon polyps Father    Diabetes Father    Hypertension Father    Hyperlipidemia Father    Kidney disease Father    Sleep apnea Father    Obesity Father      Current Outpatient Medications:    Ascorbic Acid (VITAMIN C) 1000 MG tablet, Take 1,000 mg by mouth daily., Disp: , Rfl:    aspirin EC 81 MG tablet, Take 81 mg by mouth daily., Disp: , Rfl:    AZO-CRANBERRY PO, Take 1 capsule by mouth daily at 12 noon., Disp: , Rfl:    baclofen (LIORESAL) 10 MG tablet, Take 2 tablets (20 mg total) by mouth 3 (three) times daily., Disp: 180 tablet, Rfl: 11   BLACK ELDERBERRY PO, Take 300 mg by mouth in the morning and at bedtime., Disp: , Rfl:    Cholecalciferol (VITAMIN D) 50 MCG (2000 UT) CAPS, Take  2,000 Units by mouth daily. , Disp: , Rfl:    CINNAMON PO, Take 1,200 mg by mouth daily. , Disp: , Rfl:    diphenhydrAMINE (BENADRYL) 25 MG tablet, Take 25 mg by mouth 3 (three) times daily. Takes with Hydrocodone, Disp: , Rfl:    doxepin (SINEQUAN) 10 MG capsule, TAKE 1 TO 2 CAPSULES AT BEDTIME, Disp: 180 capsule, Rfl: 2   EPINEPHrine 0.3 mg/0.3 mL IJ SOAJ injection, Use as directed for life-threatening allergic reaction., Disp: 1 each, Rfl: 3   famotidine (PEPCID) 20 MG tablet, TAKE 1 TABLET BY MOUTH TWICE A DAY, Disp: 180 tablet, Rfl: 1   Fexofenadine-Pseudoephedrine (ALLEGRA-D PO), Take by mouth in the morning., Disp: , Rfl:    Flaxseed, Linseed, (FLAX SEED OIL) 1000 MG CAPS, Take 1,200 mg by mouth daily. , Disp: , Rfl:    gabapentin (NEURONTIN) 300 MG capsule, Take 1 capsule (300 mg total) by mouth 3 (three) times daily., Disp: 270 capsule, Rfl: 1   HYDROcodone-acetaminophen (NORCO/VICODIN) 5-325 MG tablet, Take 1 tablet by mouth 3 (three) times daily as needed for moderate pain., Disp: 90 tablet, Rfl: 0   ipratropium (ATROVENT) 0.06 % nasal spray, Use 2 sprays each nostril bid prn drainage, Disp: 30 mL, Rfl: 5   L-Lysine 1000 MG TABS, Take by mouth. As needed, Disp: , Rfl:    Lactobacillus (PROBIOTIC ACIDOPHILUS PO), Take by mouth daily. Digestive Advantage BC   bacillus coagulans, Disp: , Rfl:    levocetirizine (XYZAL) 5 MG tablet, TAKE 1 TABLET BY MOUTH EVERY DAY IN THE EVENING, Disp: 90 tablet, Rfl: 2   LORazepam (ATIVAN) 1 MG tablet, Take 1 tablet (1 mg total) by mouth at bedtime., Disp: 30 tablet, Rfl: 5   Lutein-Bilberry (BILBERRY PLUS LUTEIN PO), Take by mouth daily., Disp: , Rfl:    Magnesium Glycinate 100 MG CAPS, Take 100 mg by mouth at bedtime., Disp: 90 capsule, Rfl: 1   Melatonin 10 MG TABS, Take 1 tablet by mouth at bedtime. , Disp: , Rfl:    methylphenidate (CONCERTA) 27 MG PO CR tablet, Take 1 tablet (27 mg total) by mouth every morning., Disp: 30 tablet, Rfl: 0   Moringa 500  MG CAPS, Take 2 capsules by mouth daily., Disp: , Rfl:    Multiple Vitamins-Minerals (HAIR/SKIN/NAILS/BIOTIN) TABS, Take by mouth daily. , Disp: , Rfl:    Nutritional Supplements (NUTRITIONAL SUPPLEMENT  PO), Take 2.1 g by mouth in the morning and at bedtime. LION'S MANE, Disp: , Rfl:    nystatin-triamcinolone ointment (MYCOLOG), Apply 1 application topically 2 (two) times daily., Disp: 30 g, Rfl: 2   rizatriptan (MAXALT-MLT) 10 MG disintegrating tablet, Take 1 tablet (10 mg total) by mouth as needed for migraine. May repeat in 2 hours if needed.  Max of 2 tablets in 24 hours, and 10 tablets per month, Disp: 10 tablet, Rfl: 11   tiZANidine (ZANAFLEX) 2 MG tablet, TAKE UP TO 4 PILLS A DAY, Disp: 360 tablet, Rfl: 1   Vitamin D, Ergocalciferol, (DRISDOL) 1.25 MG (50000 UNIT) CAPS capsule, ONE CAPSULE TWICE WEEKLY ON TUESDAYS/FRIDAYS, Disp: 24 capsule, Rfl: 2   vitamin E 1000 UNIT capsule, vitamin E 670 mg (1,000 unit) capsule   1 capsule every day by oral route., Disp: , Rfl:    zolpidem (AMBIEN) 10 MG tablet, Take 10 mg by mouth at bedtime as needed. For sleep, Disp: , Rfl:    Allergies  Allergen Reactions   Diclofenac Anaphylaxis   Lamictal [Lamotrigine] Shortness Of Breath, Itching and Swelling    Tongue swells   Mobic [Meloxicam] Anaphylaxis   Ultram [Tramadol Hcl] Anaphylaxis   Nefazodone Itching and Swelling    Other reaction(s): Arthralgia (Joint Pain); Throat swelling, mouth sores   Betaseron [Interferon Beta-1b] Other (See Comments)    Caused avascular necrosis   Codeine Itching   Copaxone [Glatiramer Acetate] Other (See Comments)    Altered mental status: mental changes, anxiety, motion sickness   Gabapentin Itching    Itching if she takes more than 300mg    Latex     Latex band-aids cause redness and tears your skin   Nitrofurantoin     Causing itching all over, tongue swelling, chest heaviness, swollen lips, cough, rapid HR, swollen eyelids, red eyes/gums, lips red/white per pt.    Oxcarbazepine Itching and Other (See Comments)    Dizziness, throat hurts   Teriflunomide Diarrhea    Hair loss, joint pains, elevated liver enzymes abugio (joint pains and liver issues)   Trazodone And Nefazodone     Mouth sores, tongue swelling   Tape Rash     Review of Systems  Constitutional: Negative.   Respiratory:  Positive for shortness of breath.   Cardiovascular:  Positive for chest pain and palpitations.  Gastrointestinal: Negative.  Negative for nausea.  Neurological:  Positive for dizziness (when stands up).  Psychiatric/Behavioral: Negative.      Today's Vitals   05/20/21 1603  BP: 118/86  Pulse: 95  Temp: 98.2 F (36.8 C)  Weight: 223 lb (101.2 kg)  Height: 5' 6.2" (1.681 m)  PainSc: 0-No pain   Body mass index is 35.78 kg/m.  Wt Readings from Last 3 Encounters:  05/20/21 223 lb (101.2 kg)  04/28/21 219 lb 9.6 oz (99.6 kg)  04/16/21 220 lb (99.8 kg)    BP Readings from Last 3 Encounters:  05/20/21 118/86  04/28/21 112/80  04/16/21 128/80    Objective:  Physical Exam Vitals and nursing note reviewed.  Constitutional:      Appearance: Normal appearance.  HENT:     Head: Normocephalic and atraumatic.     Nose:     Comments: Masked     Mouth/Throat:     Comments: Masked  Eyes:     Extraocular Movements: Extraocular movements intact.  Cardiovascular:     Rate and Rhythm: Normal rate and regular rhythm.     Heart sounds: Normal heart sounds.  Pulmonary:     Effort: Pulmonary effort is normal.     Breath sounds: Normal breath sounds.  Musculoskeletal:     Cervical back: Normal range of motion.  Skin:    General: Skin is warm.  Neurological:     General: No focal deficit present.     Mental Status: She is alert.  Psychiatric:        Mood and Affect: Mood normal.        Behavior: Behavior normal.        Assessment And Plan:     1. Heart palpitations Comments: Intermittent, yet increasing in frequency. EKG performed, NSR w/o acute  changes. Advised to c/w Mg supplementation. I will order Echo.  - EKG 12-Lead - ECHOCARDIOGRAM COMPLETE; Future - TSH  2. Shortness of breath Comments: I will check BNP. Again, I will also refer her for echocardiogram.  - Brain natriuretic peptide  3. Retention cyst of paranasal sinus Comments: MRI brain results reviewed in full detail. Pt advised that no treatment is likely needed at this time. However, if needed, I can refer to ENT prn.   4. Class 2 severe obesity due to excess calories with serious comorbidity and body mass index (BMI) of 35.0 to 35.9 in adult [facility]) Comments: She is encouraged to strive for BMI less than 30 to decrease cardiac risk. Advised to aim for at least 150 minutes of exercise per week.   5. COVID-19 vaccination declined   Patient was given opportunity to ask questions. Patient verbalized understanding of the plan and was able to repeat key elements of the plan. All questions were answered to their satisfaction.   I, Grace Greenland, MD, have reviewed all documentation for this visit. The documentation on 05/24/21 for the exam, diagnosis, procedures, and orders are all accurate and complete.   IF YOU HAVE BEEN REFERRED TO A SPECIALIST, IT MAY TAKE 1-2 WEEKS TO SCHEDULE/PROCESS THE REFERRAL. IF YOU HAVE NOT HEARD FROM US/SPECIALIST IN TWO WEEKS, PLEASE GIVE [place] A CALL AT [phone].   THE PATIENT IS ENCOURAGED TO PRACTICE SOCIAL DISTANCING DUE TO THE COVID-19 PANDEMIC.

## 2021-05-21 LAB — BRAIN NATRIURETIC PEPTIDE: BNP: 88.7 pg/mL (ref 0.0–100.0)

## 2021-05-21 LAB — TSH: TSH: 1.53 u[IU]/mL (ref 0.450–4.500)

## 2021-05-24 ENCOUNTER — Other Ambulatory Visit: Payer: Self-pay | Admitting: Internal Medicine

## 2021-05-25 ENCOUNTER — Telehealth: Payer: Self-pay

## 2021-05-25 ENCOUNTER — Ambulatory Visit: Payer: Medicare Other

## 2021-05-25 DIAGNOSIS — E559 Vitamin D deficiency, unspecified: Secondary | ICD-10-CM

## 2021-05-25 DIAGNOSIS — R002 Palpitations: Secondary | ICD-10-CM

## 2021-05-25 DIAGNOSIS — G35 Multiple sclerosis: Secondary | ICD-10-CM

## 2021-05-25 DIAGNOSIS — F3342 Major depressive disorder, recurrent, in full remission: Secondary | ICD-10-CM

## 2021-05-25 NOTE — Telephone Encounter (Signed)
°  Care Management   Follow Up Note   05/25/2021 Name: Grace [name] MRN: [id number] DOB: 12-Oct-1960   Referred by: [name], MD Reason for referral : Chronic Care Management (Unsuccessful call)   An unsuccessful telephone outreach was attempted today. The patient was referred to the case management team for assistance with care management and care coordination. SW left a HIPAA compliant voice message requesting a return call.  Follow Up Plan: The care management team will reach out to the patient again over the next 21 days.   Daneen Schick, BSW, CDP Social Worker, Certified Dementia Practitioner [place] [facility] [place] [facility] [phone]

## 2021-05-25 NOTE — Patient Instructions (Signed)
Social Worker Visit Information  Goals we discussed today:   Goals Addressed             This Visit's Progress    Quality of Life Maintained   On track    Timeframe:  Long-Range Goal Priority:  Medium Start Date: 6.8.22                                          Next planned outreach: 2.7.23  Patient Goals/Self-Care Activities patient will:  -Work with PharmD to obtain an Epi Pen -Attend all upcoming scheduled appointments -Contact SW as needed prior to next scheduled call        Patient verbalizes understanding of instructions and care plan provided today and agrees to view in [place]. Active MyChart status confirmed with patient.    Follow Up Plan: SW will follow up with patient by phone over the next month  Daneen Schick, BSW, CDP Social Worker, Certified Dementia Practitioner [place] [facility] [facility] [phone]

## 2021-05-25 NOTE — Chronic Care Management (AMB) (Signed)
Chronic Care Management    Social Work Note  05/25/2021 Name: Grace [name] MRN: [id number] DOB: 26-Apr-1961  Grace [name] is a 61 y.o. year old female who is a primary care patient of [name], MD. The CCM team was consulted to assist the patient with chronic disease management and/or care coordination needs related to:  MS, Recurrent Mjor Depression, Vitamin D Deficiency .   Engaged with patient by telephone for follow up visit in response to provider referral for social work chronic care management and care coordination services.   Consent to Services:  The patient was given information about Chronic Care Management services, agreed to services, and gave verbal consent prior to initiation of services.  Please see initial visit note for detailed documentation.   Patient agreed to services and consent obtained.   Assessment: Review of patient past medical history, allergies, medications, and health status, including review of relevant consultants reports was performed today as part of a comprehensive evaluation and provision of chronic care management and care coordination services.     SDOH (Social Determinants of Health) assessments and interventions performed:    Advanced Directives Status: Not addressed in this encounter.  CCM Care Plan  Allergies  Allergen Reactions   Diclofenac Anaphylaxis   Lamictal [Lamotrigine] Shortness Of Breath, Itching and Swelling    Tongue swells   Mobic [Meloxicam] Anaphylaxis   Ultram [Tramadol Hcl] Anaphylaxis   Nefazodone Itching and Swelling    Other reaction(s): Arthralgia (Joint Pain); Throat swelling, mouth sores   Betaseron [Interferon Beta-1b] Other (See Comments)    Caused avascular necrosis   Codeine Itching   Copaxone [Glatiramer Acetate] Other (See Comments)    Altered mental status: mental changes, anxiety, motion sickness   Gabapentin Itching    Itching if she takes more than 300mg    Latex     Latex  band-aids cause redness and tears your skin   Nitrofurantoin     Causing itching all over, tongue swelling, chest heaviness, swollen lips, cough, rapid HR, swollen eyelids, red eyes/gums, lips red/white per pt.   Oxcarbazepine Itching and Other (See Comments)    Dizziness, throat hurts   Teriflunomide Diarrhea    Hair loss, joint pains, elevated liver enzymes abugio (joint pains and liver issues)   Trazodone And Nefazodone     Mouth sores, tongue swelling   Tape Rash    Outpatient Encounter Medications as of 05/25/2021  Medication Sig   Ascorbic Acid (VITAMIN C) 1000 MG tablet Take 1,000 mg by mouth daily.   aspirin EC 81 MG tablet Take 81 mg by mouth daily.   AZO-CRANBERRY PO Take 1 capsule by mouth daily at 12 noon.   baclofen (LIORESAL) 10 MG tablet Take 2 tablets (20 mg total) by mouth 3 (three) times daily.   BLACK ELDERBERRY PO Take 300 mg by mouth in the morning and at bedtime.   Cholecalciferol (VITAMIN D) 50 MCG (2000 UT) CAPS Take 2,000 Units by mouth daily.    CINNAMON PO Take 1,200 mg by mouth daily.    diphenhydrAMINE (BENADRYL) 25 MG tablet Take 25 mg by mouth 3 (three) times daily. Takes with Hydrocodone   doxepin (SINEQUAN) 10 MG capsule TAKE 1 TO 2 CAPSULES AT BEDTIME   EPINEPHrine 0.3 mg/0.3 mL IJ SOAJ injection Use as directed for life-threatening allergic reaction.   famotidine (PEPCID) 20 MG tablet TAKE 1 TABLET BY MOUTH TWICE A DAY   Fexofenadine-Pseudoephedrine (ALLEGRA-D PO) Take by mouth in the morning.   Flaxseed,  Linseed, (FLAX SEED OIL) 1000 MG CAPS Take 1,200 mg by mouth daily.    gabapentin (NEURONTIN) 300 MG capsule Take 1 capsule (300 mg total) by mouth 3 (three) times daily.   HYDROcodone-acetaminophen (NORCO/VICODIN) 5-325 MG tablet Take 1 tablet by mouth 3 (three) times daily as needed for moderate pain.   ipratropium (ATROVENT) 0.06 % nasal spray Use 2 sprays each nostril bid prn drainage   L-Lysine 1000 MG TABS Take by mouth. As needed    Lactobacillus (PROBIOTIC ACIDOPHILUS PO) Take by mouth daily. Digestive Advantage BC   bacillus coagulans   levocetirizine (XYZAL) 5 MG tablet TAKE 1 TABLET BY MOUTH EVERY DAY IN THE EVENING   LORazepam (ATIVAN) 1 MG tablet Take 1 tablet (1 mg total) by mouth at bedtime.   Lutein-Bilberry (BILBERRY PLUS LUTEIN PO) Take by mouth daily.   Magnesium Glycinate 100 MG CAPS Take 100 mg by mouth at bedtime.   Melatonin 10 MG TABS Take 1 tablet by mouth at bedtime.    methylphenidate (CONCERTA) 27 MG PO CR tablet Take 1 tablet (27 mg total) by mouth every morning.   Moringa 500 MG CAPS Take 2 capsules by mouth daily.   Multiple Vitamins-Minerals (HAIR/SKIN/NAILS/BIOTIN) TABS Take by mouth daily.    Nutritional Supplements (NUTRITIONAL SUPPLEMENT PO) Take 2.1 g by mouth in the morning and at bedtime. LION'S MANE   nystatin-triamcinolone ointment (MYCOLOG) Apply 1 application topically 2 (two) times daily.   rizatriptan (MAXALT-MLT) 10 MG disintegrating tablet Take 1 tablet (10 mg total) by mouth as needed for migraine. May repeat in 2 hours if needed.  Max of 2 tablets in 24 hours, and 10 tablets per month   tiZANidine (ZANAFLEX) 2 MG tablet TAKE UP TO 4 PILLS A DAY   Vitamin D, Ergocalciferol, (DRISDOL) 1.25 MG (50000 UNIT) CAPS capsule ONE CAPSULE TWICE WEEKLY ON TUESDAYS/FRIDAYS   vitamin E 1000 UNIT capsule vitamin E 670 mg (1,000 unit) capsule   1 capsule every day by oral route.   zolpidem (AMBIEN) 10 MG tablet Take 10 mg by mouth at bedtime as needed. For sleep   No facility-administered encounter medications on file as of 05/25/2021.    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   Secondary open-angle glaucoma of right eye, 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   Prediabetes 01/01/2020   Vitamin D deficiency 01/01/2020   Vitamin B 12 deficiency 01/01/2020   At risk for diabetes mellitus 01/01/2020   Class 2 severe obesity due to excess calories with serious comorbidity and body mass index (BMI) of 35.0 to 35.9 in adult ([place]) 01/01/2020   Anosmia 11/15/2019   Disturbance of smell and taste 11/15/2019   Epiretinal membrane, right 07/03/2019   Depression 03/06/2019   Diverticulosis 06/14/2018   Hepatic steatosis 06/14/2018   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   Decreased pedal pulses 06/30/2016   Pain in both lower extremities 06/30/2016   Facial pain, atypical 06/08/2016   Fibroids 02/12/2016   Chest pain 07/07/2015   Dizziness and giddiness 07/07/2015   Leiomyoma  of uterus 07/07/2015   Lower abdominal pain 07/07/2015   Hip pain 03/03/2015   Optic neuritis 06/27/2014   Multiple sclerosis ([place]) 06/26/2014   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/26/2014   Other fatigue 06/26/2014   Dysesthesia 06/26/2014   Depression with anxiety 06/26/2014   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   Insomnia 06/26/2014   Low serum vitamin D 06/26/2014    Conditions to be addressed/monitored:  MS, Recurrent Major Depression, Vitamin D Deficiency  Care Plan : Social Work [place]  Updates made by Daneen Schick since 05/25/2021 12:00 AM     Problem: Quality of Life (General Plan of Care)      Long-Range Goal: Quality of Life Maintained   Start Date: 10/15/2020  This Visit's Progress: On track  Recent Progress: On track  Priority: Medium  Note:   Current Barriers:  Chronic disease management support and education needs related to  MS, Depression, and Class 1 Obesity   Recent loss  of spouse  Social Worker Clinical Goal(s):  patient will work with SW to identify and address any acute and/or chronic care coordination needs related to the self health management of  MS, Depression, and Class 1 Obesity   Patient will participate in grief share with local church to assist with building a support network following the loss of her spouse  SW Interventions:  Inter-disciplinary care team collaboration (see longitudinal plan of care) Collaboration with [name], MD regarding development and update of comprehensive plan of care as evidenced by provider attestation and co-signature Telephonic visit completed with the patient to assess for care coordination needs Discussed the patient has been experiencing heart palpitations which she believes is due to stress - patient has seen her primary care provider about the palpitations and has an appointment on 1.17 for an echocardiogram  Determined the patient does not have transportation barriers and will plan to drive herself on [address] Reviewed the patient continues to work with social security since her husbands passing and is concerned she may lose her benefit due to them requiring her to re-apply for "spousal disability" Discussed the patient has an appointment at the end of this month with a chiropractor they are requiring her to see  Patient reports she feels the stress with social security is what is leading to her heart palpitations Discussed plans for SW to contact the patient in February to assess outcome of exam and determine if resources are needed Reviewed past care plan with the patient to confirm patient did receive the mailed resource information regarding food insecurity - patient has been utilizing some of these options Encouraged the patient to contact SW as needed prior to next scheduled call Performed chart review to note pharmacy team is assisting with obtaining an EpiPen on behalf of the patient  Patient  Goals/Self-Care Activities patient will:  -Work with PharmD to obtain an Epi Pen -Attend all upcoming scheduled appointments -Contact SW as needed prior to next scheduled call  Follow Up Plan:  SW will follow up with the patient on 2.7.23       Follow Up Plan: SW will follow up with patient by phone over the next month.      Daneen Schick, BSW, CDP Social Worker, Certified Dementia Practitioner [place] [facility] [place] [facility] [phone]

## 2021-05-26 ENCOUNTER — Ambulatory Visit (HOSPITAL_COMMUNITY)
Admission: RE | Admit: 2021-05-26 | Discharge: 2021-05-26 | Disposition: A | Discharge status: Home / Self Care | Payer: Medicare Other | Source: Ambulatory Visit | Attending: Internal Medicine | Admitting: Internal Medicine

## 2021-05-26 ENCOUNTER — Other Ambulatory Visit: Payer: Self-pay | Admitting: Family Medicine

## 2021-05-26 ENCOUNTER — Other Ambulatory Visit: Payer: Self-pay

## 2021-05-26 DIAGNOSIS — R0602 Shortness of breath: Secondary | ICD-10-CM

## 2021-05-26 DIAGNOSIS — R002 Palpitations: Secondary | ICD-10-CM | POA: Insufficient documentation

## 2021-05-26 DIAGNOSIS — Z79899 Other long term (current) drug therapy: Secondary | ICD-10-CM | POA: Insufficient documentation

## 2021-05-26 DIAGNOSIS — R079 Chest pain, unspecified: Secondary | ICD-10-CM | POA: Diagnosis not present

## 2021-05-26 LAB — ECHOCARDIOGRAM COMPLETE
Area-P 1/2: 3.08 cm2
Calc EF: 66.2 %
S' Lateral: 3 cm
Single Plane A2C EF: 66.9 %
Single Plane A4C EF: 65.4 %

## 2021-05-27 MED ORDER — METHYLPHENIDATE HCL ER (OSM) 27 MG PO TBCR: 27.0000 mg | EXTENDED_RELEASE_TABLET | ORAL | 0 refills | Status: DC

## 2021-05-27 MED ORDER — HYDROCODONE-ACETAMINOPHEN 5-325 MG PO TABS: 1.0000 | ORAL_TABLET | Freq: Three times a day (TID) | ORAL | 0 refills | Status: DC | PRN

## 2021-05-27 NOTE — Telephone Encounter (Signed)
Received refill request for methylphenidate and Norco.  Last OV was on 04/16/21.  Next OV is scheduled for 08/12/21 .  Last RX for Norco was written on 04/16/21 for 90 tabs.   Last RX for methylphenidate was written on 04/16/21 for 30 tabs.   [place] [facility] Database has been reviewed.

## 2021-06-02 ENCOUNTER — Encounter: Payer: Self-pay | Admitting: Internal Medicine

## 2021-06-02 NOTE — Patient Instructions (Signed)
Visit Information It was great speaking with you today!  Please let me know if you have any questions about our visit.   Goals Addressed             This Visit's Progress    Manage My Medicine       Timeframe:  Long-Range Goal Priority:  High Start Date:                             Expected End Date:                       Follow Up Date 11/20/2021   In Progress: - call for medicine refill 2 or 3 days before it runs out - call if I am sick and can't take my medicine - keep a list of all the medicines I take; vitamins and herbals too - use a pillbox to sort medicine    Why is this important?   These steps will help you keep on track with your medicines.   Notes:  Recommend patient call with any questions        Patient Care Plan: CCM Pharmacy Care Plan     Problem Identified: Vitamin D Deficiency, Depression   Priority: High     Long-Range Goal: Disease Management   Recent Progress: On track  Note:   Current Barriers:  Unable to independently monitor therapeutic efficacy  Pharmacist Clinical Goal(s):  Patient will achieve adherence to monitoring guidelines and medication adherence to achieve therapeutic efficacy through collaboration with PharmD and provider.   Interventions: 1:1 collaboration with [name], MD regarding development and update of comprehensive plan of care as evidenced by provider attestation and co-signature Inter-disciplinary care team collaboration (see longitudinal plan of care) Comprehensive medication review performed; medication list updated in electronic medical record  Depression (Goal: Remission of Depression to PHQ-9 <5, 50% improvement in symptoms) -Controlled -Current treatment: Doxepin 10 mg capsule - taking one capsule at bedtime  Appropriate, Effective, Safe, Accessible Lorazepam 1 mg tablet by mouth at bedtime Appropriate, Effective, Safe, Accessible -Medications previously tried/failed: None noted  -PHQ9: 9, Previous -  14 10/2020 -Connected with Hospice for mental health support through grief counseling.        -Patient reports that she has sessions with the grief counselor every 2 weeks, they are 45 minutes to an hour  -Educated on Benefits of cognitive-behavioral therapy with or without medication -She started back with counseling on Thursday or Friday of next week.  -Counseled on the importance of her counseling sessions  Vitamin D Deficiency (Goal: Improve Vitamin D level to greater than  ) -Controlled -Current treatment  Vitamin D 50,000 Unit - twice weekly on Tuesdays and fridays Appropriate, Effective, Safe, Accessible Vitamin  2000 units once per day  Appropriate, Effective, Safe, Accessible  -Recommended to continue current medication  Patient Goals/Self-Care Activities Patient will:  - take medications as prescribed as evidenced by patient report and record review  Follow Up Plan: The patient has been provided with contact information for the care management team and has been advised to call with any health related questions or concerns.       Patient agreed to services and verbal consent obtained.   The patient verbalized understanding of instructions, educational materials, and care plan provided today and agreed to receive a mailed copy of patient instructions, educational materials, and care plan.   [name], PharmD Clinical Pharmacist  [place] [phone]

## 2021-06-03 ENCOUNTER — Telehealth: Payer: Medicare Other

## 2021-06-06 ENCOUNTER — Other Ambulatory Visit: Payer: Self-pay

## 2021-06-09 DIAGNOSIS — F3342 Major depressive disorder, recurrent, in full remission: Secondary | ICD-10-CM

## 2021-06-16 ENCOUNTER — Telehealth: Payer: Medicare Other

## 2021-06-16 ENCOUNTER — Telehealth: Payer: Self-pay

## 2021-06-16 NOTE — Telephone Encounter (Signed)
°  Care Management   Follow Up Note   06/16/2021 Name: Grace [name] MRN: [id number] DOB: 1961-01-31   Referred by: [name], MD Reason for referral : Chronic Care Management (Unsuccessful call)   An unsuccessful telephone outreach was attempted today. The patient was referred to the case management team for assistance with care management and care coordination.   Follow Up Plan: The care management team will reach out to the patient again over the next 21 days.   Daneen Schick, BSW, CDP Social Worker, Certified Dementia Practitioner [place] [facility] [phone]

## 2021-06-30 ENCOUNTER — Ambulatory Visit (INDEPENDENT_AMBULATORY_CARE_PROVIDER_SITE_OTHER): Payer: Medicare Other

## 2021-06-30 DIAGNOSIS — E559 Vitamin D deficiency, unspecified: Secondary | ICD-10-CM

## 2021-06-30 DIAGNOSIS — F3342 Major depressive disorder, recurrent, in full remission: Secondary | ICD-10-CM

## 2021-06-30 DIAGNOSIS — R7303 Prediabetes: Secondary | ICD-10-CM

## 2021-06-30 DIAGNOSIS — G35 Multiple sclerosis: Secondary | ICD-10-CM

## 2021-06-30 NOTE — Chronic Care Management (AMB) (Signed)
Chronic Care Management    Social Work Note  06/30/2021 Name: Grace [name] MRN: [id number] DOB: Mar 02, 1961  Grace [name] is a 61 y.o. year old female who is a primary care patient of [name], MD. The CCM team was consulted to assist the patient with chronic disease management and/or care coordination needs related to:  MS, Recurrent Major Depression, Class 1 Obesity, Prediabetes .   Engaged with patient by telephone for follow up visit in response to provider referral for social work chronic care management and care coordination services.   Consent to Services:  The patient was given information about Chronic Care Management services, agreed to services, and gave verbal consent prior to initiation of services.  Please see initial visit note for detailed documentation.   Patient agreed to services and consent obtained.   Assessment: Review of patient past medical history, allergies, medications, and health status, including review of relevant consultants reports was performed today as part of a comprehensive evaluation and provision of chronic care management and care coordination services.     SDOH (Social Determinants of Health) assessments and interventions performed:    Advanced Directives Status: Not addressed in this encounter.  CCM Care Plan  Allergies  Allergen Reactions   Diclofenac Anaphylaxis   Lamictal [Lamotrigine] Shortness Of Breath, Itching and Swelling    Tongue swells   Mobic [Meloxicam] Anaphylaxis   Ultram [Tramadol Hcl] Anaphylaxis   Nefazodone Itching and Swelling    Other reaction(s): Arthralgia (Joint Pain); Throat swelling, mouth sores   Betaseron [Interferon Beta-1b] Other (See Comments)    Caused avascular necrosis   Codeine Itching   Copaxone [Glatiramer Acetate] Other (See Comments)    Altered mental status: mental changes, anxiety, motion sickness   Gabapentin Itching    Itching if she takes more than 300mg    Latex      Latex band-aids cause redness and tears your skin   Nitrofurantoin     Causing itching all over, tongue swelling, chest heaviness, swollen lips, cough, rapid HR, swollen eyelids, red eyes/gums, lips red/white per pt.   Oxcarbazepine Itching and Other (See Comments)    Dizziness, throat hurts   Teriflunomide Diarrhea    Hair loss, joint pains, elevated liver enzymes abugio (joint pains and liver issues)   Trazodone And Nefazodone     Mouth sores, tongue swelling   Tape Rash    Outpatient Encounter Medications as of 06/30/2021  Medication Sig   Ascorbic Acid (VITAMIN C) 1000 MG tablet Take 1,000 mg by mouth daily.   aspirin EC 81 MG tablet Take 81 mg by mouth daily.   AZO-CRANBERRY PO Take 1 capsule by mouth daily at 12 noon.   baclofen (LIORESAL) 10 MG tablet Take 2 tablets (20 mg total) by mouth 3 (three) times daily.   BLACK ELDERBERRY PO Take 300 mg by mouth in the morning and at bedtime.   Cholecalciferol (VITAMIN D) 50 MCG (2000 UT) CAPS Take 2,000 Units by mouth daily.    CINNAMON PO Take 1,200 mg by mouth daily.    diphenhydrAMINE (BENADRYL) 25 MG tablet Take 25 mg by mouth 3 (three) times daily. Takes with Hydrocodone   doxepin (SINEQUAN) 10 MG capsule TAKE 1 TO 2 CAPSULES AT BEDTIME   EPINEPHrine 0.3 mg/0.3 mL IJ SOAJ injection Use as directed for life-threatening allergic reaction.   famotidine (PEPCID) 20 MG tablet TAKE 1 TABLET BY MOUTH TWICE A DAY   Fexofenadine-Pseudoephedrine (ALLEGRA-D PO) Take by mouth in the morning.  Flaxseed, Linseed, (FLAX SEED OIL) 1000 MG CAPS Take 1,200 mg by mouth daily.    gabapentin (NEURONTIN) 300 MG capsule Take 1 capsule (300 mg total) by mouth 3 (three) times daily.   HYDROcodone-acetaminophen (NORCO/VICODIN) 5-325 MG tablet Take 1 tablet by mouth 3 (three) times daily as needed for moderate pain.   ipratropium (ATROVENT) 0.06 % nasal spray Use 2 sprays each nostril bid prn drainage   L-Lysine 1000 MG TABS Take by mouth. As needed    Lactobacillus (PROBIOTIC ACIDOPHILUS PO) Take by mouth daily. Digestive Advantage BC   bacillus coagulans   levocetirizine (XYZAL) 5 MG tablet TAKE 1 TABLET BY MOUTH EVERY DAY IN THE EVENING   LORazepam (ATIVAN) 1 MG tablet Take 1 tablet (1 mg total) by mouth at bedtime.   Lutein-Bilberry (BILBERRY PLUS LUTEIN PO) Take by mouth daily.   Melatonin 10 MG TABS Take 1 tablet by mouth at bedtime.    methylphenidate (CONCERTA) 27 MG PO CR tablet Take 1 tablet (27 mg total) by mouth every morning.   Moringa 500 MG CAPS Take 2 capsules by mouth daily.   Multiple Vitamins-Minerals (HAIR/SKIN/NAILS/BIOTIN) TABS Take by mouth daily.    Nutritional Supplements (NUTRITIONAL SUPPLEMENT PO) Take 2.1 g by mouth in the morning and at bedtime. LION'S MANE   nystatin-triamcinolone ointment (MYCOLOG) Apply 1 application topically 2 (two) times daily.   rizatriptan (MAXALT-MLT) 10 MG disintegrating tablet Take 1 tablet (10 mg total) by mouth as needed for migraine. May repeat in 2 hours if needed.  Max of 2 tablets in 24 hours, and 10 tablets per month   tiZANidine (ZANAFLEX) 2 MG tablet TAKE UP TO 4 PILLS A DAY   Vitamin D, Ergocalciferol, (DRISDOL) 1.25 MG (50000 UNIT) CAPS capsule ONE CAPSULE TWICE WEEKLY ON TUESDAYS/FRIDAYS   vitamin E 1000 UNIT capsule vitamin E 670 mg (1,000 unit) capsule   1 capsule every day by oral route.   zolpidem (AMBIEN) 10 MG tablet Take 10 mg by mouth at bedtime as needed. For sleep   No facility-administered encounter medications on file as of 06/30/2021.    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   Secondary open-angle glaucoma of right eye, 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   Prediabetes 01/01/2020   Vitamin D deficiency 01/01/2020   Vitamin B 12 deficiency 01/01/2020   At risk for diabetes mellitus 01/01/2020   Class 2 severe obesity due to excess calories with serious comorbidity and body mass index (BMI) of 35.0 to 35.9 in adult ([place]) 01/01/2020   Anosmia 11/15/2019   Disturbance of smell and taste 11/15/2019   Epiretinal membrane, right 07/03/2019   Depression 03/06/2019   Diverticulosis 06/14/2018   Hepatic steatosis 06/14/2018   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   Decreased pedal pulses 06/30/2016   Pain in both lower extremities 06/30/2016   Facial pain, atypical 06/08/2016   Fibroids 02/12/2016   Chest pain 07/07/2015   Dizziness and giddiness 07/07/2015   Leiomyoma of uterus 07/07/2015   Lower abdominal pain 07/07/2015   Hip pain  03/03/2015   Optic neuritis 06/27/2014   Multiple sclerosis ([place]) 06/26/2014   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/26/2014   Other fatigue 06/26/2014   Dysesthesia 06/26/2014   Depression with anxiety 06/26/2014   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   Insomnia 06/26/2014   Low serum vitamin D 06/26/2014    Conditions to be addressed/monitored:  MS, Recurrent Major Depression, Class 1 Obesity, Prediabetes  Care Plan : Social Work [facility] Care Plan  Updates made by Daneen Schick since 06/30/2021 12:00 AM     Problem: Quality of Life (General Plan of Care)      Long-Range Goal: Quality of Life Maintained   Start Date: 10/15/2020  Recent Progress: On track  Priority: Medium  Note:   Current Barriers:  Chronic disease management support and education needs related to  MS, Depression, and Class 1 Obesity   Recent loss of spouse  Social Worker Clinical Goal(s):  patient will work with SW to identify and  address any acute and/or chronic care coordination needs related to the self health management of  MS, Depression, and Class 1 Obesity   Patient will participate in grief share with local church to assist with building a support network following the loss of her spouse  SW Interventions:  Inter-disciplinary care team collaboration (see longitudinal plan of care) Collaboration with [name], MD regarding development and update of comprehensive plan of care as evidenced by provider attestation and co-signature Telephonic visit completed with the patient to assess for care coordination needs Discussed the patient has yet to receive determination from social security regarding spousal disability income ; patient reports she last checked the portal on 2.14.23 and documentation stated a determination would be made in 2-4 weeks Assessed for current financial insecurity with worsening economy - patient is making changes in order to conserve funds by shopping store brands at the grocery store and switching cellular plan to a more affordable option Discussed the patient often must choose between which bills to pay at specific times but is managing currently Patient reports she is currently taking swim lessons at [facility] once weekly and is enjoying this class Reviewed patients health plan does offer a program similar to silver sneakers which she may access once her swim lessons are completed for more water exercise at a local [employer] Scheduled follow up call over the next month  Patient Goals/Self-Care Activities patient will:  -Continue to engage with Social Security re: disability -Work with PharmD to obtain an Epi Pen -Attend all upcoming scheduled appointments -Contact SW as needed prior to next scheduled call  Follow Up Plan:  SW will follow up with the patient on 3.14.23       Follow Up Plan: SW will follow up with patient by phone over the next month      Daneen Schick, BSW,  CDP Social Worker, Certified Dementia Practitioner [place] [facility] [place] [facility] [phone]

## 2021-06-30 NOTE — Patient Instructions (Signed)
Social Worker Visit Information  Goals we discussed today:   Goals Addressed             This Visit's Progress    Quality of Life Maintained       Timeframe:  Long-Range Goal Priority:  Medium Start Date: 6.8.22                                          Next planned outreach: 3.14.23  Patient Goals/Self-Care Activities patient will:  -Continue to engage with Social Security re: disability -Work with PharmD to obtain an Epi Pen -Attend all upcoming scheduled appointments -Contact SW as needed prior to next scheduled call         Patient verbalizes understanding of instructions and care plan provided today and agrees to view in [place]. Active MyChart status confirmed with patient.    Follow Up Plan: SW will follow up with patient by phone over the next month   Daneen Schick, BSW, CDP Social Worker, Certified Dementia Practitioner [place] [facility] [place] [facility] [phone]

## 2021-07-01 ENCOUNTER — Telehealth: Payer: Medicare Other

## 2021-07-01 ENCOUNTER — Ambulatory Visit: Payer: Self-pay

## 2021-07-01 DIAGNOSIS — E6609 Other obesity due to excess calories: Secondary | ICD-10-CM

## 2021-07-01 DIAGNOSIS — E559 Vitamin D deficiency, unspecified: Secondary | ICD-10-CM

## 2021-07-01 DIAGNOSIS — R7303 Prediabetes: Secondary | ICD-10-CM

## 2021-07-01 DIAGNOSIS — G35 Multiple sclerosis: Secondary | ICD-10-CM

## 2021-07-01 DIAGNOSIS — F3342 Major depressive disorder, recurrent, in full remission: Secondary | ICD-10-CM

## 2021-07-01 DIAGNOSIS — Z6833 Body mass index (BMI) 33.0-33.9, adult: Secondary | ICD-10-CM

## 2021-07-02 NOTE — Chronic Care Management (AMB) (Signed)
Chronic Care Management   CCM RN Visit Note  07/01/2021 Name: Grace [name] MRN: [id number] DOB: [birth year]  Subjective: Grace [name] is a 61 y.o. year old female who is a primary care patient of [name], MD. The care management team was consulted for assistance with disease management and care coordination needs.    Engaged with patient by telephone for follow up visit in response to provider referral for case management and/or care coordination services.   Consent to Services:  The patient was given information about Chronic Care Management services, agreed to services, and gave verbal consent prior to initiation of services.  Please see initial visit note for detailed documentation.   Patient agreed to services and verbal consent obtained.   Assessment: Review of patient past medical history, allergies, medications, health status, including review of consultants reports, laboratory and other test data, was performed as part of comprehensive evaluation and provision of chronic care management services.   SDOH (Social Determinants of Health) assessments and interventions performed:  Yes, no acute challenges   CCM Care Plan  Allergies  Allergen Reactions   Diclofenac Anaphylaxis   Lamictal [Lamotrigine] Shortness Of Breath, Itching and Swelling    Tongue swells   Mobic [Meloxicam] Anaphylaxis   Ultram [Tramadol Hcl] Anaphylaxis   Nefazodone Itching and Swelling    Other reaction(s): Arthralgia (Joint Pain); Throat swelling, mouth sores   Betaseron [Interferon Beta-1b] Other (See Comments)    Caused avascular necrosis   Codeine Itching   Copaxone [Glatiramer Acetate] Other (See Comments)    Altered mental status: mental changes, anxiety, motion sickness   Gabapentin Itching    Itching if she takes more than 300mg    Latex     Latex band-aids cause redness and tears your skin   Nitrofurantoin     Causing itching all over, tongue swelling, chest  heaviness, swollen lips, cough, rapid HR, swollen eyelids, red eyes/gums, lips red/white per pt.   Oxcarbazepine Itching and Other (See Comments)    Dizziness, throat hurts   Teriflunomide Diarrhea    Hair loss, joint pains, elevated liver enzymes abugio (joint pains and liver issues)   Trazodone And Nefazodone     Mouth sores, tongue swelling   Tape Rash    Outpatient Encounter Medications as of 07/01/2021  Medication Sig   Ascorbic Acid (VITAMIN C) 1000 MG tablet Take 1,000 mg by mouth daily.   aspirin EC 81 MG tablet Take 81 mg by mouth daily.   AZO-CRANBERRY PO Take 1 capsule by mouth daily at 12 noon.   baclofen (LIORESAL) 10 MG tablet Take 2 tablets (20 mg total) by mouth 3 (three) times daily.   BLACK ELDERBERRY PO Take 300 mg by mouth in the morning and at bedtime.   Cholecalciferol (VITAMIN D) 50 MCG (2000 UT) CAPS Take 2,000 Units by mouth daily.    CINNAMON PO Take 1,200 mg by mouth daily.    diphenhydrAMINE (BENADRYL) 25 MG tablet Take 25 mg by mouth 3 (three) times daily. Takes with Hydrocodone   doxepin (SINEQUAN) 10 MG capsule TAKE 1 TO 2 CAPSULES AT BEDTIME   EPINEPHrine 0.3 mg/0.3 mL IJ SOAJ injection Use as directed for life-threatening allergic reaction.   famotidine (PEPCID) 20 MG tablet TAKE 1 TABLET BY MOUTH TWICE A DAY   Fexofenadine-Pseudoephedrine (ALLEGRA-D PO) Take by mouth in the morning.   Flaxseed, Linseed, (FLAX SEED OIL) 1000 MG CAPS Take 1,200 mg by mouth daily.    gabapentin (NEURONTIN) 300 MG capsule Take  1 capsule (300 mg total) by mouth 3 (three) times daily.   HYDROcodone-acetaminophen (NORCO/VICODIN) 5-325 MG tablet Take 1 tablet by mouth 3 (three) times daily as needed for moderate pain.   ipratropium (ATROVENT) 0.06 % nasal spray Use 2 sprays each nostril bid prn drainage   L-Lysine 1000 MG TABS Take by mouth. As needed   Lactobacillus (PROBIOTIC ACIDOPHILUS PO) Take by mouth daily. Digestive Advantage BC   bacillus coagulans   levocetirizine  (XYZAL) 5 MG tablet TAKE 1 TABLET BY MOUTH EVERY DAY IN THE EVENING   LORazepam (ATIVAN) 1 MG tablet Take 1 tablet (1 mg total) by mouth at bedtime.   Lutein-Bilberry (BILBERRY PLUS LUTEIN PO) Take by mouth daily.   Melatonin 10 MG TABS Take 1 tablet by mouth at bedtime.    methylphenidate (CONCERTA) 27 MG PO CR tablet Take 1 tablet (27 mg total) by mouth every morning.   Moringa 500 MG CAPS Take 2 capsules by mouth daily.   Multiple Vitamins-Minerals (HAIR/SKIN/NAILS/BIOTIN) TABS Take by mouth daily.    Nutritional Supplements (NUTRITIONAL SUPPLEMENT PO) Take 2.1 g by mouth in the morning and at bedtime. LION'S MANE   nystatin-triamcinolone ointment (MYCOLOG) Apply 1 application topically 2 (two) times daily.   rizatriptan (MAXALT-MLT) 10 MG disintegrating tablet Take 1 tablet (10 mg total) by mouth as needed for migraine. May repeat in 2 hours if needed.  Max of 2 tablets in 24 hours, and 10 tablets per month   tiZANidine (ZANAFLEX) 2 MG tablet TAKE UP TO 4 PILLS A DAY   Vitamin D, Ergocalciferol, (DRISDOL) 1.25 MG (50000 UNIT) CAPS capsule ONE CAPSULE TWICE WEEKLY ON TUESDAYS/FRIDAYS   vitamin E 1000 UNIT capsule vitamin E 670 mg (1,000 unit) capsule   1 capsule every day by oral route.   zolpidem (AMBIEN) 10 MG tablet Take 10 mg by mouth at bedtime as needed. For sleep   No facility-administered encounter medications on file as of 07/01/2021.    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   Secondary open-angle glaucoma of right eye, 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   Prediabetes 01/01/2020   Vitamin D deficiency 01/01/2020   Vitamin B 12 deficiency 01/01/2020   At risk for diabetes mellitus 01/01/2020   Class 2 severe obesity due to excess calories with serious comorbidity and body mass index (BMI) of 35.0 to 35.9 in adult ([place]) 01/01/2020   Anosmia 11/15/2019   Disturbance of smell and taste 11/15/2019   Epiretinal membrane, right 07/03/2019   Depression 03/06/2019   Diverticulosis 06/14/2018   Hepatic steatosis 06/14/2018   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   Decreased pedal pulses 06/30/2016   Pain in both lower extremities 06/30/2016   Facial pain, atypical 06/08/2016   Fibroids 02/12/2016   Chest pain 07/07/2015   Dizziness and giddiness 07/07/2015   Leiomyoma of uterus 07/07/2015   Lower abdominal pain 07/07/2015   Hip pain 03/03/2015   Optic neuritis 06/27/2014   Multiple sclerosis ([place]) 06/26/2014   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/26/2014   Other fatigue 06/26/2014  Dysesthesia 06/26/2014   Depression with anxiety 06/26/2014   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   Insomnia 06/26/2014   Low serum vitamin D 06/26/2014    Conditions to be addressed/monitored: MS, Vitamin D Deficiency, Recurrent major depression, Class 1 Obesity due to excess calories, Prediabetes  Care Plan : RN Care Manager Plan of Care  Updates made by Lynne Logan, RN since 07/01/2021 12:00 AM     Problem: No plan of Care established for management of chronic disease states (MS, Vitamin D Deficiency, Recurrent major depression, Class 1 Obesity due to excess calories, Prediabetes)   Priority: High     Long-Range Goal: Establishment of plan of care for management of chronic disease states (MS, Vitamin D Deficiency, Recurrent major depression, Class 1 Obesity due to excess calories, Prediabetes)   Start Date: 04/30/2021  Expected End Date: 04/30/2022  Recent Progress: On  track  Priority: High  Note:   Current Barriers:  Knowledge Deficits related to plan of care for management of MS, Vitamin D Deficiency, Recurrent major depression, Class 1 Obesity due to excess calories, Prediabetes  Chronic Disease Management support and education needs related to MS, Vitamin D Deficiency, Recurrent major depression, Class 1 Obesity due to excess calories, Prediabetes   RNCM Clinical Goal(s):  Patient will verbalize basic understanding of  MS, Vitamin D Deficiency, Recurrent major depression, Class 1 Obesity due to excess calories, Prediabetes disease process and self health management plan as evidenced by patient will report no disease exacerbations related to her chronic disease states as listed above  take all medications exactly as prescribed and will call provider for medication related questions as evidenced by patient will report having no missed doses of her prescribed medications demonstrate Ongoing health management independence as evidenced by patient will report 100% adherence to her prescribed treatment plan  continue to work with RN Care Manager to address care management and care coordination needs related to  MS, Vitamin D Deficiency, Recurrent major depression, Class 1 Obesity due to excess calories, Prediabetes as evidenced by adherence to CM Team Scheduled appointments demonstrate ongoing self health care management ability   as evidenced by    through collaboration with RN Care manager, provider, and care team.   Interventions: 1:1 collaboration with primary care provider regarding development and update of comprehensive plan of care as evidenced by provider attestation and co-signature Inter-disciplinary care team collaboration (see longitudinal plan of care) Evaluation of current treatment plan related to  self management and patient's adherence to plan as established by provider   Class 1 Obesity:  (Status:  Goal on track:  Yes.)  Long Term  Goal Evaluation of current treatment plan related to  Class 1 Obesity ,  self-management and patient's adherence to plan as established by provider Determined patient has been unmotivated to exercise since the loss of her spouse Validated patient's feelings and provided active listening Educated on the importance of implementing a routine exercise regimen to help build muscle mass, improve stamina, and endurance  Encouraged patient to chose the best of the day for which she feels energetic to perform her exercises Encouraged patient to consider joining a gym or senior center in order to socialize and engage with others for exercise Discussed alternatives to leaving her home for exercise including learning how to perform chair exercises Mailed printed educational material related to How to Perform Chair Exercises  Discussed plans with patient for ongoing care management follow up and provided patient with direct contact information for care management team  Body mass index is 35.78 kg/m.     Wt Readings from Last 3 Encounters:  05/20/21 223 lb (101.2 kg)  04/28/21 219 lb 9.6 oz (99.6 kg)  04/16/21 220 lb (99.8 kg)     Glaucoma:  (Status:  Condition stable.  Not addressed this visit.)  Long Term Goal Evaluation of current treatment plan related to  Glaucoma , self-management and patient's adherence to plan as established by provider Reviewed visit note from 12/08/20 per Dr. Edilia Bo Ophthalmology;  ASSESSMENT 1. Secondary open-angle glaucoma of right eye, indeterminate stage Patient is taking and tolerating medications as prescribed. IOP good OU. Exam stable.  Has not been taking tim since last visit here. IOP stable - will stop it.  Recheck in 3 months - get VF and OCT RNFL PLAN:  Stop Timolol x1 OD Atropine x1 OD prn  Recommend ATs prn  Reviewed proper drop instillation technique Return in about 3 months (around 03/10/2021) Ophthalmic Meds Ordered this visit:  There were no meds ordered  this visit.  Discussed plans with patient for ongoing care management follow up and provided patient with direct contact information for care management team  Multiple Sclerosis:  (Status:  Condition stable.  Not addressed this visit.)  Long Term Goal Evaluation of current treatment plan related to  Multiple Sclerosis ,  self-management and patient's adherence to plan as established by provider. Determined patient continues to follow Dr. Felecia Shelling for management of MS, reviewed and discussed upcoming brain MRI per Dr. Felecia Shelling Instructed patient to discuss her concerns regarding clotting factors with Dr. Felecia Shelling at next visit due to patient has noticed she does not have the usual bleeding from small trama or cuts as expected despite being on low dose ASA Educated patient on s/s of UTI and when to seek medical attention if symptoms occur due to patient reporting urinary urgency, she denies other symptoms Determined Dr. Baird Cancer will not send urine culture at this time due to UA is negative  Discussed plans with patient for ongoing care management follow up and provided patient with direct contact information for care management team     Vitamin D deficiency:  (Status:  Condition stable.  Not addressed this visit.)  Long Term Goal Evaluation of current treatment plan related to  Vitamin D deficiency , self-management and patient's adherence to plan as established by provider. Review of patient status, including review of consultant's reports, relevant laboratory and other test results, and medications completed Collaborated with Dr. Baird Cancer regarding patient's current dosing of Vitamin D and determined patient is taking Vitamin D 3, 50,000 units twice weekly and 2000 units OTC daily Patient encouraged to follow this regimen unless otherwise directed by Dr. Baird Cancer or other health care provider  Discussed plans with patient for ongoing care management follow up and provided patient with direct contact  information for care management team Component Ref Range & Units 2 d ago  (04/28/21) 1 yr ago  (04/16/20) 1 yr ago  (10/10/19) 2 yr ago  (01/03/19) 2 yr ago  (06/01/18)  Vit D, 25-Hydroxy 30.0 - 100.0 ng/mL 34.8  42.5 CM  35.5 CM  32.1 CM  38.4 CM    Depression/Grief:  (Status:  Goal on track:  Yes.)  Long Term Goal Evaluation of current treatment plan related to Depression, self-management and patient's adherence to plan as established by provider Determined patient feels her depression is under good control at this time although she continues to experience some bad days Discussed patient plans to resume telephonic  counseling with the Hospice grief counselor monthly and may ask about face to face visits more frequently than monthly if available  Encouraged patient to stay in touch with her adult children who live near by and to consider planning family dinners to help her stay engaged with her family to help lift her spirits and provide the support she needs while grieving over her spouse  Encouraged patient to keep PCP well informed of new or worsening symptoms of depression  Discussed plans with patient for ongoing care management follow up and provided patient with direct contact information for care management team   Palpitations:  (Status:  New goal.)  Short Term Goal Evaluation of current treatment plan related to  Palpitations ,  self-management and patient's adherence to plan as established by provider Discussed recent PCP evaluation of palpitations Educated patient on potential causes and treatment for palpitations Review of patient status, including review of consultant's reports, relevant laboratory and other test results, and medications completed Discussed past establishment with Cardiologist, Dr. Skeet Latch, discussed patient will contact this provider to schedule a follow up and or inquire about having a new referral if needed, patient will discuss further with PCP at next  scheduled follow up Reviewed scheduled/upcoming provider appointments including: next PCP follow up scheduled for 07/20/21 @2 :40 PM Discussed plans with patient for ongoing care management follow up and provided patient with direct contact information for care management team   Patient Goals/Self-Care Activities: Take all medications as prescribed Attend all scheduled provider appointments Call pharmacy for medication refills 3-7 days in advance of running out of medications Perform all self care activities independently  Perform IADL's (shopping, preparing meals, housekeeping, managing finances) independently Call provider office for new concerns or questions  Contact Hospice to resume grief counseling Contact Dr. Oval Linsey to schedule a follow up appointment and or obtain a new referral from PCP Implement a daily exercise routine   Follow Up Plan:  Telephone follow up appointment with care management team member scheduled for:  08/13/21     Barb Merino, RN, BSN, CCM Care Management Coordinator [facility]  Direct Phone: [phone]

## 2021-07-02 NOTE — Patient Instructions (Signed)
Visit Information  Thank you for taking time to visit with me today. Please don't hesitate to contact me if I can be of assistance to you before our next scheduled telephone appointment.  Following are the goals we discussed today:  (Copy and paste patient goals from clinical care plan here)  Our next appointment is by telephone on 08/13/21 at 11:15 AM  Please call the care guide team at [phone] if you need to cancel or reschedule your appointment.   If you are experiencing a Mental Health or [place] or need someone to talk to, please call 1-800-273-TALK (toll free, 24 hour hotline)   Patient verbalizes understanding of instructions and care plan provided today and agrees to view in [place]. Active MyChart status confirmed with patient.    Barb Merino, RN, BSN, CCM Care Management Coordinator [place] [facility]  Direct Phone: [phone]

## 2021-07-06 ENCOUNTER — Other Ambulatory Visit: Payer: Self-pay | Admitting: Neurology

## 2021-07-07 DIAGNOSIS — F3342 Major depressive disorder, recurrent, in full remission: Secondary | ICD-10-CM

## 2021-07-07 MED ORDER — METHYLPHENIDATE HCL ER (OSM) 27 MG PO TBCR: 27.0000 mg | EXTENDED_RELEASE_TABLET | ORAL | 0 refills | Status: DC

## 2021-07-07 MED ORDER — HYDROCODONE-ACETAMINOPHEN 5-325 MG PO TABS: 1.0000 | ORAL_TABLET | Freq: Three times a day (TID) | ORAL | 0 refills | Status: DC | PRN

## 2021-07-07 NOTE — Telephone Encounter (Signed)
Last OV was on 04/16/21.  Next OV is scheduled for 08/12/21 .  Last RX for Hydrocone-acet. was written on 05/27/21 for 90 tabs.  Last RX for methylphenidate was written on 05/27/21 for 30 tabs.  [place] [facility] Database has been reviewed.

## 2021-07-09 ENCOUNTER — Encounter: Payer: Self-pay | Admitting: Neurology

## 2021-07-13 ENCOUNTER — Other Ambulatory Visit: Payer: Self-pay | Admitting: Neurology

## 2021-07-13 ENCOUNTER — Other Ambulatory Visit: Payer: Self-pay | Admitting: Internal Medicine

## 2021-07-13 ENCOUNTER — Other Ambulatory Visit: Payer: Self-pay | Admitting: *Deleted

## 2021-07-13 MED ORDER — HYDROCODONE-ACETAMINOPHEN 5-325 MG PO TABS: 1.0000 | ORAL_TABLET | Freq: Three times a day (TID) | ORAL | 0 refills | Status: DC | PRN

## 2021-07-13 MED ORDER — METHYLPHENIDATE HCL ER (OSM) 27 MG PO TBCR: 27.0000 mg | EXTENDED_RELEASE_TABLET | ORAL | 0 refills | Status: DC

## 2021-07-15 ENCOUNTER — Other Ambulatory Visit: Payer: Self-pay | Admitting: Neurology

## 2021-07-16 ENCOUNTER — Other Ambulatory Visit: Payer: Self-pay | Admitting: Internal Medicine

## 2021-07-16 DIAGNOSIS — Z1231 Encounter for screening mammogram for malignant neoplasm of breast: Secondary | ICD-10-CM

## 2021-07-20 ENCOUNTER — Ambulatory Visit (INDEPENDENT_AMBULATORY_CARE_PROVIDER_SITE_OTHER): Payer: Medicare Other | Admitting: Internal Medicine

## 2021-07-20 ENCOUNTER — Other Ambulatory Visit: Payer: Self-pay

## 2021-07-20 ENCOUNTER — Encounter: Payer: Self-pay | Admitting: Internal Medicine

## 2021-07-20 VITALS — BP 118/80 | HR 94 | Temp 97.9°F | Ht 66.2 in | Wt 221.8 lb

## 2021-07-20 DIAGNOSIS — H9201 Otalgia, right ear: Secondary | ICD-10-CM

## 2021-07-20 DIAGNOSIS — R197 Diarrhea, unspecified: Secondary | ICD-10-CM | POA: Diagnosis not present

## 2021-07-20 DIAGNOSIS — R002 Palpitations: Secondary | ICD-10-CM

## 2021-07-20 DIAGNOSIS — F3342 Major depressive disorder, recurrent, in full remission: Secondary | ICD-10-CM | POA: Insufficient documentation

## 2021-07-20 DIAGNOSIS — Z6835 Body mass index (BMI) 35.0-35.9, adult: Secondary | ICD-10-CM

## 2021-07-20 DIAGNOSIS — G35 Multiple sclerosis: Secondary | ICD-10-CM

## 2021-07-20 DIAGNOSIS — Z2821 Immunization not carried out because of patient refusal: Secondary | ICD-10-CM

## 2021-07-20 DIAGNOSIS — I5189 Other ill-defined heart diseases: Secondary | ICD-10-CM

## 2021-07-20 NOTE — Progress Notes (Unsigned)
[name],acting as a Education administrator for [name], MD.,have documented all relevant documentation on the behalf of [name], MD,as directed by  [name], MD while in the presence of [name], MD.  This visit occurred during the SARS-CoV-2 public health emergency.  Safety protocols were in place, including screening questions prior to the visit, additional usage of staff PPE, and extensive cleaning of exam room while observing appropriate contact time as indicated for disinfecting solutions.  Subjective:     Patient ID: Grace [name] , female    DOB: [birth year] , 61 y.o.   MRN: [id number]   Chief Complaint  Patient presents with   Palpitations    HPI  She presents today for a f/u on her heart palpitations.   Palpitations  This is a new problem. The current episode started 1 to 4 weeks ago. The problem occurs constantly (worse when up moving around). Exacerbated by: movement. Associated symptoms include dizziness (when stands up). Pertinent negatives include no nausea. She has tried bed rest for the symptoms. The treatment provided mild relief.    Past Medical History:  Diagnosis Date   Allergy    Anxiety    Avascular necrosis (HCC)    Back pain    Cancer (HCC)    Chest pain    Chewing difficulty    Chronic fatigue syndrome    Colon polyp    pre-cancerous   Complication of anesthesia    patient states she requires more anesthesia   Depression    Disorder of soft tissue    Diverticulosis    Dizziness    Edema, lower extremity    Fatty liver    Fibromyalgia    GERD (gastroesophageal reflux disease)    H/O breast biopsy    pre- cancerous cells   Hand fracture, left    9/87   Heart palpitations    Heart valve disease    History of Holter monitoring    Hyperlipidemia    Insomnia    Joint pain    Lactose intolerance    Lhermitte's syndrome    Migraines    Multiple food allergies    Multiple sclerosis (HCC)    dx in 11/1996    Multiple sclerosis (HCC)    Neuromuscular disorder (HCC)    Neuropathy    Numbness and tingling in hands    Obesity    Optic neuritis    OSA (obstructive sleep apnea)    no longer have OSA since stopped taking a MS medication   Osteoarthritis    Otitis media    Palpitations    Prediabetes    Raynaud's phenomenon    Shortness of breath    Sleep apnea    Due to medication that she no longer takes.   Swallowing difficulty    Vaginitis and vulvovaginitis    Vitamin B12 deficiency    Vitamin D deficiency      Family History  Problem Relation Age of Onset   Pancreatic cancer Mother    Stroke Mother    Hypertension Mother    Hyperlipidemia Mother    Heart disease Mother    Cancer Mother    Depression Mother    Anxiety disorder Mother    Alcoholism Mother    Testicular cancer Brother    Colon cancer Maternal Uncle    Pancreatic cancer Paternal Uncle    Prostate cancer Maternal Grandfather    Colon polyps Father    Diabetes Father  Hypertension Father    Hyperlipidemia Father    Kidney disease Father    Sleep apnea Father    Obesity Father      Current Outpatient Medications:    Ascorbic Acid (VITAMIN C) 1000 MG tablet, Take 1,000 mg by mouth daily., Disp: , Rfl:    aspirin EC 81 MG tablet, Take 81 mg by mouth daily., Disp: , Rfl:    AZO-CRANBERRY PO, Take 1 capsule by mouth daily at 12 noon., Disp: , Rfl:    baclofen (LIORESAL) 10 MG tablet, TAKE 2 TABLETS BY MOUTH 3 TIMES DAILY., Disp: 540 tablet, Rfl: 3   BLACK ELDERBERRY PO, Take 300 mg by mouth in the morning and at bedtime., Disp: , Rfl:    Cholecalciferol (VITAMIN D) 50 MCG (2000 UT) CAPS, Take 2,000 Units by mouth daily. , Disp: , Rfl:    CINNAMON PO, Take 1,200 mg by mouth daily. , Disp: , Rfl:    diphenhydrAMINE (BENADRYL) 25 MG tablet, Take 25 mg by mouth 3 (three) times daily. Takes with Hydrocodone, Disp: , Rfl:    doxepin (SINEQUAN) 10 MG capsule, Take 1-2 capsules (10-20 mg total) by mouth at bedtime.,  Disp: 180 capsule, Rfl: 1   EPINEPHrine 0.3 mg/0.3 mL IJ SOAJ injection, Use as directed for life-threatening allergic reaction., Disp: 1 each, Rfl: 3   famotidine (PEPCID) 20 MG tablet, TAKE 1 TABLET BY MOUTH TWICE A DAY, Disp: 180 tablet, Rfl: 1   Fexofenadine-Pseudoephedrine (ALLEGRA-D PO), Take by mouth in the morning., Disp: , Rfl:    Flaxseed, Linseed, (FLAX SEED OIL) 1000 MG CAPS, Take 1,200 mg by mouth daily. , Disp: , Rfl:    gabapentin (NEURONTIN) 300 MG capsule, Take 1 capsule (300 mg total) by mouth 3 (three) times daily., Disp: 270 capsule, Rfl: 1   HYDROcodone-acetaminophen (NORCO/VICODIN) 5-325 MG tablet, Take 1 tablet by mouth 3 (three) times daily as needed for moderate pain., Disp: 270 tablet, Rfl: 0   ipratropium (ATROVENT) 0.06 % nasal spray, Use 2 sprays each nostril bid prn drainage, Disp: 30 mL, Rfl: 5   L-Lysine 1000 MG TABS, Take by mouth. As needed, Disp: , Rfl:    Lactobacillus (PROBIOTIC ACIDOPHILUS PO), Take by mouth daily. Digestive Advantage BC   bacillus coagulans, Disp: , Rfl:    levocetirizine (XYZAL) 5 MG tablet, TAKE 1 TABLET BY MOUTH EVERY DAY IN THE EVENING, Disp: 90 tablet, Rfl: 2   LORazepam (ATIVAN) 1 MG tablet, Take 1 tablet (1 mg total) by mouth at bedtime., Disp: 30 tablet, Rfl: 5   Lutein-Bilberry (BILBERRY PLUS LUTEIN PO), Take by mouth daily., Disp: , Rfl:    Melatonin 10 MG TABS, Take 1 tablet by mouth at bedtime. , Disp: , Rfl:    methylphenidate (CONCERTA) 27 MG PO CR tablet, Take 1 tablet (27 mg total) by mouth every morning., Disp: 90 tablet, Rfl: 0   Moringa 500 MG CAPS, Take 2 capsules by mouth daily., Disp: , Rfl:    Multiple Vitamins-Minerals (HAIR/SKIN/NAILS/BIOTIN) TABS, Take by mouth daily. , Disp: , Rfl:    Nutritional Supplements (NUTRITIONAL SUPPLEMENT PO), Take 2.1 g by mouth in the morning and at bedtime. LION'S MANE, Disp: , Rfl:    nystatin-triamcinolone ointment (MYCOLOG), Apply 1 application topically 2 (two) times daily., Disp: 30  g, Rfl: 2   rizatriptan (MAXALT-MLT) 10 MG disintegrating tablet, Take 1 tablet (10 mg total) by mouth as needed for migraine. May repeat in 2 hours if needed.  Max of 2 tablets in  24 hours, and 10 tablets per month, Disp: 10 tablet, Rfl: 11   tiZANidine (ZANAFLEX) 2 MG tablet, TAKE UP TO 4 PILLS A DAY, Disp: 360 tablet, Rfl: 1   Vitamin D, Ergocalciferol, (DRISDOL) 1.25 MG (50000 UNIT) CAPS capsule, ONE CAPSULE TWICE WEEKLY ON TUESDAYS/FRIDAYS, Disp: 24 capsule, Rfl: 2   vitamin E 1000 UNIT capsule, vitamin E 670 mg (1,000 unit) capsule   1 capsule every day by oral route., Disp: , Rfl:    zolpidem (AMBIEN) 10 MG tablet, Take 10 mg by mouth at bedtime as needed. For sleep, Disp: , Rfl:    Allergies  Allergen Reactions   Diclofenac Anaphylaxis   Lamictal [Lamotrigine] Shortness Of Breath, Itching and Swelling    Tongue swells   Mobic [Meloxicam] Anaphylaxis   Ultram [Tramadol Hcl] Anaphylaxis   Nefazodone Itching and Swelling    Other reaction(s): Arthralgia (Joint Pain); Throat swelling, mouth sores   Betaseron [Interferon Beta-1b] Other (See Comments)    Caused avascular necrosis   Codeine Itching   Copaxone [Glatiramer Acetate] Other (See Comments)    Altered mental status: mental changes, anxiety, motion sickness   Gabapentin Itching    Itching if she takes more than 39m   Latex     Latex band-aids cause redness and tears your skin   Nitrofurantoin     Causing itching all over, tongue swelling, chest heaviness, swollen lips, cough, rapid HR, swollen eyelids, red eyes/gums, lips red/white per pt.   Oxcarbazepine Itching and Other (See Comments)    Dizziness, throat hurts   Teriflunomide Diarrhea    Hair loss, joint pains, elevated liver enzymes abugio (joint pains and liver issues)   Trazodone And Nefazodone     Mouth sores, tongue swelling   Tape Rash     Review of Systems  Constitutional: Negative.   HENT:  Positive for ear pain.        She c/o right ear pain that  started on Saturday. States she has started taking swimming lessons and she went swimming on Friday. Denies fever/chills. Pain is described as a dull pain. She takes hydrocodone prn already for MS pains, she states this did not help with the pain.   Cardiovascular:  Positive for palpitations.  Gastrointestinal:  Positive for diarrhea. Negative for nausea.       She c/o diarrhea. Sx started two weeks ago, after eating at "[employer]" [facility] She does admit to eating more than usual. There is associated abdominal discomfort, cramping and some nausea. Denies vomiting. She has not tried any otc meds for relief.   Neurological:  Positive for dizziness (when stands up).  Psychiatric/Behavioral: Negative.      Today's Vitals   07/20/21 1437  BP: 118/80  Pulse: 94  Temp: 97.9 F (36.6 C)  Weight: 221 lb 12.8 oz (100.6 kg)  Height: 5' 6.2" (1.681 m)   Body mass index is 35.58 kg/m.  Wt Readings from Last 3 Encounters:  07/20/21 221 lb 12.8 oz (100.6 kg)  05/20/21 223 lb (101.2 kg)  04/28/21 219 lb 9.6 oz (99.6 kg)     Objective:  Physical Exam      Assessment And Plan:     1. Heart palpitations Comments: Persistent. I will refer her to Cardiology for further evaluations.  - Ambulatory referral to Cardiology  2. Grade I diastolic dysfunction Comments: I will refer her to Cardiology for further evaluation. She was congratulated on her lifestyle changes.  - Ambulatory referral to Cardiology  3. Right ear pain Comments: No sign of infection. She was given samples of Norel AD one tab twice daily for the next three days. She will hold Xyzal and Allegra D for the next 3 days.   4. Diarrhea, unspecified type Comments: There is a possibility this is due to food poisoning; however, I do not think so since her sx started 2 weeks ago.  - CMP14+EGFR - CBC no Diff  5. Class 2 severe obesity due to excess calories with serious comorbidity and body mass index (BMI) of 35.0 to 35.9  in adult [facility]) Comments: She is encouraged to strive for BMI<30 to decrease cardiac risk. Advised to aim for at least 150 minutes of exercise per week.   6. COVID-19 vaccination declined   Patient was given opportunity to ask questions. Patient verbalized understanding of the plan and was able to repeat key elements of the plan. All questions were answered to their satisfaction.    I, [name], MD, have reviewed all documentation for this visit. The documentation on 07/20/21 for the exam, diagnosis, procedures, and orders are all accurate and complete.   IF YOU HAVE BEEN REFERRED TO A SPECIALIST, IT MAY TAKE 1-2 WEEKS TO SCHEDULE/PROCESS THE REFERRAL. IF YOU HAVE NOT HEARD FROM US/SPECIALIST IN TWO WEEKS, PLEASE GIVE [place] A CALL AT [phone].   THE PATIENT IS ENCOURAGED TO PRACTICE SOCIAL DISTANCING DUE TO THE COVID-19 PANDEMIC.

## 2021-07-20 NOTE — Patient Instructions (Signed)
Diarrhea, Adult °Diarrhea is when you pass loose and watery poop (stool) often. Diarrhea can make you feel weak and cause you to lose water in your body (get dehydrated). Losing water in your body can cause you to: °Feel tired and thirsty. °Have a dry mouth. °Go pee (urinate) less often. °Diarrhea often lasts 2-3 days. However, it can last longer if it is a sign of something more serious. It is important to treat your diarrhea as told by your doctor. °Follow these instructions at home: °Eating and drinking °  °Follow these instructions as told by your doctor: °Take an ORS (oral rehydration solution). This is a drink that helps you replace fluids and minerals your body lost. It is sold at pharmacies and stores. °Drink plenty of fluids, such as: °Water. °Ice chips. °Diluted fruit juice. °Low-calorie sports drinks. °Milk, if you want. °Avoid drinking fluids that have a lot of sugar or caffeine in them. °Eat bland, easy-to-digest foods in small amounts as you are able. These foods include: °Bananas. °Applesauce. °Rice. °Low-fat (lean) meats. °Toast. °Crackers. °Avoid alcohol. °Avoid spicy or fatty foods. ° °Medicines °Take over-the-counter and prescription medicines only as told by your doctor. °If you were prescribed an antibiotic medicine, take it as told by your doctor. Do not stop using the antibiotic even if you start to feel better. °General instructions ° °Wash your hands often using soap and water. If soap and water are not available, use a hand sanitizer. Others in your home should wash their hands as well. Hands should be washed: °After using the toilet or changing a diaper. °Before preparing, cooking, or serving food. °While caring for a sick person. °While visiting someone in a hospital. °Drink enough fluid to keep your pee (urine) pale yellow. °Rest at home while you get better. °Take a warm bath to help with any burning or pain from having diarrhea. °Watch your condition for any changes. °Keep all  follow-up visits as told by your doctor. This is important. °Contact a doctor if: °You have a fever. °Your diarrhea gets worse. °You have new symptoms. °You cannot keep fluids down. °You feel light-headed or dizzy. °You have a headache. °You have muscle cramps. °Get help right away if: °You have chest pain. °You feel very weak or you pass out (faint). °You have bloody or black poop or poop that looks like tar. °You have very bad pain, cramping, or bloating in your belly (abdomen). °You have trouble breathing or you are breathing very quickly. °Your heart is beating very quickly. °Your skin feels cold and clammy. °You feel confused. °You have signs of losing too much water in your body, such as: °Dark pee, very little pee, or no pee. °Cracked lips. °Dry mouth. °Sunken eyes. °Sleepiness. °Weakness. °Summary °Diarrhea is when you pass loose and watery poop (stool) often. °Diarrhea can make you feel weak and cause you to lose water in your body (get dehydrated). °Take an ORS (oral rehydration solution). This is a drink that is sold at pharmacies and stores. °Eat bland, easy-to-digest foods in small amounts as you are able. °Contact a doctor if your condition gets worse. Get help right away if you have signs that you have lost too much water in your body. °This information is not intended to replace advice given to you by your health care provider. Make sure you discuss any questions you have with your health care provider. °Document Revised: 11/05/2020 Document Reviewed: 11/05/2020 °Elsevier Patient Education © 2022 [employer]. ° °

## 2021-07-21 ENCOUNTER — Telehealth: Payer: Self-pay

## 2021-07-21 ENCOUNTER — Ambulatory Visit (INDEPENDENT_AMBULATORY_CARE_PROVIDER_SITE_OTHER): Payer: Medicare Other

## 2021-07-21 DIAGNOSIS — E6609 Other obesity due to excess calories: Secondary | ICD-10-CM

## 2021-07-21 DIAGNOSIS — R7303 Prediabetes: Secondary | ICD-10-CM

## 2021-07-21 DIAGNOSIS — E559 Vitamin D deficiency, unspecified: Secondary | ICD-10-CM

## 2021-07-21 DIAGNOSIS — G35 Multiple sclerosis: Secondary | ICD-10-CM

## 2021-07-21 DIAGNOSIS — F3342 Major depressive disorder, recurrent, in full remission: Secondary | ICD-10-CM

## 2021-07-21 LAB — CMP14+EGFR
ALT: 35 IU/L — ABNORMAL HIGH (ref 0–32)
AST: 28 IU/L (ref 0–40)
Albumin/Globulin Ratio: 1.8 (ref 1.2–2.2)
Albumin: 4.5 g/dL (ref 3.8–4.9)
Alkaline Phosphatase: 134 IU/L — ABNORMAL HIGH (ref 44–121)
BUN/Creatinine Ratio: 28 — ABNORMAL HIGH (ref 9–23)
BUN: 18 mg/dL (ref 6–24)
Bilirubin Total: 0.3 mg/dL (ref 0.0–1.2)
CO2: 21 mmol/L (ref 20–29)
Calcium: 9.6 mg/dL (ref 8.7–10.2)
Chloride: 105 mmol/L (ref 96–106)
Creatinine, Ser: 0.64 mg/dL (ref 0.57–1.00)
Globulin, Total: 2.5 g/dL (ref 1.5–4.5)
Glucose: 92 mg/dL (ref 70–99)
Potassium: 4.5 mmol/L (ref 3.5–5.2)
Sodium: 142 mmol/L (ref 134–144)
Total Protein: 7 g/dL (ref 6.0–8.5)
eGFR: 102 mL/min/{1.73_m2} (ref >59)

## 2021-07-21 LAB — CBC
Hematocrit: 40.4 % (ref 34.0–46.6)
Hemoglobin: 13.8 g/dL (ref 11.1–15.9)
MCH: 31.4 pg (ref 26.6–33.0)
MCHC: 34.2 g/dL (ref 31.5–35.7)
MCV: 92 fL (ref 79–97)
Platelets: 298 10*3/uL (ref 150–450)
RBC: 4.4 x10E6/uL (ref 3.77–5.28)
RDW: 12.2 % (ref 11.7–15.4)
WBC: 6.3 10*3/uL (ref 3.4–10.8)

## 2021-07-21 NOTE — Telephone Encounter (Signed)
I left the patient a message that her [employer] Statement of Continued Disability has been completed, faxed, and a copy has been kept for her chart.  Her original has been kept for her chart.  ?

## 2021-07-21 NOTE — Patient Instructions (Signed)
Social Worker Visit Information ? ?Goals we discussed today:  ? Goals Addressed   ? ?  ?  ?  ?  ? This Visit's Progress  ?  Quality of Life Maintained   On track  ?  Timeframe:  Long-Range Goal ?Priority:  Medium ?Start Date: 6.8.22                            ?              ?Next planned outreach: 5.15.23 ? ?Patient Goals/Self-Care Activities ?patient will:  ?-Engage with Dr. Baird Cancer to review lab results ?-Contact SW as needed prior to next scheduled call ?  ? ?  ?  ? ?Patient verbalizes understanding of instructions and care plan provided today and agrees to view in [place]. Active MyChart status confirmed with patient.   ? ?Follow Up Plan: SW will follow up with patient by phone over the next two months ? ?Daneen Schick, BSW, CDP ?Social Worker, Certified Dementia Practitioner ?[facility] ?[phone] ? ?   ? ?

## 2021-07-21 NOTE — Chronic Care Management (AMB) (Signed)
?Chronic Care Management  ? ? Social Work Note ? ?07/21/2021 ?Name: Grace [name] MRN: [id number] DOB: 1960-05-27 ? ?Grace [name] is a 61 y.o. year old female who is a primary care patient of Grace Chard, MD. The CCM team was consulted to assist the patient with chronic disease management and/or care coordination needs related to:  MS, Recurrent Major Depression, Class 1 Obesity, Prediabetes, Vitamin D Deficiency .  ? ?Engaged with patient by telephone for follow up visit in response to provider referral for social work chronic care management and care coordination services.  ? ?Consent to Services:  ?The patient was given information about Chronic Care Management services, agreed to services, and gave verbal consent prior to initiation of services.  Please see initial visit note for detailed documentation.  ? ?Patient agreed to services and consent obtained.  ? ?Assessment: Review of patient past medical history, allergies, medications, and health status, including review of relevant consultants reports was performed today as part of a comprehensive evaluation and provision of chronic care management and care coordination services.    ? ?SDOH (Social Determinants of Health) assessments and interventions performed:   ? ?Advanced Directives Status: Not addressed in this encounter. ? ?CCM Care Plan ? ?Allergies  ?Allergen Reactions  ? Diclofenac Anaphylaxis  ? Lamictal [Lamotrigine] Shortness Of Breath, Itching and Swelling  ?  Tongue swells  ? Mobic [Meloxicam] Anaphylaxis  ? Ultram [Tramadol Hcl] Anaphylaxis  ? Nefazodone Itching and Swelling  ?  Other reaction(s): Arthralgia (Joint Pain); Throat swelling, mouth sores  ? Betaseron [Interferon Beta-1b] Other (See Comments)  ?  Caused avascular necrosis  ? Codeine Itching  ? Copaxone [Glatiramer Acetate] Other (See Comments)  ?  Altered mental status: mental changes, anxiety, motion sickness  ? Gabapentin Itching  ?  Itching if she takes more than  '300mg'$   ? Latex   ?  Latex band-aids cause redness and tears your skin  ? Nitrofurantoin   ?  Causing itching all over, tongue swelling, chest heaviness, swollen lips, cough, rapid HR, swollen eyelids, red eyes/gums, lips red/white per pt.  ? Oxcarbazepine Itching and Other (See Comments)  ?  Dizziness, throat hurts  ? Teriflunomide Diarrhea  ?  Hair loss, joint pains, elevated liver enzymes abugio (joint pains and liver issues)  ? Trazodone And Nefazodone   ?  Mouth sores, tongue swelling  ? Tape Rash  ? ? ?Outpatient Encounter Medications as of 07/21/2021  ?Medication Sig  ? Ascorbic Acid (VITAMIN C) 1000 MG tablet Take 1,000 mg by mouth daily.  ? aspirin EC 81 MG tablet Take 81 mg by mouth daily.  ? AZO-CRANBERRY PO Take 1 capsule by mouth daily at 12 noon.  ? baclofen (LIORESAL) 10 MG tablet TAKE 2 TABLETS BY MOUTH 3 TIMES DAILY.  ? BLACK ELDERBERRY PO Take 300 mg by mouth in the morning and at bedtime.  ? Cholecalciferol (VITAMIN D) 50 MCG (2000 UT) CAPS Take 2,000 Units by mouth daily.   ? CINNAMON PO Take 1,200 mg by mouth daily.   ? diphenhydrAMINE (BENADRYL) 25 MG tablet Take 25 mg by mouth 3 (three) times daily. Takes with Hydrocodone  ? doxepin (SINEQUAN) 10 MG capsule Take 1-2 capsules (10-20 mg total) by mouth at bedtime.  ? EPINEPHrine 0.3 mg/0.3 mL IJ SOAJ injection Use as directed for life-threatening allergic reaction.  ? famotidine (PEPCID) 20 MG tablet TAKE 1 TABLET BY MOUTH TWICE A DAY  ? Fexofenadine-Pseudoephedrine (ALLEGRA-D PO) Take by mouth in the morning.  ?  Flaxseed, Linseed, (FLAX SEED OIL) 1000 MG CAPS Take 1,200 mg by mouth daily.   ? gabapentin (NEURONTIN) 300 MG capsule Take 1 capsule (300 mg total) by mouth 3 (three) times daily.  ? HYDROcodone-acetaminophen (NORCO/VICODIN) 5-325 MG tablet Take 1 tablet by mouth 3 (three) times daily as needed for moderate pain.  ? ipratropium (ATROVENT) 0.06 % nasal spray Use 2 sprays each nostril bid prn drainage  ? L-Lysine 1000 MG TABS Take by  mouth. As needed  ? Lactobacillus (PROBIOTIC ACIDOPHILUS PO) Take by mouth daily. Digestive Advantage ?BC   bacillus coagulans  ? levocetirizine (XYZAL) 5 MG tablet TAKE 1 TABLET BY MOUTH EVERY DAY IN THE EVENING  ? LORazepam (ATIVAN) 1 MG tablet Take 1 tablet (1 mg total) by mouth at bedtime.  ? Lutein-Bilberry (BILBERRY PLUS LUTEIN PO) Take by mouth daily.  ? Melatonin 10 MG TABS Take 1 tablet by mouth at bedtime.   ? methylphenidate (CONCERTA) 27 MG PO CR tablet Take 1 tablet (27 mg total) by mouth every morning.  ? Moringa 500 MG CAPS Take 2 capsules by mouth daily.  ? Multiple Vitamins-Minerals (HAIR/SKIN/NAILS/BIOTIN) TABS Take by mouth daily.   ? Nutritional Supplements (NUTRITIONAL SUPPLEMENT PO) Take 2.1 g by mouth in the morning and at bedtime. LION'S MANE  ? nystatin-triamcinolone ointment (MYCOLOG) Apply 1 application topically 2 (two) times daily.  ? rizatriptan (MAXALT-MLT) 10 MG disintegrating tablet Take 1 tablet (10 mg total) by mouth as needed for migraine. May repeat in 2 hours if needed.  Max of 2 tablets in 24 hours, and 10 tablets per month  ? tiZANidine (ZANAFLEX) 2 MG tablet TAKE UP TO 4 PILLS A DAY  ? Vitamin D, Ergocalciferol, (DRISDOL) 1.25 MG (50000 UNIT) CAPS capsule ONE CAPSULE TWICE WEEKLY ON TUESDAYS/FRIDAYS  ? vitamin E 1000 UNIT capsule vitamin E 670 mg (1,000 unit) capsule ?  1 capsule every day by oral route.  ? zolpidem (AMBIEN) 10 MG tablet Take 10 mg by mouth at bedtime as needed. For sleep  ? ?No facility-administered encounter medications on file as of 07/21/2021.  ? ? ?Patient Active Problem List  ? Diagnosis Date Noted  ? Recurrent major depressive disorder, in full remission ([place]) 07/20/2021  ? Influenza vaccination declined 05/16/2021  ? New onset headache 05/16/2021  ? Urinary urgency 05/16/2021  ? Grief 05/16/2021  ? 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 Chronic prescription opiate use 12/17/2020  ? Numbness 12/17/2020  ? Arthritis  11/26/2020  ? Irritable bowel syndrome 11/26/2020  ? At risk for heart disease 04/15/2020  ? Other hyperlipidemia with hypertriglyceridemia 04/15/2020  ? Cystoid macular edema of right eye 03/25/2020  ? 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 Prediabetes 01/01/2020  ? Vitamin D deficiency 01/01/2020  ? Vitamin B 12 deficiency 01/01/2020  ? At risk for diabetes mellitus 01/01/2020  ? Class 2 severe obesity due to excess calories with serious comorbidity and body mass index (BMI) of 35.0 to 35.9 in adult [facility]) 01/01/2020  ? Anosmia 11/15/2019  ? Disturbance of smell and taste 11/15/2019  ? Epiretinal membrane, right 07/03/2019  ? Depression 03/06/2019  ? Diverticulosis 06/14/2018  ? Hepatic steatosis 06/14/2018  ? 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 Decreased pedal pulses 06/30/2016  ? Pain in both lower extremities 06/30/2016  ? Facial pain, atypical 06/08/2016  ? Fibroids 02/12/2016  ? Chest pain 07/07/2015  ? Dizziness and giddiness 07/07/2015  ? Leiomyoma of uterus  07/07/2015  ? Lower abdominal pain 07/07/2015  ? Hip pain 03/03/2015  ? Optic neuritis 06/27/2014  ? Multiple sclerosis ([place]) 06/26/2014  ? 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/26/2014  ? Other fatigue 06/26/2014  ? Dysesthesia 06/26/2014  ? Depression with anxiety 06/26/2014  ? 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 Insomnia 06/26/2014  ? Low serum vitamin D 06/26/2014  ? ? ?Conditions to be addressed/monitored:  MS, Recurrent Major Depression, Class 1 Obesity, Prediabetes, Vitamin D Deficiency ? ?Care Plan : Social Work [place]  ?Updates made by Daneen Schick since 07/21/2021 12:00 AM  ?  ? ?Problem: Quality of Life (General Plan of Care)   ?  ? ?Long-Range Goal: Quality of Life Maintained   ?Start Date: 10/15/2020  ?This Visit's Progress: On track  ?Recent Progress: On track  ?Priority: Medium  ?Note:   ?Current Barriers:  ?Chronic disease management support and education needs  related to  MS, Depression, and Class 1 Obesity   ?Recent loss of spouse ? ?Social Worker Clinical Goal(s):  ?patient will work with SW to identify and address any acute and/or chronic care coordination needs related

## 2021-07-28 ENCOUNTER — Encounter: Payer: Self-pay | Admitting: Internal Medicine

## 2021-08-07 DIAGNOSIS — F3342 Major depressive disorder, recurrent, in full remission: Secondary | ICD-10-CM

## 2021-08-12 ENCOUNTER — Telehealth (INDEPENDENT_AMBULATORY_CARE_PROVIDER_SITE_OTHER): Payer: Medicare Other | Admitting: Neurology

## 2021-08-12 ENCOUNTER — Telehealth: Payer: Self-pay | Admitting: Family Medicine

## 2021-08-12 ENCOUNTER — Encounter: Payer: Self-pay | Admitting: Neurology

## 2021-08-12 ENCOUNTER — Telehealth (INDEPENDENT_AMBULATORY_CARE_PROVIDER_SITE_OTHER): Payer: Medicare Other | Admitting: Internal Medicine

## 2021-08-12 ENCOUNTER — Encounter: Payer: Self-pay | Admitting: Internal Medicine

## 2021-08-12 ENCOUNTER — Ambulatory Visit: Payer: Medicare Other | Admitting: Neurology

## 2021-08-12 VITALS — Ht 66.2 in | Wt 220.0 lb

## 2021-08-12 DIAGNOSIS — F988 Other specified behavioral and emotional disorders with onset usually occurring in childhood and adolescence: Secondary | ICD-10-CM

## 2021-08-12 DIAGNOSIS — J069 Acute upper respiratory infection, unspecified: Secondary | ICD-10-CM | POA: Diagnosis not present

## 2021-08-12 DIAGNOSIS — G4700 Insomnia, unspecified: Secondary | ICD-10-CM

## 2021-08-12 DIAGNOSIS — R5383 Other fatigue: Secondary | ICD-10-CM

## 2021-08-12 DIAGNOSIS — Z79891 Long term (current) use of opiate analgesic: Secondary | ICD-10-CM

## 2021-08-12 DIAGNOSIS — R051 Acute cough: Secondary | ICD-10-CM

## 2021-08-12 DIAGNOSIS — G35 Multiple sclerosis: Secondary | ICD-10-CM | POA: Diagnosis not present

## 2021-08-12 MED ORDER — PROMETHAZINE-DM 6.25-15 MG/5ML PO SYRP
5.0000 mL | ORAL_SOLUTION | Freq: Four times a day (QID) | ORAL | 0 refills | Status: DC | PRN
Start: 2021-08-12 — End: 2021-11-20

## 2021-08-12 MED ORDER — AMOXICILLIN-POT CLAVULANATE 875-125 MG PO TABS: 1.0000 | ORAL_TABLET | Freq: Two times a day (BID) | ORAL | 0 refills | Status: AC

## 2021-08-12 NOTE — Progress Notes (Signed)
? ?Virtual Visit via Video  ? ?This visit type was conducted due to national recommendations for restrictions regarding the COVID-19 Pandemic (e.g. social distancing) in an effort to limit this patient's exposure and mitigate transmission in our community.  Due to her co-morbid illnesses, this patient is at least at moderate risk for complications without adequate follow up.  This format is felt to be most appropriate for this patient at this time.  All issues noted in this document were discussed and addressed.  A limited physical exam was performed with this format.   ? ?This visit type was conducted due to national recommendations for restrictions regarding the COVID-19 Pandemic (e.g. social distancing) in an effort to limit this patient's exposure and mitigate transmission in our community.  Patients identity confirmed using two different identifiers.  This format is felt to be most appropriate for this patient at this time.  All issues noted in this document were discussed and addressed.  No physical exam was performed (except for noted visual exam findings with Video Visits).   ? ?Date:  08/17/2021  ? ?ID:  Grace [name], DOB 02-14-1961, MRN [id number] ? ?Patient Location:  ?Home ? ?Provider location:   ?Office ? ? ? ?Chief Complaint:  "I think I have sinus infection" ? ?History of Present Illness:   ? ?Grace [name] Coulthard is a 61 y.o. female who presents via video conferencing for a telehealth visit today.   ? ?The patient does have symptoms concerning for COVID-19 infection (fever, chills, cough, or new shortness of breath).  ? ?She presents today for virtual visit. She prefers this method of contact due to COVID-19 pandemic.  She presents today for further evaluation of sinus congestion, sore throat and cough. She is having a dry cough Her sx initially started two weeks ago, improved, and her sx have since returned. She states her temp has been 100.0, not sure if her thermometer reads correctly.  She denies chills and body aches. She is currently taking Delsym cough medicine and Nyquil without relief of her sx.  ?  ?  ? ?Past Medical History:  ?Diagnosis Date  ? Allergy   ? Anxiety   ? Avascular necrosis ([place])   ? Back pain   ? Cancer [facility])   ? Chest pain   ? Chewing difficulty   ? Chronic fatigue syndrome   ? Colon polyp   ? pre-cancerous  ? Complication of anesthesia   ? patient states she requires more anesthesia  ? Depression   ? Disorder of soft tissue   ? Diverticulosis   ? Dizziness   ? Edema, lower extremity   ? Fatty liver   ? Fibromyalgia   ? GERD (gastroesophageal reflux disease)   ? H/O breast biopsy   ? pre- cancerous cells  ? Hand fracture, left   ? 9/87  ? Heart palpitations   ? Heart valve disease   ? History of Holter monitoring   ? Hyperlipidemia   ? Insomnia   ? Joint pain   ? Lactose intolerance   ? Lhermitte's syndrome   ? Migraines   ? Multiple food allergies   ? Multiple sclerosis ([place])   ? dx in 11/1996  ? Multiple sclerosis ([place])   ? Neuromuscular disorder ([place])   ? Neuropathy   ? Numbness and tingling in hands   ? Obesity   ? Optic neuritis   ? OSA (obstructive sleep apnea)   ? no longer have OSA since stopped taking a MS  medication  ? Osteoarthritis   ? Otitis media   ? Palpitations   ? Prediabetes   ? Raynaud's phenomenon   ? Shortness of breath   ? Sleep apnea   ? Due to medication that she no longer takes.  ? Swallowing difficulty   ? Vaginitis and vulvovaginitis   ? Vitamin B12 deficiency   ? Vitamin D deficiency   ? ?Past Surgical History:  ?Procedure Laterality Date  ? ABDOMINAL HYSTERECTOMY Bilateral 02/12/2016  ? Procedure: HYSTERECTOMY ABDOMINAL WITH BILATERAL SALPINGO OOPHERECTOMY;  Surgeon: Dian Queen, MD;  Location: [facility];  Service: Gynecology;  Laterality: Bilateral;  ? BREAST EXCISIONAL BIOPSY    ? core/left breast  ? CATARACT EXTRACTION, BILATERAL  06/2019  ? CHOLECYSTECTOMY  1983  ? COLONOSCOPY    ? 1997/2001  ? DILATION AND CURETTAGE OF UTERUS    ? ENDOMETRIAL  ABLATION  2012  ? LAPAROSCOPIC ABDOMINAL EXPLORATION    ? ovaries and intestines bond together  ? LEFT HEART CATHETERIZATION WITH CORONARY ANGIOGRAM N/A 06/21/2011  ? Procedure: LEFT HEART CATHETERIZATION WITH CORONARY ANGIOGRAM;  Surgeon: Leonie Man, MD;  Location: [facility] CATH LAB;  Service: Cardiovascular;  Laterality: N/A;  ? LUMBAR PUNCTURE    ? 11/01/1996  ? SALPINGOOPHORECTOMY Bilateral 02/12/2016  ? Procedure: BILATERAL SALPINGO OOPHORECTOMY;  Surgeon: Dian Queen, MD;  Location: [facility];  Service: Gynecology;  Laterality: Bilateral;  ? TOTAL HIP ARTHROPLASTY    ? left hip  6/02  /right hip12/04  ?  ? ?Current Meds  ?Medication Sig  ? amoxicillin-clavulanate (AUGMENTIN) 875-125 MG tablet Take 1 tablet by mouth 2 (two) times daily for 10 days.  ? Ascorbic Acid (VITAMIN C) 1000 MG tablet Take 1,000 mg by mouth daily.  ? aspirin EC 81 MG tablet Take 81 mg by mouth daily.  ? AZO-CRANBERRY PO Take 1 capsule by mouth daily at 12 noon.  ? baclofen (LIORESAL) 10 MG tablet TAKE 2 TABLETS BY MOUTH 3 TIMES DAILY.  ? BLACK ELDERBERRY PO Take 300 mg by mouth in the morning and at bedtime.  ? Cholecalciferol (VITAMIN D) 50 MCG (2000 UT) CAPS Take 2,000 Units by mouth daily.   ? CINNAMON PO Take 1,200 mg by mouth daily.   ? diphenhydrAMINE (BENADRYL) 25 MG tablet Take 25 mg by mouth 3 (three) times daily. Takes with Hydrocodone  ? doxepin (SINEQUAN) 10 MG capsule Take 1-2 capsules (10-20 mg total) by mouth at bedtime.  ? famotidine (PEPCID) 20 MG tablet TAKE 1 TABLET BY MOUTH TWICE A DAY  ? Fexofenadine-Pseudoephedrine (ALLEGRA-D PO) Take by mouth in the morning.  ? Flaxseed, Linseed, (FLAX SEED OIL) 1000 MG CAPS Take 1,200 mg by mouth daily.   ? gabapentin (NEURONTIN) 300 MG capsule Take 1 capsule (300 mg total) by mouth 3 (three) times daily.  ? HYDROcodone-acetaminophen (NORCO/VICODIN) 5-325 MG tablet Take 1 tablet by mouth 3 (three) times daily as needed for moderate pain.  ? ipratropium (ATROVENT) 0.06 % nasal spray  Use 2 sprays each nostril bid prn drainage  ? L-Lysine 1000 MG TABS Take by mouth. As needed  ? Lactobacillus (PROBIOTIC ACIDOPHILUS PO) Take by mouth daily. Digestive Advantage ?BC   bacillus coagulans  ? levocetirizine (XYZAL) 5 MG tablet TAKE 1 TABLET BY MOUTH EVERY DAY IN THE EVENING  ? LORazepam (ATIVAN) 1 MG tablet Take 1 tablet (1 mg total) by mouth at bedtime.  ? Lutein-Bilberry (BILBERRY PLUS LUTEIN PO) Take by mouth daily.  ? Melatonin 10 MG TABS Take 1 tablet  by mouth at bedtime.   ? methylphenidate (CONCERTA) 27 MG PO CR tablet Take 1 tablet (27 mg total) by mouth every morning.  ? Moringa 500 MG CAPS Take 2 capsules by mouth daily.  ? Multiple Vitamins-Minerals (HAIR/SKIN/NAILS/BIOTIN) TABS Take by mouth daily.   ? Nutritional Supplements (NUTRITIONAL SUPPLEMENT PO) Take 2.1 g by mouth in the morning and at bedtime. LION'S MANE  ? nystatin-triamcinolone ointment (MYCOLOG) Apply 1 application topically 2 (two) times daily.  ? promethazine-dextromethorphan (PROMETHAZINE-DM) 6.25-15 MG/5ML syrup Take 5 mLs by mouth 4 (four) times daily as needed for cough.  ? rizatriptan (MAXALT-MLT) 10 MG disintegrating tablet Take 1 tablet (10 mg total) by mouth as needed for migraine. May repeat in 2 hours if needed.  Max of 2 tablets in 24 hours, and 10 tablets per month  ? tiZANidine (ZANAFLEX) 2 MG tablet TAKE UP TO 4 PILLS A DAY  ? Vitamin D, Ergocalciferol, (DRISDOL) 1.25 MG (50000 UNIT) CAPS capsule ONE CAPSULE TWICE WEEKLY ON TUESDAYS/FRIDAYS  ? vitamin E 1000 UNIT capsule vitamin E 670 mg (1,000 unit) capsule ?  1 capsule every day by oral route.  ? zolpidem (AMBIEN) 10 MG tablet Take 10 mg by mouth at bedtime as needed. For sleep  ?  ? ?Allergies:   Diclofenac, Lamictal [lamotrigine], Mobic [meloxicam], Ultram [tramadol hcl], Nefazodone, Betaseron [interferon beta-1b], Codeine, Copaxone [glatiramer acetate], Gabapentin, Latex, Nitrofurantoin, Oxcarbazepine, Teriflunomide, Trazodone and nefazodone, and Tape   ? ?Social History  ? ?Tobacco Use  ? Smoking status: Former  ?  Packs/day: 0.25  ?  Years: 8.00  ?  Pack years: 2.00  ?  Types: Cigarettes  ?  Quit date: 07/01/1995  ?  Years since quitting: 26.1  ? Smoke

## 2021-08-12 NOTE — Progress Notes (Addendum)
? ?[facility] ? ?PATIENT: Grace [name] ?DOB: 05/07/1961 ? ?REFERRING DOCTOR OR PCP:  [name] ?SOURCE: patient ? ?_________________________________ ? ? ?HISTORICAL ? ?CHIEF COMPLAINT:  ?Chief Complaint  ?Patient presents with  ? Multiple Sclerosis  ? ? ?HISTORY OF PRESENT ILLNESS:  ?Sandra Brents is a 61 y.o. woman with relapsing remitting multiple sclerosis.    ? ?Virtual Visit via Video Note ?I connected with Maleya Leever Adsit  on 08/12/21 at  4:00 PM EDT by a video enabled telemedicine application and verified that I am speaking with the correct person.  I discussed the limitations of evaluation and management by telemedicine and the availability of in person appointments. The patient expressed understanding and agreed to proceed. ? ?Patient: Home  ?Provider: Office   ? ? ? ?Update 08/12/2021: ?She is off a  DMT for MS.  She is neurologically stable.   ? ?She has had an URI since yesterday, prompting change to video visit.  ? ?She haas no recent fall.  She stumbles some and uses the rails on stairs.   She notes mild weakness- reduced grips but nothing new.  She feels weaker with heat.   She has tingling in her arms and feet.. She has urinary urgency which is worse with cough/sneeze sometimes causing incontinence..    Her gait is mildly off balance but no falls.  She continues to experience some dysesthesias especially in the hands.   ? ?She has pain in the neck, back.  She gets some benefit from gabapentin and hydrocodone.  She has a mild limp now ? ?She continues to have vision problems since cataract surgery and complications with elevated IOP (R>L) ? ?She is sleeping poorly despite hydrocodone, baclofen, doxepin, melatonin, benadryl at night.  She sometimes take lorazepam at bedtime.   She rarely takes Ambien   ?   ? 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 She had optic neuritis in 1999. In 2002, she had right-sided  numbness. MRIs of the brain show just a couple of nonspecific foci. A cervical spine MRI showed a focus consistent with demyelination at C5. Additionally, there are 2 smaller foci at T2 and T3-T4.  CSF was also abnormal c/w MS.     She was initially placed on Betaseron and then on Copaxone. She was unable to tolerate either of these and has not been on any DMT medication since 2003.   She retried Betaseron but stopped after diagnosis of avascular necrosis requiring hip replacement.  She was on Copaxone for a while.   Of note, she had had only one course of IV steroids prior to that and has had some afterwards.   She had not wanted to go on Tysabri or Gilenya and opted to go on no disease modifying therapy since 2012. At her last visit on 01/17/2014, she decided to go on Aubagio but due to elevated liver function test stopped..     Most recent MRIs are from earlier this year and showed no changes.  She has been off of all disease modifying therapy since 2018. ? ?02/05/2018: Brain MRI showed "Multiple T2/FLAIR hyperintense foci in the hemispheres.  This is a nonspecific finding and could be due to chronic microvascular ischemic changes or 2 demyelination.  None of the foci appears to be acute.  When compared to the MRI dated 07/03/2016, there is no interval change.  There is a normal enhancement pattern. ? ? ?  REVIEW OF SYSTEMS: ?Constitutional: No fevers, chills, sweats, or change in appetite.  Has fatigue ?Eyes: No visual changes, double vision, eye pain ?Ear, nose and throat: No hearing loss, ear pain, nasal congestion, sore throat ?Cardiovascular: No chest pain, palpitations ?Respiratory:  No shortness of breath at rest or with exertion.   No wheezes ?GastrointestinaI: No nausea, vomiting, diarrhea, abdominal pain, fecal incontinence ?Genitourinary:  No dysuria, urinary retention or frequency.  No nocturia. ?Musculoskeletal:  She reports back pain and pain and some of her joints, especially her knees and  hips. ?Integumentary: No rash, pruritus, skin lesions ?Neurological: as above.  She reports restless leg, insomnia and excessive daytime sleepiness. ?Psychiatric: Has had depression and anxiety.  Endocrine: No palpitations, diaphoresis, change in appetite, change in weigh.  He notes heat intolerance and excessive thirst at times. ?Hematologic/Lymphatic:  No anemia, purpura, petechiae. ?Allergic/Immunologic: No itchy/runny eyes, nasal congestion, recent allergic reactions, rashes ? ?ALLERGIES: ?Allergies  ?Allergen Reactions  ? Diclofenac Anaphylaxis  ? Lamictal [Lamotrigine] Shortness Of Breath, Itching and Swelling  ?  Tongue swells  ? Mobic [Meloxicam] Anaphylaxis  ? Ultram [Tramadol Hcl] Anaphylaxis  ? Nefazodone Itching and Swelling  ?  Other reaction(s): Arthralgia (Joint Pain); Throat swelling, mouth sores  ? Betaseron [Interferon Beta-1b] Other (See Comments)  ?  Caused avascular necrosis  ? Codeine Itching  ? Copaxone [Glatiramer Acetate] Other (See Comments)  ?  Altered mental status: mental changes, anxiety, motion sickness  ? Gabapentin Itching  ?  Itching if she takes more than '300mg'$   ? Latex   ?  Latex band-aids cause redness and tears your skin  ? Nitrofurantoin   ?  Causing itching all over, tongue swelling, chest heaviness, swollen lips, cough, rapid HR, swollen eyelids, red eyes/gums, lips red/white per pt.  ? Oxcarbazepine Itching and Other (See Comments)  ?  Dizziness, throat hurts  ? Teriflunomide Diarrhea  ?  Hair loss, joint pains, elevated liver enzymes abugio (joint pains and liver issues)  ? Trazodone And Nefazodone   ?  Mouth sores, tongue swelling  ? Tape Rash  ? ? ?HOME MEDICATIONS: ? ?Current Outpatient Medications:  ?  amoxicillin-clavulanate (AUGMENTIN) 875-125 MG tablet, Take 1 tablet by mouth 2 (two) times daily for 10 days., Disp: 20 tablet, Rfl: 0 ?  Ascorbic Acid (VITAMIN C) 1000 MG tablet, Take 1,000 mg by mouth daily., Disp: , Rfl:  ?  aspirin EC 81 MG tablet, Take 81 mg by  mouth daily., Disp: , Rfl:  ?  AZO-CRANBERRY PO, Take 1 capsule by mouth daily at 12 noon., Disp: , Rfl:  ?  baclofen (LIORESAL) 10 MG tablet, TAKE 2 TABLETS BY MOUTH 3 TIMES DAILY., Disp: 540 tablet, Rfl: 3 ?  BLACK ELDERBERRY PO, Take 300 mg by mouth in the morning and at bedtime., Disp: , Rfl:  ?  Cholecalciferol (VITAMIN D) 50 MCG (2000 UT) CAPS, Take 2,000 Units by mouth daily. , Disp: , Rfl:  ?  CINNAMON PO, Take 1,200 mg by mouth daily. , Disp: , Rfl:  ?  diphenhydrAMINE (BENADRYL) 25 MG tablet, Take 25 mg by mouth 3 (three) times daily. Takes with Hydrocodone, Disp: , Rfl:  ?  doxepin (SINEQUAN) 10 MG capsule, Take 1-2 capsules (10-20 mg total) by mouth at bedtime., Disp: 180 capsule, Rfl: 1 ?  EPINEPHrine 0.3 mg/0.3 mL IJ SOAJ injection, Use as directed for life-threatening allergic reaction. (Patient not taking: Reported on 08/12/2021), Disp: 1 each, Rfl: 3 ?  famotidine (PEPCID) 20 MG tablet, TAKE  1 TABLET BY MOUTH TWICE A DAY, Disp: 180 tablet, Rfl: 1 ?  Fexofenadine-Pseudoephedrine (ALLEGRA-D PO), Take by mouth in the morning., Disp: , Rfl:  ?  Flaxseed, Linseed, (FLAX SEED OIL) 1000 MG CAPS, Take 1,200 mg by mouth daily. , Disp: , Rfl:  ?  gabapentin (NEURONTIN) 300 MG capsule, Take 1 capsule (300 mg total) by mouth 3 (three) times daily., Disp: 270 capsule, Rfl: 1 ?  HYDROcodone-acetaminophen (NORCO/VICODIN) 5-325 MG tablet, Take 1 tablet by mouth 3 (three) times daily as needed for moderate pain., Disp: 270 tablet, Rfl: 0 ?  ipratropium (ATROVENT) 0.06 % nasal spray, Use 2 sprays each nostril bid prn drainage, Disp: 30 mL, Rfl: 5 ?  L-Lysine 1000 MG TABS, Take by mouth. As needed, Disp: , Rfl:  ?  Lactobacillus (PROBIOTIC ACIDOPHILUS PO), Take by mouth daily. Digestive Advantage BC   bacillus coagulans, Disp: , Rfl:  ?  levocetirizine (XYZAL) 5 MG tablet, TAKE 1 TABLET BY MOUTH EVERY DAY IN THE EVENING, Disp: 90 tablet, Rfl: 2 ?  LORazepam (ATIVAN) 1 MG tablet, Take 1 tablet (1 mg total) by mouth at  bedtime., Disp: 30 tablet, Rfl: 5 ?  Lutein-Bilberry (BILBERRY PLUS LUTEIN PO), Take by mouth daily., Disp: , Rfl:  ?  Melatonin 10 MG TABS, Take 1 tablet by mouth at bedtime. , Disp: , Rfl:  ?  methylphenidate (CONCERT

## 2021-08-12 NOTE — Telephone Encounter (Signed)
..   Pt understands that although there may be some limitations with this type of visit, we will take all precautions to reduce any security or privacy concerns.  Pt understands that this will be treated like an in office visit and we will file with pt's insurance, and there may be a patient responsible charge related to this service. ? ?

## 2021-08-13 ENCOUNTER — Other Ambulatory Visit: Payer: Medicare Other

## 2021-08-13 ENCOUNTER — Telehealth: Payer: Medicare Other

## 2021-08-13 ENCOUNTER — Ambulatory Visit (INDEPENDENT_AMBULATORY_CARE_PROVIDER_SITE_OTHER): Payer: Medicare Other

## 2021-08-13 ENCOUNTER — Encounter: Payer: Self-pay | Admitting: Internal Medicine

## 2021-08-13 ENCOUNTER — Other Ambulatory Visit: Payer: Self-pay

## 2021-08-13 DIAGNOSIS — R7303 Prediabetes: Secondary | ICD-10-CM

## 2021-08-13 DIAGNOSIS — E559 Vitamin D deficiency, unspecified: Secondary | ICD-10-CM

## 2021-08-13 DIAGNOSIS — E6609 Other obesity due to excess calories: Secondary | ICD-10-CM

## 2021-08-13 DIAGNOSIS — G35 Multiple sclerosis: Secondary | ICD-10-CM

## 2021-08-13 DIAGNOSIS — E66811 Obesity, class 1: Secondary | ICD-10-CM

## 2021-08-13 DIAGNOSIS — F3342 Major depressive disorder, recurrent, in full remission: Secondary | ICD-10-CM

## 2021-08-13 MED ORDER — FLUCONAZOLE 150 MG PO TABS: ORAL_TABLET | ORAL | 0 refills | Status: DC

## 2021-08-13 NOTE — Chronic Care Management (AMB) (Signed)
?Chronic Care Management  ? ?CCM RN Visit Note ? ?08/13/2021 ?Name: Grace [name] MRN: [id number] DOB: 16-Jun-1960 ? ?Subjective: ?Grace [name] is a 61 y.o. year old female who is a primary care patient of [name], MD. The care management team was consulted for assistance with disease management and care coordination needs.   ? ?Engaged with patient by telephone for follow up visit in response to provider referral for case management and/or care coordination services.  ? ?Consent to Services:  ?The patient was given information about Chronic Care Management services, agreed to services, and gave verbal consent prior to initiation of services.  Please see initial visit note for detailed documentation.  ? ?Patient agreed to services and verbal consent obtained.  ? ?Assessment: Review of patient past medical history, allergies, medications, health status, including review of consultants reports, laboratory and other test data, was performed as part of comprehensive evaluation and provision of chronic care management services.  ? ?SDOH (Social Determinants of Health) assessments and interventions performed:  Yes, no acute needs  ? ?CCM Care Plan ? ?Allergies  ?Allergen Reactions  ? Diclofenac Anaphylaxis  ? Lamictal [Lamotrigine] Shortness Of Breath, Itching and Swelling  ?  Tongue swells  ? Mobic [Meloxicam] Anaphylaxis  ? Ultram [Tramadol Hcl] Anaphylaxis  ? Nefazodone Itching and Swelling  ?  Other reaction(s): Arthralgia (Joint Pain); Throat swelling, mouth sores  ? Betaseron [Interferon Beta-1b] Other (See Comments)  ?  Caused avascular necrosis  ? Codeine Itching  ? Copaxone [Glatiramer Acetate] Other (See Comments)  ?  Altered mental status: mental changes, anxiety, motion sickness  ? Gabapentin Itching  ?  Itching if she takes more than '300mg'$   ? Latex   ?  Latex band-aids cause redness and tears your skin  ? Nitrofurantoin   ?  Causing itching all over, tongue swelling, chest heaviness,  swollen lips, cough, rapid HR, swollen eyelids, red eyes/gums, lips red/white per pt.  ? Oxcarbazepine Itching and Other (See Comments)  ?  Dizziness, throat hurts  ? Teriflunomide Diarrhea  ?  Hair loss, joint pains, elevated liver enzymes abugio (joint pains and liver issues)  ? Trazodone And Nefazodone   ?  Mouth sores, tongue swelling  ? Tape Rash  ? ? ?Outpatient Encounter Medications as of 08/13/2021  ?Medication Sig  ? amoxicillin-clavulanate (AUGMENTIN) 875-125 MG tablet Take 1 tablet by mouth 2 (two) times daily for 10 days.  ? Ascorbic Acid (VITAMIN C) 1000 MG tablet Take 1,000 mg by mouth daily.  ? aspirin EC 81 MG tablet Take 81 mg by mouth daily.  ? AZO-CRANBERRY PO Take 1 capsule by mouth daily at 12 noon.  ? baclofen (LIORESAL) 10 MG tablet TAKE 2 TABLETS BY MOUTH 3 TIMES DAILY.  ? BLACK ELDERBERRY PO Take 300 mg by mouth in the morning and at bedtime.  ? Cholecalciferol (VITAMIN D) 50 MCG (2000 UT) CAPS Take 2,000 Units by mouth daily.   ? CINNAMON PO Take 1,200 mg by mouth daily.   ? diphenhydrAMINE (BENADRYL) 25 MG tablet Take 25 mg by mouth 3 (three) times daily. Takes with Hydrocodone  ? doxepin (SINEQUAN) 10 MG capsule Take 1-2 capsules (10-20 mg total) by mouth at bedtime.  ? EPINEPHrine 0.3 mg/0.3 mL IJ SOAJ injection Use as directed for life-threatening allergic reaction. (Patient not taking: Reported on 08/12/2021)  ? famotidine (PEPCID) 20 MG tablet TAKE 1 TABLET BY MOUTH TWICE A DAY  ? Fexofenadine-Pseudoephedrine (ALLEGRA-D PO) Take by mouth in the morning.  ?  Flaxseed, Linseed, (FLAX SEED OIL) 1000 MG CAPS Take 1,200 mg by mouth daily.   ? gabapentin (NEURONTIN) 300 MG capsule Take 1 capsule (300 mg total) by mouth 3 (three) times daily.  ? HYDROcodone-acetaminophen (NORCO/VICODIN) 5-325 MG tablet Take 1 tablet by mouth 3 (three) times daily as needed for moderate pain.  ? ipratropium (ATROVENT) 0.06 % nasal spray Use 2 sprays each nostril bid prn drainage  ? L-Lysine 1000 MG TABS Take by  mouth. As needed  ? Lactobacillus (PROBIOTIC ACIDOPHILUS PO) Take by mouth daily. Digestive Advantage ?BC   bacillus coagulans  ? levocetirizine (XYZAL) 5 MG tablet TAKE 1 TABLET BY MOUTH EVERY DAY IN THE EVENING  ? LORazepam (ATIVAN) 1 MG tablet Take 1 tablet (1 mg total) by mouth at bedtime.  ? Lutein-Bilberry (BILBERRY PLUS LUTEIN PO) Take by mouth daily.  ? Melatonin 10 MG TABS Take 1 tablet by mouth at bedtime.   ? methylphenidate (CONCERTA) 27 MG PO CR tablet Take 1 tablet (27 mg total) by mouth every morning.  ? Moringa 500 MG CAPS Take 2 capsules by mouth daily.  ? Multiple Vitamins-Minerals (HAIR/SKIN/NAILS/BIOTIN) TABS Take by mouth daily.   ? Nutritional Supplements (NUTRITIONAL SUPPLEMENT PO) Take 2.1 g by mouth in the morning and at bedtime. LION'S MANE  ? nystatin-triamcinolone ointment (MYCOLOG) Apply 1 application topically 2 (two) times daily.  ? promethazine-dextromethorphan (PROMETHAZINE-DM) 6.25-15 MG/5ML syrup Take 5 mLs by mouth 4 (four) times daily as needed for cough.  ? rizatriptan (MAXALT-MLT) 10 MG disintegrating tablet Take 1 tablet (10 mg total) by mouth as needed for migraine. May repeat in 2 hours if needed.  Max of 2 tablets in 24 hours, and 10 tablets per month  ? tiZANidine (ZANAFLEX) 2 MG tablet TAKE UP TO 4 PILLS A DAY  ? Vitamin D, Ergocalciferol, (DRISDOL) 1.25 MG (50000 UNIT) CAPS capsule ONE CAPSULE TWICE WEEKLY ON TUESDAYS/FRIDAYS  ? vitamin E 1000 UNIT capsule vitamin E 670 mg (1,000 unit) capsule ?  1 capsule every day by oral route.  ? zolpidem (AMBIEN) 10 MG tablet Take 10 mg by mouth at bedtime as needed. For sleep  ? ?No facility-administered encounter medications on file as of 08/13/2021.  ? ? ?Patient Active Problem List  ? Diagnosis Date Noted  ? Recurrent major depressive disorder, in full remission ([place]) 07/20/2021  ? Influenza vaccination declined 05/16/2021  ? New onset headache 05/16/2021  ? Urinary urgency 05/16/2021  ? Grief 05/16/2021  ? ADD (attention deficit  disorder) without hyperactivity 12/23/2020  ? Chronic prescription opiate use 12/17/2020  ? Numbness 12/17/2020  ? Arthritis 11/26/2020  ? Irritable bowel syndrome 11/26/2020  ? At risk for heart disease 04/15/2020  ? Other hyperlipidemia with hypertriglyceridemia 04/15/2020  ? Cystoid macular edema of right eye 03/25/2020  ? 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 Prediabetes 01/01/2020  ? Vitamin D deficiency 01/01/2020  ? Vitamin B 12 deficiency 01/01/2020  ? At risk for diabetes mellitus 01/01/2020  ? Class 2 severe obesity due to excess calories with serious comorbidity and body mass index (BMI) of 35.0 to 35.9 in adult [facility]) 01/01/2020  ? Anosmia 11/15/2019  ? Disturbance of smell and taste 11/15/2019  ? Epiretinal membrane, right 07/03/2019  ? Depression 03/06/2019  ? Diverticulosis 06/14/2018  ? Hepatic steatosis 06/14/2018  ? 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 Decreased pedal pulses 06/30/2016  ? Pain in both lower extremities 06/30/2016  ? Facial pain, atypical 06/08/2016  ?  Fibroids 02/12/2016  ? Chest pain 07/07/2015  ? Dizziness and giddiness 07/07/2015  ? Leiomyoma of uterus 07/07/2015  ? Lower abdominal pain 07/07/2015  ? Hip pain 03/03/2015  ? Optic neuritis 06/27/2014  ? Multiple sclerosis ([place]) 06/26/2014  ? 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/26/2014  ? Other fatigue 06/26/2014  ? Dysesthesia 06/26/2014  ? Depression with anxiety 06/26/2014  ? 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 Insomnia 06/26/2014  ? Low serum vitamin D 06/26/2014  ? ? ?Conditions to be addressed/monitored: MS, Vitamin D Deficiency, Recurrent major depression, Class 1 Obesity due to excess calories, Prediabetes ? ?Care Plan : RN Care Manager Plan of Care  ?Updates made by Lynne Logan, RN since 08/13/2021 12:00 AM  ?  ? ?Problem: No plan of Care established for management of chronic disease states (MS, Vitamin D Deficiency, Recurrent major depression, Class 1  Obesity due to excess calories, Prediabetes)   ?Priority: High  ?  ? ?Long-Range Goal: Establishment of plan of care for management of chronic disease states (MS, Vitamin D Deficiency, Recurrent major depre

## 2021-08-13 NOTE — Patient Instructions (Signed)
Visit Information ? ?Thank you for taking time to visit with me today. Please don't hesitate to contact me if I can be of assistance to you before our next scheduled telephone appointment. ? ?Following are the goals we discussed today:  ?(Copy and paste patient goals from clinical care plan here) ? ?Our next appointment is by telephone on 08/31/21 at 9:30 AM ? ?Please call the care guide team at [phone] if you need to cancel or reschedule your appointment.  ? ?If you are experiencing a Mental Health or [place] or need someone to talk to, please call [phone]-TALK (toll free, 24 hour hotline)  ? ?Patient verbalizes understanding of instructions and care plan provided today and agrees to view in [place]. Active MyChart status confirmed with patient.   ? ?Barb Merino, RN, BSN, CCM ?Care Management Coordinator ?[place] [facility]  ?Direct Phone: [phone] ? ? ?

## 2021-08-14 ENCOUNTER — Telehealth (INDEPENDENT_AMBULATORY_CARE_PROVIDER_SITE_OTHER): Payer: Self-pay

## 2021-08-14 LAB — NOVEL CORONAVIRUS, NAA: SARS-CoV-2, NAA: NOT DETECTED

## 2021-08-14 NOTE — Telephone Encounter (Signed)
I left the pt a message that Dr. Baird Cancer wanted to check and see how the patient is doing. ?

## 2021-08-17 ENCOUNTER — Ambulatory Visit
Admission: RE | Admit: 2021-08-17 | Discharge: 2021-08-17 | Disposition: A | Discharge status: Home / Self Care | Payer: Medicare Other | Source: Ambulatory Visit | Attending: Internal Medicine | Admitting: Internal Medicine

## 2021-08-17 DIAGNOSIS — R051 Acute cough: Secondary | ICD-10-CM

## 2021-08-19 ENCOUNTER — Telehealth: Payer: Self-pay

## 2021-08-19 NOTE — Chronic Care Management (AMB) (Signed)
? ? ?Chronic Care Management ?Pharmacy Assistant  ? ?Name: Grace [name]  MRN: [id number] DOB: 1961-05-02 ? ?Reason for Encounter: Disease State/ General ? ?Recent office visits:  ?08-13-2021 Little, Grace Stapler, RN (CCM). ? ?08-12-2021 Grace Chard, MD. Negative covid test. DG Chest 2 View ordered. START augmentin 875-125 mg twice daily and Promethazine-DM 5 mLs by mouth 4 times daily as needed. ? ?07-21-2021 Grace [name] (CCM). ? ?07-20-2021 Grace Chard, MD. BUN/Creatinine= 28, Alkaline= 134, ALT= 35. Referral placed to cardiology. ? ?07-01-2021 Little, Grace Stapler, RN (CCM). ? ?06-30-2021 Grace [name] (CCM). ? ?05-25-2021 Grace [name] (CCM). ? ?05-20-2021 Grace Chard, MD. START Magnesium Glycinate 100 mg nightly. ? ?Recent consult visits:  ?08-12-2021 Grace Bottom, MD (Neurology). Follow up visit for multiple sclerosis. No changes. Follow up in 4 months. ? ?08-07-2021 Grace Dock, MD (Ophthalmology). STOPPED atropine 1 % ophthalmic solution, metformin and timolol 0.5 % ophthalmic solution. ? ?07-16-2021 Grace Queen, MD (OBGYN). Visit for Gynecologic Examination. ? ?07-13-2021 Grace Oh, MD (Ophthalmology). Visit for Glaucoma Evaluation ? ?05-26-2021 ECHOCARDIOGRAM completed. ? ?Hospital visits:  ?None in previous 6 months ? ?Medications: ?Outpatient Encounter Medications as of 08/19/2021  ?Medication Sig  ? amoxicillin-clavulanate (AUGMENTIN) 875-125 MG tablet Take 1 tablet by mouth 2 (two) times daily for 10 days.  ? Ascorbic Acid (VITAMIN C) 1000 MG tablet Take 1,000 mg by mouth daily.  ? aspirin EC 81 MG tablet Take 81 mg by mouth daily.  ? AZO-CRANBERRY PO Take 1 capsule by mouth daily at 12 noon.  ? baclofen (LIORESAL) 10 MG tablet TAKE 2 TABLETS BY MOUTH 3 TIMES DAILY.  ? BLACK ELDERBERRY PO Take 300 mg by mouth in the morning and at bedtime.  ? Cholecalciferol (VITAMIN D) 50 MCG (2000 UT) CAPS Take 2,000 Units by mouth daily.   ? CINNAMON PO Take 1,200 mg by mouth  daily.   ? diphenhydrAMINE (BENADRYL) 25 MG tablet Take 25 mg by mouth 3 (three) times daily. Takes with Hydrocodone  ? doxepin (SINEQUAN) 10 MG capsule Take 1-2 capsules (10-20 mg total) by mouth at bedtime.  ? EPINEPHrine 0.3 mg/0.3 mL IJ SOAJ injection Use as directed for life-threatening allergic reaction. (Patient not taking: Reported on 08/12/2021)  ? famotidine (PEPCID) 20 MG tablet TAKE 1 TABLET BY MOUTH TWICE A DAY  ? Fexofenadine-Pseudoephedrine (ALLEGRA-D PO) Take by mouth in the morning.  ? Flaxseed, Linseed, (FLAX SEED OIL) 1000 MG CAPS Take 1,200 mg by mouth daily.   ? fluconazole (DIFLUCAN) 150 MG tablet Take one tablet by mouth at the onset of symptoms then repeat in 48 hours  ? gabapentin (NEURONTIN) 300 MG capsule Take 1 capsule (300 mg total) by mouth 3 (three) times daily.  ? HYDROcodone-acetaminophen (NORCO/VICODIN) 5-325 MG tablet Take 1 tablet by mouth 3 (three) times daily as needed for moderate pain.  ? ipratropium (ATROVENT) 0.06 % nasal spray Use 2 sprays each nostril bid prn drainage  ? L-Lysine 1000 MG TABS Take by mouth. As needed  ? Lactobacillus (PROBIOTIC ACIDOPHILUS PO) Take by mouth daily. Digestive Advantage ?BC   bacillus coagulans  ? levocetirizine (XYZAL) 5 MG tablet TAKE 1 TABLET BY MOUTH EVERY DAY IN THE EVENING  ? LORazepam (ATIVAN) 1 MG tablet Take 1 tablet (1 mg total) by mouth at bedtime.  ? Lutein-Bilberry (BILBERRY PLUS LUTEIN PO) Take by mouth daily.  ? Melatonin 10 MG TABS Take 1 tablet by mouth at bedtime.   ? methylphenidate (CONCERTA) 27 MG PO CR tablet Take 1  tablet (27 mg total) by mouth every morning.  ? Moringa 500 MG CAPS Take 2 capsules by mouth daily.  ? Multiple Vitamins-Minerals (HAIR/SKIN/NAILS/BIOTIN) TABS Take by mouth daily.   ? Nutritional Supplements (NUTRITIONAL SUPPLEMENT PO) Take 2.1 g by mouth in the morning and at bedtime. LION'S MANE  ? nystatin-triamcinolone ointment (MYCOLOG) Apply 1 application topically 2 (two) times daily.  ?  promethazine-dextromethorphan (PROMETHAZINE-DM) 6.25-15 MG/5ML syrup Take 5 mLs by mouth 4 (four) times daily as needed for cough.  ? rizatriptan (MAXALT-MLT) 10 MG disintegrating tablet Take 1 tablet (10 mg total) by mouth as needed for migraine. May repeat in 2 hours if needed.  Max of 2 tablets in 24 hours, and 10 tablets per month  ? tiZANidine (ZANAFLEX) 2 MG tablet TAKE UP TO 4 PILLS A DAY  ? Vitamin D, Ergocalciferol, (DRISDOL) 1.25 MG (50000 UNIT) CAPS capsule ONE CAPSULE TWICE WEEKLY ON TUESDAYS/FRIDAYS  ? vitamin E 1000 UNIT capsule vitamin E 670 mg (1,000 unit) capsule ?  1 capsule every day by oral route.  ? zolpidem (AMBIEN) 10 MG tablet Take 10 mg by mouth at bedtime as needed. For sleep  ? ?No facility-administered encounter medications on file as of 08/19/2021.  ?Contacted Grace [name] for general disease state and medication adherence call.  ? ?Patient is not > 5 days past due for refill on the following medications per chart history: ? ?What concerns do you have about your medications? Patient stated no ? ?The patient denies side effects with her medications.  ? ?How often do you forget or accidentally miss a dose? Never ? ?Do you use a pillbox? No ? ?Are you having any problems getting your medications from your pharmacy? No ? ?Has the cost of your medications been a concern? No ? ?Since last visit with CPP, no interventions have been made:  ? ?The patient has not had an ED visit since last contact.  ? ?The patient denies problems with their health.  ? ?she denies  concerns or questions for Grace [name], at this time.  ? ?Patient states she doesn't have to check blood pressure or blood sugar ? ?Care Gaps: ?Covid vaccine overdue ?Shingrix overdue ?AWV 10-21-2021 ? ?Star Rating Drugs: ?None ? ?Grace [name] CMA ?Clinical Pharmacist Assistant ?[phone] ? ?

## 2021-08-28 ENCOUNTER — Ambulatory Visit
Admission: RE | Admit: 2021-08-28 | Discharge: 2021-08-28 | Disposition: A | Discharge status: Home / Self Care | Payer: Medicare Other | Source: Ambulatory Visit | Attending: Internal Medicine | Admitting: Internal Medicine

## 2021-08-28 DIAGNOSIS — Z1231 Encounter for screening mammogram for malignant neoplasm of breast: Secondary | ICD-10-CM

## 2021-08-31 ENCOUNTER — Telehealth: Payer: Self-pay

## 2021-08-31 ENCOUNTER — Telehealth: Payer: Medicare Other

## 2021-08-31 NOTE — Telephone Encounter (Signed)
?  Care Management  ? ?Follow Up Note ? ? ?08/31/2021 ?Name: CAMRON ESSMAN MRN: [id number] DOB: 1960/08/30 ? ? ?Referred by: [name], MD ?Reason for referral : Chronic Care Management (RN CM Follow up call) ? ? ?An unsuccessful telephone outreach was attempted today. The patient was referred to the case management team for assistance with care management and care coordination.  ? ?Follow Up Plan: The care management team will reach out to the patient again over the next 7-14 days.  ? ?Barb Merino, RN, BSN, CCM ?Care Management Coordinator ?[place] [facility]  ?Direct Phone: [phone] ? ? ?

## 2021-09-06 DIAGNOSIS — F3342 Major depressive disorder, recurrent, in full remission: Secondary | ICD-10-CM

## 2021-09-09 ENCOUNTER — Other Ambulatory Visit: Payer: Self-pay | Admitting: Obstetrics and Gynecology

## 2021-09-09 DIAGNOSIS — Z9189 Other specified personal risk factors, not elsewhere classified: Secondary | ICD-10-CM

## 2021-09-10 ENCOUNTER — Ambulatory Visit (INDEPENDENT_AMBULATORY_CARE_PROVIDER_SITE_OTHER): Payer: Medicare Other | Admitting: Cardiovascular Disease

## 2021-09-10 VITALS — BP 136/82 | HR 80 | Ht 66.0 in | Wt 217.0 lb

## 2021-09-10 DIAGNOSIS — G35 Multiple sclerosis: Secondary | ICD-10-CM | POA: Diagnosis not present

## 2021-09-10 DIAGNOSIS — I5189 Other ill-defined heart diseases: Secondary | ICD-10-CM

## 2021-09-10 DIAGNOSIS — R0789 Other chest pain: Secondary | ICD-10-CM

## 2021-09-10 DIAGNOSIS — G629 Polyneuropathy, unspecified: Secondary | ICD-10-CM

## 2021-09-10 DIAGNOSIS — R002 Palpitations: Secondary | ICD-10-CM

## 2021-09-10 DIAGNOSIS — E669 Obesity, unspecified: Secondary | ICD-10-CM

## 2021-09-10 NOTE — Patient Instructions (Signed)
Medication Instructions:  ?The current medical regimen is effective;  continue present plan and medications. ? ?*If you need a refill on your cardiac medications before your next appointment, please call your pharmacy* ? ? ? ?Follow-Up: ?At [facility], you and your health needs are our priority.  As part of our continuing mission to provide you with exceptional heart care, we have created designated Provider Care Teams.  These Care Teams include your primary Cardiologist (physician) and Advanced Practice Providers (APPs -  Physician Assistants and Nurse Practitioners) who all work together to provide you with the care you need, when you need it. ? ?We recommend signing up for the patient portal called "MyChart".  Sign up information is provided on this After Visit Summary.  MyChart is used to connect with patients for Virtual Visits (Telemedicine).  Patients are able to view lab/test results, encounter notes, upcoming appointments, etc.  Non-urgent messages can be sent to your provider as well.   ?To learn more about what you can do with MyChart, go to [email or web address].   ? ?Your next appointment:   ?As needed ? ?The format for your next appointment:   ?In Person ? ?Provider:   ?Shelva Majestic, MD  ? ? ? ? ? ? ? ? ? ?

## 2021-09-10 NOTE — Progress Notes (Signed)
? ?Cardiology Office Note   ? ?Date:  09/19/2021  ? ?ID:  Grace [name], DOB 14-Sep-1960, MRN [id number] ? ?PCP:  [name], MD  ?Cardiologist:  Shelva Majestic, MD  ? ?Chief Complaint  ?Patient presents with  ? New Patient (Initial Visit)  ? Palpitations  ? Chest Pain  ? Headache  ? Shortness of Breath  ? Edema  ? ?Cardiology consultation referred through the courtesy of Dr. [name] ? ?History of Present Illness:  ?Grace [name] is a 61 y.o. female who is referred through the courtesy of Dr. [name] for evaluation of palpitations. ? ?Grace [name] has a history of multiple sclerosis, transverse myelitis and remotely had seen Dr. Skeet Latch in October 2017.  At that time an EKG was reported as showing atrial flutter but actually revealed sinus tachycardia at a rate of 111 bpm.  She subsequently underwent a [place] study for chest pain which showed EF 63% without perfusion defects.  She wore a 7-day event monitor in August 2017 which essentially was normal.  Apparently, remotely in 2013 she had a Myoview study that was negative for ischemia but continued to have recurrent chest pain and subsequently underwent left heart cardiac catheterization in February 2013 which did not show any CAD. ? ?She underwent an echo Doppler study in January 2023 which was ordered by Dr. Baird Cancer.  This revealed normal LV function with EF 60 to 65%.  There was normal wall motion.  There was grade 1 diastolic dysfunction. ? ?Recently, she again has noticed some episodic palpitations she also has noticed left-sided chest pressure going to her back.  This is not exertionally precipitated and at times can last hours up to days and ultimately resolves.  She has been using Allegra-D for allergies.  She had recently seen Dr. Baird Cancer on July 20, 2021 and was referred for cardiology reassessment.  The patient is follow-up by Dr. Felecia Shelling at [facility] neurology for multiple sclerosis and apparently has been off medications  for the last 5 years.  She has a history of neuropathy and is on gabapentin. ? ? ?Past Medical History:  ?Diagnosis Date  ? Allergy   ? Anxiety   ? Avascular necrosis ([place])   ? Back pain   ? Cancer [facility])   ? Chest pain   ? Chewing difficulty   ? Chronic fatigue syndrome   ? Colon polyp   ? pre-cancerous  ? Complication of anesthesia   ? patient states she requires more anesthesia  ? Depression   ? Disorder of soft tissue   ? Diverticulosis   ? Dizziness   ? Edema, lower extremity   ? Fatty liver   ? Fibromyalgia   ? GERD (gastroesophageal reflux disease)   ? H/O breast biopsy   ? pre- cancerous cells  ? Hand fracture, left   ? 9/87  ? Heart palpitations   ? Heart valve disease   ? History of Holter monitoring   ? Hyperlipidemia   ? Insomnia   ? Joint pain   ? Lactose intolerance   ? Lhermitte's syndrome   ? Migraines   ? Multiple food allergies   ? Multiple sclerosis ([place])   ? dx in 11/1996  ? Multiple sclerosis ([place])   ? Neuromuscular disorder ([place])   ? Neuropathy   ? Numbness and tingling in hands   ? Obesity   ? Optic neuritis   ? OSA (obstructive sleep apnea)   ? no longer have OSA since stopped  taking a MS medication  ? Osteoarthritis   ? Otitis media   ? Palpitations   ? Prediabetes   ? Raynaud's phenomenon   ? Shortness of breath   ? Sleep apnea   ? Due to medication that she no longer takes.  ? Swallowing difficulty   ? Vaginitis and vulvovaginitis   ? Vitamin B12 deficiency   ? Vitamin D deficiency   ? ? ?Past Surgical History:  ?Procedure Laterality Date  ? ABDOMINAL HYSTERECTOMY Bilateral 02/12/2016  ? Procedure: HYSTERECTOMY ABDOMINAL WITH BILATERAL SALPINGO OOPHERECTOMY;  Surgeon: Dian Queen, MD;  Location: [facility];  Service: Gynecology;  Laterality: Bilateral;  ? BREAST EXCISIONAL BIOPSY    ? core/left breast  ? CATARACT EXTRACTION, BILATERAL  06/2019  ? CHOLECYSTECTOMY  1983  ? COLONOSCOPY    ? 1997/2001  ? DILATION AND CURETTAGE OF UTERUS    ? ENDOMETRIAL ABLATION  2012  ? LAPAROSCOPIC ABDOMINAL  EXPLORATION    ? ovaries and intestines bond together  ? LEFT HEART CATHETERIZATION WITH CORONARY ANGIOGRAM N/A 06/21/2011  ? Procedure: LEFT HEART CATHETERIZATION WITH CORONARY ANGIOGRAM;  Surgeon: Leonie Man, MD;  Location: [facility] CATH LAB;  Service: Cardiovascular;  Laterality: N/A;  ? LUMBAR PUNCTURE    ? 11/01/1996  ? SALPINGOOPHORECTOMY Bilateral 02/12/2016  ? Procedure: BILATERAL SALPINGO OOPHORECTOMY;  Surgeon: Dian Queen, MD;  Location: [facility];  Service: Gynecology;  Laterality: Bilateral;  ? TOTAL HIP ARTHROPLASTY    ? left hip  6/02  /right hip12/04  ? ? ?Current Medications: ?Outpatient Medications Prior to Visit  ?Medication Sig Dispense Refill  ? Ascorbic Acid (VITAMIN C) 1000 MG tablet Take 1,000 mg by mouth daily.    ? aspirin EC 81 MG tablet Take 81 mg by mouth daily.    ? AZO-CRANBERRY PO Take 1 capsule by mouth daily at 12 noon.    ? baclofen (LIORESAL) 10 MG tablet TAKE 2 TABLETS BY MOUTH 3 TIMES DAILY. 540 tablet 3  ? BLACK ELDERBERRY PO Take 300 mg by mouth in the morning and at bedtime.    ? Cholecalciferol (VITAMIN D) 50 MCG (2000 UT) CAPS Take 2,000 Units by mouth daily.     ? CINNAMON PO Take 1,200 mg by mouth daily.     ? diphenhydrAMINE (BENADRYL) 25 MG tablet Take 25 mg by mouth 3 (three) times daily. Takes with Hydrocodone    ? doxepin (SINEQUAN) 10 MG capsule Take 1-2 capsules (10-20 mg total) by mouth at bedtime. 180 capsule 1  ? EPINEPHrine 0.3 mg/0.3 mL IJ SOAJ injection Use as directed for life-threatening allergic reaction. 1 each 3  ? famotidine (PEPCID) 20 MG tablet TAKE 1 TABLET BY MOUTH TWICE A DAY 180 tablet 1  ? Fexofenadine-Pseudoephedrine (ALLEGRA-D PO) Take by mouth in the morning.    ? Flaxseed, Linseed, (FLAX SEED OIL) 1000 MG CAPS Take 1,200 mg by mouth daily.     ? fluconazole (DIFLUCAN) 150 MG tablet Take one tablet by mouth at the onset of symptoms then repeat in 48 hours 2 tablet 0  ? gabapentin (NEURONTIN) 300 MG capsule Take 1 capsule (300 mg total) by mouth  3 (three) times daily. 270 capsule 1  ? HYDROcodone-acetaminophen (NORCO/VICODIN) 5-325 MG tablet Take 1 tablet by mouth 3 (three) times daily as needed for moderate pain. 270 tablet 0  ? ipratropium (ATROVENT) 0.06 % nasal spray Use 2 sprays each nostril bid prn drainage 30 mL 5  ? L-Lysine 1000 MG TABS Take by mouth. As needed    ?  Lactobacillus (PROBIOTIC ACIDOPHILUS PO) Take by mouth daily. Digestive Advantage ?BC   bacillus coagulans    ? levocetirizine (XYZAL) 5 MG tablet TAKE 1 TABLET BY MOUTH EVERY DAY IN THE EVENING 90 tablet 2  ? LORazepam (ATIVAN) 1 MG tablet Take 1 tablet (1 mg total) by mouth at bedtime. 30 tablet 5  ? Lutein-Bilberry (BILBERRY PLUS LUTEIN PO) Take by mouth daily.    ? Melatonin 10 MG TABS Take 1 tablet by mouth at bedtime.     ? methylphenidate (CONCERTA) 27 MG PO CR tablet Take 1 tablet (27 mg total) by mouth every morning. 90 tablet 0  ? Moringa 500 MG CAPS Take 2 capsules by mouth daily.    ? Multiple Vitamins-Minerals (HAIR/SKIN/NAILS/BIOTIN) TABS Take by mouth daily.     ? Nutritional Supplements (NUTRITIONAL SUPPLEMENT PO) Take 2.1 g by mouth in the morning and at bedtime. LION'S MANE    ? nystatin-triamcinolone ointment (MYCOLOG) Apply 1 application topically 2 (two) times daily. 30 g 2  ? promethazine-dextromethorphan (PROMETHAZINE-DM) 6.25-15 MG/5ML syrup Take 5 mLs by mouth 4 (four) times daily as needed for cough. 118 mL 0  ? rizatriptan (MAXALT-MLT) 10 MG disintegrating tablet Take 1 tablet (10 mg total) by mouth as needed for migraine. May repeat in 2 hours if needed.  Max of 2 tablets in 24 hours, and 10 tablets per month 10 tablet 11  ? tiZANidine (ZANAFLEX) 2 MG tablet TAKE UP TO 4 PILLS A DAY 360 tablet 1  ? Vitamin D, Ergocalciferol, (DRISDOL) 1.25 MG (50000 UNIT) CAPS capsule ONE CAPSULE TWICE WEEKLY ON TUESDAYS/FRIDAYS 24 capsule 2  ? vitamin E 1000 UNIT capsule vitamin E 670 mg (1,000 unit) capsule ?  1 capsule every day by oral route.    ? zolpidem (AMBIEN) 10 MG  tablet Take 10 mg by mouth at bedtime as needed. For sleep    ? ?No facility-administered medications prior to visit.  ?  ? ?Allergies:   Diclofenac, Lamictal [lamotrigine], Mobic [meloxicam], Ultram [tr

## 2021-09-16 ENCOUNTER — Telehealth: Payer: Medicare Other

## 2021-09-19 ENCOUNTER — Encounter: Payer: Self-pay | Admitting: Cardiovascular Disease

## 2021-09-21 ENCOUNTER — Ambulatory Visit (INDEPENDENT_AMBULATORY_CARE_PROVIDER_SITE_OTHER): Payer: Medicare Other

## 2021-09-21 ENCOUNTER — Telehealth: Payer: Medicare Other

## 2021-09-21 DIAGNOSIS — G35 Multiple sclerosis: Secondary | ICD-10-CM

## 2021-09-21 DIAGNOSIS — R7303 Prediabetes: Secondary | ICD-10-CM

## 2021-09-21 DIAGNOSIS — F3342 Major depressive disorder, recurrent, in full remission: Secondary | ICD-10-CM

## 2021-09-21 DIAGNOSIS — E559 Vitamin D deficiency, unspecified: Secondary | ICD-10-CM

## 2021-09-21 NOTE — Chronic Care Management (AMB) (Signed)
?Chronic Care Management  ? ? Social Work Note ? ?09/21/2021 ?Name: Grace [name] MRN: [id number] DOB: 29-Nov-1960 ? ?Grace [name] is a 61 y.o. year old female who is a primary care patient of [name], MD. The CCM team was consulted to assist the patient with chronic disease management and/or care coordination needs related to:  MS, Recurrent Major Depression, Prediabetes, Vitamin D Deficiency .  ? ?Engaged with patient by telephone for follow up visit in response to provider referral for social work chronic care management and care coordination services.  ? ?Consent to Services:  ?The patient was given information about Chronic Care Management services, agreed to services, and gave verbal consent prior to initiation of services.  Please see initial visit note for detailed documentation.  ? ?Patient agreed to services and consent obtained.  ? ?Assessment: Review of patient past medical history, allergies, medications, and health status, including review of relevant consultants reports was performed today as part of a comprehensive evaluation and provision of chronic care management and care coordination services.    ? ?SDOH (Social Determinants of Health) assessments and interventions performed:  ?SDOH Interventions   ? ?Flowsheet Row Most Recent Value  ?SDOH Interventions   ?Food Insecurity Interventions Intervention Not Indicated  ?Housing Interventions Intervention Not Indicated  ?Transportation Interventions Intervention Not Indicated  ? ?  ?  ? ?Advanced Directives Status: Not addressed in this encounter. ? ?CCM Care Plan ? ?Allergies  ?Allergen Reactions  ? Diclofenac Anaphylaxis  ? Lamictal [Lamotrigine] Shortness Of Breath, Itching and Swelling  ?  Tongue swells  ? Mobic [Meloxicam] Anaphylaxis  ? Ultram [Tramadol Hcl] Anaphylaxis  ? Nefazodone Itching and Swelling  ?  Other reaction(s): Arthralgia (Joint Pain); Throat swelling, mouth sores  ? Betaseron [Interferon Beta-1b] Other (See Comments)  ?   Caused avascular necrosis  ? Codeine Itching  ? Copaxone [Glatiramer Acetate] Other (See Comments)  ?  Altered mental status: mental changes, anxiety, motion sickness  ? Gabapentin Itching  ?  Itching if she takes more than '300mg'$   ? Latex   ?  Latex band-aids cause redness and tears your skin  ? Nitrofurantoin   ?  Causing itching all over, tongue swelling, chest heaviness, swollen lips, cough, rapid HR, swollen eyelids, red eyes/gums, lips red/white per pt.  ? Oxcarbazepine Itching and Other (See Comments)  ?  Dizziness, throat hurts  ? Teriflunomide Diarrhea  ?  Hair loss, joint pains, elevated liver enzymes abugio (joint pains and liver issues)  ? Trazodone And Nefazodone   ?  Mouth sores, tongue swelling  ? Tape Rash  ? ? ?Outpatient Encounter Medications as of 09/21/2021  ?Medication Sig  ? Ascorbic Acid (VITAMIN C) 1000 MG tablet Take 1,000 mg by mouth daily.  ? aspirin EC 81 MG tablet Take 81 mg by mouth daily.  ? AZO-CRANBERRY PO Take 1 capsule by mouth daily at 12 noon.  ? baclofen (LIORESAL) 10 MG tablet TAKE 2 TABLETS BY MOUTH 3 TIMES DAILY.  ? BLACK ELDERBERRY PO Take 300 mg by mouth in the morning and at bedtime.  ? Cholecalciferol (VITAMIN D) 50 MCG (2000 UT) CAPS Take 2,000 Units by mouth daily.   ? CINNAMON PO Take 1,200 mg by mouth daily.   ? diphenhydrAMINE (BENADRYL) 25 MG tablet Take 25 mg by mouth 3 (three) times daily. Takes with Hydrocodone  ? doxepin (SINEQUAN) 10 MG capsule Take 1-2 capsules (10-20 mg total) by mouth at bedtime.  ? EPINEPHrine 0.3 mg/0.3 mL IJ  SOAJ injection Use as directed for life-threatening allergic reaction.  ? famotidine (PEPCID) 20 MG tablet TAKE 1 TABLET BY MOUTH TWICE A DAY  ? Fexofenadine-Pseudoephedrine (ALLEGRA-D PO) Take by mouth in the morning.  ? Flaxseed, Linseed, (FLAX SEED OIL) 1000 MG CAPS Take 1,200 mg by mouth daily.   ? fluconazole (DIFLUCAN) 150 MG tablet Take one tablet by mouth at the onset of symptoms then repeat in 48 hours  ? gabapentin (NEURONTIN)  300 MG capsule Take 1 capsule (300 mg total) by mouth 3 (three) times daily.  ? HYDROcodone-acetaminophen (NORCO/VICODIN) 5-325 MG tablet Take 1 tablet by mouth 3 (three) times daily as needed for moderate pain.  ? ipratropium (ATROVENT) 0.06 % nasal spray Use 2 sprays each nostril bid prn drainage  ? L-Lysine 1000 MG TABS Take by mouth. As needed  ? Lactobacillus (PROBIOTIC ACIDOPHILUS PO) Take by mouth daily. Digestive Advantage ?BC   bacillus coagulans  ? levocetirizine (XYZAL) 5 MG tablet TAKE 1 TABLET BY MOUTH EVERY DAY IN THE EVENING  ? LORazepam (ATIVAN) 1 MG tablet Take 1 tablet (1 mg total) by mouth at bedtime.  ? Lutein-Bilberry (BILBERRY PLUS LUTEIN PO) Take by mouth daily.  ? Melatonin 10 MG TABS Take 1 tablet by mouth at bedtime.   ? methylphenidate (CONCERTA) 27 MG PO CR tablet Take 1 tablet (27 mg total) by mouth every morning.  ? Moringa 500 MG CAPS Take 2 capsules by mouth daily.  ? Multiple Vitamins-Minerals (HAIR/SKIN/NAILS/BIOTIN) TABS Take by mouth daily.   ? Nutritional Supplements (NUTRITIONAL SUPPLEMENT PO) Take 2.1 g by mouth in the morning and at bedtime. LION'S MANE  ? nystatin-triamcinolone ointment (MYCOLOG) Apply 1 application topically 2 (two) times daily.  ? promethazine-dextromethorphan (PROMETHAZINE-DM) 6.25-15 MG/5ML syrup Take 5 mLs by mouth 4 (four) times daily as needed for cough.  ? rizatriptan (MAXALT-MLT) 10 MG disintegrating tablet Take 1 tablet (10 mg total) by mouth as needed for migraine. May repeat in 2 hours if needed.  Max of 2 tablets in 24 hours, and 10 tablets per month  ? tiZANidine (ZANAFLEX) 2 MG tablet TAKE UP TO 4 PILLS A DAY  ? Vitamin D, Ergocalciferol, (DRISDOL) 1.25 MG (50000 UNIT) CAPS capsule ONE CAPSULE TWICE WEEKLY ON TUESDAYS/FRIDAYS  ? vitamin E 1000 UNIT capsule vitamin E 670 mg (1,000 unit) capsule ?  1 capsule every day by oral route.  ? zolpidem (AMBIEN) 10 MG tablet Take 10 mg by mouth at bedtime as needed. For sleep  ? ?No facility-administered  encounter medications on file as of 09/21/2021.  ? ? ?Patient Active Problem List  ? Diagnosis Date Noted  ? Recurrent major depressive disorder, in full remission ([place]) 07/20/2021  ? Influenza vaccination declined 05/16/2021  ? New onset headache 05/16/2021  ? Urinary urgency 05/16/2021  ? Grief 05/16/2021  ? 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 Chronic prescription opiate use 12/17/2020  ? Numbness 12/17/2020  ? Arthritis 11/26/2020  ? Irritable bowel syndrome 11/26/2020  ? At risk for heart disease 04/15/2020  ? Other hyperlipidemia with hypertriglyceridemia 04/15/2020  ? Cystoid macular edema of right eye 03/25/2020  ? 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 Prediabetes 01/01/2020  ? Vitamin D deficiency 01/01/2020  ? Vitamin B 12 deficiency 01/01/2020  ? At risk for diabetes mellitus 01/01/2020  ? Class 2 severe obesity due to excess calories with serious comorbidity and body mass index (BMI) of 35.0 to 35.9 in  adult [facility]) 01/01/2020  ? Anosmia 11/15/2019  ? Disturbance of smell and taste 11/15/2019  ? Epiretinal membrane, right 07/03/2019  ? Depression 03/06/2019  ? Diverticulosis 06/14/2018  ? Hepatic steatosis 06/14/2018  ? 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 Decreased pedal pulses 06/30/2016  ? Pain in both lower extremities 06/30/2016  ? Facial pain, atypical 06/08/2016  ? Fibroids 02/12/2016  ? Chest pain 07/07/2015  ? Dizziness and giddiness 07/07/2015  ? Leiomyoma of uterus 07/07/2015  ? Lower abdominal pain 07/07/2015  ? Hip pain 03/03/2015  ? Optic neuritis 06/27/2014  ? Multiple sclerosis ([place]) 06/26/2014  ? 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/26/2014  ? Other fatigue 06/26/2014  ? Dysesthesia 06/26/2014  ? Depression with anxiety 06/26/2014  ? Transverse myelitis ([place]) 06/26/2014  ? Insomnia 06/26/2014  ? Low serum vitamin D 06/26/2014  ? ? ?Conditions to be addressed/monitored:  MS, Recurrent Major  Depression, Prediabetes, Vitamin D Deficiency ? ?Care Plan : Social Work [place]  ?Updates made by Daneen Schick since 09/21/2021 12:00 AM  ?Completed 09/21/2021  ? ?Problem: Quality of Life (General

## 2021-09-21 NOTE — Patient Instructions (Signed)
Social Worker Visit Information ? ?Goals we discussed today:  ? Goals Addressed   ? ?  ?  ?  ?  ? This Visit's Progress  ?  COMPLETED: Quality of Life Maintained     ?  Timeframe:  Long-Range Goal ?Priority:  Medium ?Start Date: 6.8.22                            ?              ?Patient Goals/Self-Care Activities ?patient will:  ?-Attend upcomming primary care provider appointment in June ?-Contact SW as needed for care coordination needs ?  ? ?  ?  ? ? ?Patient verbalizes understanding of instructions and care plan provided today and agrees to view in [place]. Active MyChart status confirmed with patient.   ? ?Follow Up Plan:  No follow up planned at this time. Please contact me as needed. ? ? ?Daneen Schick, BSW, CDP ?Social Worker, Certified Dementia Practitioner ?[facility] [employer] ?[phone] ? ?   ? ?

## 2021-10-07 DIAGNOSIS — F3342 Major depressive disorder, recurrent, in full remission: Secondary | ICD-10-CM

## 2021-10-09 ENCOUNTER — Other Ambulatory Visit: Payer: Self-pay | Admitting: Internal Medicine

## 2021-10-21 ENCOUNTER — Encounter: Payer: Self-pay | Admitting: Internal Medicine

## 2021-10-21 ENCOUNTER — Ambulatory Visit (INDEPENDENT_AMBULATORY_CARE_PROVIDER_SITE_OTHER): Payer: Medicare Other

## 2021-10-21 ENCOUNTER — Ambulatory Visit (INDEPENDENT_AMBULATORY_CARE_PROVIDER_SITE_OTHER): Payer: Medicare Other | Admitting: Internal Medicine

## 2021-10-21 VITALS — BP 110/70 | HR 69 | Temp 97.9°F | Ht 66.2 in | Wt 217.8 lb

## 2021-10-21 DIAGNOSIS — E782 Mixed hyperlipidemia: Secondary | ICD-10-CM | POA: Diagnosis not present

## 2021-10-21 DIAGNOSIS — R0982 Postnasal drip: Secondary | ICD-10-CM

## 2021-10-21 DIAGNOSIS — R002 Palpitations: Secondary | ICD-10-CM

## 2021-10-21 DIAGNOSIS — J309 Allergic rhinitis, unspecified: Secondary | ICD-10-CM

## 2021-10-21 DIAGNOSIS — E7849 Other hyperlipidemia: Secondary | ICD-10-CM

## 2021-10-21 DIAGNOSIS — E538 Deficiency of other specified B group vitamins: Secondary | ICD-10-CM

## 2021-10-21 DIAGNOSIS — Z0000 Encounter for general adult medical examination without abnormal findings: Secondary | ICD-10-CM | POA: Diagnosis not present

## 2021-10-21 MED ORDER — FEXOFENADINE HCL 180 MG PO TABS: 180.0000 mg | ORAL_TABLET | Freq: Every day | ORAL | 2 refills | Status: DC

## 2021-10-21 MED ORDER — MONTELUKAST SODIUM 10 MG PO TABS: 10.0000 mg | ORAL_TABLET | Freq: Every day | ORAL | 0 refills | Status: DC

## 2021-10-21 MED ORDER — CYANOCOBALAMIN 1000 MCG/ML IJ SOLN
1000.0000 ug | Freq: Once | INTRAMUSCULAR | Status: AC
  Administered 2021-10-21: 1000 ug via INTRAMUSCULAR

## 2021-10-21 NOTE — Progress Notes (Signed)
Subjective:   Grace [name] is a 61 y.o. female who presents for Medicare Annual (Subsequent) preventive examination.  Review of Systems     Cardiac Risk Factors include: dyslipidemia;obesity (BMI >30kg/m2)     Objective:    Today's Vitals   10/21/21 1430 10/21/21 1438  BP: 110/70   Pulse: 69   Temp: 97.9 F (36.6 C)   TempSrc: Oral   SpO2: 96%   Weight: 217 lb 12.8 oz (98.8 kg)   Height: 5' 6.2" (1.681 m)   PainSc:  4    Body mass index is 34.94 kg/m.     10/21/2021    2:45 PM 10/16/2020    3:04 PM 10/10/2019    2:49 PM 03/13/2019   10:58 AM 04/27/2018   10:59 AM 06/30/2016   12:25 PM 02/12/2016   11:05 AM  Advanced Directives  Does Patient Have a Medical Advance Directive? No No No No No No No  Would patient like information on creating a medical advance directive? No - Patient declined No - Patient declined Yes (MAU/Ambulatory/Procedural Areas - Information given)  Yes (MAU/Ambulatory/Procedural Areas - Information given) Yes (MAU/Ambulatory/Procedural Areas - Information given) No - patient declined information    Current Medications (verified) Outpatient Encounter Medications as of 10/21/2021  Medication Sig   Ascorbic Acid (VITAMIN C) 1000 MG tablet Take 1,000 mg by mouth daily.   aspirin EC 81 MG tablet Take 81 mg by mouth daily.   AZO-CRANBERRY PO Take 1 capsule by mouth daily at 12 noon.   baclofen (LIORESAL) 10 MG tablet TAKE 2 TABLETS BY MOUTH 3 TIMES DAILY.   BLACK ELDERBERRY PO Take 300 mg by mouth in the morning and at bedtime.   Cholecalciferol (VITAMIN D) 50 MCG (2000 UT) CAPS Take 2,000 Units by mouth daily.    CINNAMON PO Take 1,200 mg by mouth daily.    diphenhydrAMINE (BENADRYL) 25 MG tablet Take 25 mg by mouth 3 (three) times daily. Takes with Hydrocodone   doxepin (SINEQUAN) 10 MG capsule Take 1-2 capsules (10-20 mg total) by mouth at bedtime.   EPINEPHrine 0.3 mg/0.3 mL IJ SOAJ injection Use as directed for life-threatening allergic reaction.    famotidine (PEPCID) 20 MG tablet TAKE 1 TABLET BY MOUTH TWICE A DAY   Fexofenadine-Pseudoephedrine (ALLEGRA-D PO) Take by mouth in the morning.   Flaxseed, Linseed, (FLAX SEED OIL) 1000 MG CAPS Take 1,200 mg by mouth daily.    gabapentin (NEURONTIN) 300 MG capsule Take 1 capsule (300 mg total) by mouth 3 (three) times daily.   HYDROcodone-acetaminophen (NORCO/VICODIN) 5-325 MG tablet Take 1 tablet by mouth 3 (three) times daily as needed for moderate pain.   ipratropium (ATROVENT) 0.06 % nasal spray Use 2 sprays each nostril bid prn drainage   L-Lysine 1000 MG TABS Take by mouth. As needed   Lactobacillus (PROBIOTIC ACIDOPHILUS PO) Take by mouth daily. Digestive Advantage BC   bacillus coagulans   levocetirizine (XYZAL) 5 MG tablet TAKE 1 TABLET BY MOUTH EVERY DAY IN THE EVENING   LORazepam (ATIVAN) 1 MG tablet Take 1 tablet (1 mg total) by mouth at bedtime.   Lutein-Bilberry (BILBERRY PLUS LUTEIN PO) Take by mouth daily.   Melatonin 10 MG TABS Take 1 tablet by mouth at bedtime.    methylphenidate (CONCERTA) 27 MG PO CR tablet Take 1 tablet (27 mg total) by mouth every morning.   Moringa 500 MG CAPS Take 2 capsules by mouth daily.   Multiple Vitamins-Minerals (HAIR/SKIN/NAILS/BIOTIN) TABS Take by mouth  daily.    Nutritional Supplements (NUTRITIONAL SUPPLEMENT PO) Take 2.1 g by mouth in the morning and at bedtime. LION'S MANE   nystatin-triamcinolone ointment (MYCOLOG) Apply 1 application topically 2 (two) times daily.   rizatriptan (MAXALT-MLT) 10 MG disintegrating tablet Take 1 tablet (10 mg total) by mouth as needed for migraine. May repeat in 2 hours if needed.  Max of 2 tablets in 24 hours, and 10 tablets per month   tiZANidine (ZANAFLEX) 2 MG tablet TAKE UP TO 4 PILLS A DAY   Vitamin D, Ergocalciferol, (DRISDOL) 1.25 MG (50000 UNIT) CAPS capsule ONE CAPSULE TWICE WEEKLY ON TUESDAYS/FRIDAYS   vitamin E 1000 UNIT capsule vitamin E 670 mg (1,000 unit) capsule   1 capsule every day by oral  route.   zolpidem (AMBIEN) 10 MG tablet Take 10 mg by mouth at bedtime as needed. For sleep   fluconazole (DIFLUCAN) 150 MG tablet Take one tablet by mouth at the onset of symptoms then repeat in 48 hours (Patient not taking: Reported on 10/21/2021)   promethazine-dextromethorphan (PROMETHAZINE-DM) 6.25-15 MG/5ML syrup Take 5 mLs by mouth 4 (four) times daily as needed for cough.   No facility-administered encounter medications on file as of 10/21/2021.    Allergies (verified) Diclofenac, Lamictal [lamotrigine], Mobic [meloxicam], Ultram [tramadol hcl], Nefazodone, Betaseron [interferon beta-1b], Codeine, Copaxone [glatiramer acetate], Gabapentin, Latex, Nitrofurantoin, Oxcarbazepine, Teriflunomide, Trazodone and nefazodone, and Tape   History: Past Medical History:  Diagnosis Date   Allergy    Anxiety    Avascular necrosis (HCC)    Back pain    Cancer (HCC)    Chest pain    Chewing difficulty    Chronic fatigue syndrome    Colon polyp    pre-cancerous   Complication of anesthesia    patient states she requires more anesthesia   Depression    Disorder of soft tissue    Diverticulosis    Dizziness    Edema, lower extremity    Fatty liver    Fibromyalgia    GERD (gastroesophageal reflux disease)    H/O breast biopsy    pre- cancerous cells   Hand fracture, left    9/87   Heart palpitations    Heart valve disease    History of Holter monitoring    Hyperlipidemia    Insomnia    Joint pain    Lactose intolerance    Lhermitte's syndrome    Migraines    Multiple food allergies    Multiple sclerosis (HCC)    dx in 11/1996   Multiple sclerosis (HCC)    Neuromuscular disorder (HCC)    Neuropathy    Numbness and tingling in hands    Obesity    Optic neuritis    OSA (obstructive sleep apnea)    no longer have OSA since stopped taking a MS medication   Osteoarthritis    Otitis media    Palpitations    Prediabetes    Raynaud's phenomenon    Shortness of breath    Sleep  apnea    Due to medication that she no longer takes.   Swallowing difficulty    Vaginitis and vulvovaginitis    Vitamin B12 deficiency    Vitamin D deficiency    Past Surgical History:  Procedure Laterality Date   ABDOMINAL HYSTERECTOMY Bilateral 02/12/2016   Procedure: HYSTERECTOMY ABDOMINAL WITH BILATERAL SALPINGO OOPHERECTOMY;  Surgeon: Dian Queen, MD;  Location: [facility];  Service: Gynecology;  Laterality: Bilateral;   BREAST EXCISIONAL BIOPSY  core/left breast   CATARACT EXTRACTION, BILATERAL  06/2019   CHOLECYSTECTOMY  1983   COLONOSCOPY     1997/2001   DILATION AND CURETTAGE OF UTERUS     ENDOMETRIAL ABLATION  2012   LAPAROSCOPIC ABDOMINAL EXPLORATION     ovaries and intestines bond together   LEFT HEART CATHETERIZATION WITH CORONARY ANGIOGRAM N/A 06/21/2011   Procedure: LEFT HEART CATHETERIZATION WITH CORONARY ANGIOGRAM;  Surgeon: Leonie Man, MD;  Location[facility];  Service: Cardiovascular;  Laterality: N/A;   LUMBAR PUNCTURE     11/01/1996   SALPINGOOPHORECTOMY Bilateral 02/12/2016   Procedure: BILATERAL SALPINGO OOPHORECTOMY;  Surgeon: Dian Queen, MD;  Location: [facility];  Service: Gynecology;  Laterality: Bilateral;   TOTAL HIP ARTHROPLASTY     left hip  6/02  /right hip12/04   Family History  Problem Relation Age of Onset   Pancreatic cancer Mother    Stroke Mother    Hypertension Mother    Hyperlipidemia Mother    Heart disease Mother    Cancer Mother    Depression Mother    Anxiety disorder Mother    Alcoholism Mother    Testicular cancer Brother    Colon cancer Maternal Uncle    Pancreatic cancer Paternal Uncle    Prostate cancer Maternal Grandfather    Colon polyps Father    Diabetes Father    Hypertension Father    Hyperlipidemia Father    Kidney disease Father    Sleep apnea Father    Obesity Father    Social History   Socioeconomic History   Marital status: Widowed    Spouse name: Not on file   Number of children: Not on  file   Years of education: Not on file   Highest education level: Not on file  Occupational History   Occupation: disabled  Tobacco Use   Smoking status: Former    Packs/day: 0.25    Years: 8.00    Total pack years: 2.00    Types: Cigarettes    Quit date: 07/01/1995    Years since quitting: 26.3   Smokeless tobacco: Never  Vaping Use   Vaping Use: Never used  Substance and Sexual Activity   Alcohol use: No    Alcohol/week: 0.0 standard drinks of alcohol   Drug use: Yes    Types: Hydrocodone   Sexual activity: Not Currently  Other Topics Concern   Not on file  Social History Narrative   Not on file   Social Determinants of Health   Financial Resource Strain: Low Risk  (10/21/2021)   Overall Financial Resource Strain (CARDIA)    Difficulty of Paying Living Expenses: Not hard at all  Food Insecurity: No Food Insecurity (10/21/2021)   Hunger Vital Sign    Worried About Running Out of Food in the Last Year: Never true    Ran Out of Food in the Last Year: Never true  Transportation Needs: No Transportation Needs (10/21/2021)   PRAPARE - Hydrologist (Medical): No    Lack of Transportation (Non-Medical): No  Physical Activity: Sufficiently Active (10/21/2021)   Exercise Vital Sign    Days of Exercise per Week: 7 days    Minutes of Exercise per Session: 40 min  Stress: No Stress Concern Present (10/21/2021)   [place]    Feeling of Stress : Not at all  Social Connections: Not on file    Tobacco Counseling Counseling given: Not Answered  Clinical Intake:  Pre-visit preparation completed: Yes  Pain : 0-10 Pain Score: 4  Pain Type: Chronic pain Pain Location: Back Pain Orientation: Lower Pain Descriptors / Indicators: Aching Pain Onset: More than a month ago Pain Frequency: Constant     Nutritional Status: BMI > 30  Obese Nutritional Risks: None Diabetes: No  How  often do you need to have someone help you when you read instructions, pamphlets, or other written materials from your doctor or pharmacy?: 1 - Never What is the last grade level you completed in school?: technical  Diabetic? no  Interpreter Needed?: No  Information entered by :: NAllen LPN   Activities of Daily Living    10/21/2021    2:46 PM  In your present state of health, do you have any difficulty performing the following activities:  Hearing? 0  Vision? 0  Difficulty concentrating or making decisions? 1  Walking or climbing stairs? 0  Dressing or bathing? 0  Doing errands, shopping? 0  Preparing Food and eating ? N  Using the Toilet? N  In the past six months, have you accidently leaked urine? Y  Do you have problems with loss of bowel control? Y  Managing your Medications? N  Managing your Finances? N  Housekeeping or managing your Housekeeping? N    Patient Care Team: [name], MD as PCP - General (Internal Medicine) Sater, Nanine Means, MD (Neurology) Mayford Knife, [facility] (Pharmacist) Rex Kras Claudette Stapler, RN as [place] any recent Medical Services you may have received from other than [facility] providers in the past year (date may be approximate).     Assessment:   This is a routine wellness examination for Jaycey.  Hearing/Vision screen Vision Screening - Comments:: Regular eye exams, [place] issues and exercise activities discussed: Current Exercise Habits: Home exercise routine, Type of exercise: walking, Time (Minutes): 40, Frequency (Times/Week): 7, Weekly Exercise (Minutes/Week): 280   Goals Addressed             This Visit's Progress    Patient Stated       10/21/2021, wants to lose weight       Depression Screen    10/21/2021    2:46 PM 05/20/2021   12:43 PM 04/28/2021    9:17 AM 10/16/2020    3:05 PM 10/16/2020    2:48 PM 10/16/2020    2:16 PM 04/16/2020    3:39 PM  PHQ 2/9 Scores   PHQ - 2 Score 0 '2 4  3  2  '$ PHQ- 9 Score  '9 17  14  9  '$ Exception Documentation    Other- indicate reason in comment box  Medical reason   Not completed    completed by MD       Fall Risk    10/21/2021    2:45 PM 10/16/2020    3:05 PM 10/10/2019    2:51 PM 04/11/2019    2:35 PM 03/13/2019   10:59 AM  Fall Risk   Falls in the past year? 0 0 1 1 0  Comment   clumsy, has MS    Number falls in past yr: 0   0 0  Injury with Fall? 0   1   Comment    brusing   Risk for fall due to : Medication side effect Medication side effect History of fall(s);Medication side effect  Medication side effect  Follow up Falls evaluation completed;Education provided;Falls prevention discussed Falls evaluation completed;Education  provided;Falls prevention discussed Falls evaluation completed;Education provided;Falls prevention discussed  Falls evaluation completed;Education provided;Falls prevention discussed    FALL RISK PREVENTION PERTAINING TO THE HOME:  Any stairs in or around the home? No  If so, are there any without handrails?  N/a Home free of loose throw rugs in walkways, pet beds, electrical cords, etc? Yes  Adequate lighting in your home to reduce risk of falls? Yes   ASSISTIVE DEVICES UTILIZED TO PREVENT FALLS:  Life alert? No  Use of a cane, walker or w/c? No  Grab bars in the bathroom? No  Shower chair or bench in shower? Yes  Elevated toilet seat or a handicapped toilet? Yes   TIMED UP AND GO:  Was the test performed? No .    Gait steady and fast without use of assistive device  Cognitive Function:        10/21/2021    2:47 PM 10/16/2020    3:07 PM 10/10/2019    2:55 PM 03/13/2019   11:02 AM 04/27/2018   11:08 AM  6CIT Screen  What Year? 0 points 0 points 0 points 0 points 0 points  What month? 0 points 0 points 0 points 0 points 0 points  What time? 0 points 0 points 0 points 0 points 0 points  Count back from 20 0 points 0 points 0 points 0 points 0 points  Months in reverse 0  points 2 points 2 points 2 points 2 points  Repeat phrase 6 points 2 points 4 points 6 points 2 points  Total Score 6 points 4 points 6 points 8 points 4 points    Immunizations Immunization History  Administered Date(s) Administered   Tdap 03/21/2012, 04/16/2020    TDAP status: Up to date  Flu Vaccine status: Declined, Education has been provided regarding the importance of this vaccine but patient still declined. Advised may receive this vaccine at local pharmacy or Health Dept. Aware to provide a copy of the vaccination record if obtained from local pharmacy or Health Dept. Verbalized acceptance and understanding.  Pneumococcal vaccine status: Up to date  Covid-19 vaccine status: Declined, Education has been provided regarding the importance of this vaccine but patient still declined. Advised may receive this vaccine at local pharmacy or Health Dept.or vaccine clinic. Aware to provide a copy of the vaccination record if obtained from local pharmacy or Health Dept. Verbalized acceptance and understanding.  Qualifies for Shingles Vaccine? Yes   Zostavax completed No   Shingrix Completed?: No.    Education has been provided regarding the importance of this vaccine. Patient has been advised to call insurance company to determine out of pocket expense if they have not yet received this vaccine. Advised may also receive vaccine at local pharmacy or Health Dept. Verbalized acceptance and understanding.  Screening Tests Health Maintenance  Topic Date Due   COVID-19 Vaccine (1) 11/06/2021 (Originally 10/24/1961)   Zoster Vaccines- Shingrix (1 of 2) 01/21/2022 (Originally 04/24/1982)   HIV Screening  04/28/2022 (Originally 04/24/1978)   INFLUENZA VACCINE  12/08/2021   COLONOSCOPY (Pts 45-40yr Insurance coverage will need to be confirmed)  05/12/2023   MAMMOGRAM  08/29/2023   TETANUS/TDAP  04/16/2030   Hepatitis C Screening  Completed   HPV VACCINES  Aged Out   PAP SMEAR-Modifier   Discontinued    Health Maintenance  There are no preventive care reminders to display for this patient.   Colorectal cancer screening: Type of screening: Colonoscopy. Completed 05/29/2018. Repeat every 5 years  Mammogram status: Completed  08/28/2021. Repeat every year  Bone Density status: n/a  Lung Cancer Screening: (Low Dose CT Chest recommended if Age 36-80 years, 30 pack-year currently smoking OR have quit w/in 15years.) does not qualify.   Lung Cancer Screening Referral: no  Additional Screening:  Hepatitis C Screening: does qualify; Completed 10/20/2005  Vision Screening: Recommended annual ophthalmology exams for early detection of glaucoma and other disorders of the eye. Is the patient up to date with their annual eye exam?  Yes  Who is the provider or what is the name of the office in which the patient attends annual eye exams? [facility] If pt is not established with a provider, would they like to be referred to a provider to establish care? No .   Dental Screening: Recommended annual dental exams for proper oral hygiene  Community Resource Referral / Chronic Care Management: CRR required this visit?  No   CCM required this visit?  No      Plan:     I have personally reviewed and noted the following in the patient's chart:   Medical and social history Use of alcohol, tobacco or illicit drugs  Current medications and supplements including opioid prescriptions.  Functional ability and status Nutritional status Physical activity Advanced directives List of other physicians Hospitalizations, surgeries, and ER visits in previous 12 months Vitals Screenings to include cognitive, depression, and falls Referrals and appointments  In addition, I have reviewed and discussed with patient certain preventive protocols, quality metrics, and best practice recommendations. A written personalized care plan for preventive services as well as general preventive health  recommendations were provided to patient.     Kellie Simmering, LPN   07/17/4074   Nurse Notes: none

## 2021-10-21 NOTE — Patient Instructions (Signed)
Magnesium glycinate capsules, one nightly

## 2021-10-21 NOTE — Progress Notes (Signed)
[name],acting as a Education administrator for [name], MD.,have documented all relevant documentation on the behalf of [name], MD,as directed by  [name], MD while in the presence of [name], MD.  This visit occurred during the SARS-CoV-2 public health emergency.  Safety protocols were in place, including screening questions prior to the visit, additional usage of staff PPE, and extensive cleaning of exam room while observing appropriate contact time as indicated for disinfecting solutions.  Subjective:     Patient ID: Grace [name] , female    DOB: 1961-04-19 , 61 y.o.   MRN: [id number]   No chief complaint on file.   HPI  She presents today for evaluation of cough/cold symptoms. She has been taking allergy meds, but still has cough and sinus drainage. She denies fever/chills.   Additionally, she is still having palpitaitons. She has been seen by Cardiology. She has had essentially normal echo as well.   She was also seen by [facility] Advisor today for AWV.      Past Medical History:  Diagnosis Date   Allergy    Anxiety    Avascular necrosis (HCC)    Back pain    Cancer (HCC)    Chest pain    Chewing difficulty    Chronic fatigue syndrome    Colon polyp    pre-cancerous   Complication of anesthesia    patient states she requires more anesthesia   Depression    Disorder of soft tissue    Diverticulosis    Dizziness    Edema, lower extremity    Fatty liver    Fibromyalgia    GERD (gastroesophageal reflux disease)    H/O breast biopsy    pre- cancerous cells   Hand fracture, left    9/87   Heart palpitations    Heart valve disease    History of Holter monitoring    Hyperlipidemia    Insomnia    Joint pain    Lactose intolerance    Lhermitte's syndrome    Migraines    Multiple food allergies    Multiple sclerosis (HCC)    dx in 11/1996   Multiple sclerosis (HCC)    Neuromuscular disorder (HCC)    Neuropathy    Numbness and  tingling in hands    Obesity    Optic neuritis    OSA (obstructive sleep apnea)    no longer have OSA since stopped taking a MS medication   Osteoarthritis    Otitis media    Palpitations    Prediabetes    Raynaud's phenomenon    Shortness of breath    Sleep apnea    Due to medication that she no longer takes.   Swallowing difficulty    Vaginitis and vulvovaginitis    Vitamin B12 deficiency    Vitamin D deficiency      Family History  Problem Relation Age of Onset   Pancreatic cancer Mother    Stroke Mother    Hypertension Mother    Hyperlipidemia Mother    Heart disease Mother    Cancer Mother    Depression Mother    Anxiety disorder Mother    Alcoholism Mother    Testicular cancer Brother    Colon cancer Maternal Uncle    Pancreatic cancer Paternal Uncle    Prostate cancer Maternal Grandfather    Colon polyps Father    Diabetes Father    Hypertension Father    Hyperlipidemia Father  Kidney disease Father    Sleep apnea Father    Obesity Father      Current Outpatient Medications:    fexofenadine (ALLEGRA) 180 MG tablet, Take 1 tablet (180 mg total) by mouth daily., Disp: 90 tablet, Rfl: 2   montelukast (SINGULAIR) 10 MG tablet, Take 1 tablet (10 mg total) by mouth daily., Disp: 90 tablet, Rfl: 0   ADVANCED EVENING PRIMROSE OIL PO, Take 1 capsule by mouth daily. Total 1300 mg, Disp: , Rfl:    Ascorbic Acid (VITAMIN C) 1000 MG tablet, Take 1,000 mg by mouth daily., Disp: , Rfl:    aspirin EC 81 MG tablet, Take 81 mg by mouth daily., Disp: , Rfl:    AZO-CRANBERRY PO, Take 1 capsule by mouth daily at 12 noon., Disp: , Rfl:    baclofen (LIORESAL) 10 MG tablet, TAKE 2 TABLETS BY MOUTH 3 TIMES DAILY., Disp: 540 tablet, Rfl: 3   BLACK ELDERBERRY PO, Take 300 mg by mouth in the morning and at bedtime., Disp: , Rfl:    Cholecalciferol (VITAMIN D) 50 MCG (2000 UT) CAPS, Take 2,000 Units by mouth daily. , Disp: , Rfl:    CINNAMON PO, Take 1,200 mg by mouth daily. , Disp:  , Rfl:    diphenhydrAMINE (BENADRYL) 25 MG tablet, Take 25 mg by mouth 3 (three) times daily. Takes with Hydrocodone, Disp: , Rfl:    doxepin (SINEQUAN) 10 MG capsule, Take 1-2 capsules (10-20 mg total) by mouth at bedtime., Disp: 180 capsule, Rfl: 1   EPINEPHrine 0.3 mg/0.3 mL IJ SOAJ injection, Use as directed for life-threatening allergic reaction., Disp: 1 each, Rfl: 3   famotidine (PEPCID) 20 MG tablet, TAKE 1 TABLET BY MOUTH TWICE A DAY, Disp: 180 tablet, Rfl: 1   Flaxseed, Linseed, (FLAX SEED OIL) 1000 MG CAPS, Take 1,200 mg by mouth daily. , Disp: , Rfl:    gabapentin (NEURONTIN) 300 MG capsule, Take 1 capsule (300 mg total) by mouth 3 (three) times daily., Disp: 270 capsule, Rfl: 1   HYDROcodone-acetaminophen (NORCO/VICODIN) 5-325 MG tablet, Take 1 tablet by mouth 3 (three) times daily as needed for moderate pain., Disp: 270 tablet, Rfl: 0   ipratropium (ATROVENT) 0.06 % nasal spray, Use 2 sprays each nostril bid prn drainage, Disp: 30 mL, Rfl: 5   L-Lysine 1000 MG TABS, Take by mouth. As needed, Disp: , Rfl:    Lactobacillus (PROBIOTIC ACIDOPHILUS PO), Take by mouth daily. Digestive Advantage BC   bacillus coagulans, Disp: , Rfl:    levocetirizine (XYZAL) 5 MG tablet, TAKE 1 TABLET BY MOUTH EVERY DAY IN THE EVENING, Disp: 90 tablet, Rfl: 2   LORazepam (ATIVAN) 1 MG tablet, Take 1 tablet (1 mg total) by mouth at bedtime., Disp: 30 tablet, Rfl: 5   Lutein-Bilberry (BILBERRY PLUS LUTEIN PO), Take by mouth daily., Disp: , Rfl:    Melatonin 10 MG TABS, Take 1 tablet by mouth at bedtime. , Disp: , Rfl:    methylphenidate (CONCERTA) 27 MG PO CR tablet, Take 1 tablet (27 mg total) by mouth every morning., Disp: 90 tablet, Rfl: 0   Moringa 500 MG CAPS, Take 2 capsules by mouth daily., Disp: , Rfl:    Multiple Vitamins-Minerals (HAIR/SKIN/NAILS/BIOTIN) TABS, Take by mouth daily. , Disp: , Rfl:    Nutritional Supplements (NUTRITIONAL SUPPLEMENT PO), Take 2.1 g by mouth in the morning and at bedtime.  LION'S MANE, Disp: , Rfl:    nystatin-triamcinolone ointment (MYCOLOG), Apply 1 application topically 2 (two) times daily., Disp:  30 g, Rfl: 2   rizatriptan (MAXALT-MLT) 10 MG disintegrating tablet, Take 1 tablet (10 mg total) by mouth as needed for migraine. May repeat in 2 hours if needed.  Max of 2 tablets in 24 hours, and 10 tablets per month, Disp: 10 tablet, Rfl: 11   tiZANidine (ZANAFLEX) 2 MG tablet, TAKE UP TO 4 TABLETS BY MOUTH EVERY DAY, Disp: 360 tablet, Rfl: 1   Vitamin D, Ergocalciferol, (DRISDOL) 1.25 MG (50000 UNIT) CAPS capsule, ONE CAPSULE TWICE WEEKLY ON TUESDAYS/FRIDAYS, Disp: 24 capsule, Rfl: 2   vitamin E 1000 UNIT capsule, vitamin E 670 mg (1,000 unit) capsule   1 capsule every day by oral route., Disp: , Rfl:    zolpidem (AMBIEN) 10 MG tablet, Take 10 mg by mouth at bedtime as needed. For sleep, Disp: , Rfl:    Allergies  Allergen Reactions   Diclofenac Anaphylaxis   Lamictal [Lamotrigine] Shortness Of Breath, Itching and Swelling    Tongue swells   Mobic [Meloxicam] Anaphylaxis   Ultram [Tramadol Hcl] Anaphylaxis   Nefazodone Itching and Swelling    Other reaction(s): Arthralgia (Joint Pain); Throat swelling, mouth sores   Betaseron [Interferon Beta-1b] Other (See Comments)    Caused avascular necrosis   Codeine Itching   Copaxone [Glatiramer Acetate] Other (See Comments)    Altered mental status: mental changes, anxiety, motion sickness   Gabapentin Itching    Itching if she takes more than '300mg'$    Latex     Latex band-aids cause redness and tears your skin   Nitrofurantoin     Causing itching all over, tongue swelling, chest heaviness, swollen lips, cough, rapid HR, swollen eyelids, red eyes/gums, lips red/white per pt.   Oxcarbazepine Itching and Other (See Comments)    Dizziness, throat hurts   Teriflunomide Diarrhea    Hair loss, joint pains, elevated liver enzymes abugio (joint pains and liver issues)   Trazodone And Nefazodone     Mouth sores, tongue  swelling   Tape Rash     Review of Systems  Constitutional: Negative.   HENT:  Positive for postnasal drip and rhinorrhea.   Respiratory: Negative.    Cardiovascular:  Positive for palpitations.  Gastrointestinal: Negative.   Neurological: Negative.   Psychiatric/Behavioral: Negative.       Today's Vitals   10/21/21 1447  BP: 110/70  Pulse: 69  Temp: 97.9 F (36.6 C)  Weight: 217 lb 13 oz (98.8 kg)  Height: 5' 6.2" (1.681 m)   Body mass index is 34.94 kg/m.   Wt Readings from Last 3 Encounters:  10/21/21 217 lb 13 oz (98.8 kg)  10/21/21 217 lb 12.8 oz (98.8 kg)  09/10/21 217 lb (98.4 kg)     Objective:  Physical Exam Vitals and nursing note reviewed.  Constitutional:      Appearance: Normal appearance.  HENT:     Head: Normocephalic and atraumatic.     Right Ear: Tympanic membrane, ear canal and external ear normal. There is no impacted cerumen.     Left Ear: Tympanic membrane, ear canal and external ear normal. There is no impacted cerumen.     Mouth/Throat:     Pharynx: No oropharyngeal exudate.  Eyes:     Extraocular Movements: Extraocular movements intact.  Cardiovascular:     Rate and Rhythm: Normal rate and regular rhythm.     Heart sounds: Normal heart sounds.  Pulmonary:     Effort: Pulmonary effort is normal.     Breath sounds: Normal breath sounds.  Skin:    General: Skin is warm.  Neurological:     General: No focal deficit present.     Mental Status: She is alert.  Psychiatric:        Mood and Affect: Mood normal.        Behavior: Behavior normal.       Assessment And Plan:     1. Allergic rhinitis with postnasal drip Comments: I will send rx fexofenadine and montelukast daily. She will let me know if her sx persist. She will let me know if her sx persist.   2. Palpitations Comments: Chronic, she is encouraged to stay well hydrated. Cardiology input appreciated. She may benefit from continued Mg supplementation.  3. Vitamin B 12  deficiency Comments: I will check vitamin B12 level today, she was also given vitamin B!2 IM x 1.  - Vitamin B12 - cyanocobalamin ((VITAMIN B-12)) injection 1,000 mcg  4. Mixed hyperlipidemia Comments: She is encouraged to follow heart healthy lifestyle and to avoid fried foods, trans-fats and to decrease her sugar intake.  - Lipid panel - Liver Profile   Patient was given opportunity to ask questions. Patient verbalized understanding of the plan and was able to repeat key elements of the plan. All questions were answered to their satisfaction.   I, [name], MD, have reviewed all documentation for this visit. The documentation on 10/21/21 for the exam, diagnosis, procedures, and orders are all accurate and complete.   IF YOU HAVE BEEN REFERRED TO A SPECIALIST, IT MAY TAKE 1-2 WEEKS TO SCHEDULE/PROCESS THE REFERRAL. IF YOU HAVE NOT HEARD FROM US/SPECIALIST IN TWO WEEKS, PLEASE GIVE [place] A CALL AT [phone] X [phone].   THE PATIENT IS ENCOURAGED TO PRACTICE SOCIAL DISTANCING DUE TO THE COVID-19 PANDEMIC.

## 2021-10-21 NOTE — Patient Instructions (Signed)
Ms. Flener , Thank you for taking time to come for your Medicare Wellness Visit. I appreciate your ongoing commitment to your health goals. Please review the following plan we discussed and let me know if I can assist you in the future.   Screening recommendations/referrals: Colonoscopy: completed 05/11/2018, due 05/12/2023 Mammogram: completed 08/28/2021 Bone Density: n/a Recommended yearly ophthalmology/optometry visit for glaucoma screening and checkup Recommended yearly dental visit for hygiene and checkup  Vaccinations: Influenza vaccine: decline Pneumococcal vaccine: n/a Tdap vaccine: completed 04/16/2020, due 04/16/2030 Shingles vaccine: discussed  Covid-19:  decline  Advanced directives: Advance directive discussed with you today. Even though you declined this today please call our office should you change your mind and we can give you the proper paperwork for you to fill out.  Conditions/risks identified: none  Next appointment: Follow up in one year for your annual wellness visit.   Preventive Care 40-64 Years, Female Preventive care refers to lifestyle choices and visits with your health care provider that can promote health and wellness. What does preventive care include? A yearly physical exam. This is also called an annual well check. Dental exams once or twice a year. Routine eye exams. Ask your health care provider how often you should have your eyes checked. Personal lifestyle choices, including: Daily care of your teeth and gums. Regular physical activity. Eating a healthy diet. Avoiding tobacco and drug use. Limiting alcohol use. Practicing safe sex. Taking low-dose aspirin daily starting at age 12. Taking vitamin and mineral supplements as recommended by your health care provider. What happens during an annual well check? The services and screenings done by your health care provider during your annual well check will depend on your age, overall health, lifestyle  risk factors, and family history of disease. Counseling  Your health care provider may ask you questions about your: Alcohol use. Tobacco use. Drug use. Emotional well-being. Home and relationship well-being. Sexual activity. Eating habits. Work and work Statistician. Method of birth control. Menstrual cycle. Pregnancy history. Screening  You may have the following tests or measurements: Height, weight, and BMI. Blood pressure. Lipid and cholesterol levels. These may be checked every 5 years, or more frequently if you are over 62 years old. Skin check. Lung cancer screening. You may have this screening every year starting at age 41 if you have a 30-pack-year history of smoking and currently smoke or have quit within the past 15 years. Fecal occult blood test (FOBT) of the stool. You may have this test every year starting at age 9. Flexible sigmoidoscopy or colonoscopy. You may have a sigmoidoscopy every 5 years or a colonoscopy every 10 years starting at age 56. Hepatitis C blood test. Hepatitis B blood test. Sexually transmitted disease (STD) testing. Diabetes screening. This is done by checking your blood sugar (glucose) after you have not eaten for a while (fasting). You may have this done every 1-3 years. Mammogram. This may be done every 1-2 years. Talk to your health care provider about when you should start having regular mammograms. This may depend on whether you have a family history of breast cancer. BRCA-related cancer screening. This may be done if you have a family history of breast, ovarian, tubal, or peritoneal cancers. Pelvic exam and Pap test. This may be done every 3 years starting at age 46. Starting at age 71, this may be done every 5 years if you have a Pap test in combination with an HPV test. Bone density scan. This is done to screen for  osteoporosis. You may have this scan if you are at high risk for osteoporosis. Discuss your test results, treatment options,  and if necessary, the need for more tests with your health care provider. Vaccines  Your health care provider may recommend certain vaccines, such as: Influenza vaccine. This is recommended every year. Tetanus, diphtheria, and acellular pertussis (Tdap, Td) vaccine. You may need a Td booster every 10 years. Zoster vaccine. You may need this after age 83. Pneumococcal 13-valent conjugate (PCV13) vaccine. You may need this if you have certain conditions and were not previously vaccinated. Pneumococcal polysaccharide (PPSV23) vaccine. You may need one or two doses if you smoke cigarettes or if you have certain conditions. Talk to your health care provider about which screenings and vaccines you need and how often you need them. This information is not intended to replace advice given to you by your health care provider. Make sure you discuss any questions you have with your health care provider. Document Released: 05/23/2015 Document Revised: 01/14/2016 Document Reviewed: 02/25/2015 Elsevier Interactive Patient Education  2017 [employer] Prevention in the Home Falls can cause injuries. They can happen to people of all ages. There are many things you can do to make your home safe and to help prevent falls. What can I do on the outside of my home? Regularly fix the edges of walkways and driveways and fix any cracks. Remove anything that might make you trip as you walk through a door, such as a raised step or threshold. Trim any bushes or trees on the path to your home. Use bright outdoor lighting. Clear any walking paths of anything that might make someone trip, such as rocks or tools. Regularly check to see if handrails are loose or broken. Make sure that both sides of any steps have handrails. Any raised decks and porches should have guardrails on the edges. Have any leaves, snow, or ice cleared regularly. Use sand or salt on walking paths during winter. Clean up any spills in  your garage right away. This includes oil or grease spills. What can I do in the bathroom? Use night lights. Install grab bars by the toilet and in the tub and shower. Do not use towel bars as grab bars. Use non-skid mats or decals in the tub or shower. If you need to sit down in the shower, use a plastic, non-slip stool. Keep the floor dry. Clean up any water that spills on the floor as soon as it happens. Remove soap buildup in the tub or shower regularly. Attach bath mats securely with double-sided non-slip rug tape. Do not have throw rugs and other things on the floor that can make you trip. What can I do in the bedroom? Use night lights. Make sure that you have a light by your bed that is easy to reach. Do not use any sheets or blankets that are too big for your bed. They should not hang down onto the floor. Have a firm chair that has side arms. You can use this for support while you get dressed. Do not have throw rugs and other things on the floor that can make you trip. What can I do in the kitchen? Clean up any spills right away. Avoid walking on wet floors. Keep items that you use a lot in easy-to-reach places. If you need to reach something above you, use a strong step stool that has a grab bar. Keep electrical cords out of the way. Do not  use floor polish or wax that makes floors slippery. If you must use wax, use non-skid floor wax. Do not have throw rugs and other things on the floor that can make you trip. What can I do with my stairs? Do not leave any items on the stairs. Make sure that there are handrails on both sides of the stairs and use them. Fix handrails that are broken or loose. Make sure that handrails are as long as the stairways. Check any carpeting to make sure that it is firmly attached to the stairs. Fix any carpet that is loose or worn. Avoid having throw rugs at the top or bottom of the stairs. If you do have throw rugs, attach them to the floor with carpet  tape. Make sure that you have a light switch at the top of the stairs and the bottom of the stairs. If you do not have them, ask someone to add them for you. What else can I do to help prevent falls? Wear shoes that: Do not have high heels. Have rubber bottoms. Are comfortable and fit you well. Are closed at the toe. Do not wear sandals. If you use a stepladder: Make sure that it is fully opened. Do not climb a closed stepladder. Make sure that both sides of the stepladder are locked into place. Ask someone to hold it for you, if possible. Clearly mark and make sure that you can see: Any grab bars or handrails. First and last steps. Where the edge of each step is. Use tools that help you move around (mobility aids) if they are needed. These include: Canes. Walkers. Scooters. Crutches. Turn on the lights when you go into a dark area. Replace any light bulbs as soon as they burn out. Set up your furniture so you have a clear path. Avoid moving your furniture around. If any of your floors are uneven, fix them. If there are any pets around you, be aware of where they are. Review your medicines with your doctor. Some medicines can make you feel dizzy. This can increase your chance of falling. Ask your doctor what other things that you can do to help prevent falls. This information is not intended to replace advice given to you by your health care provider. Make sure you discuss any questions you have with your health care provider. Document Released: 02/20/2009 Document Revised: 10/02/2015 Document Reviewed: 05/31/2014 Elsevier Interactive Patient Education  2017 [employer].

## 2021-10-22 ENCOUNTER — Encounter: Payer: Self-pay | Admitting: Internal Medicine

## 2021-10-22 LAB — HEPATIC FUNCTION PANEL
ALT: 60 IU/L — ABNORMAL HIGH (ref 0–32)
AST: 55 IU/L — ABNORMAL HIGH (ref 0–40)
Albumin: 4.4 g/dL (ref 3.8–4.9)
Alkaline Phosphatase: 117 IU/L (ref 44–121)
Bilirubin Total: 0.4 mg/dL (ref 0.0–1.2)
Bilirubin, Direct: 0.16 mg/dL (ref 0.00–0.40)
Total Protein: 7.2 g/dL (ref 6.0–8.5)

## 2021-10-22 LAB — LIPID PANEL
Chol/HDL Ratio: 2.6 ratio (ref 0.0–4.4)
Cholesterol, Total: 172 mg/dL (ref 100–199)
HDL: 66 mg/dL (ref >39)
LDL Chol Calc (NIH): 78 mg/dL (ref 0–99)
Triglycerides: 166 mg/dL — ABNORMAL HIGH (ref 0–149)
VLDL Cholesterol Cal: 28 mg/dL (ref 5–40)

## 2021-10-22 LAB — VITAMIN B12: Vitamin B-12: 663 pg/mL (ref 232–1245)

## 2021-10-23 ENCOUNTER — Telehealth: Payer: Medicare Other

## 2021-10-24 LAB — GAMMA GT: GGT: 18 IU/L (ref 0–60)

## 2021-10-24 LAB — SPECIMEN STATUS REPORT

## 2021-10-29 ENCOUNTER — Ambulatory Visit (INDEPENDENT_AMBULATORY_CARE_PROVIDER_SITE_OTHER): Payer: Medicare Other

## 2021-10-29 ENCOUNTER — Telehealth: Payer: Medicare Other

## 2021-10-29 DIAGNOSIS — R7303 Prediabetes: Secondary | ICD-10-CM

## 2021-10-29 DIAGNOSIS — E6609 Other obesity due to excess calories: Secondary | ICD-10-CM

## 2021-10-29 DIAGNOSIS — E559 Vitamin D deficiency, unspecified: Secondary | ICD-10-CM

## 2021-10-29 DIAGNOSIS — G35 Multiple sclerosis: Secondary | ICD-10-CM

## 2021-10-29 DIAGNOSIS — F3342 Major depressive disorder, recurrent, in full remission: Secondary | ICD-10-CM

## 2021-10-29 NOTE — Patient Instructions (Signed)
Visit Information  Thank you for taking time to visit with me today. Please don't hesitate to contact me if I can be of assistance to you before our next scheduled telephone appointment.  Following are the goals we discussed today:  Take all medications as prescribed Attend all scheduled provider appointments Call pharmacy for medication refills 3-7 days in advance of running out of medications Perform all self care activities independently  Perform IADL's (shopping, preparing meals, housekeeping, managing finances) independently Call provider office for new concerns or questions  Implement a daily exercise routine  Follow up with the Weight loss clinic and PREP (provider referred exercise program) as directed once authorized by your health plan (allow up to 2 weeks for the referrals to be authorized by your health plan) Continue your grief counseling for as long as you desire and notify Dr. Baird Cancer if symptoms worsen Keep a log of persistent palpitations and notify Dr. Baird Cancer of new symptoms or concerns  Let Dr. Baird Cancer know how the new medication regimen for your allergies is going   Our next appointment is by telephone on 11/18/21 at 12:00 PM to ensure your referrals have been authorized and scheduled   Please call the care guide team at [phone] if you need to cancel or reschedule your appointment.   If you are experiencing a Mental Health or [place] or need someone to talk to, please call 1-800-273-TALK (toll free, 24 hour hotline)   Patient verbalizes understanding of instructions and care plan provided today and agrees to view in [place]. Active MyChart status and patient understanding of how to access instructions and care plan via MyChart confirmed with patient.     Barb Merino, RN, BSN, CCM Care Management Coordinator [facility]  Direct Phone: [phone]

## 2021-10-29 NOTE — Chronic Care Management (AMB) (Cosign Needed)
Chronic Care Management   CCM RN Visit Note  10/29/2021 Name: Grace [name] MRN: [id number] DOB: 1961-02-10  Subjective: Grace [name] is a 61 y.o. year old female who is a primary care patient of [name], MD. The care management team was consulted for assistance with disease management and care coordination needs.    Engaged with patient by telephone for follow up visit in response to provider referral for case management and/or care coordination services.   Consent to Services:  The patient was given information about Chronic Care Management services, agreed to services, and gave verbal consent prior to initiation of services.  Please see initial visit note for detailed documentation.   Patient agreed to services and verbal consent obtained.   Assessment: Review of patient past medical history, allergies, medications, health status, including review of consultants reports, laboratory and other test data, was performed as part of comprehensive evaluation and provision of chronic care management services.   SDOH (Social Determinants of Health) assessments and interventions performed:  Yes, no acute needs   CCM Care Plan  Allergies  Allergen Reactions   Diclofenac Anaphylaxis   Lamictal [Lamotrigine] Shortness Of Breath, Itching and Swelling    Tongue swells   Mobic [Meloxicam] Anaphylaxis   Ultram [Tramadol Hcl] Anaphylaxis   Nefazodone Itching and Swelling    Other reaction(s): Arthralgia (Joint Pain); Throat swelling, mouth sores   Betaseron [Interferon Beta-1b] Other (See Comments)    Caused avascular necrosis   Codeine Itching   Copaxone [Glatiramer Acetate] Other (See Comments)    Altered mental status: mental changes, anxiety, motion sickness   Gabapentin Itching    Itching if she takes more than 343m   Latex     Latex band-aids cause redness and tears your skin   Nitrofurantoin     Causing itching all over, tongue swelling, chest heaviness, swollen lips,  cough, rapid HR, swollen eyelids, red eyes/gums, lips red/white per pt.   Oxcarbazepine Itching and Other (See Comments)    Dizziness, throat hurts   Teriflunomide Diarrhea    Hair loss, joint pains, elevated liver enzymes abugio (joint pains and liver issues)   Trazodone And Nefazodone     Mouth sores, tongue swelling   Tape Rash    Outpatient Encounter Medications as of 10/29/2021  Medication Sig   Ascorbic Acid (VITAMIN C) 1000 MG tablet Take 1,000 mg by mouth daily.   aspirin EC 81 MG tablet Take 81 mg by mouth daily.   AZO-CRANBERRY PO Take 1 capsule by mouth daily at 12 noon.   baclofen (LIORESAL) 10 MG tablet TAKE 2 TABLETS BY MOUTH 3 TIMES DAILY.   BLACK ELDERBERRY PO Take 300 mg by mouth in the morning and at bedtime.   Cholecalciferol (VITAMIN D) 50 MCG (2000 UT) CAPS Take 2,000 Units by mouth daily.    CINNAMON PO Take 1,200 mg by mouth daily.    diphenhydrAMINE (BENADRYL) 25 MG tablet Take 25 mg by mouth 3 (three) times daily. Takes with Hydrocodone   doxepin (SINEQUAN) 10 MG capsule Take 1-2 capsules (10-20 mg total) by mouth at bedtime.   EPINEPHrine 0.3 mg/0.3 mL IJ SOAJ injection Use as directed for life-threatening allergic reaction.   famotidine (PEPCID) 20 MG tablet TAKE 1 TABLET BY MOUTH TWICE A DAY   fexofenadine (ALLEGRA) 180 MG tablet Take 1 tablet (180 mg total) by mouth daily.   Flaxseed, Linseed, (FLAX SEED OIL) 1000 MG CAPS Take 1,200 mg by mouth daily.    fluconazole (DIFLUCAN)  150 MG tablet Take one tablet by mouth at the onset of symptoms then repeat in 48 hours (Patient not taking: Reported on 10/21/2021)   gabapentin (NEURONTIN) 300 MG capsule Take 1 capsule (300 mg total) by mouth 3 (three) times daily.   HYDROcodone-acetaminophen (NORCO/VICODIN) 5-325 MG tablet Take 1 tablet by mouth 3 (three) times daily as needed for moderate pain.   ipratropium (ATROVENT) 0.06 % nasal spray Use 2 sprays each nostril bid prn drainage   L-Lysine 1000 MG TABS Take by  mouth. As needed   Lactobacillus (PROBIOTIC ACIDOPHILUS PO) Take by mouth daily. Digestive Advantage BC   bacillus coagulans   levocetirizine (XYZAL) 5 MG tablet TAKE 1 TABLET BY MOUTH EVERY DAY IN THE EVENING   LORazepam (ATIVAN) 1 MG tablet Take 1 tablet (1 mg total) by mouth at bedtime.   Lutein-Bilberry (BILBERRY PLUS LUTEIN PO) Take by mouth daily.   Melatonin 10 MG TABS Take 1 tablet by mouth at bedtime.    methylphenidate (CONCERTA) 27 MG PO CR tablet Take 1 tablet (27 mg total) by mouth every morning.   montelukast (SINGULAIR) 10 MG tablet Take 1 tablet (10 mg total) by mouth daily.   Moringa 500 MG CAPS Take 2 capsules by mouth daily.   Multiple Vitamins-Minerals (HAIR/SKIN/NAILS/BIOTIN) TABS Take by mouth daily.    Nutritional Supplements (NUTRITIONAL SUPPLEMENT PO) Take 2.1 g by mouth in the morning and at bedtime. LION'S MANE   nystatin-triamcinolone ointment (MYCOLOG) Apply 1 application topically 2 (two) times daily.   promethazine-dextromethorphan (PROMETHAZINE-DM) 6.25-15 MG/5ML syrup Take 5 mLs by mouth 4 (four) times daily as needed for cough.   rizatriptan (MAXALT-MLT) 10 MG disintegrating tablet Take 1 tablet (10 mg total) by mouth as needed for migraine. May repeat in 2 hours if needed.  Max of 2 tablets in 24 hours, and 10 tablets per month   tiZANidine (ZANAFLEX) 2 MG tablet TAKE UP TO 4 PILLS A DAY   Vitamin D, Ergocalciferol, (DRISDOL) 1.25 MG (50000 UNIT) CAPS capsule ONE CAPSULE TWICE WEEKLY ON TUESDAYS/FRIDAYS   vitamin E 1000 UNIT capsule vitamin E 670 mg (1,000 unit) capsule   1 capsule every day by oral route.   zolpidem (AMBIEN) 10 MG tablet Take 10 mg by mouth at bedtime as needed. For sleep   No facility-administered encounter medications on file as of 10/29/2021.    Patient Active Problem List   Diagnosis Date Noted   Recurrent major depressive disorder, in full remission ([place]) 07/20/2021   Influenza vaccination declined 05/16/2021   New onset headache  05/16/2021   Urinary urgency 05/16/2021   Grief 05/16/2021   ADD (attention deficit disorder) without hyperactivity 12/23/2020   Chronic prescription opiate use 12/17/2020   Numbness 12/17/2020   Arthritis 11/26/2020   Irritable bowel syndrome 11/26/2020   At risk for heart disease 04/15/2020   Other hyperlipidemia with hypertriglyceridemia 04/15/2020   Cystoid macular edema of right eye 03/25/2020   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   Secondary open-angle glaucoma of right eye, 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   Prediabetes 01/01/2020   Vitamin D deficiency 01/01/2020   Vitamin B 12 deficiency 01/01/2020   At risk for diabetes mellitus 01/01/2020   Class 2 severe obesity due to excess calories with serious comorbidity and body mass index (BMI) of 35.0 to 35.9 in adult ([place]) 01/01/2020   Anosmia 11/15/2019   Disturbance of smell and taste 11/15/2019   Epiretinal membrane, right 07/03/2019   Depression 03/06/2019   Diverticulosis 06/14/2018   Hepatic steatosis 06/14/2018  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   Decreased pedal pulses 06/30/2016   Pain in both lower extremities 06/30/2016   Facial pain, atypical 06/08/2016   Fibroids 02/12/2016   Chest pain 07/07/2015   Dizziness and giddiness 07/07/2015   Leiomyoma of uterus 07/07/2015   Lower abdominal pain 07/07/2015   Hip pain 03/03/2015   Optic neuritis 06/27/2014   Multiple sclerosis ([place]) 06/26/2014   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/26/2014   Other fatigue 06/26/2014   Dysesthesia 06/26/2014   Depression with anxiety 06/26/2014   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   Insomnia 06/26/2014   Low serum vitamin D 06/26/2014    Conditions to be addressed/monitored: MS, Vitamin D Deficiency, Recurrent major depression, Class 1 Obesity due to excess calories, Prediabetes  Care Plan : RN Care Manager Plan of Care  Updates made by Lynne Logan, RN since 10/29/2021 12:00 AM     Problem: No plan of Care established for management  of chronic disease states (MS, Vitamin D Deficiency, Recurrent major depression, Class 1 Obesity due to excess calories, Prediabetes)   Priority: High     Long-Range Goal: Establishment of plan of care for management of chronic disease states (MS, Vitamin D Deficiency, Recurrent major depression, Class 1 Obesity due to excess calories, Prediabetes)   Start Date: 04/30/2021  Expected End Date: 04/30/2022  Recent Progress: On track  Priority: High  Note:   Current Barriers:  Knowledge Deficits related to plan of care for management of MS, Vitamin D Deficiency, Recurrent major depression, Class 1 Obesity due to excess calories, Prediabetes  Chronic Disease Management support and education needs related to MS, Vitamin D Deficiency, Recurrent major depression, Class 1 Obesity due to excess calories, Prediabetes   RNCM Clinical Goal(s):  Patient will verbalize basic understanding of  MS, Vitamin D Deficiency, Recurrent major depression, Class 1 Obesity due to excess calories, Prediabetes disease process and self health management plan as evidenced by patient will report no disease exacerbations related to her chronic disease states as listed above  take all medications exactly as prescribed and will call provider for medication related questions as evidenced by patient will report having no missed doses of her prescribed medications demonstrate Ongoing health management independence as evidenced by patient will report 100% adherence to her prescribed treatment plan  continue to work with RN Care Manager to address care management and care coordination needs related to  MS, Vitamin D Deficiency, Recurrent major depression, Class 1 Obesity due to excess calories, Prediabetes as evidenced by adherence to CM Team Scheduled appointments demonstrate ongoing self health care management ability   as evidenced by    through collaboration with RN Care manager, provider, and care team.   Interventions: 1:1  collaboration with primary care provider regarding development and update of comprehensive plan of care as evidenced by provider attestation and co-signature Inter-disciplinary care team collaboration (see longitudinal plan of care) Evaluation of current treatment plan related to  self management and patient's adherence to plan as established by provider  Class 1 Obesity:  (Status:  Goal on track:  Yes.)  Long Term Goal Evaluation of current treatment plan related to  Class 1 Obesity ,  self-management and patient's adherence to plan as established by provider Educated on the importance of implementing a routine exercise regimen to help build muscle mass, improve stamina, and endurance  Encouraged patient to chose the best of the day for which she feels energetic to perform her exercises Discussed alternatives to leaving her home for exercise including learning how  to perform chair exercises Resent printed educational material related to How to Perform Chair Exercises; mailed Elderly Nutrition 101  Determined patient discussed PREP with Dr. Baird Cancer and would like to participate, she would also like to revisit the weight loss clinic at [facility], patient stated Dr. Baird Cancer plans to send the appropriate referrals for these services   Educated patient on the referral process and when to expect to hear from the provider once the referral has been authorized  Discussed plans with patient for ongoing care management follow up and provided patient with direct contact information for care management team BMI 34.94 kg/m BSA 2.15 m Wt Readings from Last 3 Encounters:  10/21/21 217 lb 13 oz (98.8 kg)  10/21/21 217 lb 12.8 oz (98.8 kg)  09/10/21 217 lb (98.4 kg)     Glaucoma:  (Status:  Condition stable.  Not addressed this visit.)  Long Term Goal Evaluation of current treatment plan related to  Glaucoma , self-management and patient's adherence to plan as established by provider Reviewed visit note  from 12/08/20 per Dr. Edilia Bo Ophthalmology;  ASSESSMENT 1. Secondary open-angle glaucoma of right eye, indeterminate stage Patient is taking and tolerating medications as prescribed. IOP good OU. Exam stable.  Has not been taking tim since last visit here. IOP stable - will stop it.  Recheck in 3 months - get VF and OCT RNFL PLAN:  Stop Timolol x1 OD Atropine x1 OD prn  Recommend ATs prn  Reviewed proper drop instillation technique Return in about 3 months (around 03/10/2021) Ophthalmic Meds Ordered this visit:  There were no meds ordered this visit.  Discussed plans with patient for ongoing care management follow up and provided patient with direct contact information for care management team  Multiple Sclerosis:  (Status:  Condition stable.  Not addressed this visit.)  Long Term Goal Evaluation of current treatment plan related to  Multiple Sclerosis ,  self-management and patient's adherence to plan as established by provider. Determined patient continues to follow Dr. Felecia Shelling for management of MS, reviewed and discussed upcoming brain MRI per Dr. Felecia Shelling Instructed patient to discuss her concerns regarding clotting factors with Dr. Felecia Shelling at next visit due to patient has noticed she does not have the usual bleeding from small trama or cuts as expected despite being on low dose ASA Educated patient on s/s of UTI and when to seek medical attention if symptoms occur due to patient reporting urinary urgency, she denies other symptoms Determined Dr. Baird Cancer will not send urine culture at this time due to UA is negative  Discussed plans with patient for ongoing care management follow up and provided patient with direct contact information for care management team     Vitamin D deficiency:  (Status:  Condition stable.  Not addressed this visit.)  Long Term Goal Evaluation of current treatment plan related to  Vitamin D deficiency , self-management and patient's adherence to plan as established by  provider. Review of patient status, including review of consultant's reports, relevant laboratory and other test results, and medications completed Collaborated with Dr. Baird Cancer regarding patient's current dosing of Vitamin D and determined patient is taking Vitamin D 3, 50,000 units twice weekly and 2000 units OTC daily Patient encouraged to follow this regimen unless otherwise directed by Dr. Baird Cancer or other health care provider  Discussed plans with patient for ongoing care management follow up and provided patient with direct contact information for care management team Component Ref Range & Units 2 d ago  (04/28/21)  1 yr ago  (04/16/20) 1 yr ago  (10/10/19) 2 yr ago  (01/03/19) 2 yr ago  (06/01/18)  Vit D, 25-Hydroxy 30.0 - 100.0 ng/mL 34.8  42.5 CM  35.5 CM  32.1 CM  38.4 CM    Depression/Grief:  (Status:  Goal Met.)  Long Term Goal Evaluation of current treatment plan related to Depression, self-management and patient's adherence to plan as established by provider Determined patient feels her depression is under good control at this time although she continues to experience some bad days Determined patient will participate in a grief retreat in [place] starting this afternoon that will last several days Encouraged patient to keep PCP well informed of new or worsening symptoms of depression  Discussed plans with patient for ongoing care management follow up and provided patient with direct contact information for care management team  Palpitations:  (Status:  Goal Met.)  Short Term Goal Evaluation of current treatment plan related to  Palpitations ,  self-management and patient's adherence to plan as established by provider Reviewed and discussed the following Assessment/Plan per Dr. Claiborne Billings, Cardiology completed on 09/10/21;  ASSESSMENT:     1. Palpitations   2. Atypical chest pain   3. Multiple sclerosis (HCC)   4. Obesity, Class II, BMI 35-39.9   5. Neuropathy    PLAN:  Ms. Kariah Loredo is a 61 year old female who has a longstanding history of multiple sclerosis since 1997 and has been off therapy for the last 5 years.  Remotely, in 2013 she had experienced recurrent episodes of chest pain.  A Myoview study was negative but due to chest pain recurrence definitive cardiac catheterization was performed and she was found to have normal coronary arteries.  She has had issues with palpitations over the years and had seen Dr. Oval Linsey in 2017 and had a normal echo and monitor study.  Her most recent echo Doppler study was done in January 2023 which revealed normal systolic function with EF at 60 to 65%.  There was grade 1 mild diastolic relaxation impairment.  Valves were essentially normal.  Recently, she has noticed atypical chest pain and occasional palpitations.  She denies any sustained tacky dysrhythmia.  Her chest pain is left-sided, is nonexertional, and can persist for hours up to days and resolve spontaneously.  She has been taking Allegra-D for allergies.  I have recommended that she change this to just Allegra or Zyrtec or Claritin alone but not take the D version.  This should improve her risk for recurrent palpitations.  Her chest pain is atypical and nonischemic in etiology most likely representative of chest wall.  She continues to be followed by Dr. Rachel Moulds of [facility] neurology for her multiple sclerosis.  She is on gabapentin for neuropathy.  Clinically I believe she is stable.  Her blood pressure is well controlled.  At present I do not believe further cardiac studies are indicated.  I will see her on an as-needed basis in the future if recurrent symptoms develop. Determined patient verbalizes understanding of her prescribed treatment plan Determined patient remains to be concerned about having intermittent palpitations due to she has not be consistently taking the Allegra D Determined patient discussed her concerns with PCP Dr. Baird Cancer and will follow the advised  recommendations Encouraged patient to make note of ongoing palpitations including any contributing factors including medications taken Instructed patient to keep Dr. Baird Cancer well informed of new symptoms or concerns  Discussed plans with patient for ongoing care management follow up and provided  patient with direct contact information for care management team  Acute upper respiratory infection:  (Status:  Goal Met.)  Short Term Goal Evaluation of current treatment plan related to  acute upper respiratory symptoms , self-management and patient's adherence to plan as established by provider Determined patient acute upper respiratory infection has resolved Reviewed and discussed recent medication changes per Dr. Baird Cancer to treat patient's allergies, patient verbalizes understanding and adherence to her prescribed plan of care  Discussed plans with patient for ongoing care management follow up and provided patient with direct contact information for care management team  Patient Goals/Self-Care Activities: Take all medications as prescribed Attend all scheduled provider appointments Call pharmacy for medication refills 3-7 days in advance of running out of medications Perform all self care activities independently  Perform IADL's (shopping, preparing meals, housekeeping, managing finances) independently Call provider office for new concerns or questions  Implement a daily exercise routine  Follow up with the Weight loss clinic and PREP (provider referred exercise program) as directed once authorized by your health plan (allow up to 2 weeks for the referrals to be authorized by your health plan) Continue your grief counseling for as long as you desire and notify Dr. Baird Cancer if symptoms worsen Keep a log of persistent palpitations and notify Dr. Baird Cancer of new symptoms or concerns  Let Dr. Baird Cancer know how the new medication regimen for your allergies is going    Follow Up Plan:  Telephone follow  up appointment with care management team member scheduled for:  11/18/21     Barb Merino, RN, BSN, CCM Care Management Coordinator [place] [facility]  Direct Phone: [phone]

## 2021-11-05 ENCOUNTER — Other Ambulatory Visit: Payer: Self-pay | Admitting: Neurology

## 2021-11-06 DIAGNOSIS — F32A Depression, unspecified: Secondary | ICD-10-CM | POA: Diagnosis not present

## 2021-11-06 DIAGNOSIS — H409 Unspecified glaucoma: Secondary | ICD-10-CM

## 2021-11-12 ENCOUNTER — Other Ambulatory Visit: Payer: Self-pay | Admitting: Neurology

## 2021-11-12 MED ORDER — METHYLPHENIDATE HCL ER (OSM) 27 MG PO TBCR: 27.0000 mg | EXTENDED_RELEASE_TABLET | ORAL | 0 refills | Status: DC

## 2021-11-12 MED ORDER — HYDROCODONE-ACETAMINOPHEN 5-325 MG PO TABS
1.0000 | ORAL_TABLET | Freq: Three times a day (TID) | ORAL | 0 refills | Status: DC | PRN
Start: 2021-11-12 — End: 2022-04-21

## 2021-11-12 NOTE — Telephone Encounter (Signed)
[facility] pharmacy is calling for refill on methylphenidate (CONCERTA) 27 MG PO CR tablet and  HYDROcodone-acetaminophen (NORCO/VICODIN) 5-325 MG tablet \.

## 2021-11-16 ENCOUNTER — Encounter: Payer: Self-pay | Admitting: *Deleted

## 2021-11-16 ENCOUNTER — Other Ambulatory Visit: Payer: Self-pay | Admitting: *Deleted

## 2021-11-16 MED ORDER — METHYLPHENIDATE HCL 10 MG PO TABS: 20.0000 mg | ORAL_TABLET | ORAL | 0 refills | Status: DC

## 2021-11-18 ENCOUNTER — Ambulatory Visit (INDEPENDENT_AMBULATORY_CARE_PROVIDER_SITE_OTHER): Payer: Medicare Other

## 2021-11-18 ENCOUNTER — Other Ambulatory Visit: Payer: Self-pay | Admitting: Internal Medicine

## 2021-11-18 ENCOUNTER — Telehealth: Payer: Self-pay

## 2021-11-18 ENCOUNTER — Telehealth: Payer: Medicare Other

## 2021-11-18 DIAGNOSIS — E559 Vitamin D deficiency, unspecified: Secondary | ICD-10-CM

## 2021-11-18 DIAGNOSIS — R7303 Prediabetes: Secondary | ICD-10-CM

## 2021-11-18 DIAGNOSIS — F3342 Major depressive disorder, recurrent, in full remission: Secondary | ICD-10-CM

## 2021-11-18 DIAGNOSIS — G35 Multiple sclerosis: Secondary | ICD-10-CM

## 2021-11-18 DIAGNOSIS — E6609 Other obesity due to excess calories: Secondary | ICD-10-CM

## 2021-11-18 NOTE — Patient Instructions (Signed)
Visit Information  Thank you for taking time to visit with me today. Please don't hesitate to contact me if I can be of assistance to you before our next scheduled telephone appointment.  Following are the goals we discussed today:  Take all medications as prescribed Attend all scheduled provider appointments Call pharmacy for medication refills 3-7 days in advance of running out of medications Perform all self care activities independently  Perform IADL's (shopping, preparing meals, housekeeping, managing finances) independently Call provider office for new concerns or questions  Implement a daily exercise routine  Follow up with the Weight loss clinic and PREP (provider referred exercise program) as directed once authorized by your health plan (allow up to 2 weeks for the referrals to be authorized by your health plan) Continue your grief counseling for as long as you desire and notify Dr. Baird Cancer if symptoms worsen Keep a log of persistent palpitations and notify Dr. Baird Cancer of new symptoms or concerns  Let Dr. Baird Cancer know how the new medication regimen for your allergies is going   If you are experiencing a Mental Health or [place] or need someone to talk to, please call 1-800-273-TALK (toll free, 24 hour hotline)   Patient verbalizes understanding of instructions and care plan provided today and agrees to view in [place]. Active MyChart status and patient understanding of how to access instructions and care plan via MyChart confirmed with patient.     Barb Merino, RN, BSN, CCM Care Management Coordinator [place] [facility]  Direct Phone: [phone]

## 2021-11-18 NOTE — Chronic Care Management (AMB) (Signed)
     Grace [name] was reminded to have all medications, supplements and any blood glucose and blood pressure readings available for review with Grace [name], Pharm. D, at her telephone visit on 11-18-2021 at 11:00.    [place] Pharmacist Assistant [phone]

## 2021-11-18 NOTE — Chronic Care Management (AMB) (Signed)
Chronic Care Management   CCM RN Visit Note  11/18/2021 Name: Grace [name] MRN: [id number] DOB: May 20, 1960  Subjective: Grace [name] is a 62 y.o. year old female who is a primary care patient of [name], MD. The care management team was consulted for assistance with disease management and care coordination needs.    Engaged with patient by telephone for follow up visit in response to provider referral for case management and/or care coordination services.   Consent to Services:  The patient was given information about Chronic Care Management services, agreed to services, and gave verbal consent prior to initiation of services.  Please see initial visit note for detailed documentation.   Patient agreed to services and verbal consent obtained.   Assessment: Review of patient past medical history, allergies, medications, health status, including review of consultants reports, laboratory and other test data, was performed as part of comprehensive evaluation and provision of chronic care management services.   SDOH (Social Determinants of Health) assessments and interventions performed:  Yes, no acute needs  CCM Care Plan  Allergies  Allergen Reactions   Diclofenac Anaphylaxis   Lamictal [Lamotrigine] Shortness Of Breath, Itching and Swelling    Tongue swells   Mobic [Meloxicam] Anaphylaxis   Ultram [Tramadol Hcl] Anaphylaxis   Nefazodone Itching and Swelling    Other reaction(s): Arthralgia (Joint Pain); Throat swelling, mouth sores   Betaseron [Interferon Beta-1b] Other (See Comments)    Caused avascular necrosis   Codeine Itching   Copaxone [Glatiramer Acetate] Other (See Comments)    Altered mental status: mental changes, anxiety, motion sickness   Gabapentin Itching    Itching if she takes more than 375m   Latex     Latex band-aids cause redness and tears your skin   Nitrofurantoin     Causing itching all over, tongue swelling, chest heaviness, swollen lips,  cough, rapid HR, swollen eyelids, red eyes/gums, lips red/white per pt.   Oxcarbazepine Itching and Other (See Comments)    Dizziness, throat hurts   Teriflunomide Diarrhea    Hair loss, joint pains, elevated liver enzymes abugio (joint pains and liver issues)   Trazodone And Nefazodone     Mouth sores, tongue swelling   Tape Rash    Outpatient Encounter Medications as of 11/18/2021  Medication Sig   Ascorbic Acid (VITAMIN C) 1000 MG tablet Take 1,000 mg by mouth daily.   aspirin EC 81 MG tablet Take 81 mg by mouth daily.   AZO-CRANBERRY PO Take 1 capsule by mouth daily at 12 noon.   baclofen (LIORESAL) 10 MG tablet TAKE 2 TABLETS BY MOUTH 3 TIMES DAILY.   BLACK ELDERBERRY PO Take 300 mg by mouth in the morning and at bedtime.   Cholecalciferol (VITAMIN D) 50 MCG (2000 UT) CAPS Take 2,000 Units by mouth daily.    CINNAMON PO Take 1,200 mg by mouth daily.    diphenhydrAMINE (BENADRYL) 25 MG tablet Take 25 mg by mouth 3 (three) times daily. Takes with Hydrocodone   doxepin (SINEQUAN) 10 MG capsule Take 1-2 capsules (10-20 mg total) by mouth at bedtime.   EPINEPHrine 0.3 mg/0.3 mL IJ SOAJ injection Use as directed for life-threatening allergic reaction.   famotidine (PEPCID) 20 MG tablet TAKE 1 TABLET BY MOUTH TWICE A DAY   fexofenadine (ALLEGRA) 180 MG tablet Take 1 tablet (180 mg total) by mouth daily.   Flaxseed, Linseed, (FLAX SEED OIL) 1000 MG CAPS Take 1,200 mg by mouth daily.    fluconazole (DIFLUCAN) 150  MG tablet Take one tablet by mouth at the onset of symptoms then repeat in 48 hours (Patient not taking: Reported on 10/21/2021)   gabapentin (NEURONTIN) 300 MG capsule Take 1 capsule (300 mg total) by mouth 3 (three) times daily.   HYDROcodone-acetaminophen (NORCO/VICODIN) 5-325 MG tablet Take 1 tablet by mouth 3 (three) times daily as needed for moderate pain.   ipratropium (ATROVENT) 0.06 % nasal spray Use 2 sprays each nostril bid prn drainage   L-Lysine 1000 MG TABS Take by  mouth. As needed   Lactobacillus (PROBIOTIC ACIDOPHILUS PO) Take by mouth daily. Digestive Advantage BC   bacillus coagulans   levocetirizine (XYZAL) 5 MG tablet TAKE 1 TABLET BY MOUTH EVERY DAY IN THE EVENING   LORazepam (ATIVAN) 1 MG tablet Take 1 tablet (1 mg total) by mouth at bedtime.   Lutein-Bilberry (BILBERRY PLUS LUTEIN PO) Take by mouth daily.   Melatonin 10 MG TABS Take 1 tablet by mouth at bedtime.    methylphenidate (CONCERTA) 27 MG PO CR tablet Take 1 tablet (27 mg total) by mouth every morning.   methylphenidate (RITALIN) 10 MG tablet Take 2 tablets (20 mg total) by mouth every morning.   montelukast (SINGULAIR) 10 MG tablet Take 1 tablet (10 mg total) by mouth daily.   Moringa 500 MG CAPS Take 2 capsules by mouth daily.   Multiple Vitamins-Minerals (HAIR/SKIN/NAILS/BIOTIN) TABS Take by mouth daily.    Nutritional Supplements (NUTRITIONAL SUPPLEMENT PO) Take 2.1 g by mouth in the morning and at bedtime. LION'S MANE   nystatin-triamcinolone ointment (MYCOLOG) Apply 1 application topically 2 (two) times daily.   promethazine-dextromethorphan (PROMETHAZINE-DM) 6.25-15 MG/5ML syrup Take 5 mLs by mouth 4 (four) times daily as needed for cough.   rizatriptan (MAXALT-MLT) 10 MG disintegrating tablet Take 1 tablet (10 mg total) by mouth as needed for migraine. May repeat in 2 hours if needed.  Max of 2 tablets in 24 hours, and 10 tablets per month   tiZANidine (ZANAFLEX) 2 MG tablet TAKE UP TO 4 TABLETS BY MOUTH EVERY DAY   Vitamin D, Ergocalciferol, (DRISDOL) 1.25 MG (50000 UNIT) CAPS capsule ONE CAPSULE TWICE WEEKLY ON TUESDAYS/FRIDAYS   vitamin E 1000 UNIT capsule vitamin E 670 mg (1,000 unit) capsule   1 capsule every day by oral route.   zolpidem (AMBIEN) 10 MG tablet Take 10 mg by mouth at bedtime as needed. For sleep   No facility-administered encounter medications on file as of 11/18/2021.    Patient Active Problem List   Diagnosis Date Noted   Recurrent major depressive  disorder, in full remission ([place]) 07/20/2021   Influenza vaccination declined 05/16/2021   New onset headache 05/16/2021   Urinary urgency 05/16/2021   Grief 05/16/2021   ADD (attention deficit disorder) without hyperactivity 12/23/2020   Chronic prescription opiate use 12/17/2020   Numbness 12/17/2020   Arthritis 11/26/2020   Irritable bowel syndrome 11/26/2020   At risk for heart disease 04/15/2020   Other hyperlipidemia with hypertriglyceridemia 04/15/2020   Cystoid macular edema of right eye 03/25/2020   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   Secondary open-angle glaucoma of right eye, 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   Prediabetes 01/01/2020   Vitamin D deficiency 01/01/2020   Vitamin B 12 deficiency 01/01/2020   At risk for diabetes mellitus 01/01/2020   Class 2 severe obesity due to excess calories with serious comorbidity and body mass index (BMI) of 35.0 to 35.9 in adult [facility]) 01/01/2020   Anosmia 11/15/2019   Disturbance of smell and taste 11/15/2019  Epiretinal membrane, right 07/03/2019   Depression 03/06/2019   Diverticulosis 06/14/2018   Hepatic steatosis 06/14/2018   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   Decreased pedal pulses 06/30/2016   Pain in both lower extremities 06/30/2016   Facial pain, atypical 06/08/2016   Fibroids 02/12/2016   Chest pain 07/07/2015   Dizziness and giddiness 07/07/2015   Leiomyoma of uterus 07/07/2015   Lower abdominal pain 07/07/2015   Hip pain 03/03/2015   Optic neuritis 06/27/2014   Multiple sclerosis ([place]) 06/26/2014   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/26/2014   Other fatigue 06/26/2014   Dysesthesia 06/26/2014   Depression with anxiety 06/26/2014   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   Insomnia 06/26/2014   Low serum vitamin D 06/26/2014    Conditions to be addressed/monitored: MS, Vitamin D Deficiency, Recurrent major depression, Class 1 Obesity due to excess calories, Prediabetes  Care Plan : RN Care Manager Plan of Care  Updates  made by Lynne Logan, RN since 11/18/2021 12:00 AM     Problem: No plan of Care established for management of chronic disease states (MS, Vitamin D Deficiency, Recurrent major depression, Class 1 Obesity due to excess calories, Prediabetes)   Priority: High     Long-Range Goal: Establishment of plan of care for management of chronic disease states (MS, Vitamin D Deficiency, Recurrent major depression, Class 1 Obesity due to excess calories, Prediabetes)   Start Date: 04/30/2021  Expected End Date: 04/30/2022  Recent Progress: On track  Priority: High  Note:   Current Barriers:  Knowledge Deficits related to plan of care for management of MS, Vitamin D Deficiency, Recurrent major depression, Class 1 Obesity due to excess calories, Prediabetes  Chronic Disease Management support and education needs related to MS, Vitamin D Deficiency, Recurrent major depression, Class 1 Obesity due to excess calories, Prediabetes   RNCM Clinical Goal(s):  Patient will verbalize basic understanding of  MS, Vitamin D Deficiency, Recurrent major depression, Class 1 Obesity due to excess calories, Prediabetes disease process and self health management plan as evidenced by patient will report no disease exacerbations related to her chronic disease states as listed above  take all medications exactly as prescribed and will call provider for medication related questions as evidenced by patient will report having no missed doses of her prescribed medications demonstrate Ongoing health management independence as evidenced by patient will report 100% adherence to her prescribed treatment plan  continue to work with RN Care Manager to address care management and care coordination needs related to  MS, Vitamin D Deficiency, Recurrent major depression, Class 1 Obesity due to excess calories, Prediabetes as evidenced by adherence to CM Team Scheduled appointments demonstrate ongoing self health care management ability   as  evidenced by    through collaboration with RN Care manager, provider, and care team.   Interventions: 1:1 collaboration with primary care provider regarding development and update of comprehensive plan of care as evidenced by provider attestation and co-signature Inter-disciplinary care team collaboration (see longitudinal plan of care) Evaluation of current treatment plan related to  self management and patient's adherence to plan as established by provider  Class 1 Obesity:  (Status:  Goal Met.)  Long Term Goal Evaluation of current treatment plan related to  Class 1 Obesity ,  self-management and patient's adherence to plan as established by provider Educated on the importance of implementing a routine exercise regimen to help build muscle mass, improve stamina, and endurance  Encouraged patient to chose the best of the day for which she feels  energetic to perform her exercises Discussed alternatives to leaving her home for exercise including learning how to perform chair exercises Resent printed educational material related to How to Perform Chair Exercises; mailed Elderly Nutrition 101  Determined patient discussed PREP with Dr. Baird Cancer and would like to participate, she would also like to revisit the weight loss clinic at [facility], patient stated Dr. Baird Cancer plans to send the appropriate referrals for these services   Educated patient on the referral process and when to expect to hear from the provider once the referral has been authorized  Discussed plans with patient for ongoing care management follow up and provided patient with direct contact information for care management team BMI 34.94 kg/m BSA 2.15 m Wt Readings from Last 3 Encounters:  10/21/21 217 lb 13 oz (98.8 kg)  10/21/21 217 lb 12.8 oz (98.8 kg)  09/10/21 217 lb (98.4 kg)   Placed successful outbound call to patient, confirmed with patient Dr. Baird Cancer sent referrals for PREP and Weight Loss clinic  Educated patient on  the referral process and next steps, she verbalizes understanding   Glaucoma:  (Status:  Goal Met.)  Long Term Goal Evaluation of current treatment plan related to  Glaucoma , self-management and patient's adherence to plan as established by provider Determined patient continues to have her glaucoma managed by Dr. Marilynne Halsted at the [facility] in [place], [place] Determined patient is adherent to her prescribed treatment plan, she voices no new symptoms or  concerns today  Discussed plans with patient for ongoing care management follow up and provided patient with direct contact information for care management team  Multiple Sclerosis:  (Status:  Goal Met.)  Long Term Goal Evaluation of current treatment plan related to  Multiple Sclerosis ,  self-management and patient's adherence to plan as established by provider. Determined patient continues to follow Dr. Felecia Shelling for management of her MS Determined patient's condition is stable, she voices having no new symptoms or concerns at this time  Discussed plans with patient for ongoing care management follow up and provided patient with direct contact information for care management team     Vitamin D deficiency:  (Status:  Goal Met.)  Long Term Goal Evaluation of current treatment plan related to  Vitamin D deficiency , self-management and patient's adherence to plan as established by provider. Review of patient status, including review of consultant's reports, relevant laboratory and other test results, and medications completed Patient encouraged to follow her current Vitamin D regimen unless otherwise directed by Dr. Baird Cancer or other health care provider  Discussed plans with patient for ongoing care management follow up and provided patient with direct contact information for care management team Component Ref Range & Units 2 d ago  (04/28/21) 1 yr ago  (04/16/20) 1 yr ago  (10/10/19) 2 yr ago  (01/03/19) 2 yr ago  (06/01/18)  Vit D,  25-Hydroxy 30.0 - 100.0 ng/mL 34.8  42.5 CM  35.5 CM  32.1 CM  38.4 CM    Depression/Grief:  (Status:  Goal Met.)  Long Term Goal Evaluation of current treatment plan related to Depression, self-management and patient's adherence to plan as established by provider Determined patient feels her depression is under good control at this time although she continues to experience some bad days Determined patient will participate in a grief retreat in [place] starting this afternoon that will last several days Encouraged patient to keep PCP well informed of new or worsening symptoms of depression  Discussed plans with  patient for ongoing care management follow up and provided patient with direct contact information for care management team  Palpitations:  (Status:  Goal Met.)  Short Term Goal Evaluation of current treatment plan related to  Palpitations ,  self-management and patient's adherence to plan as established by provider Reviewed and discussed the following Assessment/Plan per Dr. Claiborne Billings, Cardiology completed on 09/10/21;  ASSESSMENT:     1. Palpitations   2. Atypical chest pain   3. Multiple sclerosis (HCC)   4. Obesity, Class II, BMI 35-39.9   5. Neuropathy    PLAN:  Ms. Jaretssi Kraker is a 61 year old female who has a longstanding history of multiple sclerosis since 1997 and has been off therapy for the last 5 years.  Remotely, in 2013 she had experienced recurrent episodes of chest pain.  A Myoview study was negative but due to chest pain recurrence definitive cardiac catheterization was performed and she was found to have normal coronary arteries.  She has had issues with palpitations over the years and had seen Dr. Oval Linsey in 2017 and had a normal echo and monitor study.  Her most recent echo Doppler study was done in January 2023 which revealed normal systolic function with EF at 60 to 65%.  There was grade 1 mild diastolic relaxation impairment.  Valves were essentially normal.   Recently, she has noticed atypical chest pain and occasional palpitations.  She denies any sustained tacky dysrhythmia.  Her chest pain is left-sided, is nonexertional, and can persist for hours up to days and resolve spontaneously.  She has been taking Allegra-D for allergies.  I have recommended that she change this to just Allegra or Zyrtec or Claritin alone but not take the D version.  This should improve her risk for recurrent palpitations.  Her chest pain is atypical and nonischemic in etiology most likely representative of chest wall.  She continues to be followed by Dr. Rachel Moulds of [facility] neurology for her multiple sclerosis.  She is on gabapentin for neuropathy.  Clinically I believe she is stable.  Her blood pressure is well controlled.  At present I do not believe further cardiac studies are indicated.  I will see her on an as-needed basis in the future if recurrent symptoms develop. Determined patient verbalizes understanding of her prescribed treatment plan Determined patient remains to be concerned about having intermittent palpitations due to she has not be consistently taking the Allegra D Determined patient discussed her concerns with PCP Dr. Baird Cancer and will follow the advised recommendations Encouraged patient to make note of ongoing palpitations including any contributing factors including medications taken Instructed patient to keep Dr. Baird Cancer well informed of new symptoms or concerns  Discussed plans with patient for ongoing care management follow up and provided patient with direct contact information for care management team  Acute upper respiratory infection:  (Status:  Goal Met.)  Short Term Goal Evaluation of current treatment plan related to  acute upper respiratory symptoms , self-management and patient's adherence to plan as established by provider Determined patient acute upper respiratory infection has resolved Reviewed and discussed recent medication changes per Dr.  Baird Cancer to treat patient's allergies, patient verbalizes understanding and adherence to her prescribed plan of care  Discussed plans with patient for ongoing care management follow up and provided patient with direct contact information for care management team  Patient Goals/Self-Care Activities: Take all medications as prescribed Attend all scheduled provider appointments Call pharmacy for medication refills 3-7 days in advance of running out of medications  Perform all self care activities independently  Perform IADL's (shopping, preparing meals, housekeeping, managing finances) independently Call provider office for new concerns or questions  Implement a daily exercise routine  Follow up with the Weight loss clinic and PREP (provider referred exercise program) as directed once authorized by your health plan (allow up to 2 weeks for the referrals to be authorized by your health plan) Continue your grief counseling for as long as you desire and notify Dr. Baird Cancer if symptoms worsen Keep a log of persistent palpitations and notify Dr. Baird Cancer of new symptoms or concerns  Let Dr. Baird Cancer know how the new medication regimen for your allergies is going    Follow Up Plan:  No further follow up required:       Barb Merino, RN, BSN, CCM Care Management Coordinator [place] [facility]  Direct Phone: [phone]

## 2021-11-20 ENCOUNTER — Ambulatory Visit: Payer: Medicare Other

## 2021-11-20 DIAGNOSIS — E559 Vitamin D deficiency, unspecified: Secondary | ICD-10-CM

## 2021-11-20 DIAGNOSIS — F418 Other specified anxiety disorders: Secondary | ICD-10-CM

## 2021-11-20 NOTE — Progress Notes (Signed)
Chronic Care Management Pharmacy Note  11/20/2021 Name:  Grace [name] MRN:  [id number] DOB:  [birth year]  Summary: Patient reports that she is doing fine.    Recommendations/Changes made from today's visit: Recommended we review her current vitamins and review there benefit.   Plan: Patient reports that at this time she does not want to change any of her vitamins.    Subjective: Grace [name] is an 61 y.o. year old female who is a primary patient of [name], MD.  The CCM team was consulted for assistance with disease management and care coordination needs.    Engaged with patient by telephone for follow up visit in response to provider referral for pharmacy case management and/or care coordination services.   Consent to Services:  The patient was given information about Chronic Care Management services, agreed to services, and gave verbal consent prior to initiation of services.  Please see initial visit note for detailed documentation.   Patient Care Team: [name], MD as PCP - General (Internal Medicine) Felecia Shelling, Nanine Means, MD (Neurology) Mayford Knife, [facility] (Pharmacist)  Recent office visits: 10/21/2021 PCP OV  Recent consult visits: None noted   Hospital visits: None in previous 6 months   Objective:  Lab Results  Component Value Date   CREATININE 0.64 07/20/2021   BUN 18 07/20/2021   EGFR 102 07/20/2021   GFRNONAA 96 04/16/2020   GFRAA 110 04/16/2020   NA 142 07/20/2021   K 4.5 07/20/2021   CALCIUM 9.6 07/20/2021   CO2 21 07/20/2021   GLUCOSE 92 07/20/2021    Lab Results  Component Value Date/Time   HGBA1C 5.6 04/16/2020 04:35 PM   HGBA1C 5.9 (H) 11/22/2019 05:10 PM    Last diabetic Eye exam: No results found for: "HMDIABEYEEXA"  Last diabetic Foot exam: No results found for: "HMDIABFOOTEX"   Lab Results  Component Value Date   CHOL 172 10/21/2021   HDL 66 10/21/2021   LDLCALC 78 10/21/2021   TRIG 166 (H) 10/21/2021   CHOLHDL  2.6 10/21/2021       Latest Ref Rng & Units 10/21/2021    3:42 PM 07/20/2021    3:39 PM 04/28/2021   10:29 AM  Hepatic Function  Total Protein 6.0 - 8.5 g/dL 7.2  7.0  6.7   Albumin 3.8 - 4.9 g/dL 4.4  4.5  4.4   AST 0 - 40 IU/L 55  28  25   ALT 0 - 32 IU/L 60  35  30   Alk Phosphatase 44 - 121 IU/L 117  134  108   Total Bilirubin 0.0 - 1.2 mg/dL 0.4  0.3  0.5   Bilirubin, Direct 0.00 - 0.40 mg/dL 0.16       Lab Results  Component Value Date/Time   TSH 1.530 05/20/2021 05:03 PM   TSH 1.500 11/22/2019 05:10 PM   FREET4 1.46 11/22/2019 05:10 PM   FREET4 0.89 05/23/2018 03:29 PM       Latest Ref Rng & Units 07/20/2021    3:39 PM 04/28/2021   10:29 AM 11/22/2019    5:10 PM  CBC  WBC 3.4 - 10.8 x10E3/uL 6.3  5.0  10.3   Hemoglobin 11.1 - 15.9 g/dL 13.8  13.7  13.7   Hematocrit 34.0 - 46.6 % 40.4  40.0  40.3   Platelets 150 - 450 x10E3/uL 298  317  275     Lab Results  Component Value Date/Time   VD25OH 34.8 04/28/2021 10:29 AM  VD25OH 42.5 04/16/2020 04:35 PM    Clinical ASCVD: No  The 10-year ASCVD risk score (Arnett DK, et al., 2019) is: 1.5%   Values used to calculate the score:     Age: 57 years     Sex: Female     Is Non-Hispanic African American: No     Diabetic: No     Tobacco smoker: No     Systolic Blood Pressure: 650 mmHg     Is BP treated: No     HDL Cholesterol: 66 mg/dL     Total Cholesterol: 172 mg/dL       10/21/2021    2:46 PM 05/20/2021   12:43 PM 04/28/2021    9:17 AM  Depression screen PHQ 2/9  Decreased Interest 0 1 2  Down, Depressed, Hopeless 0 1 2  PHQ - 2 Score 0 2 4  Altered sleeping  2 3  Tired, decreased energy  2 3  Change in appetite  1 3  Feeling bad or failure about yourself   0 1  Trouble concentrating  2 3  Moving slowly or fidgety/restless  0 0  Suicidal thoughts  0 0  PHQ-9 Score  9 17  Difficult doing work/chores  Somewhat difficult Somewhat difficult      Social History   Tobacco Use  Smoking Status Former    Packs/day: 0.25   Years: 8.00   Total pack years: 2.00   Types: Cigarettes   Quit date: 07/01/1995   Years since quitting: 26.4  Smokeless Tobacco Never   BP Readings from Last 3 Encounters:  10/21/21 110/70  10/21/21 110/70  09/10/21 136/82   Pulse Readings from Last 3 Encounters:  10/21/21 69  10/21/21 69  09/10/21 80   Wt Readings from Last 3 Encounters:  10/21/21 217 lb 13 oz (98.8 kg)  10/21/21 217 lb 12.8 oz (98.8 kg)  09/10/21 217 lb (98.4 kg)   BMI Readings from Last 3 Encounters:  10/21/21 34.94 kg/m  10/21/21 34.94 kg/m  09/10/21 35.02 kg/m    Assessment/Interventions: Review of patient past medical history, allergies, medications, health status, including review of consultants reports, laboratory and other test data, was performed as part of comprehensive evaluation and provision of chronic care management services.   SDOH:  (Social Determinants of Health) assessments and interventions performed: No  SDOH Screenings   Alcohol Screen: Not on file  Depression (PHQ2-9): Low Risk  (10/21/2021)   Depression (PHQ2-9)    PHQ-2 Score: 0  Financial Resource Strain: Low Risk  (10/21/2021)   Overall Financial Resource Strain (CARDIA)    Difficulty of Paying Living Expenses: Not hard at all  Food Insecurity: No Food Insecurity (10/21/2021)   Hunger Vital Sign    Worried About Running Out of Food in the Last Year: Never true    [place] in the Last Year: Never true  Housing: Low Risk  (03/13/2021)   Housing    Last Housing Risk Score: 0  Physical Activity: Sufficiently Active (10/21/2021)   Exercise Vital Sign    Days of Exercise per Week: 7 days    Minutes of Exercise per Session: 40 min  Social Connections: Not on file  Stress: No Stress Concern Present (10/21/2021)   [place]    Feeling of Stress : Not at all  Tobacco Use: Medium Risk (10/21/2021)   Patient History    Smoking Tobacco Use:  Former    Smokeless Tobacco Use: Never  Passive Exposure: Not on file  Transportation Needs: No Transportation Needs (10/21/2021)   PRAPARE - Hydrologist (Medical): No    Lack of Transportation (Non-Medical): No    CCM Care Plan  Allergies  Allergen Reactions   Diclofenac Anaphylaxis   Lamictal [Lamotrigine] Shortness Of Breath, Itching and Swelling    Tongue swells   Mobic [Meloxicam] Anaphylaxis   Ultram [Tramadol Hcl] Anaphylaxis   Nefazodone Itching and Swelling    Other reaction(s): Arthralgia (Joint Pain); Throat swelling, mouth sores   Betaseron [Interferon Beta-1b] Other (See Comments)    Caused avascular necrosis   Codeine Itching   Copaxone [Glatiramer Acetate] Other (See Comments)    Altered mental status: mental changes, anxiety, motion sickness   Gabapentin Itching    Itching if she takes more than 346m   Latex     Latex band-aids cause redness and tears your skin   Nitrofurantoin     Causing itching all over, tongue swelling, chest heaviness, swollen lips, cough, rapid HR, swollen eyelids, red eyes/gums, lips red/white per pt.   Oxcarbazepine Itching and Other (See Comments)    Dizziness, throat hurts   Teriflunomide Diarrhea    Hair loss, joint pains, elevated liver enzymes abugio (joint pains and liver issues)   Trazodone And Nefazodone     Mouth sores, tongue swelling   Tape Rash    Medications Reviewed Today     Reviewed by LLynne Logan RN (Registered Nurse) on 11/18/21 at 1218  Med List Status: <None>   Medication Order Taking? Sig Documenting Provider Last Dose Status Informant  Ascorbic Acid (VITAMIN C) 1000 MG tablet [id number] Take 1,000 mg by mouth daily. [provider] Taking Active Self  aspirin EC 81 MG tablet [id number] Take 81 mg by mouth daily. [provider] Taking Active Self  AZO-CRANBERRY PO [id number] Take 1 capsule by mouth daily at 12 noon. [provider] Taking  Active   baclofen (LIORESAL) 10 MG tablet [id number] TAKE 2 TABLETS BY MOUTH 3 TIMES DAILY. Sater, RNanine Means MD Taking Active   BLACK ELDERBERRY PO [id number] Take 300 mg by mouth in the morning and at bedtime. [provider] Taking Active   Cholecalciferol (VITAMIN D) 50 MCG (2000 UT) CAPS [id number] Take 2,000 Units by mouth daily.  [provider] Taking Active   CINNAMON PO [id number] Take 1,200 mg by mouth daily.  [provider] Taking Active   diphenhydrAMINE (BENADRYL) 25 MG tablet [id number] Take 25 mg by mouth 3 (three) times daily. Takes with Hydrocodone [provider] Taking Active Self  doxepin (SINEQUAN) 10 MG capsule [id number] Take 1-2 capsules (10-20 mg total) by mouth at bedtime. Sater, RNanine Means MD Taking Active   EPINEPHrine 0.3 mg/0.3 mL IJ SOAJ injection [id number] Use as directed for life-threatening allergic reaction. KJiles Prows MD Taking Active   famotidine (PEPCID) 20 MG tablet [id number] TAKE 1 TABLET BY MOUTH TWICE A DLynnell Dike MD Taking Active   fexofenadine ([facility] 180 MG tablet [id number] Take 1 tablet (180 mg total) by mouth daily. SGlendale Chard MD  Active   Flaxseed, Linseed, (FLAX SEED OIL) 1000 MG CAPS [id number] Take 1,200 mg by mouth daily.  [provider] Taking Active   fluconazole (DIFLUCAN) 150 MG tablet [id number] Take one tablet by mouth at the onset of symptoms then repeat in 48 hours  Patient not taking: Reported on  10/21/2021   [name], MD Not Taking Active   gabapentin (NEURONTIN) 300 MG capsule [id number] No Take 1 capsule (300 mg total) by mouth 3 (three) times daily. Lomax, Amy, NP Taking Active   HYDROcodone-acetaminophen (NORCO/VICODIN) 5-325 MG tablet [id number]  Take 1 tablet by mouth 3 (three) times daily as needed for moderate pain. Sater, Nanine Means, MD  Active   ipratropium (ATROVENT) 0.06 % nasal spray [id number] No Use 2 sprays each nostril bid prn  drainage [name], MD Taking Active   L-Lysine 1000 MG TABS [id number] No Take by mouth. As needed [provider] Taking Active   Lactobacillus (PROBIOTIC ACIDOPHILUS PO) [id number] No Take by mouth daily. Digestive Advantage BC   bacillus coagulans [provider] Taking Active   levocetirizine (XYZAL) 5 MG tablet [id number] No TAKE 1 TABLET BY MOUTH EVERY DAY IN THE Fredric Mare, MD Taking Active   LORazepam (ATIVAN) 1 MG tablet [id number] No Take 1 tablet (1 mg total) by mouth at bedtime. Sater, Nanine Means, MD Taking Active   Lutein-Bilberry (BILBERRY PLUS LUTEIN PO) [id number] No Take by mouth daily. [provider] Taking Active   Melatonin 10 MG TABS [id number] No Take 1 tablet by mouth at bedtime.  [provider] Taking Active Self  methylphenidate (CONCERTA) 27 MG PO CR tablet [id number]  Take 1 tablet (27 mg total) by mouth every morning. Sater, Nanine Means, MD  Active   methylphenidate (RITALIN) 10 MG tablet [id number]  Take 2 tablets (20 mg total) by mouth every morning. Sater, Nanine Means, MD  Active   montelukast (SINGULAIR) 10 MG tablet [id number]  Take 1 tablet (10 mg total) by mouth daily. [name], MD  Active   Moringa 500 MG CAPS [id number] No Take 2 capsules by mouth daily. [provider] Taking Active   Multiple Vitamins-Minerals (HAIR/SKIN/NAILS/BIOTIN) TABS [id number] No Take by mouth daily.  [provider] Taking Active   Nutritional Supplements (NUTRITIONAL SUPPLEMENT PO) [id number] No Take 2.1 g by mouth in the morning and at bedtime. LION'S MANE [provider] Taking Active   nystatin-triamcinolone ointment [facility]) [id number] No Apply 1 application topically 2 (two) times daily. Minette Brine, FNP Taking Active   promethazine-dextromethorphan (PROMETHAZINE-DM) 6.25-15 MG/5ML syrup [id number] No Take 5 mLs by mouth 4 (four) times daily as needed for cough. [name], MD Taking Active   rizatriptan  (MAXALT-MLT) 10 MG disintegrating tablet [id number] No Take 1 tablet (10 mg total) by mouth as needed for migraine. May repeat in 2 hours if needed.  Max of 2 tablets in 24 hours, and 10 tablets per month Sater, Nanine Means, MD Taking Active   tiZANidine (ZANAFLEX) 2 MG tablet [id number]  TAKE UP TO 4 TABLETS BY MOUTH EVERY DAY Sater, Nanine Means, MD  Active   Vitamin D, Ergocalciferol, (DRISDOL) 1.25 MG (50000 UNIT) CAPS capsule [id number] No ONE CAPSULE TWICE WEEKLY ON TUESDAYS/FRIDAYS [name], MD Taking Active   vitamin E 1000 UNIT capsule [id number] No vitamin E 670 mg (1,000 unit) capsule   1 capsule every day by oral route. [provider] Taking Active   zolpidem (AMBIEN) 10 MG tablet [id number] No Take 10 mg by mouth at bedtime as needed. For sleep [provider] Taking Active Self            Patient Active Problem List   Diagnosis Date Noted   Recurrent major depressive disorder, in full remission ([place]) 07/20/2021   Influenza vaccination declined 05/16/2021   New onset headache  05/16/2021   Urinary urgency 05/16/2021   Grief 05/16/2021   ADD (attention deficit disorder) without hyperactivity 12/23/2020   Chronic prescription opiate use 12/17/2020   Numbness 12/17/2020   Arthritis 11/26/2020   Irritable bowel syndrome 11/26/2020   At risk for heart disease 04/15/2020   Other hyperlipidemia with hypertriglyceridemia 04/15/2020   Cystoid macular edema of right eye 03/25/2020   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   Secondary open-angle glaucoma of right eye, 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   Prediabetes 01/01/2020   Vitamin D deficiency 01/01/2020   Vitamin B 12 deficiency 01/01/2020   At risk for diabetes mellitus 01/01/2020   Class 2 severe obesity due to excess calories with serious comorbidity and body mass index (BMI) of 35.0 to 35.9 in adult ([place]) 01/01/2020   Anosmia 11/15/2019   Disturbance of smell and taste 11/15/2019   Epiretinal membrane,  right 07/03/2019   Depression 03/06/2019   Diverticulosis 06/14/2018   Hepatic steatosis 06/14/2018   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   Decreased pedal pulses 06/30/2016   Pain in both lower extremities 06/30/2016   Facial pain, atypical 06/08/2016   Fibroids 02/12/2016   Chest pain 07/07/2015   Dizziness and giddiness 07/07/2015   Leiomyoma of uterus 07/07/2015   Lower abdominal pain 07/07/2015   Hip pain 03/03/2015   Optic neuritis 06/27/2014   Multiple sclerosis ([place]) 06/26/2014   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/26/2014   Other fatigue 06/26/2014   Dysesthesia 06/26/2014   Depression with anxiety 06/26/2014   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   Insomnia 06/26/2014   Low serum vitamin D 06/26/2014    Immunization History  Administered Date(s) Administered   Tdap 03/21/2012, 04/16/2020    Conditions to be addressed/monitored:  Depression and Vitamin D Deficiency   Care Plan : [place]  Updates made by Mayford Knife, RPH since 11/20/2021 12:00 AM     Problem: Vitamin D Deficiency, Depression   Priority: High     Long-Range Goal: Disease Management   Recent Progress: On track  Note:     Current Barriers:  Unable to independently monitor therapeutic efficacy  Pharmacist Clinical Goal(s):  Patient will achieve adherence to monitoring guidelines and medication adherence to achieve therapeutic efficacy through collaboration with PharmD and provider.   Interventions: 1:1 collaboration with [name], MD regarding development and update of comprehensive plan of care as evidenced by provider attestation and co-signature Inter-disciplinary care team collaboration (see longitudinal plan of care) Comprehensive medication review performed; medication list updated in electronic medical record  Depression  (Goal: Reduce signs and symptoms of depression) -Controlled -Current treatment: Doxepin 10 mg capsule once per day at bedtime. Appropriate, Effective, Safe,  Accessible Lorazepam 1 mg tablet at bedtime. Appropriate, Effective, Safe, Accessible -PHQ9: did not do with patient during this visit -Connected with no one presently for mental health support -Patient reports that she has not been seen by a therapist for depression, she was sent to a counselor at one time, for Hospice counseling. She has since aged out of that counseling.  -Educated on Benefits of medication for symptom control Benefits of cognitive-behavioral therapy with or without medication -Recommended to continue current medication  Vitamin D Deficiency  (Goal: Normal Levels ) -Controlled -Current treatment  Vitamin D 50, 000 units on Tuesdays and Fridays. Appropriate, Effective, Safe, Accessible -Medications previously tried: None noted   -Recommended to continue current medication  Health Maintenance -Vaccine gaps: COVID-19 Vaccine  -Current therapy:  Azo-Cranberry Appropriate, Query Effective Black Elderberry Appropriate, Query Effective Advanced Evening Primrose Oil  Appropriate, Query Effective Cinnamon PO Appropriate, Query Effective Flaxseed Appropriate, Query Effective L-Lysine Appropriate, Query Effective Lactobacillus Appropriate, Query Effective Lutein-Bilberry Appropriate, Query Effective Melatonin Appropriate, Query Effective Moringa Appropriate, Query Effective Hair/Skin/Nails MV Appropriate, Query Effective Vitamin D Appropriate, Query Effective Vitamin E Appropriate, Query Effective -Educated on Herbal supplement research is limited and benefits usually cannot be proven Cost vs benefit of each product must be carefully weighed by individual consumer Supplements may interfere with prescription drugs  -Patient is satisfied with current therapy and denies issues -Counseled on the risks outweighing the benefits, at this time patient reports that   Patient Goals/Self-Care Activities Patient will:  - take medications as prescribed as evidenced by patient  report and record review  Follow Up Plan: The patient has been provided with contact information for the care management team and has been advised to call with any health related questions or concerns.       Medication Assistance: None required.  Patient affirms current coverage meets needs.  Compliance/Adherence/Medication fill history: Care Gaps: COVID-19 Vaccine   Star-Rating Drugs: None noted   Patient's [facility] pharmacy [facility]:  [id number] [place] [place] [address]Phone: [phone]: [phone] [facility] [place] [facility] [facility] [address] [id number]: [phone]: [phone] Uses pill box? No - patient keeps her medications in a bottle Pt endorses 80% compliance  We discussed: Benefits of medication synchronization, packaging and delivery as well as enhanced pharmacist oversight with [employer]. Patient decided to: Continue current medication management strategy  Care Plan and Follow Up Patient Decision:  Patient agrees to Care Plan and Follow-up.  Plan: The patient has been provided with contact information for the care management team and has been advised to call with any health related questions or concerns.   VOrlando Penner CPP, PharmD Clinical Pharmacist Practitioner [facility] [phone]   Current Barriers:  Unable to independently monitor therapeutic efficacy  Pharmacist Clinical Goal(s):  Patient will achieve adherence to monitoring guidelines and medication adherence to achieve therapeutic efficacy through collaboration with PharmD and provider.   Interventions: 1:1 collaboration with SGlendale Chard MD regarding development and update of comprehensive plan of care as evidenced by provider attestation and co-signature Inter-disciplinary care team  collaboration (see longitudinal plan of care) Comprehensive medication review performed; medication list updated in electronic medical record  Depression  (Goal: Reduce signs and symptoms of depression) -Controlled -Current treatment: Doxepin 10 mg capsule once per day at bedtime. Appropriate, Effective, Safe, Accessible Lorazepam 1 mg tablet at bedtime. Appropriate, Effective, Safe, Accessible -PHQ9: did not do with patient during this visit -Connected with no one presently for mental health support -Patient reports that she has not been seen by a therapist for depression, she was sent to a counselor at one time, for Hospice counseling. She has since aged out of that counseling.  -Educated on Benefits of medication for symptom control Benefits of cognitive-behavioral therapy with or without medication -Recommended to continue current medication  Vitamin D Deficiency  (Goal: Normal Level ) -Controlled -Current treatment  Vitamin D 50, 000 units on Tuesdays and Fridays.  -Medications previously tried: None noted   -Recommended to continue current medication  Health Maintenance -Vaccine gaps: COVID-19 Vaccine  -Current therapy:  Azo-Cranberry  Black Elderberry  Advanced Evening Primrose Oil Cinnamon PO Flaxseed L-Lysine Lactobacillus Lutein-Bilberry Melatonin Moringa Hair/Skin/Nails MV Vitamin  D Vitamin E -Educated on Herbal supplement research is limited and benefits usually cannot be proven Cost vs benefit of each product must be carefully weighed by individual consumer Supplements may interfere with prescription drugs  -Patient is satisfied with current therapy and denies issues -Counseled on the risks outweighing the benefits, at this time patient reports that   Patient Goals/Self-Care Activities Patient will:  - take medications as prescribed as evidenced by patient report and record review  Follow Up Plan: The patient has been provided with contact information for  the care management team and has been advised to call with any health related questions or concerns.

## 2021-11-20 NOTE — Patient Instructions (Signed)
Visit Information It was great speaking with you today!  Please let me know if you have any questions about our visit.   Goals Addressed             This Visit's Progress    Manage My Medicine       Timeframe:  Long-Range Goal Priority:  High Start Date:                             Expected End Date:                       Follow Up Date 11/16/2022   In Progress: - call for medicine refill 2 or 3 days before it runs out - call if I am sick and can't take my medicine - keep a list of all the medicines I take; vitamins and herbals too - use a pillbox to sort medicine    Why is this important?   These steps will help you keep on track with your medicines.   Notes:  Recommend patient call with any questions        Patient Care Plan: CCM Pharmacy Care Plan     Problem Identified: Vitamin D Deficiency, Depression   Priority: High     Long-Range Goal: Disease Management   Recent Progress: On track  Note:     Current Barriers:  Unable to independently monitor therapeutic efficacy  Pharmacist Clinical Goal(s):  Patient will achieve adherence to monitoring guidelines and medication adherence to achieve therapeutic efficacy through collaboration with PharmD and provider.   Interventions: 1:1 collaboration with [name], MD regarding development and update of comprehensive plan of care as evidenced by provider attestation and co-signature Inter-disciplinary care team collaboration (see longitudinal plan of care) Comprehensive medication review performed; medication list updated in electronic medical record  Depression  (Goal: Reduce signs and symptoms of depression) -Controlled -Current treatment: Doxepin 10 mg capsule once per day at bedtime. Appropriate, Effective, Safe, Accessible Lorazepam 1 mg tablet at bedtime. Appropriate, Effective, Safe, Accessible -PHQ9: did not do with patient during this visit -Connected with no one presently for mental health  support -Patient reports that she has not been seen by a therapist for depression, she was sent to a counselor at one time, for Hospice counseling. She has since aged out of that counseling.  -Educated on Benefits of medication for symptom control Benefits of cognitive-behavioral therapy with or without medication -Recommended to continue current medication  Vitamin D Deficiency  (Goal: Normal Levels ) -Controlled -Current treatment  Vitamin D 50, 000 units on Tuesdays and Fridays. Appropriate, Effective, Safe, Accessible -Medications previously tried: None noted   -Recommended to continue current medication  Health Maintenance -Vaccine gaps: COVID-19 Vaccine  -Current therapy:  Azo-Cranberry Appropriate, Query Effective Black Elderberry Appropriate, Query Effective Advanced Evening Primrose Oil Appropriate, Query Effective Cinnamon PO Appropriate, Query Effective Flaxseed Appropriate, Query Effective L-Lysine Appropriate, Query Effective Lactobacillus Appropriate, Query Effective Lutein-Bilberry Appropriate, Query Effective Melatonin Appropriate, Query Effective [place]a Appropriate, Query Effective Hair/Skin/Nails MV Appropriate, Query Effective Vitamin D Appropriate, Query Effective Vitamin E Appropriate, Query Effective -Educated on Herbal supplement research is limited and benefits usually cannot be proven Cost vs benefit of each product must be carefully weighed by individual consumer Supplements may interfere with prescription drugs  -Patient is satisfied with current therapy and denies issues -Counseled on the risks outweighing the benefits, at this time patient reports that  Patient Goals/Self-Care Activities Patient will:  - take medications as prescribed as evidenced by patient report and record review  Follow Up Plan: The patient has been provided with contact information for the care management team and has been advised to call with any health related questions or  concerns.         Patient agreed to services and verbal consent obtained.   The patient verbalized understanding of instructions, educational materials, and care plan provided today and agreed to receive a mailed copy of patient instructions, educational materials, and care plan.   [name], PharmD Clinical Pharmacist [facility] [phone]

## 2021-12-07 DIAGNOSIS — F3342 Major depressive disorder, recurrent, in full remission: Secondary | ICD-10-CM | POA: Diagnosis not present

## 2021-12-07 DIAGNOSIS — F418 Other specified anxiety disorders: Secondary | ICD-10-CM

## 2021-12-09 ENCOUNTER — Encounter: Payer: Self-pay | Admitting: Neurology

## 2021-12-10 ENCOUNTER — Other Ambulatory Visit: Payer: Self-pay | Admitting: Neurology

## 2021-12-10 MED ORDER — RIZATRIPTAN BENZOATE 10 MG PO TBDP: 10.0000 mg | ORAL_TABLET | ORAL | 11 refills | Status: DC | PRN

## 2021-12-16 ENCOUNTER — Encounter: Payer: Self-pay | Admitting: Internal Medicine

## 2021-12-16 ENCOUNTER — Encounter (INDEPENDENT_AMBULATORY_CARE_PROVIDER_SITE_OTHER): Payer: Self-pay

## 2021-12-16 ENCOUNTER — Encounter: Payer: Self-pay | Admitting: Family Medicine

## 2021-12-16 ENCOUNTER — Ambulatory Visit (INDEPENDENT_AMBULATORY_CARE_PROVIDER_SITE_OTHER): Payer: Medicare Other | Admitting: Family Medicine

## 2021-12-16 VITALS — BP 136/87 | HR 86 | Wt 220.5 lb

## 2021-12-16 DIAGNOSIS — Z79891 Long term (current) use of opiate analgesic: Secondary | ICD-10-CM

## 2021-12-16 DIAGNOSIS — G35 Multiple sclerosis: Secondary | ICD-10-CM | POA: Diagnosis not present

## 2021-12-16 DIAGNOSIS — R208 Other disturbances of skin sensation: Secondary | ICD-10-CM

## 2021-12-16 DIAGNOSIS — F418 Other specified anxiety disorders: Secondary | ICD-10-CM

## 2021-12-16 DIAGNOSIS — G4700 Insomnia, unspecified: Secondary | ICD-10-CM

## 2021-12-16 DIAGNOSIS — F988 Other specified behavioral and emotional disorders with onset usually occurring in childhood and adolescence: Secondary | ICD-10-CM | POA: Diagnosis not present

## 2021-12-16 DIAGNOSIS — R5383 Other fatigue: Secondary | ICD-10-CM

## 2021-12-16 MED ORDER — ONDANSETRON 8 MG PO TBDP: 8.0000 mg | ORAL_TABLET | Freq: Three times a day (TID) | ORAL | 0 refills | Status: DC | PRN

## 2021-12-16 MED ORDER — PREDNISONE 10 MG (21) PO TBPK: ORAL_TABLET | ORAL | 0 refills | Status: DC

## 2021-12-16 NOTE — Patient Instructions (Signed)
Below is our plan:  We will continue current treatment plan. I would recommend we try a steroid taper for the occipital pain. If you wish, increase gabapentin to twice daily for 1-2 weeks. We will call in Zofran '8mg'$  as needed every 8 hours. Avoid regular usage as this can cause constipation.   Please monitor symptoms closely. If you have any worsening or any new symptoms, I recommend going to the ER for eval. If you change you mind about MRI let me know and we can order.   Please make sure you are staying well hydrated. I recommend 50-60 ounces daily. Well balanced diet and regular exercise encouraged. Consistent sleep schedule with 6-8 hours recommended.   Please continue follow up with care team as directed.   Follow up with Dr Felecia Shelling in 6 months, sooner if needed   You may receive a survey regarding today's visit. I encourage you to leave honest feed back as I do use this information to improve patient care. Thank you for seeing me today!

## 2021-12-16 NOTE — Progress Notes (Signed)
PATIENT: Grace [name] DOB: Nov 08, 1960  REASON FOR VISIT: follow up HISTORY FROM: patient  Chief Complaint  Patient presents with   Follow-up    Rm 15, alone. Here to f/u for MS and migraine. Pt off DMT for MS. Pt reports having migraines every day. Per pt has nausea and vision problems. Pt reports having a pulling sensation on the back left side of head. Having trouble sleeping at times. Continues to have numbness/tingling in hands and feet. Taking gabapentin 1x a day.    HISTORY OF PRESENT ILLNESS:  12/16/21 ALL: Grace [name] is a 61 y.o. female here today for follow up for MS. She is not currently on DMT. MRI brain 05/2021 was stable with no new lesions compared to 2019. MRI cervical spine did show 1 new focus and two older lesions in 07/2020.   She reports that she hasn't felt well for the past two weeks she has had new symptoms. She describes a burning sensation of left side of her head at base of neck. Pain is constant but seems better in the mornings. She has had migraines in the past but this feels different. Headaches does not respond to gabapentin, rizatriptan, baclofen, tizanidine or Norco. She has nausea late in the afternoons. She reports blurry vision bilaterally that is worse in the evening. She was seen by ophthalmology about 2 weeks before pain started and exam was reportedly normal.   She continues gabapentin '300mg'$  daily-TID as needed, Norco 5-325 TID PRN and tizanidine '2mg'$  up to QID as needed for pain. Baclofen at night to help with RLS. Dysesthesias in hands and feet are stable.   She continues Concerta for fatigue and inattention. She does feel that it helps. No significant change in headaches. She uses rizatriptan for abortive therapy. She may have a migraine 4-5 times a year.   She continues to have difficulty sleeping. She has difficulty getting to and staying asleep. She is taking doxepin 10-'20mg'$  QHS. She does not sleep well. She sleeps a hour or two at time.  Ambien not used often due to concerns of side effects.   She denies changes in gait, no weakness, no changes in bowel or bladder habits.    HISTORY: (copied from Dr Grace [name] previous note)  Update 08/12/2021: She is off a  DMT for MS.  She is neurologically stable.    She has had an URI since yesterday, prompting change to video visit.   She haas no recent fall.  She stumbles some and uses the rails on stairs.   She notes mild weakness- reduced grips but nothing new.  She feels weaker with heat.   She has tingling in her arms and feet.. She has urinary urgency which is worse with cough/sneeze sometimes causing incontinence..    Her gait is mildly off balance but no falls.  She continues to experience some dysesthesias especially in the hands.    She has pain in the neck, back.  She gets some benefit from gabapentin and hydrocodone.  She has a mild limp now  She continues to have vision problems since cataract surgery and complications with elevated IOP (R>L)  She is sleeping poorly despite hydrocodone, baclofen, doxepin, melatonin, benadryl at night.  She sometimes take lorazepam at bedtime.   She rarely takes Ambien    MS History:   She had transverse myelitis in 1998 and was found to have a cervical cord plaque . She had optic neuritis in 1999. In 2002, she had  right-sided numbness. MRIs of the brain show just a couple of nonspecific foci. A cervical spine MRI showed a focus consistent with demyelination at C5. Additionally, there are 2 smaller foci at T2 and T3-T4.  CSF was also abnormal c/w MS.     She was initially placed on Betaseron and then on Copaxone. She was unable to tolerate either of these and has not been on any DMT medication since 2003.   She retried Betaseron but stopped after diagnosis of avascular necrosis requiring hip replacement.  She was on Copaxone for a while.   Of note, she had had only one course of IV steroids prior to that and has had some afterwards.   She had not  wanted to go on Tysabri or Gilenya and opted to go on no disease modifying therapy since 2012. At her last visit on 01/17/2014, she decided to go on Aubagio but due to elevated liver function test stopped..     Most recent MRIs are from earlier this year and showed no changes.  She has been off of all disease modifying therapy since 2018.  02/05/2018: Brain MRI showed "Multiple T2/FLAIR hyperintense foci in the hemispheres.  This is a nonspecific finding and could be due to chronic microvascular ischemic changes or 2 demyelination.  None of the foci appears to be acute.  When compared to the MRI dated 07/03/2016, there is no interval change.  There is a normal enhancement pattern.   REVIEW OF SYSTEMS: Out of a complete 14 system review of symptoms, the patient complains only of the following symptoms, weakness, numbness, headaches, occipital pain and all other reviewed systems are negative.  ALLERGIES: Allergies  Allergen Reactions   Diclofenac Anaphylaxis   Lamictal [Lamotrigine] Shortness Of Breath, Itching and Swelling    Tongue swells   Mobic [Meloxicam] Anaphylaxis   Ultram [Tramadol Hcl] Anaphylaxis   Nefazodone Itching and Swelling    Other reaction(s): Arthralgia (Joint Pain); Throat swelling, mouth sores   Betaseron [Interferon Beta-1b] Other (See Comments)    Caused avascular necrosis   Codeine Itching   Copaxone [Glatiramer Acetate] Other (See Comments)    Altered mental status: mental changes, anxiety, motion sickness   Gabapentin Itching    Itching if she takes more than '300mg'$    Latex     Latex band-aids cause redness and tears your skin   Nitrofurantoin     Causing itching all over, tongue swelling, chest heaviness, swollen lips, cough, rapid HR, swollen eyelids, red eyes/gums, lips red/white per pt.   Oxcarbazepine Itching and Other (See Comments)    Dizziness, throat hurts   Teriflunomide Diarrhea    Hair loss, joint pains, elevated liver enzymes abugio (joint pains and  liver issues)   Trazodone And Nefazodone     Mouth sores, tongue swelling   Tape Rash    HOME MEDICATIONS: Outpatient Medications Prior to Visit  Medication Sig Dispense Refill   ADVANCED EVENING PRIMROSE OIL PO Take 1 capsule by mouth daily. Total 1300 mg     Ascorbic Acid (VITAMIN C) 1000 MG tablet Take 1,000 mg by mouth daily.     aspirin EC 81 MG tablet Take 81 mg by mouth daily.     AZO-CRANBERRY PO Take 1 capsule by mouth daily at 12 noon.     baclofen (LIORESAL) 10 MG tablet TAKE 2 TABLETS BY MOUTH 3 TIMES DAILY. 540 tablet 3   BLACK ELDERBERRY PO Take 300 mg by mouth in the morning and at bedtime.  Cholecalciferol (VITAMIN D) 50 MCG (2000 UT) CAPS Take 2,000 Units by mouth daily.      CINNAMON PO Take 1,200 mg by mouth daily.      diphenhydrAMINE (BENADRYL) 25 MG tablet Take 25 mg by mouth 3 (three) times daily. Takes with Hydrocodone     doxepin (SINEQUAN) 10 MG capsule Take 1-2 capsules (10-20 mg total) by mouth at bedtime. 180 capsule 1   famotidine (PEPCID) 20 MG tablet TAKE 1 TABLET BY MOUTH TWICE A DAY 180 tablet 1   fexofenadine (ALLEGRA) 180 MG tablet Take 1 tablet (180 mg total) by mouth daily. 90 tablet 2   Flaxseed, Linseed, (FLAX SEED OIL) 1000 MG CAPS Take 1,200 mg by mouth daily.      gabapentin (NEURONTIN) 300 MG capsule Take 1 capsule (300 mg total) by mouth 3 (three) times daily. 270 capsule 1   HYDROcodone-acetaminophen (NORCO/VICODIN) 5-325 MG tablet Take 1 tablet by mouth 3 (three) times daily as needed for moderate pain. 270 tablet 0   ipratropium (ATROVENT) 0.06 % nasal spray Use 2 sprays each nostril bid prn drainage 30 mL 5   L-Lysine 1000 MG TABS Take by mouth. As needed     Lactobacillus (PROBIOTIC ACIDOPHILUS PO) Take by mouth daily. Digestive Advantage BC   bacillus coagulans     levocetirizine (XYZAL) 5 MG tablet TAKE 1 TABLET BY MOUTH EVERY DAY IN THE EVENING 90 tablet 2   LORazepam (ATIVAN) 1 MG tablet Take 1 tablet (1 mg total) by mouth at  bedtime. 30 tablet 5   Lutein-Bilberry (BILBERRY PLUS LUTEIN PO) Take by mouth daily.     Melatonin 10 MG TABS Take 1 tablet by mouth at bedtime.      methylphenidate (CONCERTA) 27 MG PO CR tablet Take 1 tablet (27 mg total) by mouth every morning. 90 tablet 0   montelukast (SINGULAIR) 10 MG tablet Take 1 tablet (10 mg total) by mouth daily. 90 tablet 0   Moringa 500 MG CAPS Take 2 capsules by mouth daily.     Multiple Vitamins-Minerals (HAIR/SKIN/NAILS/BIOTIN) TABS Take by mouth daily.      Nutritional Supplements (NUTRITIONAL SUPPLEMENT PO) Take 2.1 g by mouth in the morning and at bedtime. LION'S MANE     nystatin-triamcinolone ointment (MYCOLOG) Apply 1 application topically 2 (two) times daily. 30 g 2   rizatriptan (MAXALT-MLT) 10 MG disintegrating tablet Take 1 tablet (10 mg total) by mouth as needed for migraine. May repeat in 2 hours if needed.  Max of 2 tablets in 24 hours, and 10 tablets per month 10 tablet 11   tiZANidine (ZANAFLEX) 2 MG tablet TAKE UP TO 4 TABLETS BY MOUTH EVERY DAY 360 tablet 1   Vitamin D, Ergocalciferol, (DRISDOL) 1.25 MG (50000 UNIT) CAPS capsule ONE CAPSULE TWICE WEEKLY ON TUESDAYS/FRIDAYS 24 capsule 2   vitamin E 1000 UNIT capsule vitamin E 670 mg (1,000 unit) capsule   1 capsule every day by oral route.     zolpidem (AMBIEN) 10 MG tablet Take 10 mg by mouth at bedtime as needed. For sleep     No facility-administered medications prior to visit.    PAST MEDICAL HISTORY: Past Medical History:  Diagnosis Date   Allergy    Anxiety    Avascular necrosis (HCC)    Back pain    Cancer (HCC)    Chest pain    Chewing difficulty    Chronic fatigue syndrome    Colon polyp    pre-cancerous   Complication of  anesthesia    patient states she requires more anesthesia   Depression    Disorder of soft tissue    Diverticulosis    Dizziness    Edema, lower extremity    Fatty liver    Fibromyalgia    GERD (gastroesophageal reflux disease)    H/O breast biopsy     pre- cancerous cells   Hand fracture, left    9/87   Heart palpitations    Heart valve disease    History of Holter monitoring    Hyperlipidemia    Insomnia    Joint pain    Lactose intolerance    Lhermitte's syndrome    Migraines    Multiple food allergies    Multiple sclerosis (HCC)    dx in 11/1996   Multiple sclerosis (HCC)    Neuromuscular disorder (HCC)    Neuropathy    Numbness and tingling in hands    Obesity    Optic neuritis    OSA (obstructive sleep apnea)    no longer have OSA since stopped taking a MS medication   Osteoarthritis    Otitis media    Palpitations    Prediabetes    Raynaud's phenomenon    Shortness of breath    Sleep apnea    Due to medication that she no longer takes.   Swallowing difficulty    Vaginitis and vulvovaginitis    Vitamin B12 deficiency    Vitamin D deficiency     PAST SURGICAL HISTORY: Past Surgical History:  Procedure Laterality Date   ABDOMINAL HYSTERECTOMY Bilateral 02/12/2016   Procedure: HYSTERECTOMY ABDOMINAL WITH BILATERAL SALPINGO OOPHERECTOMY;  Surgeon: Dian Queen, MD;  Location: [facility];  Service: Gynecology;  Laterality: Bilateral;   BREAST EXCISIONAL BIOPSY     core/left breast   CATARACT EXTRACTION, BILATERAL  06/2019   CHOLECYSTECTOMY  1983   COLONOSCOPY     1997/2001   DILATION AND CURETTAGE OF UTERUS     ENDOMETRIAL ABLATION  2012   LAPAROSCOPIC ABDOMINAL EXPLORATION     ovaries and intestines bond together   LEFT HEART CATHETERIZATION WITH CORONARY ANGIOGRAM N/A 06/21/2011   Procedure: LEFT HEART CATHETERIZATION WITH CORONARY ANGIOGRAM;  Surgeon: Leonie Man, MD;  Location: [facility];  Service: Cardiovascular;  Laterality: N/A;   LUMBAR PUNCTURE     11/01/1996   SALPINGOOPHORECTOMY Bilateral 02/12/2016   Procedure: BILATERAL SALPINGO OOPHORECTOMY;  Surgeon: Dian Queen, MD;  Location: [facility];  Service: Gynecology;  Laterality: Bilateral;   TOTAL HIP ARTHROPLASTY     left hip  6/02  /right  hip12/04    FAMILY HISTORY: Family History  Problem Relation Age of Onset   Pancreatic cancer Mother    Stroke Mother    Hypertension Mother    Hyperlipidemia Mother    Heart disease Mother    Cancer Mother    Depression Mother    Anxiety disorder Mother    Alcoholism Mother    Testicular cancer Brother    Colon cancer Maternal Uncle    Pancreatic cancer Paternal Uncle    Prostate cancer Maternal Grandfather    Colon polyps Father    Diabetes Father    Hypertension Father    Hyperlipidemia Father    Kidney disease Father    Sleep apnea Father    Obesity Father     SOCIAL HISTORY: Social History   Socioeconomic History   Marital status: Widowed    Spouse name: Not on file   Number of children: Not on  file   Years of education: Not on file   Highest education level: Not on file  Occupational History   Occupation: disabled  Tobacco Use   Smoking status: Former    Packs/day: 0.25    Years: 8.00    Total pack years: 2.00    Types: Cigarettes    Quit date: 07/01/1995    Years since quitting: 26.4   Smokeless tobacco: Never  Vaping Use   Vaping Use: Never used  Substance and Sexual Activity   Alcohol use: No    Alcohol/week: 0.0 standard drinks of alcohol   Drug use: Yes    Types: Hydrocodone   Sexual activity: Not Currently  Other Topics Concern   Not on file  Social History Narrative   Not on file   Social Determinants of Health   Financial Resource Strain: Low Risk  (10/21/2021)   Overall Financial Resource Strain (CARDIA)    Difficulty of Paying Living Expenses: Not hard at all  Food Insecurity: No Food Insecurity (10/21/2021)   Hunger Vital Sign    Worried About Running Out of Food in the Last Year: Never true    Ran Out of Food in the Last Year: Never true  Transportation Needs: No Transportation Needs (10/21/2021)   PRAPARE - Hydrologist (Medical): No    Lack of Transportation (Non-Medical): No  Physical Activity:  Sufficiently Active (10/21/2021)   Exercise Vital Sign    Days of Exercise per Week: 7 days    Minutes of Exercise per Session: 40 min  Stress: No Stress Concern Present (10/21/2021)   [place]    Feeling of Stress : Not at all  Social Connections: Not on file  Intimate Partner Violence: Not At Risk (04/27/2018)   Humiliation, Afraid, Rape, and Kick questionnaire    Fear of Current or Ex-Partner: No    Emotionally Abused: No    Physically Abused: No    Sexually Abused: No      PHYSICAL EXAM  Vitals:   12/16/21 1413  BP: 136/87  Pulse: 86  Weight: 220 lb 8 oz (100 kg)     Body mass index is 35.37 kg/m.  Generalized: Well developed, in no acute distress  Cardiology: normal rate and rhythm, no murmur noted Neurological examination  Mentation: Alert oriented to time, place, history taking. Follows all commands speech and language fluent Cranial nerve II-XII: Pupils were equal round reactive to light. Extraocular movements were full, visual field were full on confrontational test. Facial sensation and strength were normal. Head turning and shoulder shrug  were normal and symmetric. Motor: The motor testing reveals 5 over 5 strength of all 4 extremities. Good symmetric motor tone is noted throughout.  Sensory: Sensory testing is intact to soft touch on all 4 extremities. No evidence of extinction is noted.  Coordination: Cerebellar testing reveals good finger-nose-finger and heel-to-shin not performed due to being in boot.  Gait and station: Gait is normal.    DIAGNOSTIC DATA (LABS, IMAGING, TESTING) - I reviewed patient records, labs, notes, testing and imaging myself where available.      No data to display           Lab Results  Component Value Date   WBC 6.3 07/20/2021   HGB 13.8 07/20/2021   HCT 40.4 07/20/2021   MCV 92 07/20/2021   PLT 298 07/20/2021      Component Value Date/Time   NA 142  07/20/2021 1539   K 4.5 07/20/2021 1539   CL 105 07/20/2021 1539   CO2 21 07/20/2021 1539   GLUCOSE 92 07/20/2021 1539   GLUCOSE 120 (H) 02/13/2016 0459   BUN 18 07/20/2021 1539   CREATININE 0.64 07/20/2021 1539   CALCIUM 9.6 07/20/2021 1539   PROT 7.2 10/21/2021 1542   ALBUMIN 4.4 10/21/2021 1542   AST 55 (H) 10/21/2021 1542   ALT 60 (H) 10/21/2021 1542   ALKPHOS 117 10/21/2021 1542   BILITOT 0.4 10/21/2021 1542   GFRNONAA 96 04/16/2020 1635   GFRAA 110 04/16/2020 1635   Lab Results  Component Value Date   CHOL 172 10/21/2021   HDL 66 10/21/2021   LDLCALC 78 10/21/2021   TRIG 166 (H) 10/21/2021   CHOLHDL 2.6 10/21/2021   Lab Results  Component Value Date   HGBA1C 5.6 04/16/2020   Lab Results  Component Value Date   VHQIONGE95 284 10/21/2021   Lab Results  Component Value Date   TSH 1.530 05/20/2021     ASSESSMENT AND PLAN 61 y.o. year old female  has a past medical history of Allergy, Anxiety, Avascular necrosis ([place]), Back pain, Cancer ([place]), Chest pain, Chewing difficulty, Chronic fatigue syndrome, Colon polyp, Complication of anesthesia, Depression, Disorder of soft tissue, Diverticulosis, Dizziness, Edema, lower extremity, Fatty liver, Fibromyalgia, GERD (gastroesophageal reflux disease), H/O breast biopsy, Hand fracture, left, Heart palpitations, Heart valve disease, History of Holter monitoring, Hyperlipidemia, Insomnia, Joint pain, Lactose intolerance, Lhermitte's syndrome, Migraines, Multiple food allergies, Multiple sclerosis (HCC), Multiple sclerosis (HCC), Neuromuscular disorder (HCC), Neuropathy, Numbness and tingling in hands, Obesity, Optic neuritis, OSA (obstructive sleep apnea), Osteoarthritis, Otitis media, Palpitations, Prediabetes, Raynaud's phenomenon, Shortness of breath, Sleep apnea, Swallowing difficulty, Vaginitis and vulvovaginitis, Vitamin B12 deficiency, and Vitamin D deficiency. here with     ICD-10-CM   1. Multiple sclerosis ([place])  G35      2. 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 Chronic prescription opiate use  Z79.891     5. Insomnia, unspecified type  G47.00     6. Depression with anxiety  F41.8     7. Dysesthesia  R20.8       Jerris has been doing well until new onset of left sided occipital dysesthesia about 2 weeks ago. Neuro exam intact. MRI stable 05/2021. We have discussed repeating MRI for eval, however, she is adamant that she is not interested in starting a DMT if she is experiencing a new lesion. I will have her take a prednisone taper and increase gabapentin to BID for the next 1-2 weeks. Zofran as needed for nausea. If symptoms worsen or new symptoms present, she will seek emergent evaluation. She will continue medicaitons as prescribed. She denies need for refills at this time. PDMP shows appropriate refills and no other controlled substances being prescribed. She was encouraged to continue regular activity and stay well hydrated. She will follow up in 6 months with Dr Felecia Shelling, sooner if needed.    No orders of the defined types were placed in this encounter.     Meds ordered this encounter  Medications   predniSONE (STERAPRED UNI-PAK 21 TAB) 10 MG (21) TBPK tablet    Sig: Taper pack as directed    Dispense:  1 each    Refill:  0    Order Specific Question:   Supervising Provider    Answer:   Melvenia Beam [[id number]]  ondansetron (ZOFRAN-ODT) 8 MG disintegrating tablet    Sig: Take 1 tablet (8 mg total) by mouth every 8 (eight) hours as needed for nausea or vomiting.    Dispense:  20 tablet    Refill:  0    Order Specific Question:   Supervising Provider    Answer:   Melvenia Beam [[id number]]       WEX HBZJI, FNP-C 12/16/2021, 3:48 PM [facility] [address] [place], [place] [address] [phone]

## 2021-12-23 ENCOUNTER — Other Ambulatory Visit: Payer: Medicare Other

## 2021-12-23 ENCOUNTER — Other Ambulatory Visit: Payer: Self-pay

## 2021-12-23 DIAGNOSIS — R7989 Other specified abnormal findings of blood chemistry: Secondary | ICD-10-CM

## 2021-12-24 ENCOUNTER — Encounter: Payer: Self-pay | Admitting: Internal Medicine

## 2021-12-24 LAB — HEPATIC FUNCTION PANEL
ALT: 75 IU/L — ABNORMAL HIGH (ref 0–32)
AST: 62 IU/L — ABNORMAL HIGH (ref 0–40)
Albumin: 4.5 g/dL (ref 3.8–4.9)
Alkaline Phosphatase: 103 IU/L (ref 44–121)
Bilirubin Total: 0.6 mg/dL (ref 0.0–1.2)
Bilirubin, Direct: 0.21 mg/dL (ref 0.00–0.40)
Total Protein: 7.2 g/dL (ref 6.0–8.5)

## 2021-12-25 ENCOUNTER — Telehealth: Payer: Self-pay | Admitting: Gastroenterology

## 2021-12-25 ENCOUNTER — Telehealth: Payer: Self-pay

## 2021-12-25 NOTE — Telephone Encounter (Signed)
Pt returned call Explained PREP to pt. Can do [facility] any time after 12N.  Next afternoon class will be 03/08/22 230p-345p Will notify her if I start an earlier class.  Will call her a week prior to class start to do intake interview.

## 2021-12-25 NOTE — Telephone Encounter (Signed)
Left message for patient to call back  

## 2021-12-25 NOTE — Telephone Encounter (Signed)
Patient returned your call. Requesting a call back.

## 2021-12-25 NOTE — Telephone Encounter (Signed)
Call to pt reference referral to PREP  LVMT pt requesting call back to discuss

## 2021-12-25 NOTE — Telephone Encounter (Signed)
Patient called, would like to schedule an appointment to get her liver check. Please call to schedule.

## 2021-12-28 NOTE — Telephone Encounter (Signed)
Patient would like a follow up to discuss elevated LFT's. PCP thinks patient may have a fatty liver. Follow up scheduled with Nevin Bloodgood, NP on 01/27/22 at 2:00 pm.

## 2022-01-09 ENCOUNTER — Other Ambulatory Visit: Payer: Self-pay | Admitting: Neurology

## 2022-01-13 ENCOUNTER — Other Ambulatory Visit: Payer: Self-pay

## 2022-01-13 MED ORDER — GABAPENTIN 300 MG PO CAPS: 300.0000 mg | ORAL_CAPSULE | Freq: Three times a day (TID) | ORAL | 1 refills | Status: DC

## 2022-01-27 ENCOUNTER — Encounter: Payer: Self-pay | Admitting: Nurse Practitioner

## 2022-01-27 ENCOUNTER — Other Ambulatory Visit (INDEPENDENT_AMBULATORY_CARE_PROVIDER_SITE_OTHER): Payer: Medicare Other

## 2022-01-27 ENCOUNTER — Ambulatory Visit (INDEPENDENT_AMBULATORY_CARE_PROVIDER_SITE_OTHER): Payer: Medicare Other | Admitting: Nurse Practitioner

## 2022-01-27 VITALS — BP 130/76 | HR 90 | Ht 66.0 in | Wt 222.0 lb

## 2022-01-27 DIAGNOSIS — R7989 Other specified abnormal findings of blood chemistry: Secondary | ICD-10-CM | POA: Diagnosis not present

## 2022-01-27 LAB — HEPATIC FUNCTION PANEL
ALT: 68 U/L — ABNORMAL HIGH (ref 0–35)
AST: 70 U/L — ABNORMAL HIGH (ref 0–37)
Albumin: 4.3 g/dL (ref 3.5–5.2)
Alkaline Phosphatase: 114 U/L (ref 39–117)
Bilirubin, Direct: 0.1 mg/dL (ref 0.0–0.3)
Total Bilirubin: 0.5 mg/dL (ref 0.2–1.2)
Total Protein: 7.4 g/dL (ref 6.0–8.3)

## 2022-01-27 LAB — IBC + FERRITIN
Ferritin: 103.1 ng/mL (ref 10.0–291.0)
Iron: 133 ug/dL (ref 42–145)
Saturation Ratios: 30.5 % (ref 20.0–50.0)
TIBC: 435.4 ug/dL (ref 250.0–450.0)
Transferrin: 311 mg/dL (ref 212.0–360.0)

## 2022-01-27 LAB — PROTIME-INR
INR: 1 ratio (ref 0.8–1.0)
Prothrombin Time: 11.2 s (ref 9.6–13.1)

## 2022-01-27 NOTE — Patient Instructions (Addendum)
Due to recent changes in healthcare laws, you may see the results of your imaging and laboratory studies on MyChart before your provider has had a chance to review them.  We understand that in some cases there may be results that are confusing or concerning to you. Not all laboratory results come back in the same time frame and the provider may be waiting for multiple results in order to interpret others.  Please give [place] 48 hours in order for your provider to thoroughly review all the results before contacting the office for clarification of your results.   Your provider has requested that you go to the basement level for lab work before leaving today. Press "B" on the elevator. The lab is located at the first door on the left as you exit the elevator.  You have been scheduled for an abdominal ultrasound at [facility] (1st floor of hospital) on 01/28/22 at 8:30 am . Please arrive 15 minutes prior to your appointment for registration. Make certain not to have anything to eat or drink after midnight.  Should you need to reschedule your appointment, please contact radiology at [phone]. This test typically takes about 30 minutes to perform.   Follow low carb diet.   Work on slow weight loss.   Thank you for choosing me and [facility].  Tye Savoy, NP

## 2022-01-27 NOTE — Progress Notes (Signed)
Reviewed and agree plans to evaluate elevated LFTs and with previous recommendation for a 5 year interval colonoscopy due in Jan 2025 for her family history of colon polyps, first degree relative.   Pricilla Riffle. Fuller Plan, MD [facility]

## 2022-01-27 NOTE — Progress Notes (Signed)
Chief Complaint: Abnormal liver test   Assessment &  Plan   # 61 yo female with slowing rising liver enzymes since March. AST / ALT < 3x ULN RUQ [place] Complete hepatic serologic workup and update liver chemistries . Low carb diet. Needs weight loss.  Avoid Etoh ( only drinks once a year anyway).  I asked her to hold some of her supplements for now bilberry, moringa, hair and nail vitamins and [facility]  Will call with lab results and further recommendations.   # History of colon polyps. She tells me that her dad had polyps which ? Required a resection. She had no polyps on her last colonoscopy in 2020 but a 5 year recall colonoscopy was recommended.  HPI   Grace [name] is a 61 y.o. female known to Dr. Fuller Plan with a past medical history significant for MS, remote cholecystectomy, colon polyps, diverticulosis. See PMH /PSH for additional history   Referred by PCP for abnormal liver chemistries.  Patient gives a history of intermittent elevation in liver enzymes which generally has correlated with MS medications.  In Epic liver enzymes were elevated in 2019 and then normalized and she hasn't been on MS medications since   Liver enzymes December 2022 normal Since March 2023 her ALT has slowly risen 35>>60>75.  AST 55>> 62 . Alk phos normalized. Regarding any culprit medications, she started Singular at some point ( she doesn't know when). Changed from Allegra D to plain Allegra at some point but no other medication changes. Rarely drinks Etoh, maybe once a year. No University of liver disease. She has no abdominal pain. Stools are typically loose since remote cholecystectomy  CT AP in 2020 showed marked fatty liver. No imaging since.   Labs:     Latest Ref Rng & Units 07/20/2021    3:39 PM 04/28/2021   10:29 AM 11/22/2019    5:10 PM  CBC  WBC 3.4 - 10.8 x10E3/uL 6.3  5.0  10.3   Hemoglobin 11.1 - 15.9 g/dL 13.8  13.7  13.7   Hematocrit 34.0 - 46.6 % 40.4  40.0  40.3   Platelets 150 - 450  x10E3/uL 298  317  275        Latest Ref Rng & Units 12/23/2021    3:56 PM 10/21/2021    3:42 PM 07/20/2021    3:39 PM  Hepatic Function  Total Protein 6.0 - 8.5 g/dL 7.2  7.2  7.0   Albumin 3.8 - 4.9 g/dL 4.5  4.4  4.5   AST 0 - 40 IU/L 62  55  28   ALT 0 - 32 IU/L 75  60  35   Alk Phosphatase 44 - 121 IU/L 103  117  134   Total Bilirubin 0.0 - 1.2 mg/dL 0.6  0.4  0.3   Bilirubin, Direct 0.00 - 0.40 mg/dL 0.21  0.16     GGt normal at 18  Past Medical History:  Diagnosis Date   Allergy    Anxiety    Avascular necrosis (HCC)    Back pain    Cancer (HCC)    Chest pain    Chewing difficulty    Chronic fatigue syndrome    Colon polyp    pre-cancerous   Complication of anesthesia    patient states she requires more anesthesia   Depression    Disorder of soft tissue    Diverticulosis    Dizziness    Edema, lower extremity    Fatty liver  Fibromyalgia    GERD (gastroesophageal reflux disease)    H/O breast biopsy    pre- cancerous cells   Hand fracture, left    9/87   Heart palpitations    Heart valve disease    History of Holter monitoring    Hyperlipidemia    Insomnia    Joint pain    Lactose intolerance    Lhermitte's syndrome    Migraines    Multiple food allergies    Multiple sclerosis (HCC)    dx in 11/1996   Multiple sclerosis (HCC)    Neuromuscular disorder (HCC)    Neuropathy    Numbness and tingling in hands    Obesity    Optic neuritis    OSA (obstructive sleep apnea)    no longer have OSA since stopped taking a MS medication   Osteoarthritis    Otitis media    Palpitations    Prediabetes    Raynaud's phenomenon    Shortness of breath    Sleep apnea    Due to medication that she no longer takes.   Swallowing difficulty    Vaginitis and vulvovaginitis    Vitamin B12 deficiency    Vitamin D deficiency     Past Surgical History:  Procedure Laterality Date   ABDOMINAL HYSTERECTOMY Bilateral 02/12/2016   Procedure: HYSTERECTOMY ABDOMINAL  WITH BILATERAL SALPINGO OOPHERECTOMY;  Surgeon: Dian Queen, MD;  Location: [facility];  Service: Gynecology;  Laterality: Bilateral;   BREAST EXCISIONAL BIOPSY     core/left breast   CATARACT EXTRACTION, BILATERAL  06/2019   CHOLECYSTECTOMY  1983   COLONOSCOPY     1997/2001   DILATION AND CURETTAGE OF UTERUS     ENDOMETRIAL ABLATION  2012   LAPAROSCOPIC ABDOMINAL EXPLORATION     ovaries and intestines bond together   LEFT HEART CATHETERIZATION WITH CORONARY ANGIOGRAM N/A 06/21/2011   Procedure: LEFT HEART CATHETERIZATION WITH CORONARY ANGIOGRAM;  Surgeon: Leonie Man, MD;  Location[facility] CATH LAB;  Service: Cardiovascular;  Laterality: N/A;   LUMBAR PUNCTURE     11/01/1996   SALPINGOOPHORECTOMY Bilateral 02/12/2016   Procedure: BILATERAL SALPINGO OOPHORECTOMY;  Surgeon: Dian Queen, MD;  Location: [facility];  Service: Gynecology;  Laterality: Bilateral;   TOTAL HIP ARTHROPLASTY     left hip  6/02  /right hip12/04    Current Medications, Allergies, Family History and Social History were reviewed in [employer] record.     Current Outpatient Medications  Medication Sig Dispense Refill   ADVANCED EVENING PRIMROSE OIL PO Take 1 capsule by mouth daily. Total 1300 mg     Ascorbic Acid (VITAMIN C) 1000 MG tablet Take 1,000 mg by mouth daily.     aspirin EC 81 MG tablet Take 81 mg by mouth daily.     AZO-CRANBERRY PO Take 1 capsule by mouth daily at 12 noon.     baclofen (LIORESAL) 10 MG tablet TAKE 2 TABLETS BY MOUTH 3 TIMES DAILY. 540 tablet 3   BLACK ELDERBERRY PO Take 300 mg by mouth in the morning and at bedtime.     Cholecalciferol (VITAMIN D) 50 MCG (2000 UT) CAPS Take 2,000 Units by mouth daily.      CINNAMON PO Take 1,200 mg by mouth daily.      diphenhydrAMINE (BENADRYL) 25 MG tablet Take 25 mg by mouth 3 (three) times daily. Takes with Hydrocodone     doxepin (SINEQUAN) 10 MG capsule TAKE 1-2 CAPSULES (10-20 MG TOTAL) BY MOUTH AT BEDTIME. [place]  capsule 1    famotidine (PEPCID) 20 MG tablet TAKE 1 TABLET BY MOUTH TWICE A DAY 180 tablet 1   fexofenadine (ALLEGRA) 180 MG tablet Take 1 tablet (180 mg total) by mouth daily. 90 tablet 2   Flaxseed, Linseed, (FLAX SEED OIL) 1000 MG CAPS Take 1,200 mg by mouth daily.      gabapentin (NEURONTIN) 300 MG capsule Take 1 capsule (300 mg total) by mouth 3 (three) times daily. 270 capsule 1   HYDROcodone-acetaminophen (NORCO/VICODIN) 5-325 MG tablet Take 1 tablet by mouth 3 (three) times daily as needed for moderate pain. 270 tablet 0   ipratropium (ATROVENT) 0.06 % nasal spray Use 2 sprays each nostril bid prn drainage 30 mL 5   L-Lysine 1000 MG TABS Take by mouth. As needed     Lactobacillus (PROBIOTIC ACIDOPHILUS PO) Take by mouth daily. Digestive Advantage BC   bacillus coagulans     levocetirizine (XYZAL) 5 MG tablet TAKE 1 TABLET BY MOUTH EVERY DAY IN THE EVENING 90 tablet 2   LORazepam (ATIVAN) 1 MG tablet Take 1 tablet (1 mg total) by mouth at bedtime. 30 tablet 5   Lutein-Bilberry (BILBERRY PLUS LUTEIN PO) Take by mouth daily.     Melatonin 10 MG TABS Take 1 tablet by mouth at bedtime.      methylphenidate (CONCERTA) 27 MG PO CR tablet Take 1 tablet (27 mg total) by mouth every morning. 90 tablet 0   montelukast (SINGULAIR) 10 MG tablet Take 1 tablet (10 mg total) by mouth daily. 90 tablet 0   Moringa 500 MG CAPS Take 2 capsules by mouth daily.     Multiple Vitamins-Minerals (HAIR/SKIN/NAILS/BIOTIN) TABS Take by mouth daily.      Nutritional Supplements (NUTRITIONAL SUPPLEMENT PO) Take 2.1 g by mouth in the morning and at bedtime. LION'S MANE     nystatin-triamcinolone ointment (MYCOLOG) Apply 1 application topically 2 (two) times daily. 30 g 2   ondansetron (ZOFRAN-ODT) 8 MG disintegrating tablet Take 1 tablet (8 mg total) by mouth every 8 (eight) hours as needed for nausea or vomiting. 20 tablet 0   rizatriptan (MAXALT-MLT) 10 MG disintegrating tablet Take 1 tablet (10 mg total) by mouth as needed  for migraine. May repeat in 2 hours if needed.  Max of 2 tablets in 24 hours, and 10 tablets per month 10 tablet 11   tiZANidine (ZANAFLEX) 2 MG tablet TAKE UP TO 4 TABLETS BY MOUTH EVERY DAY 360 tablet 1   Vitamin D, Ergocalciferol, (DRISDOL) 1.25 MG (50000 UNIT) CAPS capsule ONE CAPSULE TWICE WEEKLY ON TUESDAYS/FRIDAYS 24 capsule 2   vitamin E 1000 UNIT capsule vitamin E 670 mg (1,000 unit) capsule   1 capsule every day by oral route.     zolpidem (AMBIEN) 10 MG tablet Take 10 mg by mouth at bedtime as needed. For sleep     predniSONE (STERAPRED UNI-PAK 21 TAB) 10 MG (21) TBPK tablet Taper pack as directed 1 each 0   No current facility-administered medications for this visit.    Review of Systems: No chest pain. No shortness of breath. No urinary complaints.    Physical Exam  Wt Readings from Last 3 Encounters:  01/27/22 222 lb (100.7 kg)  12/16/21 220 lb 8 oz (100 kg)  10/21/21 217 lb 13 oz (98.8 kg)    BP 130/76   Pulse 90   Ht 5' 6"  (1.676 m)   Wt 222 lb (100.7 kg)   LMP 04/09/2013   BMI 35.83 kg/m  Constitutional:  Generally well appearing female in no acute distress. Psychiatric: Pleasant. Normal mood and affect. Behavior is normal. EENT: Pupils normal.  Conjunctivae are normal. No scleral icterus. Neck supple.  Cardiovascular: Normal rate, regular rhythm. No edema Pulmonary/chest: Effort normal and breath sounds normal. No wheezing, rales or rhonchi. Abdominal: Soft, nondistended, nontender. Bowel sounds active throughout. There are no masses palpable. No hepatomegaly. Neurological: Alert and oriented to person place and time. Skin: Skin is warm and dry. No rashes noted.  Tye Savoy, NP  01/27/2022, 2:00 PM  Cc:  [name], MD

## 2022-01-28 ENCOUNTER — Ambulatory Visit (HOSPITAL_COMMUNITY)
Admission: RE | Admit: 2022-01-28 | Discharge: 2022-01-28 | Disposition: A | Discharge status: Home / Self Care | Payer: Medicare Other | Source: Ambulatory Visit | Attending: Nurse Practitioner | Admitting: Nurse Practitioner

## 2022-01-28 DIAGNOSIS — R7989 Other specified abnormal findings of blood chemistry: Secondary | ICD-10-CM | POA: Insufficient documentation

## 2022-02-01 LAB — ANTI-NUCLEAR AB-TITER (ANA TITER): ANA Titer 1: 1:80 {titer} — ABNORMAL HIGH

## 2022-02-01 LAB — HEPATITIS B SURFACE ANTIGEN: Hepatitis B Surface Ag: NONREACTIVE

## 2022-02-01 LAB — ANA: Anti Nuclear Antibody (ANA): POSITIVE — AB

## 2022-02-01 LAB — CERULOPLASMIN: Ceruloplasmin: 26 mg/dL (ref 18–53)

## 2022-02-01 LAB — HEPATITIS C ANTIBODY: Hepatitis C Ab: NONREACTIVE

## 2022-02-01 LAB — TISSUE TRANSGLUTAMINASE, IGA: (tTG) Ab, IgA: <1 U/mL

## 2022-02-01 LAB — HEPATITIS A ANTIBODY, TOTAL: Hepatitis A AB,Total: NONREACTIVE

## 2022-02-01 LAB — MITOCHONDRIAL ANTIBODIES: Mitochondrial M2 Ab, IgG: <=20 U (ref <=20.0)

## 2022-02-01 LAB — HEPATITIS B SURFACE ANTIBODY,QUALITATIVE: Hep B S Ab: NONREACTIVE

## 2022-02-01 LAB — HEPATITIS B CORE ANTIBODY, TOTAL: Hep B Core Total Ab: NONREACTIVE

## 2022-02-01 LAB — IGA: Immunoglobulin A: 428 mg/dL — ABNORMAL HIGH (ref 47–310)

## 2022-02-01 LAB — ANTI-SMOOTH MUSCLE ANTIBODY, IGG: Actin (Smooth Muscle) Antibody (IGG): <20 U (ref <20)

## 2022-02-01 LAB — ALPHA-1-ANTITRYPSIN: A-1 Antitrypsin, Ser: 112 mg/dL (ref 83–199)

## 2022-02-09 ENCOUNTER — Ambulatory Visit
Admission: RE | Admit: 2022-02-09 | Discharge: 2022-02-09 | Disposition: A | Discharge status: Home / Self Care | Payer: Medicare Other | Source: Ambulatory Visit | Attending: Obstetrics and Gynecology | Admitting: Obstetrics and Gynecology

## 2022-02-09 DIAGNOSIS — Z9189 Other specified personal risk factors, not elsewhere classified: Secondary | ICD-10-CM

## 2022-02-09 MED ORDER — GADOPICLENOL 0.5 MMOL/ML IV SOLN
10.0000 mL | Freq: Once | INTRAVENOUS | Status: AC | PRN
  Administered 2022-02-09: 10 mL via INTRAVENOUS

## 2022-02-10 ENCOUNTER — Other Ambulatory Visit: Payer: Self-pay

## 2022-02-10 ENCOUNTER — Telehealth: Payer: Self-pay | Admitting: Nurse Practitioner

## 2022-02-10 DIAGNOSIS — R7989 Other specified abnormal findings of blood chemistry: Secondary | ICD-10-CM

## 2022-02-10 NOTE — Telephone Encounter (Signed)
Patient is returning your call regarding Labs results.Please advise

## 2022-02-10 NOTE — Telephone Encounter (Signed)
Spoke with pt. See 9/20 lab result notes.

## 2022-02-15 ENCOUNTER — Other Ambulatory Visit: Payer: Self-pay

## 2022-02-15 DIAGNOSIS — K838 Other specified diseases of biliary tract: Secondary | ICD-10-CM

## 2022-02-16 ENCOUNTER — Other Ambulatory Visit: Payer: Self-pay | Admitting: Internal Medicine

## 2022-02-16 DIAGNOSIS — J309 Allergic rhinitis, unspecified: Secondary | ICD-10-CM

## 2022-03-01 ENCOUNTER — Telehealth: Payer: Self-pay

## 2022-03-01 NOTE — Telephone Encounter (Signed)
Call to pt to offer next PREP class starting on 03/08/22 Pt sts now is not a good time. Picked up puppy and hurt back. MD appt in early NOV.  Offered to call her in the 2nd NOV class to see if she would like to participate then.  Pt agreeable

## 2022-03-17 ENCOUNTER — Ambulatory Visit (HOSPITAL_COMMUNITY)
Admission: RE | Admit: 2022-03-17 | Discharge: 2022-03-17 | Disposition: A | Discharge status: Home / Self Care | Payer: Medicare Other | Source: Ambulatory Visit | Attending: Nurse Practitioner | Admitting: Nurse Practitioner

## 2022-03-17 ENCOUNTER — Other Ambulatory Visit (HOSPITAL_COMMUNITY): Payer: Self-pay | Admitting: Nurse Practitioner

## 2022-03-17 DIAGNOSIS — K838 Other specified diseases of biliary tract: Secondary | ICD-10-CM

## 2022-03-17 MED ORDER — GADOBUTROL 1 MMOL/ML IV SOLN
10.0000 mL | Freq: Once | INTRAVENOUS | Status: AC | PRN
  Administered 2022-03-17: 10 mL via INTRAVENOUS

## 2022-03-22 ENCOUNTER — Other Ambulatory Visit: Payer: Self-pay

## 2022-03-22 DIAGNOSIS — R7989 Other specified abnormal findings of blood chemistry: Secondary | ICD-10-CM

## 2022-03-30 ENCOUNTER — Telehealth: Payer: Self-pay

## 2022-03-30 NOTE — Telephone Encounter (Signed)
-----   Message from Willia Craze, NP sent at 03/26/2022  3:59 PM EST ----- Grace [name],  It should be fine for her to take the hair and nail supplement.  I cannot advise on the mushroom supplement as I am not familiar with it.  Please let her know that the more vitamins/supplements that we add in the higher the likelihood of causing liver test abnormalities which can make it difficult for [place] to sort through things.  Thanks

## 2022-03-30 NOTE — Telephone Encounter (Signed)
Gave pt Paula's message. Pt verbalized understanding and had no other concerns at end of call.

## 2022-03-30 NOTE — Progress Notes (Signed)
[facility] PREP Evaluation  Patient Details  Name: Grace [name] MRN: [id number] Date of Birth: April 04, 1961 Age: 61 y.o. PCP: [name], MD  Vitals:   03/30/22 1146  BP: 112/78  Pulse: 85  SpO2: 96%  Weight: 226 lb (102.5 kg)     [facility] Eval - 03/30/22 1100       [facility] "PREP" Location   [facility] "PREP" Location [facility]      Referral    Referring Provider Sanders    Reason for referral Inactivity;Obesitity/Overweight    Program Start Date 04/06/22   T/TH 230p-345p     Measurement   Waist Circumference 51.5 inches    Hip Circumference 48 inches    Body fat 47.3 percent      Information for Trainer   Goals Lose inches off waist, gain energy, build muscle    Current Exercise none    Orthopedic Concerns Back-buldging discs, hx of bil THR    Pertinent Medical History MS, arthritis, transverse myelitis    Current Barriers Md appts in Dec    Restrictions/Precautions --   no falls   Medications that affect exercise Medication causing dizziness/drowsiness      Timed Up and Go (TUGS)   Timed Up and Go Low risk <9 seconds      Mobility and Daily Activities   I find it easy to walk up or down two or more flights of stairs. 1    I have no trouble taking out the trash. 1    I do housework such as vacuuming and dusting on my own without difficulty. 1    I can easily lift a gallon of milk (8lbs). 4    I can easily walk a mile. 1    I have no trouble reaching into high cupboards or reaching down to pick up something from the floor. 2    I do not have trouble doing out-door work such as Armed forces logistics/support/administrative officer, raking leaves, or gardening. 1      Mobility and Daily Activities   I feel younger than my age. 1    I feel independent. 2    I feel energetic. 1    I live an active life.  1    I feel strong. 1    I feel healthy. 1    I feel active as other people my age. 3      How fit and strong are you.   Fit and Strong Total Score 21            Past Medical History:   Diagnosis Date   Allergy    Anxiety    Avascular necrosis (HCC)    Back pain    Cancer (HCC)    Chest pain    Chewing difficulty    Chronic fatigue syndrome    Colon polyp    pre-cancerous   Complication of anesthesia    patient states she requires more anesthesia   Depression    Disorder of soft tissue    Diverticulosis    Dizziness    Edema, lower extremity    Fatty liver    Fibromyalgia    GERD (gastroesophageal reflux disease)    H/O breast biopsy    pre- cancerous cells   Hand fracture, left    9/87   Heart palpitations    Heart valve disease    History of Holter monitoring    Hyperlipidemia    Insomnia    Joint pain  Lactose intolerance    Lhermitte's syndrome    Migraines    Multiple food allergies    Multiple sclerosis (HCC)    dx in 11/1996   Multiple sclerosis (HCC)    Neuromuscular disorder (HCC)    Neuropathy    Numbness and tingling in hands    Obesity    Optic neuritis    OSA (obstructive sleep apnea)    no longer have OSA since stopped taking a MS medication   Osteoarthritis    Otitis media    Palpitations    Prediabetes    Raynaud's phenomenon    Shortness of breath    Sleep apnea    Due to medication that she no longer takes.   Swallowing difficulty    Vaginitis and vulvovaginitis    Vitamin B12 deficiency    Vitamin D deficiency    Past Surgical History:  Procedure Laterality Date   ABDOMINAL HYSTERECTOMY Bilateral 02/12/2016   Procedure: HYSTERECTOMY ABDOMINAL WITH BILATERAL SALPINGO OOPHERECTOMY;  Surgeon: Dian Queen, MD;  Location: [facility];  Service: Gynecology;  Laterality: Bilateral;   BREAST EXCISIONAL BIOPSY     core/left breast   CATARACT EXTRACTION, BILATERAL  06/2019   CHOLECYSTECTOMY  1983   COLONOSCOPY     1997/2001   DILATION AND CURETTAGE OF UTERUS     ENDOMETRIAL ABLATION  2012   LAPAROSCOPIC ABDOMINAL EXPLORATION     ovaries and intestines bond together   LEFT HEART CATHETERIZATION WITH CORONARY ANGIOGRAM  N/A 06/21/2011   Procedure: LEFT HEART CATHETERIZATION WITH CORONARY ANGIOGRAM;  Surgeon: Leonie Man, MD;  Location[facility] CATH LAB;  Service: Cardiovascular;  Laterality: N/A;   LUMBAR PUNCTURE     11/01/1996   SALPINGOOPHORECTOMY Bilateral 02/12/2016   Procedure: BILATERAL SALPINGO OOPHORECTOMY;  Surgeon: Dian Queen, MD;  Location: [facility];  Service: Gynecology;  Laterality: Bilateral;   TOTAL HIP ARTHROPLASTY     left hip  6/02  /right hip12/04   Social History   Tobacco Use  Smoking Status Former   Packs/day: 0.25   Years: 8.00   Total pack years: 2.00   Types: Cigarettes   Quit date: 07/01/1995   Years since quitting: 26.7  Smokeless Tobacco Never    Barnett Hatter 03/30/2022, 11:51 AM

## 2022-03-31 ENCOUNTER — Telehealth: Payer: Self-pay | Admitting: Neurology

## 2022-03-31 ENCOUNTER — Other Ambulatory Visit: Payer: Self-pay | Admitting: Internal Medicine

## 2022-03-31 NOTE — Telephone Encounter (Signed)
She saw Dr. Hassell Done at [facility] neuro-ophthalmology.  She was found on OCT to have epiretinal membrane in the right eye.  This is not related to her MS and she was being referred to a retina specialist.  She also has modest nuclear sclerotic cataract.

## 2022-04-06 ENCOUNTER — Other Ambulatory Visit: Payer: Self-pay | Admitting: Internal Medicine

## 2022-04-19 NOTE — Progress Notes (Signed)
[facility] PREP Weekly Session  Patient Details  Name: Grace [name] MRN: [id number] Date of Birth: February 04, 1961 Age: 61 y.o. PCP: [name], MD  Vitals:   04/13/22 1430  Weight: 228 lb 12.8 oz (103.8 kg)     [facility] Weekly seesion - 04/19/22 0900       [facility] "PREP" Location   [facility] "PREP" Location [facility]      Weekly Session   Topic Discussed Importance of resistance training;Other ways to be active    Minutes exercised this week 375 minutes    Classes attended to date [place] 04/19/2022, 9:34 AM

## 2022-04-20 NOTE — Progress Notes (Signed)
[facility] PREP Weekly Session  Patient Details  Name: Grace [name] MRN: [id number] Date of Birth: 22-Dec-1960 Age: 61 y.o. PCP: [name], MD  Vitals:   04/20/22 1430  Weight: 228 lb (103.4 kg)     [facility] Weekly seesion - 04/20/22 1600       [facility] "PREP" Location   [facility] "PREP" [facility]      Weekly Session   Topic Discussed Healthy eating tips    Minutes exercised this week 350 minutes    Classes attended to date [place] 04/20/2022, 4:25 PM

## 2022-04-21 ENCOUNTER — Other Ambulatory Visit: Payer: Self-pay | Admitting: Neurology

## 2022-04-22 MED ORDER — HYDROCODONE-ACETAMINOPHEN 5-325 MG PO TABS: 1.0000 | ORAL_TABLET | Freq: Three times a day (TID) | ORAL | 0 refills | Status: DC | PRN

## 2022-04-28 NOTE — Progress Notes (Signed)
[facility] PREP Weekly Session  Patient Details  Name: Grace [name] MRN: [id number] Date of Birth: 10-22-1960 Age: 61 y.o. PCP: [name], MD  Vitals:   04/27/22 1430  Weight: 228 lb 3.2 oz (103.5 kg)     [facility] Weekly seesion - 04/28/22 1100       [facility] "PREP" Location   [facility] "PREP" [facility]      Weekly Session   Topic Discussed Health habits   sugar demo   Minutes exercised this week 600 minutes    Classes attended to date [place] 04/28/2022, 11:40 AM

## 2022-04-29 ENCOUNTER — Encounter: Payer: Self-pay | Admitting: Internal Medicine

## 2022-04-29 ENCOUNTER — Ambulatory Visit (INDEPENDENT_AMBULATORY_CARE_PROVIDER_SITE_OTHER): Payer: Medicare Other | Admitting: Internal Medicine

## 2022-04-29 VITALS — BP 124/82 | HR 72 | Temp 97.9°F | Ht 66.0 in | Wt 226.0 lb

## 2022-04-29 DIAGNOSIS — R7303 Prediabetes: Secondary | ICD-10-CM

## 2022-04-29 DIAGNOSIS — E782 Mixed hyperlipidemia: Secondary | ICD-10-CM | POA: Diagnosis not present

## 2022-04-29 DIAGNOSIS — Z2821 Immunization not carried out because of patient refusal: Secondary | ICD-10-CM

## 2022-04-29 DIAGNOSIS — E559 Vitamin D deficiency, unspecified: Secondary | ICD-10-CM | POA: Diagnosis not present

## 2022-04-29 DIAGNOSIS — Z6836 Body mass index (BMI) 36.0-36.9, adult: Secondary | ICD-10-CM

## 2022-04-29 DIAGNOSIS — F3342 Major depressive disorder, recurrent, in full remission: Secondary | ICD-10-CM

## 2022-04-29 DIAGNOSIS — Z0000 Encounter for general adult medical examination without abnormal findings: Secondary | ICD-10-CM | POA: Diagnosis not present

## 2022-04-29 NOTE — Progress Notes (Signed)
[name],acting as a Education administrator for [name], MD.,have documented all relevant documentation on the behalf of [name], MD,as directed by  [name], MD while in the presence of [name], MD.   Subjective:     Patient ID: Grace [name] , female    DOB: 1960/11/17 , 61 y.o.   MRN: [id number]   Chief Complaint  Patient presents with   Annual Exam    HPI  She is here today for a full physical exam. She is followed by Dr. Hazle Coca for her GYN care. She reports compliance with meds. She denies having any blurred vision, cp, and DOE. She is also followed by Neurology for MS.      Past Medical History:  Diagnosis Date   Allergy    Anxiety    Avascular necrosis (HCC)    Back pain    Cancer (HCC)    Chest pain    Chewing difficulty    Chronic fatigue syndrome    Colon polyp    pre-cancerous   Complication of anesthesia    patient states she requires more anesthesia   Depression    Disorder of soft tissue    Diverticulosis    Dizziness    Edema, lower extremity    Fatty liver    Fibromyalgia    GERD (gastroesophageal reflux disease)    H/O breast biopsy    pre- cancerous cells   Hand fracture, left    9/87   Heart palpitations    Heart valve disease    History of Holter monitoring    Hyperlipidemia    Insomnia    Joint pain    Lactose intolerance    Lhermitte's syndrome    Migraines    Multiple food allergies    Multiple sclerosis (HCC)    dx in 11/1996   Multiple sclerosis (HCC)    Neuromuscular disorder (HCC)    Neuropathy    Numbness and tingling in hands    Obesity    Optic neuritis    OSA (obstructive sleep apnea)    no longer have OSA since stopped taking a MS medication   Osteoarthritis    Otitis media    Palpitations    Prediabetes    Raynaud's phenomenon    Shortness of breath    Sleep apnea    Due to medication that she no longer takes.   Swallowing difficulty    Vaginitis and vulvovaginitis    Vitamin B12  deficiency    Vitamin D deficiency      Family History  Problem Relation Age of Onset   Pancreatic cancer Mother    Stroke Mother    Hypertension Mother    Hyperlipidemia Mother    Heart disease Mother    Cancer Mother    Depression Mother    Anxiety disorder Mother    Alcoholism Mother    Testicular cancer Brother    Colon cancer Maternal Uncle    Pancreatic cancer Paternal Uncle    Prostate cancer Maternal Grandfather    Colon polyps Father    Diabetes Father    Hypertension Father    Hyperlipidemia Father    Kidney disease Father    Sleep apnea Father    Obesity Father      Current Outpatient Medications:    ADVANCED EVENING PRIMROSE OIL PO, Take 1 capsule by mouth daily. Total 1300 mg, Disp: , Rfl:    Ascorbic Acid (VITAMIN C) 1000 MG tablet,  Take 1,000 mg by mouth daily., Disp: , Rfl:    aspirin EC 81 MG tablet, Take 81 mg by mouth daily., Disp: , Rfl:    AZO-CRANBERRY PO, Take 1 capsule by mouth daily at 12 noon., Disp: , Rfl:    baclofen (LIORESAL) 10 MG tablet, TAKE 2 TABLETS BY MOUTH 3 TIMES DAILY., Disp: 540 tablet, Rfl: 3   BLACK ELDERBERRY PO, Take 300 mg by mouth in the morning and at bedtime., Disp: , Rfl:    Cholecalciferol (VITAMIN D) 50 MCG (2000 UT) CAPS, Take 2,000 Units by mouth daily. , Disp: , Rfl:    CINNAMON PO, Take 1,200 mg by mouth daily. , Disp: , Rfl:    diphenhydrAMINE (BENADRYL) 25 MG tablet, Take 25 mg by mouth 3 (three) times daily. Takes with Hydrocodone, Disp: , Rfl:    doxepin (SINEQUAN) 10 MG capsule, TAKE 1-2 CAPSULES (10-20 MG TOTAL) BY MOUTH AT BEDTIME., Disp: 180 capsule, Rfl: 1   famotidine (PEPCID) 20 MG tablet, TAKE 1 TABLET BY MOUTH TWICE A DAY, Disp: 180 tablet, Rfl: 1   fexofenadine (ALLEGRA) 180 MG tablet, Take 1 tablet (180 mg total) by mouth daily., Disp: 90 tablet, Rfl: 2   Flaxseed, Linseed, (FLAX SEED OIL) 1000 MG CAPS, Take 1,200 mg by mouth daily. , Disp: , Rfl:    gabapentin (NEURONTIN) 300 MG capsule, Take 1 capsule  (300 mg total) by mouth 3 (three) times daily., Disp: 270 capsule, Rfl: 1   HYDROcodone-acetaminophen (NORCO/VICODIN) 5-325 MG tablet, Take 1 tablet by mouth 3 (three) times daily as needed for moderate pain., Disp: 270 tablet, Rfl: 0   ipratropium (ATROVENT) 0.06 % nasal spray, Use 2 sprays each nostril bid prn drainage, Disp: 30 mL, Rfl: 5   L-Lysine 1000 MG TABS, Take by mouth. As needed, Disp: , Rfl:    Lactobacillus (PROBIOTIC ACIDOPHILUS PO), Take by mouth daily. Digestive Advantage BC   bacillus coagulans, Disp: , Rfl:    levocetirizine (XYZAL) 5 MG tablet, TAKE 1 TABLET BY MOUTH EVERY DAY IN THE EVENING, Disp: 90 tablet, Rfl: 2   LORazepam (ATIVAN) 1 MG tablet, Take 1 tablet (1 mg total) by mouth at bedtime., Disp: 30 tablet, Rfl: 5   Lutein-Bilberry (BILBERRY PLUS LUTEIN PO), Take by mouth daily., Disp: , Rfl:    Melatonin 10 MG TABS, Take 1 tablet by mouth at bedtime. , Disp: , Rfl:    methylphenidate (CONCERTA) 27 MG PO CR tablet, Take 1 tablet (27 mg total) by mouth every morning., Disp: 90 tablet, Rfl: 0   Moringa 500 MG CAPS, Take 2 capsules by mouth daily., Disp: , Rfl:    Multiple Vitamins-Minerals (HAIR/SKIN/NAILS/BIOTIN) TABS, Take by mouth daily. , Disp: , Rfl:    Nutritional Supplements (NUTRITIONAL SUPPLEMENT PO), Take 2.1 g by mouth in the morning and at bedtime. LION'S MANE, Disp: , Rfl:    nystatin-triamcinolone ointment (MYCOLOG), Apply 1 application topically 2 (two) times daily., Disp: 30 g, Rfl: 2   ondansetron (ZOFRAN-ODT) 8 MG disintegrating tablet, Take 1 tablet (8 mg total) by mouth every 8 (eight) hours as needed for nausea or vomiting., Disp: 20 tablet, Rfl: 0   rizatriptan (MAXALT-MLT) 10 MG disintegrating tablet, Take 1 tablet (10 mg total) by mouth as needed for migraine. May repeat in 2 hours if needed.  Max of 2 tablets in 24 hours, and 10 tablets per month, Disp: 10 tablet, Rfl: 11   tiZANidine (ZANAFLEX) 2 MG tablet, TAKE UP TO 4 TABLETS BY MOUTH EVERY  DAY,  Disp: 360 tablet, Rfl: 1   Vitamin D, Ergocalciferol, (DRISDOL) 1.25 MG (50000 UNIT) CAPS capsule, ONE CAPSULE TWICE WEEKLY ON TUESDAYS/FRIDAYS, Disp: 24 capsule, Rfl: 2   vitamin E 1000 UNIT capsule, vitamin E 670 mg (1,000 unit) capsule   1 capsule every day by oral route., Disp: , Rfl:    zolpidem (AMBIEN) 10 MG tablet, Take 10 mg by mouth at bedtime as needed. For sleep, Disp: , Rfl:    Allergies  Allergen Reactions   Diclofenac Anaphylaxis   Lamictal [Lamotrigine] Shortness Of Breath, Itching and Swelling    Tongue swells   Mobic [Meloxicam] Anaphylaxis   Ultram [Tramadol Hcl] Anaphylaxis   Nefazodone Itching and Swelling    Other reaction(s): Arthralgia (Joint Pain); Throat swelling, mouth sores   Betaseron [Interferon Beta-1b] Other (See Comments)    Caused avascular necrosis   Codeine Itching   Copaxone [Glatiramer Acetate] Other (See Comments)    Altered mental status: mental changes, anxiety, motion sickness   Gabapentin Itching    Itching if she takes more than 357m   Latex     Latex band-aids cause redness and tears your skin   Nitrofurantoin     Causing itching all over, tongue swelling, chest heaviness, swollen lips, cough, rapid HR, swollen eyelids, red eyes/gums, lips red/white per pt.   Oxcarbazepine Itching and Other (See Comments)    Dizziness, throat hurts   Teriflunomide Diarrhea    Hair loss, joint pains, elevated liver enzymes abugio (joint pains and liver issues)   Trazodone And Nefazodone     Mouth sores, tongue swelling   Tape Rash      The patient states she uses post menopausal status for birth control. Last LMP was Patient's last menstrual period was 04/09/2013.. Negative for Dysmenorrhea. Negative for: breast discharge, breast lump(s), breast pain and breast self exam. Associated symptoms include abnormal vaginal bleeding. Pertinent negatives include abnormal bleeding (hematology), anxiety, decreased libido, depression, difficulty falling sleep,  dyspareunia, history of infertility, nocturia, sexual dysfunction, sleep disturbances, urinary incontinence, urinary urgency, vaginal discharge and vaginal itching. Diet regular.The patient states her exercise level has increased recently.   . The patient's tobacco use is:  Social History   Tobacco Use  Smoking Status Former   Packs/day: 0.25   Years: 8.00   Total pack years: 2.00   Types: Cigarettes   Quit date: 07/01/1995   Years since quitting: 26.8  Smokeless Tobacco Never  . She has been exposed to passive smoke. The patient's alcohol use is:  Social History   Substance and Sexual Activity  Alcohol Use No   Alcohol/week: 0.0 standard drinks of alcohol   Review of Systems  Constitutional: Negative.   HENT: Negative.    Eyes: Negative.   Respiratory: Negative.    Cardiovascular: Negative.   Gastrointestinal: Negative.   Endocrine: Negative.   Genitourinary: Negative.   Musculoskeletal: Negative.   Skin: Negative.   Allergic/Immunologic: Negative.   Neurological: Negative.   Hematological: Negative.   Psychiatric/Behavioral: Negative.       Today's Vitals   04/29/22 1456  BP: 124/82  Pulse: 72  Temp: 97.9 F (36.6 C)  Weight: 226 lb (102.5 kg)  Height: _0  (1.676 m)   Body mass index is 36.48 kg/m.  Wt Readings from Last 3 Encounters:  05/04/22 228 lb (103.4 kg)  04/29/22 226 lb (102.5 kg)  04/27/22 228 lb 3.2 oz (103.5 kg)    Objective:  Physical Exam Vitals and nursing note reviewed.  Constitutional:  Appearance: Normal appearance. She is obese.  HENT:     Head: Normocephalic and atraumatic.     Right Ear: Tympanic membrane, ear canal and external ear normal.     Left Ear: Tympanic membrane, ear canal and external ear normal.     Nose:     Comments: Masked     Mouth/Throat:     Comments: Masked  Eyes:     Extraocular Movements: Extraocular movements intact.     Conjunctiva/sclera: Conjunctivae normal.     Pupils: Pupils are equal, round,  and reactive to light.  Cardiovascular:     Rate and Rhythm: Normal rate and regular rhythm.     Pulses: Normal pulses.     Heart sounds: Normal heart sounds.  Pulmonary:     Effort: Pulmonary effort is normal.     Breath sounds: Normal breath sounds.  Chest:  Breasts:    Tanner Score is 5.     Right: Normal.     Left: Normal.  Abdominal:     General: Abdomen is flat. Bowel sounds are normal.     Palpations: Abdomen is soft.  Genitourinary:    Comments: deferred Musculoskeletal:        General: Normal range of motion.     Cervical back: Normal range of motion and neck supple.  Skin:    General: Skin is warm and dry.  Neurological:     General: No focal deficit present.     Mental Status: She is alert and oriented to person, place, and time.  Psychiatric:        Mood and Affect: Mood normal.        Behavior: Behavior normal.         Assessment And Plan:     1. Encounter for general adult medical examination w/o abnormal findings Comments: A full exam was performed. Importance of monthly self breast exams was discussed with the patient. PATIENT IS ADVISED TO GET 30-45 MINUTES REGULAR EXERCISE NO LESS THAN FOUR TO FIVE DAYS PER WEEK - BOTH WEIGHTBEARING EXERCISES AND AEROBIC ARE RECOMMENDED.  PATIENT IS ADVISED TO FOLLOW A HEALTHY DIET WITH AT LEAST SIX FRUITS/VEGGIES PER DAY, DECREASE INTAKE OF RED MEAT, AND TO INCREASE FISH INTAKE TO TWO DAYS PER WEEK.  MEATS/FISH SHOULD NOT BE FRIED, BAKED OR BROILED IS PREFERABLE.  IT IS ALSO IMPORTANT TO CUT BACK ON YOUR SUGAR INTAKE. PLEASE AVOID ANYTHING WITH ADDED SUGAR, CORN SYRUP OR OTHER SWEETENERS. IF YOU MUST USE A SWEETENER, YOU CAN TRY STEVIA. IT IS ALSO IMPORTANT TO AVOID ARTIFICIALLY SWEETENERS AND DIET BEVERAGES. LASTLY, I SUGGEST WEARING SPF 50 SUNSCREEN ON EXPOSED PARTS AND ESPECIALLY WHEN IN THE DIRECT SUNLIGHT FOR AN EXTENDED PERIOD OF TIME.  PLEASE AVOID FAST FOOD RESTAURANTS AND INCREASE YOUR WATER INTAKE.  2.  Prediabetes Comments: Her a1c has been elevated in the past. I will recheck this today. She is encouraged to limit her intake of sugary beverages and to increase her daily activity.  She agrees to f/u in six months.  - CBC - CMP14+EGFR - Hemoglobin A1c  3. Mixed hyperlipidemia Comments: Chronic, no longer on statin therapy. I will recheck lipid panel today. She is encouraged to follow a heart healthy lifestyle.  4. Vitamin D deficiency disease Comments: I will check a vitamin D level and supplement as needed. - Vitamin D (25 hydroxy)  5. Class 2 severe obesity due to excess calories with serious comorbidity and body mass index (BMI) of 36.0 to 36.9 in adult [facility])  Comments: She is currently followed by [facility] and is participating in [place] program.  6. Herpes zoster vaccination declined  7. Influenza vaccination declined  8. COVID-19 vaccination declined   Patient was given opportunity to ask questions. Patient verbalized understanding of the plan and was able to repeat key elements of the plan. All questions were answered to their satisfaction.   I, [name], MD, have reviewed all documentation for this visit. The documentation on 04/29/22 for the exam, diagnosis, procedures, and orders are all accurate and complete.   THE PATIENT IS ENCOURAGED TO PRACTICE SOCIAL DISTANCING DUE TO THE COVID-19 PANDEMIC.

## 2022-04-29 NOTE — Patient Instructions (Signed)

## 2022-04-30 LAB — CMP14+EGFR
ALT: 87 IU/L — ABNORMAL HIGH (ref 0–32)
AST: 131 IU/L — ABNORMAL HIGH (ref 0–40)
Albumin/Globulin Ratio: 1.8 (ref 1.2–2.2)
Albumin: 4.5 g/dL (ref 3.8–4.9)
Alkaline Phosphatase: 118 IU/L (ref 44–121)
BUN/Creatinine Ratio: 14 (ref 9–23)
BUN: 11 mg/dL (ref 6–24)
Bilirubin Total: 0.8 mg/dL (ref 0.0–1.2)
CO2: 24 mmol/L (ref 20–29)
Calcium: 9.5 mg/dL (ref 8.7–10.2)
Chloride: 105 mmol/L (ref 96–106)
Creatinine, Ser: 0.78 mg/dL (ref 0.57–1.00)
Globulin, Total: 2.5 g/dL (ref 1.5–4.5)
Glucose: 98 mg/dL (ref 70–99)
Potassium: 4.6 mmol/L (ref 3.5–5.2)
Sodium: 143 mmol/L (ref 134–144)
Total Protein: 7 g/dL (ref 6.0–8.5)
eGFR: 87 mL/min/{1.73_m2} (ref >59)

## 2022-04-30 LAB — CBC
Hematocrit: 40.8 % (ref 34.0–46.6)
Hemoglobin: 14.3 g/dL (ref 11.1–15.9)
MCH: 32.4 pg (ref 26.6–33.0)
MCHC: 35 g/dL (ref 31.5–35.7)
MCV: 93 fL (ref 79–97)
Platelets: 276 10*3/uL (ref 150–450)
RBC: 4.41 x10E6/uL (ref 3.77–5.28)
RDW: 11.6 % — ABNORMAL LOW (ref 11.7–15.4)
WBC: 5.2 10*3/uL (ref 3.4–10.8)

## 2022-04-30 LAB — HEMOGLOBIN A1C
Est. average glucose Bld gHb Est-mCnc: 128 mg/dL
Hgb A1c MFr Bld: 6.1 % — ABNORMAL HIGH (ref 4.8–5.6)

## 2022-04-30 LAB — VITAMIN D 25 HYDROXY (VIT D DEFICIENCY, FRACTURES): Vit D, 25-Hydroxy: 47.9 ng/mL (ref 30.0–100.0)

## 2022-05-04 NOTE — Progress Notes (Signed)
[facility] PRE[facility] Weekly Session  Patient Details  Name: Grace [name] MRN: [id number] Date of Birth: Feb 18, 1961 Age: 62 y.o. PCP: [name], MD  Vitals:   05/04/22 1602  Weight: 228 lb (103.4 kg)     [facility] Weekly seesion - 05/04/22 1600       [facility]" Location   [facility]      Weekly Session   Topic Discussed Restaurant Eating   salt demo   Minutes exercised this week 630 minutes    Classes attended to date [place] 05/04/2022, 4:03 PM

## 2022-05-20 ENCOUNTER — Emergency Department (HOSPITAL_BASED_OUTPATIENT_CLINIC_OR_DEPARTMENT_OTHER)
Admission: EM | Admit: 2022-05-20 | Discharge: 2022-05-20 | Disposition: A | Discharge status: Home / Self Care | Payer: Medicare Other | Attending: Emergency Medicine | Admitting: Emergency Medicine

## 2022-05-20 ENCOUNTER — Ambulatory Visit
Admission: RE | Admit: 2022-05-20 | Discharge: 2022-05-20 | Disposition: A | Discharge status: Other Acute Inpatient Hospital | Payer: Medicare Other | Source: Ambulatory Visit | Attending: Internal Medicine | Admitting: Internal Medicine

## 2022-05-20 ENCOUNTER — Emergency Department (HOSPITAL_BASED_OUTPATIENT_CLINIC_OR_DEPARTMENT_OTHER): Payer: Medicare Other

## 2022-05-20 ENCOUNTER — Encounter (HOSPITAL_BASED_OUTPATIENT_CLINIC_OR_DEPARTMENT_OTHER): Payer: Self-pay

## 2022-05-20 ENCOUNTER — Other Ambulatory Visit: Payer: Self-pay

## 2022-05-20 VITALS — BP 153/83 | HR 91 | Temp 98.2°F | Resp 19

## 2022-05-20 DIAGNOSIS — N201 Calculus of ureter: Secondary | ICD-10-CM | POA: Insufficient documentation

## 2022-05-20 DIAGNOSIS — Z7982 Long term (current) use of aspirin: Secondary | ICD-10-CM | POA: Diagnosis not present

## 2022-05-20 DIAGNOSIS — R944 Abnormal results of kidney function studies: Secondary | ICD-10-CM | POA: Insufficient documentation

## 2022-05-20 DIAGNOSIS — D72829 Elevated white blood cell count, unspecified: Secondary | ICD-10-CM | POA: Diagnosis not present

## 2022-05-20 DIAGNOSIS — R1032 Left lower quadrant pain: Secondary | ICD-10-CM | POA: Diagnosis present

## 2022-05-20 DIAGNOSIS — Z91040 Latex allergy status: Secondary | ICD-10-CM | POA: Diagnosis not present

## 2022-05-20 LAB — CBC WITH DIFFERENTIAL/PLATELET
Abs Immature Granulocytes: 0.03 10*3/uL (ref 0.00–0.07)
Basophils Absolute: 0 10*3/uL (ref 0.0–0.1)
Basophils Relative: 0 %
Eosinophils Absolute: 0 10*3/uL (ref 0.0–0.5)
Eosinophils Relative: 0 %
HCT: 41.7 % (ref 36.0–46.0)
Hemoglobin: 14 g/dL (ref 12.0–15.0)
Immature Granulocytes: 0 %
Lymphocytes Relative: 16 %
Lymphs Abs: 1.8 10*3/uL (ref 0.7–4.0)
MCH: 32.2 pg (ref 26.0–34.0)
MCHC: 33.6 g/dL (ref 30.0–36.0)
MCV: 95.9 fL (ref 80.0–100.0)
Monocytes Absolute: 1.1 10*3/uL — ABNORMAL HIGH (ref 0.1–1.0)
Monocytes Relative: 10 %
Neutro Abs: 8.3 10*3/uL — ABNORMAL HIGH (ref 1.7–7.7)
Neutrophils Relative %: 74 %
Platelets: 271 10*3/uL (ref 150–400)
RBC: 4.35 MIL/uL (ref 3.87–5.11)
RDW: 13.1 % (ref 11.5–15.5)
WBC: 11.3 10*3/uL — ABNORMAL HIGH (ref 4.0–10.5)
nRBC: 0 % (ref 0.0–0.2)

## 2022-05-20 LAB — URINALYSIS, ROUTINE W REFLEX MICROSCOPIC
Bacteria, UA: NONE SEEN
Bilirubin Urine: NEGATIVE
Glucose, UA: NEGATIVE mg/dL
Ketones, ur: 40 mg/dL — AB
Nitrite: NEGATIVE
Protein, ur: NEGATIVE mg/dL
Specific Gravity, Urine: 1.018 (ref 1.005–1.030)
pH: 5 (ref 5.0–8.0)

## 2022-05-20 LAB — COMPREHENSIVE METABOLIC PANEL
ALT: 27 U/L (ref 0–44)
AST: 22 U/L (ref 15–41)
Albumin: 4.4 g/dL (ref 3.5–5.0)
Alkaline Phosphatase: 82 U/L (ref 38–126)
Anion gap: 15 (ref 5–15)
BUN: 17 mg/dL (ref 6–20)
CO2: 21 mmol/L — ABNORMAL LOW (ref 22–32)
Calcium: 9.8 mg/dL (ref 8.9–10.3)
Chloride: 100 mmol/L (ref 98–111)
Creatinine, Ser: 1.11 mg/dL — ABNORMAL HIGH (ref 0.44–1.00)
GFR, Estimated: 57 mL/min — ABNORMAL LOW (ref >60)
Glucose, Bld: 103 mg/dL — ABNORMAL HIGH (ref 70–99)
Potassium: 3.9 mmol/L (ref 3.5–5.1)
Sodium: 136 mmol/L (ref 135–145)
Total Bilirubin: 0.8 mg/dL (ref 0.3–1.2)
Total Protein: 8.2 g/dL — ABNORMAL HIGH (ref 6.5–8.1)

## 2022-05-20 LAB — PREGNANCY, URINE: Preg Test, Ur: NEGATIVE

## 2022-05-20 MED ORDER — SODIUM CHLORIDE 0.9 % IV BOLUS
500.0000 mL | Freq: Once | INTRAVENOUS | Status: AC
  Administered 2022-05-20: 500 mL via INTRAVENOUS

## 2022-05-20 MED ORDER — TAMSULOSIN HCL 0.4 MG PO CAPS: 0.4000 mg | ORAL_CAPSULE | Freq: Every day | ORAL | 0 refills | Status: DC

## 2022-05-20 MED ORDER — ONDANSETRON HCL 4 MG/2ML IJ SOLN
4.0000 mg | Freq: Once | INTRAMUSCULAR | Status: AC
  Administered 2022-05-20: 4 mg via INTRAVENOUS
  Filled 2022-05-20: qty 2

## 2022-05-20 MED ORDER — FENTANYL CITRATE PF 50 MCG/ML IJ SOSY
50.0000 ug | PREFILLED_SYRINGE | Freq: Once | INTRAMUSCULAR | Status: AC
  Administered 2022-05-20: 50 ug via INTRAVENOUS
  Filled 2022-05-20: qty 1

## 2022-05-20 MED ORDER — ONDANSETRON 4 MG PO TBDP: 4.0000 mg | ORAL_TABLET | Freq: Three times a day (TID) | ORAL | 0 refills | Status: AC | PRN

## 2022-05-20 NOTE — ED Notes (Signed)
Patient is being discharged from the Urgent Care and sent to the Emergency Department via pov . Per myers, pa, patient is in need of higher level of care due to 10/10 abd pain with hx of diverticulitis and abnormal liver enzymes. Patient is aware and verbalizes understanding of plan of care.  Vitals:   05/20/22 1438 05/20/22 1439  BP:  (!) 153/83  Pulse: 91   Resp: 19   Temp: 98.2 F (36.8 C)   SpO2: 95%

## 2022-05-20 NOTE — Discharge Instructions (Signed)
  Please report to  [facility]  [address] [place], [place] [address]

## 2022-05-20 NOTE — ED Notes (Signed)
Reviewed AVS/discharge instruction with patient. Time allotted for and all questions answered. Patient is agreeable for d/c and escorted to ed exit by staff.  

## 2022-05-20 NOTE — ED Provider Notes (Signed)
[facility] EMERGENCY DEPT Provider Note   CSN: [id number] Arrival date & time: 05/20/22  1516     History  Chief Complaint  Patient presents with   Flank Pain    KENDRICK REMIGIO is a 62 y.o. female.  62 year old female with history of diverticular disease without history of diverticulitis, MS, AVN, hyperlipidemia, OSA, presents with complaint of left side abdominal pain onset Tuesday last week, resolved and returned 2 days ago.  Described as a knife stabbing her in her side.  Tried milk of magnesia, had bowel movements x 3 days without improvement in her pain.       Home Medications Prior to Admission medications   Medication Sig Start Date End Date Taking? Authorizing Provider  ondansetron (ZOFRAN-ODT) 4 MG disintegrating tablet Take 1 tablet (4 mg total) by mouth every 8 (eight) hours as needed for nausea or vomiting. 05/20/22  Yes Tacy Learn, PA-C  tamsulosin (FLOMAX) 0.4 MG CAPS capsule Take 1 capsule (0.4 mg total) by mouth daily. 05/20/22  Yes Tacy Learn, PA-C  ADVANCED EVENING PRIMROSE OIL PO Take 1 capsule by mouth daily. Total 1300 mg    [provider]  Ascorbic Acid (VITAMIN C) 1000 MG tablet Take 1,000 mg by mouth daily.    [provider]  aspirin EC 81 MG tablet Take 81 mg by mouth daily.    [provider]  AZO-CRANBERRY PO Take 1 capsule by mouth daily at 12 noon.    [provider]  baclofen (LIORESAL) 10 MG tablet TAKE 2 TABLETS BY MOUTH 3 TIMES DAILY. 07/16/21   Sater, Nanine Means, MD  BLACK ELDERBERRY PO Take 300 mg by mouth in the morning and at bedtime.    [provider]  Cholecalciferol (VITAMIN D) 50 MCG (2000 UT) CAPS Take 2,000 Units by mouth daily.     [provider]  CINNAMON PO Take 1,200 mg by mouth daily.     [provider]  diphenhydrAMINE (BENADRYL) 25 MG tablet Take 25 mg by mouth 3 (three) times daily. Takes with Hydrocodone    [provider]  doxepin  (SINEQUAN) 10 MG capsule TAKE 1-2 CAPSULES (10-20 MG TOTAL) BY MOUTH AT BEDTIME. 01/11/22   Sater, Nanine Means, MD  famotidine (PEPCID) 20 MG tablet TAKE 1 TABLET BY MOUTH TWICE A DAY 04/07/22   [name], MD  fexofenadine (ALLEGRA) 180 MG tablet Take 1 tablet (180 mg total) by mouth daily. 10/21/21   [name], MD  Flaxseed, Linseed, (FLAX SEED OIL) 1000 MG CAPS Take 1,200 mg by mouth daily.     [provider]  gabapentin (NEURONTIN) 300 MG capsule Take 1 capsule (300 mg total) by mouth 3 (three) times daily. 01/13/22   Lomax, Amy, NP  HYDROcodone-acetaminophen (NORCO/VICODIN) 5-325 MG tablet Take 1 tablet by mouth 3 (three) times daily as needed for moderate pain. 04/22/22   Sater, Nanine Means, MD  ipratropium (ATROVENT) 0.06 % nasal spray Use 2 sprays each nostril bid prn drainage 10/16/20   [name], MD  L-Lysine 1000 MG TABS Take by mouth. As needed    [provider]  Lactobacillus (PROBIOTIC ACIDOPHILUS PO) Take by mouth daily. Digestive Advantage BC   bacillus coagulans    [provider]  levocetirizine (XYZAL) 5 MG tablet TAKE 1 TABLET BY MOUTH EVERY DAY IN THE EVENING 04/07/22   [name], MD  LORazepam (ATIVAN) 1 MG tablet Take 1 tablet (1 mg total) by mouth at bedtime. 03/06/19  Sater, Nanine Means, MD  Lutein-Bilberry (BILBERRY PLUS LUTEIN PO) Take by mouth daily.    [provider]  Melatonin 10 MG TABS Take 1 tablet by mouth at bedtime.     [provider]  methylphenidate (CONCERTA) 27 MG PO CR tablet Take 1 tablet (27 mg total) by mouth every morning. 11/12/21   Sater, Nanine Means, MD  Moringa 500 MG CAPS Take 2 capsules by mouth daily.    [provider]  Multiple Vitamins-Minerals (HAIR/SKIN/NAILS/BIOTIN) TABS Take by mouth daily.     [provider]  Nutritional Supplements (NUTRITIONAL SUPPLEMENT PO) Take 2.1 g by mouth in the morning and at bedtime. LION'S MANE    [provider]   nystatin-triamcinolone ointment (MYCOLOG) Apply 1 application topically 2 (two) times daily. 01/31/20   Minette Brine, FNP  rizatriptan (MAXALT-MLT) 10 MG disintegrating tablet Take 1 tablet (10 mg total) by mouth as needed for migraine. May repeat in 2 hours if needed.  Max of 2 tablets in 24 hours, and 10 tablets per month 12/10/21   Sater, Nanine Means, MD  tiZANidine (ZANAFLEX) 2 MG tablet TAKE UP TO 4 TABLETS BY MOUTH EVERY DAY 11/05/21   Sater, Nanine Means, MD  Vitamin D, Ergocalciferol, (DRISDOL) 1.25 MG (50000 UNIT) CAPS capsule ONE CAPSULE TWICE WEEKLY ON TUESDAYS/FRIDAYS 03/31/22   [name], MD  vitamin E 1000 UNIT capsule vitamin E 670 mg (1,000 unit) capsule   1 capsule every day by oral route.    [provider]  zolpidem (AMBIEN) 10 MG tablet Take 10 mg by mouth at bedtime as needed. For sleep    [provider]      Allergies    Diclofenac, Lamictal [lamotrigine], Mobic [meloxicam], Ultram [tramadol hcl], Nefazodone, Banana, Beef-derived products, Betaseron [interferon beta-1b], Codeine, Copaxone [glatiramer acetate], Gabapentin, Latex, Nitrofurantoin, Oxcarbazepine, Pork-derived products, Teriflunomide, Trazodone and nefazodone, and Tape    Review of Systems   Review of Systems Negative except as per HPI Physical Exam Updated Vital Signs BP (!) 123/91   Pulse 86   Temp 97.9 F (36.6 C) (Oral)   LMP 04/09/2013   SpO2 97%  Physical Exam Vitals and nursing note reviewed.  Constitutional:      General: She is not in acute distress.    Appearance: She is well-developed. She is not diaphoretic.  HENT:     Head: Normocephalic and atraumatic.  Cardiovascular:     Rate and Rhythm: Normal rate and regular rhythm.     Heart sounds: Normal heart sounds.  Pulmonary:     Effort: Pulmonary effort is normal.     Breath sounds: Normal breath sounds.  Abdominal:     Palpations: Abdomen is soft.     Tenderness: There is abdominal tenderness in the left lower  quadrant. There is no right CVA tenderness or left CVA tenderness.  Musculoskeletal:     Right lower leg: No edema.     Left lower leg: No edema.  Skin:    General: Skin is warm and dry.     Findings: No erythema or rash.  Neurological:     Mental Status: She is alert and oriented to person, place, and time.  Psychiatric:        Behavior: Behavior normal.     ED Results / Procedures / Treatments   Labs (all labs ordered are listed, but only abnormal results are displayed) Labs Reviewed  URINALYSIS, ROUTINE W REFLEX MICROSCOPIC - Abnormal; Notable for the following components:  Result Value   Hgb urine dipstick LARGE (*)    Ketones, ur 40 (*)    Leukocytes,Ua SMALL (*)    All other components within normal limits  CBC WITH DIFFERENTIAL/PLATELET - Abnormal; Notable for the following components:   WBC 11.3 (*)    Neutro Abs 8.3 (*)    Monocytes Absolute 1.1 (*)    All other components within normal limits  COMPREHENSIVE METABOLIC PANEL - Abnormal; Notable for the following components:   CO2 21 (*)    Glucose, Bld 103 (*)    Creatinine, Ser 1.11 (*)    Total Protein 8.2 (*)    GFR, Estimated 57 (*)    All other components within normal limits  PREGNANCY, URINE    EKG None  Radiology CT ABDOMEN PELVIS WO CONTRAST  Result Date: 05/20/2022 CLINICAL DATA:  Left lower quadrant abdominal pain. EXAM: CT ABDOMEN AND PELVIS WITHOUT CONTRAST TECHNIQUE: Multidetector CT imaging of the abdomen and pelvis was performed following the standard protocol without IV contrast. RADIATION DOSE REDUCTION: This exam was performed according to the departmental dose-optimization program which includes automated exposure control, adjustment of the mA and/or kV according to patient size and/or use of iterative reconstruction technique. COMPARISON:  MRI abdomen March 17, 2022 and CT abdomen pelvis June 20, 2018 FINDINGS: Lower chest: No acute abnormality. Hepatobiliary: Severe diffuse hepatic  steatosis. Gallbladder surgically absent. Similar prominence of the extrahepatic biliary tree with the common duct measuring 11 mm and gentle tapering of the duct to the level of the ampulla compatible with reservoir effect post cholecystectomy. Pancreas: No pancreatic ductal dilation or evidence of acute inflammation. Spleen: No splenomegaly. Adrenals/Urinary Tract: Bilateral adrenal glands appear normal. Left hydroureteronephrosis to a 9 mm stone at the L4-L5 vertebral body level. No right-sided hydronephrosis. Urinary bladder is unremarkable for degree of distension. Stomach/Bowel: Stomach is unremarkable for degree of distension. No pathologic dilation of small or large bowel. The appendix and terminal ileum appear normal. Scattered colonic diverticulosis without findings of acute diverticulitis. Vascular/Lymphatic: Normal caliber abdominal aorta. Smooth IVC contours. No pathologically enlarged abdominal or pelvic lymph nodes. Prominent right upper quadrant lymph nodes are similar prior and likely reactive in the setting of hepatic steatosis. Reproductive: Status post hysterectomy. No adnexal masses. Other: No significant abdominopelvic free fluid. Musculoskeletal: No acute osseous abnormality. Mild multilevel degenerative changes spine. Bilateral total hip arthroplasties. Chronic osseous changes of the pubic symphysis. IMPRESSION: 1. Left hydroureteronephrosis to a 9 mm stone at the L4-L5 vertebral body level. 2. Severe diffuse hepatic steatosis. 3. Scattered colonic diverticulosis without findings of acute diverticulitis. Electronically Signed   By: Dahlia Bailiff M.D.   On: 05/20/2022 17:37    Procedures Procedures    Medications Ordered in ED Medications  sodium chloride 0.9 % bolus 500 mL (500 mLs Intravenous New Bag/Given 05/20/22 1617)  ondansetron (ZOFRAN) injection 4 mg (4 mg Intravenous Given 05/20/22 1615)  fentaNYL (SUBLIMAZE) injection 50 mcg (50 mcg Intravenous Given 05/20/22 1616)    ED  Course/ Medical Decision Making/ A&P                           Medical Decision Making Amount and/or Complexity of Data Reviewed Labs: ordered. Radiology: ordered.  Risk Prescription drug management.   This patient presents to the ED for concern of left flank pain, this involves an extensive number of treatment options, and is a complaint that carries with it a high risk of complications and morbidity.  The differential diagnosis includes but not limited to diverticulitis, colitis, ureterolithiasis, urinary tract infection, pyelonephritis   Co morbidities that complicate the patient evaluation  MS, diverticulosis, hepatic steatosis, prediabetes, hyperlipidemia   Additional history obtained:  External records from outside source obtained and reviewed including prior labs on file.  Also review of urgent care note from visit today   Lab Tests:  I Ordered, and personally interpreted labs.  The pertinent results include: CBC with very mild leukocytosis white count 11.3.  His CMP with mild elevated creatinine at 1.11.  Urinalysis with large hemoglobin, ketones, small leukocytes, no bacteria seen.  hCG negative   Imaging Studies ordered:  I ordered imaging studies including CT abdomen pelvis without contrast I independently visualized and interpreted imaging which showed 9 mm left ureteral stone I agree with the radiologist interpretation   Consultations Obtained:  I requested consultation with the urology, Dr. Milford Cage,  and discussed lab and imaging findings as well as pertinent plan - they recommend: NPO after midnight, call office first thing in the morning, may be able to stent. Flomax, pain control (patient with rx for Norco already, tongue swelling with NSAIDs, will hold on Toradol as she has never had this). Zofran.    Problem List / ED Course / Critical interventions / Medication management  62 year old female with complaint of left flank pain as above.  Found to have  left lower quadrant tenderness on exam.  Pain is controlled with 1 dose of fentanyl, given IV Zofran and IV fluids in the ER.  Found to have a large left stone at 9 mm at the L4-L5 level.  Urine with small leukocytes, no bacteria.  White count mildly elevated 11.3.  Renal function minimally elevated at 1.11.  Case was discussed with Dr. Milford Cage with urology, patient is pain controlled, may be discharged with return to ER precautions including fever, worsening pain or vomiting.  Recommend [facility] if possible in this scenario.  Discussed with patient, can return to any ER however encouraged [facility] if possible.  Patient to be n.p.o. after midnight to call office in the morning for appointment, may possibly be able to get a stent tomorrow.  Patient agreeable with plan of care. I ordered medication including Zofran, fentanyl, IV fluids for pain Reevaluation of the patient after these medicines showed that the patient improved I have reviewed the patients home medicines and have made adjustments as needed   Social Determinants of Health:  Has PCP   Test / Admission - Considered:  Stable for discharge with plan to follow-up with urology tomorrow as above         Final Clinical Impression(s) / ED Diagnoses Final diagnoses:  Ureterolithiasis    Rx / DC Orders ED Discharge Orders          Ordered    tamsulosin (FLOMAX) 0.4 MG CAPS capsule  Daily        05/20/22 1813    ondansetron (ZOFRAN-ODT) 4 MG disintegrating tablet  Every 8 hours PRN        05/20/22 1813              Letina, Luckett, PA-C 05/20/22 1829    Audley Hose, MD 05/21/22 0210

## 2022-05-20 NOTE — Discharge Instructions (Signed)
Flomax daily.  Zofran as needed as prescribed for nausea and vomiting.  Continue with your pain medication as prescribed. Return to the ER at anytime for fevers or for pain or vomiting not controlled with medications.  Present to any emergency room if needed however prefer [facility] if possible.  Call urology office first thing in the morning tomorrow, they may be able to do a stent tomorrow.  Do not eat or drink anything after midnight tonight.  Be sure to have a driver with you for your appointment tomorrow.

## 2022-05-20 NOTE — ED Triage Notes (Addendum)
[facility] presents to [facility] with co of L sided rib pain that feels like a knife sticking in her side, pt reports it started 4 days ago and she toom MOM and went to the bathroom for 3 days and then the pain came back last night. Pt reports pain is constant. Since starting back up. Pt also reports nausea and vomiting. Pt reports no dysuria.   Pt reports she had a cononoscopy 3 years ago that said she had diverticulitis but has never had symptoms. Pt also recently scaned bc her liver enzymes where increased but the scan came back normal

## 2022-05-20 NOTE — ED Triage Notes (Signed)
Pt c/o L flank pain "started last Tuesday, went away, came back this Tuesday & getting worse." Denies urinary symptoms, last BM Monday  Pt states "it feels like a knife sticking in me."

## 2022-05-20 NOTE — ED Provider Notes (Signed)
[facility]    CSN: [id number] Arrival date & time: 05/20/22  1330      History   Chief Complaint Chief Complaint  Patient presents with   Abdominal Pain    Pain in left side - Entered by patient    HPI CARLEE VONDERHAAR is a 62 y.o. female.   Patient here today for 10/10 left lower quadrant abdominal pain that started about 4 days ago but seems to be worsening. She reports she feels as if a knife is stabbing her in her left side. She has take milk of magnesia and went to the bathroom for 3 days but then pain returned last night. She does have history of diverticulitis. She reports some associated nausea and vomiting. She denies dysuria.   The history is provided by the patient.  Abdominal Pain Associated symptoms: nausea and vomiting   Associated symptoms: no chills, no cough, no diarrhea, no dysuria, no fever and no shortness of breath     Past Medical History:  Diagnosis Date   Allergy    Anxiety    Avascular necrosis (HCC)    Back pain    Cancer (HCC)    Chest pain    Chewing difficulty    Chronic fatigue syndrome    Colon polyp    pre-cancerous   Complication of anesthesia    patient states she requires more anesthesia   Depression    Disorder of soft tissue    Diverticulosis    Dizziness    Edema, lower extremity    Fatty liver    Fibromyalgia    GERD (gastroesophageal reflux disease)    H/O breast biopsy    pre- cancerous cells   Hand fracture, left    9/87   Heart palpitations    Heart valve disease    History of Holter monitoring    Hyperlipidemia    Insomnia    Joint pain    Lactose intolerance    Lhermitte's syndrome    Migraines    Multiple food allergies    Multiple sclerosis (HCC)    dx in 11/1996   Multiple sclerosis (HCC)    Neuromuscular disorder (HCC)    Neuropathy    Numbness and tingling in hands    Obesity    Optic neuritis    OSA (obstructive sleep apnea)    no longer have OSA since stopped taking a MS medication    Osteoarthritis    Otitis media    Palpitations    Prediabetes    Raynaud's phenomenon    Shortness of breath    Sleep apnea    Due to medication that she no longer takes.   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   Chronic prescription opiate use 12/17/2020   Numbness 12/17/2020   Arthritis 11/26/2020   Irritable bowel syndrome 11/26/2020   At risk for heart disease 04/15/2020   Mixed hyperlipidemia 04/15/2020   Cystoid macular edema of right eye 03/25/2020   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   Secondary open-angle glaucoma of right eye, 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   Prediabetes 01/01/2020  Vitamin D deficiency disease 01/01/2020   Vitamin B 12 deficiency 01/01/2020   At risk for diabetes mellitus 01/01/2020   Class 2 severe obesity due to excess calories with serious comorbidity and body mass index (BMI) of 36.0 to 36.9 in adult ([place]) 01/01/2020   Anosmia 11/15/2019   Disturbance of smell and taste 11/15/2019   Epiretinal membrane, right 07/03/2019   Depression 03/06/2019   Diverticulosis 06/14/2018   Hepatic steatosis 06/14/2018   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   Decreased pedal pulses 06/30/2016   Pain in both lower extremities 06/30/2016   Facial pain, atypical 06/08/2016   Fibroids 02/12/2016   Chest pain 07/07/2015   Dizziness and giddiness 07/07/2015   Leiomyoma of uterus 07/07/2015   Lower abdominal pain 07/07/2015   Hip pain 03/03/2015   Optic neuritis 06/27/2014   Multiple sclerosis ([place]) 06/26/2014   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/26/2014   Other fatigue 06/26/2014    Dysesthesia 06/26/2014   Depression with anxiety 06/26/2014   Insomnia 06/26/2014   Low serum vitamin D 06/26/2014    Past Surgical History:  Procedure Laterality Date   ABDOMINAL HYSTERECTOMY Bilateral 02/12/2016   Procedure: HYSTERECTOMY ABDOMINAL WITH BILATERAL SALPINGO OOPHERECTOMY;  Surgeon: Dian Queen, MD;  Location: [facility];  Service: Gynecology;  Laterality: Bilateral;   BREAST EXCISIONAL BIOPSY     core/left breast   CATARACT EXTRACTION, BILATERAL  06/2019   CHOLECYSTECTOMY  1983   COLONOSCOPY     1997/2001   DILATION AND CURETTAGE OF UTERUS     ENDOMETRIAL ABLATION  2012   LAPAROSCOPIC ABDOMINAL EXPLORATION     ovaries and intestines bond together   LEFT HEART CATHETERIZATION WITH CORONARY ANGIOGRAM N/A 06/21/2011   Procedure: LEFT HEART CATHETERIZATION WITH CORONARY ANGIOGRAM;  Surgeon: Leonie Man, MD;  Location: [facility] CATH [facility];  Service: Cardiovascular;  Laterality: N/A;   LUMBAR PUNCTURE     11/01/1996   SALPINGOOPHORECTOMY Bilateral 02/12/2016   Procedure: BILATERAL SALPINGO OOPHORECTOMY;  Surgeon: Dian Queen, MD;  Location: [facility];  Service: Gynecology;  Laterality: Bilateral;   TOTAL HIP ARTHROPLASTY     left hip  6/02  /right hip12/04    OB History     Gravida  2   Para  2   Term      Preterm      AB      Living         SAB      IAB      Ectopic      Multiple      Live Births               Home Medications    Prior to Admission medications   Medication Sig Start Date End Date Taking? Authorizing Provider  ADVANCED EVENING PRIMROSE OIL PO Take 1 capsule by mouth daily. Total 1300 mg    [provider]  Ascorbic Acid (VITAMIN C) 1000 MG tablet Take 1,000 mg by mouth daily.    [provider]  aspirin EC 81 MG tablet Take 81 mg by mouth daily.    [provider]  AZO-CRANBERRY PO Take 1 capsule by mouth daily at 12 noon.    [provider]  baclofen (LIORESAL) 10 MG tablet TAKE 2 TABLETS BY  MOUTH 3 TIMES DAILY. 07/16/21   Sater, Nanine Means, MD  BLACK ELDERBERRY PO Take 300 mg by mouth in the morning and at bedtime.    [provider]  Cholecalciferol (VITAMIN D) 50 MCG (2000 UT) CAPS Take  2,000 Units by mouth daily.     [provider]  CINNAMON PO Take 1,200 mg by mouth daily.     [provider]  diphenhydrAMINE (BENADRYL) 25 MG tablet Take 25 mg by mouth 3 (three) times daily. Takes with Hydrocodone    [provider]  doxepin (SINEQUAN) 10 MG capsule TAKE 1-2 CAPSULES (10-20 MG TOTAL) BY MOUTH AT BEDTIME. 01/11/22   Sater, Nanine Means, MD  famotidine (PEPCID) 20 MG tablet TAKE 1 TABLET BY MOUTH TWICE A DAY 04/07/22   [name], MD  fexofenadine (ALLEGRA) 180 MG tablet Take 1 tablet (180 mg total) by mouth daily. 10/21/21   [name], MD  Flaxseed, Linseed, (FLAX SEED OIL) 1000 MG CAPS Take 1,200 mg by mouth daily.     [provider]  gabapentin (NEURONTIN) 300 MG capsule Take 1 capsule (300 mg total) by mouth 3 (three) times daily. 01/13/22   Lomax, Amy, NP  HYDROcodone-acetaminophen (NORCO/VICODIN) 5-325 MG tablet Take 1 tablet by mouth 3 (three) times daily as needed for moderate pain. 04/22/22   Sater, Nanine Means, MD  ipratropium (ATROVENT) 0.06 % nasal spray Use 2 sprays each nostril bid prn drainage 10/16/20   [name], MD  L-Lysine 1000 MG TABS Take by mouth. As needed    [provider]  Lactobacillus (PROBIOTIC ACIDOPHILUS PO) Take by mouth daily. Digestive Advantage BC   bacillus coagulans    [provider]  levocetirizine (XYZAL) 5 MG tablet TAKE 1 TABLET BY MOUTH EVERY DAY IN THE EVENING 04/07/22   [name], MD  LORazepam (ATIVAN) 1 MG tablet Take 1 tablet (1 mg total) by mouth at bedtime. 03/06/19   Sater, Nanine Means, MD  Lutein-Bilberry (BILBERRY PLUS LUTEIN PO) Take by mouth daily.    [provider]  Melatonin 10 MG TABS Take 1 tablet by mouth at bedtime.     [provider]   methylphenidate (CONCERTA) 27 MG PO CR tablet Take 1 tablet (27 mg total) by mouth every morning. 11/12/21   Sater, Nanine Means, MD  Moringa 500 MG CAPS Take 2 capsules by mouth daily.    [provider]  Multiple Vitamins-Minerals (HAIR/SKIN/NAILS/BIOTIN) TABS Take by mouth daily.     [provider]  Nutritional Supplements (NUTRITIONAL SUPPLEMENT PO) Take 2.1 g by mouth in the morning and at bedtime. LION'S MANE    [provider]  nystatin-triamcinolone ointment (MYCOLOG) Apply 1 application topically 2 (two) times daily. 01/31/20   Minette Brine, FNP  ondansetron (ZOFRAN-ODT) 8 MG disintegrating tablet Take 1 tablet (8 mg total) by mouth every 8 (eight) hours as needed for nausea or vomiting. 12/16/21   Lomax, Amy, NP  rizatriptan (MAXALT-MLT) 10 MG disintegrating tablet Take 1 tablet (10 mg total) by mouth as needed for migraine. May repeat in 2 hours if needed.  Max of 2 tablets in 24 hours, and 10 tablets per month 12/10/21   Sater, Nanine Means, MD  tiZANidine (ZANAFLEX) 2 MG tablet TAKE UP TO 4 TABLETS BY MOUTH EVERY DAY 11/05/21   Sater, Nanine Means, MD  Vitamin D, Ergocalciferol, (DRISDOL) 1.25 MG (50000 UNIT) CAPS capsule ONE CAPSULE TWICE WEEKLY ON TUESDAYS/FRIDAYS 03/31/22   [name], MD  vitamin E 1000 UNIT capsule vitamin E 670 mg (1,000 unit) capsule   1 capsule every day by oral route.    [provider]  zolpidem (AMBIEN) 10 MG tablet Take 10 mg by mouth at bedtime as needed. For sleep  [provider]    Family History Family History  Problem Relation Age of Onset   Pancreatic cancer Mother    Stroke Mother    Hypertension Mother    Hyperlipidemia Mother    Heart disease Mother    Cancer Mother    Depression Mother    Anxiety disorder Mother    Alcoholism Mother    Testicular cancer Brother    Colon cancer Maternal Uncle    Pancreatic cancer Paternal Uncle    Prostate cancer Maternal Grandfather    Colon polyps Father     Diabetes Father    Hypertension Father    Hyperlipidemia Father    Kidney disease Father    Sleep apnea Father    Obesity Father     Social History Social History   Tobacco Use   Smoking status: Former    Packs/day: 0.25    Years: 8.00    Total pack years: 2.00    Types: Cigarettes    Quit date: 07/01/1995    Years since quitting: 26.9   Smokeless tobacco: Never  Vaping Use   Vaping Use: Never used  Substance Use Topics   Alcohol use: No    Alcohol/week: 0.0 standard drinks of alcohol   Drug use: Yes    Types: Hydrocodone     Allergies   Diclofenac, Lamictal [lamotrigine], Mobic [meloxicam], Ultram [tramadol hcl], Nefazodone, Banana, Beef-derived products, Betaseron [interferon beta-1b], Codeine, Copaxone [glatiramer acetate], Gabapentin, Latex, Nitrofurantoin, Oxcarbazepine, Pork-derived products, Teriflunomide, Trazodone and nefazodone, and Tape   Review of Systems Review of Systems  Constitutional:  Negative for chills and fever.  HENT:  Negative for congestion and ear pain.   Eyes:  Negative for discharge and redness.  Respiratory:  Negative for cough, shortness of breath and wheezing.   Gastrointestinal:  Positive for abdominal pain, nausea and vomiting. Negative for diarrhea.  Genitourinary:  Negative for dysuria.     Physical Exam Triage Vital Signs ED Triage Vitals  Enc Vitals Group     BP 05/20/22 1439 (!) 153/83     Pulse Rate 05/20/22 1438 91     Resp 05/20/22 1438 19     Temp 05/20/22 1438 98.2 F (36.8 C)     Temp src --      SpO2 05/20/22 1438 95 %     Weight --      Height --      Head Circumference --      Peak Flow --      Pain Score 05/20/22 1434 10     Pain Loc --      Pain Edu? --      Excl. in [place]? --    No data found.  Updated Vital Signs BP (!) 153/83   Pulse 91   Temp 98.2 F (36.8 C)   Resp 19   LMP 04/09/2013   SpO2 95%      Physical Exam Vitals and nursing note reviewed.  Constitutional:      General: She is not  in acute distress.    Appearance: Normal appearance. She is not ill-appearing.  HENT:     Head: Normocephalic and atraumatic.  Eyes:     Conjunctiva/sclera: Conjunctivae normal.  Cardiovascular:     Rate and Rhythm: Normal rate and regular rhythm.     Heart sounds: Normal heart sounds.  Pulmonary:     Effort: Pulmonary effort is normal. No respiratory distress.     Breath sounds: Normal breath sounds. No wheezing, rhonchi  or rales.  Abdominal:     General: There is distension (mild).     Tenderness: There is abdominal tenderness (LLQ). There is guarding. There is no rebound.  Neurological:     Mental Status: She is alert.  Psychiatric:        Mood and Affect: Mood normal.        Behavior: Behavior normal.        Thought Content: Thought content normal.      UC Treatments / Results  Labs (all labs ordered are listed, but only abnormal results are displayed) Labs Reviewed - No data to display  EKG   Radiology No results found.  Procedures Procedures (including critical care time)  Medications Ordered in UC Medications - No data to display  Initial Impression / Assessment and Plan / UC Course  I have reviewed the triage vital signs and the nursing notes.  Pertinent labs & imaging results that were available during my care of the patient were reviewed by me and considered in my medical decision making (see chart for details).    Recommended further evaluation in the ED for stat labs and imaging. Patient is agreeable to same and states she feels safe transferring herself via POV.   Final Clinical Impressions(s) / UC Diagnoses   Final diagnoses:  LLQ pain     Discharge Instructions       Please report to  [address] [place], [place] [address]     ED Prescriptions   None    PDMP not reviewed this encounter.   Francene Finders, PA-C 05/20/22 1452

## 2022-05-21 ENCOUNTER — Telehealth: Payer: Self-pay

## 2022-05-21 ENCOUNTER — Encounter (HOSPITAL_BASED_OUTPATIENT_CLINIC_OR_DEPARTMENT_OTHER): Payer: Self-pay | Admitting: Urology

## 2022-05-21 ENCOUNTER — Other Ambulatory Visit: Payer: Self-pay | Admitting: Urology

## 2022-05-21 NOTE — Telephone Encounter (Signed)
Transition Care Management Follow-up Telephone Call Date of discharge and from where: 05/20/2022 [facility]  How have you been since you were released from the hospital? Pt states she is feeling better today than she did yesterday. She has a procedure sch for Monday, LEFT EXTRACORPOREAL SHOCK WAVE LITHOTRIPSY.  Any questions or concerns? No  Items Reviewed: Did the pt receive and understand the discharge instructions provided? Yes  Medications obtained and verified? Yes  Other? Yes  Any new allergies since your discharge? No  Dietary orders reviewed? Yes Do you have support at home? Yes   Home Care and Equipment/Supplies: Were home health services ordered? no If so, what is the name of the agency? N/a  Has the agency set up a time to come to the patient's home? no Were any new equipment or medical supplies ordered?  No What is the name of the medical supply agency? N/a Were you able to get the supplies/equipment? no Do you have any questions related to the use of the equipment or supplies? No  Functional Questionnaire: (I = Independent and D = Dependent) ADLs: i  Bathing/Dressing- i  Meal Prep- i  Eating- i  Maintaining continence- i  Transferring/Ambulation- i  Managing Meds- i  Follow up appointments reviewed:  PCP Hospital f/u appt confirmed? No  Scheduled to see n/a on n/a @ n/a. [facility] f/u appt confirmed? Yes  Scheduled to see n/a on Monday  @ 10:00AM. Are transportation arrangements needed? No  If their condition worsens, is the pt aware to call PCP or go to the Emergency Dept.? Yes Was the patient provided with contact information for the PCP's office or ED? Yes Was to pt encouraged to call back with questions or concerns? Yes

## 2022-05-24 ENCOUNTER — Encounter (HOSPITAL_BASED_OUTPATIENT_CLINIC_OR_DEPARTMENT_OTHER): Payer: Self-pay | Admitting: Urology

## 2022-05-24 ENCOUNTER — Encounter (HOSPITAL_BASED_OUTPATIENT_CLINIC_OR_DEPARTMENT_OTHER)
Admission: RE | Disposition: A | Discharge status: Home / Self Care | Payer: Self-pay | Source: Home / Self Care | Attending: Urology

## 2022-05-24 ENCOUNTER — Ambulatory Visit (HOSPITAL_BASED_OUTPATIENT_CLINIC_OR_DEPARTMENT_OTHER)
Admission: RE | Admit: 2022-05-24 | Discharge: 2022-05-24 | Disposition: A | Discharge status: Home / Self Care | Payer: Medicare Other | Attending: Urology | Admitting: Urology

## 2022-05-24 ENCOUNTER — Ambulatory Visit (HOSPITAL_COMMUNITY): Payer: Medicare Other

## 2022-05-24 ENCOUNTER — Ambulatory Visit: Payer: Medicare Other | Admitting: Internal Medicine

## 2022-05-24 DIAGNOSIS — G35 Multiple sclerosis: Secondary | ICD-10-CM | POA: Diagnosis not present

## 2022-05-24 DIAGNOSIS — G4730 Sleep apnea, unspecified: Secondary | ICD-10-CM | POA: Insufficient documentation

## 2022-05-24 DIAGNOSIS — N200 Calculus of kidney: Secondary | ICD-10-CM

## 2022-05-24 DIAGNOSIS — G8929 Other chronic pain: Secondary | ICD-10-CM | POA: Insufficient documentation

## 2022-05-24 DIAGNOSIS — E669 Obesity, unspecified: Secondary | ICD-10-CM | POA: Insufficient documentation

## 2022-05-24 DIAGNOSIS — Z7982 Long term (current) use of aspirin: Secondary | ICD-10-CM | POA: Diagnosis not present

## 2022-05-24 DIAGNOSIS — N201 Calculus of ureter: Secondary | ICD-10-CM | POA: Diagnosis not present

## 2022-05-24 DIAGNOSIS — Z6835 Body mass index (BMI) 35.0-35.9, adult: Secondary | ICD-10-CM | POA: Insufficient documentation

## 2022-05-24 HISTORY — PX: EXTRACORPOREAL SHOCK WAVE LITHOTRIPSY: SHX1557

## 2022-05-24 SURGERY — LITHOTRIPSY, ESWL
Anesthesia: LOCAL | Laterality: Left

## 2022-05-24 MED ORDER — CIPROFLOXACIN HCL 500 MG PO TABS
ORAL_TABLET | ORAL | Status: AC
  Filled 2022-05-24: qty 1

## 2022-05-24 MED ORDER — DIPHENHYDRAMINE HCL 25 MG PO CAPS
ORAL_CAPSULE | ORAL | Status: AC
  Filled 2022-05-24: qty 1

## 2022-05-24 MED ORDER — DIAZEPAM 5 MG PO TABS
10.0000 mg | ORAL_TABLET | ORAL | Status: AC
  Administered 2022-05-24: 10 mg via ORAL

## 2022-05-24 MED ORDER — CIPROFLOXACIN HCL 500 MG PO TABS
500.0000 mg | ORAL_TABLET | ORAL | Status: AC
  Administered 2022-05-24: 500 mg via ORAL

## 2022-05-24 MED ORDER — DIPHENHYDRAMINE HCL 25 MG PO CAPS
25.0000 mg | ORAL_CAPSULE | ORAL | Status: AC
  Administered 2022-05-24: 25 mg via ORAL

## 2022-05-24 MED ORDER — DIAZEPAM 5 MG PO TABS
ORAL_TABLET | ORAL | Status: AC
  Filled 2022-05-24: qty 2

## 2022-05-24 MED ORDER — SODIUM CHLORIDE 0.9 % IV SOLN: INTRAVENOUS | Status: DC

## 2022-05-24 NOTE — Discharge Instructions (Signed)
See [facility] discharge instructions in chart.  

## 2022-05-24 NOTE — H&P (Signed)
62 year old female presents today for evaluation of left 9 mm mid ureteral stone.   The patient's symptoms began about a week ago. She had 2 days of pain. She thought it was related to constipation. She does have chronic pain and was taking hydrocodone for it. The pain subsequently improved. She was doing otherwise reasonably well and then again 2 days ago developed worsening pain. She was having associated nausea and vomiting. She denies any fevers or chills. She did present to the emergency department where she had a CT scan that demonstrated a 9 mm mid ureteral stone on the left side. She is here today for further management. She denies any gross hematuria or dysuria. Her pain currently is well-managed.   Patient has a history of MS. She has never had a kidney stone.     ALLERGIES:    Banana Codeine Copaxone Diclofenac Gabapentin Lamictal Latex Mobic Nefazodone Nitrofurantoin Oxcarbazepine Pork Derived Products  Trazodone Ultram    MEDICATIONS: Tamsulosin Hcl 0.4 mg capsule  Adult Low Dose Aspirin Ec 81 mg tablet, delayed release  Azo Cranberry  Baclofen 10 mg tablet  Benadryl  Black Elderberry  Cinnamon  Doxepin Hcl 10 mg capsule  Evening Primrose Oil  Famotidine 20 mg tablet  Fexofenadine Hcl 180 mg tablet  Flaxseed Oil 1,000 mg capsule  Gabapentin 300 mg capsule  Hydrocodone-Acetaminophen 5 mg-325 mg tablet  Ipratropium Bromide 42 mcg (0.06 %) aerosol, spray  Lactobacillus  Levocetirizine Dihydrochloride 5 mg tablet  L-Lysine 1,000 mg tablet  Lorazepam 1 mg tablet  Lorazepam 1 mg tablet  Lutein-Bilberry  Melatonin 10 mg tablet  Methylphenidate  Moringa 500 Mg  Multiple Vitamins-Minerals  Nutritional Supplements  Nystatin-Triamcinolone 100,000 unit/gram-0.1 % ointment  Ondansetron Hcl 4 mg tablet  Rizatriptan 10 mg tablet,disintegrating  Tizanidine Hcl 2 mg tablet  Vitamin C 1,000 mg tablet  Vitamin D  Vitamin D  Zolpidem Tartrate 10 mg tablet     GU  PSH: Hysterectomy, 2017       [place] Notes: Rip Hip (2004), Left hip (2006), Salpingo-oophorectomy (2017)   NON-GU PSH: Cataract Surgery.., 2021 Remove Gallbladder, 1983     GU PMH: None   NON-GU PMH: Anxiety Arthritis Depression GERD Glaucoma Hypercholesterolemia Multiple sclerosis Sleep Apnea    FAMILY HISTORY: 1 Daughter - Daughter 1 son - Son   SOCIAL HISTORY: Marital Status: Widowed Preferred Language: English; Ethnicity: Not Hispanic Or Latino; Race: White Current Smoking Status: Patient has never smoked.   Tobacco Use Assessment Completed: Used Tobacco in last 30 days? Has never drank.  Does not drink caffeine. Patient's occupation is/was Disabled.    REVIEW OF SYSTEMS:    GU Review Female:   Patient reports hard to postpone urination, get up at night to urinate, and leakage of urine. Patient denies frequent urination, burning /pain with urination, stream starts and stops, trouble starting your stream, have to strain to urinate, and being pregnant.  Gastrointestinal (Upper):   Patient reports nausea, vomiting, and indigestion/ heartburn.   Gastrointestinal (Lower):   Patient reports diarrhea and constipation.   Constitutional:   Patient reports night sweats and fatigue. Patient denies fever and weight loss.  Skin:   Patient denies skin rash/ lesion and itching.  Eyes:   Patient denies blurred vision and double vision.  Ears/ Nose/ Throat:   Patient denies sore throat and sinus problems.  Hematologic/Lymphatic:   Patient reports easy bruising. Patient denies swollen glands.  Cardiovascular:   Patient denies leg swelling and chest pains.  Respiratory:  Patient denies cough and shortness of breath.  Endocrine:   Patient denies excessive thirst.  Musculoskeletal:   Patient reports back pain and joint pain.   Neurological:   Patient reports headaches and dizziness.   Psychologic:   Patient reports depression and anxiety.    VITAL SIGNS:      05/21/2022 10:52 AM   Weight 228 lb / 103.42 kg  BP 130/85 mmHg  Pulse 93 /min  Temperature 97.7 F / 36.5 C   MULTI-SYSTEM PHYSICAL EXAMINATION:    Constitutional: Well-nourished. No physical deformities. Normally developed. Good grooming.  Neck: Neck symmetrical, not swollen. Normal tracheal position.  Respiratory: Normal breath sounds. No labored breathing, no use of accessory muscles.   Cardiovascular: Regular rate and rhythm. No murmur, no gallop. Normal temperature, normal extremity pulses, no swelling, no varicosities.   Lymphatic: No enlargement of neck, axillae, groin.  Skin: No paleness, no jaundice, no cyanosis. No lesion, no ulcer, no rash.  Neurologic / Psychiatric: Oriented to time, oriented to place, oriented to person. No depression, no anxiety, no agitation.  Gastrointestinal: No mass, no tenderness, no rigidity, non obese abdomen.  Eyes: Normal conjunctivae. Normal eyelids.  Ears, Nose, Mouth, and Throat: Left ear no scars, no lesions, no masses. Right ear no scars, no lesions, no masses. Nose no scars, no lesions, no masses. Normal hearing. Normal lips.  Musculoskeletal: Normal gait and station of head and neck.     Complexity of Data:  Source Of History:  Patient  Records Review:   Previous Doctor Records, Previous Hospital Records, Previous Patient Records, POC Tool  Urine Test Review:   Urinalysis  X-Ray Review: KUB: Reviewed Films. Discussed With Patient.  C.T. Chest/ Abd/Pelvis: Reviewed Films. Discussed With Patient.     PROCEDURES:         KUB - [id number]  A single view of the abdomen is obtained. Renal shadows are easily visualized bilaterally. There are no stones appreciated within the expected location in either renal pelvis.  There is a 9 mm opacification lateral to the left L4 transverse process consistent with the patient's stone.  There are no additional calcifications along the expected location of either ureter bilaterally.  Gas pattern is grossly normal. No significant  bony abnormalities.      Impression:  There is a 9 mm opacification lateral to the left L4 transverse process consistent with the patient's stone.          Urinalysis w/Scope Dipstick Dipstick Cont'd Micro  Color: Amber Bilirubin: 1+ mg/dL WBC/hpf: 6 - 10/hpf  Appearance: Slightly Cloudy Ketones: 1+ mg/dL RBC/hpf: 10 - 20/hpf  Specific Gravity: 1.025 Blood: 3+ ery/uL Bacteria: Few (10-25/hpf)  pH: <=5.0 Protein: Neg mg/dL Cystals: NS (Not Seen)  Glucose: Neg mg/dL Urobilinogen: 0.2 mg/dL Casts: Hyaline    Nitrites: Neg Trichomonas: Not Present    Leukocyte Esterase: 1+ leu/uL Mucous: Not Present      Epithelial Cells: 0 - 5/hpf      Yeast: NS (Not Seen)      Sperm: Not Present    ASSESSMENT:      ICD-10 Details  1 GU:   Ureteral calculus - N20.1    PLAN:            Medications New Meds: Cephalexin 500 mg capsule 1 capsule PO TID   #21  0 Refill(s)  Pharmacy Name:  [id number] Address:  [address]  [place] [address] Phone:  [phone] Fax:  [phone]  Orders Labs Urine Culture  X-Rays: KUB          Schedule Return Visit/Planned Activity: ASAP - Schedule Surgery          Document Letter(s):  Created for Patient: Clinical Summary         Notes:   The patient has a 9 mm left mid ureteral stone. Today her pain is very well-controlled. She has no signs or symptoms of infection. I did send the urine analysis today for culture. Otherwise, I think the patient is suitable for outpatient management. We discussed management strategies. Having gone through all the treatment options the patient has opted to proceed with shockwave lithotripsy. I spoke to her about the associated risk and benefits. She understands the potential that this procedure would not fragment her stones in an adequate fashion so that she is able to pass some. She understands that that may require an additional procedure. We also discussed the possibility of her  having increased amounts of pain process to try to pass the stones. Having gone through all of that, the patient is opted to proceed with shockwave lithotripsy. Will try to get her scheduled quickly.

## 2022-05-24 NOTE — Op Note (Signed)
See [facility] OP note scanned into chart. Also because of the size, density, location and other factors that cannot be anticipated I feel this will likely be a staged procedure. This fact supersedes any indication in the scanned [facility] operative note to the contrary.  

## 2022-05-25 ENCOUNTER — Encounter (HOSPITAL_BASED_OUTPATIENT_CLINIC_OR_DEPARTMENT_OTHER): Payer: Self-pay | Admitting: Urology

## 2022-06-01 ENCOUNTER — Encounter: Payer: Self-pay | Admitting: Neurology

## 2022-06-01 ENCOUNTER — Telehealth: Payer: Self-pay | Admitting: Neurology

## 2022-06-01 NOTE — Telephone Encounter (Signed)
LVM, sent mychart msg and letter informing pt of appt change- MD out.

## 2022-06-02 NOTE — Progress Notes (Signed)
[employer] PREP Weekly Session  Patient Details  Name: Grace [name] MRN: [id number] Date of Birth: 12/05/1960 Age: 62 y.o. PCP: [name], MD  Vitals:   06/01/22 1430  Weight: 221 lb 3.2 oz (100.3 kg)     [facility] Weekly seesion - 06/02/22 1400       [facility] "PREP" Location   [facility] "PREP" Location [facility]      Weekly Session   Topic Discussed Finding support    Minutes exercised this week 600 minutes    Classes attended to date [place] 06/02/2022, 2:14 PM

## 2022-06-04 ENCOUNTER — Encounter: Payer: Self-pay | Admitting: Gastroenterology

## 2022-06-10 NOTE — Progress Notes (Signed)
[facility] PREP Weekly Session  Patient Details  Name: UMAIMA SCHOLTEN MRN: [id number] Date of Birth: [birth year] Age: 62 y.o. PCP: [name], MD  Vitals:   06/08/22 1430  Weight: 218 lb 9.6 oz (99.2 kg)     [facility] Weekly seesion - 06/10/22 1800       [facility] "PREP" Location   [facility] "PREP" Location [facility]      Weekly Session   Topic Discussed Calorie breakdown    Minutes exercised this week 380 minutes    Classes attended to date 13             Barnett Hatter 06/10/2022, 6:05 PM

## 2022-06-14 ENCOUNTER — Telehealth: Payer: Self-pay

## 2022-06-14 NOTE — Progress Notes (Signed)
   Care Guide Note  06/14/2022 Name: Grace [name] MRN: [id number] DOB: 08/27/1960  Referred by: [name], MD Reason for referral : No chief complaint on file.   Grace [name] is a 62 y.o. year old female who is a primary care patient of [name], MD. [name] was referred to the pharmacist for assistance related to DM.    An unsuccessful telephone outreach was attempted today to contact the patient who was referred to the pharmacy team for assistance with medication management. Additional attempts will be made to contact the patient.   [name], [place], [place] [address] Direct Dial: [phone] [email or web address]

## 2022-06-18 NOTE — Progress Notes (Signed)
   Care Guide Note  06/18/2022 Name: [name] MRN: [id number] DOB: 1960-12-05  Referred by: [name], MD Reason for referral : Care Coordination (Outreach to schedule f/u with [facility] transition from [place])   Grace [name] is a 62 y.o. year old female who is a primary care patient of [name], MD. Priscilla Kirstein Mizuno was referred to the pharmacist for assistance related to DM.    Successful contact was made with the patient to discuss pharmacy services including being ready for the pharmacist to call at least 5 minutes before the scheduled appointment time, to have medication bottles and any blood sugar or blood pressure readings ready for review. The patient agreed to meet with the pharmacist via with the pharmacist via telephone visit on (date/time).  11/16/2022  [name], [place], [place] [address] Direct Dial: [phone] [email or web address]

## 2022-06-21 NOTE — Progress Notes (Signed)
[facility] PREP Weekly Session  Patient Details  Name: Grace [name] MRN: [id number] Date of Birth: Sep 09, 1960 Age: 62 y.o. PCP: [name], MD  Vitals:   06/15/22 1430  Weight: 221 lb 6.4 oz (100.4 kg)     [facility] Weekly seesion - 06/21/22 0900       [facility] "PREP" Location   [facility] "PREP" Location [facility]      Weekly Session   Topic Discussed --   review, setting goals   Minutes exercised this week 750 minutes    Classes attended to date 15             Barnett Hatter 06/21/2022, 9:28 AM

## 2022-06-22 ENCOUNTER — Telehealth: Payer: Self-pay

## 2022-06-22 NOTE — Telephone Encounter (Signed)
-----   Message from Marice Potter, RN sent at 03/22/2022  1:22 PM EST ----- Regarding: labs Pt needs repeat LFTs, order placed.

## 2022-06-22 NOTE — Telephone Encounter (Signed)
Left detailed message on patinet's voicemail letting her know about lab.

## 2022-06-23 NOTE — Progress Notes (Signed)
[facility] PREP Weekly Session  Patient Details  Name: Grace [name] MRN: [id number] Date of Birth: 1961/02/26 Age: 62 y.o. PCP: [name], MD  Vitals:   06/22/22 1430  Weight: 220 lb (99.8 kg)     [facility] Weekly seesion - 06/23/22 1600       [facility] "PREP" Location   [facility] "PREP" Location [facility]      Weekly Session   Topic Discussed --   carbs, proteins and fats, ways to fit in exercise   Minutes exercised this week 720 minutes    Classes attended to date [place] 06/23/2022, 4:23 PM

## 2022-06-29 ENCOUNTER — Ambulatory Visit: Payer: Medicare Other | Admitting: Neurology

## 2022-06-29 NOTE — Progress Notes (Signed)
[facility] PREP Weekly Session  Patient Details  Name: Grace [name] MRN: [id number] Date of Birth: 06/30/1960 Age: 62 y.o. PCP: [name], MD  Vitals:   06/29/22 1430  Weight: 219 lb (99.3 kg)     [facility] Weekly seesion - 06/29/22 1600       [facility] "PREP" Location   [facility] "PREP" Location [facility]      Weekly Session   Topic Discussed --   Goals and activity worksheet   Minutes exercised this week 780 minutes    Classes attended to date 18             Barnett Hatter 06/29/2022, 4:29 PM

## 2022-07-06 NOTE — Progress Notes (Signed)
[facility] PREP Weekly Session  Patient Details  Name: Grace [name] MRN: [id number] Date of Birth: 10-11-1960 Age: 62 y.o. PCP: [name], MD  Vitals:   07/06/22 1430  Weight: 217 lb (98.4 kg)     [facility] Weekly seesion - 07/06/22 1700       [facility] "PREP" Location   [facility] "PREP" [facility]      Weekly Session   Topic Discussed --   fit testing and surveys   Minutes exercised this week 940 minutes    Classes attended to date 19             [facility] 07/06/2022, 5:28 PM

## 2022-07-09 NOTE — Progress Notes (Signed)
[facility] PREP Evaluation  Patient Details  Name: Grace [name] MRN: [id number] Date of Birth: 01-24-1961 Age: 62 y.o. PCP: [name], MD  Vitals:   07/08/22 1510  BP: 118/70  Pulse: 98  SpO2: 97%  Weight: 217 lb (98.4 kg)     [facility] Eval - 07/09/22 1000       [facility] "PREP" Location   [facility] "PREP" Location [facility]      Referral    Referring Provider [facility]    Program Start Date 04/06/22    Program End Date 07/06/22      Measurement   Waist Circumference End Program 51 inches    Hip Circumference End Program 48.5 inches    Body fat 45.1 percent      Information for Trainer   Goals Reset, has solid plans to cont to exercise      Mobility and Daily Activities   I find it easy to walk up or down two or more flights of stairs. 1    I have no trouble taking out the trash. 4    I do housework such as vacuuming and dusting on my own without difficulty. 2    I can easily lift a gallon of milk (8lbs). 4    I can easily walk a mile. 3    I have no trouble reaching into high cupboards or reaching down to pick up something from the floor. 4    I do not have trouble doing out-door work such as Armed forces logistics/support/administrative officer, raking leaves, or gardening. 3      Mobility and Daily Activities   I feel younger than my age. 3    I feel independent. 3    I feel energetic. 2    I live an active life.  2    I feel strong. 3    I feel healthy. 2    I feel active as other people my age. 3      How fit and strong are you.   Fit and Strong Total Score 39            Past Medical History:  Diagnosis Date   Allergy    Anxiety    Avascular necrosis (HCC)    Back pain    Cancer (HCC)    Chest pain    Chewing difficulty    Chronic fatigue syndrome    Colon polyp    pre-cancerous   Complication of anesthesia    patient states she requires more anesthesia   Depression    Disorder of soft tissue    Diverticulosis    Dizziness    Edema, lower extremity    Fatty liver     Fibromyalgia    GERD (gastroesophageal reflux disease)    H/O breast biopsy    pre- cancerous cells   Hand fracture, left    9/87   Heart palpitations    Heart valve disease    History of Holter monitoring    Hyperlipidemia    Insomnia    Joint pain    Lactose intolerance    Lhermitte's syndrome    Migraines    Multiple food allergies    Multiple sclerosis ([place])    dx in 11/1996   Multiple sclerosis (HCC)    Neuromuscular disorder (HCC)    Neuropathy    Numbness and tingling in hands    Obesity    Optic neuritis    OSA (obstructive sleep apnea)  no longer have OSA since stopped taking a MS medication   Osteoarthritis    Otitis media    Palpitations    Prediabetes    Raynaud's phenomenon    Shortness of breath    Sleep apnea    Due to medication that she no longer takes.   Swallowing difficulty    Vaginitis and vulvovaginitis    Vitamin B12 deficiency    Vitamin D deficiency    Past Surgical History:  Procedure Laterality Date   ABDOMINAL HYSTERECTOMY Bilateral 02/12/2016   Procedure: HYSTERECTOMY ABDOMINAL WITH BILATERAL SALPINGO OOPHERECTOMY;  Surgeon: Dian Queen, MD;  Location: [facility];  Service: Gynecology;  Laterality: Bilateral;   BREAST EXCISIONAL BIOPSY     core/left breast   CATARACT EXTRACTION, BILATERAL  06/2019   CHOLECYSTECTOMY  1983   COLONOSCOPY     1997/2001   DILATION AND CURETTAGE OF UTERUS     ENDOMETRIAL ABLATION  2012   EXTRACORPOREAL SHOCK WAVE LITHOTRIPSY Left 05/24/2022   Procedure: LEFT EXTRACORPOREAL SHOCK WAVE LITHOTRIPSY (ESWL);  Surgeon: Ardis Hughs, MD;  Location: [facility];  Service: Urology;  Laterality: Left;   LAPAROSCOPIC ABDOMINAL EXPLORATION     ovaries and intestines bond together   LEFT HEART CATHETERIZATION WITH CORONARY ANGIOGRAM N/A 06/21/2011   Procedure: LEFT HEART CATHETERIZATION WITH CORONARY ANGIOGRAM;  Surgeon: Leonie Man, MD;  Location: [facility];  Service: Cardiovascular;   Laterality: N/A;   LUMBAR PUNCTURE     11/01/1996   SALPINGOOPHORECTOMY Bilateral 02/12/2016   Procedure: BILATERAL SALPINGO OOPHORECTOMY;  Surgeon: Dian Queen, MD;  Location: [facility];  Service: Gynecology;  Laterality: Bilateral;   TOTAL HIP ARTHROPLASTY     left hip  6/02  /right hip12/04   Social History   Tobacco Use  Smoking Status Former   Packs/day: 0.25   Years: 8.00   Total pack years: 2.00   Types: Cigarettes   Quit date: 07/01/1995   Years since quitting: 27.0  Smokeless Tobacco Never   Attended 18+ workouts, 9 educational sessions Fit testing: Cardio march: 218 to 241 Sit to stand: 12 to 15 Bicep curl: 18 to 23 Balance improved. Encouraged to continue to build strength and working on balance.  Has plans to continue to exercise. Encouraged to build in rest days. Reminded to fuel with exercise, this will facilitate weight loss.   Barnett Hatter 07/09/2022, 10:51 AM

## 2022-07-10 ENCOUNTER — Other Ambulatory Visit: Payer: Self-pay | Admitting: Neurology

## 2022-07-12 NOTE — Telephone Encounter (Signed)
Follow up visit scheduled on 07/13/22.

## 2022-07-13 ENCOUNTER — Encounter: Payer: Self-pay | Admitting: Neurology

## 2022-07-13 ENCOUNTER — Ambulatory Visit (INDEPENDENT_AMBULATORY_CARE_PROVIDER_SITE_OTHER): Payer: Medicare Other | Admitting: Neurology

## 2022-07-13 VITALS — BP 139/81 | HR 84 | Ht 66.0 in | Wt 216.0 lb

## 2022-07-13 DIAGNOSIS — Z79891 Long term (current) use of opiate analgesic: Secondary | ICD-10-CM

## 2022-07-13 DIAGNOSIS — G35 Multiple sclerosis: Secondary | ICD-10-CM

## 2022-07-13 MED ORDER — GABAPENTIN 300 MG PO CAPS: 300.0000 mg | ORAL_CAPSULE | Freq: Two times a day (BID) | ORAL | 3 refills | Status: AC

## 2022-07-13 MED ORDER — TIZANIDINE HCL 2 MG PO TABS: ORAL_TABLET | ORAL | 1 refills | Status: DC

## 2022-07-13 MED ORDER — BACLOFEN 10 MG PO TABS: ORAL_TABLET | ORAL | 3 refills | Status: DC

## 2022-07-13 NOTE — Progress Notes (Signed)
[facility]  PATIENT: Grace [name] DOB: 1961/02/12  REFERRING DOCTOR OR PCP:  [name] SOURCE: patient  _________________________________   HISTORICAL  CHIEF COMPLAINT:  Chief Complaint  Patient presents with   Follow-up    Rm 2 alone    Multiple Sclerosis    Reports leg weakness has increase since her last visit. One fall has taken place since last visit on 11/21/2020. Off DMT's at the moment.  Reports her taste has improved since her last visit.    HISTORY OF PRESENT ILLNESS:  Grace [name] is a 62 y.o. woman with relapsing remitting multiple sclerosis.     Update 07/13/2022: She is off a  DMT for MS.  She is neurologically stable.   She reports a recent kidney stone and needed lithotripsy.   She is waling ok.  She needs to use th bannister going downstairs.   She has tingling in her arms and feet..   She feels her hand grip is reduced.   She has numbness in both feet.   NCV/EMG had not shown neuropathy.     She has some jerks and sspasms and balcofen/tizandine combo helps more than either by themselves -- takes bid mostly.    She has urinary urgency.  She had an allergic reaction to Flomax and stopped.  No recent UTI  She continues to have vision problems since cataract surgery and complications with elevated IOP.    She is sleeping poorly most nights - often waking up after a couple hours sleep and not falling back asleep.   She does take hydrocodone, baclofen, doxepin, melatonin, benadryl at night.  Temazepma helped her fall asleep but not stay asleep.     Ambien had helped her some in the past but she is concerned about side effects (one of her friends had sleep-driving and wrecked).    She has pain in the neck, back.  She gets some benefit from gabapentin and hydrocodone.   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 She had optic neuritis in 1999. In 2002, she had right-sided numbness.  MRIs of the brain show just a couple of nonspecific foci. A cervical spine MRI showed a focus consistent with demyelination at C5. Additionally, there are 2 smaller foci at T2 and T3-T4.  CSF was also abnormal c/w MS.     She was initially placed on Betaseron and then on Copaxone. She was unable to tolerate either of these and has not been on any DMT medication since 2003.   She retried Betaseron but stopped after diagnosis of avascular necrosis requiring hip replacement.  She was on Copaxone for a while.   Of note, she had had only one course of IV steroids prior to that and has had some afterwards.   She had not wanted to go on Tysabri or Gilenya and opted to go on no disease modifying therapy since 2012. At her last visit on 01/17/2014, she decided to go on Aubagio but due to elevated liver function test stopped..     Most recent MRIs are from earlier this year and showed no changes.  She has been off of all disease modifying therapy since 2018.  IMAGING 02/05/2018: Brain MRI showed "Multiple T2/FLAIR hyperintense foci in the hemispheres.  This is a nonspecific finding and could be due to chronic microvascular ischemic changes or 2 demyelination.  None of the foci appears to be acute.  When  compared to the MRI dated 07/03/2016, there is no interval change.  There is a normal enhancement pattern.  MRI cervical spine 07/27/2020  three T2 hyperintense foci within the spinal cord, adjacent to C5, C6-C7 and T2.  None of the foci enhance.  The foci adjacent to C5 and T2 were present on the previous MRI.  The focus adjacent to C6-C7 is not seen on the previous MRI from 2016 and is consistent with a more recent demyelinating plaque associated with her MS. 2.   Degenerative changes at C4-C5 through C6-C7 as detailed above.  There is mild spinal stenosis at C5-C6 and borderline spinal stenosis at C6-C7 but there does not appear to be any nerve root compression.  Degenerative changes at C4-C5 and C6-C7 have slightly  progressed compared to the previous MRI from 2016.  MRI Brain 05/17/2021 unchanged compared o 02/03/2018     REVIEW OF SYSTEMS: Constitutional: No fevers, chills, sweats, or change in appetite.  Has fatigue Eyes: No visual changes, double vision, eye pain Ear, nose and throat: No hearing loss, ear pain, nasal congestion, sore throat Cardiovascular: No chest pain, palpitations Respiratory:  No shortness of breath at rest or with exertion.   No wheezes GastrointestinaI: No nausea, vomiting, diarrhea, abdominal pain, fecal incontinence Genitourinary:  No dysuria, urinary retention or frequency.  No nocturia. Musculoskeletal:  She reports back pain and pain and some of her joints, especially her knees and hips. Integumentary: No rash, pruritus, skin lesions Neurological: as above.  She reports restless leg, insomnia and excessive daytime sleepiness. Psychiatric: Has had depression and anxiety.  Endocrine: No palpitations, diaphoresis, change in appetite, change in weigh.  He notes heat intolerance and excessive thirst at times. Hematologic/Lymphatic:  No anemia, purpura, petechiae. Allergic/Immunologic: No itchy/runny eyes, nasal congestion, recent allergic reactions, rashes  ALLERGIES: Allergies  Allergen Reactions   Diclofenac Anaphylaxis   Lamictal [Lamotrigine] Shortness Of Breath, Itching and Swelling    Tongue swells   Mobic [Meloxicam] Anaphylaxis   Ultram [Tramadol Hcl] Anaphylaxis   Nefazodone Itching and Swelling    Other reaction(s): Arthralgia (Joint Pain); Throat swelling, mouth sores   Betaseron [Interferon Beta-1b] Other (See Comments)    Caused avascular necrosis   Codeine Itching   Copaxone [Glatiramer Acetate] Other (See Comments)    Altered mental status: mental changes, anxiety, motion sickness   Gabapentin Itching    Itching if she takes more than '300mg'$    Latex     Latex band-aids cause redness and tears your skin   Nitrofurantoin     Causing itching all over,  tongue swelling, chest heaviness, swollen lips, cough, rapid HR, swollen eyelids, red eyes/gums, lips red/white per pt.   Oxcarbazepine Itching and Other (See Comments)    Dizziness, throat hurts   Teriflunomide Diarrhea    Hair loss, joint pains, elevated liver enzymes abugio (joint pains and liver issues)   Trazodone And Nefazodone     Mouth sores, tongue swelling   Tape Rash    HOME MEDICATIONS:  Current Outpatient Medications:    Ascorbic Acid (VITAMIN C) 1000 MG tablet, Take 1,000 mg by mouth daily., Disp: , Rfl:    aspirin EC 81 MG tablet, Take 81 mg by mouth daily., Disp: , Rfl:    baclofen (LIORESAL) 10 MG tablet, Take 2 tablets (20 mg total) by mouth 3 (three) times daily., Disp: 180 tablet, Rfl: 11   BLACK ELDERBERRY PO, Take 300 mg by mouth in the morning and at bedtime., Disp: , Rfl:  Cholecalciferol (VITAMIN D) 50 MCG (2000 UT) CAPS, Take 2,000 Units by mouth daily. , Disp: , Rfl:    CINNAMON PO, Take 1,200 mg by mouth daily. , Disp: , Rfl:    diphenhydrAMINE (BENADRYL) 25 MG tablet, Take 25 mg by mouth 3 (three) times daily. Takes with Hydrocodone, Disp: , Rfl:    doxepin (SINEQUAN) 10 MG capsule, TAKE 1 TO 2 CAPSULES AT BEDTIME, Disp: 180 capsule, Rfl: 2   EPINEPHrine 0.3 mg/0.3 mL IJ SOAJ injection, Use as directed for life-threatening allergic reaction., Disp: 1 each, Rfl: 3   etodolac (LODINE) 500 MG tablet, Take 500 mg by mouth. 1-2 times a day, Disp: , Rfl:    famotidine (PEPCID) 20 MG tablet, TAKE 1 TABLET BY MOUTH TWICE A DAY, Disp: 180 tablet, Rfl: 1   Fexofenadine-Pseudoephedrine (ALLEGRA-D PO), Take by mouth in the morning., Disp: , Rfl:    Flaxseed, Linseed, (FLAX SEED OIL) 1000 MG CAPS, Take 1,200 mg by mouth daily. , Disp: , Rfl:    gabapentin (NEURONTIN) 300 MG capsule, Take 1 capsule (300 mg total) by mouth 3 (three) times daily., Disp: 90 capsule, Rfl: 11   ipratropium (ATROVENT) 0.06 % nasal spray, Use 2 sprays each nostril bid prn drainage, Disp: 30 mL,  Rfl: 5   L-Lysine 1000 MG TABS, Take by mouth. As needed, Disp: , Rfl:    Lactobacillus (PROBIOTIC ACIDOPHILUS PO), Take by mouth daily. Digestive Advantage BC   bacillus coagulans, Disp: , Rfl:    levocetirizine (XYZAL) 5 MG tablet, TAKE 1 TABLET BY MOUTH EVERY DAY IN THE EVENING, Disp: 90 tablet, Rfl: 2   LORazepam (ATIVAN) 1 MG tablet, Take 1 tablet (1 mg total) by mouth at bedtime., Disp: 30 tablet, Rfl: 5   Lutein-Bilberry (BILBERRY PLUS LUTEIN PO), Take by mouth daily., Disp: , Rfl:    Melatonin 10 MG TABS, Take 1 tablet by mouth at bedtime. , Disp: , Rfl:    methylphenidate (CONCERTA) 27 MG PO CR tablet, Take 1 tablet (27 mg total) by mouth every morning., Disp: 30 tablet, Rfl: 0   Moringa 500 MG CAPS, Take 2 capsules by mouth daily., Disp: , Rfl:    Multiple Vitamins-Minerals (HAIR/SKIN/NAILS/BIOTIN) TABS, Take by mouth daily. , Disp: , Rfl:    Nutritional Supplements (NUTRITIONAL SUPPLEMENT PO), Take 2.1 g by mouth in the morning and at bedtime. LION'S MANE, Disp: , Rfl:    nystatin-triamcinolone ointment (MYCOLOG), Apply 1 application topically 2 (two) times daily., Disp: 30 g, Rfl: 2   Oxcarbazepine (TRILEPTAL) 300 MG tablet, Take 300 mg by mouth 3 (three) times daily., Disp: , Rfl:    rizatriptan (MAXALT-MLT) 10 MG disintegrating tablet, Take 1 tablet (10 mg total) by mouth as needed for migraine. May repeat in 2 hours if needed.  Max of 2 tablets in 24 hours, and 10 tablets per month, Disp: 10 tablet, Rfl: 11   temazepam (RESTORIL) 15 MG capsule, Take 1 capsule (15 mg total) by mouth at bedtime as needed for sleep., Disp: 30 capsule, Rfl: 2   tiZANidine (ZANAFLEX) 2 MG tablet, TAKE UP TO 4 PILLS A DAY, Disp: 120 tablet, Rfl: 1   Vitamin D, Ergocalciferol, (DRISDOL) 1.25 MG (50000 UNIT) CAPS capsule, ONE CAPSULE TWICE WEEKLY ON TUESDAYS/FRIDAYS, Disp: 24 capsule, Rfl: 2   vitamin E 1000 UNIT capsule, Take 1,000 Units by mouth daily. , Disp: , Rfl:    vitamin E 1000 UNIT capsule, vitamin  E 670 mg (1,000 unit) capsule   1 capsule every  day by oral route., Disp: , Rfl:    zolpidem (AMBIEN) 10 MG tablet, Take 10 mg by mouth at bedtime as needed. For sleep, Disp: , Rfl:    HYDROcodone-acetaminophen (NORCO/VICODIN) 5-325 MG tablet, Take 1 tablet by mouth 3 (three) times daily as needed for moderate pain., Disp: 90 tablet, Rfl: 0   timolol (BETIMOL) 0.5 % ophthalmic solution, Place 1 drop into the right eye 2 (two) times daily. (Patient not taking: Reported on 12/17/2020), Disp: , Rfl:    timolol (TIMOPTIC) 0.5 % ophthalmic solution, Place 1 drop into the right eye daily. (Patient not taking: Reported on 12/17/2020), Disp: , Rfl:   PAST MEDICAL HISTORY: Past Medical History:  Diagnosis Date   Allergy    Anxiety    Avascular necrosis ([place])    Back pain    Cancer (HCC)    Chest pain    Chewing difficulty    Chronic fatigue syndrome    Colon polyp    pre-cancerous   Complication of anesthesia    patient states she requires more anesthesia   Depression    Disorder of soft tissue    Diverticulosis    Dizziness    Edema, lower extremity    Fatty liver    Fibromyalgia    GERD (gastroesophageal reflux disease)    H/O breast biopsy    pre- cancerous cells   Hand fracture, left    9/87   Heart palpitations    Heart valve disease    History of Holter monitoring    Hyperlipidemia    Insomnia    Joint pain    Lactose intolerance    Lhermitte's syndrome    Migraines    Multiple food allergies    Multiple sclerosis (HCC)    dx in 11/1996   Multiple sclerosis (HCC)    Neuromuscular disorder (HCC)    Neuropathy    Numbness and tingling in hands    Obesity    Optic neuritis    OSA (obstructive sleep apnea)    no longer have OSA since stopped taking a MS medication   Osteoarthritis    Otitis media    Palpitations    Prediabetes    Raynaud's phenomenon    Shortness of breath    Sleep apnea    Due to medication that she no longer takes.   Swallowing difficulty     Vaginitis and vulvovaginitis    Vitamin B12 deficiency    Vitamin D deficiency     PAST SURGICAL HISTORY: Past Surgical History:  Procedure Laterality Date   ABDOMINAL HYSTERECTOMY Bilateral 02/12/2016   Procedure: HYSTERECTOMY ABDOMINAL WITH BILATERAL SALPINGO OOPHERECTOMY;  Surgeon: Dian Queen, MD;  Location: [facility];  Service: Gynecology;  Laterality: Bilateral;   BREAST EXCISIONAL BIOPSY     core/left breast   CATARACT EXTRACTION, BILATERAL  06/2019   CHOLECYSTECTOMY  1983   COLONOSCOPY     1997/2001   DILATION AND CURETTAGE OF UTERUS     ENDOMETRIAL ABLATION  2012   LAPAROSCOPIC ABDOMINAL EXPLORATION     ovaries and intestines bond together   LEFT HEART CATHETERIZATION WITH CORONARY ANGIOGRAM N/A 06/21/2011   Procedure: LEFT HEART CATHETERIZATION WITH CORONARY ANGIOGRAM;  Surgeon: Leonie Man, MD;  Location: [facility] C[facility] LAB;  Service: Cardiovascular;  Laterality: N/A;   LUMBAR PUNCTURE     11/01/1996   SALPINGOOPHORECTOMY Bilateral 02/12/2016   Procedure: BILATERAL SALPINGO OOPHORECTOMY;  Surgeon: Dian Queen, MD;  Location: [facility];  Service: Gynecology;  Laterality: Bilateral;  TOTAL HIP ARTHROPLASTY     left hip  6/02  /right hip12/04    FAMILY HISTORY: Family History  Problem Relation Age of Onset   Pancreatic cancer Mother    Stroke Mother    Hypertension Mother    Hyperlipidemia Mother    Heart disease Mother    Cancer Mother    Depression Mother    Anxiety disorder Mother    Alcoholism Mother    Testicular cancer Brother    Colon cancer Maternal Uncle    Pancreatic cancer Paternal Uncle    Prostate cancer Maternal Grandfather    Colon polyps Father    Diabetes Father    Hypertension Father    Hyperlipidemia Father    Kidney disease Father    Sleep apnea Father    Obesity Father     SOCIAL HISTORY:  Social History   Socioeconomic History   Marital status: Widowed    Spouse name: Not on file   Number of children: Not on file   Years of  education: Not on file   Highest education level: Not on file  Occupational History   Occupation: disabled  Tobacco Use   Smoking status: Former    Packs/day: 0.25    Years: 8.00    Pack years: 2.00    Types: Cigarettes    Quit date: 07/01/1995    Years since quitting: 25.4   Smokeless tobacco: Never  Vaping Use   Vaping Use: Never used  Substance and Sexual Activity   Alcohol use: No    Alcohol/week: 0.0 standard drinks   Drug use: Yes    Types: Hydrocodone   Sexual activity: Not Currently  Other Topics Concern   Not on file  Social History Narrative   Not on file   Social Determinants of Health   Financial Resource Strain: Low Risk    Difficulty of Paying Living Expenses: Not hard at all  Food Insecurity: No Food Insecurity   Worried About Charity fundraiser in the Last Year: Never true   [place] in the Last Year: Never true  Transportation Needs: No Transportation Needs   Lack of Transportation (Medical): No   Lack of Transportation (Non-Medical): No  Physical Activity: Inactive   Days of Exercise per Week: 0 days   Minutes of Exercise per Session: 0 min  Stress: No Stress Concern Present   Feeling of Stress : Not at all  Social Connections: Not on file  Intimate Partner Violence: Not on file     PHYSICAL EXAM  Vitals:   12/17/20 1556  BP: 134/85  Pulse: (!) 102  Weight: 213 lb 5 oz (96.8 kg)  Height: '5\' 6"'$  (1.676 m)    Body mass index is 34.43 kg/m.   General: The patient is well-developed and well-nourished and in no acute distress  Neurologic Exam  Mental status: The patient is alert and oriented x 3 at the time of the examination. The patient has apparent normal recent and remote memory, with an apparently normal attention span and concentration ability.   Speech is normal.  Cranial nerves: Extraocular movements are full.  She has altered color vision on the right.  Facial strength and sensation was normal.. No obvious hearing deficits  are noted.  Motor:  Muscle bulk is normal.   She has increased muscle tone in the legs, left greater than right.  Strength is 5/5 except for the left 4+/5 left EHL  Sensory: Sensory testing shows reduced touch sensation  on the right side and reduced vibration on the right..   Coordination: Cerebellar testing reveals good finger-nose-finger and mildly reduced bilateral heel-to-shin  Gait and station: Station is normal.  The gait is mildly wide and also arthritic.  Tandem gait is poor.  Romberg is negative.   Reflexes: Deep tendon reflexes are symmetric and normal bilaterally.         ASSESSMENT AND PLAN  Multiple sclerosis ([place]) - Plan: Drug Screen, Ur (12+Oxycodone+Crt)  Chronic prescription opiate use - Plan: Drug Screen, Ur (12+Oxycodone+Crt)  Numbness  Ataxic gait  Depression with anxiety  Other fatigue   1.   She will continue off a DMT for now.  Due to s.e. she prefers not to restart even though the last MRI of the cervical spine 2022 did show 1 lesion not present on a previous MRI from 2016 (plus two older lesions).  We will recheck MRI later in year 2    Renew baclofen, tizanidine and gabapentin 3.   Continue Hydrocodone 5 mg up to 3 times a day for pain.  The PDMP was reviewed and she is not getting opiates from other doctors.  She does not show any drug-seeking behavior.  We will check a urine drug screen today. 4.   She will return to see me in 4 months or sooner if she has new or worsening neurologic symptoms.    Grace Bellmore A. Felecia Shelling, MD, PhD [id number], [id number] PM Certified in Neurology, Clinical Neurophysiology, Sleep Medicine, Pain Medicine and Neuroimaging  [facility] [address], [place], [place] [address] [phone]

## 2022-07-15 ENCOUNTER — Other Ambulatory Visit: Payer: Self-pay | Admitting: Orthopedic Surgery

## 2022-07-15 DIAGNOSIS — Z1231 Encounter for screening mammogram for malignant neoplasm of breast: Secondary | ICD-10-CM

## 2022-07-15 LAB — DRUG SCREEN, URINE
Amphetamines, Urine: NEGATIVE ng/mL
Barbiturate screen, urine: NEGATIVE ng/mL
Benzodiazepine Quant, Ur: NEGATIVE ng/mL
Cannabinoid Quant, Ur: NEGATIVE ng/mL
Cocaine (Metab.): NEGATIVE ng/mL
Opiate Quant, Ur: POSITIVE ng/mL — AB
PCP Quant, Ur: NEGATIVE ng/mL

## 2022-07-21 ENCOUNTER — Other Ambulatory Visit (INDEPENDENT_AMBULATORY_CARE_PROVIDER_SITE_OTHER): Payer: Medicare Other

## 2022-07-21 DIAGNOSIS — R7989 Other specified abnormal findings of blood chemistry: Secondary | ICD-10-CM

## 2022-07-21 LAB — HEPATIC FUNCTION PANEL
ALT: 79 U/L — ABNORMAL HIGH (ref 0–35)
AST: 71 U/L — ABNORMAL HIGH (ref 0–37)
Albumin: 4.3 g/dL (ref 3.5–5.2)
Alkaline Phosphatase: 106 U/L (ref 39–117)
Bilirubin, Direct: 0.1 mg/dL (ref 0.0–0.3)
Total Bilirubin: 0.6 mg/dL (ref 0.2–1.2)
Total Protein: 7.4 g/dL (ref 6.0–8.3)

## 2022-07-22 LAB — HM PAP SMEAR

## 2022-07-26 ENCOUNTER — Other Ambulatory Visit: Payer: Self-pay

## 2022-07-26 DIAGNOSIS — R7989 Other specified abnormal findings of blood chemistry: Secondary | ICD-10-CM

## 2022-08-23 ENCOUNTER — Other Ambulatory Visit (INDEPENDENT_AMBULATORY_CARE_PROVIDER_SITE_OTHER): Payer: Medicare Other

## 2022-08-23 DIAGNOSIS — R7989 Other specified abnormal findings of blood chemistry: Secondary | ICD-10-CM | POA: Diagnosis not present

## 2022-08-23 LAB — HEPATIC FUNCTION PANEL
ALT: 78 U/L — ABNORMAL HIGH (ref 0–35)
AST: 87 U/L — ABNORMAL HIGH (ref 0–37)
Albumin: 4.3 g/dL (ref 3.5–5.2)
Alkaline Phosphatase: 126 U/L — ABNORMAL HIGH (ref 39–117)
Bilirubin, Direct: 0.1 mg/dL (ref 0.0–0.3)
Total Bilirubin: 0.4 mg/dL (ref 0.2–1.2)
Total Protein: 7.3 g/dL (ref 6.0–8.3)

## 2022-08-26 ENCOUNTER — Ambulatory Visit (INDEPENDENT_AMBULATORY_CARE_PROVIDER_SITE_OTHER): Payer: Medicare Other | Admitting: Gastroenterology

## 2022-08-26 ENCOUNTER — Encounter: Payer: Self-pay | Admitting: Gastroenterology

## 2022-08-26 ENCOUNTER — Other Ambulatory Visit: Payer: Self-pay | Admitting: Neurology

## 2022-08-26 VITALS — BP 102/72 | HR 57 | Ht 66.0 in | Wt 219.0 lb

## 2022-08-26 DIAGNOSIS — K760 Fatty (change of) liver, not elsewhere classified: Secondary | ICD-10-CM

## 2022-08-26 DIAGNOSIS — K9089 Other intestinal malabsorption: Secondary | ICD-10-CM

## 2022-08-26 MED ORDER — COLESTIPOL HCL 1 G PO TABS: 1.0000 g | ORAL_TABLET | Freq: Two times a day (BID) | ORAL | 11 refills | Status: DC

## 2022-08-26 MED ORDER — DICYCLOMINE HCL 10 MG PO CAPS: 10.0000 mg | ORAL_CAPSULE | Freq: Three times a day (TID) | ORAL | 11 refills | Status: DC

## 2022-08-26 NOTE — Progress Notes (Signed)
    Assessment     MAFLD Bile salt diarrhea vs IBS-D S/P cholecystectomy with CBD dilation BMI=35.35   Recommendations    Continue carb modified, fat modified, weight loss diet supervised by her PCP. [facility] referral. Potential candidate for Rezdiffra.  Start dicyclomine 10 mg tid ac and Colestipol 1 g po bid. Contact us if diarrhea, urgency do not significantly improved. REV in 1 year   HPI    This is a 62 year old female with mildly elevated LFTs.  Most recent LFTs below.  Severe, diffuse hepatic steatosis has been noted on cross-sectional imaging.  She relates longstanding problems with urgent postprandial diarrhea with occasional minimal lower abdominal cramping.  She states her symptoms began following her cholecystectomy. Denies weight loss, abdominal pain, constipation, change in stool caliber, melena, hematochezia, nausea, vomiting, dysphagia, reflux symptoms, chest pain.    Labs / Imaging       Latest Ref Rng & Units 08/23/2022   10:23 AM 07/21/2022    4:50 PM 05/20/2022    4:12 PM  Hepatic Function  Total Protein 6.0 - 8.3 g/dL 7.3  7.4  8.2   Albumin 3.5 - 5.2 g/dL 4.3  4.3  4.4   AST 0 - 37 U/L 87  71  22   ALT 0 - 35 U/L 78  79  27   Alk Phosphatase 39 - 117 U/L 126  106  82   Total Bilirubin 0.2 - 1.2 mg/dL 0.4  0.6  0.8   Bilirubin, Direct 0.0 - 0.3 mg/dL 0.1  0.1         Latest Ref Rng & Units 05/20/2022    4:12 PM 04/29/2022    3:41 PM 07/20/2021    3:39 PM  CBC  WBC 4.0 - 10.5 K/uL 11.3  5.2  6.3   Hemoglobin 12.0 - 15.0 g/dL 16.1  09.6  04.5   Hematocrit 36.0 - 46.0 % 41.7  40.8  40.4   Platelets 150 - 400 K/uL 271  276  298    Current Medications, Allergies, Past Medical History, Past Surgical History, Family History and Social History were reviewed in [employer] record.   Physical Exam: General: Well developed, well nourished, no acute distress Head: Normocephalic and atraumatic Eyes: Sclerae  anicteric, EOMI Ears: Normal auditory acuity Mouth: No deformities or lesions noted Lungs: Clear throughout to auscultation Heart: Regular rate and rhythm; No murmurs, rubs or bruits Abdomen: Soft, non tender and non distended. No masses, hepatosplenomegaly or hernias noted. Normal Bowel sounds Rectal: Not done Musculoskeletal: Symmetrical with no gross deformities  Pulses:  Normal pulses noted Extremities: No edema or deformities noted Neurological: Alert oriented x 4, grossly nonfocal Psychological:  Alert and cooperative. Normal mood and affect   [place] Jaquith T. Russella Dar, MD 08/26/2022, 9:53 AM

## 2022-08-26 NOTE — Patient Instructions (Signed)
We have sent the following medications to your pharmacy for you to pick up at your convenience: colestipol and dicyclomine.   [facility]. They will contact you with an appointment date and time.   The [facility] providers would like to encourage you to use [facility] to communicate with providers for non-urgent requests or questions.  Due to long hold times on the telephone, sending your provider a message by [facility] may be a faster and more efficient way to get a response.  Please allow 48 business hours for a response.  Please remember that this is for non-urgent requests.   Thank you for choosing me and [facility].  Grace [name]. Pleas Koch., MD., Clementeen Graham

## 2022-08-27 ENCOUNTER — Telehealth: Payer: Self-pay

## 2022-08-27 NOTE — Telephone Encounter (Signed)
Received fax from [facility] stating patient has an appt from referral on 12/22/22 at 1:00pm. Patient is aware of appt.

## 2022-08-30 ENCOUNTER — Ambulatory Visit
Admission: RE | Admit: 2022-08-30 | Discharge: 2022-08-30 | Disposition: A | Discharge status: Home / Self Care | Payer: Medicare Other | Source: Ambulatory Visit | Attending: Orthopedic Surgery | Admitting: Orthopedic Surgery

## 2022-08-30 DIAGNOSIS — Z1231 Encounter for screening mammogram for malignant neoplasm of breast: Secondary | ICD-10-CM

## 2022-08-30 MED ORDER — HYDROCODONE-ACETAMINOPHEN 5-325 MG PO TABS: 1.0000 | ORAL_TABLET | Freq: Three times a day (TID) | ORAL | 0 refills | Status: DC | PRN

## 2022-08-30 NOTE — Telephone Encounter (Signed)
Last seen on 07/13/22  per note "Continue Hydrocodone 5 mg up to 3 times a day for pain. " Follow up scheduled on 11/23/22 Last filled on 04/22/22 #270 tablets (90 day supply) Rx pending to be signed

## 2022-10-11 LAB — HM DEXA SCAN

## 2022-10-15 ENCOUNTER — Other Ambulatory Visit: Payer: Self-pay | Admitting: Neurology

## 2022-10-18 NOTE — Telephone Encounter (Signed)
Pt last seen on 07/13/22 per note "She does take hydrocodone, baclofen, doxepin, melatonin, benadryl at night.  Follow up scheduled on 11/23/22 Last filled on 07/12/22 #180 tablets (90 day supply)

## 2022-10-19 ENCOUNTER — Ambulatory Visit (INDEPENDENT_AMBULATORY_CARE_PROVIDER_SITE_OTHER): Payer: Medicare Other | Admitting: Internal Medicine

## 2022-10-19 ENCOUNTER — Encounter: Payer: Self-pay | Admitting: Internal Medicine

## 2022-10-19 VITALS — BP 120/72 | HR 72 | Temp 98.0°F | Ht 66.0 in | Wt 219.0 lb

## 2022-10-19 DIAGNOSIS — R7303 Prediabetes: Secondary | ICD-10-CM

## 2022-10-19 DIAGNOSIS — K760 Fatty (change of) liver, not elsewhere classified: Secondary | ICD-10-CM

## 2022-10-19 DIAGNOSIS — F331 Major depressive disorder, recurrent, moderate: Secondary | ICD-10-CM

## 2022-10-19 DIAGNOSIS — Z6835 Body mass index (BMI) 35.0-35.9, adult: Secondary | ICD-10-CM

## 2022-10-19 DIAGNOSIS — E782 Mixed hyperlipidemia: Secondary | ICD-10-CM | POA: Diagnosis not present

## 2022-10-19 LAB — LIPID PANEL
Chol/HDL Ratio: 2.7 ratio (ref 0.0–4.4)
Cholesterol, Total: 154 mg/dL (ref 100–199)
HDL: 57 mg/dL (ref >39)
LDL Chol Calc (NIH): 71 mg/dL (ref 0–99)
Triglycerides: 154 mg/dL — ABNORMAL HIGH (ref 0–149)
VLDL Cholesterol Cal: 26 mg/dL (ref 5–40)

## 2022-10-19 LAB — BMP8+EGFR
BUN/Creatinine Ratio: 15 (ref 9–23)
BUN: 10 mg/dL (ref 6–24)
CO2: 21 mmol/L (ref 20–29)
Calcium: 9.3 mg/dL (ref 8.7–10.2)
Chloride: 105 mmol/L (ref 96–106)
Creatinine, Ser: 0.67 mg/dL (ref 0.57–1.00)
Glucose: 113 mg/dL — ABNORMAL HIGH (ref 70–99)
Potassium: 4.5 mmol/L (ref 3.5–5.2)
Sodium: 139 mmol/L (ref 134–144)
eGFR: 101 mL/min/{1.73_m2} (ref >59)

## 2022-10-19 LAB — HEMOGLOBIN A1C
Est. average glucose Bld gHb Est-mCnc: 128 mg/dL
Hgb A1c MFr Bld: 6.1 % — ABNORMAL HIGH (ref 4.8–5.6)

## 2022-10-19 NOTE — Progress Notes (Signed)
Subjective:  Patient ID: Grace [name] , female    DOB: 1961/01/21 , 62 y.o.   MRN: [id number]  Chief Complaint  Patient presents with   Hyperlipidemia   Prediabetes   vitamin d    HPI  Patient presents today for prediabetes & cholesterol follow up. She reports compliance with medications.   Letter was sent to Dr.Grewal to request pap smear results.     Past Medical History:  Diagnosis Date   Allergy    Anxiety    Avascular necrosis (HCC)    Back pain    Cancer (HCC)    Cataract    Had surgery   Chest pain    Chewing difficulty    Chronic fatigue syndrome    Colon polyp    pre-cancerous   Complication of anesthesia    patient states she requires more anesthesia   Depression    Disorder of soft tissue    Diverticulosis    Dizziness    Edema, lower extremity    Fatty liver    Fibromyalgia    GERD (gastroesophageal reflux disease)    Glaucoma 07-2019   From surgery   H/O breast biopsy    pre- cancerous cells   Hand fracture, left    9/87   Heart palpitations    Heart valve disease    History of Holter monitoring    Hyperlipidemia    Insomnia    Joint pain    Lactose intolerance    Lhermitte's syndrome    Migraines    Multiple food allergies    Multiple sclerosis (HCC)    dx in 11/1996   Multiple sclerosis (HCC)    Neuromuscular disorder (HCC)    Neuropathy    Numbness and tingling in hands    Obesity    Optic neuritis    OSA (obstructive sleep apnea)    no longer have OSA since stopped taking a MS medication   Osteoarthritis    Otitis media    Palpitations    Prediabetes    Raynaud's phenomenon    Shortness of breath    Sleep apnea    Due to medication that she no longer takes.   Swallowing difficulty    Vaginitis and vulvovaginitis    Vitamin B12 deficiency    Vitamin D deficiency      Family History  Problem Relation Age of Onset   Pancreatic cancer Mother    Stroke Mother    Hypertension Mother    Hyperlipidemia Mother     Heart disease Mother    Cancer Mother    Depression Mother    Anxiety disorder Mother    Alcoholism Mother    Alcohol abuse Mother    Arthritis Mother    Testicular cancer Brother    Cancer Brother    Colon cancer Maternal Uncle    Pancreatic cancer Paternal Uncle    Prostate cancer Maternal Grandfather    Colon polyps Father    Diabetes Father    Hypertension Father    Hyperlipidemia Father    Kidney disease Father    Sleep apnea Father    Obesity Father    Arthritis Father    Asthma Father    COPD Father    Heart disease Father    Asthma Paternal Grandfather    COPD Paternal Grandfather    Heart disease Paternal Grandfather    Cancer Paternal Grandmother    ADD / ADHD Son    Anxiety disorder Son  Hypertension Son    Cancer Maternal Uncle    Cancer Paternal Uncle    Hypertension Daughter      Current Outpatient Medications:    Ascorbic Acid (VITAMIN C) 1000 MG tablet, Take 1,000 mg by mouth daily. (Patient not taking: Reported on 10/28/2022), Disp: , Rfl:    aspirin EC 81 MG tablet, Take 81 mg by mouth daily., Disp: , Rfl:    AZO-CRANBERRY PO, Take 1 capsule by mouth daily at 12 noon. (Patient not taking: Reported on 10/28/2022), Disp: , Rfl:    baclofen (LIORESAL) 10 MG tablet, TAKE 1 TABLET BY MOUTH 3 TIMES DAILY., Disp: 270 tablet, Rfl: 3   BLACK ELDERBERRY PO, Take 300 mg by mouth in the morning and at bedtime. (Patient not taking: Reported on 10/28/2022), Disp: , Rfl:    Cholecalciferol (VITAMIN D) 50 MCG (2000 UT) CAPS, Take 2,000 Units by mouth daily.  (Patient not taking: Reported on 10/28/2022), Disp: , Rfl:    CINNAMON PO, Take 1,200 mg by mouth daily. (Patient not taking: Reported on 10/28/2022), Disp: , Rfl:    colestipol (COLESTID) 1 g tablet, Take 1 tablet (1 g total) by mouth 2 (two) times daily., Disp: 60 tablet, Rfl: 11   dicyclomine (BENTYL) 10 MG capsule, Take 1 capsule (10 mg total) by mouth 3 (three) times daily before meals., Disp: 90 capsule, Rfl:  11   diphenhydrAMINE (BENADRYL) 25 MG tablet, Take 25 mg by mouth 3 (three) times daily. Takes with Hydrocodone, Disp: , Rfl:    doxepin (SINEQUAN) 10 MG capsule, TAKE 1-2 CAPSULES (10-20 MG TOTAL) BY MOUTH AT BEDTIME., Disp: 180 capsule, Rfl: 0   gabapentin (NEURONTIN) 300 MG capsule, Take 1 capsule (300 mg total) by mouth 2 (two) times daily., Disp: 180 capsule, Rfl: 3   HYDROcodone-acetaminophen (NORCO/VICODIN) 5-325 MG tablet, Take 1 tablet by mouth 3 (three) times daily as needed for moderate pain., Disp: 270 tablet, Rfl: 0   L-Lysine 1000 MG TABS, Take by mouth. As needed (Patient not taking: Reported on 10/28/2022), Disp: , Rfl:    Lactobacillus (PROBIOTIC ACIDOPHILUS PO), Take by mouth daily. Digestive Advantage BC   bacillus coagulans (Patient not taking: Reported on 10/28/2022), Disp: , Rfl:    levocetirizine (XYZAL) 5 MG tablet, TAKE 1 TABLET BY MOUTH EVERY DAY IN THE EVENING, Disp: 90 tablet, Rfl: 2   Melatonin 10 MG TABS, Take 1 tablet by mouth at bedtime. , Disp: , Rfl:    ondansetron (ZOFRAN-ODT) 4 MG disintegrating tablet, Take 1 tablet (4 mg total) by mouth every 8 (eight) hours as needed for nausea or vomiting., Disp: 12 tablet, Rfl: 0   rizatriptan (MAXALT-MLT) 10 MG disintegrating tablet, Take 1 tablet (10 mg total) by mouth as needed for migraine. May repeat in 2 hours if needed.  Max of 2 tablets in 24 hours, and 10 tablets per month, Disp: 10 tablet, Rfl: 11   tiZANidine (ZANAFLEX) 2 MG tablet, TAKE UP TO 4 TABLETS BY MOUTH EVERY DAY, Disp: 360 tablet, Rfl: 1   Vitamin D, Ergocalciferol, (DRISDOL) 1.25 MG (50000 UNIT) CAPS capsule, ONE CAPSULE TWICE WEEKLY ON TUESDAYS/FRIDAYS, Disp: 24 capsule, Rfl: 2   vitamin E 1000 UNIT capsule, , Disp: , Rfl:    ipratropium (ATROVENT) 0.06 % nasal spray, Use 2 sprays each nostril bid prn drainage, Disp: 30 mL, Rfl: 5   Allergies  Allergen Reactions   Diclofenac Anaphylaxis   Lamictal [Lamotrigine] Shortness Of Breath, Itching and Swelling     Tongue swells  Mobic [Meloxicam] Anaphylaxis   Ultram [Tramadol Hcl] Anaphylaxis   Nefazodone Itching and Swelling    Other reaction(s): Arthralgia (Joint Pain); Throat swelling, mouth sores   Banana    Beef-Derived Products    Betaseron [Interferon Beta-1b] Other (See Comments)    Caused avascular necrosis   Codeine Itching   Copaxone [Glatiramer Acetate] Other (See Comments)    Altered mental status: mental changes, anxiety, motion sickness   Flomax [Tamsulosin Hcl] Itching   Gabapentin Itching    Itching if she takes more than 300mg    Latex     Latex band-aids cause redness and tears your skin   Nitrofurantoin     Causing itching all over, tongue swelling, chest heaviness, swollen lips, cough, rapid HR, swollen eyelids, red eyes/gums, lips red/white per pt.   Oxcarbazepine Itching and Other (See Comments)    Dizziness, throat hurts   Pork-Derived Products    Teriflunomide Diarrhea    Hair loss, joint pains, elevated liver enzymes abugio (joint pains and liver issues)   Trazodone And Nefazodone     Mouth sores, tongue swelling   Tape Rash     Review of Systems  Constitutional: Negative.   Eyes: Negative.   Respiratory: Negative.    Cardiovascular: Negative.   Musculoskeletal: Negative.   Skin: Negative.   Neurological: Negative.   Psychiatric/Behavioral: Negative.       Today's Vitals   10/19/22 0946  BP: 120/72  Pulse: 72  Temp: 98 F (36.7 C)  Weight: 219 lb (99.3 kg)  Height: 5\' 6"  (1.676 m)  PainSc: 5    Body mass index is 35.35 kg/m.  Wt Readings from Last 3 Encounters:  10/28/22 219 lb (99.3 kg)  10/19/22 219 lb (99.3 kg)  08/26/22 219 lb (99.3 kg)    The 10-year ASCVD risk score (Arnett DK, et al., 2019) is: 2.3%   Values used to calculate the score:     Age: 5 years     Sex: Female     Is Non-Hispanic African American: No     Diabetic: No     Tobacco smoker: No     Systolic Blood Pressure: 128 mmHg     Is BP treated: No     HDL  Cholesterol: 57 mg/dL     Total Cholesterol: 154 mg/dL  Objective:  Physical Exam Vitals and nursing note reviewed.  Constitutional:      Appearance: Normal appearance. She is obese.  HENT:     Head: Normocephalic and atraumatic.  Eyes:     Extraocular Movements: Extraocular movements intact.  Cardiovascular:     Rate and Rhythm: Normal rate and regular rhythm.     Heart sounds: Normal heart sounds.  Pulmonary:     Effort: Pulmonary effort is normal.     Breath sounds: Normal breath sounds.  Abdominal:     General: Bowel sounds are normal.     Palpations: Abdomen is soft.     Comments: Obese   Musculoskeletal:     Cervical back: Normal range of motion.  Skin:    General: Skin is warm.  Neurological:     General: No focal deficit present.     Mental Status: She is alert.  Psychiatric:        Mood and Affect: Mood normal.        Behavior: Behavior normal.       Assessment And Plan:  1. Prediabetes Comments: Previous labs reviewed, her a1c has been elevated in the past. I will  recheck an a1c today. Encouraged to avoid sugary beverages/diet drinks. - Hemoglobin A1c - BMP8+EGFR  2. Mixed hyperlipidemia Comments: She has been on atorvastatin in the past. Will assess ASCVD risk once labs are available. Will consider cardiac calcium scoring in the future. - Lipid panel  3. Moderate episode of recurrent major depressive disorder (HCC) Comments: Chronic, not currently on meds. She has been on sertraline in the past. She does not wish to start SSRI therapy at this time.  4. Hepatic steatosis Comments: Importance of dietary/exercise compliance was d/w patient in detail. Encouraged to increase intake of cruciferous veggies and artichoke hearts.  5. Class 2 severe obesity due to excess calories with serious comorbidity and body mass index (BMI) of 35.0 to 35.9 in adult [facility]) Comments: She is encouraged to strive for BMI<30 while aiming to exercise at least 150 minutes per week.     Return for keep next appt as scheduled .  Patient was given opportunity to ask questions. Patient verbalized understanding of the plan and was able to repeat key elements of the plan. All questions were answered to their satisfaction.    I, Gwynneth Aliment, MD, have reviewed all documentation for this visit. The documentation on 10/19/22 for the exam, diagnosis, procedures, and orders are all accurate and complete.   IF YOU HAVE BEEN REFERRED TO A SPECIALIST, IT MAY TAKE 1-2 WEEKS TO SCHEDULE/PROCESS THE REFERRAL. IF YOU HAVE NOT HEARD FROM US/SPECIALIST IN TWO WEEKS, PLEASE GIVE [place] A CALL AT [phone].

## 2022-10-19 NOTE — Patient Instructions (Signed)

## 2022-10-25 ENCOUNTER — Other Ambulatory Visit: Payer: Self-pay

## 2022-10-25 ENCOUNTER — Encounter: Payer: Self-pay | Admitting: Internal Medicine

## 2022-10-25 MED ORDER — IPRATROPIUM BROMIDE 0.06 % NA SOLN: NASAL | 5 refills | Status: DC

## 2022-10-28 ENCOUNTER — Ambulatory Visit (INDEPENDENT_AMBULATORY_CARE_PROVIDER_SITE_OTHER): Payer: Medicare Other

## 2022-10-28 ENCOUNTER — Ambulatory Visit: Payer: Medicare Other | Admitting: Internal Medicine

## 2022-10-28 VITALS — Ht 66.0 in | Wt 219.0 lb

## 2022-10-28 DIAGNOSIS — Z0000 Encounter for general adult medical examination without abnormal findings: Secondary | ICD-10-CM | POA: Diagnosis not present

## 2022-10-28 NOTE — Patient Instructions (Signed)
[name] , Thank you for taking time to come for your Medicare Wellness Visit. I appreciate your ongoing commitment to your health goals. Please review the following plan we discussed and let me know if I can assist you in the future.   These are the goals we discussed:  Goals       Exercise 150 min/wk Moderate Activity (pt-stated)      Manage My Medicine      Timeframe:  Long-Range Goal Priority:  High Start Date:                             Expected End Date:                       Follow Up Date 11/16/2022   In Progress: - call for medicine refill 2 or 3 days before it runs out - call if I am sick and can't take my medicine - keep a list of all the medicines I take; vitamins and herbals too - use a pillbox to sort medicine    Why is this important?   These steps will help you keep on track with your medicines.   Notes:  Recommend patient call with any questions       Patient Stated      10/16/2020, wants to get under 200 pounds      Patient Stated      10/21/2021, wants to lose weight      Patient Stated      10/28/2022, wants to weigh 200 pounds      Weight (lb) < 200 lb (90.7 kg)      10/10/2019, wants to lose 50 pounds        This is a list of the screening recommended for you and due dates:  Health Maintenance  Topic Date Due   COVID-19 Vaccine (1) Never done   Zoster (Shingles) Vaccine (1 of 2) Never done   HIV Screening  04/30/2023*   Flu Shot  12/09/2022   Colon Cancer Screening  05/12/2023   Medicare Annual Wellness Visit  10/28/2023   Mammogram  08/29/2024   DTaP/Tdap/Td vaccine (3 - Td or Tdap) 04/16/2030   Hepatitis C Screening  Completed   HPV Vaccine  Aged Out   Pap Smear  Discontinued  *Topic was postponed. The date shown is not the original due date.    Advanced directives: Advance directive discussed with you today.   Conditions/risks identified: none  Next appointment: Follow up in one year for your annual wellness visit.   Preventive  Care 40-64 Years, Female Preventive care refers to lifestyle choices and visits with your health care provider that can promote health and wellness. What does preventive care include? A yearly physical exam. This is also called an annual well check. Dental exams once or twice a year. Routine eye exams. Ask your health care provider how often you should have your eyes checked. Personal lifestyle choices, including: Daily care of your teeth and gums. Regular physical activity. Eating a healthy diet. Avoiding tobacco and drug use. Limiting alcohol use. Practicing safe sex. Taking low-dose aspirin daily starting at age 17. Taking vitamin and mineral supplements as recommended by your health care provider. What happens during an annual well check? The services and screenings done by your health care provider during your annual well check will depend on your age, overall health, lifestyle risk factors, and family history  of disease. Counseling  Your health care provider may ask you questions about your: Alcohol use. Tobacco use. Drug use. Emotional well-being. Home and relationship well-being. Sexual activity. Eating habits. Work and work Astronomer. Method of birth control. Menstrual cycle. Pregnancy history. Screening  You may have the following tests or measurements: Height, weight, and BMI. Blood pressure. Lipid and cholesterol levels. These may be checked every 5 years, or more frequently if you are over 31 years old. Skin check. Lung cancer screening. You may have this screening every year starting at age 47 if you have a 30-pack-year history of smoking and currently smoke or have quit within the past 15 years. Fecal occult blood test (FOBT) of the stool. You may have this test every year starting at age 51. Flexible sigmoidoscopy or colonoscopy. You may have a sigmoidoscopy every 5 years or a colonoscopy every 10 years starting at age 32. Hepatitis C blood test. Hepatitis B  blood test. Sexually transmitted disease (STD) testing. Diabetes screening. This is done by checking your blood sugar (glucose) after you have not eaten for a while (fasting). You may have this done every 1-3 years. Mammogram. This may be done every 1-2 years. Talk to your health care provider about when you should start having regular mammograms. This may depend on whether you have a family history of breast cancer. BRCA-related cancer screening. This may be done if you have a family history of breast, ovarian, tubal, or peritoneal cancers. Pelvic exam and Pap test. This may be done every 3 years starting at age 23. Starting at age 98, this may be done every 5 years if you have a Pap test in combination with an HPV test. Bone density scan. This is done to screen for osteoporosis. You may have this scan if you are at high risk for osteoporosis. Discuss your test results, treatment options, and if necessary, the need for more tests with your health care provider. Vaccines  Your health care provider may recommend certain vaccines, such as: Influenza vaccine. This is recommended every year. Tetanus, diphtheria, and acellular pertussis (Tdap, Td) vaccine. You may need a Td booster every 10 years. Zoster vaccine. You may need this after age 68. Pneumococcal 13-valent conjugate (PCV13) vaccine. You may need this if you have certain conditions and were not previously vaccinated. Pneumococcal polysaccharide (PPSV23) vaccine. You may need one or two doses if you smoke cigarettes or if you have certain conditions. Talk to your health care provider about which screenings and vaccines you need and how often you need them. This information is not intended to replace advice given to you by your health care provider. Make sure you discuss any questions you have with your health care provider. Document Released: 05/23/2015 Document Revised: 01/14/2016 Document Reviewed: 02/25/2015 Elsevier Interactive Patient  Education  2017 [employer].    Fall Prevention in the Home Falls can cause injuries. They can happen to people of all ages. There are many things you can do to make your home safe and to help prevent falls. What can I do on the outside of my home? Regularly fix the edges of walkways and driveways and fix any cracks. Remove anything that might make you trip as you walk through a door, such as a raised step or threshold. Trim any bushes or trees on the path to your home. Use bright outdoor lighting. Clear any walking paths of anything that might make someone trip, such as rocks or tools. Regularly check to see if handrails  are loose or broken. Make sure that both sides of any steps have handrails. Any raised decks and porches should have guardrails on the edges. Have any leaves, snow, or ice cleared regularly. Use sand or salt on walking paths during winter. Clean up any spills in your garage right away. This includes oil or grease spills. What can I do in the bathroom? Use night lights. Install grab bars by the toilet and in the tub and shower. Do not use towel bars as grab bars. Use non-skid mats or decals in the tub or shower. If you need to sit down in the shower, use a plastic, non-slip stool. Keep the floor dry. Clean up any water that spills on the floor as soon as it happens. Remove soap buildup in the tub or shower regularly. Attach bath mats securely with double-sided non-slip rug tape. Do not have throw rugs and other things on the floor that can make you trip. What can I do in the bedroom? Use night lights. Make sure that you have a light by your bed that is easy to reach. Do not use any sheets or blankets that are too big for your bed. They should not hang down onto the floor. Have a firm chair that has side arms. You can use this for support while you get dressed. Do not have throw rugs and other things on the floor that can make you trip. What can I do in the  kitchen? Clean up any spills right away. Avoid walking on wet floors. Keep items that you use a lot in easy-to-reach places. If you need to reach something above you, use a strong step stool that has a grab bar. Keep electrical cords out of the way. Do not use floor polish or wax that makes floors slippery. If you must use wax, use non-skid floor wax. Do not have throw rugs and other things on the floor that can make you trip. What can I do with my stairs? Do not leave any items on the stairs. Make sure that there are handrails on both sides of the stairs and use them. Fix handrails that are broken or loose. Make sure that handrails are as long as the stairways. Check any carpeting to make sure that it is firmly attached to the stairs. Fix any carpet that is loose or worn. Avoid having throw rugs at the top or bottom of the stairs. If you do have throw rugs, attach them to the floor with carpet tape. Make sure that you have a light switch at the top of the stairs and the bottom of the stairs. If you do not have them, ask someone to add them for you. What else can I do to help prevent falls? Wear shoes that: Do not have high heels. Have rubber bottoms. Are comfortable and fit you well. Are closed at the toe. Do not wear sandals. If you use a stepladder: Make sure that it is fully opened. Do not climb a closed stepladder. Make sure that both sides of the stepladder are locked into place. Ask someone to hold it for you, if possible. Clearly mark and make sure that you can see: Any grab bars or handrails. First and last steps. Where the edge of each step is. Use tools that help you move around (mobility aids) if they are needed. These include: Canes. Walkers. Scooters. Crutches. Turn on the lights when you go into a dark area. Replace any light bulbs as soon as they burn  out. Set up your furniture so you have a clear path. Avoid moving your furniture around. If any of your floors are  uneven, fix them. If there are any pets around you, be aware of where they are. Review your medicines with your doctor. Some medicines can make you feel dizzy. This can increase your chance of falling. Ask your doctor what other things that you can do to help prevent falls. This information is not intended to replace advice given to you by your health care provider. Make sure you discuss any questions you have with your health care provider. Document Released: 02/20/2009 Document Revised: 10/02/2015 Document Reviewed: 05/31/2014 Elsevier Interactive Patient Education  2017 [employer].

## 2022-10-28 NOTE — Progress Notes (Signed)
Subjective:   Grace [name] is a 62 y.o. female who presents for Medicare Annual (Subsequent) preventive examination.  Visit Complete: Virtual  I connected with  Grace [name] on 10/28/22 by a audio enabled telemedicine application and verified that I am speaking with the correct person using two identifiers.  Patient Location: Home  Provider Location: Office/Clinic  I discussed the limitations of evaluation and management by telemedicine. The patient expressed understanding and agreed to proceed.  Patient Medicare AWV questionnaire was completed by the patient on 10/27/2022; I have confirmed that all information answered by patient is correct and no changes since this date.  Review of Systems     Cardiac Risk Factors include: dyslipidemia;obesity (BMI >30kg/m2)     Objective:    Today's Vitals   10/28/22 1356  Weight: 219 lb (99.3 kg)  Height: 5\' 6"  (1.676 m)  PainSc: 4    Body mass index is 35.35 kg/m.     10/28/2022    2:04 PM 05/24/2022    9:08 AM 10/21/2021    2:45 PM 10/16/2020    3:04 PM 10/10/2019    2:49 PM 03/13/2019   10:58 AM 04/27/2018   10:59 AM  Advanced Directives  Does Patient Have a Medical Advance Directive? No No No No No No No  Would patient like information on creating a medical advance directive?  No - Patient declined No - Patient declined No - Patient declined Yes (MAU/Ambulatory/Procedural Areas - Information given)  Yes (MAU/Ambulatory/Procedural Areas - Information given)    Current Medications (verified) Outpatient Encounter Medications as of 10/28/2022  Medication Sig   aspirin EC 81 MG tablet Take 81 mg by mouth daily.   baclofen (LIORESAL) 10 MG tablet TAKE 1 TABLET BY MOUTH 3 TIMES DAILY.   colestipol (COLESTID) 1 g tablet Take 1 tablet (1 g total) by mouth 2 (two) times daily.   dicyclomine (BENTYL) 10 MG capsule Take 1 capsule (10 mg total) by mouth 3 (three) times daily before meals.   diphenhydrAMINE (BENADRYL) 25 MG tablet Take 25  mg by mouth 3 (three) times daily. Takes with Hydrocodone   doxepin (SINEQUAN) 10 MG capsule TAKE 1-2 CAPSULES (10-20 MG TOTAL) BY MOUTH AT BEDTIME.   gabapentin (NEURONTIN) 300 MG capsule Take 1 capsule (300 mg total) by mouth 2 (two) times daily.   HYDROcodone-acetaminophen (NORCO/VICODIN) 5-325 MG tablet Take 1 tablet by mouth 3 (three) times daily as needed for moderate pain.   ipratropium (ATROVENT) 0.06 % nasal spray Use 2 sprays each nostril bid prn drainage   levocetirizine (XYZAL) 5 MG tablet TAKE 1 TABLET BY MOUTH EVERY DAY IN THE EVENING   Melatonin 10 MG TABS Take 1 tablet by mouth at bedtime.    ondansetron (ZOFRAN-ODT) 4 MG disintegrating tablet Take 1 tablet (4 mg total) by mouth every 8 (eight) hours as needed for nausea or vomiting.   rizatriptan (MAXALT-MLT) 10 MG disintegrating tablet Take 1 tablet (10 mg total) by mouth as needed for migraine. May repeat in 2 hours if needed.  Max of 2 tablets in 24 hours, and 10 tablets per month   tiZANidine (ZANAFLEX) 2 MG tablet TAKE UP TO 4 TABLETS BY MOUTH EVERY DAY   Vitamin D, Ergocalciferol, (DRISDOL) 1.25 MG (50000 UNIT) CAPS capsule ONE CAPSULE TWICE WEEKLY ON TUESDAYS/FRIDAYS   Ascorbic Acid (VITAMIN C) 1000 MG tablet Take 1,000 mg by mouth daily. (Patient not taking: Reported on 10/28/2022)   AZO-CRANBERRY PO Take 1 capsule by mouth daily at  12 noon. (Patient not taking: Reported on 10/28/2022)   BLACK ELDERBERRY PO Take 300 mg by mouth in the morning and at bedtime. (Patient not taking: Reported on 10/28/2022)   Cholecalciferol (VITAMIN D) 50 MCG (2000 UT) CAPS Take 2,000 Units by mouth daily.  (Patient not taking: Reported on 10/28/2022)   CINNAMON PO Take 1,200 mg by mouth daily. (Patient not taking: Reported on 10/28/2022)   L-Lysine 1000 MG TABS Take by mouth. As needed (Patient not taking: Reported on 10/28/2022)   Lactobacillus (PROBIOTIC ACIDOPHILUS PO) Take by mouth daily. Digestive Advantage BC   bacillus coagulans (Patient not  taking: Reported on 10/28/2022)   vitamin E 1000 UNIT capsule  (Patient not taking: Reported on 10/28/2022)   No facility-administered encounter medications on file as of 10/28/2022.    Allergies (verified) Diclofenac, Lamictal [lamotrigine], Mobic [meloxicam], Ultram [tramadol hcl], Nefazodone, Banana, Beef-derived products, Betaseron [interferon beta-1b], Codeine, Copaxone [glatiramer acetate], Flomax [tamsulosin hcl], Gabapentin, Latex, Nitrofurantoin, Oxcarbazepine, Pork-derived products, Teriflunomide, Trazodone and nefazodone, and Tape   History: Past Medical History:  Diagnosis Date   Allergy    Anxiety    Avascular necrosis (HCC)    Back pain    Cancer (HCC)    Cataract    Had surgery   Chest pain    Chewing difficulty    Chronic fatigue syndrome    Colon polyp    pre-cancerous   Complication of anesthesia    patient states she requires more anesthesia   Depression    Disorder of soft tissue    Diverticulosis    Dizziness    Edema, lower extremity    Fatty liver    Fibromyalgia    GERD (gastroesophageal reflux disease)    Glaucoma 07-2019   From surgery   H/O breast biopsy    pre- cancerous cells   Hand fracture, left    9/87   Heart palpitations    Heart valve disease    History of Holter monitoring    Hyperlipidemia    Insomnia    Joint pain    Lactose intolerance    Lhermitte's syndrome    Migraines    Multiple food allergies    Multiple sclerosis (HCC)    dx in 11/1996   Multiple sclerosis (HCC)    Neuromuscular disorder (HCC)    Neuropathy    Numbness and tingling in hands    Obesity    Optic neuritis    OSA (obstructive sleep apnea)    no longer have OSA since stopped taking a MS medication   Osteoarthritis    Otitis media    Palpitations    Prediabetes    Raynaud's phenomenon    Shortness of breath    Sleep apnea    Due to medication that she no longer takes.   Swallowing difficulty    Vaginitis and vulvovaginitis    Vitamin B12  deficiency    Vitamin D deficiency    Past Surgical History:  Procedure Laterality Date   ABDOMINAL HYSTERECTOMY Bilateral 02/12/2016   Procedure: HYSTERECTOMY ABDOMINAL WITH BILATERAL SALPINGO OOPHERECTOMY;  Surgeon: Marcelle Overlie, MD;  Location: [facility];  Service: Gynecology;  Laterality: Bilateral;   BREAST EXCISIONAL BIOPSY     core/left breast   CATARACT EXTRACTION, BILATERAL  06/2019   CHOLECYSTECTOMY  1983   COLONOSCOPY     1997/2001   DILATION AND CURETTAGE OF UTERUS     ENDOMETRIAL ABLATION  2012   EXTRACORPOREAL SHOCK WAVE LITHOTRIPSY Left 05/24/2022   Procedure: LEFT  EXTRACORPOREAL SHOCK WAVE LITHOTRIPSY (ESWL);  Surgeon: Crist Fat, MD;  Location: [facility];  Service: Urology;  Laterality: Left;   EYE SURGERY     JOINT REPLACEMENT     LAPAROSCOPIC ABDOMINAL EXPLORATION     ovaries and intestines bond together   LEFT HEART CATHETERIZATION WITH CORONARY ANGIOGRAM N/A 06/21/2011   Procedure: LEFT HEART CATHETERIZATION WITH CORONARY ANGIOGRAM;  Surgeon: Marykay Lex, MD;  Location: [facility];  Service: Cardiovascular;  Laterality: N/A;   LUMBAR PUNCTURE     11/01/1996   SALPINGOOPHORECTOMY Bilateral 02/12/2016   Procedure: BILATERAL SALPINGO OOPHORECTOMY;  Surgeon: Marcelle Overlie, MD;  Location: [facility];  Service: Gynecology;  Laterality: Bilateral;   TOTAL HIP ARTHROPLASTY     left hip  6/02  /right hip12/04   Family History  Problem Relation Age of Onset   Pancreatic cancer Mother    Stroke Mother    Hypertension Mother    Hyperlipidemia Mother    Heart disease Mother    Cancer Mother    Depression Mother    Anxiety disorder Mother    Alcoholism Mother    Alcohol abuse Mother    Arthritis Mother    Testicular cancer Brother    Cancer Brother    Colon cancer Maternal Uncle    Pancreatic cancer Paternal Uncle    Prostate cancer Maternal Grandfather    Colon polyps Father    Diabetes Father    Hypertension Father     Hyperlipidemia Father    Kidney disease Father    Sleep apnea Father    Obesity Father    Arthritis Father    Asthma Father    COPD Father    Heart disease Father    Asthma Paternal Grandfather    COPD Paternal Grandfather    Heart disease Paternal Grandfather    Cancer Paternal Grandmother    ADD / ADHD Son    Anxiety disorder Son    Hypertension Son    Cancer Maternal Uncle    Cancer Paternal Uncle    Hypertension Daughter    Social History   Socioeconomic History   Marital status: Widowed    Spouse name: Not on file   Number of children: Not on file   Years of education: Not on file   Highest education level: 12th grade  Occupational History   Occupation: disabled  Tobacco Use   Smoking status: Former    Packs/day: 0.25    Years: 3.00    Additional pack years: 0.00    Total pack years: 0.75    Types: Cigarettes    Quit date: 07/01/1995    Years since quitting: 27.3   Smokeless tobacco: Never  Vaping Use   Vaping Use: Never used  Substance and Sexual Activity   Alcohol use: No   Drug use: Yes    Types: Hydrocodone   Sexual activity: Not Currently    Birth control/protection: Abstinence, Post-menopausal, Surgical  Other Topics Concern   Not on file  Social History Narrative   Not on file   Social Determinants of Health   Financial Resource Strain: Low Risk  (10/27/2022)   Overall Financial Resource Strain (CARDIA)    Difficulty of Paying Living Expenses: Not hard at all  Food Insecurity: No Food Insecurity (10/27/2022)   Hunger Vital Sign    Worried About Running Out of Food in the Last Year: Never true    Ran Out of Food in the Last Year: Never true  Recent  Concern: Food Insecurity - Food Insecurity Present (10/18/2022)   Hunger Vital Sign    Worried About Running Out of Food in the Last Year: Sometimes true    Ran Out of Food in the Last Year: Sometimes true  Transportation Needs: No Transportation Needs (10/27/2022)   PRAPARE - Doctor, general practice (Medical): No    Lack of Transportation (Non-Medical): No  Physical Activity: Insufficiently Active (10/27/2022)   Exercise Vital Sign    Days of Exercise per Week: 2 days    Minutes of Exercise per Session: 60 min  Stress: No Stress Concern Present (10/27/2022)   [employer] of Occupational Health - Occupational Stress Questionnaire    Feeling of Stress : Only a little  Social Connections: Moderately Integrated (10/27/2022)   Social Connection and Isolation Panel [NHANES]    Frequency of Communication with Friends and Family: Three times a week    Frequency of Social Gatherings with Friends and Family: Once a week    Attends Religious Services: 1 to 4 times per year    Active Member of [employer] or Organizations: Yes    Attends Banker Meetings: Not on file    Marital Status: Widowed  Recent Concern: Social Connections - Moderately Isolated (10/18/2022)   Social Connection and Isolation Panel [NHANES]    Frequency of Communication with Friends and Family: More than three times a week    Frequency of Social Gatherings with Friends and Family: Twice a week    Attends Religious Services: 1 to 4 times per year    Active Member of [employer] or Organizations: No    Attends Banker Meetings: Not on file    Marital Status: Widowed    Tobacco Counseling Counseling given: Not Answered   Clinical Intake:  Pre-visit preparation completed: Yes  Pain : 0-10 Pain Score: 4  Pain Type: Chronic pain Pain Location: Head Pain Descriptors / Indicators: Aching Pain Onset: More than a month ago Pain Frequency: Intermittent     Nutritional Status: BMI > 30  Obese Nutritional Risks: Nausea/ vomitting/ diarrhea (nauseous this morning) Diabetes: No  How often do you need to have someone help you when you read instructions, pamphlets, or other written materials from your doctor or pharmacy?: 1 - Never  Interpreter Needed?: No  Information entered by ::  NAllen LPN   Activities of Daily Living    10/27/2022    6:31 PM 05/24/2022    9:13 AM  In your present state of health, do you have any difficulty performing the following activities:  Hearing? 0 0  Vision? 0 0  Difficulty concentrating or making decisions? 1 1  Comment memory trouble   Walking or climbing stairs? 0 0  Dressing or bathing? 0 0  Doing errands, shopping? 0   Preparing Food and eating ? N   Using the Toilet? N   In the past six months, have you accidently leaked urine? Y   Do you have problems with loss of bowel control? Y   Managing your Medications? N   Managing your Finances? N   Housekeeping or managing your Housekeeping? Y     Patient Care Team: [name], MD as PCP - General (Internal Medicine) Sater, Pearletha Furl, MD (Neurology) Harlan Stains, [facility] (Inactive) (Pharmacist)  Indicate any recent Medical Services you may have received from other than [facility] providers in the past year (date may be approximate).     Assessment:   This is a  routine wellness examination for Grace [name].  Hearing/Vision screen Vision Screening - Comments:: Regular eye exams, [facility]  Dietary issues and exercise activities discussed:     Goals Addressed             This Visit's Progress    Patient Stated       10/28/2022, wants to weigh 200 pounds       Depression Screen    10/28/2022    2:06 PM 10/19/2022    9:49 AM 04/29/2022    3:00 PM 10/21/2021    2:46 PM 05/20/2021   12:43 PM 04/28/2021    9:17 AM 10/16/2020    3:05 PM  PHQ 2/9 Scores  PHQ - 2 Score 0 1 0 0 2 4   PHQ- 9 Score  12 0  9 17   Exception Documentation       Other- indicate reason in comment box  Not completed       completed by MD    Fall Risk    10/27/2022    6:31 PM 10/19/2022    9:49 AM 10/21/2021    2:45 PM 10/16/2020    3:05 PM 10/10/2019    2:51 PM  Fall Risk   Falls in the past year? 0 0 0 0 1  Comment     clumsy, has MS  Number falls in past yr: 0 0 0    Injury with  Fall? 0 0 0    Risk for fall due to : Medication side effect No Fall Risks Medication side effect Medication side effect History of fall(s);Medication side effect  Follow up Falls prevention discussed;Education provided;Falls evaluation completed Falls evaluation completed Falls evaluation completed;Education provided;Falls prevention discussed Falls evaluation completed;Education provided;Falls prevention discussed Falls evaluation completed;Education provided;Falls prevention discussed    MEDICARE RISK AT HOME:   TIMED UP AND GO:  Was the test performed?  No    Cognitive Function:        10/28/2022    2:06 PM 10/21/2021    2:47 PM 10/16/2020    3:07 PM 10/10/2019    2:55 PM 03/13/2019   11:02 AM  6CIT Screen  What Year? 0 points 0 points 0 points 0 points 0 points  What month? 0 points 0 points 0 points 0 points 0 points  What time? 0 points 0 points 0 points 0 points 0 points  Count back from 20 0 points 0 points 0 points 0 points 0 points  Months in reverse 2 points 0 points 2 points 2 points 2 points  Repeat phrase 4 points 6 points 2 points 4 points 6 points  Total Score 6 points 6 points 4 points 6 points 8 points    Immunizations Immunization History  Administered Date(s) Administered   Tdap 03/21/2012, 04/16/2020    TDAP status: Up to date  Flu Vaccine status: Declined, Education has been provided regarding the importance of this vaccine but patient still declined. Advised may receive this vaccine at local pharmacy or Health Dept. Aware to provide a copy of the vaccination record if obtained from local pharmacy or Health Dept. Verbalized acceptance and understanding.  Pneumococcal vaccine status: Up to date  Covid-19 vaccine status: Declined, Education has been provided regarding the importance of this vaccine but patient still declined. Advised may receive this vaccine at local pharmacy or Health Dept.or vaccine clinic. Aware to provide a copy of the vaccination record  if obtained from local pharmacy or Health Dept. Verbalized acceptance and understanding.  Qualifies for Shingles Vaccine? Yes   Zostavax completed No   Shingrix Completed?: No.    Education has been provided regarding the importance of this vaccine. Patient has been advised to call insurance company to determine out of pocket expense if they have not yet received this vaccine. Advised may also receive vaccine at local pharmacy or Health Dept. Verbalized acceptance and understanding.  Screening Tests Health Maintenance  Topic Date Due   COVID-19 Vaccine (1) Never done   Zoster Vaccines- Shingrix (1 of 2) Never done   Medicare Annual Wellness (AWV)  10/22/2022   HIV Screening  04/30/2023 (Originally 04/24/1976)   INFLUENZA VACCINE  12/09/2022   Colonoscopy  05/12/2023   MAMMOGRAM  08/29/2024   DTaP/Tdap/Td (3 - Td or Tdap) 04/16/2030   Hepatitis C Screening  Completed   HPV VACCINES  Aged Out   PAP SMEAR-Modifier  Discontinued    Health Maintenance  Health Maintenance Due  Topic Date Due   COVID-19 Vaccine (1) Never done   Zoster Vaccines- Shingrix (1 of 2) Never done   Medicare Annual Wellness (AWV)  10/22/2022    Colorectal cancer screening: Type of screening: Colonoscopy. Completed 05/11/2018. Repeat every 5 years  Mammogram status: Completed 08/30/2022. Repeat every year  Bone Density status: n/a  Lung Cancer Screening: (Low Dose CT Chest recommended if Age 72-80 years, 20 pack-year currently smoking OR have quit w/in 15years.) does not qualify.   Lung Cancer Screening Referral: no  Additional Screening:  Hepatitis C Screening: does qualify; Completed 10/21/2015  Vision Screening: Recommended annual ophthalmology exams for early detection of glaucoma and other disorders of the eye. Is the patient up to date with their annual eye exam?  Yes  Who is the provider or what is the name of the office in which the patient attends annual eye exams? [facility] If pt is  not established with a provider, would they like to be referred to a provider to establish care? No .   Dental Screening: Recommended annual dental exams for proper oral hygiene  Diabetic Foot Exam: n/a  Community Resource Referral / Chronic Care Management: CRR required this visit?  No   CCM required this visit?  No     Plan:     I have personally reviewed and noted the following in the patient's chart:   Medical and social history Use of alcohol, tobacco or illicit drugs  Current medications and supplements including opioid prescriptions. Patient is currently taking opioid prescriptions. Information provided to patient regarding non-opioid alternatives. Patient advised to discuss non-opioid treatment plan with their provider. Functional ability and status Nutritional status Physical activity Advanced directives List of other physicians Hospitalizations, surgeries, and ER visits in previous 12 months Vitals Screenings to include cognitive, depression, and falls Referrals and appointments  In addition, I have reviewed and discussed with patient certain preventive protocols, quality metrics, and best practice recommendations. A written personalized care plan for preventive services as well as general preventive health recommendations were provided to patient.     Barb Merino, LPN   08/16/8117   After Visit Summary: (MyChart) Due to this being a telephonic visit, the after visit summary with patients personalized plan was offered to patient via MyChart   Nurse Notes: none

## 2022-11-15 ENCOUNTER — Encounter: Payer: Self-pay | Admitting: Neurology

## 2022-11-16 ENCOUNTER — Other Ambulatory Visit: Payer: Self-pay

## 2022-11-16 ENCOUNTER — Other Ambulatory Visit (HOSPITAL_COMMUNITY): Payer: Self-pay

## 2022-11-16 ENCOUNTER — Other Ambulatory Visit: Payer: Medicare Other | Admitting: Pharmacist

## 2022-11-16 ENCOUNTER — Telehealth: Payer: Self-pay | Admitting: Neurology

## 2022-11-16 ENCOUNTER — Telehealth: Payer: Medicare Other

## 2022-11-16 DIAGNOSIS — G35 Multiple sclerosis: Secondary | ICD-10-CM

## 2022-11-16 MED ORDER — EPINEPHRINE 0.3 MG/0.3ML IJ SOAJ
0.3000 mg | INTRAMUSCULAR | 2 refills | Status: DC | PRN
  Filled 2022-11-16: qty 2, 28d supply, fill #0
  Filled 2022-11-16: qty 1, 28d supply, fill #0

## 2022-11-16 NOTE — Telephone Encounter (Signed)
[employer] medicare NPR sent to GI 

## 2022-11-16 NOTE — Progress Notes (Unsigned)
11/16/2022 Name: Grace [name] MRN: [id number] DOB: [birth year]  Chief Complaint  Patient presents with   Medication Management    Grace [name] is a 62 y.o. year old female who presented for a telephone visit.   They were referred to the pharmacist by their PCP for assistance in managing complex medication management.    Subjective:  Care Team: Primary Care Provider: [name], MD ; Next Scheduled Visit: 05/02/23 Neurologist: Sater/Lomax; Next Scheduled Visit: 11/23/22 [facility]: liver specialist   Medication Access/Adherence  Current Pharmacy:  [id number] [place], [place] - [address] [place]  [address] Phone: [phone] Fax: [phone]  [facility] - [place], [place] - [address] [facility] [address] [address] Phone: [phone] Fax: [phone]  Mychart Amb Medication Adherence Questionnaire   11/15/2022  5:05 PM EDT - Ceasar Mons by Patient  During the past 2 weeks, have you missed any doses of your medication(s)? Yes  During the past 2 weeks, have you missed any doses of your medications because you could not afford them? No  During the past 2 weeks, have you missed any doses of your medications because you could not get to the pharmacy? No  During the past 2 weeks, have you missed any doses of your medications because you could not get refills? No  During the past 2 weeks, have you missed any doses of your medication because you do not know what the medication is for? No  During the past 2 weeks, have you missed any doses of your medications because your medications make you feel sick? No  During the past 2 weeks, have you missed any doses of your medications because you have too many medications? No  During the past 2 weeks, have you missed any doses of your medications because you forgot to take your medications? Yes  During the past 2 weeks,  have you missed any doses of your medications because you feel fine and do not think you need the medication(s)? Yes  Have you missed doses of your medications in the past 2 weeks for any other reason not listed above? No     Patient reports affordability concerns with their medications: No  Patient reports access/transportation concerns to their pharmacy: No  Patient reports adherence concerns with their medications:  No    Does note that her current insurance was through her husband and will end next April. Is not sure what other Medicare options.   Medication Management:  Reports concerns related to cost of Epi-Pen. Previously was unable to afford. Believes she worked on patient assistance with [employer] but this is not documented.   Notes that she has been asked to hold several of her supplements until she is seen by [facility] specialist. She asks today if her vitamin D level is high enough and if she should take Vitamin K - she has had friends with multiple sclerosis tell her to take it.   Follows up with neurology next week, plans to discuss the above supplement questions, more frequent headaches, and pain management concerns.   Objective:  Lab Results  Component Value Date   HGBA1C 6.1 (H) 10/19/2022    Lab Results  Component Value Date   CREATININE 0.67 10/19/2022   BUN 10 10/19/2022   NA 139 10/19/2022   K 4.5 10/19/2022   CL 105 10/19/2022   CO2 21 10/19/2022    Lab Results  Component Value Date   CHOL 154 10/19/2022   HDL 57 10/19/2022   LDLCALC 71 10/19/2022   TRIG 154 (H) 10/19/2022   CHOLHDL 2.7 10/19/2022    Medications Reviewed Today     Reviewed by [name] Hipp, RPH-CPP (Pharmacist) on 11/16/22 at 1459  Med List Status: <None>   Medication Order Taking? Sig Documenting Provider Last Dose Status Informant  Ascorbic Acid (VITAMIN C) 1000 MG tablet [id number] No Take 1,000 mg by mouth daily.  Patient not taking: Reported on 10/28/2022   [provider] Not Taking Active Self           Med Note (RATLIFF, THURSHELL   Thu Aug 26, 2022  9:54 AM) Not taking these Rx because of the elevated liver   aspirin EC 81 MG tablet [id number] Yes Take 81 mg by mouth daily. [provider] Taking Active Self  AZO-CRANBERRY PO [id number] No Take 1 capsule by mouth daily at 12 noon.  Patient not taking: Reported on 10/28/2022   [provider] Not Taking Active   baclofen (LIORESAL) 10 MG tablet [id number] Yes TAKE 1 TABLET BY MOUTH 3 TIMES DAILY. Sater, Pearletha Furl, MD Taking Active            Med Note Clearance Coots, Hanifa Antonetti T   Tue Nov 16, 2022  2:52 PM) Using as eneded  BLACK ELDERBERRY PO [id number] No Take 300 mg by mouth in the morning and at bedtime.  Patient not taking: Reported on 10/28/2022   [provider] Not Taking Active   Cholecalciferol (VITAMIN D) 50 MCG (2000 UT) CAPS [id number] Yes Take 2,000 Units by mouth daily. [provider] Taking Active   CINNAMON PO [id number] No Take 1,200 mg by mouth daily.  Patient not taking: Reported on 10/28/2022   [provider] Not Taking Active   colestipol (COLESTID) 1 g tablet [id number] Yes Take 1 tablet (1 g total) by mouth 2 (two) times daily. Meryl Dare, MD Taking Active   dicyclomine (BENTYL) 10 MG capsule [id number] Yes Take 1 capsule (10 mg total) by mouth 3 (three) times daily before meals. Meryl Dare, MD Taking Active   diphenhydrAMINE (BENADRYL) 25 MG tablet [id number]  Take 25 mg by mouth 3 (three) times daily. Takes with Hydrocodone [provider]  Active Self  doxepin (SINEQUAN) 10 MG capsule [id number] Yes TAKE 1-2 CAPSULES (10-20 MG TOTAL) BY MOUTH AT BEDTIME. Sater, Pearletha Furl, MD Taking Active   gabapentin (NEURONTIN) 300 MG capsule [id number] Yes Take 1 capsule (300 mg total) by mouth 2 (two) times daily. Sater, Pearletha Furl, MD Taking Active   HYDROcodone-acetaminophen (NORCO/VICODIN) 5-325 MG tablet [id number] Yes Take 1 tablet by  mouth 3 (three) times daily as needed for moderate pain. Sater, Pearletha Furl, MD Taking Active   ipratropium (ATROVENT) 0.06 % nasal spray [id number] Yes Use 2 sprays each nostril bid prn drainage [name], MD Taking Active   L-Lysine 1000 MG TABS [id number] No Take by mouth. As needed  Patient not taking: Reported on 10/28/2022   [provider] Not Taking Active   Lactobacillus (PROBIOTIC ACIDOPHILUS PO) [id number] No Take by mouth daily. [employer]   bacillus coagulans  Patient not taking: Reported on 10/28/2022   [provider] Not Taking Active   levocetirizine (XYZAL) 5 MG tablet [id number] Yes TAKE 1 TABLET BY MOUTH EVERY DAY IN THE Josephine Igo, MD Taking Active   Melatonin 10 MG TABS [id number] Yes Take 1 tablet by  mouth at bedtime.  [provider] Taking Active Self  ondansetron (ZOFRAN-ODT) 4 MG disintegrating tablet [id number] Yes Take 1 tablet (4 mg total) by mouth every 8 (eight) hours as needed for nausea or vomiting. Jeannie Fend, PA-C Taking Active   rizatriptan (MAXALT-MLT) 10 MG disintegrating tablet [id number] Yes Take 1 tablet (10 mg total) by mouth as needed for migraine. May repeat in 2 hours if needed.  Max of 2 tablets in 24 hours, and 10 tablets per month Sater, Pearletha Furl, MD Taking Active   tiZANidine (ZANAFLEX) 2 MG tablet [id number] Yes TAKE UP TO 4 TABLETS BY MOUTH EVERY DAY Sater, Pearletha Furl, MD Taking Active   Vitamin D, Ergocalciferol, (DRISDOL) 1.25 MG (50000 UNIT) CAPS capsule [id number] Yes ONE CAPSULE TWICE WEEKLY ON TUESDAYS/FRIDAYS [name], MD Taking Active   vitamin E 1000 UNIT capsule [id number] No   Patient not taking: Reported on 10/28/2022   [provider] Not Taking Active               Assessment/Plan:   Medication Management: - Currently strategy sufficient to maintain appropriate adherence to prescribed medication regimen - Encouraged to discuss above noted concerns with neurology  next week.  - Will provide with information on [place] [facility] for Medicare Open Enrollment period. - Investigated cost of generic epinephrine.   Follow Up Plan: pharmacy needs met. Closing case  Catie Eppie Gibson, PharmD, BCACP, CPP Clinical Pharmacist [facility] [phone]

## 2022-11-17 ENCOUNTER — Other Ambulatory Visit (HOSPITAL_COMMUNITY): Payer: Self-pay

## 2022-11-17 ENCOUNTER — Other Ambulatory Visit: Payer: Self-pay | Admitting: Neurology

## 2022-11-17 DIAGNOSIS — M542 Cervicalgia: Secondary | ICD-10-CM

## 2022-11-17 DIAGNOSIS — G35 Multiple sclerosis: Secondary | ICD-10-CM

## 2022-11-17 DIAGNOSIS — R519 Headache, unspecified: Secondary | ICD-10-CM

## 2022-11-17 NOTE — Telephone Encounter (Signed)
[employer] medicare NPR sent to GI [phone] 

## 2022-11-17 NOTE — Patient Instructions (Signed)
Markiyah,   Talk to your neurology team about those questions related to supplement doses or goals related to multiple sclerosis.   Thanks!  Catie Eppie Gibson, PharmD, BCACP, CPP Clinical Pharmacist [facility] [phone]

## 2022-11-18 ENCOUNTER — Other Ambulatory Visit (HOSPITAL_COMMUNITY): Payer: Self-pay

## 2022-11-22 NOTE — Patient Instructions (Signed)

## 2022-11-22 NOTE — Progress Notes (Unsigned)
PATIENT: Grace [name] DOB: April 21, 1961  REASON FOR VISIT: follow up HISTORY FROM: patient  No chief complaint on file.  HISTORY OF PRESENT ILLNESS:  11/22/22 ALL: Grace [name] is a 62 y.o. female here today for follow up for MS. She is not currently on DMT. MRI brain 05/2021 was stable with no new lesions compared to 2019. MRI cervical spine did show 1 new focus and two older lesions in 07/2020.   She reports that she hasn't felt well for the past two weeks she has had new symptoms. She describes a burning sensation of left side of her head at base of neck. Pain is constant but seems better in the mornings. She has had migraines in the past but this feels different. Headaches does not respond to gabapentin, rizatriptan, baclofen, tizanidine or Norco. She has nausea late in the afternoons. She reports blurry vision bilaterally that is worse in the evening. She was seen by ophthalmology about 2 weeks before pain started and exam was reportedly normal.   She continues gabapentin 300mg  daily-TID as needed, Norco 5-325 TID PRN and tizanidine 2mg  up to QID as needed for pain. Baclofen at night to help with RLS. Dysesthesias in hands and feet are stable.   She continues Concerta for fatigue and inattention. She does feel that it helps. No significant change in headaches. She uses rizatriptan for abortive therapy. She may have a migraine 4-5 times a year.   She continues to have difficulty sleeping. She has difficulty getting to and staying asleep. She is taking doxepin 10-20mg  QHS. She does not sleep well. She sleeps a hour or two at time. Ambien not used often due to concerns of side effects.   She denies changes in gait, no weakness, no changes in bowel or bladder habits.    HISTORY: (copied from Grace [name] previous note)  Grace [name] is a 62 y.o. woman with relapsing remitting multiple sclerosis.      Update 07/13/2022: She is off a  DMT for MS.  She is neurologically stable.   She  reports a recent kidney stone and needed lithotripsy.    She is waling ok.  She needs to use th bannister going downstairs.   She has tingling in her arms and feet..   She feels her hand grip is reduced.   She has numbness in both feet.   NCV/EMG had not shown neuropathy.     She has some jerks and sspasms and balcofen/tizandine combo helps more than either by themselves -- takes bid mostly.     She has urinary urgency.  She had an allergic reaction to Flomax and stopped.  No recent UTI   She continues to have vision problems since cataract surgery and complications with elevated IOP.     She is sleeping poorly most nights - often waking up after a couple hours sleep and not falling back asleep.   She does take hydrocodone, baclofen, doxepin, melatonin, benadryl at night.  Temazepma helped her fall asleep but not stay asleep.     Ambien had helped her some in the past but she is concerned about side effects (one of her friends had sleep-driving and wrecked).     She has pain in the neck, back.  She gets some benefit from gabapentin and hydrocodone.   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 She had optic neuritis in 1999. In 2002, she  had right-sided numbness. MRIs of the brain show just a couple of nonspecific foci. A cervical spine MRI showed a focus consistent with demyelination at C5. Additionally, there are 2 smaller foci at T2 and T3-T4.  CSF was also abnormal c/w MS.     She was initially placed on Betaseron and then on Copaxone. She was unable to tolerate either of these and has not been on any DMT medication since 2003.   She retried Betaseron but stopped after diagnosis of avascular necrosis requiring hip replacement.  She was on Copaxone for a while.   Of note, she had had only one course of IV steroids prior to that and has had some afterwards.   She had not wanted to go on Tysabri or Gilenya and opted to go on no disease modifying  therapy since 2012. At her last visit on 01/17/2014, she decided to go on Aubagio but due to elevated liver function test stopped..     Most recent MRIs are from earlier this year and showed no changes.  She has been off of all disease modifying therapy since 2018.   IMAGING 02/05/2018: Brain MRI showed "Multiple T2/FLAIR hyperintense foci in the hemispheres.  This is a nonspecific finding and could be due to chronic microvascular ischemic changes or 2 demyelination.  None of the foci appears to be acute.  When compared to the MRI dated 07/03/2016, there is no interval change.  There is a normal enhancement pattern.   MRI cervical spine 07/27/2020  three T2 hyperintense foci within the spinal cord, adjacent to C5, C6-C7 and T2.  None of the foci enhance.  The foci adjacent to C5 and T2 were present on the previous MRI.  The focus adjacent to C6-C7 is not seen on the previous MRI from 2016 and is consistent with a more recent demyelinating plaque associated with her MS. 2.   Degenerative changes at C4-C5 through C6-C7 as detailed above.  There is mild spinal stenosis at C5-C6 and borderline spinal stenosis at C6-C7 but there does not appear to be any nerve root compression.  Degenerative changes at C4-C5 and C6-C7 have slightly progressed compared to the previous MRI from 2016.   MRI Brain 05/17/2021 unchanged compared o 02/03/2018  REVIEW OF SYSTEMS: Out of a complete 14 system review of symptoms, the patient complains only of the following symptoms, weakness, numbness, headaches, occipital pain and all other reviewed systems are negative.  ALLERGIES: Allergies  Allergen Reactions   Diclofenac Anaphylaxis   Lamictal [Lamotrigine] Shortness Of Breath, Itching and Swelling    Tongue swells   Mobic [Meloxicam] Anaphylaxis   Ultram [Tramadol Hcl] Anaphylaxis   Nefazodone Itching and Swelling    Other reaction(s): Arthralgia (Joint Pain); Throat swelling, mouth sores   Banana    Beef-Derived Products     Betaseron [Interferon Beta-1b] Other (See Comments)    Caused avascular necrosis   Codeine Itching   Copaxone [Glatiramer Acetate] Other (See Comments)    Altered mental status: mental changes, anxiety, motion sickness   Flomax [Tamsulosin Hcl] Itching   Gabapentin Itching    Itching if she takes more than 300mg    Latex     Latex band-aids cause redness and tears your skin   Nitrofurantoin     Causing itching all over, tongue swelling, chest heaviness, swollen lips, cough, rapid HR, swollen eyelids, red eyes/gums, lips red/white per pt.   Oxcarbazepine Itching and Other (See Comments)    Dizziness, throat hurts   Pork-Derived Products  Teriflunomide Diarrhea    Hair loss, joint pains, elevated liver enzymes abugio (joint pains and liver issues)   Trazodone And Nefazodone     Mouth sores, tongue swelling   Tape Rash    HOME MEDICATIONS: Outpatient Medications Prior to Visit  Medication Sig Dispense Refill   Ascorbic Acid (VITAMIN C) 1000 MG tablet Take 1,000 mg by mouth daily. (Patient not taking: Reported on 10/28/2022)     aspirin EC 81 MG tablet Take 81 mg by mouth daily.     AZO-CRANBERRY PO Take 1 capsule by mouth daily at 12 noon. (Patient not taking: Reported on 10/28/2022)     baclofen (LIORESAL) 10 MG tablet TAKE 1 TABLET BY MOUTH 3 TIMES DAILY. 270 tablet 3   BLACK ELDERBERRY PO Take 300 mg by mouth in the morning and at bedtime. (Patient not taking: Reported on 10/28/2022)     Cholecalciferol (VITAMIN D) 50 MCG (2000 UT) CAPS Take 2,000 Units by mouth daily.     CINNAMON PO Take 1,200 mg by mouth daily. (Patient not taking: Reported on 10/28/2022)     colestipol (COLESTID) 1 g tablet Take 1 tablet (1 g total) by mouth 2 (two) times daily. 60 tablet 11   dicyclomine (BENTYL) 10 MG capsule Take 1 capsule (10 mg total) by mouth 3 (three) times daily before meals. 90 capsule 11   diphenhydrAMINE (BENADRYL) 25 MG tablet Take 25 mg by mouth 3 (three) times daily. Takes with  Hydrocodone     doxepin (SINEQUAN) 10 MG capsule TAKE 1-2 CAPSULES (10-20 MG TOTAL) BY MOUTH AT BEDTIME. 180 capsule 0   EPINEPHrine 0.3 mg/0.3 mL IJ SOAJ injection Inject 0.3 mg into the muscle as needed for anaphylaxis. 2 each 2   gabapentin (NEURONTIN) 300 MG capsule Take 1 capsule (300 mg total) by mouth 2 (two) times daily. 180 capsule 3   HYDROcodone-acetaminophen (NORCO/VICODIN) 5-325 MG tablet Take 1 tablet by mouth 3 (three) times daily as needed for moderate pain. 270 tablet 0   ipratropium (ATROVENT) 0.06 % nasal spray Use 2 sprays each nostril bid prn drainage 30 mL 5   L-Lysine 1000 MG TABS Take by mouth. As needed (Patient not taking: Reported on 10/28/2022)     Lactobacillus (PROBIOTIC ACIDOPHILUS PO) Take by mouth daily. Digestive Advantage BC   bacillus coagulans (Patient not taking: Reported on 10/28/2022)     levocetirizine (XYZAL) 5 MG tablet TAKE 1 TABLET BY MOUTH EVERY DAY IN THE EVENING 90 tablet 2   Melatonin 10 MG TABS Take 1 tablet by mouth at bedtime.      ondansetron (ZOFRAN-ODT) 4 MG disintegrating tablet Take 1 tablet (4 mg total) by mouth every 8 (eight) hours as needed for nausea or vomiting. 12 tablet 0   rizatriptan (MAXALT-MLT) 10 MG disintegrating tablet Take 1 tablet (10 mg total) by mouth as needed for migraine. May repeat in 2 hours if needed.  Max of 2 tablets in 24 hours, and 10 tablets per month 10 tablet 11   tiZANidine (ZANAFLEX) 2 MG tablet TAKE UP TO 4 TABLETS BY MOUTH EVERY DAY 360 tablet 1   Vitamin D, Ergocalciferol, (DRISDOL) 1.25 MG (50000 UNIT) CAPS capsule ONE CAPSULE TWICE WEEKLY ON TUESDAYS/FRIDAYS 24 capsule 2   vitamin E 1000 UNIT capsule  (Patient not taking: Reported on 10/28/2022)     No facility-administered medications prior to visit.    PAST MEDICAL HISTORY: Past Medical History:  Diagnosis Date   Allergy    Anxiety  Avascular necrosis (HCC)    Back pain    Cancer (HCC)    Cataract    Had surgery   Chest pain    Chewing  difficulty    Chronic fatigue syndrome    Colon polyp    pre-cancerous   Complication of anesthesia    patient states she requires more anesthesia   Depression    Disorder of soft tissue    Diverticulosis    Dizziness    Edema, lower extremity    Fatty liver    Fibromyalgia    GERD (gastroesophageal reflux disease)    Glaucoma 07-2019   From surgery   H/O breast biopsy    pre- cancerous cells   Hand fracture, left    9/87   Heart palpitations    Heart valve disease    History of Holter monitoring    Hyperlipidemia    Insomnia    Joint pain    Lactose intolerance    Lhermitte's syndrome    Migraines    Multiple food allergies    Multiple sclerosis (HCC)    dx in 11/1996   Multiple sclerosis (HCC)    Neuromuscular disorder (HCC)    Neuropathy    Numbness and tingling in hands    Obesity    Optic neuritis    OSA (obstructive sleep apnea)    no longer have OSA since stopped taking a MS medication   Osteoarthritis    Otitis media    Palpitations    Prediabetes    Raynaud's phenomenon    Shortness of breath    Sleep apnea    Due to medication that she no longer takes.   Swallowing difficulty    Vaginitis and vulvovaginitis    Vitamin B12 deficiency    Vitamin D deficiency     PAST SURGICAL HISTORY: Past Surgical History:  Procedure Laterality Date   ABDOMINAL HYSTERECTOMY Bilateral 02/12/2016   Procedure: HYSTERECTOMY ABDOMINAL WITH BILATERAL SALPINGO OOPHERECTOMY;  Surgeon: Marcelle Overlie, MD;  Location: [facility];  Service: Gynecology;  Laterality: Bilateral;   BREAST EXCISIONAL BIOPSY     core/left breast   CATARACT EXTRACTION, BILATERAL  06/2019   CHOLECYSTECTOMY  1983   COLONOSCOPY     1997/2001   DILATION AND CURETTAGE OF UTERUS     ENDOMETRIAL ABLATION  2012   EXTRACORPOREAL SHOCK WAVE LITHOTRIPSY Left 05/24/2022   Procedure: LEFT EXTRACORPOREAL SHOCK WAVE LITHOTRIPSY (ESWL);  Surgeon: Crist Fat, MD;  Location: [facility];   Service: Urology;  Laterality: Left;   EYE SURGERY     JOINT REPLACEMENT     LAPAROSCOPIC ABDOMINAL EXPLORATION     ovaries and intestines bond together   LEFT HEART CATHETERIZATION WITH CORONARY ANGIOGRAM N/A 06/21/2011   Procedure: LEFT HEART CATHETERIZATION WITH CORONARY ANGIOGRAM;  Surgeon: Marykay Lex, MD;  Location: [facility];  Service: Cardiovascular;  Laterality: N/A;   LUMBAR PUNCTURE     11/01/1996   SALPINGOOPHORECTOMY Bilateral 02/12/2016   Procedure: BILATERAL SALPINGO OOPHORECTOMY;  Surgeon: Marcelle Overlie, MD;  Location: [facility];  Service: Gynecology;  Laterality: Bilateral;   TOTAL HIP ARTHROPLASTY     left hip  6/02  /right hip12/04    FAMILY HISTORY: Family History  Problem Relation Age of Onset   Pancreatic cancer Mother    Stroke Mother    Hypertension Mother    Hyperlipidemia Mother    Heart disease Mother    Cancer Mother    Depression Mother  Anxiety disorder Mother    Alcoholism Mother    Alcohol abuse Mother    Arthritis Mother    Testicular cancer Brother    Cancer Brother    Colon cancer Maternal Uncle    Pancreatic cancer Paternal Uncle    Prostate cancer Maternal Grandfather    Colon polyps Father    Diabetes Father    Hypertension Father    Hyperlipidemia Father    Kidney disease Father    Sleep apnea Father    Obesity Father    Arthritis Father    Asthma Father    COPD Father    Heart disease Father    Asthma Paternal Grandfather    COPD Paternal Grandfather    Heart disease Paternal Grandfather    Cancer Paternal Grandmother    ADD / ADHD Son    Anxiety disorder Son    Hypertension Son    Cancer Maternal Uncle    Cancer Paternal Uncle    Hypertension Daughter     SOCIAL HISTORY: Social History   Socioeconomic History   Marital status: Widowed    Spouse name: Not on file   Number of children: Not on file   Years of education: Not on file   Highest education level: 12th grade  Occupational History   Occupation:  disabled  Tobacco Use   Smoking status: Former    Current packs/day: 0.00    Average packs/day: 0.3 packs/day for 3.0 years (0.8 ttl pk-yrs)    Types: Cigarettes    Start date: 06/30/1992    Quit date: 07/01/1995    Years since quitting: 27.4   Smokeless tobacco: Never  Vaping Use   Vaping status: Never Used  Substance and Sexual Activity   Alcohol use: No   Drug use: Yes    Types: Hydrocodone   Sexual activity: Not Currently    Birth control/protection: Abstinence, Post-menopausal, Surgical  Other Topics Concern   Not on file  Social History Narrative   Not on file   Social Determinants of Health   Financial Resource Strain: Low Risk  (10/27/2022)   Overall Financial Resource Strain (CARDIA)    Difficulty of Paying Living Expenses: Not hard at all  Food Insecurity: No Food Insecurity (10/27/2022)   Hunger Vital Sign    Worried About Running Out of Food in the Last Year: Never true    Ran Out of Food in the Last Year: Never true  Recent Concern: Food Insecurity - Food Insecurity Present (10/18/2022)   Hunger Vital Sign    Worried About Running Out of Food in the Last Year: Sometimes true    Ran Out of Food in the Last Year: Sometimes true  Transportation Needs: No Transportation Needs (10/27/2022)   PRAPARE - Administrator, Civil Service (Medical): No    Lack of Transportation (Non-Medical): No  Physical Activity: Insufficiently Active (10/27/2022)   Exercise Vital Sign    Days of Exercise per Week: 2 days    Minutes of Exercise per Session: 60 min  Stress: No Stress Concern Present (10/27/2022)   [employer] of Occupational Health - Occupational Stress Questionnaire    Feeling of Stress : Only a little  Social Connections: Moderately Integrated (10/27/2022)   Social Connection and Isolation Panel [NHANES]    Frequency of Communication with Friends and Family: Three times a week    Frequency of Social Gatherings with Friends and Family: Once a week     Attends Religious Services: 1 to 4 times  per year    Active Member of Clubs or Organizations: Yes    Attends Banker Meetings: Not on file    Marital Status: Widowed  Recent Concern: Social Connections - Moderately Isolated (10/18/2022)   Social Connection and Isolation Panel [NHANES]    Frequency of Communication with Friends and Family: More than three times a week    Frequency of Social Gatherings with Friends and Family: Twice a week    Attends Religious Services: 1 to 4 times per year    Active Member of [employer] or Organizations: No    Attends Banker Meetings: Not on file    Marital Status: Widowed  Intimate Partner Violence: Not At Risk (10/28/2022)   Humiliation, Afraid, Rape, and Kick questionnaire    Fear of Current or Ex-Partner: No    Emotionally Abused: No    Physically Abused: No    Sexually Abused: No      PHYSICAL EXAM  There were no vitals filed for this visit.    There is no height or weight on file to calculate BMI.  Generalized: Well developed, in no acute distress  Cardiology: normal rate and rhythm, no murmur noted Neurological examination  Mentation: Alert oriented to time, place, history taking. Follows all commands speech and language fluent Cranial nerve II-XII: Pupils were equal round reactive to light. Extraocular movements were full, visual field were full on confrontational test. Facial sensation and strength were normal. Head turning and shoulder shrug  were normal and symmetric. Motor: The motor testing reveals 5 over 5 strength of all 4 extremities. Good symmetric motor tone is noted throughout.  Sensory: Sensory testing is intact to soft touch on all 4 extremities. No evidence of extinction is noted.  Coordination: Cerebellar testing reveals good finger-nose-finger and heel-to-shin not performed due to being in boot.  Gait and station: Gait is normal.    DIAGNOSTIC DATA (LABS, IMAGING, TESTING) - I reviewed patient  records, labs, notes, testing and imaging myself where available.      No data to display           Lab Results  Component Value Date   WBC 11.3 (H) 05/20/2022   HGB 14.0 05/20/2022   HCT 41.7 05/20/2022   MCV 95.9 05/20/2022   PLT 271 05/20/2022      Component Value Date/Time   NA 139 10/19/2022 1045   K 4.5 10/19/2022 1045   CL 105 10/19/2022 1045   CO2 21 10/19/2022 1045   GLUCOSE 113 (H) 10/19/2022 1045   GLUCOSE 103 (H) 05/20/2022 1612   BUN 10 10/19/2022 1045   CREATININE 0.67 10/19/2022 1045   CALCIUM 9.3 10/19/2022 1045   PROT 7.3 08/23/2022 1023   PROT 7.0 04/29/2022 1541   ALBUMIN 4.3 08/23/2022 1023   ALBUMIN 4.5 04/29/2022 1541   AST 87 (H) 08/23/2022 1023   ALT 78 (H) 08/23/2022 1023   ALKPHOS 126 (H) 08/23/2022 1023   BILITOT 0.4 08/23/2022 1023   BILITOT 0.8 04/29/2022 1541   GFRNONAA 57 (L) 05/20/2022 1612   GFRAA 110 04/16/2020 1635   Lab Results  Component Value Date   CHOL 154 10/19/2022   HDL 57 10/19/2022   LDLCALC 71 10/19/2022   TRIG 154 (H) 10/19/2022   CHOLHDL 2.7 10/19/2022   Lab Results  Component Value Date   HGBA1C 6.1 (H) 10/19/2022   Lab Results  Component Value Date   VITAMINB12 663 10/21/2021   Lab Results  Component Value Date  TSH 1.530 05/20/2021     ASSESSMENT AND PLAN 62 y.o. year old female  has a past medical history of Allergy, Anxiety, Avascular necrosis (HCC), Back pain, Cancer (HCC), Cataract, Chest pain, Chewing difficulty, Chronic fatigue syndrome, Colon polyp, Complication of anesthesia, Depression, Disorder of soft tissue, Diverticulosis, Dizziness, Edema, lower extremity, Fatty liver, Fibromyalgia, GERD (gastroesophageal reflux disease), Glaucoma (07-2019), H/O breast biopsy, Hand fracture, left, Heart palpitations, Heart valve disease, History of Holter monitoring, Hyperlipidemia, Insomnia, Joint pain, Lactose intolerance, Lhermitte's syndrome, Migraines, Multiple food allergies, Multiple sclerosis (HCC),  Multiple sclerosis (HCC), Neuromuscular disorder (HCC), Neuropathy, Numbness and tingling in hands, Obesity, Optic neuritis, OSA (obstructive sleep apnea), Osteoarthritis, Otitis media, Palpitations, Prediabetes, Raynaud's phenomenon, Shortness of breath, Sleep apnea, Swallowing difficulty, Vaginitis and vulvovaginitis, Vitamin B12 deficiency, and Vitamin D deficiency. here with   No diagnosis found.   Ariyanna has been doing well until new onset of left sided occipital dysesthesia about 2 weeks ago. Neuro exam intact. MRI stable 05/2021. We have discussed repeating MRI for eval, however, she is adamant that she is not interested in starting a DMT if she is experiencing a new lesion. I will have her take a prednisone taper and increase gabapentin to BID for the next 1-2 weeks. Zofran as needed for nausea. If symptoms worsen or new symptoms present, she will seek emergent evaluation. She will continue medicaitons as prescribed. She denies need for refills at this time. PDMP shows appropriate refills and no other controlled substances being prescribed. She was encouraged to continue regular activity and stay well hydrated. She will follow up in 6 months with [facility], sooner if needed.    No orders of the defined types were placed in this encounter.     No orders of the defined types were placed in this encounter.      Shawnie Dapper, FNP-C 11/22/2022, 4:38 PM [facility] [address], [address] [place], [place] [address] [phone]

## 2022-11-23 ENCOUNTER — Ambulatory Visit (INDEPENDENT_AMBULATORY_CARE_PROVIDER_SITE_OTHER): Payer: Medicare Other | Admitting: Family Medicine

## 2022-11-23 ENCOUNTER — Encounter: Payer: Self-pay | Admitting: Family Medicine

## 2022-11-23 VITALS — BP 129/97 | HR 83 | Ht 66.0 in | Wt 217.5 lb

## 2022-11-23 DIAGNOSIS — M542 Cervicalgia: Secondary | ICD-10-CM | POA: Diagnosis not present

## 2022-11-23 DIAGNOSIS — Z79891 Long term (current) use of opiate analgesic: Secondary | ICD-10-CM

## 2022-11-23 DIAGNOSIS — R208 Other disturbances of skin sensation: Secondary | ICD-10-CM

## 2022-11-23 DIAGNOSIS — R5383 Other fatigue: Secondary | ICD-10-CM | POA: Diagnosis not present

## 2022-11-23 DIAGNOSIS — G4700 Insomnia, unspecified: Secondary | ICD-10-CM

## 2022-11-23 DIAGNOSIS — G35 Multiple sclerosis: Secondary | ICD-10-CM

## 2022-11-23 MED ORDER — PREDNISONE 10 MG (21) PO TBPK: ORAL_TABLET | ORAL | 0 refills | Status: DC

## 2022-11-23 MED ORDER — RIZATRIPTAN BENZOATE 10 MG PO TBDP: 10.0000 mg | ORAL_TABLET | ORAL | 11 refills | Status: DC | PRN

## 2022-11-24 ENCOUNTER — Other Ambulatory Visit (HOSPITAL_COMMUNITY): Payer: Self-pay

## 2022-12-07 ENCOUNTER — Other Ambulatory Visit: Payer: Medicare Other

## 2022-12-25 ENCOUNTER — Ambulatory Visit
Admission: RE | Admit: 2022-12-25 | Discharge: 2022-12-25 | Disposition: A | Discharge status: Home / Self Care | Payer: Medicare Other | Source: Ambulatory Visit | Attending: Neurology | Admitting: Neurology

## 2022-12-25 DIAGNOSIS — R519 Headache, unspecified: Secondary | ICD-10-CM

## 2022-12-25 DIAGNOSIS — M542 Cervicalgia: Secondary | ICD-10-CM | POA: Diagnosis not present

## 2022-12-25 DIAGNOSIS — G35 Multiple sclerosis: Secondary | ICD-10-CM

## 2022-12-25 MED ORDER — GADOPICLENOL 0.5 MMOL/ML IV SOLN
10.0000 mL | Freq: Once | INTRAVENOUS | Status: AC | PRN
  Administered 2022-12-25: 10 mL via INTRAVENOUS

## 2022-12-26 ENCOUNTER — Encounter: Payer: Self-pay | Admitting: Neurology

## 2022-12-27 ENCOUNTER — Encounter: Payer: Self-pay | Admitting: Neurology

## 2022-12-27 ENCOUNTER — Other Ambulatory Visit: Payer: Self-pay | Admitting: Neurology

## 2022-12-28 ENCOUNTER — Other Ambulatory Visit: Payer: Self-pay | Admitting: Neurology

## 2022-12-29 MED ORDER — HYDROCODONE-ACETAMINOPHEN 5-325 MG PO TABS: 1.0000 | ORAL_TABLET | Freq: Three times a day (TID) | ORAL | 0 refills | Status: DC | PRN

## 2022-12-29 NOTE — Telephone Encounter (Signed)
Last seen on 11/23/22 Follow up scheduled on 03/30/23 Last filled on 08/30/22 #270 tablets (90 day supply) Rx pending to be signed

## 2023-01-07 ENCOUNTER — Other Ambulatory Visit: Payer: Self-pay | Admitting: Medical Genetics

## 2023-01-07 DIAGNOSIS — Z006 Encounter for examination for normal comparison and control in clinical research program: Secondary | ICD-10-CM

## 2023-01-10 ENCOUNTER — Ambulatory Visit
Admission: EM | Admit: 2023-01-10 | Discharge: 2023-01-10 | Disposition: A | Discharge status: Home / Self Care | Payer: Medicare Other

## 2023-01-10 ENCOUNTER — Emergency Department (HOSPITAL_BASED_OUTPATIENT_CLINIC_OR_DEPARTMENT_OTHER)
Admission: EM | Admit: 2023-01-10 | Discharge: 2023-01-10 | Disposition: A | Discharge status: Home / Self Care | Payer: Medicare Other | Attending: Emergency Medicine | Admitting: Emergency Medicine

## 2023-01-10 ENCOUNTER — Other Ambulatory Visit: Payer: Self-pay

## 2023-01-10 ENCOUNTER — Emergency Department (HOSPITAL_BASED_OUTPATIENT_CLINIC_OR_DEPARTMENT_OTHER): Payer: Medicare Other | Admitting: Radiology

## 2023-01-10 ENCOUNTER — Encounter (HOSPITAL_BASED_OUTPATIENT_CLINIC_OR_DEPARTMENT_OTHER): Payer: Self-pay | Admitting: Emergency Medicine

## 2023-01-10 DIAGNOSIS — S91114A Laceration without foreign body of right lesser toe(s) without damage to nail, initial encounter: Secondary | ICD-10-CM

## 2023-01-10 DIAGNOSIS — S99921A Unspecified injury of right foot, initial encounter: Secondary | ICD-10-CM | POA: Diagnosis present

## 2023-01-10 DIAGNOSIS — W1840XA Slipping, tripping and stumbling without falling, unspecified, initial encounter: Secondary | ICD-10-CM | POA: Insufficient documentation

## 2023-01-10 MED ORDER — LIDOCAINE-EPINEPHRINE-TETRACAINE (LET) TOPICAL GEL
3.0000 mL | Freq: Once | TOPICAL | Status: AC
  Administered 2023-01-10: 3 mL via TOPICAL
  Filled 2023-01-10: qty 3

## 2023-01-10 MED ORDER — CEPHALEXIN 500 MG PO CAPS: 500.0000 mg | ORAL_CAPSULE | Freq: Two times a day (BID) | ORAL | 0 refills | Status: AC

## 2023-01-10 MED ORDER — OXYCODONE-ACETAMINOPHEN 5-325 MG PO TABS
1.0000 | ORAL_TABLET | Freq: Once | ORAL | Status: AC
  Administered 2023-01-10: 1 via ORAL
  Filled 2023-01-10: qty 1

## 2023-01-10 MED ORDER — CEPHALEXIN 250 MG PO CAPS
500.0000 mg | ORAL_CAPSULE | Freq: Once | ORAL | Status: AC
  Administered 2023-01-10: 500 mg via ORAL
  Filled 2023-01-10: qty 2

## 2023-01-10 MED ORDER — LIDOCAINE HCL (PF) 1 % IJ SOLN
19.0000 mL | Freq: Once | INTRAMUSCULAR | Status: AC
  Administered 2023-01-10: 19 mL
  Filled 2023-01-10: qty 20

## 2023-01-10 NOTE — ED Triage Notes (Signed)
Pt reports tripping over the dog kennel today. She reports R-pinky toe injury.

## 2023-01-10 NOTE — ED Notes (Signed)
Lesser toe of right foot wrapped with non-stick pad and coban.

## 2023-01-10 NOTE — Discharge Instructions (Signed)
Please go to the emergency department as soon as you leave urgent care for further evaluation and management. ?

## 2023-01-10 NOTE — Discharge Instructions (Addendum)
Keep your wound dry for the next 48 hours.  Your stitches will need to be removed in 7 to 10 days.  This can be done at your doctor's office or in urgent care clinic.

## 2023-01-10 NOTE — ED Provider Notes (Signed)
[facility]    CSN: [id number] Arrival date & time: 01/10/23  0855      History   Chief Complaint Chief Complaint  Patient presents with   Toe Injury    HPI Grace [name] is a 62 y.o. female.   Patient presents today with right fifth toe injury that occurred around 7:30 AM this morning.  Patient states that she caught her toe on her dog kennel causing it to extend outwards.  She has a laceration to the bottom of the toe.  Last tetanus vaccine was 3 years ago.  She states that she attempted to clean it but was not successful. Takes aspirin but no other blood thinning medications.      Past Medical History:  Diagnosis Date   Allergy    Anxiety    Avascular necrosis (HCC)    Back pain    Cancer (HCC)    Cataract    Had surgery   Chest pain    Chewing difficulty    Chronic fatigue syndrome    Colon polyp    pre-cancerous   Complication of anesthesia    patient states she requires more anesthesia   Depression    Disorder of soft tissue    Diverticulosis    Dizziness    Edema, lower extremity    Fatty liver    Fibromyalgia    GERD (gastroesophageal reflux disease)    Glaucoma 07-2019   From surgery   H/O breast biopsy    pre- cancerous cells   Hand fracture, left    9/87   Heart palpitations    Heart valve disease    History of Holter monitoring    Hyperlipidemia    Insomnia    Joint pain    Lactose intolerance    Lhermitte's syndrome    Migraines    Multiple food allergies    Multiple sclerosis (HCC)    dx in 11/1996   Multiple sclerosis (HCC)    Neuromuscular disorder (HCC)    Neuropathy    Numbness and tingling in hands    Obesity    Optic neuritis    OSA (obstructive sleep apnea)    no longer have OSA since stopped taking a MS medication   Osteoarthritis    Otitis media    Palpitations    Prediabetes    Raynaud's phenomenon    Shortness of breath    Sleep apnea    Due to medication that she no longer takes.   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   Chronic prescription opiate use 12/17/2020   Numbness 12/17/2020   Arthritis 11/26/2020   Irritable bowel syndrome 11/26/2020   At risk for heart disease 04/15/2020   Mixed hyperlipidemia 04/15/2020   Cystoid macular edema of right eye 03/25/2020   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   Secondary open-angle glaucoma of right eye, 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   Prediabetes 01/01/2020   Vitamin D deficiency disease 01/01/2020   Vitamin B 12 deficiency 01/01/2020   At risk for diabetes mellitus 01/01/2020   Class 2 severe obesity due to excess calories with serious comorbidity and body mass index (BMI) of 35.0 to  35.9 in adult [facility]) 01/01/2020   Anosmia 11/15/2019   Disturbance of smell and taste 11/15/2019   Epiretinal membrane, right 07/03/2019   Depression 03/06/2019   Diverticulosis 06/14/2018   Hepatic steatosis 06/14/2018   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   Decreased pedal pulses 06/30/2016   Pain in both lower extremities 06/30/2016   Facial pain, atypical 06/08/2016   Fibroids 02/12/2016   Chest pain 07/07/2015   Dizziness and giddiness 07/07/2015   Leiomyoma of uterus 07/07/2015   Lower abdominal pain 07/07/2015   Hip pain 03/03/2015   Optic neuritis 06/27/2014   Multiple sclerosis (HCC) 06/26/2014   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/26/2014   Other fatigue 06/26/2014   Dysesthesia 06/26/2014   Depression with anxiety 06/26/2014   Insomnia 06/26/2014   Low serum vitamin D 06/26/2014    Past Surgical History:  Procedure Laterality Date   ABDOMINAL HYSTERECTOMY Bilateral 02/12/2016   Procedure: HYSTERECTOMY ABDOMINAL WITH  BILATERAL SALPINGO OOPHERECTOMY;  Surgeon: Marcelle Overlie, MD;  Location: [facility];  Service: Gynecology;  Laterality: Bilateral;   BREAST EXCISIONAL BIOPSY     core/left breast   CATARACT EXTRACTION, BILATERAL  06/2019   CHOLECYSTECTOMY  1983   COLONOSCOPY     1997/2001   DILATION AND CURETTAGE OF UTERUS     ENDOMETRIAL ABLATION  2012   EXTRACORPOREAL SHOCK WAVE LITHOTRIPSY Left 05/24/2022   Procedure: LEFT EXTRACORPOREAL SHOCK WAVE LITHOTRIPSY (ESWL);  Surgeon: Crist Fat, MD;  Location: [facility];  Service: Urology;  Laterality: Left;   EYE SURGERY     JOINT REPLACEMENT     LAPAROSCOPIC ABDOMINAL EXPLORATION     ovaries and intestines bond together   LEFT HEART CATHETERIZATION WITH CORONARY ANGIOGRAM N/A 06/21/2011   Procedure: LEFT HEART CATHETERIZATION WITH CORONARY ANGIOGRAM;  Surgeon: Marykay Lex, MD;  Location: [facility] CATH LAB;  Service: Cardiovascular;  Laterality: N/A;   LUMBAR PUNCTURE     11/01/1996   SALPINGOOPHORECTOMY Bilateral 02/12/2016   Procedure: BILATERAL SALPINGO OOPHORECTOMY;  Surgeon: Marcelle Overlie, MD;  Location: [facility];  Service: Gynecology;  Laterality: Bilateral;   TOTAL HIP ARTHROPLASTY     left hip  6/02  /right hip12/04    OB History     Gravida  2   Para  2   Term      Preterm      AB      Living         SAB      IAB      Ectopic      Multiple      Live Births               Home Medications    Prior to Admission medications   Medication Sig Start Date End Date Taking? Authorizing Provider  aspirin EC 81 MG tablet Take 81 mg by mouth daily.   Yes [provider]  baclofen (LIORESAL) 10 MG tablet TAKE 1 TABLET BY MOUTH 3 TIMES DAILY. 07/13/22  Yes Sater, Pearletha Furl, MD  Cholecalciferol (VITAMIN D) 50 MCG (2000 UT) CAPS Take 2,000 Units by mouth daily.   Yes [provider]  colestipol (COLESTID) 1 g tablet Take 1 tablet (1 g total) by mouth 2 (two) times daily. 08/26/22  Yes Meryl Dare, MD  dicyclomine (BENTYL) 10 MG capsule Take 1 capsule (10 mg total) by mouth 3 (three) times daily before meals. 08/26/22  Yes Meryl Dare, MD  doxepin (SINEQUAN) 10 MG capsule TAKE 1-2 CAPSULES (10-20 MG  TOTAL) BY MOUTH AT BEDTIME. 10/18/22  Yes Sater, Pearletha Furl, MD  gabapentin (NEURONTIN) 300 MG capsule Take 1 capsule (300 mg total) by mouth 2 (two) times daily. 07/13/22  Yes Sater, Pearletha Furl, MD  HYDROcodone-acetaminophen (NORCO/VICODIN) 5-325 MG tablet Take 1 tablet by mouth 3 (three) times daily as needed for moderate pain. 12/29/22  Yes Sater, Pearletha Furl, MD  ipratropium (ATROVENT) 0.06 % nasal spray Use 2 sprays each nostril bid prn drainage 10/25/22  Yes [name], MD  levocetirizine (XYZAL) 5 MG tablet TAKE 1 TABLET BY MOUTH EVERY DAY IN THE EVENING 04/07/22  Yes [name], MD  Melatonin 10 MG TABS Take 1 tablet by mouth at bedtime.    Yes [provider]  predniSONE (STERAPRED UNI-PAK 21 TAB) 10 MG (21) TBPK tablet Take as directed 11/23/22  Yes Lomax, Amy, NP  rizatriptan (MAXALT-MLT) 10 MG disintegrating tablet Take 1 tablet (10 mg total) by mouth as needed for migraine. May repeat in 2 hours if needed.  Max of 2 tablets in 24 hours, and 10 tablets per month 11/23/22  Yes Lomax, Amy, NP  tiZANidine (ZANAFLEX) 2 MG tablet TAKE UP TO 4 TABLETS BY MOUTH EVERY DAY 07/13/22  Yes Sater, Pearletha Furl, MD  Vitamin D, Ergocalciferol, (DRISDOL) 1.25 MG (50000 UNIT) CAPS capsule ONE CAPSULE TWICE WEEKLY ON TUESDAYS/FRIDAYS 03/31/22  Yes [name], MD  diphenhydrAMINE (BENADRYL) 25 MG tablet Take 25 mg by mouth 3 (three) times daily. Takes with Hydrocodone    [provider]  ondansetron (ZOFRAN-ODT) 4 MG disintegrating tablet Take 1 tablet (4 mg total) by mouth every 8 (eight) hours as needed for nausea or vomiting. 05/20/22   Jeannie Fend, PA-C    Family History Family History  Problem Relation Age of Onset   Pancreatic cancer Mother    Stroke Mother     Hypertension Mother    Hyperlipidemia Mother    Heart disease Mother    Cancer Mother    Depression Mother    Anxiety disorder Mother    Alcoholism Mother    Alcohol abuse Mother    Arthritis Mother    Testicular cancer Brother    Cancer Brother    Colon cancer Maternal Uncle    Pancreatic cancer Paternal Uncle    Prostate cancer Maternal Grandfather    Colon polyps Father    Diabetes Father    Hypertension Father    Hyperlipidemia Father    Kidney disease Father    Sleep apnea Father    Obesity Father    Arthritis Father    Asthma Father    COPD Father    Heart disease Father    Asthma Paternal Grandfather    COPD Paternal Grandfather    Heart disease Paternal Grandfather    Cancer Paternal Grandmother    ADD / ADHD Son    Anxiety disorder Son    Hypertension Son    Cancer Maternal Uncle    Cancer Paternal Uncle    Hypertension Daughter     Social History Social History   Tobacco Use   Smoking status: Former    Current packs/day: 0.00    Average packs/day: 0.3 packs/day for 3.0 years (0.8 ttl pk-yrs)    Types: Cigarettes    Start date: 06/30/1992    Quit date: 07/01/1995    Years since quitting: 27.5   Smokeless tobacco: Never  Vaping Use   Vaping status: Never Used  Substance Use Topics   Alcohol use: No  Drug use: Yes    Types: Hydrocodone     Allergies   Diclofenac, Lamictal [lamotrigine], Mobic [meloxicam], Ultram [tramadol hcl], Nefazodone, Banana, Beef-derived products, Betaseron [interferon beta-1b], Codeine, Copaxone [glatiramer acetate], Flomax [tamsulosin hcl], Gabapentin, Latex, Nitrofurantoin, Oxcarbazepine, Pork-derived products, Teriflunomide, Trazodone and nefazodone, and Tape   Review of Systems Review of Systems Per HPI  Physical Exam Triage Vital Signs ED Triage Vitals [01/10/23 0908]  Encounter Vitals Group     BP 138/85     Systolic BP Percentile      Diastolic BP Percentile      Pulse Rate 82     Resp 16     Temp 98.1 F  (36.7 C)     Temp Source Oral     SpO2 96 %     Weight      Height      Head Circumference      Peak Flow      Pain Score      Pain Loc      Pain Education      Exclude from Growth Chart    No data found.  Updated Vital Signs BP 138/85 (BP Location: Left Arm)   Pulse 82   Temp 98.1 F (36.7 C) (Oral)   Resp 16   LMP 04/09/2013   SpO2 96%   Visual Acuity Right Eye Distance:   Left Eye Distance:   Bilateral Distance:    Right Eye Near:   Left Eye Near:    Bilateral Near:     Physical Exam Constitutional:      General: She is not in acute distress.    Appearance: Normal appearance. She is not toxic-appearing or diaphoretic.  HENT:     Head: Normocephalic and atraumatic.  Eyes:     Extraocular Movements: Extraocular movements intact.     Conjunctiva/sclera: Conjunctivae normal.  Pulmonary:     Effort: Pulmonary effort is normal.  Feet:     Comments: Patient has a deep laceration to the underside of the right fifth toe at the PIP joint.  No bleeding noted.  Patient can wiggle toes.  Tenderness to palpation present.  Capillary refill and pulses are intact. Neurological:     General: No focal deficit present.     Mental Status: She is alert and oriented to person, place, and time. Mental status is at baseline.  Psychiatric:        Mood and Affect: Mood normal.        Behavior: Behavior normal.        Thought Content: Thought content normal.        Judgment: Judgment normal.      UC Treatments / Results  Labs (all labs ordered are listed, but only abnormal results are displayed) Labs Reviewed - No data to display  EKG   Radiology No results found.  Procedures Procedures (including critical care time)  Medications Ordered in UC Medications - No data to display  Initial Impression / Assessment and Plan / UC Course  I have reviewed the triage vital signs and the nursing notes.  Pertinent labs & imaging results that were available during my care of  the patient were reviewed by me and considered in my medical decision making (see chart for details).     Patient has a deep laceration to the right fifth toe with associated injury so recommended ER evaluation for closure.  Patient was agreeable with this plan.  Vital signs stable at discharge.  Tetanus vaccine already  up-to-date per patient report.  Dressing applied by clinical staff prior to discharge.  Patient verbalized understanding and was agreeable with plan.  Agree with patient self transport to the ER. Final Clinical Impressions(s) / UC Diagnoses   Final diagnoses:  Injury of toe on right foot, initial encounter  Laceration of lesser toe of right foot without foreign body present or damage to nail, initial encounter     Discharge Instructions      Please go to the emergency department as soon as you leave urgent care for further evaluation and management.    ED Prescriptions   None    PDMP not reviewed this encounter.   Gustavus Bryant, [place] 01/10/23 [phone]

## 2023-01-10 NOTE — ED Provider Notes (Signed)
[place] EMERGENCY DEPARTMENT AT [facility] Provider Note   CSN: [id number] Arrival date & time: 01/10/23  1030     History  Chief Complaint  Patient presents with   Toe Injury    Grace [name] is a 62 y.o. female presented to ED with an injury to her right toe, reports it was caught in the dark and was chasing her dog.  She has a laceration under her right toe.  She went to urgent care and was told to come to the ED for further evaluation.  Her tetanus is up-to-date.  She is not on anticoagulation.  HPI     Home Medications Prior to Admission medications   Medication Sig Start Date End Date Taking? Authorizing Provider  cephALEXin (KEFLEX) 500 MG capsule Take 1 capsule (500 mg total) by mouth 2 (two) times daily for 5 days. 01/10/23 01/15/23 Yes Terald Sleeper, MD  aspirin EC 81 MG tablet Take 81 mg by mouth daily.    [provider]  baclofen (LIORESAL) 10 MG tablet TAKE 1 TABLET BY MOUTH 3 TIMES DAILY. 07/13/22   Sater, Pearletha Furl, MD  Cholecalciferol (VITAMIN D) 50 MCG (2000 UT) CAPS Take 2,000 Units by mouth daily.    [provider]  colestipol (COLESTID) 1 g tablet Take 1 tablet (1 g total) by mouth 2 (two) times daily. 08/26/22   Meryl Dare, MD  dicyclomine (BENTYL) 10 MG capsule Take 1 capsule (10 mg total) by mouth 3 (three) times daily before meals. 08/26/22   Meryl Dare, MD  diphenhydrAMINE (BENADRYL) 25 MG tablet Take 25 mg by mouth 3 (three) times daily. Takes with Hydrocodone    [provider]  doxepin (SINEQUAN) 10 MG capsule TAKE 1-2 CAPSULES (10-20 MG TOTAL) BY MOUTH AT BEDTIME. 10/18/22   Sater, Pearletha Furl, MD  gabapentin (NEURONTIN) 300 MG capsule Take 1 capsule (300 mg total) by mouth 2 (two) times daily. 07/13/22   Sater, Pearletha Furl, MD  HYDROcodone-acetaminophen (NORCO/VICODIN) 5-325 MG tablet Take 1 tablet by mouth 3 (three) times daily as needed for moderate pain. 12/29/22   Sater, Pearletha Furl, MD  ipratropium (ATROVENT)  0.06 % nasal spray Use 2 sprays each nostril bid prn drainage 10/25/22   [name], MD  levocetirizine (XYZAL) 5 MG tablet TAKE 1 TABLET BY MOUTH EVERY DAY IN THE EVENING 04/07/22   [name], MD  Melatonin 10 MG TABS Take 1 tablet by mouth at bedtime.     [provider]  ondansetron (ZOFRAN-ODT) 4 MG disintegrating tablet Take 1 tablet (4 mg total) by mouth every 8 (eight) hours as needed for nausea or vomiting. 05/20/22   Jeannie Fend, PA-C  predniSONE (STERAPRED UNI-PAK 21 TAB) 10 MG (21) TBPK tablet Take as directed 11/23/22   Lomax, Amy, NP  rizatriptan (MAXALT-MLT) 10 MG disintegrating tablet Take 1 tablet (10 mg total) by mouth as needed for migraine. May repeat in 2 hours if needed.  Max of 2 tablets in 24 hours, and 10 tablets per month 11/23/22   Lomax, Amy, NP  tiZANidine (ZANAFLEX) 2 MG tablet TAKE UP TO 4 TABLETS BY MOUTH EVERY DAY 07/13/22   Sater, Pearletha Furl, MD  Vitamin D, Ergocalciferol, (DRISDOL) 1.25 MG (50000 UNIT) CAPS capsule ONE CAPSULE TWICE WEEKLY ON TUESDAYS/FRIDAYS 03/31/22   [name], MD      Allergies    Diclofenac, Lamictal [lamotrigine], Mobic [meloxicam], Ultram [tramadol hcl], Nefazodone, Banana, Beef-derived products, Betaseron [interferon beta-1b], Codeine, Copaxone [employer] consultant  acetate], Flomax [tamsulosin hcl], Gabapentin, Latex, Nitrofurantoin, Oxcarbazepine, Pork-derived products, Teriflunomide, Trazodone and nefazodone, and Tape    Review of Systems   Review of Systems  Physical Exam Updated Vital Signs BP (!) 168/106 (BP Location: Right Arm)   Pulse 82   Temp 98.1 F (36.7 C) (Temporal)   Resp 14   Ht 5\' 5"  (1.651 m)   Wt 99.8 kg   LMP 04/09/2013   SpO2 99%   BMI 36.61 kg/m  Physical Exam Constitutional:      General: She is not in acute distress. HENT:     Head: Normocephalic and atraumatic.  Eyes:     Conjunctiva/sclera: Conjunctivae normal.     Pupils: Pupils are equal, round, and reactive to light.  Cardiovascular:      Rate and Rhythm: Normal rate and regular rhythm.  Pulmonary:     Effort: Pulmonary effort is normal. No respiratory distress.  Skin:    General: Skin is warm and dry.     Comments: Laceration underlying the right fifth toe, no active bleeding, toe has normal coloration  Neurological:     General: No focal deficit present.     Mental Status: She is alert. Mental status is at baseline.  Psychiatric:        Mood and Affect: Mood normal.        Behavior: Behavior normal.     ED Results / Procedures / Treatments   Labs (all labs ordered are listed, but only abnormal results are displayed) Labs Reviewed - No data to display  EKG None  Radiology DG Foot Complete Right  Result Date: 01/10/2023 CLINICAL DATA:  5th toe injury with pain EXAM: RIGHT FOOT COMPLETE - 3+ VIEW COMPARISON:  None Available. FINDINGS: Tiny Achilles spur. No acute fracture or dislocation. No significant soft tissue swelling. IMPRESSION: Negative. Electronically Signed   By: Jeronimo Greaves M.D.   On: 01/10/2023 11:08    Procedures .[name] KitchenLaceration Repair  Date/Time: 01/10/2023 1:42 PM  Performed by: Terald Sleeper, MD Authorized by: Terald Sleeper, MD   Consent:    Consent obtained:  Verbal   Consent given by:  Patient   Risks discussed:  Pain, poor cosmetic result, poor wound healing and infection Universal protocol:    Procedure explained and questions answered to patient or proxy's satisfaction: yes     Site/side marked: yes     Immediately prior to procedure, a time out was called: yes     Patient identity confirmed:  Arm band Anesthesia:    Anesthesia method:  Topical application and local infiltration   Topical anesthetic:  LET   Local anesthetic:  Lidocaine 1% w/o epi Laceration details:    Location:  Toe   Toe location:  R little toe   Length (cm):  2 Exploration:    Imaging obtained: x-ray     Imaging outcome: foreign body not noted     Contaminated: no   Treatment:    Area cleansed  with:  Saline Skin repair:    Repair method:  Sutures   Suture size:  4-0   Suture material:  Prolene   Suture technique:  Simple interrupted   Number of sutures:  4 Approximation:    Approximation:  Close Repair type:    Repair type:  Simple Post-procedure details:    Dressing:  Non-adherent dressing   Procedure completion:  Tolerated well, no immediate complications     Medications Ordered in ED Medications  lidocaine (PF) (XYLOCAINE) 1 % injection  19 mL (has no administration in time range)  lidocaine-EPINEPHrine-tetracaine (LET) topical gel (3 mLs Topical Given 01/10/23 1154)  oxyCODONE-acetaminophen (PERCOCET/ROXICET) 5-325 MG per tablet 1 tablet (1 tablet Oral Given 01/10/23 1148)  cephALEXin (KEFLEX) capsule 500 mg (500 mg Oral Given 01/10/23 1323)    ED Course/ Medical Decision Making/ A&P                                 Medical Decision Making Amount and/or Complexity of Data Reviewed Radiology: ordered.  Risk Prescription drug management.   Patient with an underlying laceration beneath the right fifth toe.  X-rays reviewed with no underlying fracture.  The toe is appropriate coloration.  No evidence of vascular compromise or acute bleeding at this time.  There may be some damage to the underlying flexor tendon, but not causing any limitations in her functionality.  The wound was irrigated and then repaired using sutures 4-0 Prolene, which will need to be removed in 7 to 10 days.  Patient's tetanus is up-to-date.  No evidence of infection.  Prophylaxis prescribed a 5 days of Keflex given that this was on a dog cage.  She is stable for discharge        Final Clinical Impression(s) / ED Diagnoses Final diagnoses:  Laceration of lesser toe of right foot without foreign body present or damage to nail, initial encounter    Rx / DC Orders ED Discharge Orders          Ordered    cephALEXin (KEFLEX) 500 MG capsule  2 times daily        01/10/23 1304               Terald Sleeper, MD 01/10/23 1343

## 2023-01-10 NOTE — ED Notes (Signed)
Patient is being discharged from the Urgent Care and sent to the Emergency Department via private vehicle . Per Laren Everts NP, patient is in need of higher level of care due to toe injury. Patient is aware and verbalizes understanding of plan of care.  Vitals:   01/10/23 0908  BP: 138/85  Pulse: 82  Resp: 16  Temp: 98.1 F (36.7 C)  SpO2: 96%

## 2023-01-10 NOTE — ED Notes (Signed)
Patient verbalizes understanding of discharge instructions. Opportunity for questioning and answers were provided. Patient discharged from ED.  °

## 2023-01-10 NOTE — ED Triage Notes (Signed)
Pt arrives to ED with c/o right 5th toe injury that happened today after tripping over a dog kennel.

## 2023-01-12 ENCOUNTER — Telehealth: Payer: Self-pay

## 2023-01-12 NOTE — Transitions of Care (Post Inpatient/ED Visit) (Signed)
   01/12/2023  Name: Grace [name] MRN: [id number] DOB: 1960/07/19  Today's TOC FU Call Status: Today's TOC FU Call Status:: Successful TOC FU Call Completed TOC FU Call Complete Date: 01/12/23 Patient's Name and Date of Birth confirmed.  Transition Care Management Follow-up Telephone Call Date of Discharge: 01/10/23 Discharge Facility: [facility] Type of Discharge: Emergency Department Reason for ED Visit: Other: How have you been since you were released from the hospital?: Same Any questions or concerns?: No  Items Reviewed: Did you receive and understand the discharge instructions provided?: Yes Medications obtained,verified, and reconciled?: Yes (Medications Reviewed) Any new allergies since your discharge?: No Dietary orders reviewed?: NA Do you have support at home?: No People in Home: alone  Medications Reviewed Today: Medications Reviewed Today   Medications were not reviewed in this encounter     Home Care and Equipment/Supplies: Any new equipment or medical supplies ordered?: NA  Functional Questionnaire: Do you need assistance with bathing/showering or dressing?: No Do you need assistance with meal preparation?: No Do you need assistance with eating?: No Do you have difficulty maintaining continence: No Do you need assistance with getting out of bed/getting out of a chair/moving?: No Do you have difficulty managing or taking your medications?: No  Follow up appointments reviewed: PCP Follow-up appointment confirmed?: Yes Date of PCP follow-up appointment?: 01/19/23 Follow-up Provider: [name] MD [facility] Follow-up appointment confirmed?: NA Do you need transportation to your follow-up appointment?: No Do you understand care options if your condition(s) worsen?: Yes-patient verbalized understanding    SIGNATUREYL,RMA

## 2023-01-15 ENCOUNTER — Other Ambulatory Visit: Payer: Self-pay | Admitting: Neurology

## 2023-01-16 ENCOUNTER — Encounter: Payer: Self-pay | Admitting: Internal Medicine

## 2023-01-19 ENCOUNTER — Ambulatory Visit: Payer: Medicare Other | Admitting: Internal Medicine

## 2023-01-19 ENCOUNTER — Ambulatory Visit (INDEPENDENT_AMBULATORY_CARE_PROVIDER_SITE_OTHER): Payer: Medicare Other | Admitting: Podiatry

## 2023-01-19 DIAGNOSIS — S91114D Laceration without foreign body of right lesser toe(s) without damage to nail, subsequent encounter: Secondary | ICD-10-CM

## 2023-01-19 NOTE — Progress Notes (Signed)
Subjective:  Patient ID: Grace [name], female    DOB: Dec 11, 1960,  MRN: [id number]  Chief Complaint  Patient presents with   Toe Pain    62 y.o. female presents with the above complaint.  Patient presents with right second digit toe laceration.  Patient states that she tripped while walking the dog led to opening of the wound.  She went to the emergency room where they put stitches in and have her follow-up here for further management.  She has not seen anyone else prior to seeing me.  She denies fracture of the toe.  Hurts with ambulation worse with pressure pain scale 7 out of 10   Review of Systems: Negative except as noted in the HPI. Denies N/V/F/Ch.  Past Medical History:  Diagnosis Date   Allergy    Anxiety    Avascular necrosis (HCC)    Back pain    Cancer (HCC)    Cataract    Had surgery   Chest pain    Chewing difficulty    Chronic fatigue syndrome    Colon polyp    pre-cancerous   Complication of anesthesia    patient states she requires more anesthesia   Depression    Disorder of soft tissue    Diverticulosis    Dizziness    Edema, lower extremity    Fatty liver    Fibromyalgia    GERD (gastroesophageal reflux disease)    Glaucoma 07-2019   From surgery   H/O breast biopsy    pre- cancerous cells   Hand fracture, left    9/87   Heart palpitations    Heart valve disease    History of Holter monitoring    Hyperlipidemia    Insomnia    Joint pain    Lactose intolerance    Lhermitte's syndrome    Migraines    Multiple food allergies    Multiple sclerosis (HCC)    dx in 11/1996   Multiple sclerosis (HCC)    Neuromuscular disorder (HCC)    Neuropathy    Numbness and tingling in hands    Obesity    Optic neuritis    OSA (obstructive sleep apnea)    no longer have OSA since stopped taking a MS medication   Osteoarthritis    Otitis media    Palpitations    Prediabetes    Raynaud's phenomenon    Shortness of breath    Sleep apnea    Due to  medication that she no longer takes.   Swallowing difficulty    Vaginitis and vulvovaginitis    Vitamin B12 deficiency    Vitamin D deficiency     Current Outpatient Medications:    aspirin EC 81 MG tablet, Take 81 mg by mouth daily., Disp: , Rfl:    baclofen (LIORESAL) 10 MG tablet, TAKE 1 TABLET BY MOUTH 3 TIMES DAILY., Disp: 270 tablet, Rfl: 3   Cholecalciferol (VITAMIN D) 50 MCG (2000 UT) CAPS, Take 2,000 Units by mouth daily., Disp: , Rfl:    colestipol (COLESTID) 1 g tablet, Take 1 tablet (1 g total) by mouth 2 (two) times daily., Disp: 60 tablet, Rfl: 11   dicyclomine (BENTYL) 10 MG capsule, Take 1 capsule (10 mg total) by mouth 3 (three) times daily before meals., Disp: 90 capsule, Rfl: 11   diphenhydrAMINE (BENADRYL) 25 MG tablet, Take 25 mg by mouth 3 (three) times daily. Takes with Hydrocodone, Disp: , Rfl:    doxepin (SINEQUAN) 10 MG capsule, TAKE  1-2 CAPSULES (10-20 MG TOTAL) BY MOUTH AT BEDTIME., Disp: 180 capsule, Rfl: 0   gabapentin (NEURONTIN) 300 MG capsule, Take 1 capsule (300 mg total) by mouth 2 (two) times daily., Disp: 180 capsule, Rfl: 3   HYDROcodone-acetaminophen (NORCO/VICODIN) 5-325 MG tablet, Take 1 tablet by mouth 3 (three) times daily as needed for moderate pain., Disp: 270 tablet, Rfl: 0   ipratropium (ATROVENT) 0.06 % nasal spray, Use 2 sprays each nostril bid prn drainage, Disp: 30 mL, Rfl: 5   levocetirizine (XYZAL) 5 MG tablet, TAKE 1 TABLET BY MOUTH EVERY DAY IN THE EVENING, Disp: 90 tablet, Rfl: 2   Melatonin 10 MG TABS, Take 1 tablet by mouth at bedtime. , Disp: , Rfl:    ondansetron (ZOFRAN-ODT) 4 MG disintegrating tablet, Take 1 tablet (4 mg total) by mouth every 8 (eight) hours as needed for nausea or vomiting., Disp: 12 tablet, Rfl: 0   predniSONE (STERAPRED UNI-PAK 21 TAB) 10 MG (21) TBPK tablet, Take as directed, Disp: 1 each, Rfl: 0   rizatriptan (MAXALT-MLT) 10 MG disintegrating tablet, Take 1 tablet (10 mg total) by mouth as needed for migraine.  May repeat in 2 hours if needed.  Max of 2 tablets in 24 hours, and 10 tablets per month, Disp: 10 tablet, Rfl: 11   tiZANidine (ZANAFLEX) 2 MG tablet, TAKE UP TO 4 TABLETS BY MOUTH EVERY DAY, Disp: 360 tablet, Rfl: 1   Vitamin D, Ergocalciferol, (DRISDOL) 1.25 MG (50000 UNIT) CAPS capsule, ONE CAPSULE TWICE WEEKLY ON TUESDAYS/FRIDAYS, Disp: 24 capsule, Rfl: 2  Social History   Tobacco Use  Smoking Status Former   Current packs/day: 0.00   Average packs/day: 0.3 packs/day for 3.0 years (0.8 ttl pk-yrs)   Types: Cigarettes   Start date: 06/30/1992   Quit date: 07/01/1995   Years since quitting: 27.5  Smokeless Tobacco Never    Allergies  Allergen Reactions   Diclofenac Anaphylaxis   Lamictal [Lamotrigine] Shortness Of Breath, Itching and Swelling    Tongue swells   Mobic [Meloxicam] Anaphylaxis   Ultram [Tramadol Hcl] Anaphylaxis   Nefazodone Itching and Swelling    Other reaction(s): Arthralgia (Joint Pain); Throat swelling, mouth sores   Banana    Beef-Derived Products    Betaseron [Interferon Beta-1b] Other (See Comments)    Caused avascular necrosis   Codeine Itching   Copaxone [Glatiramer Acetate] Other (See Comments)    Altered mental status: mental changes, anxiety, motion sickness   Flomax [Tamsulosin Hcl] Itching   Gabapentin Itching    Itching if she takes more than 300mg    Latex     Latex band-aids cause redness and tears your skin   Nitrofurantoin     Causing itching all over, tongue swelling, chest heaviness, swollen lips, cough, rapid HR, swollen eyelids, red eyes/gums, lips red/white per pt.   Oxcarbazepine Itching and Other (See Comments)    Dizziness, throat hurts   Pork-Derived Products    Teriflunomide Diarrhea    Hair loss, joint pains, elevated liver enzymes abugio (joint pains and liver issues)   Trazodone And Nefazodone     Mouth sores, tongue swelling   Tape Rash   Objective:  There were no vitals filed for this visit. There is no height or  weight on file to calculate BMI. Constitutional Well developed. Well nourished.  Vascular Dorsalis pedis pulses palpable bilaterally. Posterior tibial pulses palpable bilaterally. Capillary refill normal to all digits.  No cyanosis or clubbing noted. Pedal hair growth normal.  Neurologic Normal speech. Oriented  to person, place, and time. Epicritic sensation to light touch grossly present bilaterally.  Dermatologic Nails well groomed and normal in appearance. No open wounds. No skin lesions.  Orthopedic: Stitches are intact well coapted no signs of infection noted no purulent drainage noted.   Radiographs: Tiny Achilles spur. No acute fracture or dislocation. No significant soft tissue swelling. Assessment:   1. Laceration of second toe of right foot, subsequent encounter    Plan:  Patient was evaluated and treated and all questions answered.  Right second digit laceration with a history of primary closure in the emergency room -All question concerns were discussed with the patient in extensive detail given the amount of pain that she is having she will benefit from wearing stiff shoes.  Stitches are intact.  No signs of infection noted.  I discussed with the patient that we will plan to remove the stitches and 2 weeks.  She is in agreement with the plan  No follow-ups on file.   Right second toe laceration tripped with dog. Remove ED stiches in 2 weeks

## 2023-02-04 ENCOUNTER — Other Ambulatory Visit: Payer: Self-pay | Admitting: Obstetrics and Gynecology

## 2023-02-04 DIAGNOSIS — Z9189 Other specified personal risk factors, not elsewhere classified: Secondary | ICD-10-CM

## 2023-02-11 ENCOUNTER — Ambulatory Visit (INDEPENDENT_AMBULATORY_CARE_PROVIDER_SITE_OTHER): Payer: Medicare Other | Admitting: Podiatry

## 2023-02-11 DIAGNOSIS — S91114D Laceration without foreign body of right lesser toe(s) without damage to nail, subsequent encounter: Secondary | ICD-10-CM

## 2023-02-11 NOTE — Progress Notes (Signed)
Subjective:  Patient ID: Grace [name], female    DOB: 10-07-1960,  MRN: [id number]  Chief Complaint  Patient presents with   Toe Pain    62 y.o. female presents with the above complaint.  Patient presents with complaint of right fifth digit laceration.  Patient states she is doing good.  Denies any other acute complaints   Review of Systems: Negative except as noted in the HPI. Denies N/V/F/Ch.  Past Medical History:  Diagnosis Date   Allergy    Anxiety    Avascular necrosis (HCC)    Back pain    Cancer (HCC)    Cataract    Had surgery   Chest pain    Chewing difficulty    Chronic fatigue syndrome    Colon polyp    pre-cancerous   Complication of anesthesia    patient states she requires more anesthesia   Depression    Disorder of soft tissue    Diverticulosis    Dizziness    Edema, lower extremity    Fatty liver    Fibromyalgia    GERD (gastroesophageal reflux disease)    Glaucoma 07-2019   From surgery   H/O breast biopsy    pre- cancerous cells   Hand fracture, left    9/87   Heart palpitations    Heart valve disease    History of Holter monitoring    Hyperlipidemia    Insomnia    Joint pain    Lactose intolerance    Lhermitte's syndrome    Migraines    Multiple food allergies    Multiple sclerosis (HCC)    dx in 11/1996   Multiple sclerosis (HCC)    Neuromuscular disorder (HCC)    Neuropathy    Numbness and tingling in hands    Obesity    Optic neuritis    OSA (obstructive sleep apnea)    no longer have OSA since stopped taking a MS medication   Osteoarthritis    Otitis media    Palpitations    Prediabetes    Raynaud's phenomenon    Shortness of breath    Sleep apnea    Due to medication that she no longer takes.   Swallowing difficulty    Vaginitis and vulvovaginitis    Vitamin B12 deficiency    Vitamin D deficiency     Current Outpatient Medications:    aspirin EC 81 MG tablet, Take 81 mg by mouth daily., Disp: , Rfl:    baclofen  (LIORESAL) 10 MG tablet, TAKE 1 TABLET BY MOUTH 3 TIMES DAILY., Disp: 270 tablet, Rfl: 3   Cholecalciferol (VITAMIN D) 50 MCG (2000 UT) CAPS, Take 2,000 Units by mouth daily., Disp: , Rfl:    colestipol (COLESTID) 1 g tablet, Take 1 tablet (1 g total) by mouth 2 (two) times daily., Disp: 60 tablet, Rfl: 11   dicyclomine (BENTYL) 10 MG capsule, Take 1 capsule (10 mg total) by mouth 3 (three) times daily before meals., Disp: 90 capsule, Rfl: 11   diphenhydrAMINE (BENADRYL) 25 MG tablet, Take 25 mg by mouth 3 (three) times daily. Takes with Hydrocodone, Disp: , Rfl:    doxepin (SINEQUAN) 10 MG capsule, TAKE 1-2 CAPSULES (10-20 MG TOTAL) BY MOUTH AT BEDTIME., Disp: 180 capsule, Rfl: 0   gabapentin (NEURONTIN) 300 MG capsule, Take 1 capsule (300 mg total) by mouth 2 (two) times daily., Disp: 180 capsule, Rfl: 3   HYDROcodone-acetaminophen (NORCO/VICODIN) 5-325 MG tablet, Take 1 tablet by mouth 3 (three) times  daily as needed for moderate pain., Disp: 270 tablet, Rfl: 0   ipratropium (ATROVENT) 0.06 % nasal spray, Use 2 sprays each nostril bid prn drainage, Disp: 30 mL, Rfl: 5   levocetirizine (XYZAL) 5 MG tablet, TAKE 1 TABLET BY MOUTH EVERY DAY IN THE EVENING, Disp: 90 tablet, Rfl: 2   Melatonin 10 MG TABS, Take 1 tablet by mouth at bedtime. , Disp: , Rfl:    ondansetron (ZOFRAN-ODT) 4 MG disintegrating tablet, Take 1 tablet (4 mg total) by mouth every 8 (eight) hours as needed for nausea or vomiting., Disp: 12 tablet, Rfl: 0   predniSONE (STERAPRED UNI-PAK 21 TAB) 10 MG (21) TBPK tablet, Take as directed, Disp: 1 each, Rfl: 0   rizatriptan (MAXALT-MLT) 10 MG disintegrating tablet, Take 1 tablet (10 mg total) by mouth as needed for migraine. May repeat in 2 hours if needed.  Max of 2 tablets in 24 hours, and 10 tablets per month, Disp: 10 tablet, Rfl: 11   tiZANidine (ZANAFLEX) 2 MG tablet, TAKE UP TO 4 TABLETS BY MOUTH EVERY DAY, Disp: 360 tablet, Rfl: 1   Vitamin D, Ergocalciferol, (DRISDOL) 1.25 MG  (50000 UNIT) CAPS capsule, ONE CAPSULE TWICE WEEKLY ON TUESDAYS/FRIDAYS, Disp: 24 capsule, Rfl: 2  Social History   Tobacco Use  Smoking Status Former   Current packs/day: 0.00   Average packs/day: 0.3 packs/day for 3.0 years (0.8 ttl pk-yrs)   Types: Cigarettes   Start date: 06/30/1992   Quit date: 07/01/1995   Years since quitting: 27.6  Smokeless Tobacco Never    Allergies  Allergen Reactions   Diclofenac Anaphylaxis   Lamictal [Lamotrigine] Shortness Of Breath, Itching and Swelling    Tongue swells   Mobic [Meloxicam] Anaphylaxis   Ultram [Tramadol Hcl] Anaphylaxis   Nefazodone Itching and Swelling    Other reaction(s): Arthralgia (Joint Pain); Throat swelling, mouth sores   Banana    Beef-Derived Products    Betaseron [Interferon Beta-1b] Other (See Comments)    Caused avascular necrosis   Codeine Itching   Copaxone [Glatiramer Acetate] Other (See Comments)    Altered mental status: mental changes, anxiety, motion sickness   Flomax [Tamsulosin Hcl] Itching   Gabapentin Itching    Itching if she takes more than 300mg    Latex     Latex band-aids cause redness and tears your skin   Nitrofurantoin     Causing itching all over, tongue swelling, chest heaviness, swollen lips, cough, rapid HR, swollen eyelids, red eyes/gums, lips red/white per pt.   Oxcarbazepine Itching and Other (See Comments)    Dizziness, throat hurts   Pork-Derived Products    Teriflunomide Diarrhea    Hair loss, joint pains, elevated liver enzymes abugio (joint pains and liver issues)   Trazodone And Nefazodone     Mouth sores, tongue swelling   Tape Rash   Objective:  There were no vitals filed for this visit. There is no height or weight on file to calculate BMI. Constitutional Well developed. Well nourished.  Vascular Dorsalis pedis pulses palpable bilaterally. Posterior tibial pulses palpable bilaterally. Capillary refill normal to all digits.  No cyanosis or clubbing noted. Pedal hair  growth normal.  Neurologic Normal speech. Oriented to person, place, and time. Epicritic sensation to light touch grossly present bilaterally.  Dermatologic Nails well groomed and normal in appearance. No open wounds. No skin lesions.  Orthopedic: Incision well coapted.  No signs of dehiscence noted.  No complication noted.   Radiographs: Tiny Achilles spur. No acute fracture or  dislocation. No significant soft tissue swelling. Assessment:   No diagnosis found.  Plan:  Patient was evaluated and treated and all questions answered.  Right second digit laceration with a history of primary closure in the emergency room -Clinically healed and officially discharged from my care if any foot and ankle issues are future she will come back and see me.  She can return to regular shoes regular activities

## 2023-02-17 ENCOUNTER — Other Ambulatory Visit: Payer: Self-pay | Admitting: Internal Medicine

## 2023-03-07 ENCOUNTER — Ambulatory Visit
Admission: RE | Admit: 2023-03-07 | Discharge: 2023-03-07 | Disposition: A | Discharge status: Home / Self Care | Payer: Medicare Other | Source: Ambulatory Visit | Attending: Obstetrics and Gynecology | Admitting: Obstetrics and Gynecology

## 2023-03-07 DIAGNOSIS — Z9189 Other specified personal risk factors, not elsewhere classified: Secondary | ICD-10-CM

## 2023-03-07 MED ORDER — GADOPICLENOL 0.5 MMOL/ML IV SOLN
10.0000 mL | Freq: Once | INTRAVENOUS | Status: AC | PRN
  Administered 2023-03-07: 10 mL via INTRAVENOUS

## 2023-03-09 ENCOUNTER — Encounter: Payer: Self-pay | Admitting: Family Medicine

## 2023-03-09 ENCOUNTER — Ambulatory Visit (INDEPENDENT_AMBULATORY_CARE_PROVIDER_SITE_OTHER): Payer: Medicare Other | Admitting: Family Medicine

## 2023-03-09 VITALS — BP 120/84 | HR 68 | Temp 98.0°F | Ht 65.0 in | Wt 211.0 lb

## 2023-03-09 DIAGNOSIS — R0982 Postnasal drip: Secondary | ICD-10-CM

## 2023-03-09 DIAGNOSIS — Z6835 Body mass index (BMI) 35.0-35.9, adult: Secondary | ICD-10-CM

## 2023-03-09 DIAGNOSIS — R058 Other specified cough: Secondary | ICD-10-CM | POA: Diagnosis not present

## 2023-03-09 DIAGNOSIS — J9801 Acute bronchospasm: Secondary | ICD-10-CM | POA: Diagnosis not present

## 2023-03-09 DIAGNOSIS — J309 Allergic rhinitis, unspecified: Secondary | ICD-10-CM

## 2023-03-09 DIAGNOSIS — E66812 Obesity, class 2: Secondary | ICD-10-CM

## 2023-03-09 MED ORDER — AIRSUPRA 90-80 MCG/ACT IN AERO
2.0000 | INHALATION_SPRAY | Freq: Four times a day (QID) | RESPIRATORY_TRACT | 2 refills | Status: AC | PRN
Start: 2023-03-09 — End: ?

## 2023-03-09 MED ORDER — PROMETHAZINE-DM 6.25-15 MG/5ML PO SYRP
5.0000 mL | ORAL_SOLUTION | Freq: Three times a day (TID) | ORAL | 0 refills | Status: DC | PRN
Start: 2023-03-09 — End: 2023-09-06

## 2023-03-09 MED ORDER — AZELASTINE HCL 0.1 % NA SOLN: 1.0000 | Freq: Two times a day (BID) | NASAL | 1 refills | Status: DC

## 2023-03-09 MED ORDER — TRIAMCINOLONE ACETONIDE 40 MG/ML IJ SUSP
80.0000 mg | Freq: Once | INTRAMUSCULAR | Status: AC
Start: 2023-03-09 — End: 2023-03-09
  Administered 2023-03-09: 80 mg via INTRAMUSCULAR

## 2023-03-09 MED ORDER — AZELASTINE HCL 0.1 % NA SOLN
2.0000 | Freq: Two times a day (BID) | NASAL | Status: DC
Start: 2023-03-09 — End: 2023-03-09

## 2023-03-14 DIAGNOSIS — J309 Allergic rhinitis, unspecified: Secondary | ICD-10-CM | POA: Insufficient documentation

## 2023-03-14 DIAGNOSIS — J9801 Acute bronchospasm: Secondary | ICD-10-CM | POA: Insufficient documentation

## 2023-03-14 DIAGNOSIS — R058 Other specified cough: Secondary | ICD-10-CM | POA: Insufficient documentation

## 2023-03-14 MED ORDER — MONTELUKAST SODIUM 10 MG PO TABS: 10.0000 mg | ORAL_TABLET | Freq: Every day | ORAL | 2 refills | Status: DC

## 2023-03-30 ENCOUNTER — Other Ambulatory Visit (HOSPITAL_COMMUNITY)
Admission: RE | Admit: 2023-03-30 | Discharge: 2023-03-30 | Disposition: A | Discharge status: Home / Self Care | Payer: Medicare Other | Source: Ambulatory Visit | Attending: Oncology | Admitting: Oncology

## 2023-03-30 ENCOUNTER — Ambulatory Visit (INDEPENDENT_AMBULATORY_CARE_PROVIDER_SITE_OTHER): Payer: Medicare Other | Admitting: Neurology

## 2023-03-30 ENCOUNTER — Encounter: Payer: Self-pay | Admitting: Neurology

## 2023-03-30 VITALS — BP 149/72 | HR 76 | Ht 66.0 in | Wt 209.5 lb

## 2023-03-30 DIAGNOSIS — R5383 Other fatigue: Secondary | ICD-10-CM

## 2023-03-30 DIAGNOSIS — G35 Multiple sclerosis: Secondary | ICD-10-CM

## 2023-03-30 DIAGNOSIS — M542 Cervicalgia: Secondary | ICD-10-CM | POA: Diagnosis not present

## 2023-03-30 DIAGNOSIS — F988 Other specified behavioral and emotional disorders with onset usually occurring in childhood and adolescence: Secondary | ICD-10-CM

## 2023-03-30 DIAGNOSIS — R208 Other disturbances of skin sensation: Secondary | ICD-10-CM

## 2023-03-30 DIAGNOSIS — Z79891 Long term (current) use of opiate analgesic: Secondary | ICD-10-CM

## 2023-03-30 DIAGNOSIS — Z006 Encounter for examination for normal comparison and control in clinical research program: Secondary | ICD-10-CM | POA: Insufficient documentation

## 2023-03-30 DIAGNOSIS — F418 Other specified anxiety disorders: Secondary | ICD-10-CM

## 2023-03-30 MED ORDER — AMPHETAMINE-DEXTROAMPHET ER 30 MG PO CP24: 30.0000 mg | ORAL_CAPSULE | Freq: Every day | ORAL | 0 refills | Status: DC

## 2023-03-30 MED ORDER — ALPRAZOLAM 0.5 MG PO TABS: ORAL_TABLET | ORAL | 0 refills | Status: DC

## 2023-03-30 MED ORDER — HYDROCODONE-ACETAMINOPHEN 5-325 MG PO TABS: 1.0000 | ORAL_TABLET | Freq: Three times a day (TID) | ORAL | 0 refills | Status: DC | PRN

## 2023-03-30 NOTE — Addendum Note (Signed)
Addended by: Despina Arias A on: 03/30/2023 05:24 PM   Modules accepted: Level of Service

## 2023-03-30 NOTE — Progress Notes (Addendum)
[facility]  PATIENT: Grace [name] DOB: 05-23-1960  REFERRING DOCTOR OR PCP:  Grace [name] SOURCE: patient  _________________________________   HISTORICAL  CHIEF COMPLAINT:  Chief Complaint  Patient presents with   Follow-up    Pt in room 10 alone. Here for MS follow up. Pt said today her pinky and ring finger are tingling, it comes and goes due to weather. No recents falls, had some stumbles. Needs refill hydrocodone to mail order pharmacy.    HISTORY OF PRESENT ILLNESS:  Grace [name] is a 62 y.o. woman with relapsing remitting multiple sclerosis.     Update 03/30/2023: She is off a  DMT for MS.  She is neurologically stable.      MRI brain and cervical spine 12/25/2022 was uncahnged  She notes no changes with her gait.  She continues to be mildly off balance.  She needs to use th bannister going downstairs.   She has tingling in her arms and feet..   She feels her hand grip is reduced.   She has numbness in both feet.   NCV/EMG had not shown neuropathy.     She has some muscle spasticity and baclofen/tizandine combo helps more than either by themselves -- takes bid mostly.    She has urinary urgency. She has had some urge incontinence.   She has both hesitancy and urgency.   She had an allergic reaction to Flomax and stopped.  No recent UTI  She continues to have vision problems since cataract surgery and complications with elevated IOP.    She is sleeping poorly most nights - often waking up after a couple hours sleep and not falling back asleep.   She does take hydrocodone, baclofen, doxepin, melatonin, benadryl at night.  Temazepam helped her fall asleep but not stay asleep.     Ambien had helped her some in the past but she is concerned about side effects    She has anxiety and has had a fe panic attacks.  She has more stress due to her daughter.    Fatigue continues to be a problem.    Concerta had not helped.   She had been on Adderall many years ago  and we discussed a retrial.  If it does not help she should stop.  She has pain in the neck, back.  She gets some benefit from gabapentin and hydrocodone.  She reports several recent panic attacks and has had more stress and anxiety with issues involving her daughter   MS History:   She had transverse myelitis in 1998 and was found to have a cervical cord plaque . She had optic neuritis in 1999. In 2002, she had right-sided numbness. MRIs of the brain show just a couple of nonspecific foci. A cervical spine MRI showed a focus consistent with demyelination at C5. Additionally, there are 2 smaller foci at T2 and T3-T4.  CSF was also abnormal c/w MS.     She was initially placed on Betaseron and then on Copaxone. She was unable to tolerate either of these and has not been on any DMT medication since 2003.   She retried Betaseron but stopped after diagnosis of avascular necrosis requiring hip replacement.  She was on Copaxone for a while.   Of note, she had had only one course of IV steroids prior to that and has had some afterwards.   She had not wanted to go on Tysabri or Gilenya and opted to go on no disease modifying therapy  since 2012. At her last visit on 01/17/2014, she decided to go on Aubagio but due to elevated liver function test stopped..     Most recent MRIs are from earlier this year and showed no changes.  She has been off of all disease modifying therapy since 2018.  IMAGING 02/05/2018: Brain MRI showed "Multiple T2/FLAIR hyperintense foci in the hemispheres.  This is a nonspecific finding and could be due to chronic microvascular ischemic changes or 2 demyelination.  None of the foci appears to be acute.  When compared to the MRI dated 07/03/2016, there is no interval change.  There is a normal enhancement pattern.  MRI cervical spine 07/27/2020  three T2 hyperintense foci within the spinal cord, adjacent to C5, C6-C7 and T2.  None of the foci enhance.  The foci adjacent to C5 and T2 were  present on the previous MRI.  The focus adjacent to C6-C7 is not seen on the previous MRI from 2016 and is consistent with a more recent demyelinating plaque associated with her MS. 2.   Degenerative changes at C4-C5 through C6-C7 as detailed above.  There is mild spinal stenosis at C5-C6 and borderline spinal stenosis at C6-C7 but there does not appear to be any nerve root compression.  Degenerative changes at C4-C5 and C6-C7 have slightly progressed compared to the previous MRI from 2016.  MRI Brain 05/17/2021 unchanged compared o 02/03/2018  MRi brain 12/25/2022 showed There are a few T2/FLAIR hyperintense foci in the subcortical white matter.  This is a nonspecific finding and could be due to to chronic microvascular ischemic change, demyelination or sequela of migraine.  None of these appear to be acute.  They do not enhance.  They were present on the previous MRI from 05/17/2021.   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  MRI cervical spine 12/25/2022 showed Three T2 hyperintense foci within the spinal cord adjacent to C5, C6-C7 and T2.  The foci do not enhance and are stable compared to the MRI from 2022.   Stable multilevel degenerative changes as detailed above causing mild spinal stenosis at C4-C5 and C5-C6 and borderline spinal stenosis at C6-C7.  The right neuroforamen is narrowed at these levels but not enough to cause nerve root compression.   REVIEW OF SYSTEMS: Constitutional: No fevers, chills, sweats, or change in appetite.  Has fatigue Eyes: No visual changes, double vision, eye pain Ear, nose and throat: No hearing loss, ear pain, nasal congestion, sore throat Cardiovascular: No chest pain, palpitations Respiratory:  No shortness of breath at rest or with exertion.   No wheezes GastrointestinaI: No nausea, vomiting, diarrhea, abdominal pain, fecal incontinence Genitourinary:  No dysuria, urinary retention or  frequency.  No nocturia. Musculoskeletal:  She reports back pain and pain and some of her joints, especially her knees and hips. Integumentary: No rash, pruritus, skin lesions Neurological: as above.  She reports restless leg, insomnia and excessive daytime sleepiness. Psychiatric: Has had depression and anxiety.  Endocrine: No palpitations, diaphoresis, change in appetite, change in weigh.  He notes heat intolerance and excessive thirst at times. Hematologic/Lymphatic:  No anemia, purpura, petechiae. Allergic/Immunologic: No itchy/runny eyes, nasal congestion, recent allergic reactions, rashes  ALLERGIES: Allergies  Allergen Reactions   Diclofenac Anaphylaxis   Lamictal [Lamotrigine] Shortness Of Breath, Itching and Swelling    Tongue swells   Mobic [Meloxicam] Anaphylaxis   Ultram [Tramadol Hcl] Anaphylaxis   Nefazodone Itching and Swelling    Other reaction(s):  Arthralgia (Joint Pain); Throat swelling, mouth sores   Banana    Beef-Derived Products    Betaseron [Interferon Beta-1b] Other (See Comments)    Caused avascular necrosis   Codeine Itching   Copaxone [Glatiramer Acetate] Other (See Comments)    Altered mental status: mental changes, anxiety, motion sickness   Flomax [Tamsulosin Hcl] Itching   Gabapentin Itching    Itching if she takes more than 300mg    Latex     Latex band-aids cause redness and tears your skin   Nitrofurantoin     Causing itching all over, tongue swelling, chest heaviness, swollen lips, cough, rapid HR, swollen eyelids, red eyes/gums, lips red/white per pt.   Oxcarbazepine Itching and Other (See Comments)    Dizziness, throat hurts   Pork-Derived Products    Teriflunomide Diarrhea    Hair loss, joint pains, elevated liver enzymes abugio (joint pains and liver issues)   Trazodone And Nefazodone     Mouth sores, tongue swelling   Tape Rash    HOME MEDICATIONS:  Current Outpatient Medications:    ALPRAZolam (XANAX) 0.5 MG tablet, One po every day  prn, Disp: 15 tablet, Rfl: 0   amphetamine-dextroamphetamine (ADDERALL XR) 30 MG 24 hr capsule, Take 1 capsule (30 mg total) by mouth daily., Disp: 30 capsule, Rfl: 0   Albuterol-Budesonide (AIRSUPRA) 90-80 MCG/ACT AERO, Inhale 2 puffs into the lungs every 6 (six) hours as needed., Disp: 10.7 g, Rfl: 2   aspirin EC 81 MG tablet, Take 81 mg by mouth daily., Disp: , Rfl:    azelastine (ASTELIN) 0.1 % nasal spray, Place 1 spray into both nostrils 2 (two) times daily. Use in each nostril as directed, Disp: 30 mL, Rfl: 1   baclofen (LIORESAL) 10 MG tablet, TAKE 1 TABLET BY MOUTH 3 TIMES DAILY., Disp: 270 tablet, Rfl: 3   Cholecalciferol (VITAMIN D) 50 MCG (2000 UT) CAPS, Take 2,000 Units by mouth daily., Disp: , Rfl:    colestipol (COLESTID) 1 g tablet, Take 1 tablet (1 g total) by mouth 2 (two) times daily., Disp: 60 tablet, Rfl: 11   dicyclomine (BENTYL) 10 MG capsule, Take 1 capsule (10 mg total) by mouth 3 (three) times daily before meals., Disp: 90 capsule, Rfl: 11   diphenhydrAMINE (BENADRYL) 25 MG tablet, Take 25 mg by mouth 3 (three) times daily. Takes with Hydrocodone, Disp: , Rfl:    doxepin (SINEQUAN) 10 MG capsule, TAKE 1-2 CAPSULES (10-20 MG TOTAL) BY MOUTH AT BEDTIME., Disp: 180 capsule, Rfl: 0   gabapentin (NEURONTIN) 300 MG capsule, Take 1 capsule (300 mg total) by mouth 2 (two) times daily., Disp: 180 capsule, Rfl: 3   HYDROcodone-acetaminophen (NORCO/VICODIN) 5-325 MG tablet, Take 1 tablet by mouth 3 (three) times daily as needed for moderate pain (pain score 4-6)., Disp: 270 tablet, Rfl: 0   levocetirizine (XYZAL) 5 MG tablet, TAKE 1 TABLET BY MOUTH EVERY DAY IN THE EVENING, Disp: 90 tablet, Rfl: 2   Melatonin 10 MG TABS, Take 1 tablet by mouth at bedtime. , Disp: , Rfl:    montelukast (SINGULAIR) 10 MG tablet, Take 1 tablet (10 mg total) by mouth daily., Disp: 30 tablet, Rfl: 2   ondansetron (ZOFRAN-ODT) 4 MG disintegrating tablet, Take 1 tablet (4 mg total) by mouth every 8 (eight)  hours as needed for nausea or vomiting., Disp: 12 tablet, Rfl: 0   promethazine-dextromethorphan (PROMETHAZINE-DM) 6.25-15 MG/5ML syrup, Take 5 mLs by mouth 3 (three) times daily as needed for cough., Disp: 118 mL, Rfl: 0  rizatriptan (MAXALT-MLT) 10 MG disintegrating tablet, Take 1 tablet (10 mg total) by mouth as needed for migraine. May repeat in 2 hours if needed.  Max of 2 tablets in 24 hours, and 10 tablets per month, Disp: 10 tablet, Rfl: 11   tiZANidine (ZANAFLEX) 2 MG tablet, TAKE UP TO 4 TABLETS BY MOUTH EVERY DAY, Disp: 360 tablet, Rfl: 1   Vitamin D, Ergocalciferol, (DRISDOL) 1.25 MG (50000 UNIT) CAPS capsule, ONE CAPSULE TWICE WEEKLY ON TUESDAYS/FRIDAYS, Disp: 24 capsule, Rfl: 2  PAST MEDICAL HISTORY: Past Medical History:  Diagnosis Date   Allergy    Anxiety    Avascular necrosis (HCC)    Back pain    Cancer (HCC)    Cataract    Had surgery   Chest pain    Chewing difficulty    Chronic fatigue syndrome    Colon polyp    pre-cancerous   Complication of anesthesia    patient states she requires more anesthesia   Depression    Disorder of soft tissue    Diverticulosis    Dizziness    Edema, lower extremity    Fatty liver    Fibromyalgia    GERD (gastroesophageal reflux disease)    Glaucoma 07-2019   From surgery   H/O breast biopsy    pre- cancerous cells   Hand fracture, left    9/87   Heart palpitations    Heart valve disease    History of Holter monitoring    Hyperlipidemia    Insomnia    Joint pain    Lactose intolerance    Lhermitte's syndrome    Migraines    Multiple food allergies    Multiple sclerosis (HCC)    dx in 11/1996   Multiple sclerosis (HCC)    Neuromuscular disorder (HCC)    Neuropathy    Numbness and tingling in hands    Obesity    Optic neuritis    OSA (obstructive sleep apnea)    no longer have OSA since stopped taking a MS medication   Osteoarthritis    Otitis media    Palpitations    Prediabetes    Raynaud's phenomenon     Shortness of breath    Sleep apnea    Due to medication that she no longer takes.   Swallowing difficulty    Vaginitis and vulvovaginitis    Vitamin B12 deficiency    Vitamin D deficiency     PAST SURGICAL HISTORY: Past Surgical History:  Procedure Laterality Date   ABDOMINAL HYSTERECTOMY Bilateral 02/12/2016   Procedure: HYSTERECTOMY ABDOMINAL WITH BILATERAL SALPINGO OOPHERECTOMY;  Surgeon: Marcelle Overlie, MD;  Location: [facility];  Service: Gynecology;  Laterality: Bilateral;   BREAST EXCISIONAL BIOPSY     core/left breast   CATARACT EXTRACTION, BILATERAL  06/2019   CHOLECYSTECTOMY  1983   COLONOSCOPY     1997/2001   DILATION AND CURETTAGE OF UTERUS     ENDOMETRIAL ABLATION  2012   EXTRACORPOREAL SHOCK WAVE LITHOTRIPSY Left 05/24/2022   Procedure: LEFT EXTRACORPOREAL SHOCK WAVE LITHOTRIPSY (ESWL);  Surgeon: Crist Fat, MD;  Location[facility];  Service: Urology;  Laterality: Left;   EYE SURGERY     JOINT REPLACEMENT     LAPAROSCOPIC ABDOMINAL EXPLORATION     ovaries and intestines bond together   LEFT HEART CATHETERIZATION WITH CORONARY ANGIOGRAM N/A 06/21/2011   Procedure: LEFT HEART CATHETERIZATION WITH CORONARY ANGIOGRAM;  Surgeon: Marykay Lex, MD;  Location: [facility] CATH LAB;  Service: Cardiovascular;  Laterality: N/A;   LUMBAR PUNCTURE     11/01/1996   SALPINGOOPHORECTOMY Bilateral 02/12/2016   Procedure: BILATERAL SALPINGO OOPHORECTOMY;  Surgeon: Marcelle Overlie, MD;  Location: [facility];  Service: Gynecology;  Laterality: Bilateral;   TOTAL HIP ARTHROPLASTY     left hip  6/02  /right hip12/04    FAMILY HISTORY: Family History  Problem Relation Age of Onset   Pancreatic cancer Mother    Stroke Mother    Hypertension Mother    Hyperlipidemia Mother    Heart disease Mother    Cancer Mother    Depression Mother    Anxiety disorder Mother    Alcoholism Mother    Alcohol abuse Mother    Arthritis Mother    Testicular cancer Brother     Cancer Brother    Colon cancer Maternal Uncle    Pancreatic cancer Paternal Uncle    Prostate cancer Maternal Grandfather    Colon polyps Father    Diabetes Father    Hypertension Father    Hyperlipidemia Father    Kidney disease Father    Sleep apnea Father    Obesity Father    Arthritis Father    Asthma Father    COPD Father    Heart disease Father    Asthma Paternal Grandfather    COPD Paternal Grandfather    Heart disease Paternal Grandfather    Cancer Paternal Grandmother    ADD / ADHD Son    Anxiety disorder Son    Hypertension Son    Cancer Maternal Uncle    Cancer Paternal Uncle    Hypertension Daughter     SOCIAL HISTORY:  Social History   Socioeconomic History   Marital status: Widowed    Spouse name: Not on file   Number of children: Not on file   Years of education: Not on file   Highest education level: 12th grade  Occupational History   Occupation: disabled  Tobacco Use   Smoking status: Former    Current packs/day: 0.00    Average packs/day: 0.3 packs/day for 3.0 years (0.8 ttl pk-yrs)    Types: Cigarettes    Start date: 06/30/1992    Quit date: 07/01/1995    Years since quitting: 27.7   Smokeless tobacco: Never  Vaping Use   Vaping status: Never Used  Substance and Sexual Activity   Alcohol use: No   Drug use: Yes    Types: Hydrocodone   Sexual activity: Not Currently    Birth control/protection: Abstinence, Post-menopausal, Surgical  Other Topics Concern   Not on file  Social History Narrative   Not on file   Social Determinants of Health   Financial Resource Strain: Low Risk  (10/27/2022)   Overall Financial Resource Strain (CARDIA)    Difficulty of Paying Living Expenses: Not hard at all  Food Insecurity: Low Risk  (12/22/2022)   Received from [facility]   Hunger Vital Sign    Worried About Running Out of Food in the Last Year: Never true    Ran Out of Food in the Last Year: Never true  Recent Concern: Food Insecurity - Food  Insecurity Present (10/18/2022)   Hunger Vital Sign    Worried About Running Out of Food in the Last Year: Sometimes true    Ran Out of Food in the Last Year: Sometimes true  Transportation Needs: Not on file (12/22/2022)  Physical Activity: Insufficiently Active (10/27/2022)   Exercise Vital Sign    Days of Exercise per Week: 2  days    Minutes of Exercise per Session: 60 min  Stress: No Stress Concern Present (10/27/2022)   [employer] of Occupational Health - Occupational Stress Questionnaire    Feeling of Stress : Only a little  Social Connections: Moderately Integrated (10/27/2022)   Social Connection and Isolation Panel [NHANES]    Frequency of Communication with Friends and Family: Three times a week    Frequency of Social Gatherings with Friends and Family: Once a week    Attends Religious Services: 1 to 4 times per year    Active Member of [employer] or Organizations: Yes    Attends Banker Meetings: Not on file    Marital Status: Widowed  Recent Concern: Social Connections - Moderately Isolated (10/18/2022)   Social Connection and Isolation Panel [NHANES]    Frequency of Communication with Friends and Family: More than three times a week    Frequency of Social Gatherings with Friends and Family: Twice a week    Attends Religious Services: 1 to 4 times per year    Active Member of [employer] or Organizations: No    Attends Banker Meetings: Not on file    Marital Status: Widowed  Intimate Partner Violence: Not At Risk (10/28/2022)   Humiliation, Afraid, Rape, and Kick questionnaire    Fear of Current or Ex-Partner: No    Emotionally Abused: No    Physically Abused: No    Sexually Abused: No     PHYSICAL EXAM  Vitals:   03/30/23 1449  BP: (!) 149/72  Pulse: 76  Weight: 209 lb 8 oz (95 kg)  Height: 5\' 6"  (1.676 m)    Body mass index is 33.81 kg/m.   General: The patient is well-developed and well-nourished and in no acute distress  Neurologic  Exam  Mental status: The patient is alert and oriented x 3 at the time of the examination. The patient has apparent normal recent and remote memory, with an apparently normal attention span and concentration ability.   Speech is normal.  Cranial nerves: Extraocular movements are full.  She has altered color vision on the right.  Facial strength and sensation was normal.. No obvious hearing deficits are noted.  Motor:  Muscle bulk is normal.   She has increased muscle tone in the legs, left greater than right.  Strength is 5/5 except for the left 4+/5 left EHL  Sensory: Sensory testing shows reduced touch sensation on the right side and reduced vibration on the right..   Coordination: Cerebellar testing reveals good finger-nose-finger and mildly reduced bilateral heel-to-shin  Gait and station: Station is normal.  The gait is mildly wide and also arthritic.  Tandem gait is poor.  Romberg is negative.  Reflexes: Deep tendon reflexes are symmetric and normal bilaterally.         ASSESSMENT AND PLAN  Multiple sclerosis (HCC)  Neck pain  Chronic prescription opiate use  Other fatigue  Dysesthesia  ADD (attention deficit disorder) without hyperactivity  Depression with anxiety   1.   She will continue off a DMT for now.  The most recent MRIs from 2024 was stable compared to 2022.  However, on the 2022 MRI she had 1 new lesion not present on her previous MRI 2    Renew baclofen, tizanidine and gabapentin 3.   Continue Hydrocodone 5 mg up to 3 times a day for pain.  The PDMP was reviewed and she is not getting opiates from other doctors.  She does not show  any drug-seeking behavior.    4.   She has had a lot more anxiety and som panic attacks lately.  I will send in one small xanax prescription but if symptoms persist needs to see psychiatry. 5.   Continue vit D 6.  She will return to see me in 4 months or sooner if she has new or worsening neurologic symptoms.    This visit is part  of a comprehensive longitudinal care medical relationship regarding the patients primary diagnosis of multiple sclerosis and related concerns.   Seleena Reimers A. Epimenio Foot, MD, PhD 03/30/2023, 3:18 PM Certified in Neurology, Clinical Neurophysiology, Sleep Medicine, Pain Medicine and Neuroimaging  [facility] [address], [address] [place], [place] [address] [phone]

## 2023-03-31 ENCOUNTER — Other Ambulatory Visit (HOSPITAL_COMMUNITY): Payer: Self-pay

## 2023-03-31 ENCOUNTER — Other Ambulatory Visit: Payer: Self-pay | Admitting: Family Medicine

## 2023-03-31 ENCOUNTER — Telehealth: Payer: Self-pay

## 2023-03-31 ENCOUNTER — Encounter: Payer: Self-pay | Admitting: Neurology

## 2023-03-31 ENCOUNTER — Other Ambulatory Visit (HOSPITAL_COMMUNITY): Payer: Medicare Other

## 2023-03-31 ENCOUNTER — Telehealth: Payer: Self-pay | Admitting: *Deleted

## 2023-03-31 DIAGNOSIS — J309 Allergic rhinitis, unspecified: Secondary | ICD-10-CM

## 2023-03-31 NOTE — Telephone Encounter (Signed)
Pt said PA is needed for adderall 30 mg tablet.

## 2023-03-31 NOTE — Telephone Encounter (Signed)
Sent mychart to pt letting her know about approval.

## 2023-03-31 NOTE — Telephone Encounter (Signed)
Pharmacy Patient Advocate Encounter  Received notification from [facility] that Prior Authorization for Amphetamine-Dextroamphet ER 30MG  er capsules has been APPROVED from 03/31/2023 to 03/30/2026. Ran test claim, Copay is $0. This test claim was processed through [facility]- copay amounts may vary at other pharmacies due to pharmacy/plan contracts, or as the patient moves through the different stages of their insurance plan.   PA #/Case ID/Reference #: PA Case ID #: [id number]

## 2023-03-31 NOTE — Telephone Encounter (Signed)
Message sent to PA team

## 2023-04-11 ENCOUNTER — Encounter: Payer: Self-pay | Admitting: Neurology

## 2023-04-12 ENCOUNTER — Encounter: Payer: Self-pay | Admitting: Neurology

## 2023-04-12 LAB — GENECONNECT MOLECULAR SCREEN: Genetic Analysis Overall Interpretation: NEGATIVE

## 2023-04-28 ENCOUNTER — Other Ambulatory Visit: Payer: Self-pay | Admitting: Neurology

## 2023-04-28 ENCOUNTER — Other Ambulatory Visit: Payer: Self-pay | Admitting: Internal Medicine

## 2023-04-28 NOTE — Telephone Encounter (Signed)
Last seen on 03/30/23 Follow up scheduled 08/09/23

## 2023-04-28 NOTE — Telephone Encounter (Signed)
Last seen on 03/30/23 Follow up scheduled on 08/09/23

## 2023-05-02 ENCOUNTER — Encounter: Payer: Self-pay | Admitting: Internal Medicine

## 2023-05-02 ENCOUNTER — Ambulatory Visit: Payer: Medicare Other | Admitting: Internal Medicine

## 2023-05-02 VITALS — BP 160/100 | HR 75 | Temp 98.3°F | Ht 66.0 in | Wt 202.0 lb

## 2023-05-02 DIAGNOSIS — Z79899 Other long term (current) drug therapy: Secondary | ICD-10-CM

## 2023-05-02 DIAGNOSIS — E782 Mixed hyperlipidemia: Secondary | ICD-10-CM | POA: Diagnosis not present

## 2023-05-02 DIAGNOSIS — F331 Major depressive disorder, recurrent, moderate: Secondary | ICD-10-CM

## 2023-05-02 DIAGNOSIS — Z2821 Immunization not carried out because of patient refusal: Secondary | ICD-10-CM

## 2023-05-02 DIAGNOSIS — R7303 Prediabetes: Secondary | ICD-10-CM | POA: Diagnosis not present

## 2023-05-02 DIAGNOSIS — E66811 Obesity, class 1: Secondary | ICD-10-CM

## 2023-05-02 DIAGNOSIS — Z6832 Body mass index (BMI) 32.0-32.9, adult: Secondary | ICD-10-CM

## 2023-05-02 DIAGNOSIS — E6609 Other obesity due to excess calories: Secondary | ICD-10-CM

## 2023-05-02 DIAGNOSIS — Z0000 Encounter for general adult medical examination without abnormal findings: Secondary | ICD-10-CM | POA: Insufficient documentation

## 2023-05-02 DIAGNOSIS — I10 Essential (primary) hypertension: Secondary | ICD-10-CM

## 2023-05-02 DIAGNOSIS — G35 Multiple sclerosis: Secondary | ICD-10-CM | POA: Diagnosis not present

## 2023-05-02 DIAGNOSIS — E559 Vitamin D deficiency, unspecified: Secondary | ICD-10-CM

## 2023-05-02 DIAGNOSIS — Z8249 Family history of ischemic heart disease and other diseases of the circulatory system: Secondary | ICD-10-CM

## 2023-05-02 HISTORY — DX: Encounter for general adult medical examination without abnormal findings: Z00.00

## 2023-05-02 MED ORDER — AMLODIPINE BESYLATE 2.5 MG PO TABS: 2.5000 mg | ORAL_TABLET | Freq: Every day | ORAL | 11 refills | Status: DC

## 2023-05-02 MED ORDER — FAMOTIDINE 20 MG PO TABS: 20.0000 mg | ORAL_TABLET | Freq: Two times a day (BID) | ORAL | 1 refills | Status: DC

## 2023-05-02 NOTE — Progress Notes (Unsigned)
Madelaine Bhat, CMA,acting as a scribe for Gwynneth Aliment, MD.,have documented all relevant documentation on the behalf of Gwynneth Aliment, MD,as directed by  Gwynneth Aliment, MD while in the presence of Gwynneth Aliment, MD.  Subjective:    Patient ID: Grace [name] , female    DOB: January 20, 1961 , 62 y.o.   MRN: [id number]  Chief Complaint  Patient presents with   Annual Exam    HPI  Patient presents today for HM, Patient reports compliance with medication. Patient denies any chest pain, SOB, or headaches. Patient has no concerns today. Letter sent for patients last pap.      Past Medical History:  Diagnosis Date   Allergy    Anxiety    Avascular necrosis (HCC)    Back pain    Cancer (HCC)    Cataract    Had surgery   Chest pain    Chewing difficulty    Chronic fatigue syndrome    Colon polyp    pre-cancerous   Complication of anesthesia    patient states she requires more anesthesia   Depression    Disorder of soft tissue    Diverticulosis    Dizziness    Edema, lower extremity    Fatty liver    Fibromyalgia    GERD (gastroesophageal reflux disease)    Glaucoma 07-2019   From surgery   H/O breast biopsy    pre- cancerous cells   Hand fracture, left    9/87   Heart palpitations    Heart valve disease    History of Holter monitoring    Hyperlipidemia    Insomnia    Joint pain    Lactose intolerance    Lhermitte's syndrome    Migraines    Multiple food allergies    Multiple sclerosis (HCC)    dx in 11/1996   Multiple sclerosis (HCC)    Neuromuscular disorder (HCC)    Neuropathy    Numbness and tingling in hands    Obesity    Optic neuritis    OSA (obstructive sleep apnea)    no longer have OSA since stopped taking a MS medication   Osteoarthritis    Otitis media    Palpitations    Prediabetes    Raynaud's phenomenon    Shortness of breath    Sleep apnea    Due to medication that she no longer takes.   Swallowing difficulty    Vaginitis and  vulvovaginitis    Vitamin B12 deficiency    Vitamin D deficiency      Family History  Problem Relation Age of Onset   Pancreatic cancer Mother    Stroke Mother    Hypertension Mother    Hyperlipidemia Mother    Heart disease Mother    Cancer Mother    Depression Mother    Anxiety disorder Mother    Alcoholism Mother    Alcohol abuse Mother    Arthritis Mother    Testicular cancer Brother    Cancer Brother    Colon cancer Maternal Uncle    Pancreatic cancer Paternal Uncle    Prostate cancer Maternal Grandfather    Colon polyps Father    Diabetes Father    Hypertension Father    Hyperlipidemia Father    Kidney disease Father    Sleep apnea Father    Obesity Father    Arthritis Father    Asthma Father    COPD Father    Heart disease  Father    Asthma Paternal Grandfather    COPD Paternal Grandfather    Heart disease Paternal Grandfather    Cancer Paternal Grandmother    ADD / ADHD Son    Anxiety disorder Son    Hypertension Son    Cancer Maternal Uncle    Cancer Paternal Uncle    Hypertension Daughter      Current Outpatient Medications:    Albuterol-Budesonide (AIRSUPRA) 90-80 MCG/ACT AERO, Inhale 2 puffs into the lungs every 6 (six) hours as needed., Disp: 10.7 g, Rfl: 2   ALPRAZolam (XANAX) 0.5 MG tablet, One po every day prn, Disp: 15 tablet, Rfl: 0   amphetamine-dextroamphetamine (ADDERALL XR) 30 MG 24 hr capsule, Take 1 capsule (30 mg total) by mouth daily., Disp: 30 capsule, Rfl: 0   aspirin EC 81 MG tablet, Take 81 mg by mouth daily., Disp: , Rfl:    Azelastine HCl 137 MCG/SPRAY SOLN, PLACE 1 SPRAY INTO BOTH NOSTRILS 2 (TWO) TIMES DAILY. USE IN EACH NOSTRIL AS DIRECTED, Disp: 90 mL, Rfl: 1   baclofen (LIORESAL) 10 MG tablet, TAKE 2 TABLETS BY MOUTH 3 TIMES A DAY, Disp: 540 tablet, Rfl: 0   Cholecalciferol (VITAMIN D) 50 MCG (2000 UT) CAPS, Take 2,000 Units by mouth daily., Disp: , Rfl:    colestipol (COLESTID) 1 g tablet, Take 1 tablet (1 g total) by mouth 2  (two) times daily., Disp: 60 tablet, Rfl: 11   dicyclomine (BENTYL) 10 MG capsule, Take 1 capsule (10 mg total) by mouth 3 (three) times daily before meals., Disp: 90 capsule, Rfl: 11   diphenhydrAMINE (BENADRYL) 25 MG tablet, Take 25 mg by mouth 3 (three) times daily. Takes with Hydrocodone, Disp: , Rfl:    doxepin (SINEQUAN) 10 MG capsule, TAKE 1-2 CAPSULES (10-20 MG TOTAL) BY MOUTH AT BEDTIME., Disp: 180 capsule, Rfl: 0   gabapentin (NEURONTIN) 300 MG capsule, Take 1 capsule (300 mg total) by mouth 2 (two) times daily., Disp: 180 capsule, Rfl: 3   HYDROcodone-acetaminophen (NORCO/VICODIN) 5-325 MG tablet, Take 1 tablet by mouth 3 (three) times daily as needed for moderate pain (pain score 4-6)., Disp: 270 tablet, Rfl: 0   levocetirizine (XYZAL) 5 MG tablet, TAKE 1 TABLET BY MOUTH EVERY DAY IN THE EVENING, Disp: 90 tablet, Rfl: 2   Melatonin 10 MG TABS, Take 1 tablet by mouth at bedtime. , Disp: , Rfl:    montelukast (SINGULAIR) 10 MG tablet, Take 1 tablet (10 mg total) by mouth daily., Disp: 30 tablet, Rfl: 2   ondansetron (ZOFRAN-ODT) 4 MG disintegrating tablet, Take 1 tablet (4 mg total) by mouth every 8 (eight) hours as needed for nausea or vomiting., Disp: 12 tablet, Rfl: 0   promethazine-dextromethorphan (PROMETHAZINE-DM) 6.25-15 MG/5ML syrup, Take 5 mLs by mouth 3 (three) times daily as needed for cough., Disp: 118 mL, Rfl: 0   rizatriptan (MAXALT-MLT) 10 MG disintegrating tablet, Take 1 tablet (10 mg total) by mouth as needed for migraine. May repeat in 2 hours if needed.  Max of 2 tablets in 24 hours, and 10 tablets per month, Disp: 10 tablet, Rfl: 11   tiZANidine (ZANAFLEX) 2 MG tablet, TAKE UP TO 4 TABLETS BY MOUTH EVERY DAY, Disp: 360 tablet, Rfl: 1   Vitamin D, Ergocalciferol, (DRISDOL) 1.25 MG (50000 UNIT) CAPS capsule, ONE CAPSULE TWICE WEEKLY ON TUESDAYS/FRIDAYS, Disp: 24 capsule, Rfl: 2   Allergies  Allergen Reactions   Diclofenac Anaphylaxis   Lamictal [Lamotrigine] Shortness Of  Breath, Itching and Swelling  Tongue swells   Mobic [Meloxicam] Anaphylaxis   Ultram [Tramadol Hcl] Anaphylaxis   Nefazodone Itching and Swelling    Other reaction(s): Arthralgia (Joint Pain); Throat swelling, mouth sores   Banana    Beef-Derived Drug Products    Betaseron [Interferon Beta-1b] Other (See Comments)    Caused avascular necrosis   Codeine Itching   Copaxone [Glatiramer Acetate] Other (See Comments)    Altered mental status: mental changes, anxiety, motion sickness   Flomax [Tamsulosin Hcl] Itching   Gabapentin Itching    Itching if she takes more than 300mg    Latex     Latex band-aids cause redness and tears your skin   Nitrofurantoin     Causing itching all over, tongue swelling, chest heaviness, swollen lips, cough, rapid HR, swollen eyelids, red eyes/gums, lips red/white per pt.   Oxcarbazepine Itching and Other (See Comments)    Dizziness, throat hurts   Pork-Derived Products    Teriflunomide Diarrhea    Hair loss, joint pains, elevated liver enzymes abugio (joint pains and liver issues)   Trazodone And Nefazodone     Mouth sores, tongue swelling   Tape Rash      The patient states she uses {contraceptive methods:5051} for birth control. Patient's last menstrual period was 04/09/2013.. {Dysmenorrhea-menorrhagia:21918}. Negative for: breast discharge, breast lump(s), breast pain and breast self exam. Associated symptoms include abnormal vaginal bleeding. Pertinent negatives include abnormal bleeding (hematology), anxiety, decreased libido, depression, difficulty falling sleep, dyspareunia, history of infertility, nocturia, sexual dysfunction, sleep disturbances, urinary incontinence, urinary urgency, vaginal discharge and vaginal itching. Diet regular.The patient states her exercise level is    . The patient's tobacco use is:  Social History   Tobacco Use  Smoking Status Former   Current packs/day: 0.00   Average packs/day: 0.3 packs/day for 3.0 years (0.8 ttl  pk-yrs)   Types: Cigarettes   Start date: 06/30/1992   Quit date: 07/01/1995   Years since quitting: 27.8  Smokeless Tobacco Never  . She has been exposed to passive smoke. The patient's alcohol use is:  Social History   Substance and Sexual Activity  Alcohol Use No  . Additional information: Last pap ***, next one scheduled for ***.    Review of Systems  Constitutional: Negative.   HENT: Negative.    Eyes: Negative.   Respiratory: Negative.    Cardiovascular: Negative.   Gastrointestinal: Negative.   Endocrine: Negative.   Genitourinary: Negative.   Musculoskeletal: Negative.   Skin: Negative.   Allergic/Immunologic: Negative.   Neurological: Negative.   Hematological: Negative.   Psychiatric/Behavioral: Negative.       Today's Vitals   05/02/23 1412  BP: (!) 150/100  Pulse: 75  Temp: 98.3 F (36.8 C)  TempSrc: Oral  Weight: 202 lb (91.6 kg)  Height: 5\' 6"  (1.676 m)  PainSc: 5   PainLoc: Back   Body mass index is 32.6 kg/m.  Wt Readings from Last 3 Encounters:  05/02/23 202 lb (91.6 kg)  03/30/23 209 lb 8 oz (95 kg)  03/09/23 211 lb (95.7 kg)    BP Readings from Last 3 Encounters:  05/02/23 (!) 150/100  03/30/23 (!) 149/72  03/09/23 120/84    Objective:  Physical Exam      Assessment And Plan:     Encounter for annual health examination  Mixed hyperlipidemia  Prediabetes  Vitamin D deficiency disease  COVID-19 vaccination declined  Influenza vaccination declined  Herpes zoster vaccination declined     Return for 1 year physical, 6 month  chol check. Patient was given opportunity to ask questions. Patient verbalized understanding of the plan and was able to repeat key elements of the plan. All questions were answered to their satisfaction.     I, Gwynneth Aliment, MD, have reviewed all documentation for this visit. The documentation on 05/02/23 for the exam, diagnosis, procedures, and orders are all accurate and complete.

## 2023-05-05 LAB — CBC WITH DIFFERENTIAL/PLATELET
Basophils Absolute: 0.1 10*3/uL (ref 0.0–0.2)
Basos: 1 %
EOS (ABSOLUTE): 0.2 10*3/uL (ref 0.0–0.4)
Eos: 4 %
Hematocrit: 42.9 % (ref 34.0–46.6)
Hemoglobin: 14.6 g/dL (ref 11.1–15.9)
Immature Grans (Abs): 0 10*3/uL (ref 0.0–0.1)
Immature Granulocytes: 0 %
Lymphocytes Absolute: 1.6 10*3/uL (ref 0.7–3.1)
Lymphs: 29 %
MCH: 32.7 pg (ref 26.6–33.0)
MCHC: 34 g/dL (ref 31.5–35.7)
MCV: 96 fL (ref 79–97)
Monocytes Absolute: 0.5 10*3/uL (ref 0.1–0.9)
Monocytes: 9 %
Neutrophils Absolute: 3.1 10*3/uL (ref 1.4–7.0)
Neutrophils: 57 %
Platelets: 288 10*3/uL (ref 150–450)
RBC: 4.46 x10E6/uL (ref 3.77–5.28)
RDW: 12.6 % (ref 11.7–15.4)
WBC: 5.5 10*3/uL (ref 3.4–10.8)

## 2023-05-05 LAB — VITAMIN D 25 HYDROXY (VIT D DEFICIENCY, FRACTURES): Vit D, 25-Hydroxy: 56.5 ng/mL (ref 30.0–100.0)

## 2023-05-05 LAB — CMP14+EGFR
ALT: 53 [IU]/L — ABNORMAL HIGH (ref 0–32)
AST: 53 [IU]/L — ABNORMAL HIGH (ref 0–40)
Albumin: 4.5 g/dL (ref 3.8–4.9)
Alkaline Phosphatase: 96 [IU]/L (ref 44–121)
BUN/Creatinine Ratio: 16 (ref 12–28)
BUN: 10 mg/dL (ref 8–27)
Bilirubin Total: 0.8 mg/dL (ref 0.0–1.2)
CO2: 23 mmol/L (ref 20–29)
Calcium: 9.6 mg/dL (ref 8.7–10.3)
Chloride: 100 mmol/L (ref 96–106)
Creatinine, Ser: 0.61 mg/dL (ref 0.57–1.00)
Globulin, Total: 2.6 g/dL (ref 1.5–4.5)
Glucose: 86 mg/dL (ref 70–99)
Potassium: 3.8 mmol/L (ref 3.5–5.2)
Sodium: 138 mmol/L (ref 134–144)
Total Protein: 7.1 g/dL (ref 6.0–8.5)
eGFR: 102 mL/min/{1.73_m2} (ref >59)

## 2023-05-05 LAB — MICROALBUMIN / CREATININE URINE RATIO
Creatinine, Urine: 52 mg/dL
Microalb/Creat Ratio: <6 mg/g{creat} (ref 0–29)
Microalbumin, Urine: <3 ug/mL

## 2023-05-05 LAB — HEMOGLOBIN A1C
Est. average glucose Bld gHb Est-mCnc: 120 mg/dL
Hgb A1c MFr Bld: 5.8 % — ABNORMAL HIGH (ref 4.8–5.6)

## 2023-05-05 LAB — VITAMIN B12: Vitamin B-12: 367 pg/mL (ref 232–1245)

## 2023-05-05 LAB — LIPOPROTEIN A (LPA): Lipoprotein (a): 19.7 nmol/L (ref <75.0)

## 2023-05-06 ENCOUNTER — Encounter: Payer: Self-pay | Admitting: Internal Medicine

## 2023-05-12 ENCOUNTER — Encounter: Payer: Self-pay | Admitting: Internal Medicine

## 2023-05-12 DIAGNOSIS — I10 Essential (primary) hypertension: Secondary | ICD-10-CM

## 2023-05-12 DIAGNOSIS — E6609 Other obesity due to excess calories: Secondary | ICD-10-CM | POA: Insufficient documentation

## 2023-05-12 DIAGNOSIS — E66811 Obesity, class 1: Secondary | ICD-10-CM | POA: Insufficient documentation

## 2023-05-12 HISTORY — DX: Essential (primary) hypertension: I10

## 2023-05-12 NOTE — Assessment & Plan Note (Signed)
 New diagnosis. Unable to perform EKG today due to software issue  I will start her on amlodipine  2.5mg  nightly. Discussed possible side effects. She agrees to rto in 2 weeks for NV. An EKG will be performed at that time. I will adjust meds as needed. She is encouraged to follow low sodium diet.

## 2023-05-12 NOTE — Assessment & Plan Note (Signed)
 I WILL CHECK A VIT D LEVEL AND SUPPLEMENT AS NEEDED.  ALSO ENCOURAGED TO SPEND 15 MINUTES IN THE SUN DAILY.

## 2023-05-12 NOTE — Assessment & Plan Note (Signed)
 Chronic, she has been on sertraline in the past.  She does not wish to resume this medication.  She has also been in therapy in the past, she agrees to consider this.

## 2023-05-12 NOTE — Assessment & Plan Note (Signed)
 Chronic, ASCVD risk is 5.5%. she does have family history of heart disease.  I will check lipoprotein (a) levels today.

## 2023-05-12 NOTE — Assessment & Plan Note (Signed)
 Chronic, she is followed by Neurology .  Their input is appreciated, most recent OV reviewed.  She is not on any biologic therapy.

## 2023-05-12 NOTE — Assessment & Plan Note (Signed)
 She is encouraged to strive for BMI less than 30 to decrease cardiac risk. Advised to aim for at least 150 minutes of exercise per week.

## 2023-05-12 NOTE — Assessment & Plan Note (Signed)

## 2023-05-17 ENCOUNTER — Ambulatory Visit: Payer: Medicare Other

## 2023-05-17 ENCOUNTER — Ambulatory Visit: Payer: Self-pay

## 2023-05-24 ENCOUNTER — Ambulatory Visit: Payer: Self-pay

## 2023-05-31 ENCOUNTER — Ambulatory Visit: Payer: Medicare Other

## 2023-05-31 VITALS — BP 122/84 | HR 81 | Temp 98.1°F | Ht 66.0 in | Wt 202.0 lb

## 2023-05-31 DIAGNOSIS — I10 Essential (primary) hypertension: Secondary | ICD-10-CM

## 2023-05-31 NOTE — Patient Instructions (Signed)
Hypertension, Adult Hypertension is another name for high blood pressure. High blood pressure forces your heart to work harder to pump blood. This can cause problems over time. There are two numbers in a blood pressure reading. There is a top number (systolic) over a bottom number (diastolic). It is best to have a blood pressure that is below 120/80. What are the causes? The cause of this condition is not known. Some other conditions can lead to high blood pressure. What increases the risk? Some lifestyle factors can make you more likely to develop high blood pressure: Smoking. Not getting enough exercise or physical activity. Being overweight. Having too much fat, sugar, calories, or salt (sodium) in your diet. Drinking too much alcohol. Other risk factors include: Having any of these conditions: Heart disease. Diabetes. High cholesterol. Kidney disease. Obstructive sleep apnea. Having a family history of high blood pressure and high cholesterol. Age. The risk increases with age. Stress. What are the signs or symptoms? High blood pressure may not cause symptoms. Very high blood pressure (hypertensive crisis) may cause: Headache. Fast or uneven heartbeats (palpitations). Shortness of breath. Nosebleed. Vomiting or feeling like you may vomit (nauseous). Changes in how you see. Very bad chest pain. Feeling dizzy. Seizures. How is this treated? This condition is treated by making healthy lifestyle changes, such as: Eating healthy foods. Exercising more. Drinking less alcohol. Your doctor may prescribe medicine if lifestyle changes do not help enough and if: Your top number is above 130. Your bottom number is above 80. Your personal target blood pressure may vary. Follow these instructions at home: Eating and drinking  If told, follow the DASH eating plan. To follow this plan: Fill one half of your plate at each meal with fruits and vegetables. Fill one fourth of your plate  at each meal with whole grains. Whole grains include whole-wheat pasta, brown rice, and whole-grain bread. Eat or drink low-fat dairy products, such as skim milk or low-fat yogurt. Fill one fourth of your plate at each meal with low-fat (lean) proteins. Low-fat proteins include fish, chicken without skin, eggs, beans, and tofu. Avoid fatty meat, cured and processed meat, or chicken with skin. Avoid pre-made or processed food. Limit the amount of salt in your diet to less than 1,500 mg each day. Do not drink alcohol if: Your doctor tells you not to drink. You are pregnant, may be pregnant, or are planning to become pregnant. If you drink alcohol: Limit how much you have to: 0-1 drink a day for women. 0-2 drinks a day for men. Know how much alcohol is in your drink. In the [place] one drink equals one 12 oz bottle of beer (355 mL), one 5 oz glass of wine (148 mL), or one 1 oz glass of hard liquor (44 mL). Lifestyle  Work with your doctor to stay at a healthy weight or to lose weight. Ask your doctor what the best weight is for you. Get at least 30 minutes of exercise that causes your heart to beat faster (aerobic exercise) most days of the week. This may include walking, swimming, or biking. Get at least 30 minutes of exercise that strengthens your muscles (resistance exercise) at least 3 days a week. This may include lifting weights or doing Pilates. Do not smoke or use any products that contain nicotine or tobacco. If you need help quitting, ask your doctor. Check your blood pressure at home as told by your doctor. Keep all follow-up visits. Medicines Take over-the-counter and prescription medicines   only as told by your doctor. Follow directions carefully. Do not skip doses of blood pressure medicine. The medicine does not work as well if you skip doses. Skipping doses also puts you at risk for problems. Ask your doctor about side effects or reactions to medicines that you should watch  for. Contact a doctor if: You think you are having a reaction to the medicine you are taking. You have headaches that keep coming back. You feel dizzy. You have swelling in your ankles. You have trouble with your vision. Get help right away if: You get a very bad headache. You start to feel mixed up (confused). You feel weak or numb. You feel faint. You have very bad pain in your: Chest. Belly (abdomen). You vomit more than once. You have trouble breathing. These symptoms may be an emergency. Get help right away. Call 911. Do not wait to see if the symptoms will go away. Do not drive yourself to the hospital. Summary Hypertension is another name for high blood pressure. High blood pressure forces your heart to work harder to pump blood. For most people, a normal blood pressure is less than 120/80. Making healthy choices can help lower blood pressure. If your blood pressure does not get lower with healthy choices, you may need to take medicine. This information is not intended to replace advice given to you by your health care provider. Make sure you discuss any questions you have with your health care provider. Document Revised: 02/12/2021 Document Reviewed: 02/12/2021 Elsevier Patient Education  2024 [employer]  

## 2023-05-31 NOTE — Progress Notes (Signed)
Patient presents today for a bpc. Patient reports compliance with meds. She reports she takes amlodipine 2.5mg  at bedtime. Initial bp: 150/90. Second bp taken after 10 minutes. BP Readings from Last 3 Encounters:  05/31/23 122/84  05/02/23 (!) 160/100  03/30/23 (!) 149/72  Per provider patient is to continue with current regimen. Patient aware to follow up at upcoming scheduled appointment.

## 2023-06-09 ENCOUNTER — Other Ambulatory Visit: Payer: Self-pay | Admitting: Internal Medicine

## 2023-06-10 ENCOUNTER — Encounter: Payer: Self-pay | Admitting: Internal Medicine

## 2023-06-10 ENCOUNTER — Other Ambulatory Visit: Payer: Self-pay | Admitting: Family Medicine

## 2023-06-10 DIAGNOSIS — J309 Allergic rhinitis, unspecified: Secondary | ICD-10-CM

## 2023-06-20 ENCOUNTER — Encounter: Payer: Self-pay | Admitting: Internal Medicine

## 2023-07-01 ENCOUNTER — Other Ambulatory Visit: Payer: Self-pay | Admitting: Neurology

## 2023-07-05 MED ORDER — HYDROCODONE-ACETAMINOPHEN 5-325 MG PO TABS
1.0000 | ORAL_TABLET | Freq: Three times a day (TID) | ORAL | 0 refills | Status: DC | PRN
Start: 2023-07-05 — End: 2023-11-03

## 2023-07-05 NOTE — Telephone Encounter (Signed)
 Last seen 03/30/23 and next f/u 08/09/23. Last refilled 03/30/23 #[id number].

## 2023-07-18 ENCOUNTER — Other Ambulatory Visit: Payer: Self-pay | Admitting: Obstetrics and Gynecology

## 2023-07-18 DIAGNOSIS — Z1231 Encounter for screening mammogram for malignant neoplasm of breast: Secondary | ICD-10-CM

## 2023-07-23 ENCOUNTER — Other Ambulatory Visit: Payer: Self-pay | Admitting: Neurology

## 2023-08-08 NOTE — Progress Notes (Unsigned)
 PATIENT: Grace [name] DOB: 04/04/1961  REASON FOR VISIT: follow up HISTORY FROM: patient  No chief complaint on file.  HISTORY OF PRESENT ILLNESS:  08/08/23 ALL: Grace [name] is a 63 y.o. female here today for follow up for MS. She is not currently on DMT. MRI brain and cervical spine 12/2022.    MS seems stable. She continues gabapentin 300mg  daily as needed (has itching with increased doses), Norco 5-325 TID PRN, baclofen 10mg  1-2 times daily, and tizanidine 2mg  up to QID as needed for pain. Baclofen at night to help with RLS. She will take baclofen with tizanidine and feels it works better. Dysesthesias in hands and feet are stable. Gait stable. No assistive device.   She has chronic insomnia. She has difficulty getting to and staying asleep. She sleeps a hour or two at time. She continues doxepin 20mg QHS.   She denies changes in gait, no weakness, no changes in bowel or bladder habits.    HISTORY: (copied from Dr Bonnita Hollow previous note)  Grace [name] is a 63 y.o. woman with relapsing remitting multiple sclerosis.      Update 03/30/2023: She is off a  DMT for MS.  She is neurologically stable.      MRI brain and cervical spine 12/25/2022 was uncahnged   She notes no changes with her gait.  She continues to be mildly off balance.  She needs to use th bannister going downstairs.   She has tingling in her arms and feet..   She feels her hand grip is reduced.   She has numbness in both feet.   NCV/EMG had not shown neuropathy.     She has some muscle spasticity and baclofen/tizandine combo helps more than either by themselves -- takes bid mostly.     She has urinary urgency. She has had some urge incontinence.   She has both hesitancy and urgency.   She had an allergic reaction to Flomax and stopped.  No recent UTI   She continues to have vision problems since cataract surgery and complications with elevated IOP.     She is sleeping poorly most nights - often waking up after a  couple hours sleep and not falling back asleep.   She does take hydrocodone, baclofen, doxepin, melatonin, benadryl at night.  Temazepam helped her fall asleep but not stay asleep.     Ambien had helped her some in the past but she is concerned about side effects    She has anxiety and has had a fe panic attacks.  She has more stress due to her daughter.     Fatigue continues to be a problem.    Concerta had not helped.   She had been on Adderall many years ago and we discussed a retrial.  If it does not help she should stop.   She has pain in the neck, back.  She gets some benefit from gabapentin and hydrocodone.   She reports several recent panic attacks and has had more stress and anxiety with issues involving her daughter   MS History:   She had transverse myelitis in 1998 and was found to have a cervical cord plaque . She had optic neuritis in 1999. In 2002, she had right-sided numbness. MRIs of the brain show just a couple of nonspecific foci. A cervical spine MRI showed a focus consistent with demyelination at C5. Additionally, there are 2 smaller foci at T2 and T3-T4.  CSF was also abnormal c/w MS.  She was initially placed on Betaseron and then on Copaxone. She was unable to tolerate either of these and has not been on any DMT medication since 2003.   She retried Betaseron but stopped after diagnosis of avascular necrosis requiring hip replacement.  She was on Copaxone for a while.   Of note, she had had only one course of IV steroids prior to that and has had some afterwards.   She had not wanted to go on Tysabri or Gilenya and opted to go on no disease modifying therapy since 2012. At her last visit on 01/17/2014, she decided to go on Aubagio but due to elevated liver function test stopped..     Most recent MRIs are from earlier this year and showed no changes.  She has been off of all disease modifying therapy since 2018.   IMAGING 02/05/2018: Brain MRI showed "Multiple T2/FLAIR hyperintense  foci in the hemispheres.  This is a nonspecific finding and could be due to chronic microvascular ischemic changes or 2 demyelination.  None of the foci appears to be acute.  When compared to the MRI dated 07/03/2016, there is no interval change.  There is a normal enhancement pattern.   MRI cervical spine 07/27/2020  three T2 hyperintense foci within the spinal cord, adjacent to C5, C6-C7 and T2.  None of the foci enhance.  The foci adjacent to C5 and T2 were present on the previous MRI.  The focus adjacent to C6-C7 is not seen on the previous MRI from 2016 and is consistent with a more recent demyelinating plaque associated with her MS. 2.   Degenerative changes at C4-C5 through C6-C7 as detailed above.  There is mild spinal stenosis at C5-C6 and borderline spinal stenosis at C6-C7 but there does not appear to be any nerve root compression.  Degenerative changes at C4-C5 and C6-C7 have slightly progressed compared to the previous MRI from 2016.   MRI Brain 05/17/2021 unchanged compared o 02/03/2018   MRi brain 12/25/2022 showed There are a few T2/FLAIR hyperintense foci in the subcortical white matter.  This is a nonspecific finding and could be due to to chronic microvascular ischemic change, demyelination or sequela of migraine.  None of these appear to be acute.  They do not enhance.  They were present on the previous MRI from 05/17/2021.   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   MRI cervical spine 12/25/2022 showed Three T2 hyperintense foci within the spinal cord adjacent to C5, C6-C7 and T2.  The foci do not enhance and are stable compared to the MRI from 2022.   Stable multilevel degenerative changes as detailed above causing mild spinal stenosis at C4-C5 and C5-C6 and borderline spinal stenosis at C6-C7.  The right neuroforamen is narrowed at these levels but not enough to cause nerve root compression.  REVIEW OF SYSTEMS: Out of a  complete 14 system review of symptoms, the patient complains only of the following symptoms, weakness, numbness, headaches, occipital pain and all other reviewed systems are negative.  ALLERGIES: Allergies  Allergen Reactions   Diclofenac Anaphylaxis   Lamictal [Lamotrigine] Shortness Of Breath, Itching and Swelling    Tongue swells   Mobic [Meloxicam] Anaphylaxis   Ultram [Tramadol Hcl] Anaphylaxis   Nefazodone Itching and Swelling    Other reaction(s): Arthralgia (Joint Pain); Throat swelling, mouth sores   Banana    Beef-Derived Drug Products    Betaseron [Interferon Beta-1b] Other (See Comments)  Caused avascular necrosis   Codeine Itching   Copaxone [Glatiramer Acetate] Other (See Comments)    Altered mental status: mental changes, anxiety, motion sickness   Flomax [Tamsulosin Hcl] Itching   Gabapentin Itching    Itching if she takes more than 300mg    Latex     Latex band-aids cause redness and tears your skin   Nitrofurantoin     Causing itching all over, tongue swelling, chest heaviness, swollen lips, cough, rapid HR, swollen eyelids, red eyes/gums, lips red/white per pt.   Oxcarbazepine Itching and Other (See Comments)    Dizziness, throat hurts   Pork-Derived Products    Teriflunomide Diarrhea    Hair loss, joint pains, elevated liver enzymes abugio (joint pains and liver issues)   Trazodone And Nefazodone     Mouth sores, tongue swelling   Tape Rash    HOME MEDICATIONS: Outpatient Medications Prior to Visit  Medication Sig Dispense Refill   Albuterol-Budesonide (AIRSUPRA) 90-80 MCG/ACT AERO Inhale 2 puffs into the lungs every 6 (six) hours as needed. 10.7 g 2   ALPRAZolam (XANAX) 0.5 MG tablet One po every day prn 15 tablet 0   amLODipine (NORVASC) 2.5 MG tablet Take 1 tablet (2.5 mg total) by mouth daily. 30 tablet 11   amphetamine-dextroamphetamine (ADDERALL XR) 30 MG 24 hr capsule Take 1 capsule (30 mg total) by mouth daily. 30 capsule 0   aspirin EC 81 MG  tablet Take 81 mg by mouth daily.     Azelastine HCl 137 MCG/SPRAY SOLN PLACE 1 SPRAY INTO BOTH NOSTRILS 2 (TWO) TIMES DAILY. USE IN EACH NOSTRIL AS DIRECTED 90 mL 1   baclofen (LIORESAL) 10 MG tablet TAKE 2 TABLETS BY MOUTH 3 TIMES A DAY 540 tablet 0   Cholecalciferol (VITAMIN D) 50 MCG (2000 UT) CAPS Take 2,000 Units by mouth daily.     colestipol (COLESTID) 1 g tablet Take 1 tablet (1 g total) by mouth 2 (two) times daily. 60 tablet 11   dicyclomine (BENTYL) 10 MG capsule Take 1 capsule (10 mg total) by mouth 3 (three) times daily before meals. 90 capsule 11   diphenhydrAMINE (BENADRYL) 25 MG tablet Take 25 mg by mouth 3 (three) times daily. Takes with Hydrocodone     doxepin (SINEQUAN) 10 MG capsule TAKE 1-2 CAPSULES (10-20 MG TOTAL) BY MOUTH AT BEDTIME. 180 capsule 0   famotidine (PEPCID) 20 MG tablet Take 1 tablet (20 mg total) by mouth 2 (two) times daily. 180 tablet 1   gabapentin (NEURONTIN) 300 MG capsule Take 1 capsule (300 mg total) by mouth 2 (two) times daily. 180 capsule 3   HYDROcodone-acetaminophen (NORCO/VICODIN) 5-325 MG tablet Take 1 tablet by mouth 3 (three) times daily as needed for moderate pain (pain score 4-6). 270 tablet 0   levocetirizine (XYZAL) 5 MG tablet TAKE 1 TABLET BY MOUTH EVERY DAY IN THE EVENING 90 tablet 2   Melatonin 10 MG TABS Take 1 tablet by mouth at bedtime.      montelukast (SINGULAIR) 10 MG tablet TAKE 1 TABLET BY MOUTH EVERY DAY 90 tablet 2   ondansetron (ZOFRAN-ODT) 4 MG disintegrating tablet Take 1 tablet (4 mg total) by mouth every 8 (eight) hours as needed for nausea or vomiting. 12 tablet 0   promethazine-dextromethorphan (PROMETHAZINE-DM) 6.25-15 MG/5ML syrup Take 5 mLs by mouth 3 (three) times daily as needed for cough. 118 mL 0   rizatriptan (MAXALT-MLT) 10 MG disintegrating tablet Take 1 tablet (10 mg total) by mouth as needed for migraine.  May repeat in 2 hours if needed.  Max of 2 tablets in 24 hours, and 10 tablets per month 10 tablet 11    tiZANidine (ZANAFLEX) 2 MG tablet TAKE UP TO 4 TABLETS BY MOUTH EVERY DAY 360 tablet 1   Vitamin D, Ergocalciferol, (DRISDOL) 1.25 MG (50000 UNIT) CAPS capsule ONE CAPSULE TWICE WEEKLY ON TUESDAYS/FRIDAYS 24 capsule 2   No facility-administered medications prior to visit.    PAST MEDICAL HISTORY: Past Medical History:  Diagnosis Date   Allergy    Anxiety    Avascular necrosis (HCC)    Back pain    Cancer (HCC)    Cataract    Had surgery   Chest pain    Chewing difficulty    Chronic fatigue syndrome    Colon polyp    pre-cancerous   Complication of anesthesia    patient states she requires more anesthesia   Depression    Disorder of soft tissue    Diverticulosis    Dizziness    Edema, lower extremity    Encounter for annual health examination 05/02/2023   Essential hypertension, benign 05/12/2023   Fatty liver    Fibromyalgia    GERD (gastroesophageal reflux disease)    Glaucoma 07-2019   From surgery   H/O breast biopsy    pre- cancerous cells   Hand fracture, left    9/87   Heart palpitations    Heart valve disease    History of Holter monitoring    Hyperlipidemia    Insomnia    Joint pain    Lactose intolerance    Lhermitte's syndrome    Migraines    Multiple food allergies    Multiple sclerosis (HCC)    dx in 11/1996   Multiple sclerosis (HCC)    Neuromuscular disorder (HCC)    Neuropathy    Numbness and tingling in hands    Obesity    Optic neuritis    OSA (obstructive sleep apnea)    no longer have OSA since stopped taking a MS medication   Osteoarthritis    Otitis media    Palpitations    Prediabetes    Raynaud's phenomenon    Shortness of breath    Sleep apnea    Due to medication that she no longer takes.   Swallowing difficulty    Vaginitis and vulvovaginitis    Vitamin B12 deficiency    Vitamin D deficiency     PAST SURGICAL HISTORY: Past Surgical History:  Procedure Laterality Date   ABDOMINAL HYSTERECTOMY Bilateral 02/12/2016    Procedure: HYSTERECTOMY ABDOMINAL WITH BILATERAL SALPINGO OOPHERECTOMY;  Surgeon: Marcelle Overlie, MD;  Location: [facility];  Service: Gynecology;  Laterality: Bilateral;   BREAST EXCISIONAL BIOPSY     core/left breast   CATARACT EXTRACTION, BILATERAL  06/2019   CHOLECYSTECTOMY  1983   COLONOSCOPY     1997/2001   DILATION AND CURETTAGE OF UTERUS     ENDOMETRIAL ABLATION  2012   EXTRACORPOREAL SHOCK WAVE LITHOTRIPSY Left 05/24/2022   Procedure: LEFT EXTRACORPOREAL SHOCK WAVE LITHOTRIPSY (ESWL);  Surgeon: Crist Fat, MD;  Location: [facility];  Service: Urology;  Laterality: Left;   EYE SURGERY     JOINT REPLACEMENT     LAPAROSCOPIC ABDOMINAL EXPLORATION     ovaries and intestines bond together   LEFT HEART CATHETERIZATION WITH CORONARY ANGIOGRAM N/A 06/21/2011   Procedure: LEFT HEART CATHETERIZATION WITH CORONARY ANGIOGRAM;  Surgeon: Marykay Lex, MD;  Location: [facility] CATH LAB;  Service:  Cardiovascular;  Laterality: N/A;   LUMBAR PUNCTURE     11/01/1996   SALPINGOOPHORECTOMY Bilateral 02/12/2016   Procedure: BILATERAL SALPINGO OOPHORECTOMY;  Surgeon: Marcelle Overlie, MD;  Location: [facility];  Service: Gynecology;  Laterality: Bilateral;   TOTAL HIP ARTHROPLASTY     left hip  6/02  /right hip12/04    FAMILY HISTORY: Family History  Problem Relation Age of Onset   Pancreatic cancer Mother    Stroke Mother    Hypertension Mother    Hyperlipidemia Mother    Heart disease Mother    Cancer Mother    Depression Mother    Anxiety disorder Mother    Alcoholism Mother    Alcohol abuse Mother    Arthritis Mother    Testicular cancer Brother    Cancer Brother    Colon cancer Maternal Uncle    Pancreatic cancer Paternal Uncle    Prostate cancer Maternal Grandfather    Colon polyps Father    Diabetes Father    Hypertension Father    Hyperlipidemia Father    Kidney disease Father    Sleep apnea Father    Obesity Father    Arthritis Father    Asthma Father     COPD Father    Heart disease Father    Asthma Paternal Grandfather    COPD Paternal Grandfather    Heart disease Paternal Grandfather    Cancer Paternal Grandmother    ADD / ADHD Son    Anxiety disorder Son    Hypertension Son    Cancer Maternal Uncle    Cancer Paternal Uncle    Hypertension Daughter     SOCIAL HISTORY: Social History   Socioeconomic History   Marital status: Widowed    Spouse name: Not on file   Number of children: Not on file   Years of education: Not on file   Highest education level: 12th grade  Occupational History   Occupation: disabled  Tobacco Use   Smoking status: Former    Current packs/day: 0.00    Average packs/day: 0.3 packs/day for 3.0 years (0.8 ttl pk-yrs)    Types: Cigarettes    Start date: 06/30/1992    Quit date: 07/01/1995    Years since quitting: 28.1   Smokeless tobacco: Never  Vaping Use   Vaping status: Never Used  Substance and Sexual Activity   Alcohol use: No   Drug use: Yes    Types: Hydrocodone   Sexual activity: Not Currently    Birth control/protection: Abstinence, Post-menopausal, Surgical  Other Topics Concern   Not on file  Social History Narrative   Not on file   Social Drivers of Health   Financial Resource Strain: Low Risk  (10/27/2022)   Overall Financial Resource Strain (CARDIA)    Difficulty of Paying Living Expenses: Not hard at all  Food Insecurity: Low Risk  (12/22/2022)   Received from [facility]   Hunger Vital Sign    Worried About Running Out of Food in the Last Year: Never true    Ran Out of Food in the Last Year: Never true  Recent Concern: Food Insecurity - Food Insecurity Present (10/18/2022)   Hunger Vital Sign    Worried About Running Out of Food in the Last Year: Sometimes true    Ran Out of Food in the Last Year: Sometimes true  Transportation Needs: Not on file (12/22/2022)  Physical Activity: Insufficiently Active (10/27/2022)   Exercise Vital Sign    Days of Exercise per Week: 2  days     Minutes of Exercise per Session: 60 min  Stress: No Stress Concern Present (10/27/2022)   [employer] of Occupational Health - Occupational Stress Questionnaire    Feeling of Stress : Only a little  Social Connections: Moderately Integrated (10/27/2022)   Social Connection and Isolation Panel [NHANES]    Frequency of Communication with Friends and Family: Three times a week    Frequency of Social Gatherings with Friends and Family: Once a week    Attends Religious Services: 1 to 4 times per year    Active Member of [employer] or Organizations: Yes    Attends Banker Meetings: Not on file    Marital Status: Widowed  Recent Concern: Social Connections - Moderately Isolated (10/18/2022)   Social Connection and Isolation Panel [NHANES]    Frequency of Communication with Friends and Family: More than three times a week    Frequency of Social Gatherings with Friends and Family: Twice a week    Attends Religious Services: 1 to 4 times per year    Active Member of [employer] or Organizations: No    Attends Banker Meetings: Not on file    Marital Status: Widowed  Intimate Partner Violence: Not At Risk (10/28/2022)   Humiliation, Afraid, Rape, and Kick questionnaire    Fear of Current or Ex-Partner: No    Emotionally Abused: No    Physically Abused: No    Sexually Abused: No      PHYSICAL EXAM  There were no vitals filed for this visit.     There is no height or weight on file to calculate BMI.  Generalized: Well developed, in no acute distress  Cardiology: normal rate and rhythm, no murmur noted Neurological examination  Mentation: Alert oriented to time, place, history taking. Follows all commands speech and language fluent Cranial nerve II-XII: Pupils were equal round reactive to light. Extraocular movements were full, visual field were full on confrontational test. Facial sensation and strength were normal. Head turning and shoulder shrug  were normal and  symmetric. Motor: The motor testing reveals 5 over 5 strength of all 4 extremities. Good symmetric motor tone is noted throughout.  Sensory: Sensory testing is intact to soft touch on all 4 extremities. No evidence of extinction is noted.  Coordination: Cerebellar testing reveals good finger-nose-finger and heel-to-shin not performed due to being in boot.  Gait and station: Gait is normal.    DIAGNOSTIC DATA (LABS, IMAGING, TESTING) - I reviewed patient records, labs, notes, testing and imaging myself where available.      No data to display           Lab Results  Component Value Date   WBC 5.5 05/02/2023   HGB 14.6 05/02/2023   HCT 42.9 05/02/2023   MCV 96 05/02/2023   PLT 288 05/02/2023      Component Value Date/Time   NA 138 05/02/2023 1513   K 3.8 05/02/2023 1513   CL 100 05/02/2023 1513   CO2 23 05/02/2023 1513   GLUCOSE 86 05/02/2023 1513   GLUCOSE 103 (H) 05/20/2022 1612   BUN 10 05/02/2023 1513   CREATININE 0.61 05/02/2023 1513   CALCIUM 9.6 05/02/2023 1513   PROT 7.1 05/02/2023 1513   ALBUMIN 4.5 05/02/2023 1513   AST 53 (H) 05/02/2023 1513   ALT 53 (H) 05/02/2023 1513   ALKPHOS 96 05/02/2023 1513   BILITOT 0.8 05/02/2023 1513   GFRNONAA 57 (L) 05/20/2022 1612   GFRAA 110 04/16/2020  1635   Lab Results  Component Value Date   CHOL 154 10/19/2022   HDL 57 10/19/2022   LDLCALC 71 10/19/2022   TRIG 154 (H) 10/19/2022   CHOLHDL 2.7 10/19/2022   Lab Results  Component Value Date   HGBA1C 5.8 (H) 05/02/2023   Lab Results  Component Value Date   VITAMINB12 367 05/02/2023   Lab Results  Component Value Date   TSH 1.530 05/20/2021     ASSESSMENT AND PLAN 63 y.o. year old female  has a past medical history of Allergy, Anxiety, Avascular necrosis (HCC), Back pain, Cancer (HCC), Cataract, Chest pain, Chewing difficulty, Chronic fatigue syndrome, Colon polyp, Complication of anesthesia, Depression, Disorder of soft tissue, Diverticulosis, Dizziness,  Edema, lower extremity, Encounter for annual health examination (05/02/2023), Essential hypertension, benign (05/12/2023), Fatty liver, Fibromyalgia, GERD (gastroesophageal reflux disease), Glaucoma (07-2019), H/O breast biopsy, Hand fracture, left, Heart palpitations, Heart valve disease, History of Holter monitoring, Hyperlipidemia, Insomnia, Joint pain, Lactose intolerance, Lhermitte's syndrome, Migraines, Multiple food allergies, Multiple sclerosis (HCC), Multiple sclerosis (HCC), Neuromuscular disorder (HCC), Neuropathy, Numbness and tingling in hands, Obesity, Optic neuritis, OSA (obstructive sleep apnea), Osteoarthritis, Otitis media, Palpitations, Prediabetes, Raynaud's phenomenon, Shortness of breath, Sleep apnea, Swallowing difficulty, Vaginitis and vulvovaginitis, Vitamin B12 deficiency, and Vitamin D deficiency. here with   No diagnosis found.   Tayli has been doing well. She will continue rizatriptan, baclofen, hydrocodone, tizanidine, gabapentin, and doxepin. She denies need for refills at this time. PDMP shows appropriate refills and no other controlled substances being prescribed. She was encouraged to continue regular activity and stay well hydrated. She will follow up in 4 months with Dr Epimenio Foot, sooner if needed.    No orders of the defined types were placed in this encounter.     No orders of the defined types were placed in this encounter.      Shawnie Dapper, FNP-C 08/08/2023, 7:02 PM [facility] [address], [address] [place], [place] [address] [phone]

## 2023-08-08 NOTE — Patient Instructions (Signed)

## 2023-08-09 ENCOUNTER — Encounter: Payer: Self-pay | Admitting: Family Medicine

## 2023-08-09 ENCOUNTER — Ambulatory Visit (INDEPENDENT_AMBULATORY_CARE_PROVIDER_SITE_OTHER): Payer: Medicare Other | Admitting: Family Medicine

## 2023-08-09 VITALS — BP 143/89 | HR 78 | Ht 66.0 in | Wt 199.0 lb

## 2023-08-09 DIAGNOSIS — G35 Multiple sclerosis: Secondary | ICD-10-CM

## 2023-08-09 DIAGNOSIS — Z79891 Long term (current) use of opiate analgesic: Secondary | ICD-10-CM | POA: Diagnosis not present

## 2023-08-09 DIAGNOSIS — R208 Other disturbances of skin sensation: Secondary | ICD-10-CM

## 2023-08-09 DIAGNOSIS — G4700 Insomnia, unspecified: Secondary | ICD-10-CM

## 2023-08-09 DIAGNOSIS — F418 Other specified anxiety disorders: Secondary | ICD-10-CM

## 2023-08-09 DIAGNOSIS — G43009 Migraine without aura, not intractable, without status migrainosus: Secondary | ICD-10-CM

## 2023-08-09 MED ORDER — TIZANIDINE HCL 2 MG PO TABS
ORAL_TABLET | ORAL | 1 refills | Status: AC
Start: 2023-08-09 — End: ?

## 2023-08-09 MED ORDER — DOXEPIN HCL 10 MG PO CAPS: 10.0000 mg | ORAL_CAPSULE | Freq: Every day | ORAL | 0 refills | Status: DC

## 2023-08-31 ENCOUNTER — Ambulatory Visit
Admission: RE | Admit: 2023-08-31 | Discharge: 2023-08-31 | Disposition: A | Discharge status: Home / Self Care | Source: Ambulatory Visit | Attending: Obstetrics and Gynecology | Admitting: Obstetrics and Gynecology

## 2023-08-31 DIAGNOSIS — Z1231 Encounter for screening mammogram for malignant neoplasm of breast: Secondary | ICD-10-CM

## 2023-09-05 ENCOUNTER — Other Ambulatory Visit: Payer: Self-pay | Admitting: Obstetrics and Gynecology

## 2023-09-05 DIAGNOSIS — R928 Other abnormal and inconclusive findings on diagnostic imaging of breast: Secondary | ICD-10-CM

## 2023-09-06 ENCOUNTER — Encounter: Payer: Self-pay | Admitting: Physician Assistant

## 2023-09-06 ENCOUNTER — Ambulatory Visit (INDEPENDENT_AMBULATORY_CARE_PROVIDER_SITE_OTHER): Admitting: Physician Assistant

## 2023-09-06 ENCOUNTER — Ambulatory Visit (INDEPENDENT_AMBULATORY_CARE_PROVIDER_SITE_OTHER)
Admission: RE | Admit: 2023-09-06 | Discharge: 2023-09-06 | Disposition: A | Discharge status: Home / Self Care | Source: Ambulatory Visit | Attending: Physician Assistant | Admitting: Physician Assistant

## 2023-09-06 VITALS — BP 118/78 | HR 65 | Ht 66.0 in | Wt 197.5 lb

## 2023-09-06 DIAGNOSIS — R197 Diarrhea, unspecified: Secondary | ICD-10-CM

## 2023-09-06 DIAGNOSIS — G35 Multiple sclerosis: Secondary | ICD-10-CM

## 2023-09-06 DIAGNOSIS — K7401 Hepatic fibrosis, early fibrosis: Secondary | ICD-10-CM | POA: Diagnosis not present

## 2023-09-06 DIAGNOSIS — K7581 Nonalcoholic steatohepatitis (NASH): Secondary | ICD-10-CM | POA: Diagnosis not present

## 2023-09-06 DIAGNOSIS — K219 Gastro-esophageal reflux disease without esophagitis: Secondary | ICD-10-CM

## 2023-09-06 DIAGNOSIS — Z9049 Acquired absence of other specified parts of digestive tract: Secondary | ICD-10-CM

## 2023-09-06 DIAGNOSIS — Z860101 Personal history of adenomatous and serrated colon polyps: Secondary | ICD-10-CM

## 2023-09-06 DIAGNOSIS — K529 Noninfective gastroenteritis and colitis, unspecified: Secondary | ICD-10-CM

## 2023-09-06 DIAGNOSIS — Z91014 Allergy to mammalian meats: Secondary | ICD-10-CM

## 2023-09-06 MED ORDER — BISACODYL EC 5 MG PO TBEC: 20.0000 mg | DELAYED_RELEASE_TABLET | ORAL | 0 refills | Status: DC

## 2023-09-06 MED ORDER — POLYETHYLENE GLYCOL 3350 17 GM/SCOOP PO POWD: 238.0000 g | ORAL | 0 refills | Status: DC

## 2023-09-06 NOTE — Progress Notes (Addendum)
 09/06/2023 Grace [name] [id number] Jun 23, 1960  Referring provider: Cleave Curling, MD Primary GI doctor: Dr. Yvone [name] (Dr. Sandrea [name])  ASSESSMENT AND PLAN:  Diarrhea x 20 years, worse with any food, worse salads, not fatty foods No hematochezia, has had intentional weight loss s/p cholecystectomy 05/2018 Colon diverticula, otherwise normal, recall 5 years 05/20/2022 CT and pelvis without contrast severe diffuse hepatic steatosis diverticulosis without acute diverticulitis On colestipol  1 g p.o. only on it once daily and dicyclomine  10 mg twice a day -Patient on hydrocodone  due to Grace [name], check KUB for constipation/ IBS mixed - will schedule for colonoscopy 2 day prep to evaluate, rule out microscopic colitis, has history of polyps, We have discussed the risks of bleeding, infection, perforation, medication reactions, and remote risk of death associated with colonoscopy. All questions were answered and the patient acknowledges these risk and wishes to proceed. -consider xifaxin if colon negative -possible component of pelvis floor dysfunction with history and symptoms.  -Will treat with miralax/fiber, squatty potty. 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 Given more information  GERD Can have some nocturnal symptoms, On pepcid  Some minor symptoms, has improved with weight loss, no dysphagia  MASH and F2 fibrosis    Latest Ref Rng & Units 05/02/2023    3:13 PM 08/23/2022   10:23 AM 07/21/2022    4:50 PM  Hepatic Function  Total Protein 6.0 - 8.5 g/dL 7.1  7.3  7.4   Albumin 3.8 - 4.9 g/dL 4.5  4.3  4.3   AST 0 - 40 IU/L 53  87  71   ALT 0 - 32 IU/L 53  78  79   Alk Phosphatase 44 - 121 IU/L 96  126  106   Total Bilirubin 0.0 - 1.2 mg/dL 0.8  0.4  0.6   Bilirubin, Direct 0.0 - 0.3 mg/dL  0.1  0.1   Platelets 865  Serological evaluation negative and weakly positive ANA  12/22/2022 FibroScan showed F1-F2 hepatic  fibrosis and S3 hepatic steatosis [facility]s with [facility], Grace Grace [name] at [facility] 07/05/2023 - encouraged to continue weight loss and follow up with [facility]  Grace [name] Not on medications at this time Has some imbalance/pain  I have reviewed the clinic note as outlined by Grace Cull, PA and agree with the assessment, plan and medical decision making.  Grace [name]. Mane presents for evaluation and management of a 20+ year history of chronic diarrhea that worsened after cholecystectomy.  Noted to have alpha gal allergy and avoids associated food products.  Last colonoscopy in 2020 was normal.  KUB today shows moderate stool burden raising possibility of constipation with overflow encopresis.  Will proceed with colonoscopy with biopsies to rule out microscopic colitis.  If this is negative can consider management of potential encopresis and consideration of SIBO treatment.  Continue to monitor liver enzymes in the setting of MASH.  Grace Hess, MD   Patient Care Team: Grace Curling, MD as PCP - General (Internal Medicine) [name], Grace Dimmer, MD (Neurology) Grace [name], [facility] (Inactive) (Pharmacist)  HISTORY OF PRESENT ILLNESS: 63 y.o. female with a past medical history listed below presents for evaluation of abdominal pain and discuss colonoscopy.  Previously known to Dr. Sandrea [name] and last seen in the office 08/26/2022 for manifold and diarrhea.  Discussed the use of AI scribe software for clinical note transcription with the patient, who gave verbal consent to proceed.  History of Present Illness  Grace [name] is a 63 year old female with hepatic fibrosis and steatosis who presents with chronic diarrhea. Referred by Dr. Sandrea [name] for further evaluation of gastrointestinal symptoms.  She has experienced chronic diarrhea for the past 20 years, with episodes occurring postprandially regardless of food type. The frequency varies, with some weeks involving diarrhea after every meal and other weeks with no  bowel movements for two days. The diarrhea is occasionally oily and floats. She avoids eating when going out to prevent frequent bathroom visits. Her symptoms have worsened over the last 20 years since her cholecystectomy at age 18-18. No hematochezia, fevers, or chills.  She has a known allergy to beef and pork, causing severe abdominal pain, and has tested positive for alpha-gal allergy. She also discovered an allergy to bananas, which exacerbates leg cramps. Her diet primarily consists of chicken and [place].  She experiences heartburn, particularly when sleeping on her right side, and takes famotidine  for relief. She has been intentionally losing weight, which has been beneficial for her condition.  She is currently on colestipol , one gram once daily, and dicyclomine  twice daily before meals, but reports no significant change in her symptoms. She occasionally forgets to take her medications on time.  She has a history of multiple sclerosis diagnosed in 1997, which causes urinary incontinence and affects her mobility. She takes hydrocodone  once nightly for pain management, with occasional increased use during severe pain episodes. She has experienced adverse effects from previous Grace [name] medications, including mental disturbances and worsened cataracts.  Her family history includes her father having polyps and her mother having pancreatic cancer. She is prediabetic but not on medication for it. She has had a complete hysterectomy and her gynecologist noted a slight bladder prolapse. She does not consume alcohol regularly and has a history of smoking cessation.        She  reports that she quit smoking about 28 years ago. Her smoking use included cigarettes. She started smoking about 31 years ago. She has a 0.8 pack-year smoking history. She has never used smokeless tobacco. She reports current drug use. Drug: Hydrocodone . She reports that she does not drink alcohol.  RELEVANT GI HISTORY, IMAGING AND  LABS: Results   RADIOLOGY Abdominal CT: No significant findings (05/2022)  DIAGNOSTIC FibroScan: F1-F2 hepatic fibrosis, hepatic steatosis (06/2023)     IMAGING: 05/20/2022 CT abdomen without contrast Hepatobiliary: Severe diffuse hepatic steatosis. Gallbladder surgically absent. Similar prominence of the extrahepatic biliary tree with the common duct measuring 11 mm and gentle tapering of the duct to the level of the ampulla compatible with reservoir effect post cholecystectomy.  03/17/2022 EXAM: MRI ABDOMEN WITHOUT AND WITH CONTRAST (INCLUDING MRCP)  TECHNIQUE: Multiplanar multisequence MR imaging of the abdomen was performed both before and after the administration of intravenous contrast. Heavily T2-weighted images of the biliary and pancreatic ducts were obtained, and three-dimensional MRCP images were rendered by post processing.  CONTRAST: 10mL GADAVIST  GADOBUTROL  1 MMOL/ML IV SOLN  COMPARISON: Multiple priors including ultrasound January 28, 2022 and CT June 20, 2018.  FINDINGS: Lower chest: No acute abnormality.  Hepatobiliary: Severe diffuse hepatic steatosis. No suspicious hepatic lesion.  Gallbladder surgically absent. Prominence of the extrahepatic biliary tree measuring 12 mm on image 14/4 is similar dating back to CT abdomen pelvis June 20, 2018 when it measured 11 mm. There is gentle tapering of the duct to the level of the ampulla without choledocholithiasis or extrinsic compression of the duct.  Pancreas: No pancreatic ductal dilation or evidence of acute  inflammation. No cystic or solid hyperenhancing pancreatic lesion identified. No pancreatic divisum.  Spleen: No splenomegaly or focal splenic lesion.  Adrenals/Urinary Tract: Bilateral adrenal glands appear normal. No hydronephrosis. Kidneys demonstrate symmetric enhancement. No solid enhancing renal mass.  Stomach/Bowel: Colonic diverticulosis without findings of  acute diverticulitis.  Vascular/Lymphatic: Normal caliber abdominal aorta. Smooth IVC contours. Portal, splenic and superior mesenteric veins are patent. Prominent upper abdominal lymph nodes measure up to 8 mm in the aortocaval station on image 37/17.  Other: No significant abdominal free fluid.  Musculoskeletal: No suspicious bone lesions identified.  IMPRESSION: 1. Prominence of the extrahepatic biliary tree is similar dating back to CT abdomen pelvis June 20, 2018 when it measured 11 mm, with gentle tapering of the duct to the level of the ampulla without choledocholithiasis or extrinsic compression of the duct. Findings most compatible with reservoir effect post cholecystectomy. 2. Severe diffuse hepatic steatosis. 3. Colonic diverticulosis without findings of acute diverticulitis.  CBC    Component Value Date/Time   WBC 5.5 05/02/2023 1513   WBC 11.3 (H) 05/20/2022 1612   RBC 4.46 05/02/2023 1513   RBC 4.35 05/20/2022 1612   HGB 14.6 05/02/2023 1513   HCT 42.9 05/02/2023 1513   PLT 288 05/02/2023 1513   MCV 96 05/02/2023 1513   MCH 32.7 05/02/2023 1513   MCH 32.2 05/20/2022 1612   MCHC 34.0 05/02/2023 1513   MCHC 33.6 05/20/2022 1612   RDW 12.6 05/02/2023 1513   LYMPHSABS 1.6 05/02/2023 1513   MONOABS 1.1 (H) 05/20/2022 1612   EOSABS 0.2 05/02/2023 1513   BASOSABS 0.1 05/02/2023 1513   Recent Labs    05/02/23 1513  HGB 14.6    CMP     Component Value Date/Time   NA 138 05/02/2023 1513   K 3.8 05/02/2023 1513   CL 100 05/02/2023 1513   CO2 23 05/02/2023 1513   GLUCOSE 86 05/02/2023 1513   GLUCOSE 103 (H) 05/20/2022 1612   BUN 10 05/02/2023 1513   CREATININE 0.61 05/02/2023 1513   CALCIUM 9.6 05/02/2023 1513   PROT 7.1 05/02/2023 1513   ALBUMIN 4.5 05/02/2023 1513   AST 53 (H) 05/02/2023 1513   ALT 53 (H) 05/02/2023 1513   ALKPHOS 96 05/02/2023 1513   BILITOT 0.8 05/02/2023 1513   GFRNONAA 57 (L) 05/20/2022 1612   GFRAA 110 04/16/2020 1635       Latest Ref Rng & Units 05/02/2023    3:13 PM 08/23/2022   10:23 AM 07/21/2022    4:50 PM  Hepatic Function  Total Protein 6.0 - 8.5 g/dL 7.1  7.3  7.4   Albumin 3.8 - 4.9 g/dL 4.5  4.3  4.3   AST 0 - 40 IU/L 53  87  71   ALT 0 - 32 IU/L 53  78  79   Alk Phosphatase 44 - 121 IU/L 96  126  106   Total Bilirubin 0.0 - 1.2 mg/dL 0.8  0.4  0.6   Bilirubin, Direct 0.0 - 0.3 mg/dL  0.1  0.1       Current Medications:    Current Outpatient Medications (Cardiovascular):    amLODipine  (NORVASC ) 2.5 MG tablet, Take 1 tablet (2.5 mg total) by mouth daily.   colestipol  (COLESTID ) 1 g tablet, Take 1 tablet (1 g total) by mouth 2 (two) times daily.  Current Outpatient Medications (Respiratory):    Albuterol-Budesonide (AIRSUPRA ) 90-80 MCG/ACT AERO, Inhale 2 puffs into the lungs every 6 (six) hours as needed.   Azelastine   HCl 137 MCG/SPRAY SOLN, PLACE 1 SPRAY INTO BOTH NOSTRILS 2 (TWO) TIMES DAILY. USE IN EACH NOSTRIL AS DIRECTED   diphenhydrAMINE  (BENADRYL ) 25 MG tablet, Take 25 mg by mouth 3 (three) times daily. Takes with Hydrocodone    levocetirizine (XYZAL ) 5 MG tablet, TAKE 1 TABLET BY MOUTH EVERY DAY IN THE EVENING   montelukast  (SINGULAIR ) 10 MG tablet, TAKE 1 TABLET BY MOUTH EVERY DAY  Current Outpatient Medications (Analgesics):    aspirin  EC 81 MG tablet, Take 81 mg by mouth daily.   HYDROcodone -acetaminophen  (NORCO/VICODIN) 5-325 MG tablet, Take 1 tablet by mouth 3 (three) times daily as needed for moderate pain (pain score 4-6).   rizatriptan  (MAXALT -MLT) 10 MG disintegrating tablet, Take 1 tablet (10 mg total) by mouth as needed for migraine. May repeat in 2 hours if needed.  Max of 2 tablets in 24 hours, and 10 tablets per month   Current Outpatient Medications (Other):    ALPRAZolam  (XANAX ) 0.5 MG tablet, One po every day prn   amphetamine -dextroamphetamine (ADDERALL XR) 30 MG 24 hr capsule, Take 1 capsule (30 mg total) by mouth daily.   baclofen  (LIORESAL ) 10 MG tablet,  TAKE 2 TABLETS BY MOUTH 3 TIMES A DAY   bisacodyl 5 MG EC tablet, Take 4 tablets (20 mg total) by mouth as directed.   Cholecalciferol (VITAMIN D ) 50 MCG (2000 UT) CAPS, Take 2,000 Units by mouth daily.   dicyclomine  (BENTYL ) 10 MG capsule, Take 1 capsule (10 mg total) by mouth 3 (three) times daily before meals.   doxepin  (SINEQUAN ) 10 MG capsule, Take 1-2 capsules (10-20 mg total) by mouth at bedtime.   famotidine  (PEPCID ) 20 MG tablet, Take 1 tablet (20 mg total) by mouth 2 (two) times daily.   gabapentin  (NEURONTIN ) 300 MG capsule, Take 1 capsule (300 mg total) by mouth 2 (two) times daily.   Melatonin 10 MG TABS, Take 1 tablet by mouth at bedtime.    ondansetron  (ZOFRAN -ODT) 4 MG disintegrating tablet, Take 1 tablet (4 mg total) by mouth every 8 (eight) hours as needed for nausea or vomiting.   polyethylene glycol powder (GLYCOLAX/MIRALAX) 17 GM/SCOOP powder, Take 238 g by mouth as directed.   tiZANidine  (ZANAFLEX ) 2 MG tablet, TAKE UP TO 4 TABLETS BY MOUTH EVERY DAY   Vitamin D , Ergocalciferol , (DRISDOL ) 1.25 MG (50000 UNIT) CAPS capsule, ONE CAPSULE TWICE WEEKLY ON TUESDAYS/FRIDAYS  Medical History:  Past Medical History:  Diagnosis Date   Allergy    Anxiety    Avascular necrosis (HCC)    Back pain    Cancer (HCC)    Cataract    Had surgery   Chest pain    Chewing difficulty    Chronic fatigue syndrome    Colon polyp    pre-cancerous   Complication of anesthesia    patient states she requires more anesthesia   Depression    Disorder of soft tissue    Diverticulosis    Dizziness    Edema, lower extremity    Encounter for annual health examination 05/02/2023   Essential hypertension, benign 05/12/2023   Fatty liver    Fibromyalgia    GERD (gastroesophageal reflux disease)    Glaucoma 07-2019   From surgery   H/O breast biopsy    pre- cancerous cells   Hand fracture, left    9/87   Heart palpitations    Heart valve disease    History of Holter monitoring     Hyperlipidemia    Insomnia    Joint  pain    Lactose intolerance    Lhermitte's syndrome    Migraines    Multiple food allergies    Multiple sclerosis (HCC)    dx in 11/1996   Multiple sclerosis (HCC)    Neuromuscular disorder (HCC)    Neuropathy    Numbness and tingling in hands    Obesity    Optic neuritis    OSA (obstructive sleep apnea)    no longer have OSA since stopped taking a Grace [name] medication   Osteoarthritis    Otitis media    Palpitations    Prediabetes    Raynaud's phenomenon    Shortness of breath    Sleep apnea    Due to medication that she no longer takes.   Swallowing difficulty    Vaginitis and vulvovaginitis    Vitamin B12 deficiency    Vitamin D  deficiency    Allergies:  Allergies  Allergen Reactions   Diclofenac Anaphylaxis   Lamictal [Lamotrigine] Shortness Of Breath, Itching and Swelling    Tongue swells   Mobic [Meloxicam] Anaphylaxis   Ultram  [Tramadol  Hcl] Anaphylaxis   Nefazodone Itching and Swelling    Other reaction(s): Arthralgia (Joint Pain); Throat swelling, mouth sores   Banana    Beef-Derived Drug Products    Betaseron [Interferon Beta-1b] Other (See Comments)    Caused avascular necrosis   Codeine Itching   Copaxone [Glatiramer Acetate] Other (See Comments)    Altered mental status: mental changes, anxiety, motion sickness   Flomax  [Tamsulosin  Hcl] Itching   Gabapentin  Itching    Itching if she takes more than 300mg    Latex     Latex band-aids cause redness and tears your skin   Nitrofurantoin      Causing itching all over, tongue swelling, chest heaviness, swollen lips, cough, rapid HR, swollen eyelids, red eyes/gums, lips red/white per pt.   Oxcarbazepine  Itching and Other (See Comments)    Dizziness, throat hurts   Pork-Derived Products    Teriflunomide Diarrhea    Hair loss, joint pains, elevated liver enzymes abugio (joint pains and liver issues)   Trazodone  And Nefazodone     Mouth sores, tongue swelling   Tape Rash      Surgical History:  She  has a past surgical history that includes Cholecystectomy (9371); Colonoscopy; Laparoscopic abdominal exploration; Lumbar puncture; Total hip arthroplasty; left heart catheterization with coronary angiogram (N/A, 06/21/2011); Dilation and curettage of uterus; Endometrial ablation (2012); Abdominal hysterectomy (Bilateral, 02/12/2016); Salpingoophorectomy (Bilateral, 02/12/2016); Breast excisional biopsy; Cataract extraction, bilateral (06/2019); Extracorporeal shock wave lithotripsy (Left, 05/24/2022); Joint replacement; and Eye surgery. Family History:  Her family history includes ADD / ADHD in her son; Alcohol abuse in her mother; Alcoholism in her mother; Anxiety disorder in her mother and son; Arthritis in her father and mother; Asthma in her father and paternal grandfather; COPD in her father and paternal grandfather; Cancer in her brother, maternal uncle, mother, paternal grandmother, and paternal uncle; Colon cancer in her maternal uncle; Colon polyps in her father; Depression in her mother; Diabetes in her father; Heart disease in her father, mother, and paternal grandfather; Hyperlipidemia in her father and mother; Hypertension in her daughter, father, mother, and son; Kidney disease in her father; Obesity in her father; Pancreatic cancer in her mother and paternal uncle; Prostate cancer in her maternal grandfather; Sleep apnea in her father; Stroke in her mother; Testicular cancer in her brother.  REVIEW OF SYSTEMS  : All other systems reviewed and negative except where noted in the History  of Present Illness.  PHYSICAL EXAM: BP 118/78   Pulse 65   Ht 5\' 6"  (1.676 m)   Wt 197 lb 8 oz (89.6 kg)   LMP 04/09/2013   BMI 31.88 kg/m  Physical Exam          Edmonia Gottron, PA-C 2:56 PM

## 2023-09-06 NOTE — Patient Instructions (Addendum)
 _______________________________________________________  If your blood pressure at your visit was 140/90 or greater, please contact your primary care physician to follow up on this.  _______________________________________________________  If you are age 61 or older, your body mass index should be between 23-30. Your Body mass index is 31.88 kg/m. If this is out of the aforementioned range listed, please consider follow up with your Primary Care Provider.  If you are age 68 or younger, your body mass index should be between 19-25. Your Body mass index is 31.88 kg/m. If this is out of the aformentioned range listed, please consider follow up with your Primary Care Provider.   ________________________________________________________  The [facility] providers would like to encourage you to use MYCHART to communicate with providers for non-urgent requests or questions.  Due to long hold times on the telephone, sending your provider a message by [facility] may be a faster and more efficient way to get a response.  Please allow 48 business hours for a response.  Please remember that this is for non-urgent requests.  _______________________________________________________   Your provider has requested that you have an abdominal x ray before leaving today. Please go to the basement floor to our Radiology department for the test.  You have been scheduled for a colonoscopy. Please follow written instructions given to you at your visit today.   If you use inhalers (even only as needed), please bring them with you on the day of your procedure.  DO NOT TAKE 7 DAYS PRIOR TO TEST- Trulicity (dulaglutide) Ozempic, Wegovy (semaglutide) Mounjaro (tirzepatide) Bydureon Bcise (exanatide extended release)  DO NOT TAKE 1 DAY PRIOR TO YOUR TEST Rybelsus (semaglutide) Adlyxin (lixisenatide) Victoza (liraglutide) Byetta  (exanatide) ___________________________________________________________________________   FIBER SUPPLEMENT You can do metamucil or fibercon once or twice a day but if this causes gas/bloating please switch to Benefiber or Citracel.  Fiber is good for constipation/diarrhea/irritable bowel syndrome.  It can also help with weight loss and can help lower your bad cholesterol (LDL).  Please do 1 TBSP in the morning in water, coffee, or tea.  It can take up to a month before you can see a difference with your bowel movements.  It is cheapest from [employer], [employer], [employer].   Toileting tips to help with your constipation - Drink at least 64-80 ounces of water/liquid per day. - Establish a time to try to move your bowels every day.  For many people, this is after a cup of coffee or after a meal such as breakfast. - Sit all of the way back on the toilet keeping your back fairly straight and while sitting up, try to rest the tops of your forearms on your upper thighs.   - Raising your feet with a step stool/squatty potty can be helpful to improve the angle that allows your stool to pass through the rectum. - Relax the rectum feeling it bulge toward the toilet water.  If you feel your rectum raising toward your body, you are contracting rather than relaxing. - Breathe in and slowly exhale. "Belly breath" by expanding your belly towards your belly button. Keep belly expanded as you gently direct pressure down and back to the anus.  A low pitched GRRR sound can assist with increasing intra-abdominal pressure.  (Can also trying to blow on a pinwheel and make it move, this helps with the same belly breathing) - Repeat 3-4 times. If unsuccessful, contract the pelvic floor to restore normal tone and get off the toilet.  Avoid excessive straining. -  To reduce excessive wiping by teaching your anus to normally contract, place hands on outer aspect of knees and resist knee movement outward.  Hold 5-10 second then  place hands just inside of knees and resist inward movement of knees.  Hold 5 seconds.  Repeat a few times each way.  Go to the ER if unable to pass gas, severe AB pain, unable to hold down food, any shortness of breath of chest pain.  Small intestinal bacterial overgrowth (SIBO) occurs when there is an abnormal increase in the overall bacterial population in the small intestine -- particularly types of bacteria not commonly found in that part of the digestive tract. Small intestinal bacterial overgrowth (SIBO) commonly results when a circumstance -- such as surgery or disease -- slows the passage of food and waste products in the digestive tract, creating a breeding ground for bacteria.  Signs and symptoms of SIBO often include: Loss of appetite Abdominal pain Nausea Bloating An uncomfortable feeling of fullness after eating Diarrhea or constipation, depending on the type of gas produced  What foods trigger SIBO? While foods aren't the original cause of SIBO, certain foods do encourage the overgrowth of the wrong bacteria in your small intestine. If you're feeding them their favorite foods, they're going to grow more, and that will trigger more of your SIBO symptoms. By the same token, you can help reduce the overgrowth by starving the problematic bacteria of their favorite foods. This strategy has led to a number of proposed SIBO eating plans. The plans vary, and so do individual results. But in general, they tend to recommend limiting carbohydrates.  These include: Sugars and sweeteners. Fruits and starchy vegetables. Dairy products. Grains.  There is a test for this we can do called a breath test, if you are positive we will treat you with an antibiotic to see if it helps.  Your symptoms are very suspicious for this condition, as discussed, we will start you on an antibiotic to see if this helps.   Here some information about pelvic floor dysfunction. This may be contributing to some  of your symptoms. We will continue with our evaluation but I do want you to consider adding on fiber supplement with low-dose MiraLAX daily. We could also refer to pelvic floor physical therapy.   Pelvic Floor Dysfunction, Female Pelvic floor dysfunction (PFD) is a condition that results when the group of muscles and connective tissues that support the organs in the pelvis (pelvic floor muscles) do not work well. These muscles and their connections form a sling that supports the colon and bladder. In women, they also support the uterus. PFD causes pelvic floor muscles to be too weak, too tight, or both. In PFD, muscle movements are not coordinated. This may cause bowel or bladder problems. It may also cause pain. What are the causes? This condition may be caused by an injury to the pelvic area or by a weakening of pelvic muscles. This often results from pregnancy and childbirth or other types of strain. In many cases, the exact cause is not known. What increases the risk? The following factors may make you more likely to develop this condition: Having chronic bladder tissue inflammation (interstitial cystitis). Being an older person. Being overweight. History of radiation treatment for cancer in the pelvic region. Previous pelvic surgery, such as removal of the uterus (hysterectomy). What are the signs or symptoms? Symptoms of this condition vary and may include: Bladder symptoms, such as: Trouble starting urination and emptying the bladder.  Frequent urinary tract infections. Leaking urine when coughing, laughing, or exercising (stress incontinence). Having to pass urine urgently or frequently. Pain when passing urine. Bowel symptoms, such as: Constipation. Urgent or frequent bowel movements. Incomplete bowel movements. Painful bowel movements. Leaking stool or gas. Unexplained genital or rectal pain. Genital or rectal muscle spasms. Low back pain. Other symptoms may include: A  heavy, full, or aching feeling in the vagina. A bulge that protrudes into the vagina. Pain during or after sex. How is this diagnosed? This condition may be diagnosed based on: Your symptoms and medical history. A physical exam. During the exam, your health care provider may check your pelvic muscles for tightness, spasm, pain, or weakness. This may include a rectal exam and a pelvic exam. In some cases, you may have diagnostic tests, such as: Electrical muscle function tests. Urine flow testing. X-ray tests of bowel function. Ultrasound of the pelvic organs. How is this treated? Treatment for this condition depends on the symptoms. Treatment options include: Physical therapy. This may include Kegel exercises to help relax or strengthen the pelvic floor muscles. Biofeedback. This type of therapy provides feedback on how tight your pelvic floor muscles are so that you can learn to control them. Internal or external massage therapy. A treatment that involves electrical stimulation of the pelvic floor muscles to help control pain (transcutaneous electrical nerve stimulation, or TENS). Sound wave therapy (ultrasound) to reduce muscle spasms. Medicines, such as: Muscle relaxants. Bladder control medicines. Surgery to reconstruct or support pelvic floor muscles may be an option if other treatments do not help. Follow these instructions at home: Activity Do your usual activities as told by your health care provider. Ask your health care provider if you should modify any activities. Do pelvic floor strengthening or relaxing exercises at home as told by your physical therapist. Lifestyle Maintain a healthy weight. Eat foods that are high in fiber, such as beans, whole grains, and fresh fruits and vegetables. Limit foods that are high in fat and processed sugars, such as fried or sweet foods. Manage stress with relaxation techniques such as yoga or meditation. General instructions If you have  problems with leakage: Use absorbable pads or wear padded underwear. Wash frequently with mild soap. Keep your genital and anal area as clean and dry as possible. Ask your health care provider if you should try a barrier cream to prevent skin irritation. Take warm baths to relieve pelvic muscle tension or spasms. Take over-the-counter and prescription medicines only as told by your health care provider. Keep all follow-up visits. How is this prevented? The cause of PFD is not always known, but there are a few things you can do to reduce the risk of developing this condition, including: Staying at a healthy weight. Getting regular exercise. Managing stress. Contact a health care provider if: Your symptoms are not improving with home care. You have signs or symptoms of PFD that get worse at home. You develop new signs or symptoms. You have signs of a urinary tract infection, such as: Fever. Chills. Increased urinary frequency. A burning feeling when urinating. You have not had a bowel movement in 3 days (constipation). Summary Pelvic floor dysfunction results when the muscles and connective tissues in your pelvic floor do not work well. These muscles and their connections form a sling that supports your colon and bladder. In women, they also support the uterus. PFD may be caused by an injury to the pelvic area or by a weakening of pelvic muscles. PFD  causes pelvic floor muscles to be too weak, too tight, or a combination of both. Symptoms may vary from person to person. In most cases, PFD can be treated with physical therapies and medicines. Surgery may be an option if other treatments do not help. This information is not intended to replace advice given to you by your health care provider. Make sure you discuss any questions you have with your health care provider. Document Revised: 09/03/2020 Document Reviewed: 09/03/2020 [employer] Patient Education  2022 [employer].

## 2023-09-07 NOTE — Telephone Encounter (Signed)
 Patient understands.

## 2023-09-15 ENCOUNTER — Ambulatory Visit
Admission: RE | Admit: 2023-09-15 | Discharge: 2023-09-15 | Disposition: A | Discharge status: Home / Self Care | Source: Ambulatory Visit | Attending: Obstetrics and Gynecology | Admitting: Obstetrics and Gynecology

## 2023-09-15 DIAGNOSIS — R928 Other abnormal and inconclusive findings on diagnostic imaging of breast: Secondary | ICD-10-CM

## 2023-09-21 ENCOUNTER — Encounter: Payer: Self-pay | Admitting: Pediatrics

## 2023-09-29 NOTE — Progress Notes (Signed)
 [facility] History and Physical   Primary Care Physician:  Cleave Curling, MD   Reason for Procedure:  History of adenomatous colon polyps  Plan:    Surveillance colonoscopy     HPI: Grace [name] is a 63 y.o. female undergoing surveillance colonoscopy for history of adenomatous colon polyps.  Patient has had colonoscopies performed in 1997, 2001, 2014 and 2020.  Colonoscopy in 2014 disclosed a 6 mm tubular adenoma.  Colonoscopy in 2020 was normal.  There is a reported family history of colorectal cancer in a maternal uncle.  Patient denies current symptoms of rectal bleeding. Endorses worsening symptoms of diarrhea. Father with polyps.   Past Medical History:  Diagnosis Date   Allergy    Anxiety    Avascular necrosis (HCC)    Back pain    Cancer (HCC)    Cataract    Had surgery   Chest pain    Chewing difficulty    Chronic fatigue syndrome    Colon polyp    pre-cancerous   Complication of anesthesia    patient states she requires more anesthesia   Depression    Disorder of soft tissue    Diverticulosis    Dizziness    Edema, lower extremity    Encounter for annual health examination 05/02/2023   Essential hypertension, benign 05/12/2023   Fatty liver    Fibromyalgia    GERD (gastroesophageal reflux disease)    Glaucoma 07-2019   From surgery   H/O breast biopsy    pre- cancerous cells   Hand fracture, left    9/87   Heart palpitations    Heart valve disease    History of Holter monitoring    Hyperlipidemia    Insomnia    Joint pain    Lactose intolerance    Lhermitte's syndrome    Migraines    Multiple food allergies    Multiple sclerosis (HCC)    dx in 11/1996   Multiple sclerosis (HCC)    Neuromuscular disorder (HCC)    Neuropathy    Numbness and tingling in hands    Obesity    Optic neuritis    OSA (obstructive sleep apnea)    no longer have OSA since stopped taking a MS medication   Osteoarthritis    Otitis media    Palpitations     Prediabetes    Raynaud's phenomenon    Shortness of breath    Sleep apnea    Due to medication that she no longer takes.   Swallowing difficulty    Vaginitis and vulvovaginitis    Vitamin B12 deficiency    Vitamin D  deficiency     Past Surgical History:  Procedure Laterality Date   ABDOMINAL HYSTERECTOMY Bilateral 02/12/2016   Procedure: HYSTERECTOMY ABDOMINAL WITH BILATERAL SALPINGO OOPHERECTOMY;  Surgeon: Thurman Flores, MD;  Location: [facility];  Service: Gynecology;  Laterality: Bilateral;   BREAST EXCISIONAL BIOPSY     core/left breast   CATARACT EXTRACTION, BILATERAL  06/2019   CHOLECYSTECTOMY  1983   COLONOSCOPY     1997/2001   DILATION AND CURETTAGE OF UTERUS     ENDOMETRIAL ABLATION  2012   EXTRACORPOREAL SHOCK WAVE LITHOTRIPSY Left 05/24/2022   Procedure: LEFT EXTRACORPOREAL SHOCK WAVE LITHOTRIPSY (ESWL);  Surgeon: Andrez Banker, MD;  Location: [facility];  Service: Urology;  Laterality: Left;   EYE SURGERY     JOINT REPLACEMENT     LAPAROSCOPIC ABDOMINAL EXPLORATION     ovaries and intestines bond  together   LEFT HEART CATHETERIZATION WITH CORONARY ANGIOGRAM N/A 06/21/2011   Procedure: LEFT HEART CATHETERIZATION WITH CORONARY ANGIOGRAM;  Surgeon: Arleen Lacer, MD;  Location: [facility] CATH LAB;  Service: Cardiovascular;  Laterality: N/A;   LUMBAR PUNCTURE     11/01/1996   SALPINGOOPHORECTOMY Bilateral 02/12/2016   Procedure: BILATERAL SALPINGO OOPHORECTOMY;  Surgeon: Thurman Flores, MD;  Location: [facility];  Service: Gynecology;  Laterality: Bilateral;   TOTAL HIP ARTHROPLASTY     left hip  6/02  /right hip12/04    Prior to Admission medications   Medication Sig Start Date End Date Taking? Authorizing Provider  Albuterol-Budesonide (AIRSUPRA ) 90-80 MCG/ACT AERO Inhale 2 puffs into the lungs every 6 (six) hours as needed. 03/09/23   Melodie Spry, NP  ALPRAZolam  (XANAX ) 0.5 MG tablet One po every day prn 03/30/23   Sater, Sherida Dimmer, MD   amLODipine  (NORVASC ) 2.5 MG tablet Take 1 tablet (2.5 mg total) by mouth daily. 05/02/23 05/01/24  Cleave Curling, MD  amphetamine -dextroamphetamine (ADDERALL XR) 30 MG 24 hr capsule Take 1 capsule (30 mg total) by mouth daily. 03/30/23   Sater, Sherida Dimmer, MD  aspirin  EC 81 MG tablet Take 81 mg by mouth daily.    [provider]  Azelastine  HCl 137 MCG/SPRAY SOLN PLACE 1 SPRAY INTO BOTH NOSTRILS 2 (TWO) TIMES DAILY. USE IN EACH NOSTRIL AS DIRECTED 03/31/23   Melodie Spry, NP  baclofen  (LIORESAL ) 10 MG tablet TAKE 2 TABLETS BY MOUTH 3 TIMES A DAY 07/25/23   Sater, Sherida Dimmer, MD  bisacodyl  5 MG EC tablet Take 4 tablets (20 mg total) by mouth as directed. 09/06/23   Edmonia Gottron, PA-C  Cholecalciferol (VITAMIN D ) 50 MCG (2000 UT) CAPS Take 2,000 Units by mouth daily.    [provider]  colestipol  (COLESTID ) 1 g tablet Take 1 tablet (1 g total) by mouth 2 (two) times daily. 08/26/22   Asencion Blacksmith, MD  dicyclomine  (BENTYL ) 10 MG capsule Take 1 capsule (10 mg total) by mouth 3 (three) times daily before meals. 08/26/22   Asencion Blacksmith, MD  diphenhydrAMINE  (BENADRYL ) 25 MG tablet Take 25 mg by mouth 3 (three) times daily. Takes with Hydrocodone     [provider]  doxepin  (SINEQUAN ) 10 MG capsule Take 1-2 capsules (10-20 mg total) by mouth at bedtime. 08/09/23   Lomax, Amy, NP  famotidine  (PEPCID ) 20 MG tablet Take 1 tablet (20 mg total) by mouth 2 (two) times daily. 05/02/23   Cleave Curling, MD  gabapentin  (NEURONTIN ) 300 MG capsule Take 1 capsule (300 mg total) by mouth 2 (two) times daily. 07/13/22   Sater, Sherida Dimmer, MD  HYDROcodone -acetaminophen  (NORCO/VICODIN) 5-325 MG tablet Take 1 tablet by mouth 3 (three) times daily as needed for moderate pain (pain score 4-6). 07/05/23   Sater, Sherida Dimmer, MD  levocetirizine (XYZAL ) 5 MG tablet TAKE 1 TABLET BY MOUTH EVERY DAY IN THE EVENING 06/10/23   Cleave Curling, MD  Melatonin 10 MG TABS Take 1 tablet by mouth at bedtime.      [provider]  montelukast  (SINGULAIR ) 10 MG tablet TAKE 1 TABLET BY MOUTH EVERY DAY 06/10/23   Melodie Spry, NP  ondansetron  (ZOFRAN -ODT) 4 MG disintegrating tablet Take 1 tablet (4 mg total) by mouth every 8 (eight) hours as needed for nausea or vomiting. 05/20/22   Editha Goring A, PA-C  polyethylene glycol powder (GLYCOLAX /MIRALAX ) 17 GM/SCOOP powder Take 238 g by mouth as directed. 09/06/23   Edmonia Gottron, PA-C  rizatriptan  (MAXALT -MLT) 10 MG disintegrating tablet Take 1 tablet (10 mg total) by mouth as needed for migraine. May repeat in 2 hours if needed.  Max of 2 tablets in 24 hours, and 10 tablets per month 11/23/22   Lomax, Amy, NP  tiZANidine  (ZANAFLEX ) 2 MG tablet TAKE UP TO 4 TABLETS BY MOUTH EVERY DAY 08/09/23   Lomax, Amy, NP  Vitamin D , Ergocalciferol , (DRISDOL ) 1.25 MG (50000 UNIT) CAPS capsule ONE CAPSULE TWICE WEEKLY ON TUESDAYS/FRIDAYS 02/18/23   Cleave Curling, MD    Current Outpatient Medications  Medication Sig Dispense Refill   amLODipine  (NORVASC ) 2.5 MG tablet Take 1 tablet (2.5 mg total) by mouth daily. 30 tablet 11   aspirin  EC 81 MG tablet Take 81 mg by mouth daily.     baclofen  (LIORESAL ) 10 MG tablet TAKE 2 TABLETS BY MOUTH 3 TIMES A DAY 540 tablet 0   bisacodyl  5 MG EC tablet Take 4 tablets (20 mg total) by mouth as directed. 4 tablet 0   Cranberry-Vitamin C-Probiotic (AZO CRANBERRY PO)      diphenhydrAMINE  (BENADRYL ) 25 MG tablet Take 25 mg by mouth 3 (three) times daily. Takes with Hydrocodone      doxepin  (SINEQUAN ) 10 MG capsule Take 1-2 capsules (10-20 mg total) by mouth at bedtime. 180 capsule 0   famotidine  (PEPCID ) 20 MG tablet Take 1 tablet (20 mg total) by mouth 2 (two) times daily. 180 tablet 1   HYDROcodone -acetaminophen  (NORCO/VICODIN) 5-325 MG tablet Take 1 tablet by mouth 3 (three) times daily as needed for moderate pain (pain score 4-6). 270 tablet 0   levocetirizine (XYZAL ) 5 MG tablet TAKE 1 TABLET BY MOUTH EVERY DAY IN THE EVENING 90  tablet 2   polyethylene glycol powder (GLYCOLAX /MIRALAX ) 17 GM/SCOOP powder Take 238 g by mouth as directed. 238 g 0   tiZANidine  (ZANAFLEX ) 2 MG tablet TAKE UP TO 4 TABLETS BY MOUTH EVERY DAY 360 tablet 1   Albuterol-Budesonide (AIRSUPRA ) 90-80 MCG/ACT AERO Inhale 2 puffs into the lungs every 6 (six) hours as needed. 10.7 g 2   ALPRAZolam  (XANAX ) 0.5 MG tablet One po every day prn 15 tablet 0   amoxicillin  (AMOXIL ) 500 MG capsule TAKE 4 CAPSULES 1 HOUR PRIOR TO APPT     amphetamine -dextroamphetamine (ADDERALL XR) 30 MG 24 hr capsule Take 1 capsule (30 mg total) by mouth daily. 30 capsule 0   Azelastine  HCl 137 MCG/SPRAY SOLN PLACE 1 SPRAY INTO BOTH NOSTRILS 2 (TWO) TIMES DAILY. USE IN EACH NOSTRIL AS DIRECTED 90 mL 1   Cholecalciferol (VITAMIN D ) 50 MCG (2000 UT) CAPS Take 2,000 Units by mouth daily.     colestipol  (COLESTID ) 1 g tablet Take 1 tablet (1 g total) by mouth 2 (two) times daily. 60 tablet 11   dicyclomine  (BENTYL ) 10 MG capsule Take 1 capsule (10 mg total) by mouth 3 (three) times daily before meals. 90 capsule 11   gabapentin  (NEURONTIN ) 300 MG capsule Take 1 capsule (300 mg total) by mouth 2 (two) times daily. 180 capsule 3   Melatonin 10 MG TABS Take 1 tablet by mouth at bedtime.      montelukast  (SINGULAIR ) 10 MG tablet TAKE 1 TABLET BY MOUTH EVERY DAY 90 tablet 2   ondansetron  (ZOFRAN -ODT) 4 MG disintegrating tablet Take 1 tablet (4 mg total) by mouth every 8 (eight) hours as needed for nausea or vomiting. 12 tablet 0   rizatriptan  (MAXALT -MLT) 10 MG disintegrating tablet Take 1 tablet (10 mg total) by mouth as needed for  migraine. May repeat in 2 hours if needed.  Max of 2 tablets in 24 hours, and 10 tablets per month 10 tablet 11   Vitamin D , Ergocalciferol , (DRISDOL ) 1.25 MG (50000 UNIT) CAPS capsule ONE CAPSULE TWICE WEEKLY ON TUESDAYS/FRIDAYS 24 capsule 2   Current Facility-Administered Medications  Medication Dose Route Frequency Provider Last Rate Last Admin   0.9 %   sodium chloride  infusion  500 mL Intravenous Continuous Jasani Dolney, Scarlette Currier, MD        Allergies as of 09/30/2023 - Review Complete 09/30/2023  Allergen Reaction Noted   Diclofenac Anaphylaxis 09/15/2017   Lamictal [lamotrigine] Shortness Of Breath, Itching, and Swelling 06/15/2011   Mobic [meloxicam] Anaphylaxis 06/08/2016   Ultram  [tramadol  hcl] Anaphylaxis 06/15/2011   Nefazodone Itching and Swelling 09/25/2020   Banana  05/20/2022   Beef-derived drug products  05/20/2022   Betaseron [interferon beta-1b] Other (See Comments) 09/25/2020   Codeine Itching 06/15/2011   Copaxone [glatiramer acetate] Other (See Comments) 09/25/2020   Flomax  [tamsulosin  hcl] Itching 07/13/2022   Gabapentin  Itching 09/25/2020   Latex  04/17/2013   Nitrofurantoin   03/06/2019   Oxcarbazepine  Itching and Other (See Comments) 09/25/2020   Pork-derived products  05/20/2022   Teriflunomide Diarrhea 04/27/2018   Trazodone  and nefazodone  01/03/2019   Tape Rash 06/30/2016    Family History  Problem Relation Age of Onset   Pancreatic cancer Mother    Stroke Mother    Hypertension Mother    Hyperlipidemia Mother    Heart disease Mother    Cancer Mother    Depression Mother    Anxiety disorder Mother    Alcoholism Mother    Alcohol abuse Mother    Arthritis Mother    Colon polyps Father    Diabetes Father    Hypertension Father    Hyperlipidemia Father    Kidney disease Father    Sleep apnea Father    Obesity Father    Arthritis Father    Asthma Father    COPD Father    Heart disease Father    Testicular cancer Brother    Cancer Brother    Colon cancer Maternal Uncle    Stomach cancer Maternal Uncle    Cancer Maternal Uncle    Pancreatic cancer Paternal Uncle    Cancer Paternal Uncle    Prostate cancer Maternal Grandfather    Cancer Paternal Grandmother    Asthma Paternal Grandfather    COPD Paternal Grandfather    Heart disease Paternal Grandfather    Hypertension Daughter    ADD / ADHD  Son    Anxiety disorder Son    Hypertension Son    Multiple sclerosis Neg Hx    Esophageal cancer Neg Hx     Social History   Socioeconomic History   Marital status: Widowed    Spouse name: Not on file   Number of children: Not on file   Years of education: Not on file   Highest education level: 12th grade  Occupational History   Occupation: disabled  Tobacco Use   Smoking status: Former    Current packs/day: 0.00    Average packs/day: 0.3 packs/day for 3.0 years (0.8 ttl pk-yrs)    Types: Cigarettes    Start date: 06/30/1992    Quit date: 07/01/1995    Years since quitting: 28.2   Smokeless tobacco: Never  Vaping Use   Vaping status: Never Used  Substance and Sexual Activity   Alcohol use: No   Drug use: Yes  Types: Hydrocodone    Sexual activity: Not Currently    Birth control/protection: Abstinence, Post-menopausal, Surgical  Other Topics Concern   Not on file  Social History Narrative   Pt lives alone    Disability    Social Drivers of Health   Financial Resource Strain: Low Risk  (10/27/2022)   Overall Financial Resource Strain (CARDIA)    Difficulty of Paying Living Expenses: Not hard at all  Food Insecurity: Low Risk  (12/22/2022)   Received from [facility]   Hunger Vital Sign    Worried About Running Out of Food in the Last Year: Never true    Ran Out of Food in the Last Year: Never true  Recent Concern: Food Insecurity - Food Insecurity Present (10/18/2022)   Hunger Vital Sign    Worried About Running Out of Food in the Last Year: Sometimes true    Ran Out of Food in the Last Year: Sometimes true  Transportation Needs: Not on file (12/22/2022)  Physical Activity: Insufficiently Active (10/27/2022)   Exercise Vital Sign    Days of Exercise per Week: 2 days    Minutes of Exercise per Session: 60 min  Stress: No Stress Concern Present (10/27/2022)   [employer] of Occupational Health - Occupational Stress Questionnaire    Feeling of Stress : Only a  little  Social Connections: Moderately Integrated (10/27/2022)   Social Connection and Isolation Panel [NHANES]    Frequency of Communication with Friends and Family: Three times a week    Frequency of Social Gatherings with Friends and Family: Once a week    Attends Religious Services: 1 to 4 times per year    Active Member of [employer] or Organizations: Yes    Attends Banker Meetings: Not on file    Marital Status: Widowed  Recent Concern: Social Connections - Moderately Isolated (10/18/2022)   Social Connection and Isolation Panel [NHANES]    Frequency of Communication with Friends and Family: More than three times a week    Frequency of Social Gatherings with Friends and Family: Twice a week    Attends Religious Services: 1 to 4 times per year    Active Member of [employer] or Organizations: No    Attends Banker Meetings: Not on file    Marital Status: Widowed  Intimate Partner Violence: Not At Risk (10/28/2022)   Humiliation, Afraid, Rape, and Kick questionnaire    Fear of Current or Ex-Partner: No    Emotionally Abused: No    Physically Abused: No    Sexually Abused: No    Review of Systems:  All other review of systems negative except as mentioned in the HPI.  Physical Exam: Vital signs BP (!) 135/96   Pulse 68   Temp (!) 97.3 F (36.3 C) (Temporal)   Ht 5\' 6"  (1.676 m)   Wt 197 lb (89.4 kg)   LMP 04/09/2013   SpO2 95%   BMI 31.80 kg/m   General:   Alert,  Well-developed, well-nourished, pleasant and cooperative in NAD Airway:  Mallampati 3 Lungs:  Clear throughout to auscultation.   Heart:  Regular rate and rhythm; no murmurs, clicks, rubs,  or gallops. Abdomen:  Soft, nontender and nondistended. Normal bowel sounds.   Neuro/Psych:  Normal mood and affect. A and O x 3  Eugenia Hess, MD [facility]

## 2023-09-30 ENCOUNTER — Encounter: Payer: Self-pay | Admitting: Pediatrics

## 2023-09-30 ENCOUNTER — Ambulatory Visit (AMBULATORY_SURGERY_CENTER): Admitting: Pediatrics

## 2023-09-30 VITALS — BP 129/69 | HR 62 | Temp 97.3°F | Resp 12 | Ht 66.0 in | Wt 197.0 lb

## 2023-09-30 DIAGNOSIS — K648 Other hemorrhoids: Secondary | ICD-10-CM | POA: Diagnosis not present

## 2023-09-30 DIAGNOSIS — R197 Diarrhea, unspecified: Secondary | ICD-10-CM

## 2023-09-30 DIAGNOSIS — K5730 Diverticulosis of large intestine without perforation or abscess without bleeding: Secondary | ICD-10-CM | POA: Diagnosis not present

## 2023-09-30 DIAGNOSIS — K6289 Other specified diseases of anus and rectum: Secondary | ICD-10-CM

## 2023-09-30 DIAGNOSIS — Z1211 Encounter for screening for malignant neoplasm of colon: Secondary | ICD-10-CM | POA: Diagnosis not present

## 2023-09-30 MED ORDER — SODIUM CHLORIDE 0.9 % IV SOLN: 500.0000 mL | INTRAVENOUS | Status: DC

## 2023-09-30 MED ORDER — FLEET ENEMA RE ENEM
1.0000 | ENEMA | Freq: Once | RECTAL | Status: AC
  Administered 2023-09-30: 1 via RECTAL

## 2023-09-30 NOTE — Patient Instructions (Addendum)
-  Handout on diverticulosis and hemorrhoids provided. -await pathology results. -repeat colonoscopy for surveillance recommended in 5-10 years  YOU HAD AN ENDOSCOPIC PROCEDURE TODAY AT THE [facility]:   Refer to the procedure report that was given to you for any specific questions about what was found during the examination.  If the procedure report does not answer your questions, please call your gastroenterologist to clarify.  If you requested that your care partner not be given the details of your procedure findings, then the procedure report has been included in a sealed envelope for you to review at your convenience later.  YOU SHOULD EXPECT: Some feelings of bloating in the abdomen. Passage of more gas than usual.  Walking can help get rid of the air that was put into your GI tract during the procedure and reduce the bloating. If you had a lower endoscopy (such as a colonoscopy or flexible sigmoidoscopy) you may notice spotting of blood in your stool or on the toilet paper. If you underwent a bowel prep for your procedure, you may not have a normal bowel movement for a few days.  Please Note:  You might notice some irritation and congestion in your nose or some drainage.  This is from the oxygen used during your procedure.  There is no need for concern and it should clear up in a day or so.  SYMPTOMS TO REPORT IMMEDIATELY:  Following lower endoscopy (colonoscopy or flexible sigmoidoscopy):  Excessive amounts of blood in the stool  Significant tenderness or worsening of abdominal pains  Swelling of the abdomen that is new, acute  Fever of 100F or higher  For urgent or emergent issues, a gastroenterologist can be reached at any hour by calling [phone]. Do not use MyChart messaging for urgent concerns.    DIET:  We do recommend a small meal at first, but then you may proceed to your regular diet.  Drink plenty of fluids but you should avoid alcoholic beverages for 24  hours.  ACTIVITY:  You should plan to take it easy for the rest of today and you should NOT DRIVE or use heavy machinery until tomorrow (because of the sedation medicines used during the test).    FOLLOW UP: Our staff will call the number listed on your records the next business day following your procedure.  We will call around 7:15- 8:00 am to check on you and address any questions or concerns that you may have regarding the information given to you following your procedure. If we do not reach you, we will leave a message.     If any biopsies were taken you will be contacted by phone or by letter within the next 1-3 weeks.  Please call us  at [phone] if you have not heard about the biopsies in 3 weeks.    SIGNATURES/CONFIDENTIALITY: You and/or your care partner have signed paperwork which will be entered into your electronic medical record.  These signatures attest to the fact that that the information above on your After Visit Summary has been reviewed and is understood.  Full responsibility of the confidentiality of this discharge information lies with you and/or your care-partner.

## 2023-09-30 NOTE — Progress Notes (Signed)
 Called to room to assist during endoscopic procedure.  Patient ID and intended procedure confirmed with present staff. Received instructions for my participation in the procedure from the performing physician.

## 2023-09-30 NOTE — Op Note (Signed)
 [facility] Patient Name: Grace [name] Procedure Date: 09/30/2023 9:46 AM MRN: [id number] Endoscopist: Eugenia Hess , MD, [id number] Age: 63 Referring MD:  Date of Birth: 1961/03/22 Gender: Female Account #: [id number] Procedure:                Colonoscopy Indications:              Colon cancer screening in patient at increased                            risk: Family history of 1st-degree relative with                            colon polyps, Maternal uncle with history of                            colorectal cancer last colonoscopy: 2020,                            Incidental diarrhea noted Medicines:                Monitored Anesthesia Care Procedure:                Pre-Anesthesia Assessment:                           - Prior to the procedure, a History and Physical                            was performed, and patient medications and                            allergies were reviewed. The patient's tolerance of                            previous anesthesia was also reviewed. The risks                            and benefits of the procedure and the sedation                            options and risks were discussed with the patient.                            All questions were answered, and informed consent                            was obtained. Prior Anticoagulants: The patient has                            taken no anticoagulant or antiplatelet agents                            except for aspirin . ASA Grade Assessment: II - A  patient with mild systemic disease. After reviewing                            the risks and benefits, the patient was deemed in                            satisfactory condition to undergo the procedure.                           After obtaining informed consent, the colonoscope                            was passed under direct vision. Throughout the                            procedure, the patient's blood  pressure, pulse, and                            oxygen saturations were monitored continuously. The                            CF [id number] #[id number] was introduced through the anus                            and advanced to the terminal ileum. The colonoscopy                            was performed without difficulty. The patient                            tolerated the procedure well. The quality of the                            bowel preparation was adequate. The terminal ileum,                            ileocecal valve, appendiceal orifice, and rectum                            were photographed. Scope In: 10:01:37 AM Scope Out: 10:26:44 AM Scope Withdrawal Time: 0 hours 16 minutes 10 seconds  Total Procedure Duration: 0 hours 25 minutes 7 seconds  Findings:                 The perianal and digital rectal examinations were                            normal. Pertinent negatives include normal                            sphincter tone and no palpable rectal lesions.                           A moderate amount of semi-liquid stool was found in  the descending colon, in the transverse colon, in                            the ascending colon and in the cecum. Lavage of the                            area was performed using a large amount of sterile                            water, resulting in clearance with adequate                            visualization.                           Multiple medium-mouthed and small-mouthed                            diverticula were found in the sigmoid colon,                            descending colon and transverse colon.                           Normal mucosa was found in the entire colon.                            Biopsies for histology were taken with a cold                            forceps for evaluation of microscopic colitis.                           The terminal ileum appeared normal.                            Internal hemorrhoids were found during retroflexion.                           Anal papilla(e) were hypertrophied. Complications:            No immediate complications. Estimated blood loss:                            Minimal. Estimated Blood Loss:     Estimated blood loss was minimal. Impression:               - Stool in the descending colon, in the transverse                            colon, in the ascending colon and in the cecum.                           - Diverticulosis in the sigmoid colon, in the  descending colon and in the transverse colon.                           - Normal mucosa in the entire examined colon.                            Biopsied.                           - The examined portion of the ileum was normal.                           - Internal hemorrhoids.                           - Anal papilla(e) were hypertrophied. Recommendation:           - Discharge patient to home (ambulatory).                           - Await pathology results.                           - Repeat colonoscopy in 5-10 years for                            surveillance. Patient has been undergoing                            colonoscopies every 5 years presumably related to                            family history of polyps. If patient's father had                            an advanced adenoma then it is appropriate to                            continue on an every 5-year schedule. If patient's                            father did not have an advanced adenoma can                            consider extending to 10-year surveillance interval.                           - The findings and recommendations were discussed                            with the patient's family.                           - Patient has a contact number available for  emergencies. The signs and symptoms of potential                            delayed complications  were discussed with the                            patient. Return to normal activities tomorrow.                            Written discharge instructions were provided to the                            patient. Eugenia Hess, MD 09/30/2023 10:34:26 AM This report has been signed electronically.

## 2023-09-30 NOTE — Progress Notes (Signed)
 To pacu, VSS. Report to RN.tb

## 2023-09-30 NOTE — Progress Notes (Signed)
 Per patient her last bowel movement was brown and cloudy, Dr. Yvone Herd made aware. Verbal order obtained for fleet enema. Per patient, stool is now clear yellow. Dr. Yvone Herd made aware of improvement, ok to proceed with colonoscopy.

## 2023-10-04 ENCOUNTER — Telehealth: Payer: Self-pay

## 2023-10-04 NOTE — Telephone Encounter (Signed)
  Follow up Call-     09/30/2023    9:19 AM  Call back number  Post procedure Call Back phone  # [phone]  Permission to leave phone message Yes     Patient questions:  Do you have a fever, pain , or abdominal swelling? No. Pain Score  0 *  Have you tolerated food without any problems? Yes.    Have you been able to return to your normal activities? Yes.    Do you have any questions about your discharge instructions: Diet   No. Medications  No. Follow up visit  No.  Do you have questions or concerns about your Care? No.  Actions: * If pain score is 4 or above: No action needed, pain <4.

## 2023-10-05 ENCOUNTER — Ambulatory Visit: Payer: Self-pay | Admitting: Pediatrics

## 2023-10-05 LAB — SURGICAL PATHOLOGY

## 2023-10-06 ENCOUNTER — Other Ambulatory Visit: Payer: Self-pay

## 2023-10-06 DIAGNOSIS — K580 Irritable bowel syndrome with diarrhea: Secondary | ICD-10-CM

## 2023-10-06 MED ORDER — RIFAXIMIN 550 MG PO TABS: 550.0000 mg | ORAL_TABLET | Freq: Three times a day (TID) | ORAL | 0 refills | Status: AC

## 2023-10-17 ENCOUNTER — Ambulatory Visit: Payer: Medicare Other | Admitting: Family Medicine

## 2023-10-19 ENCOUNTER — Ambulatory Visit

## 2023-10-19 DIAGNOSIS — Z0000 Encounter for general adult medical examination without abnormal findings: Secondary | ICD-10-CM | POA: Diagnosis not present

## 2023-10-19 NOTE — Progress Notes (Signed)
 Subjective:   Grace [name] is a 63 y.o. who presents for a Medicare Wellness preventive visit.  As a reminder, Annual Wellness Visits don't include a physical exam, and some assessments may be limited, especially if this visit is performed virtually. We may recommend an in-person follow-up visit with your provider if needed.  Visit Complete: Virtual I connected with  Grace [name] on 10/19/23 by a video and audio enabled telemedicine application and verified that I am speaking with the correct person using two identifiers.  Patient Location: Home  Provider Location: Office/Clinic  I discussed the limitations of evaluation and management by telemedicine. The patient expressed understanding and agreed to proceed.  Vital Signs: Because this visit was a virtual/telehealth visit, some criteria may be missing or patient reported. Any vitals not documented were not able to be obtained and vitals that have been documented are patient reported.    Persons Participating in Visit: Patient.  AWV Questionnaire: Yes: Patient Medicare AWV questionnaire was completed by the patient on 10/19/2023; I have confirmed that all information answered by patient is correct and no changes since this date.  Cardiac Risk Factors include: advanced age (>74men, >68 women);dyslipidemia     Objective:     Today's Vitals   There is no height or weight on file to calculate BMI.     10/19/2023    2:59 PM 01/10/2023   10:38 AM 10/28/2022    2:04 PM 05/24/2022    9:08 AM 10/21/2021    2:45 PM 10/16/2020    3:04 PM 10/10/2019    2:49 PM  Advanced Directives  Does Patient Have a Medical Advance Directive? No No No No No No No  Would patient like information on creating a medical advance directive? No - Patient declined   No - Patient declined No - Patient declined No - Patient declined Yes (MAU/Ambulatory/Procedural Areas - Information given)    Current Medications (verified) Outpatient Encounter Medications as  of 10/19/2023  Medication Sig   Albuterol-Budesonide (AIRSUPRA ) 90-80 MCG/ACT AERO Inhale 2 puffs into the lungs every 6 (six) hours as needed.   ALPRAZolam  (XANAX ) 0.5 MG tablet One po every day prn   amLODipine  (NORVASC ) 2.5 MG tablet Take 1 tablet (2.5 mg total) by mouth daily.   amoxicillin  (AMOXIL ) 500 MG capsule TAKE 4 CAPSULES 1 HOUR PRIOR TO APPT   amphetamine -dextroamphetamine (ADDERALL XR) 30 MG 24 hr capsule Take 1 capsule (30 mg total) by mouth daily.   aspirin  EC 81 MG tablet Take 81 mg by mouth daily.   Azelastine  HCl 137 MCG/SPRAY SOLN PLACE 1 SPRAY INTO BOTH NOSTRILS 2 (TWO) TIMES DAILY. USE IN EACH NOSTRIL AS DIRECTED   baclofen  (LIORESAL ) 10 MG tablet TAKE 2 TABLETS BY MOUTH 3 TIMES A DAY   Cholecalciferol (VITAMIN D ) 50 MCG (2000 UT) CAPS Take 2,000 Units by mouth daily.   Cranberry-Vitamin C-Probiotic (AZO CRANBERRY PO)    diphenhydrAMINE  (BENADRYL ) 25 MG tablet Take 25 mg by mouth 3 (three) times daily. Takes with Hydrocodone    doxepin  (SINEQUAN ) 10 MG capsule Take 1-2 capsules (10-20 mg total) by mouth at bedtime.   famotidine  (PEPCID ) 20 MG tablet Take 1 tablet (20 mg total) by mouth 2 (two) times daily.   gabapentin  (NEURONTIN ) 300 MG capsule Take 1 capsule (300 mg total) by mouth 2 (two) times daily.   HYDROcodone -acetaminophen  (NORCO/VICODIN) 5-325 MG tablet Take 1 tablet by mouth 3 (three) times daily as needed for moderate pain (pain score 4-6).   levocetirizine (  XYZAL ) 5 MG tablet TAKE 1 TABLET BY MOUTH EVERY DAY IN THE EVENING   Melatonin 10 MG TABS Take 1 tablet by mouth at bedtime.    montelukast  (SINGULAIR ) 10 MG tablet TAKE 1 TABLET BY MOUTH EVERY DAY   ondansetron  (ZOFRAN -ODT) 4 MG disintegrating tablet Take 1 tablet (4 mg total) by mouth every 8 (eight) hours as needed for nausea or vomiting.   rifaximin  (XIFAXAN ) 550 MG TABS tablet Take 1 tablet (550 mg total) by mouth 3 (three) times daily for 14 days.   rizatriptan  (MAXALT -MLT) 10 MG disintegrating tablet  Take 1 tablet (10 mg total) by mouth as needed for migraine. May repeat in 2 hours if needed.  Max of 2 tablets in 24 hours, and 10 tablets per month   tiZANidine  (ZANAFLEX ) 2 MG tablet TAKE UP TO 4 TABLETS BY MOUTH EVERY DAY   Vitamin D , Ergocalciferol , (DRISDOL ) 1.25 MG (50000 UNIT) CAPS capsule ONE CAPSULE TWICE WEEKLY ON TUESDAYS/FRIDAYS   bisacodyl  5 MG EC tablet Take 4 tablets (20 mg total) by mouth as directed.   colestipol  (COLESTID ) 1 g tablet Take 1 tablet (1 g total) by mouth 2 (two) times daily. (Patient not taking: Reported on 10/19/2023)   dicyclomine  (BENTYL ) 10 MG capsule Take 1 capsule (10 mg total) by mouth 3 (three) times daily before meals. (Patient not taking: Reported on 10/19/2023)   polyethylene glycol powder (GLYCOLAX /MIRALAX ) 17 GM/SCOOP powder Take 238 g by mouth as directed.   No facility-administered encounter medications on file as of 10/19/2023.    Allergies (verified) Diclofenac, Lamictal [lamotrigine], Mobic [meloxicam], Ultram  [tramadol  hcl], Nefazodone, Banana, Beef-derived drug products, Betaseron [interferon beta-1b], Codeine, Copaxone [glatiramer acetate], Flomax  [tamsulosin  hcl], Gabapentin , Latex, Nitrofurantoin , Oxcarbazepine , Pork-derived products, Teriflunomide, Trazodone  and nefazodone, and Tape   History: Past Medical History:  Diagnosis Date   Allergy    Anxiety    Avascular necrosis (HCC)    Back pain    Cancer (HCC)    Cataract    Had surgery   Chest pain    Chewing difficulty    Chronic fatigue syndrome    Colon polyp    pre-cancerous   Complication of anesthesia    patient states she requires more anesthesia   Depression    Disorder of soft tissue    Diverticulosis    Dizziness    Edema, lower extremity    Encounter for annual health examination 05/02/2023   Essential hypertension, benign 05/12/2023   Fatty liver    Fibromyalgia    GERD (gastroesophageal reflux disease)    Glaucoma 07-2019   From surgery   H/O breast biopsy     pre- cancerous cells   Hand fracture, left    9/87   Heart palpitations    Heart valve disease    History of Holter monitoring    Hyperlipidemia    Insomnia    Joint pain    Lactose intolerance    Lhermitte's syndrome    Migraines    Multiple food allergies    Multiple sclerosis (HCC)    dx in 11/1996   Multiple sclerosis (HCC)    Neuromuscular disorder (HCC)    Neuropathy    Numbness and tingling in hands    Obesity    Optic neuritis    OSA (obstructive sleep apnea)    no longer have OSA since stopped taking a MS medication   Osteoarthritis    Otitis media    Palpitations    Prediabetes    Raynaud's phenomenon  Shortness of breath    Sleep apnea    Due to medication that she no longer takes.   Swallowing difficulty    Vaginitis and vulvovaginitis    Vitamin B12 deficiency    Vitamin D  deficiency    Past Surgical History:  Procedure Laterality Date   ABDOMINAL HYSTERECTOMY Bilateral 02/12/2016   Procedure: HYSTERECTOMY ABDOMINAL WITH BILATERAL SALPINGO OOPHERECTOMY;  Surgeon: Thurman Flores, MD;  Location: [facility];  Service: Gynecology;  Laterality: Bilateral;   BREAST EXCISIONAL BIOPSY     core/left breast   CATARACT EXTRACTION, BILATERAL  06/2019   CHOLECYSTECTOMY  1983   COLONOSCOPY     1997/2001   DILATION AND CURETTAGE OF UTERUS     ENDOMETRIAL ABLATION  2012   EXTRACORPOREAL SHOCK WAVE LITHOTRIPSY Left 05/24/2022   Procedure: LEFT EXTRACORPOREAL SHOCK WAVE LITHOTRIPSY (ESWL);  Surgeon: Andrez Banker, MD;  Location: [facility];  Service: Urology;  Laterality: Left;   EYE SURGERY     JOINT REPLACEMENT     LAPAROSCOPIC ABDOMINAL EXPLORATION     ovaries and intestines bond together   LEFT HEART CATHETERIZATION WITH CORONARY ANGIOGRAM N/A 06/21/2011   Procedure: LEFT HEART CATHETERIZATION WITH CORONARY ANGIOGRAM;  Surgeon: Arleen Lacer, MD;  Location: [facility] C[facility];  Service: Cardiovascular;  Laterality: N/A;   LUMBAR PUNCTURE      11/01/1996   SALPINGOOPHORECTOMY Bilateral 02/12/2016   Procedure: BILATERAL SALPINGO OOPHORECTOMY;  Surgeon: Thurman Flores, MD;  Location: [facility];  Service: Gynecology;  Laterality: Bilateral;   TOTAL HIP ARTHROPLASTY     left hip  6/02  /right hip12/04   Family History  Problem Relation Age of Onset   Pancreatic cancer Mother    Stroke Mother    Hypertension Mother    Hyperlipidemia Mother    Heart disease Mother    Cancer Mother    Depression Mother    Anxiety disorder Mother    Alcoholism Mother    Alcohol abuse Mother    Arthritis Mother    Colon polyps Father    Diabetes Father    Hypertension Father    Hyperlipidemia Father    Kidney disease Father    Sleep apnea Father    Obesity Father    Arthritis Father    Asthma Father    COPD Father    Heart disease Father    Testicular cancer Brother    Cancer Brother    Early death Brother    Colon cancer Maternal Uncle    Stomach cancer Maternal Uncle    Cancer Maternal Uncle    Pancreatic cancer Paternal Uncle    Cancer Paternal Uncle    Prostate cancer Maternal Grandfather    Cancer Paternal Grandmother    Asthma Paternal Grandfather    COPD Paternal Grandfather    Heart disease Paternal Grandfather    Hypertension Daughter    Anxiety disorder Daughter    ADD / ADHD Son    Anxiety disorder Son    Hypertension Son    Multiple sclerosis Neg Hx    Esophageal cancer Neg Hx    Social History   Socioeconomic History   Marital status: Widowed    Spouse name: Not on file   Number of children: Not on file   Years of education: Not on file   Highest education level: Associate degree: occupational, Scientist, product/process development, or vocational program  Occupational History   Occupation: disabled  Tobacco Use   Smoking status: Former    Current packs/day: 0.00  Average packs/day: 0.3 packs/day for 3.3 years (0.8 ttl pk-yrs)    Types: Cigarettes    Start date: 06/30/1992    Quit date: 07/01/1995    Years since quitting: 28.3    Smokeless tobacco: Never  Vaping Use   Vaping status: Never Used  Substance and Sexual Activity   Alcohol use: No   Drug use: Yes    Types: Hydrocodone    Sexual activity: Not Currently    Birth control/protection: Abstinence, Post-menopausal, Surgical  Other Topics Concern   Not on file  Social History Narrative   Pt lives alone    Disability    Social Drivers of Health   Financial Resource Strain: Low Risk  (10/19/2023)   Overall Financial Resource Strain (CARDIA)    Difficulty of Paying Living Expenses: Not hard at all  Food Insecurity: No Food Insecurity (10/19/2023)   Hunger Vital Sign    Worried About Running Out of Food in the Last Year: Never true    Ran Out of Food in the Last Year: Never true  Transportation Needs: No Transportation Needs (10/19/2023)   PRAPARE - Administrator, Civil Service (Medical): No    Lack of Transportation (Non-Medical): No  Physical Activity: Insufficiently Active (10/19/2023)   Exercise Vital Sign    Days of Exercise per Week: 3 days    Minutes of Exercise per Session: 30 min  Stress: Stress Concern Present (10/19/2023)   [employer] of Occupational Health - Occupational Stress Questionnaire    Feeling of Stress : To some extent  Social Connections: Moderately Integrated (10/19/2023)   Social Connection and Isolation Panel [NHANES]    Frequency of Communication with Friends and Family: Three times a week    Frequency of Social Gatherings with Friends and Family: Once a week    Attends Religious Services: More than 4 times per year    Active Member of [employer] or Organizations: Yes    Attends Banker Meetings: More than 4 times per year    Marital Status: Widowed    Tobacco Counseling Counseling given: Not Answered    Clinical Intake:  Pre-visit preparation completed: Yes  Pain : No/denies pain     Nutritional Risks: None Diabetes: No  Lab Results  Component Value Date   HGBA1C 5.8 (H) 05/02/2023    HGBA1C 6.1 (H) 10/19/2022   HGBA1C 6.1 (H) 04/29/2022     How often do you need to have someone help you when you read instructions, pamphlets, or other written materials from your doctor or pharmacy?: 1 - Never  Interpreter Needed?: No  Information entered by :: NAllen LPN   Activities of Daily Living     10/19/2023    2:49 PM 10/27/2022    6:31 PM  In your present state of health, do you have any difficulty performing the following activities:  Hearing? 0 0  Vision? 1 0  Comment at night   Difficulty concentrating or making decisions? 1 1  Comment memory at times memory trouble  Walking or climbing stairs? 0 0  Comment trouble coming down   Dressing or bathing? 0 0  Doing errands, shopping? 0 0  Preparing Food and eating ? N N  Using the Toilet? N N  In the past six months, have you accidently leaked urine? Colie Dawes  Comment comes and goes   Do you have problems with loss of bowel control? N Y  Managing your Medications? N N  Managing your Finances? N N  Housekeeping or managing your Housekeeping? Anselm Kingfisher    Patient Care Team: Cleave Curling, MD as PCP - General (Internal Medicine) Sater, Sherida Dimmer, MD (Neurology) Pearson, Vallie J, [facility] (Inactive) (Pharmacist)  I have updated your Care Teams any recent Medical Services you may have received from other providers in the past year.     Assessment:    This is a routine wellness examination for Riniyah.  Hearing/Vision screen Hearing Screening - Comments:: Denies hearing issues Vision Screening - Comments:: Regular eye exams, [facility]   Goals Addressed             This Visit's Progress    Patient Stated       10/19/2023, wants to lose weight       Depression Screen     10/19/2023    3:01 PM 05/02/2023    3:28 PM 10/28/2022    2:06 PM 10/19/2022    9:49 AM 04/29/2022    3:00 PM 10/21/2021    2:46 PM 05/20/2021   12:43 PM  PHQ 2/9 Scores  PHQ - 2 Score 1 0 0 1 0 0 2  PHQ- 9 Score 7 11  12  0  9     Fall Risk     10/19/2023    2:59 PM 05/02/2023    3:28 PM 10/27/2022    6:31 PM 10/19/2022    9:49 AM 10/21/2021    2:45 PM  Fall Risk   Falls in the past year? 1 1 0 0 0  Comment tripped on uneven concrete      Number falls in past yr: 0 1 0 0 0  Injury with Fall? 1 0 0 0 0  Comment hematoma      Risk for fall due to : Medication side effect No Fall Risks Medication side effect No Fall Risks Medication side effect  Follow up Falls evaluation completed;Falls prevention discussed Falls evaluation completed Falls prevention discussed;Education provided;Falls evaluation completed Falls evaluation completed Falls evaluation completed;Education provided;Falls prevention discussed    MEDICARE RISK AT HOME:  Medicare Risk at Home Any stairs in or around the home?: Yes If so, are there any without handrails?: No Home free of loose throw rugs in walkways, pet beds, electrical cords, etc?: Yes Adequate lighting in your home to reduce risk of falls?: Yes Life alert?: No Use of a cane, walker or w/c?: No Grab bars in the bathroom?: Yes Shower chair or bench in shower?: Yes Elevated toilet seat or a handicapped toilet?: Yes  TIMED UP AND GO:  Was the test performed?  No  Cognitive Function: 6CIT completed        10/19/2023    3:02 PM 10/28/2022    2:06 PM 10/21/2021    2:47 PM 10/16/2020    3:07 PM 10/10/2019    2:55 PM  6CIT Screen  What Year? 0 points 0 points 0 points 0 points 0 points  What month? 0 points 0 points 0 points 0 points 0 points  What time? 0 points 0 points 0 points 0 points 0 points  Count back from 20 0 points 0 points 0 points 0 points 0 points  Months in reverse 2 points 2 points 0 points 2 points 2 points  Repeat phrase 4 points 4 points 6 points 2 points 4 points  Total Score 6 points 6 points 6 points 4 points 6 points    Immunizations Immunization History  Administered Date(s) Administered   Tdap 03/21/2012, 04/16/2020  Screening Tests Health  Maintenance  Topic Date Due   Cervical Cancer Screening (HPV/Pap Cotest)  Never done   COVID-19 Vaccine (1) 11/04/2023 (Originally 04/24/1966)   Zoster Vaccines- Shingrix (1 of 2) 01/19/2024 (Originally 04/24/1982)   HIV Screening  05/01/2024 (Originally [birth year]/24/1978)   INFLUENZA VACCINE  12/09/2023   Medicare Annual Wellness (AWV)  10/18/2024   MAMMOGRAM  09/14/2025   DTaP/Tdap/Td (3 - Td or Tdap) 04/16/2030   Colonoscopy  09/29/2033   Hepatitis C Screening  Completed   HPV VACCINES  Aged Out   Meningococcal B Vaccine  Aged Out    Health Maintenance  Health Maintenance Due  Topic Date Due   Cervical Cancer Screening (HPV/Pap Cotest)  Never done   Health Maintenance Items Addressed: Declines vaccines  Additional Screening:  Vision Screening: Recommended annual ophthalmology exams for early detection of glaucoma and other disorders of the eye. Would you like a referral to an eye doctor? No    Dental Screening: Recommended annual dental exams for proper oral hygiene  Community Resource Referral / Chronic Care Management: CRR required this visit?  No   CCM required this visit?  No   Plan:    I have personally reviewed and noted the following in the patient's chart:   Medical and social history Use of alcohol, tobacco or illicit drugs  Current medications and supplements including opioid prescriptions. Patient is currently taking opioid prescriptions. Information provided to patient regarding non-opioid alternatives. Patient advised to discuss non-opioid treatment plan with their provider. Functional ability and status Nutritional status Physical activity Advanced directives List of other physicians Hospitalizations, surgeries, and ER visits in previous 12 months Vitals Screenings to include cognitive, depression, and falls Referrals and appointments  In addition, I have reviewed and discussed with patient certain preventive protocols, quality metrics, and best  practice recommendations. A written personalized care plan for preventive services as well as general preventive health recommendations were provided to patient.   Areatha Beecham, LPN   1/61/0960   After Visit Summary: (MyChart) Due to this being a telephonic visit, the after visit summary with patients personalized plan was offered to patient via MyChart   Notes: Nothing significant to report at this time.

## 2023-10-19 NOTE — Patient Instructions (Signed)
 [name] , Thank you for taking time out of your busy schedule to complete your Annual Wellness Visit with me. I enjoyed our conversation and look forward to speaking with you again next year. I, as well as your care team,  appreciate your ongoing commitment to your health goals. Please review the following plan we discussed and let me know if I can assist you in the future. Your Game plan/ To Do List    Referrals: If you haven't heard from the office you've been referred to, please reach out to them at the phone provided.  N/a Follow up Visits: Next Medicare AWV with our clinical staff: 11/21/2024 at 2:40   Have you seen your provider in the last 6 months (3 months if uncontrolled diabetes)? Yes Next Office Visit with your provider: 10/31/2023 at 2:00  Clinician Recommendations:  Aim for 30 minutes of exercise or brisk walking, 6-8 glasses of water, and 5 servings of fruits and vegetables each day.       This is a list of the screening recommended for you and due dates:  Health Maintenance  Topic Date Due   Pap with HPV screening  Never done   COVID-19 Vaccine (1) 11/04/2023*   Zoster (Shingles) Vaccine (1 of 2) 01/19/2024*   HIV Screening  05/01/2024*   Flu Shot  12/09/2023   Medicare Annual Wellness Visit  10/18/2024   Mammogram  09/14/2025   DTaP/Tdap/Td vaccine (3 - Td or Tdap) 04/16/2030   Colon Cancer Screening  09/29/2033   Hepatitis C Screening  Completed   HPV Vaccine  Aged Out   Meningitis B Vaccine  Aged Out  *Topic was postponed. The date shown is not the original due date.    Advanced directives: (ACP Link)Information on Advanced Care Planning can be found at [place]  Secretary of [facility] Advance Health Care Directives Advance Health Care Directives. [email or web address]  Advance Care Planning is important because it:  [x]  Makes sure you receive the medical care that is consistent with your values, goals, and preferences  [x]  It provides guidance to your family and loved  ones and reduces their decisional burden about whether or not they are making the right decisions based on your wishes.  Follow the link provided in your after visit summary or read over the paperwork we have mailed to you to help you started getting your Advance Directives in place. If you need assistance in completing these, please reach out to us  so that we can help you!  See attachments for Preventive Care and Fall Prevention Tips.

## 2023-10-31 ENCOUNTER — Encounter: Payer: Self-pay | Admitting: Internal Medicine

## 2023-10-31 ENCOUNTER — Ambulatory Visit: Payer: Self-pay | Admitting: Internal Medicine

## 2023-10-31 VITALS — BP 134/82 | HR 86 | Temp 98.2°F | Ht 66.0 in | Wt 206.0 lb

## 2023-10-31 DIAGNOSIS — E782 Mixed hyperlipidemia: Secondary | ICD-10-CM | POA: Diagnosis not present

## 2023-10-31 DIAGNOSIS — J309 Allergic rhinitis, unspecified: Secondary | ICD-10-CM

## 2023-10-31 DIAGNOSIS — G35 Multiple sclerosis: Secondary | ICD-10-CM

## 2023-10-31 DIAGNOSIS — E66811 Obesity, class 1: Secondary | ICD-10-CM

## 2023-10-31 DIAGNOSIS — R7303 Prediabetes: Secondary | ICD-10-CM

## 2023-10-31 DIAGNOSIS — E6609 Other obesity due to excess calories: Secondary | ICD-10-CM

## 2023-10-31 DIAGNOSIS — E559 Vitamin D deficiency, unspecified: Secondary | ICD-10-CM

## 2023-10-31 DIAGNOSIS — Z6833 Body mass index (BMI) 33.0-33.9, adult: Secondary | ICD-10-CM

## 2023-10-31 DIAGNOSIS — R0982 Postnasal drip: Secondary | ICD-10-CM

## 2023-10-31 DIAGNOSIS — I10 Essential (primary) hypertension: Secondary | ICD-10-CM | POA: Diagnosis not present

## 2023-10-31 DIAGNOSIS — F331 Major depressive disorder, recurrent, moderate: Secondary | ICD-10-CM

## 2023-10-31 MED ORDER — RIFAXIMIN 550 MG PO TABS: 550.0000 mg | ORAL_TABLET | Freq: Three times a day (TID) | ORAL | 0 refills | Status: AC

## 2023-10-31 NOTE — Patient Instructions (Signed)
 Hypertension, Adult Hypertension is another name for high blood pressure. High blood pressure forces your heart to work harder to pump blood. This can cause problems over time. There are two numbers in a blood pressure reading. There is a top number (systolic) over a bottom number (diastolic). It is best to have a blood pressure that is below 120/80. What are the causes? The cause of this condition is not known. Some other conditions can lead to high blood pressure. What increases the risk? Some lifestyle factors can make you more likely to develop high blood pressure: Smoking. Not getting enough exercise or physical activity. Being overweight. Having too much fat, sugar, calories, or salt (sodium) in your diet. Drinking too much alcohol. Other risk factors include: Having any of these conditions: Heart disease. Diabetes. High cholesterol. Kidney disease. Obstructive sleep apnea. Having a family history of high blood pressure and high cholesterol. Age. The risk increases with age. Stress. What are the signs or symptoms? High blood pressure may not cause symptoms. Very high blood pressure (hypertensive crisis) may cause: Headache. Fast or uneven heartbeats (palpitations). Shortness of breath. Nosebleed. Vomiting or feeling like you may vomit (nauseous). Changes in how you see. Very bad chest pain. Feeling dizzy. Seizures. How is this treated? This condition is treated by making healthy lifestyle changes, such as: Eating healthy foods. Exercising more. Drinking less alcohol. Your doctor may prescribe medicine if lifestyle changes do not help enough and if: Your top number is above 130. Your bottom number is above 80. Your personal target blood pressure may vary. Follow these instructions at home: Eating and drinking  If told, follow the DASH eating plan. To follow this plan: Fill one half of your plate at each meal with fruits and vegetables. Fill one fourth of your plate  at each meal with whole grains. Whole grains include whole-wheat pasta, brown rice, and whole-grain bread. Eat or drink low-fat dairy products, such as skim milk or low-fat yogurt. Fill one fourth of your plate at each meal with low-fat (lean) proteins. Low-fat proteins include fish, chicken without skin, eggs, beans, and tofu. Avoid fatty meat, cured and processed meat, or chicken with skin. Avoid pre-made or processed food. Limit the amount of salt in your diet to less than 1,500 mg each day. Do not drink alcohol if: Your doctor tells you not to drink. You are pregnant, may be pregnant, or are planning to become pregnant. If you drink alcohol: Limit how much you have to: 0-1 drink a day for women. 0-2 drinks a day for men. Know how much alcohol is in your drink. In the [place] one drink equals one 12 oz bottle of beer (355 mL), one 5 oz glass of wine (148 mL), or one 1 oz glass of hard liquor (44 mL). Lifestyle  Work with your doctor to stay at a healthy weight or to lose weight. Ask your doctor what the best weight is for you. Get at least 30 minutes of exercise that causes your heart to beat faster (aerobic exercise) most days of the week. This may include walking, swimming, or biking. Get at least 30 minutes of exercise that strengthens your muscles (resistance exercise) at least 3 days a week. This may include lifting weights or doing Pilates. Do not smoke or use any products that contain nicotine or tobacco. If you need help quitting, ask your doctor. Check your blood pressure at home as told by your doctor. Keep all follow-up visits. Medicines Take over-the-counter and prescription medicines  only as told by your doctor. Follow directions carefully. Do not skip doses of blood pressure medicine. The medicine does not work as well if you skip doses. Skipping doses also puts you at risk for problems. Ask your doctor about side effects or reactions to medicines that you should watch  for. Contact a doctor if: You think you are having a reaction to the medicine you are taking. You have headaches that keep coming back. You feel dizzy. You have swelling in your ankles. You have trouble with your vision. Get help right away if: You get a very bad headache. You start to feel mixed up (confused). You feel weak or numb. You feel faint. You have very bad pain in your: Chest. Belly (abdomen). You vomit more than once. You have trouble breathing. These symptoms may be an emergency. Get help right away. Call 911. Do not wait to see if the symptoms will go away. Do not drive yourself to the hospital. Summary Hypertension is another name for high blood pressure. High blood pressure forces your heart to work harder to pump blood. For most people, a normal blood pressure is less than 120/80. Making healthy choices can help lower blood pressure. If your blood pressure does not get lower with healthy choices, you may need to take medicine. This information is not intended to replace advice given to you by your health care provider. Make sure you discuss any questions you have with your health care provider. Document Revised: 02/12/2021 Document Reviewed: 02/12/2021 Elsevier Patient Education  2024 [employer].

## 2023-10-31 NOTE — Progress Notes (Signed)
 Grace [name],Grace [name], CMA,acting as a Neurosurgeon for Grace LOISE Slocumb, MD.,have documented all relevant documentation on the behalf of Grace LOISE Slocumb, MD,as directed by  Grace LOISE Slocumb, MD while in the presence of Grace LOISE Slocumb, MD.  Subjective:  Patient ID: Grace [name] , female    DOB: [birth year] , 63 y.o.   MRN: [id number]  Chief Complaint  Patient presents with   Hypertension    Patient presents today for bp, predm & cholesterol follow up. She reports compliance with medications. Denies headache, chest pain & sob.  She complains of bloating & diarrhea. She will also contact GI specialist to let them know of situation.  She also experiences a dry cough. Denies fever/ chills. She took at home covid test Friday, negative result.    Prediabetes   Hyperlipidemia    Hypertension The current episode started more than 1 month ago. The problem has been gradually improving since onset. The problem is controlled. Pertinent negatives include no blurred vision, chest pain, orthopnea or palpitations. Past treatments include calcium channel blockers. The current treatment provides moderate improvement.     Past Medical History:  Diagnosis Date   Allergy    Anxiety    Avascular necrosis (HCC)    Back pain    Cancer (HCC)    Cataract    Had surgery   Chest pain    Chewing difficulty    Chronic fatigue syndrome    Colon polyp    pre-cancerous   Complication of anesthesia    patient states she requires more anesthesia   Depression    Disorder of soft tissue    Diverticulosis    Dizziness    Edema, lower extremity    Encounter for annual health examination 05/02/2023   Essential hypertension, benign 05/12/2023   Fatty liver    Fibromyalgia    GERD (gastroesophageal reflux disease)    Glaucoma 07-2019   From surgery   H/O breast biopsy    pre- cancerous cells   Hand fracture, left    9/87   Heart palpitations    Heart valve disease    History of Holter monitoring     Hyperlipidemia    Insomnia    Joint pain    Lactose intolerance    Lhermitte's syndrome    Migraines    Multiple food allergies    Multiple sclerosis (HCC)    dx in 11/1996   Multiple sclerosis (HCC)    Neuromuscular disorder (HCC)    Neuropathy    Numbness and tingling in hands    Obesity    Optic neuritis    OSA (obstructive sleep apnea)    no longer have OSA since stopped taking a MS medication   Osteoarthritis    Otitis media    Palpitations    Prediabetes    Raynaud's phenomenon    Shortness of breath    Sleep apnea    Due to medication that she no longer takes.   Swallowing difficulty    Vaginitis and vulvovaginitis    Vitamin B12 deficiency    Vitamin D  deficiency      Family History  Problem Relation Age of Onset   Pancreatic cancer Mother    Stroke Mother    Hypertension Mother    Hyperlipidemia Mother    Heart disease Mother    Cancer Mother    Depression Mother    Anxiety disorder Mother    Alcoholism Mother    Alcohol abuse Mother  Arthritis Mother    Colon polyps Father    Diabetes Father    Hypertension Father    Hyperlipidemia Father    Kidney disease Father    Sleep apnea Father    Obesity Father    Arthritis Father    Asthma Father    COPD Father    Heart disease Father    Testicular cancer Brother    Cancer Brother    Early death Brother    Colon cancer Maternal Uncle    Stomach cancer Maternal Uncle    Cancer Maternal Uncle    Pancreatic cancer Paternal Uncle    Cancer Paternal Uncle    Prostate cancer Maternal Grandfather    Cancer Paternal Grandmother    Asthma Paternal Grandfather    COPD Paternal Grandfather    Heart disease Paternal Grandfather    Hypertension Daughter    Anxiety disorder Daughter    ADD / ADHD Son    Anxiety disorder Son    Hypertension Son    Multiple sclerosis Neg Hx    Esophageal cancer Neg Hx      Current Outpatient Medications:    Albuterol-Budesonide (AIRSUPRA ) 90-80 MCG/ACT AERO, Inhale 2  puffs into the lungs every 6 (six) hours as needed., Disp: 10.7 g, Rfl: 2   ALPRAZolam  (XANAX ) 0.5 MG tablet, One po every day prn, Disp: 15 tablet, Rfl: 0   amLODipine  (NORVASC ) 2.5 MG tablet, Take 1 tablet (2.5 mg total) by mouth daily., Disp: 30 tablet, Rfl: 11   amoxicillin  (AMOXIL ) 500 MG capsule, TAKE 4 CAPSULES 1 HOUR PRIOR TO APPT, Disp: , Rfl:    amphetamine -dextroamphetamine (ADDERALL XR) 30 MG 24 hr capsule, Take 1 capsule (30 mg total) by mouth daily., Disp: 30 capsule, Rfl: 0   aspirin  EC 81 MG tablet, Take 81 mg by mouth daily., Disp: , Rfl:    Azelastine  HCl 137 MCG/SPRAY SOLN, PLACE 1 SPRAY INTO BOTH NOSTRILS 2 (TWO) TIMES DAILY. USE IN EACH NOSTRIL AS DIRECTED, Disp: 90 mL, Rfl: 1   baclofen  (LIORESAL ) 10 MG tablet, TAKE 2 TABLETS BY MOUTH 3 TIMES A DAY, Disp: 540 tablet, Rfl: 0   Cholecalciferol (VITAMIN D ) 50 MCG (2000 UT) CAPS, Take 2,000 Units by mouth daily., Disp: , Rfl:    colestipol  (COLESTID ) 1 g tablet, Take 1 tablet (1 g total) by mouth 2 (two) times daily., Disp: 60 tablet, Rfl: 11   Cranberry-Vitamin C-Probiotic (AZO CRANBERRY PO), , Disp: , Rfl:    diphenhydrAMINE  (BENADRYL ) 25 MG tablet, Take 25 mg by mouth 3 (three) times daily. Takes with Hydrocodone , Disp: , Rfl:    doxepin  (SINEQUAN ) 10 MG capsule, Take 1-2 capsules (10-20 mg total) by mouth at bedtime., Disp: 180 capsule, Rfl: 0   famotidine  (PEPCID ) 20 MG tablet, Take 1 tablet (20 mg total) by mouth 2 (two) times daily., Disp: 180 tablet, Rfl: 1   gabapentin  (NEURONTIN ) 300 MG capsule, Take 1 capsule (300 mg total) by mouth 2 (two) times daily., Disp: 180 capsule, Rfl: 3   levocetirizine (XYZAL ) 5 MG tablet, TAKE 1 TABLET BY MOUTH EVERY DAY IN THE EVENING, Disp: 90 tablet, Rfl: 2   Melatonin 10 MG TABS, Take 1 tablet by mouth at bedtime. , Disp: , Rfl:    montelukast  (SINGULAIR ) 10 MG tablet, TAKE 1 TABLET BY MOUTH EVERY DAY, Disp: 90 tablet, Rfl: 2   ondansetron  (ZOFRAN -ODT) 4 MG disintegrating tablet, Take 1  tablet (4 mg total) by mouth every 8 (eight) hours as needed for nausea  or vomiting., Disp: 12 tablet, Rfl: 0   rizatriptan  (MAXALT -MLT) 10 MG disintegrating tablet, Take 1 tablet (10 mg total) by mouth as needed for migraine. May repeat in 2 hours if needed.  Max of 2 tablets in 24 hours, and 10 tablets per month, Disp: 10 tablet, Rfl: 11   tiZANidine  (ZANAFLEX ) 2 MG tablet, TAKE UP TO 4 TABLETS BY MOUTH EVERY DAY, Disp: 360 tablet, Rfl: 1   Vitamin D , Ergocalciferol , (DRISDOL ) 1.25 MG (50000 UNIT) CAPS capsule, ONE CAPSULE TWICE WEEKLY ON TUESDAYS/FRIDAYS, Disp: 24 capsule, Rfl: 2   HYDROcodone  bit-homatropine (HYDROMET) 5-1.5 MG/5ML syrup, Take 5 mLs by mouth every 6 (six) hours as needed., Disp: 120 mL, Rfl: 0   HYDROcodone -acetaminophen  (NORCO/VICODIN) 5-325 MG tablet, Take 1 tablet by mouth 3 (three) times daily as needed for moderate pain (pain score 4-6)., Disp: 270 tablet, Rfl: 0   rifaximin  (XIFAXAN ) 550 MG TABS tablet, Take 1 tablet (550 mg total) by mouth 3 (three) times daily for 14 days., Disp: 42 tablet, Rfl: 0   Allergies  Allergen Reactions   Diclofenac Anaphylaxis   Lamictal [Lamotrigine] Shortness Of Breath, Itching and Swelling    Tongue swells   Mobic [Meloxicam] Anaphylaxis   Ultram  [Tramadol  Hcl] Anaphylaxis   Nefazodone Itching and Swelling    Other reaction(s): Arthralgia (Joint Pain); Throat swelling, mouth sores   Banana    Beef-Derived Drug Products    Betaseron [Interferon Beta-1b] Other (See Comments)    Caused avascular necrosis   Codeine Itching   Copaxone [Glatiramer Acetate] Other (See Comments)    Altered mental status: mental changes, anxiety, motion sickness   Flomax  [Tamsulosin  Hcl] Itching   Gabapentin  Itching    Itching if she takes more than 300mg    Latex     Latex band-aids cause redness and tears your skin   Nitrofurantoin      Causing itching all over, tongue swelling, chest heaviness, swollen lips, cough, rapid HR, swollen eyelids, red  eyes/gums, lips red/white per pt.   Oxcarbazepine  Itching and Other (See Comments)    Dizziness, throat hurts   Pork-Derived Products    Teriflunomide Diarrhea    Hair loss, joint pains, elevated liver enzymes abugio (joint pains and liver issues)   Trazodone  And Nefazodone     Mouth sores, tongue swelling   Tape Rash     Review of Systems  Constitutional: Negative.   HENT:  Positive for postnasal drip.   Eyes:  Negative for blurred vision.  Respiratory:  Positive for cough.   Cardiovascular: Negative.  Negative for chest pain, palpitations and orthopnea.  Neurological: Negative.   Psychiatric/Behavioral: Negative.       Today's Vitals   10/31/23 1405  BP: 134/82  Pulse: 86  Temp: 98.2 F (36.8 C)  SpO2: 98%  Weight: 206 lb (93.4 kg)  Height: 5' 6 (1.676 m)   Body mass index is 33.25 kg/m.  Wt Readings from Last 3 Encounters:  10/31/23 206 lb (93.4 kg)  09/30/23 197 lb (89.4 kg)  09/06/23 197 lb 8 oz (89.6 kg)    BP Readings from Last 3 Encounters:  10/31/23 134/82  09/30/23 129/69  09/06/23 118/78     Objective:  Physical Exam Vitals and nursing note reviewed.  Constitutional:      Appearance: Normal appearance.  HENT:     Head: Normocephalic and atraumatic.     Nose:     Comments: Masked     Mouth/Throat:     Comments: Masked   Cardiovascular:  Rate and Rhythm: Normal rate and regular rhythm.     Heart sounds: Normal heart sounds.  Pulmonary:     Effort: Pulmonary effort is normal.     Breath sounds: Normal breath sounds.   Skin:    General: Skin is warm.   Neurological:     General: No focal deficit present.     Mental Status: She is alert.   Psychiatric:        Mood and Affect: Mood normal.        Behavior: Behavior normal.      Assessment And Plan:  Essential hypertension, benign Assessment & Plan: Chronic, fair control.  No med changes today.  She will continue with amlodipine  2.5mg  nightly. Discussed possible side effects. She  agrees to rto in 2 weeks for NV. An EKG will be performed at that time. Grace [name] will adjust meds as needed. She is encouraged to follow low sodium diet.   Orders: -     CBC -     CMP14+EGFR -     Hemoglobin A1c -     Lipid panel  Prediabetes Assessment & Plan: Previous labs reviewed, her A1c has been elevated in the past. Grace [name] will check an A1c today. Reminded to avoid refined sugars including sugary drinks/foods and processed meats including bacon, sausages and deli meats.    Orders: -     CMP14+EGFR -     Hemoglobin A1c  Mixed hyperlipidemia Assessment & Plan: Chronic, ASCVD risk is 5.5%. she does have family history of heart disease.  She is encouraged to follow a heart healthy lifestyle.   Orders: -     CMP14+EGFR -     TSH -     Lipid panel  Multiple sclerosis (HCC) Assessment & Plan: Chronic, she is followed by Neurology .  Their input is appreciated, most recent OV reviewed.  She is not on any biologic therapy.  - Continue with muscle relaxers prn   Allergic rhinitis with postnasal drip Assessment & Plan: Consider use of Zyrtec nightly vs Norel Ad prn.  - Avoid dairy - Stay well hydrated   Class 1 obesity due to excess calories with serious comorbidity and body mass index (BMI) of 33.0 to 33.9 in adult Assessment & Plan: She has gained 9 lbs since April 2025. She is encouraged to gradually increase her daily activity, aiming for at least 150 minutes of exercise per week.    Return if symptoms worsen or fail to improve.  Patient was given opportunity to ask questions. Patient verbalized understanding of the plan and was able to repeat key elements of the plan. All questions were answered to their satisfaction.   Grace [name], Grace LOISE Slocumb, MD, have reviewed all documentation for this visit. The documentation on 10/31/23 for the exam, diagnosis, procedures, and orders are all accurate and complete.   IF YOU HAVE BEEN REFERRED TO A SPECIALIST, IT MAY TAKE 1-2 WEEKS TO  SCHEDULE/PROCESS THE REFERRAL. IF YOU HAVE NOT HEARD FROM US /SPECIALIST IN TWO WEEKS, PLEASE GIVE US  A CALL AT [phone].   THE PATIENT IS ENCOURAGED TO PRACTICE SOCIAL DISTANCING DUE TO THE COVID-19 PANDEMIC.

## 2023-10-31 NOTE — Addendum Note (Signed)
 Addended by: SUZANN MOM on: 10/31/2023 04:49 PM   Modules accepted: Orders

## 2023-10-31 NOTE — Assessment & Plan Note (Signed)
 Chronic, fair control.  No med changes today.  She will continue with amlodipine  2.5mg  nightly. Discussed possible side effects. She agrees to rto in 2 weeks for NV. An EKG will be performed at that time. I will adjust meds as needed. She is encouraged to follow low sodium diet.

## 2023-11-01 ENCOUNTER — Encounter: Payer: Self-pay | Admitting: Internal Medicine

## 2023-11-01 ENCOUNTER — Ambulatory Visit: Payer: Self-pay | Admitting: Internal Medicine

## 2023-11-01 LAB — CMP14+EGFR
ALT: 57 IU/L — ABNORMAL HIGH (ref 0–32)
AST: 69 IU/L — ABNORMAL HIGH (ref 0–40)
Albumin: 4.3 g/dL (ref 3.8–4.9)
Alkaline Phosphatase: 87 IU/L (ref 44–121)
BUN/Creatinine Ratio: 21 (ref 12–28)
BUN: 14 mg/dL (ref 8–27)
Bilirubin Total: 0.6 mg/dL (ref 0.0–1.2)
CO2: 21 mmol/L (ref 20–29)
Calcium: 9.4 mg/dL (ref 8.7–10.3)
Chloride: 103 mmol/L (ref 96–106)
Creatinine, Ser: 0.68 mg/dL (ref 0.57–1.00)
Globulin, Total: 2.6 g/dL (ref 1.5–4.5)
Glucose: 85 mg/dL (ref 70–99)
Potassium: 3.9 mmol/L (ref 3.5–5.2)
Sodium: 139 mmol/L (ref 134–144)
Total Protein: 6.9 g/dL (ref 6.0–8.5)
eGFR: 100 mL/min/{1.73_m2} (ref >59)

## 2023-11-01 LAB — CBC
Hematocrit: 42.3 % (ref 34.0–46.6)
Hemoglobin: 14.4 g/dL (ref 11.1–15.9)
MCH: 33.1 pg — ABNORMAL HIGH (ref 26.6–33.0)
MCHC: 34 g/dL (ref 31.5–35.7)
MCV: 97 fL (ref 79–97)
Platelets: 288 10*3/uL (ref 150–450)
RBC: 4.35 x10E6/uL (ref 3.77–5.28)
RDW: 12.1 % (ref 11.7–15.4)
WBC: 6.5 10*3/uL (ref 3.4–10.8)

## 2023-11-01 LAB — HEMOGLOBIN A1C
Est. average glucose Bld gHb Est-mCnc: 111 mg/dL
Hgb A1c MFr Bld: 5.5 % (ref 4.8–5.6)

## 2023-11-01 LAB — LIPID PANEL
Chol/HDL Ratio: 2.9 ratio (ref 0.0–4.4)
Cholesterol, Total: 190 mg/dL (ref 100–199)
HDL: 65 mg/dL (ref >39)
LDL Chol Calc (NIH): 97 mg/dL (ref 0–99)
Triglycerides: 162 mg/dL — ABNORMAL HIGH (ref 0–149)
VLDL Cholesterol Cal: 28 mg/dL (ref 5–40)

## 2023-11-01 LAB — TSH: TSH: 2.3 u[IU]/mL (ref 0.450–4.500)

## 2023-11-02 ENCOUNTER — Other Ambulatory Visit: Payer: Self-pay | Admitting: Internal Medicine

## 2023-11-02 ENCOUNTER — Encounter: Payer: Self-pay | Admitting: Internal Medicine

## 2023-11-02 MED ORDER — HYDROCODONE BIT-HOMATROP MBR 5-1.5 MG/5ML PO SOLN: 5.0000 mL | Freq: Four times a day (QID) | ORAL | 0 refills | Status: DC | PRN

## 2023-11-03 ENCOUNTER — Encounter: Payer: Self-pay | Admitting: Neurology

## 2023-11-03 MED ORDER — HYDROCODONE-ACETAMINOPHEN 5-325 MG PO TABS: 1.0000 | ORAL_TABLET | Freq: Three times a day (TID) | ORAL | 0 refills | Status: DC | PRN

## 2023-11-03 NOTE — Telephone Encounter (Signed)
 Last seen on 08/09/23 Follow up scheduled on 03/07/24   Dispensed Days Supply Quantity Provider Pharmacy  HYDROCO/APAP 5-325MG   TAB 07/06/2023 90 [phone] each Sater, Charlie LABOR, MD [facility]...     Rx pending to be signed

## 2023-11-06 NOTE — Assessment & Plan Note (Signed)
 Chronic, ASCVD risk is 5.5%. she does have family history of heart disease.  She is encouraged to follow a heart healthy lifestyle.

## 2023-11-06 NOTE — Assessment & Plan Note (Signed)
 Previous labs reviewed, her A1c has been elevated in the past. I will check an A1c today. Reminded to avoid refined sugars including sugary drinks/foods and processed meats including bacon, sausages and deli meats.

## 2023-11-06 NOTE — Assessment & Plan Note (Signed)
 She has gained 9 lbs since April 2025. She is encouraged to gradually increase her daily activity, aiming for at least 150 minutes of exercise per week.

## 2023-11-06 NOTE — Assessment & Plan Note (Addendum)
 Chronic, she is followed by Neurology .  Their input is appreciated, most recent OV reviewed.  She is not on any biologic therapy.  - Continue with muscle relaxers prn

## 2023-11-06 NOTE — Assessment & Plan Note (Addendum)
 Consider use of Zyrtec nightly vs Norel Ad prn.  - Avoid dairy - Stay well hydrated

## 2023-11-07 ENCOUNTER — Other Ambulatory Visit: Payer: Self-pay | Admitting: *Deleted

## 2023-11-07 MED ORDER — DOXEPIN HCL 10 MG PO CAPS: 10.0000 mg | ORAL_CAPSULE | Freq: Every day | ORAL | 0 refills | Status: DC

## 2023-11-07 NOTE — Telephone Encounter (Signed)
 Last seen on 08/09/23 Follow up scheduled 03/07/24

## 2023-12-02 ENCOUNTER — Other Ambulatory Visit: Payer: Self-pay | Admitting: Internal Medicine

## 2024-01-28 ENCOUNTER — Other Ambulatory Visit: Payer: Self-pay | Admitting: Internal Medicine

## 2024-02-04 ENCOUNTER — Encounter: Payer: Self-pay | Admitting: Neurology

## 2024-02-06 ENCOUNTER — Other Ambulatory Visit: Payer: Self-pay

## 2024-02-06 MED ORDER — RIZATRIPTAN BENZOATE 10 MG PO TBDP: 10.0000 mg | ORAL_TABLET | ORAL | 1 refills | Status: AC | PRN

## 2024-02-06 MED ORDER — DOXEPIN HCL 10 MG PO CAPS: 10.0000 mg | ORAL_CAPSULE | Freq: Every day | ORAL | 0 refills | Status: DC

## 2024-02-06 MED ORDER — HYDROCODONE-ACETAMINOPHEN 5-325 MG PO TABS: 1.0000 | ORAL_TABLET | Freq: Three times a day (TID) | ORAL | 0 refills | Status: DC | PRN

## 2024-02-06 NOTE — Progress Notes (Unsigned)
 Pt Last Seen 08/09/2023 Upcoming Appointment 03/07/2024   Hydrocodone  5-325 11/04/2023 (90 day)

## 2024-02-23 NOTE — Telephone Encounter (Signed)
 Order for wegovy dose escalation placed.

## 2024-03-07 ENCOUNTER — Ambulatory Visit (INDEPENDENT_AMBULATORY_CARE_PROVIDER_SITE_OTHER): Admitting: Neurology

## 2024-03-07 ENCOUNTER — Encounter: Payer: Self-pay | Admitting: Neurology

## 2024-03-07 VITALS — BP 134/68 | HR 91 | Ht 66.0 in | Wt 206.5 lb

## 2024-03-07 DIAGNOSIS — F988 Other specified behavioral and emotional disorders with onset usually occurring in childhood and adolescence: Secondary | ICD-10-CM

## 2024-03-07 DIAGNOSIS — G43009 Migraine without aura, not intractable, without status migrainosus: Secondary | ICD-10-CM

## 2024-03-07 DIAGNOSIS — G35D Multiple sclerosis, unspecified: Secondary | ICD-10-CM

## 2024-03-07 DIAGNOSIS — M545 Low back pain, unspecified: Secondary | ICD-10-CM

## 2024-03-07 DIAGNOSIS — G4700 Insomnia, unspecified: Secondary | ICD-10-CM | POA: Diagnosis not present

## 2024-03-07 DIAGNOSIS — F418 Other specified anxiety disorders: Secondary | ICD-10-CM

## 2024-03-07 DIAGNOSIS — R208 Other disturbances of skin sensation: Secondary | ICD-10-CM | POA: Diagnosis not present

## 2024-03-07 DIAGNOSIS — Z79891 Long term (current) use of opiate analgesic: Secondary | ICD-10-CM | POA: Diagnosis not present

## 2024-03-07 DIAGNOSIS — G8929 Other chronic pain: Secondary | ICD-10-CM

## 2024-03-07 MED ORDER — MIRTAZAPINE 15 MG PO TABS: 15.0000 mg | ORAL_TABLET | Freq: Every day | ORAL | 5 refills | Status: DC

## 2024-03-07 NOTE — Progress Notes (Signed)
 [facility]  PATIENT: Grace [name] DOB: [birth year]  REFERRING DOCTOR OR PCP:  Catheryn Slocumb SOURCE: patient  _________________________________   HISTORICAL  CHIEF COMPLAINT:  Chief Complaint  Patient presents with   Follow-up    Pt in room 10. Alone. Here for MS follow up.    HISTORY OF PRESENT ILLNESS:  Grace [name] is a 63 y.o. woman with relapsing remitting multiple sclerosis.     Update  03/07/2024: She is off a  DMT for MS.  She is neurologically stable.      MRI brain and cervical spine 12/25/2022 was [employer] feels her gait is off balanced and she uses a walker outside.   She needs to use the bannister going downstairs - she feels her hip may give out going downstairs..   She has tingling in her arms and feet, left = right and hands>feet.  Hand grip is mildly reduced.  NCV/EMG had not shown neuropathy.     She has some muscle spasticity, especially at nght.  She takes baclofen /tizandine  combo helps more than either by themselves .    She has urinary urgency. She has had some urge incontinence.   She has both hesitancy and urgency.   She had an allergic reaction to Flomax  and stopped.  No recent UTI  She continues to have vision problems since cataract surgery and complications with elevated IOP.    She falls asleep ok but then wakes p after a couple hours. .   She does take hydrocodone , baclofen , tizanidine , doxepin , melatonin, benadryl  at night.  Temazepam  helped her fall asleep but not stay asleep.     Ambien had helped her some in the past but she is concerned about side effects    She has anxiety and has had a fe panic attacks.  She has more stress due to her daughter.    Fatigue continues to be a problem.    Concerta  had not helped.   She had been on Adderall many years ago and we discussed a retrial.  If it does not help she should stop.  She has pain in the neck, back.  She gets some benefit from gabapentin  and hydrocodone .  She  reports several recent panic attacks and has had more stress and anxiety with issues involving her daughter  She has fatty liver and was placed on Wegovy with weight loss benefit (20 pounds so far).  We discussed weight loss would also benefit her back pain   MS History:   She had transverse myelitis in 1998 and was found to have a cervical cord plaque . She had optic neuritis in 1999. In 2002, she had right-sided numbness. MRIs of the brain show just a couple of nonspecific foci. A cervical spine MRI showed a focus consistent with demyelination at C5. Additionally, there are 2 smaller foci at T2 and T3-T4.  CSF was also abnormal c/w MS.     She was initially placed on Betaseron and then on Copaxone. She was unable to tolerate either of these and has not been on any DMT medication since 2003.   She retried Betaseron but stopped after diagnosis of avascular necrosis requiring hip replacement.  She was on Copaxone for a while.   Of note, she had had only one course of IV steroids prior to that and has had some afterwards.   She had not wanted to go on Tysabri or Gilenya and opted to go on no disease modifying therapy since 2012.  At her last visit on 01/17/2014, she decided to go on Aubagio but due to elevated liver function test stopped..     Most recent MRIs are from earlier this year and showed no changes.  She has been off of all disease modifying therapy since 2018.  IMAGING 02/05/2018: Brain MRI showed Multiple T2/FLAIR hyperintense foci in the hemispheres.  This is a nonspecific finding and could be due to chronic microvascular ischemic changes or 2 demyelination.  None of the foci appears to be acute.  When compared to the MRI dated 07/03/2016, there is no interval change.  There is a normal enhancement pattern.  MRI cervical spine 07/27/2020  three T2 hyperintense foci within the spinal cord, adjacent to C5, C6-C7 and T2.  None of the foci enhance.  The foci adjacent to C5 and T2 were present on the  previous MRI.  The focus adjacent to C6-C7 is not seen on the previous MRI from 2016 and is consistent with a more recent demyelinating plaque associated with her MS. 2.   Degenerative changes at C4-C5 through C6-C7 as detailed above.  There is mild spinal stenosis at C5-C6 and borderline spinal stenosis at C6-C7 but there does not appear to be any nerve root compression.  Degenerative changes at C4-C5 and C6-C7 have slightly progressed compared to the previous MRI from 2016.  MRI Brain 05/17/2021 unchanged compared o 02/03/2018  MRi brain 12/25/2022 showed There are a few T2/FLAIR hyperintense foci in the subcortical white matter.  This is a nonspecific finding and could be due to to chronic microvascular ischemic change, demyelination or sequela of migraine.  None of these appear to be acute.  They do not enhance.  They were present on the previous MRI from 05/17/2021.   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  MRI cervical spine 12/25/2022 showed Three T2 hyperintense foci within the spinal cord adjacent to C5, C6-C7 and T2.  The foci do not enhance and are stable compared to the MRI from 2022.   Stable multilevel degenerative changes as detailed above causing mild spinal stenosis at C4-C5 and C5-C6 and borderline spinal stenosis at C6-C7.  The right neuroforamen is narrowed at these levels but not enough to cause nerve root compression.   REVIEW OF SYSTEMS: Constitutional: No fevers, chills, sweats, or change in appetite.  Has fatigue Eyes: No visual changes, double vision, eye pain Ear, nose and throat: No hearing loss, ear pain, nasal congestion, sore throat Cardiovascular: No chest pain, palpitations Respiratory:  No shortness of breath at rest or with exertion.   No wheezes GastrointestinaI: No nausea, vomiting, diarrhea, abdominal pain, fecal incontinence Genitourinary:  No dysuria, urinary retention or frequency.  No  nocturia. Musculoskeletal:  She reports back pain and pain and some of her joints, especially her knees and hips. Integumentary: No rash, pruritus, skin lesions Neurological: as above.  She reports restless leg, insomnia and excessive daytime sleepiness. Psychiatric: Has had depression and anxiety.  Endocrine: No palpitations, diaphoresis, change in appetite, change in weigh.  He notes heat intolerance and excessive thirst at times. Hematologic/Lymphatic:  No anemia, purpura, petechiae. Allergic/Immunologic: No itchy/runny eyes, nasal congestion, recent allergic reactions, rashes  ALLERGIES: Allergies  Allergen Reactions   Diclofenac Anaphylaxis   Lamictal [Lamotrigine] Shortness Of Breath, Itching and Swelling    Tongue swells   Mobic [Meloxicam] Anaphylaxis   Ultram  [Tramadol  Hcl] Anaphylaxis   Nefazodone Itching and Swelling    Other reaction(s): Arthralgia (Joint  Pain); Throat swelling, mouth sores   Banana    Betaseron [Interferon Beta-1b] Other (See Comments)    Caused avascular necrosis   Bovine (Beef) Protein-Containing Drug Products    Codeine Itching   Copaxone [Glatiramer Acetate] Other (See Comments)    Altered mental status: mental changes, anxiety, motion sickness   Flomax  [Tamsulosin  Hcl] Itching   Gabapentin  Itching    Itching if she takes more than 300mg    Latex     Latex band-aids cause redness and tears your skin   Nitrofurantoin      Causing itching all over, tongue swelling, chest heaviness, swollen lips, cough, rapid HR, swollen eyelids, red eyes/gums, lips red/white per pt.   Oxcarbazepine  Itching and Other (See Comments)    Dizziness, throat hurts   Porcine (Pork) Protein-Containing Drug Products    Teriflunomide Diarrhea    Hair loss, joint pains, elevated liver enzymes abugio (joint pains and liver issues)   Trazodone  And Nefazodone     Mouth sores, tongue swelling   Tape Rash    HOME MEDICATIONS:  Current Outpatient Medications:    mirtazapine  (REMERON) 15 MG tablet, Take 1 tablet (15 mg total) by mouth at bedtime., Disp: 30 tablet, Rfl: 5   Albuterol-Budesonide (AIRSUPRA ) 90-80 MCG/ACT AERO, Inhale 2 puffs into the lungs every 6 (six) hours as needed., Disp: 10.7 g, Rfl: 2   amLODipine  (NORVASC ) 2.5 MG tablet, Take 1 tablet (2.5 mg total) by mouth daily., Disp: 30 tablet, Rfl: 11   amoxicillin  (AMOXIL ) 500 MG capsule, TAKE 4 CAPSULES 1 HOUR PRIOR TO APPT, Disp: , Rfl:    aspirin  EC 81 MG tablet, Take 81 mg by mouth daily., Disp: , Rfl:    Azelastine  HCl 137 MCG/SPRAY SOLN, PLACE 1 SPRAY INTO BOTH NOSTRILS 2 (TWO) TIMES DAILY. USE IN EACH NOSTRIL AS DIRECTED, Disp: 90 mL, Rfl: 1   baclofen  (LIORESAL ) 10 MG tablet, TAKE 2 TABLETS BY MOUTH 3 TIMES A DAY, Disp: 540 tablet, Rfl: 0   Cholecalciferol (VITAMIN D ) 50 MCG (2000 UT) CAPS, Take 2,000 Units by mouth daily., Disp: , Rfl:    Cranberry-Vitamin C-Probiotic (AZO CRANBERRY PO), , Disp: , Rfl:    diphenhydrAMINE  (BENADRYL ) 25 MG tablet, Take 25 mg by mouth 3 (three) times daily. Takes with Hydrocodone , Disp: , Rfl:    famotidine  (PEPCID ) 20 MG tablet, TAKE 1 TABLET BY MOUTH TWICE A DAY, Disp: 180 tablet, Rfl: 1   gabapentin  (NEURONTIN ) 300 MG capsule, Take 1 capsule (300 mg total) by mouth 2 (two) times daily., Disp: 180 capsule, Rfl: 3   HYDROcodone -acetaminophen  (NORCO/VICODIN) 5-325 MG tablet, Take 1 tablet by mouth 3 (three) times daily as needed for moderate pain (pain score 4-6)., Disp: 270 tablet, Rfl: 0   levocetirizine (XYZAL ) 5 MG tablet, TAKE 1 TABLET BY MOUTH EVERY DAY IN THE EVENING, Disp: 90 tablet, Rfl: 2   Melatonin 10 MG TABS, Take 1 tablet by mouth at bedtime. , Disp: , Rfl:    montelukast  (SINGULAIR ) 10 MG tablet, TAKE 1 TABLET BY MOUTH EVERY DAY, Disp: 90 tablet, Rfl: 2   ondansetron  (ZOFRAN -ODT) 4 MG disintegrating tablet, Take 1 tablet (4 mg total) by mouth every 8 (eight) hours as needed for nausea or vomiting., Disp: 12 tablet, Rfl: 0   rizatriptan  (MAXALT -MLT) 10 MG  disintegrating tablet, Take 1 tablet (10 mg total) by mouth as needed for migraine. May repeat in 2 hours if needed.  Max of 2 tablets in 24 hours, and 10 tablets per month, Disp: 10  tablet, Rfl: 1   tiZANidine  (ZANAFLEX ) 2 MG tablet, TAKE UP TO 4 TABLETS BY MOUTH EVERY DAY, Disp: 360 tablet, Rfl: 1   Vitamin D , Ergocalciferol , (DRISDOL ) 1.25 MG (50000 UNIT) CAPS capsule, ONE CAPSULE TWICE WEEKLY ON TUESDAYS/FRIDAYS, Disp: 24 capsule, Rfl: 2   WEGOVY 1 MG/0.5ML SOAJ SQ injection, Inject 1 mg into the skin., Disp: , Rfl:   PAST MEDICAL HISTORY: Past Medical History:  Diagnosis Date   Allergy    Anxiety    Avascular necrosis (HCC)    Back pain    Cancer (HCC)    Cataract    Had surgery   Chest pain    Chewing difficulty    Chronic fatigue syndrome    Colon polyp    pre-cancerous   Complication of anesthesia    patient states she requires more anesthesia   Depression    Disorder of soft tissue    Diverticulosis    Dizziness    Edema, lower extremity    Encounter for annual health examination 05/02/2023   Essential hypertension, benign 05/12/2023   Fatty liver    Fibromyalgia    GERD (gastroesophageal reflux disease)    Glaucoma 07-2019   From surgery   H/O breast biopsy    pre- cancerous cells   Hand fracture, left    9/87   Heart palpitations    Heart valve disease    History of Holter monitoring    Hyperlipidemia    Insomnia    Joint pain    Lactose intolerance    Lhermitte's syndrome    Migraines    Multiple food allergies    Multiple sclerosis    dx in 11/1996   Multiple sclerosis    Neuromuscular disorder (HCC)    Neuropathy    Numbness and tingling in hands    Obesity    Optic neuritis    OSA (obstructive sleep apnea)    no longer have OSA since stopped taking a MS medication   Osteoarthritis    Otitis media    Palpitations    Prediabetes    Raynaud's phenomenon    Shortness of breath    Sleep apnea    Due to medication that she no longer takes.    Swallowing difficulty    Vaginitis and vulvovaginitis    Vitamin B12 deficiency    Vitamin D  deficiency     PAST SURGICAL HISTORY: Past Surgical History:  Procedure Laterality Date   ABDOMINAL HYSTERECTOMY Bilateral 02/12/2016   Procedure: HYSTERECTOMY ABDOMINAL WITH BILATERAL SALPINGO OOPHERECTOMY;  Surgeon: Rosaline Cobble, MD;  Location: [facility];  Service: Gynecology;  Laterality: Bilateral;   BREAST EXCISIONAL BIOPSY     core/left breast   CATARACT EXTRACTION, BILATERAL  06/2019   CHOLECYSTECTOMY  1983   COLONOSCOPY     1997/2001   DILATION AND CURETTAGE OF UTERUS     ENDOMETRIAL ABLATION  2012   EXTRACORPOREAL SHOCK WAVE LITHOTRIPSY Left 05/24/2022   Procedure: LEFT EXTRACORPOREAL SHOCK WAVE LITHOTRIPSY (ESWL);  Surgeon: Cam Morene ORN, MD;  Location: [facility];  Service: Urology;  Laterality: Left;   EYE SURGERY     JOINT REPLACEMENT     LAPAROSCOPIC ABDOMINAL EXPLORATION     ovaries and intestines bond together   LEFT HEART CATHETERIZATION WITH CORONARY ANGIOGRAM N/A 06/21/2011   Procedure: LEFT HEART CATHETERIZATION WITH CORONARY ANGIOGRAM;  Surgeon: Alm ORN Clay, MD;  Location: [facility];  Service: Cardiovascular;  Laterality: N/A;   LUMBAR PUNCTURE  11/01/1996   SALPINGOOPHORECTOMY Bilateral 02/12/2016   Procedure: BILATERAL SALPINGO OOPHORECTOMY;  Surgeon: Rosaline Cobble, MD;  Location: [facility];  Service: Gynecology;  Laterality: Bilateral;   TOTAL HIP ARTHROPLASTY     left hip  6/02  /right hip12/04    FAMILY HISTORY: Family History  Problem Relation Age of Onset   Pancreatic cancer Mother    Stroke Mother    Hypertension Mother    Hyperlipidemia Mother    Heart disease Mother    Cancer Mother    Depression Mother    Anxiety disorder Mother    Alcoholism Mother    Alcohol abuse Mother    Arthritis Mother    Colon polyps Father    Diabetes Father    Hypertension Father    Hyperlipidemia Father    Kidney disease Father     Sleep apnea Father    Obesity Father    Arthritis Father    Asthma Father    COPD Father    Heart disease Father    Testicular cancer Brother    Cancer Brother    Early death Brother    Colon cancer Maternal Uncle    Stomach cancer Maternal Uncle    Cancer Maternal Uncle    Pancreatic cancer Paternal Uncle    Cancer Paternal Uncle    Prostate cancer Maternal Grandfather    Cancer Paternal Grandmother    Asthma Paternal Grandfather    COPD Paternal Grandfather    Heart disease Paternal Grandfather    Hypertension Daughter    Anxiety disorder Daughter    ADD / ADHD Son    Anxiety disorder Son    Hypertension Son    Multiple sclerosis Neg Hx    Esophageal cancer Neg Hx     SOCIAL HISTORY:  Social History   Socioeconomic History   Marital status: Widowed    Spouse name: Not on file   Number of children: Not on file   Years of education: Not on file   Highest education level: Associate degree: occupational, scientist, product/process development, or vocational program  Occupational History   Occupation: disabled  Tobacco Use   Smoking status: Former    Current packs/day: 0.00    Average packs/day: 0.3 packs/day for 3.3 years (0.8 ttl pk-yrs)    Types: Cigarettes    Start date: 06/30/1992    Quit date: 07/01/1995    Years since quitting: 28.7   Smokeless tobacco: Never  Vaping Use   Vaping status: Never Used  Substance and Sexual Activity   Alcohol use: No   Drug use: Yes    Types: Hydrocodone    Sexual activity: Not Currently    Birth control/protection: Abstinence, Post-menopausal, Surgical  Other Topics Concern   Not on file  Social History Narrative   Pt lives alone    Disability    Social Drivers of Health   Financial Resource Strain: Low Risk  (10/30/2023)   Overall Financial Resource Strain (CARDIA)    Difficulty of Paying Living Expenses: Not hard at all  Food Insecurity: No Food Insecurity (10/30/2023)   Hunger Vital Sign    Worried About Running Out of Food in the Last Year:  Never true    Ran Out of Food in the Last Year: Never true  Transportation Needs: No Transportation Needs (10/30/2023)   PRAPARE - Administrator, Civil Service (Medical): No    Lack of Transportation (Non-Medical): No  Physical Activity: Insufficiently Active (10/30/2023)   Exercise Vital Sign    Days  of Exercise per Week: 3 days    Minutes of Exercise per Session: 20 min  Stress: No Stress Concern Present (10/30/2023)   [employer] of Occupational Health - Occupational Stress Questionnaire    Feeling of Stress: Only a little  Recent Concern: Stress - Stress Concern Present (10/19/2023)   [employer] of Occupational Health - Occupational Stress Questionnaire    Feeling of Stress : To some extent  Social Connections: Moderately Integrated (10/30/2023)   Social Connection and Isolation Panel    Frequency of Communication with Friends and Family: Three times a week    Frequency of Social Gatherings with Friends and Family: Once a week    Attends Religious Services: More than 4 times per year    Active Member of [employer] or Organizations: Yes    Attends Banker Meetings: More than 4 times per year    Marital Status: Widowed  Intimate Partner Violence: Not At Risk (10/19/2023)   Humiliation, Afraid, Rape, and Kick questionnaire    Fear of Current or Ex-Partner: No    Emotionally Abused: No    Physically Abused: No    Sexually Abused: No     PHYSICAL EXAM  Vitals:   03/07/24 1541  BP: 134/68  Pulse: 91  Weight: 206 lb 8 oz (93.7 kg)  Height: 5' 6 (1.676 m)    Body mass index is 33.33 kg/m.   General: The patient is well-developed and well-nourished and in no acute distress  Neurologic Exam  Mental status: The patient is alert and oriented x 3 at the time of the examination. The patient has apparent normal recent and remote memory, with an apparently normal attention span and concentration ability.   Speech is normal.  Cranial nerves: Slight  reduced color vision OD.  Extraocular movements are full.  She has altered color vision on the right.  Facial strength and sensation was normal.. No obvious hearing deficits are noted.  Motor:  Muscle bulk is normal.   She has increased muscle tone in the legs, left greater than right.  Strength is 5/5 except for the left 4+/5 left EHL  Sensory: Sensory testing shows reduced touch sensation on the right side and reduced vibration on the right..   Coordination: Cerebellar testing reveals good finger-nose-finger and mildly reduced bilateral heel-to-shin  Gait and station: Station is normal.  The gait is mildly wide and also arthritic.  Tandem gait is poor.  Romberg is negative.  Reflexes: Deep tendon reflexes are symmetric and normal bilaterally.         ASSESSMENT AND PLAN  Multiple sclerosis  Chronic prescription opiate use  Dysesthesia  Insomnia, unspecified type  Depression with anxiety  Migraine without aura and without status migrainosus, not intractable  ADD (attention deficit disorder) without hyperactivity   1.   She will continue off a DMT\.  The most recent MRIs from 2024 was stable compared to 2022.  However, on the 2022 MRI she had 1 new lesion not present on her previous MRI   Recheck in 2026 2    continue baclofen , tizanidine  and gabapentin .  She reduced the baclofen  to 20 mg at night (occ 2/day) 3.   Continue Hydrocodone  5 mg up to 3 times a day for pain.  The PDMP was reviewed and she is not getting opiates from other doctors.  She does not show any drug-seeking behavior.   Check UDS today 4.   Change doxepin  to mirtazepine to help with insomnia.    5.  Continue vit D 6.  She will return to see us  in 6 months or sooner if she has new or worsening neurologic symptoms.    This visit is part of a comprehensive longitudinal care medical relationship regarding the patients primary diagnosis of multiple sclerosis and related concerns.   Graycie Halley A. Vear, MD, PhD  03/07/2024, 4:11 PM Certified in Neurology, Clinical Neurophysiology, Sleep Medicine, Pain Medicine and Neuroimaging  [facility] [address] [place], [place] [address] [phone]

## 2024-03-07 NOTE — Patient Instructions (Signed)
 Can try LIPOFLAVINOID for ringing in ears

## 2024-03-09 LAB — DRUG SCREEN, URINE
Amphetamines, Urine: NEGATIVE ng/mL
Barbiturate screen, urine: NEGATIVE ng/mL
Benzodiazepine Quant, Ur: NEGATIVE ng/mL
Cannabinoid Quant, Ur: NEGATIVE ng/mL
Cocaine (Metab.): NEGATIVE ng/mL
Opiate Quant, Ur: POSITIVE ng/mL — AB
PCP Quant, Ur: NEGATIVE ng/mL

## 2024-03-10 ENCOUNTER — Other Ambulatory Visit: Payer: Self-pay | Admitting: Internal Medicine

## 2024-03-11 ENCOUNTER — Encounter: Payer: Self-pay | Admitting: Internal Medicine

## 2024-03-12 ENCOUNTER — Other Ambulatory Visit: Payer: Self-pay

## 2024-03-12 MED ORDER — LEVOCETIRIZINE DIHYDROCHLORIDE 5 MG PO TABS: 5.0000 mg | ORAL_TABLET | Freq: Every evening | ORAL | 2 refills | Status: AC

## 2024-03-31 ENCOUNTER — Other Ambulatory Visit: Payer: Self-pay | Admitting: Neurology

## 2024-04-01 ENCOUNTER — Encounter: Payer: Self-pay | Admitting: Neurology

## 2024-04-02 ENCOUNTER — Other Ambulatory Visit: Payer: Self-pay

## 2024-04-18 LAB — BASIC METABOLIC PANEL WITH GFR
BUN: 9 (ref 4–21)
CO2: 19 (ref 13–22)
Chloride: 106 (ref 99–108)
Creatinine: 0.9 (ref 0.5–1.1)
Glucose: 89
Potassium: 41 meq/L — AB (ref 3.5–5.1)
Sodium: 144 (ref 137–147)

## 2024-04-18 LAB — HEPATIC FUNCTION PANEL
ALT: 40 U/L — AB (ref 7–35)
AST: 40 — AB (ref 13–35)
Bilirubin, Direct: 0.2 (ref 0.01–0.4)
Bilirubin, Total: 0.6

## 2024-04-18 LAB — COMPREHENSIVE METABOLIC PANEL WITH GFR
Albumin: 4.6 (ref 3.5–5.0)
Calcium: 10.6 (ref 8.7–10.7)
Globulin: 2.6
eGFR: 72

## 2024-05-07 ENCOUNTER — Encounter: Payer: Self-pay | Admitting: Neurology

## 2024-05-09 MED ORDER — HYDROCODONE-ACETAMINOPHEN 5-325 MG PO TABS: 1.0000 | ORAL_TABLET | Freq: Three times a day (TID) | ORAL | 0 refills | Status: AC | PRN

## 2024-05-09 NOTE — Telephone Encounter (Signed)
 Last seen on 03/07/24 Follow up scheduled on 08/14/24   Dispensed Days Supply Quantity Provider Pharmacy  HYDROCODONE -ACETAMIN 5-325 MG 02/06/2024 90 270 each Camara, Amadou, MD [facility] [phone] - G...      Rx pending to be signed

## 2024-05-14 ENCOUNTER — Ambulatory Visit (INDEPENDENT_AMBULATORY_CARE_PROVIDER_SITE_OTHER): Payer: Medicare Other | Admitting: Internal Medicine

## 2024-05-14 ENCOUNTER — Ambulatory Visit: Payer: Self-pay | Admitting: Internal Medicine

## 2024-05-14 VITALS — BP 124/78 | HR 94 | Temp 98.3°F | Ht 66.0 in | Wt 204.4 lb

## 2024-05-14 DIAGNOSIS — E782 Mixed hyperlipidemia: Secondary | ICD-10-CM | POA: Diagnosis not present

## 2024-05-14 DIAGNOSIS — I10 Essential (primary) hypertension: Secondary | ICD-10-CM | POA: Diagnosis not present

## 2024-05-14 DIAGNOSIS — Z6832 Body mass index (BMI) 32.0-32.9, adult: Secondary | ICD-10-CM | POA: Diagnosis not present

## 2024-05-14 DIAGNOSIS — R5383 Other fatigue: Secondary | ICD-10-CM | POA: Diagnosis not present

## 2024-05-14 DIAGNOSIS — Z0000 Encounter for general adult medical examination without abnormal findings: Secondary | ICD-10-CM

## 2024-05-14 DIAGNOSIS — G8929 Other chronic pain: Secondary | ICD-10-CM | POA: Diagnosis not present

## 2024-05-14 DIAGNOSIS — M25512 Pain in left shoulder: Secondary | ICD-10-CM | POA: Diagnosis not present

## 2024-05-14 DIAGNOSIS — R7303 Prediabetes: Secondary | ICD-10-CM

## 2024-05-14 DIAGNOSIS — E6609 Other obesity due to excess calories: Secondary | ICD-10-CM

## 2024-05-14 DIAGNOSIS — K7581 Nonalcoholic steatohepatitis (NASH): Secondary | ICD-10-CM | POA: Diagnosis not present

## 2024-05-14 DIAGNOSIS — F331 Major depressive disorder, recurrent, moderate: Secondary | ICD-10-CM | POA: Insufficient documentation

## 2024-05-14 DIAGNOSIS — G35D Multiple sclerosis, unspecified: Secondary | ICD-10-CM

## 2024-05-14 LAB — POCT URINALYSIS DIPSTICK
Bilirubin, UA: NEGATIVE
Blood, UA: NEGATIVE
Glucose, UA: NEGATIVE
Ketones, UA: NEGATIVE
Nitrite, UA: NEGATIVE
Protein, UA: NEGATIVE
Spec Grav, UA: >=1.03 — AB
Urobilinogen, UA: 0.2 U/dL
pH, UA: 5.5

## 2024-05-14 MED ORDER — CYANOCOBALAMIN 1000 MCG/ML IJ SOLN
1000.0000 ug | Freq: Once | INTRAMUSCULAR | Status: AC
  Administered 2024-05-14: 1000 ug via INTRAMUSCULAR

## 2024-05-14 NOTE — Progress Notes (Signed)
 I,Victoria T Emmitt, CMA,acting as a neurosurgeon for [name], MD.,have documented all relevant documentation on the behalf of [name], MD,as directed by  [name], MD while in the presence of [name], MD.  Subjective:    Patient ID: Grace [name] , female    DOB: 1960-09-20 , 64 y.o.   MRN: [id number]  Chief Complaint  Patient presents with   Annual Exam    Patient presents today for HM, Patient reports compliance with medication. Patient denies any chest pain, SOB, or headaches. Patient has no concerns today.    Hypertension   Prediabetes   Hyperlipidemia    HPI Discussed the use of AI scribe software for clinical note transcription with the patient, who gave verbal consent to proceed.  History of Present Illness Grace [name] is a 65 year old female who presents for an annual physical exam.  She feels run down and experiences tingling, particularly in her left arm. The tingling has worsened over the past month, accompanied by shoulder and back pain, with numbness extending to her pinky and another finger.  She has a history of multiple sclerosis and notes that her feet have always had tingling. She is concerned about her B12 levels due to the tingling and fatigue. She is currently taking aspirin , baclofen  three times a day as needed, famotidine  for reflux, Neurontin  300 mg twice daily as needed, Wegovy for fatty liver, amlodipine  2.5 mg, Maxalt  as needed, and montelukast . She recently stopped taking mirtazapine  due to constipation and vomiting, which she attributes to the combination of mirtazapine  and Wegovy. She is on a prescription for vitamin D  50,000 IU.  She has a history of breast calcifications and underwent a diagnostic mammogram and ultrasound. She has a family history of breast cancer in a cousin and has had genetic testing that indicates a higher risk for breast cancer. She previously had more frequent imaging but is now on an annual  schedule.  She is not sleeping well and feels more tired than usual. She reports that she is not engaging in enough physical activity. She drinks four to five bottles of water a day.  She has a history of elevated calcium levels and liver enzymes, which were slightly high but not significantly elevated. She has been using Benefiber and Maxitrate to manage constipation.  She receives amoxicillin  prior to dental appointments, prescribed by her dentist.   Past Medical History:  Diagnosis Date   Allergy    Anxiety    Avascular necrosis (HCC)    Back pain    Cancer (HCC)    Cataract    Had surgery   Chest pain    Chewing difficulty    Chronic fatigue syndrome    Colon polyp    pre-cancerous   Complication of anesthesia    patient states she requires more anesthesia   Depression    Disorder of soft tissue    Diverticulosis    Dizziness    Edema, lower extremity    Encounter for annual health examination 05/02/2023   Essential hypertension, benign 05/12/2023   Fatty liver    Fibromyalgia    GERD (gastroesophageal reflux disease)    Glaucoma 07-2019   From surgery   H/O breast biopsy    pre- cancerous cells   Hand fracture, left    9/87   Heart palpitations    Heart valve disease    History of Holter monitoring    Hyperlipidemia    Insomnia  Joint pain    Lactose intolerance    Lhermitte's syndrome    Migraines    Multiple food allergies    Multiple sclerosis    dx in 11/1996   Multiple sclerosis    Neuromuscular disorder (HCC)    Neuropathy    Numbness and tingling in hands    Obesity    Optic neuritis    OSA (obstructive sleep apnea)    no longer have OSA since stopped taking a MS medication   Osteoarthritis    Otitis media    Palpitations    Prediabetes    Raynaud's phenomenon    Shortness of breath    Sleep apnea    Due to medication that she no longer takes.   Swallowing difficulty    Vaginitis and vulvovaginitis    Vitamin B12 deficiency     Vitamin D  deficiency      Family History  Problem Relation Age of Onset   Pancreatic cancer Mother    Stroke Mother    Hypertension Mother    Hyperlipidemia Mother    Heart disease Mother    Cancer Mother    Depression Mother    Anxiety disorder Mother    Alcoholism Mother    Alcohol abuse Mother    Arthritis Mother    Colon polyps Father    Diabetes Father    Hypertension Father    Hyperlipidemia Father    Kidney disease Father    Sleep apnea Father    Obesity Father    Arthritis Father    Asthma Father    COPD Father    Heart disease Father    Testicular cancer Brother    Cancer Brother    Early death Brother    Colon cancer Maternal Uncle    Stomach cancer Maternal Uncle    Cancer Maternal Uncle    Pancreatic cancer Paternal Uncle    Cancer Paternal Uncle    Prostate cancer Maternal Grandfather    Cancer Paternal Grandmother    Asthma Paternal Grandfather    COPD Paternal Grandfather    Heart disease Paternal Grandfather    Hypertension Daughter    Anxiety disorder Daughter    ADD / ADHD Son    Anxiety disorder Son    Hypertension Son    Multiple sclerosis Neg Hx    Esophageal cancer Neg Hx      Current Outpatient Medications:    Albuterol-Budesonide (AIRSUPRA ) 90-80 MCG/ACT AERO, Inhale 2 puffs into the lungs every 6 (six) hours as needed., Disp: 10.7 g, Rfl: 2   amLODipine  (NORVASC ) 2.5 MG tablet, Take 1 tablet (2.5 mg total) by mouth daily., Disp: 30 tablet, Rfl: 11   amoxicillin  (AMOXIL ) 500 MG capsule, TAKE 4 CAPSULES 1 HOUR PRIOR TO APPT, Disp: , Rfl:    aspirin  EC 81 MG tablet, Take 81 mg by mouth daily., Disp: , Rfl:    Azelastine  HCl 137 MCG/SPRAY SOLN, PLACE 1 SPRAY INTO BOTH NOSTRILS 2 (TWO) TIMES DAILY. USE IN EACH NOSTRIL AS DIRECTED, Disp: 90 mL, Rfl: 1   baclofen  (LIORESAL ) 10 MG tablet, TAKE 2 TABLETS BY MOUTH 3 TIMES A DAY, Disp: 540 tablet, Rfl: 0   Cholecalciferol (VITAMIN D ) 50 MCG (2000 UT) CAPS, Take 2,000 Units by mouth daily., Disp:  , Rfl:    Cranberry-Vitamin C-Probiotic (AZO CRANBERRY PO), , Disp: , Rfl:    diphenhydrAMINE  (BENADRYL ) 25 MG tablet, Take 25 mg by mouth 3 (three) times daily. Takes with Hydrocodone , Disp: , Rfl:  famotidine  (PEPCID ) 20 MG tablet, TAKE 1 TABLET BY MOUTH TWICE A DAY, Disp: 180 tablet, Rfl: 1   gabapentin  (NEURONTIN ) 300 MG capsule, Take 1 capsule (300 mg total) by mouth 2 (two) times daily., Disp: 180 capsule, Rfl: 3   HYDROcodone -acetaminophen  (NORCO/VICODIN) 5-325 MG tablet, Take 1 tablet by mouth 3 (three) times daily as needed for moderate pain (pain score 4-6)., Disp: 270 tablet, Rfl: 0   levocetirizine (XYZAL ) 5 MG tablet, Take 1 tablet (5 mg total) by mouth every evening., Disp: 90 tablet, Rfl: 2   Melatonin 10 MG TABS, Take 1 tablet by mouth at bedtime. , Disp: , Rfl:    mirtazapine  (REMERON ) 15 MG tablet, Take 1 tablet (15 mg total) by mouth at bedtime., Disp: 30 tablet, Rfl: 5   montelukast  (SINGULAIR ) 10 MG tablet, TAKE 1 TABLET BY MOUTH EVERY DAY, Disp: 90 tablet, Rfl: 2   ondansetron  (ZOFRAN -ODT) 4 MG disintegrating tablet, Take 1 tablet (4 mg total) by mouth every 8 (eight) hours as needed for nausea or vomiting., Disp: 12 tablet, Rfl: 0   rizatriptan  (MAXALT -MLT) 10 MG disintegrating tablet, Take 1 tablet (10 mg total) by mouth as needed for migraine. May repeat in 2 hours if needed.  Max of 2 tablets in 24 hours, and 10 tablets per month, Disp: 10 tablet, Rfl: 1   tiZANidine  (ZANAFLEX ) 2 MG tablet, TAKE UP TO 4 TABLETS BY MOUTH EVERY DAY, Disp: 360 tablet, Rfl: 1   Vitamin D , Ergocalciferol , (DRISDOL ) 1.25 MG (50000 UNIT) CAPS capsule, ONE CAPSULE TWICE WEEKLY ON TUESDAYS/FRIDAYS, Disp: 24 capsule, Rfl: 2   WEGOVY 1 MG/0.5ML SOAJ SQ injection, Inject 1 mg into the skin., Disp: , Rfl:    Allergies  Allergen Reactions   Diclofenac Anaphylaxis   Lamictal [Lamotrigine] Shortness Of Breath, Itching and Swelling    Tongue swells   Mobic [Meloxicam] Anaphylaxis   Ultram  [Tramadol   Hcl] Anaphylaxis   Nefazodone Itching and Swelling    Other reaction(s): Arthralgia (Joint Pain); Throat swelling, mouth sores   Banana    Betaseron [Interferon Beta-1b] Other (See Comments)    Caused avascular necrosis   Bovine (Beef) Protein-Containing Drug Products    Codeine Itching   Copaxone [Glatiramer Acetate] Other (See Comments)    Altered mental status: mental changes, anxiety, motion sickness   Flomax  [Tamsulosin  Hcl] Itching   Gabapentin  Itching    Itching if she takes more than 300mg    Latex     Latex band-aids cause redness and tears your skin   Nitrofurantoin      Causing itching all over, tongue swelling, chest heaviness, swollen lips, cough, rapid HR, swollen eyelids, red eyes/gums, lips red/white per pt.   Oxcarbazepine  Itching and Other (See Comments)    Dizziness, throat hurts   Porcine (Pork) Protein-Containing Drug Products    Teriflunomide Diarrhea    Hair loss, joint pains, elevated liver enzymes abugio (joint pains and liver issues)   Trazodone  And Nefazodone     Mouth sores, tongue swelling   Tape Rash      The patient states she uses post menopausal status for birth control. Patient's last menstrual period was 04/09/2013.. Negative for Dysmenorrhea. Negative for: breast discharge, breast lump(s), breast pain and breast self exam. Associated symptoms include abnormal vaginal bleeding. Pertinent negatives include abnormal bleeding (hematology), anxiety, decreased libido, depression, difficulty falling sleep, dyspareunia, history of infertility, nocturia, sexual dysfunction, sleep disturbances, urinary incontinence, urinary urgency, vaginal discharge and vaginal itching. Diet regular.The patient states her exercise level is    .  The patient's tobacco use is: Tobacco Use History[1]. She has been exposed to passive smoke. The patient's alcohol use is:  Social History   Substance and Sexual Activity  Alcohol Use No    Review of Systems  Constitutional:  Negative.   HENT: Negative.    Eyes: Negative.   Respiratory: Negative.    Cardiovascular: Negative.   Gastrointestinal: Negative.   Endocrine: Negative.   Genitourinary: Negative.   Musculoskeletal:  Positive for arthralgias.  Skin: Negative.   Allergic/Immunologic: Negative.   Neurological:  Positive for numbness.  Hematological: Negative.   Psychiatric/Behavioral: Negative.       Today's Vitals   05/14/24 1405  BP: 124/78  Pulse: 94  Temp: 98.3 F (36.8 C)  SpO2: 98%  Weight: 204 lb 6.4 oz (92.7 kg)  Height: 5' 6 (1.676 m)   Body mass index is 32.99 kg/m.  Wt Readings from Last 3 Encounters:  05/14/24 204 lb 6.4 oz (92.7 kg)  03/07/24 206 lb 8 oz (93.7 kg)  10/31/23 206 lb (93.4 kg)    BP Readings from Last 3 Encounters:  05/14/24 124/78  03/07/24 134/68  10/31/23 134/82    Objective:  Physical Exam Vitals and nursing note reviewed.  Constitutional:      Appearance: Normal appearance. She is obese.  HENT:     Head: Normocephalic and atraumatic.     Right Ear: Tympanic membrane, ear canal and external ear normal.     Left Ear: Tympanic membrane, ear canal and external ear normal.     Nose:     Comments: Masked     Mouth/Throat:     Pharynx: No oropharyngeal exudate or posterior oropharyngeal erythema.     Comments: masked Eyes:     Extraocular Movements: Extraocular movements intact.     Conjunctiva/sclera: Conjunctivae normal.     Pupils: Pupils are equal, round, and reactive to light.  Cardiovascular:     Rate and Rhythm: Normal rate and regular rhythm.     Pulses: Normal pulses.     Heart sounds: Normal heart sounds.  Pulmonary:     Effort: Pulmonary effort is normal.     Breath sounds: Normal breath sounds.  Chest:  Breasts:    Tanner Score is 5.     Right: Normal.     Left: Normal.  Abdominal:     General: Bowel sounds are normal.     Palpations: Abdomen is soft.     Comments: Obese, soft  Genitourinary:    Comments:  deferred Musculoskeletal:        General: Tenderness present.     Cervical back: Normal range of motion and neck supple.     Comments: Pain with movement  Skin:    General: Skin is warm and dry.  Neurological:     General: No focal deficit present.     Mental Status: She is alert and oriented to person, place, and time. Mental status is at baseline.     Cranial Nerves: No cranial nerve deficit.  Psychiatric:        Mood and Affect: Mood normal.        Behavior: Behavior normal.    The patient states she uses post menopausal status for birth control. Last LMP was Patient's last menstrual period was 04/09/2013.. Negative for: breast discharge, breast lump(s), breast pain and breast self exam. Associated symptoms include abnormal vaginal bleeding. Pertinent negatives include abnormal bleeding (hematology), anxiety, decreased libido, depression, difficulty falling sleep, dyspareunia, history of infertility, nocturia, sexual  dysfunction, sleep disturbances, urinary incontinence, urinary urgency, vaginal discharge and vaginal itching. Diet regular.The patient states her exercise level is    . The patient's tobacco use is: Tobacco Use History[2]. She has been exposed to passive smoke. The patient's alcohol use is:  Social History   Substance and Sexual Activity  Alcohol Use No       Assessment And Plan:     Encounter for annual health examination Assessment & Plan: A full exam was performed.  Importance of monthly self breast exams was discussed with the patient.  She is advised to get 30-45 minutes of regular exercise, no less than four to five days per week. Both weight-bearing and aerobic exercises are recommended.  She is advised to follow a healthy diet with at least six fruits/veggies per day, decrease intake of red meat and other saturated fats and to increase fish intake to twice weekly.  Meats/fish should not be fried -- baked, boiled or broiled is preferable. It is also important to  cut back on your sugar intake.  Be sure to read labels - try to avoid anything with added sugar, high fructose corn syrup or other sweeteners.  If you must use a sweetener, you can try stevia or monkfruit.  It is also important to avoid artificially sweetened foods/beverages and diet drinks. Lastly, wear SPF 50 sunscreen on exposed skin and when in direct sunlight for an extended period of time.  Be sure to avoid fast food restaurants and aim for at least 60 ounces of water daily.        Essential hypertension, benign Assessment & Plan: Chronic, fair control.  EKG performed, NSR w/o acute changes.  No med changes today.  She will continue with amlodipine  2.5mg  nightly.  She is encouraged to follow low sodium diet.  - Follow low sodium diet.   Orders: -     CBC -     Lipid panel -     POCT urinalysis dipstick -     Microalbumin / creatinine urine ratio -     EKG 12-Lead  Prediabetes Assessment & Plan: Previous labs reviewed, her A1c has been elevated in the past. I will check an A1c today. Reminded to avoid refined sugars including sugary drinks/foods and processed meats including bacon, sausages and deli meats.    Orders: -     Hemoglobin A1c -     Insulin , random  Mixed hyperlipidemia Assessment & Plan: Chronic, ASCVD risk is 5.5%. she does have family history of heart disease.  She is encouraged to follow a heart healthy lifestyle.    Multiple sclerosis Assessment & Plan: Chronic, she is followed by Neurology .  Their input is appreciated, most recent OV reviewed.  She is not on any biologic therapy.  - Continue with muscle relaxers prn   Metabolic dysfunction-associated steatohepatitis (MASH) Assessment & Plan: MASH managed with [facility]. She is followed by hepatology.  Liver enzymes slightly elevated. Insurance covers [employer]. - Continue [id number] as prescribed by the liver center. - Ordered liver and kidney function tests.   Hypercalcemia -     VITAMIN D  25 Hydroxy (Vit-D  Deficiency, Fractures) -     Protein electrophoresis, serum  Chronic left shoulder pain Assessment & Plan: Chronic shoulder pain with tingling and numbness suggests possible radiculopathy. Differential includes orthopedic issues. - Referred to orthopedic specialist for evaluation.  Orders: -     Ambulatory referral to Orthopedic Surgery  Other fatigue -     Vitamin B12 -  Cyanocobalamin   Class 1 obesity due to excess calories with serious comorbidity and body mass index (BMI) of 32.0 to 32.9 in adult Assessment & Plan: She is encouraged to strive for BMI less than 30 to decrease cardiac risk. Advised to aim for at least 150 minutes of exercise per week.      Return in 6 months (on 11/11/2024), or med check, for 1 year physical. Patient was given opportunity to ask questions. Patient verbalized understanding of the plan and was able to repeat key elements of the plan. All questions were answered to their satisfaction.   I, [name], MD, have reviewed all documentation for this visit. The documentation on 05/14/2024 for the exam, diagnosis, procedures, and orders are all accurate and complete.      [1]  Social History Tobacco Use  Smoking Status Former   Current packs/day: 0.00   Average packs/day: 0.3 packs/day for 3.3 years (0.8 ttl pk-yrs)   Types: Cigarettes   Start date: 06/30/1992   Quit date: 07/01/1995   Years since quitting: 28.9  Smokeless Tobacco Never  [2]  Social History Tobacco Use  Smoking Status Former   Current packs/day: 0.00   Average packs/day: 0.3 packs/day for 3.3 years (0.8 ttl pk-yrs)   Types: Cigarettes   Start date: 06/30/1992   Quit date: 07/01/1995   Years since quitting: 28.9  Smokeless Tobacco Never

## 2024-05-14 NOTE — Patient Instructions (Signed)

## 2024-05-16 LAB — CBC
Hematocrit: 45.4 % (ref 34.0–46.6)
Hemoglobin: 15.7 g/dL (ref 11.1–15.9)
MCH: 32.6 pg (ref 26.6–33.0)
MCHC: 34.6 g/dL (ref 31.5–35.7)
MCV: 94 fL (ref 79–97)
Platelets: 294 x10E3/uL (ref 150–450)
RBC: 4.82 x10E6/uL (ref 3.77–5.28)
RDW: 12.1 % (ref 11.7–15.4)
WBC: 6.3 x10E3/uL (ref 3.4–10.8)

## 2024-05-16 LAB — MICROALBUMIN / CREATININE URINE RATIO
Creatinine, Urine: 197.7 mg/dL
Microalb/Creat Ratio: 8 mg/g{creat} (ref 0–29)
Microalbumin, Urine: 16.2 ug/mL

## 2024-05-16 LAB — PROTEIN ELECTROPHORESIS, SERUM
A/G Ratio: 1.1 (ref 0.7–1.7)
Albumin ELP: 3.9 g/dL (ref 2.9–4.4)
Alpha 1: 0.2 g/dL (ref 0.0–0.4)
Alpha 2: 1.1 g/dL — ABNORMAL HIGH (ref 0.4–1.0)
Beta: 1.4 g/dL — ABNORMAL HIGH (ref 0.7–1.3)
Gamma Globulin: 0.7 g/dL (ref 0.4–1.8)
Globulin, Total: 3.4 g/dL (ref 2.2–3.9)
Total Protein: 7.3 g/dL (ref 6.0–8.5)

## 2024-05-16 LAB — VITAMIN B12: Vitamin B-12: 338 pg/mL (ref 232–1245)

## 2024-05-16 LAB — LIPID PANEL
Chol/HDL Ratio: 3.1 ratio (ref 0.0–4.4)
Cholesterol, Total: 208 mg/dL — ABNORMAL HIGH (ref 100–199)
HDL: 67 mg/dL
LDL Chol Calc (NIH): 109 mg/dL — ABNORMAL HIGH (ref 0–99)
Triglycerides: 189 mg/dL — ABNORMAL HIGH (ref 0–149)
VLDL Cholesterol Cal: 32 mg/dL (ref 5–40)

## 2024-05-16 LAB — HEMOGLOBIN A1C
Est. average glucose Bld gHb Est-mCnc: 111 mg/dL
Hgb A1c MFr Bld: 5.5 % (ref 4.8–5.6)

## 2024-05-16 LAB — VITAMIN D 25 HYDROXY (VIT D DEFICIENCY, FRACTURES): Vit D, 25-Hydroxy: 73.3 ng/mL (ref 30.0–100.0)

## 2024-05-16 LAB — INSULIN, RANDOM: INSULIN: 71.2 u[IU]/mL — ABNORMAL HIGH (ref 2.6–24.9)

## 2024-05-19 ENCOUNTER — Other Ambulatory Visit: Payer: Self-pay | Admitting: Internal Medicine

## 2024-05-20 DIAGNOSIS — K7581 Nonalcoholic steatohepatitis (NASH): Secondary | ICD-10-CM | POA: Insufficient documentation

## 2024-05-20 DIAGNOSIS — G8929 Other chronic pain: Secondary | ICD-10-CM | POA: Insufficient documentation

## 2024-05-20 NOTE — Assessment & Plan Note (Signed)
 Chronic, fair control.  EKG performed, NSR w/o acute changes.  No med changes today.  She will continue with amlodipine  2.5mg  nightly.  She is encouraged to follow low sodium diet.  - Follow low sodium diet.

## 2024-05-20 NOTE — Assessment & Plan Note (Signed)
 Chronic, she is followed by Neurology .  Their input is appreciated, most recent OV reviewed.  She is not on any biologic therapy.  - Continue with muscle relaxers prn

## 2024-05-20 NOTE — Assessment & Plan Note (Signed)
 MASH managed with [facility]. She is followed by hepatology.  Liver enzymes slightly elevated. Insurance covers [employer]. - Continue [id number] as prescribed by the liver center. - Ordered liver and kidney function tests.

## 2024-05-20 NOTE — Assessment & Plan Note (Signed)

## 2024-05-20 NOTE — Assessment & Plan Note (Signed)
 Previous labs reviewed, her A1c has been elevated in the past. I will check an A1c today. Reminded to avoid refined sugars including sugary drinks/foods and processed meats including bacon, sausages and deli meats.

## 2024-05-20 NOTE — Assessment & Plan Note (Signed)
 Chronic, ASCVD risk is 5.5%. she does have family history of heart disease.  She is encouraged to follow a heart healthy lifestyle.

## 2024-05-20 NOTE — Assessment & Plan Note (Signed)
 She is encouraged to strive for BMI less than 30 to decrease cardiac risk. Advised to aim for at least 150 minutes of exercise per week.

## 2024-05-20 NOTE — Assessment & Plan Note (Signed)
 Chronic shoulder pain with tingling and numbness suggests possible radiculopathy. Differential includes orthopedic issues. - Referred to orthopedic specialist for evaluation.

## 2024-05-28 ENCOUNTER — Other Ambulatory Visit: Payer: Self-pay | Admitting: Neurology

## 2024-05-28 ENCOUNTER — Encounter: Payer: Self-pay | Admitting: Neurology

## 2024-05-28 MED ORDER — DOXEPIN HCL 10 MG PO CAPS: ORAL_CAPSULE | ORAL | 11 refills | Status: AC

## 2024-07-26 ENCOUNTER — Ambulatory Visit

## 2024-08-14 ENCOUNTER — Ambulatory Visit: Admitting: Neurology

## 2024-11-20 ENCOUNTER — Ambulatory Visit: Payer: Self-pay | Admitting: Internal Medicine

## 2024-11-21 ENCOUNTER — Ambulatory Visit: Admitting: Internal Medicine

## 2024-11-21 ENCOUNTER — Ambulatory Visit: Payer: Self-pay

## 2025-05-20 ENCOUNTER — Encounter: Payer: Self-pay | Admitting: Internal Medicine
